# Patient Record
Sex: Male | Born: 1945
Health system: Southern US, Community
[De-identification: ages and names within clinical notes are randomized; demographics above are authoritative.]

## PROBLEM LIST (undated history)

## (undated) DIAGNOSIS — R748 Abnormal levels of other serum enzymes: Secondary | ICD-10-CM

## (undated) DIAGNOSIS — E119 Type 2 diabetes mellitus without complications: Secondary | ICD-10-CM

## (undated) DIAGNOSIS — R001 Bradycardia, unspecified: Secondary | ICD-10-CM

## (undated) DIAGNOSIS — M545 Low back pain, unspecified: Secondary | ICD-10-CM

## (undated) DIAGNOSIS — I10 Essential (primary) hypertension: Secondary | ICD-10-CM

## (undated) DIAGNOSIS — R011 Cardiac murmur, unspecified: Secondary | ICD-10-CM

## (undated) DIAGNOSIS — G8929 Other chronic pain: Secondary | ICD-10-CM

## (undated) DIAGNOSIS — I4891 Unspecified atrial fibrillation: Secondary | ICD-10-CM

## (undated) DIAGNOSIS — Z9289 Personal history of other medical treatment: Secondary | ICD-10-CM

## (undated) DIAGNOSIS — I219 Acute myocardial infarction, unspecified: Secondary | ICD-10-CM

## (undated) DIAGNOSIS — R9430 Abnormal result of cardiovascular function study, unspecified: Secondary | ICD-10-CM

## (undated) DIAGNOSIS — IMO0002 Reserved for concepts with insufficient information to code with codable children: Secondary | ICD-10-CM

## (undated) DIAGNOSIS — M1990 Unspecified osteoarthritis, unspecified site: Secondary | ICD-10-CM

## (undated) DIAGNOSIS — J3489 Other specified disorders of nose and nasal sinuses: Secondary | ICD-10-CM

## (undated) DIAGNOSIS — K589 Irritable bowel syndrome without diarrhea: Secondary | ICD-10-CM

## (undated) DIAGNOSIS — I2510 Atherosclerotic heart disease of native coronary artery without angina pectoris: Secondary | ICD-10-CM

## (undated) HISTORY — DX: Reserved for concepts with insufficient information to code with codable children: IMO0002

## (undated) HISTORY — DX: Essential (primary) hypertension: I10

## (undated) HISTORY — DX: Unspecified atrial fibrillation: I48.91

## (undated) HISTORY — PX: LAPAROSCOPIC CHOLECYSTECTOMY: SUR755

## (undated) HISTORY — PX: CATARACT EXTRACTION W/ INTRAOCULAR LENS IMPLANT: SHX1309

## (undated) HISTORY — DX: Abnormal result of cardiovascular function study, unspecified: R94.30

## (undated) HISTORY — DX: Bradycardia, unspecified: R00.1

## (undated) HISTORY — DX: Abnormal levels of other serum enzymes: R74.8

## (undated) HISTORY — DX: Unspecified osteoarthritis, unspecified site: M19.90

## (undated) HISTORY — DX: Personal history of other medical treatment: Z92.89

## (undated) HISTORY — PX: APPENDECTOMY: SHX54

## (undated) SURGERY — Surgical Case
Anesthesia: *Unknown

---

## 1998-01-10 ENCOUNTER — Inpatient Hospital Stay (HOSPITAL_COMMUNITY): Admission: EM | Admit: 1998-01-10 | Discharge: 1998-01-10 | Payer: Self-pay | Admitting: Emergency Medicine

## 1998-01-10 ENCOUNTER — Encounter: Payer: Self-pay | Admitting: Emergency Medicine

## 1998-01-29 ENCOUNTER — Encounter: Payer: Self-pay | Admitting: Cardiology

## 1998-05-14 ENCOUNTER — Emergency Department (HOSPITAL_COMMUNITY): Admission: EM | Admit: 1998-05-14 | Discharge: 1998-05-14 | Payer: Self-pay | Admitting: *Deleted

## 1998-11-28 ENCOUNTER — Emergency Department (HOSPITAL_COMMUNITY): Admission: EM | Admit: 1998-11-28 | Discharge: 1998-11-28 | Payer: Self-pay | Admitting: Emergency Medicine

## 2001-09-11 ENCOUNTER — Encounter (INDEPENDENT_AMBULATORY_CARE_PROVIDER_SITE_OTHER): Payer: Self-pay | Admitting: *Deleted

## 2001-09-11 ENCOUNTER — Observation Stay (HOSPITAL_COMMUNITY): Admission: EM | Admit: 2001-09-11 | Discharge: 2001-09-12 | Payer: Self-pay | Admitting: Emergency Medicine

## 2001-09-11 ENCOUNTER — Encounter: Payer: Self-pay | Admitting: Surgery

## 2003-04-23 ENCOUNTER — Emergency Department (HOSPITAL_COMMUNITY): Admission: EM | Admit: 2003-04-23 | Discharge: 2003-04-24 | Payer: Self-pay | Admitting: Emergency Medicine

## 2004-03-28 ENCOUNTER — Ambulatory Visit: Payer: Self-pay | Admitting: Cardiology

## 2004-04-26 ENCOUNTER — Ambulatory Visit: Payer: Self-pay | Admitting: Cardiology

## 2004-05-10 ENCOUNTER — Ambulatory Visit: Payer: Self-pay

## 2004-05-13 ENCOUNTER — Ambulatory Visit: Payer: Self-pay

## 2004-05-13 ENCOUNTER — Encounter: Payer: Self-pay | Admitting: Cardiology

## 2004-12-05 ENCOUNTER — Ambulatory Visit: Payer: Self-pay | Admitting: Cardiology

## 2005-06-12 ENCOUNTER — Ambulatory Visit: Payer: Self-pay | Admitting: Cardiology

## 2005-08-15 ENCOUNTER — Ambulatory Visit: Payer: Self-pay | Admitting: Cardiology

## 2005-11-17 ENCOUNTER — Ambulatory Visit: Payer: Self-pay

## 2005-11-28 ENCOUNTER — Ambulatory Visit: Payer: Self-pay | Admitting: Cardiology

## 2005-12-05 ENCOUNTER — Ambulatory Visit: Payer: Self-pay | Admitting: Cardiology

## 2006-02-08 ENCOUNTER — Ambulatory Visit: Payer: Self-pay | Admitting: Cardiology

## 2006-02-08 LAB — CONVERTED CEMR LAB
Calcium: 9.2 mg/dL (ref 8.4–10.5)
Chloride: 107 meq/L (ref 96–112)
Creatinine, Ser: 1 mg/dL (ref 0.4–1.5)
GFR calc non Af Amer: 81 mL/min

## 2006-08-02 ENCOUNTER — Ambulatory Visit: Payer: Self-pay | Admitting: Cardiology

## 2007-04-01 ENCOUNTER — Ambulatory Visit: Payer: Self-pay | Admitting: Cardiology

## 2007-05-13 ENCOUNTER — Ambulatory Visit: Payer: Self-pay | Admitting: Cardiology

## 2007-09-30 ENCOUNTER — Ambulatory Visit (HOSPITAL_COMMUNITY): Admission: RE | Admit: 2007-09-30 | Discharge: 2007-09-30 | Payer: Self-pay | Admitting: Surgery

## 2007-11-25 ENCOUNTER — Encounter (INDEPENDENT_AMBULATORY_CARE_PROVIDER_SITE_OTHER): Payer: Self-pay | Admitting: Surgery

## 2007-11-25 ENCOUNTER — Ambulatory Visit (HOSPITAL_COMMUNITY): Admission: RE | Admit: 2007-11-25 | Discharge: 2007-11-25 | Payer: Self-pay | Admitting: Surgery

## 2008-01-09 ENCOUNTER — Ambulatory Visit: Payer: Self-pay | Admitting: Cardiology

## 2008-04-28 LAB — HM COLONOSCOPY

## 2008-07-30 ENCOUNTER — Telehealth: Payer: Self-pay | Admitting: Cardiology

## 2008-11-18 ENCOUNTER — Encounter (INDEPENDENT_AMBULATORY_CARE_PROVIDER_SITE_OTHER): Payer: Self-pay | Admitting: *Deleted

## 2009-01-21 ENCOUNTER — Encounter: Payer: Self-pay | Admitting: Cardiology

## 2009-01-21 DIAGNOSIS — E119 Type 2 diabetes mellitus without complications: Secondary | ICD-10-CM

## 2009-01-21 DIAGNOSIS — I480 Paroxysmal atrial fibrillation: Secondary | ICD-10-CM | POA: Insufficient documentation

## 2009-01-21 DIAGNOSIS — I4891 Unspecified atrial fibrillation: Secondary | ICD-10-CM

## 2009-01-22 ENCOUNTER — Ambulatory Visit: Payer: Self-pay | Admitting: Cardiology

## 2009-01-28 ENCOUNTER — Ambulatory Visit (HOSPITAL_COMMUNITY): Admission: RE | Admit: 2009-01-28 | Discharge: 2009-01-28 | Payer: Self-pay | Admitting: Cardiology

## 2009-01-28 ENCOUNTER — Encounter: Payer: Self-pay | Admitting: Cardiology

## 2009-01-28 ENCOUNTER — Ambulatory Visit: Payer: Self-pay | Admitting: Cardiology

## 2009-01-28 ENCOUNTER — Ambulatory Visit: Payer: Self-pay

## 2009-08-08 ENCOUNTER — Encounter: Payer: Self-pay | Admitting: Cardiology

## 2009-08-12 ENCOUNTER — Encounter: Payer: Self-pay | Admitting: Cardiology

## 2009-09-22 ENCOUNTER — Encounter: Admission: RE | Admit: 2009-09-22 | Discharge: 2009-09-22 | Payer: Self-pay | Admitting: Internal Medicine

## 2010-02-01 NOTE — Assessment & Plan Note (Signed)
Summary: YEARLY/SL  Medications Added CRESTOR 10 MG TABS (ROSUVASTATIN CALCIUM) Take one tablet by mouth daily. METFORMIN HCL 500 MG TABS (METFORMIN HCL) three times a day GLIPIZIDE XL 5 MG XR24H-TAB (GLIPIZIDE) once daily ASPIRIN EC 325 MG TBEC (ASPIRIN) Take one tablet by mouth daily CELEBREX 200 MG CAPS (CELECOXIB) Take 1 tablet by mouth once a day      Allergies Added: NKDA  Visit Type:  Follow-up Primary Provider:  Ellamae Sia, MD  CC:  paroxysmal atrial fibrillation.  History of Present Illness: The patient is seen for followup of paroxysmal atrial fibrillation.  He is on flecainide.  I saw him last January, 2010.  He is doing very well.  His sister currently is hospitalized with an episode of sudden cardiac death with ventricular fibrillation converted by an AED in a shopping store.  She done extremely well.  Etiology of her event is not clear yet.  She does have coronary disease.  Patient has no significant problems.  He knows when he has his atrial fibrillation and he is felt no palpitations.  He does have problem with some pain in his shoulder.  Celebrex can be used in his case.  Current Medications (verified): 1)  Flecainide Acetate 150 Mg Tabs (Flecainide Acetate) .... One and One-Half Tablets Two Times A Day 2)  Metoprolol Succinate 50 Mg Xr24h-Tab (Metoprolol Succinate) .... Take 1 Tablet By Mouth Two Times A Day 3)  Ramipril 5 Mg Caps (Ramipril) .... Take One Capsule By Mouth Daily 4)  Crestor 10 Mg Tabs (Rosuvastatin Calcium) .... Take One Tablet By Mouth Daily. 5)  Metformin Hcl 500 Mg Tabs (Metformin Hcl) .... Three Times A Day 6)  Glipizide Xl 5 Mg Xr24h-Tab (Glipizide) .... Once Daily 7)  Aspirin 81 Mg Tbec (Aspirin) .... Take One Tablet By Mouth Daily  Allergies (verified): No Known Drug Allergies  Past History:  Past Medical History: Last updated: 01/21/2009 hypertension CPK  elevated  with normal MB and normal troponin in the past Nuclear stress  test...5/ 2006...no ischemia Systolic murmur PAF...-rare episodes-sinus rhythm on flecainide-patient prefers not to take Coumadin  PVCs....benign Diabetes cholecystectomy-2009 EF  65%...echo...2000  Review of Systems       Patient denies fever, chills, headache, sweats, rash, change in vision, change in hearing, chest pain, shortness of breath, cough, nausea or vomiting, urinary symptoms.  All other systems are reviewed and are negative.  Vital Signs:  Patient profile:   65 year old male Height:      68 inches Weight:      212 pounds BMI:     32.35 Pulse rate:   59 / minute BP sitting:   158 / 82  (left arm) Cuff size:   regular  Vitals Entered By: Hardin Negus, RMA (January 22, 2009 9:03 AM)  Physical Exam  General:  patient is quite stable and he is somewhat overweight. Head:  head is atraumatic. Eyes:  no xanthelasma. Neck:  no jugular venous distention. Chest Wall:  no chest wall tenderness. Lungs:  lungs are clear cardiovascular is not labored. Heart:  cardiac exam reveals S1-S2.  No clicks or significant murmurs. Abdomen:  abdomen is soft. Msk:  no musculoskeletal deformities. Extremities:  no peripheral edema. Skin:  no skin rashes. Psych:  patient is oriented to person time and place.  Affect is normal.   Impression & Recommendations:  Problem # 1:  * RIGHT SHOULDER ARTHRITIS I will give the patient one month of Celebrex.  I very carefully considered this.  Problem # 2:  ATRIAL FIBRILLATION, PAROXYSMAL (ICD-427.31)  His updated medication list for this problem includes:    Flecainide Acetate 150 Mg Tabs (Flecainide acetate) ..... One and one-half tablets two times a day    Metoprolol Succinate 50 Mg Xr24h-tab (Metoprolol succinate) .Marland Kitchen... Take 1 tablet by mouth two times a day    Aspirin Ec 325 Mg Tbec (Aspirin) .Marland Kitchen... Take one tablet by mouth daily  Orders: EKG w/ Interpretation (93000)  EKG is done today reviewed by me.  He has normal sinus rhythm.   The corrected QT interval is normal.  He has had no significant symptoms.  He does have a CHADS2 score of 2.  This would indicate that he should be on Coumadin.  He does physical work and he does not want to be on Coumadin.  Also at this point it appears that he is not having any paroxysms of atrial fib.  This does not change the Coumadin indication, but I feel more comfortable in his case allowing him not to be on it.  Also dabigatran is now available and I discussed this with him.  We will continue this discussion over time.  Problem # 3:  MURMUR (ICD-785.2)  His updated medication list for this problem includes:    Metoprolol Succinate 50 Mg Xr24h-tab (Metoprolol succinate) .Marland Kitchen... Take 1 tablet by mouth two times a day    Ramipril 5 Mg Caps (Ramipril) .Marland Kitchen... Take one capsule by mouth daily  There is no significant murmur today.  No further workup at this point.  Orders: Echocardiogram (Echo)  Problem # 4:  HYPERTENSION (ICD-401.9)  His updated medication list for this problem includes:    Metoprolol Succinate 50 Mg Xr24h-tab (Metoprolol succinate) .Marland Kitchen... Take 1 tablet by mouth two times a day    Ramipril 5 Mg Caps (Ramipril) .Marland Kitchen... Take one capsule by mouth daily    Aspirin Ec 325 Mg Tbec (Aspirin) .Marland Kitchen... Take one tablet by mouth daily  Systolic blood pressure slightly elevated today.  No change in therapy.  He needs blood pressure follow.  We'll see him back in one year for cardiology followup.  Flecainide level will be checked.  Problem # 5:  * SISTER WITH SUDDEN CARDIAC DEATH As outlined in the history of present illness the patient's sister recently had his episode of sudden cardiac death.  She had successful resuscitation.  She has coronary disease.  Etiology of the event is not clear.  Because the patient has this history in the family now, followup 2-D echo will be done to be sure that there has been no change in that there is no obvious sign of hypertrophic cardiomyopathy.  Patient  Instructions: 1)  Increase Aspirin to 325mg   2)  We have given you a prescription for Celebrex 200mg  daily, future refill will need to come from your primary care MD 3)  Your physician recommends that you return for lab work in: Flecanide level 427.31 4)  Your physician has requested that you have an echocardiogram.  Echocardiography is a painless test that uses sound waves to create images of your heart. It provides your doctor with information about the size and shape of your heart and how well your heart's chambers and valves are working.  This procedure takes approximately one hour. There are no restrictions for this procedure. 5)  Follow up in 1 year Prescriptions: CRESTOR 10 MG TABS (ROSUVASTATIN CALCIUM) Take one tablet by mouth daily.  #90 x 4   Entered by:   Sherri  Bascom Levels, RMA   Authorized by:   Talitha Givens, MD, [facility]   Signed by:   Hardin Negus, RMA on 01/22/2009   Method used:   Electronically to        [facility]* (mail-order)             ,          Ph: [phone]       Fax: [phone]   RxID:   [id number] RAMIPRIL 5 MG CAPS (RAMIPRIL) Take one capsule by mouth daily  #90 x 4   Entered by:   Hardin Negus, RMA   Authorized by:   Talitha Givens, MD, [facility]   Signed by:   Hardin Negus, RMA on 01/22/2009   Method used:   Electronically to        [facility]* (mail-order)             ,          Ph: [phone]       Fax: [phone]   RxID:   [id number] METOPROLOL SUCCINATE 50 MG XR24H-TAB (METOPROLOL SUCCINATE) Take 1 tablet by mouth two times a day  #180 x 4   Entered by:   Hardin Negus, RMA   Authorized by:   Talitha Givens, MD, [facility]   Signed by:   Hardin Negus, RMA on 01/22/2009   Method used:   Electronically to        [facility]* (mail-order)             ,          Ph: [phone]       Fax: [phone]   RxID:   [id number] FLECAINIDE ACETATE 150 MG TABS (FLECAINIDE ACETATE) one and one-half tablets two times a  day  #270 x 4   Entered by:   Hardin Negus, RMA   Authorized by:   Talitha Givens, MD, [facility]   Signed by:   Hardin Negus, RMA on 01/22/2009   Method used:   Electronically to        [facility]* (mail-order)             ,          Ph: [phone]       Fax: [phone]   RxID:   [id number] CELEBREX 200 MG CAPS (CELECOXIB) Take 1 tablet by mouth once a day  #30 x 0   Entered by:   Hardin Negus, RMA   Authorized by:   Talitha Givens, MD, [facility]   Signed by:   Hardin Negus, RMA on 01/22/2009   Method used:   Electronically to        [facility] (retail)       [address]       [place], [place]  [id number]       Ph: [phone]       Fax: [phone]   RxID:   [id number]

## 2010-02-01 NOTE — Miscellaneous (Signed)
  Clinical Lists Changes  Problems: Added new problem of HYPERTENSION (ICD-401.9) Added new problem of * CPK ELEVATION .Marland KitchenNORMAL TROPONIN Added new problem of MURMUR (ICD-785.2) Added new problem of ATRIAL FIBRILLATION, PAROXYSMAL (ICD-427.31) Added new problem of * PVCS Added new problem of DM (ICD-250.00) Observations: Added new observation of PAST MED HX: hypertension CPK  elevated  with normal MB and normal troponin in the past Nuclear stress test...5/ 2006...no ischemia Systolic murmur PAF...-rare episodes-sinus rhythm on flecainide-patient prefers not to take Coumadin  PVCs....benign Diabetes cholecystectomy-2009 EF  65%...echo...2000  (01/21/2009 16:14)       Past History:  Past Medical History: hypertension CPK  elevated  with normal MB and normal troponin in the past Nuclear stress test...5/ 2006...no ischemia Systolic murmur PAF...-rare episodes-sinus rhythm on flecainide-patient prefers not to take Coumadin  PVCs....benign Diabetes cholecystectomy-2009 EF  65%...echo...2000

## 2010-02-01 NOTE — Miscellaneous (Signed)
  Clinical Lists Changes  Observations: Added new observation of PAST MED HX: hypertension CPK  elevated  with normal MB and normal troponin in the past Nuclear stress test...5/ 2006...no ischemia Systolic murmur PAF...-rare episodes-sinus rhythm on flecainide-patient prefers not to take Coumadin  PVCs....benign Diabetes cholecystectomy-2009 EF  65%...echo...2000 LFTs  elevated.... August, 2011... evaluation by primary care.... Crestor held  (08/12/2009 9:06) Added new observation of PRIMARY MD: [name], MD (08/12/2009 9:06)       Past History:  Past Medical History: hypertension CPK  elevated  with normal MB and normal troponin in the past Nuclear stress test...5/ 2006...no ischemia Systolic murmur PAF...-rare episodes-sinus rhythm on flecainide-patient prefers not to take Coumadin  PVCs....benign Diabetes cholecystectomy-2009 EF  65%...echo...2000 LFTs  elevated.... August, 2011... evaluation by primary care.... Crestor held

## 2010-03-28 ENCOUNTER — Other Ambulatory Visit: Payer: Self-pay | Admitting: Cardiology

## 2010-03-31 NOTE — Telephone Encounter (Signed)
[address] °

## 2010-05-17 NOTE — Assessment & Plan Note (Signed)
[facility]                            CARDIOLOGY OFFICE NOTE   NAME:[name], [name] GREEK                       MRN:          [id number]  DATE:05/13/2007                            DOB:          05-17-1945    [name] is stable.  When I saw him last he was having some increased  episodes of atrial fib.  He is now doing much better.  I believe he does  much better when he is on Toprol as opposed to some of the other meds  that have been necessary during the shortage of longer acting beta  blockers.  He is fully active.  I am seeing him today specifically to be  sure that he is doing better, and to be sure that he is stable to make a  trip to [place] for his 40th wedding anniversary which is coming up soon.   PAST MEDICAL HISTORY:   ALLERGIES:  No known drug allergies.   MEDICATIONS:  1. Aspirin.  2. Omega-3.  3. Metformin.  4. Flecainide.  5. Ramipril.  6. Toprol XL.  7. Crestor.   OTHER MEDICAL PROBLEMS:  See the list below.   REVIEW OF SYSTEMS:  He is really doing well.  His review of systems is  negative.   PHYSICAL EXAMINATION:  Blood pressure is 145/84 with a pulse of 60.  The patient is oriented to person, time, and place.  Affect is normal.  HEENT:  Reveals no xanthelasma.  He has normal extraocular  motion.  There are no carotid bruits.  There is no jugular venous distension.  LUNGS:  Clear.  Respiratory effort is not labored.  CARDIAC EXAM:  Reveals an S1 with an S2.  There are no clicks or  significant murmurs.  The abdomen is soft.  He has no peripheral edema.   [name] is stable.  His paroxysmal atrial fibrillation is stable.  No  change in his meds at this time, and I will see him in 6 months.     [name] Bjornstad, MD, [facility]  Electronically Signed    JDK/MedQ  DD: 05/13/2007  DT: 05/13/2007  Job #: [id number]   cc:   Linton Ham. Laney Pastor, M.D.

## 2010-05-17 NOTE — Assessment & Plan Note (Signed)
[name] OFFICE NOTE   NAME:[name], [name] WAHLEN                       MRN:          [id number]  DATE:01/09/2008                            DOB:          [birth year]    [name] is doing well.  I follow him for atrial fib.  I saw him last  in May 2009.  He remains on flecainide.  We have checked his levels over  time and he is well within the therapeutic range.  There is no need to  repeat his study now.  He has not had any significant recurrent  symptoms.   He had his gallbladder removed with a laparoscopic cholecystectomy.  He  feels better in general, but still has some symptoms postsurgery related  to the surgical procedure.  Over time, this will improve.   PAST MEDICAL HISTORY:   ALLERGIES:  No known drug allergies.   MEDICATIONS:  See the flow sheet.   REVIEW OF SYSTEMS:  He is not having any GU symptoms.  He has no fevers,  chills, headaches.  His review of systems otherwise is negative.   PHYSICAL EXAMINATION:  VITAL SIGNS:  Blood pressure 140/86 with a pulse  of 53.  GENERAL:  The patient is oriented to person, time, and place.  Affect is  normal.  HEENT:  No xanthelasma.  He has normal extraocular motion.  NECK:  There are no carotid bruits.  There is no jugular venous  distention.  LUNGS:  Clear.  Respiratory effort is not labored.  CARDIAC:  S1 with an S2.  There are no clicks or significant murmurs.  ABDOMEN:  Soft and recovering from his cholecystectomy.  EXTREMITIES:  He has no peripheral edema.   EKG is done and reviewed by me.  He has sinus rhythm with sinus  bradycardia.   Problems are listed on the prior notes.  He has paroxysmal atrial  fibrillation and is stable on flecainide dosing.  No change in his meds  at this time.  I will see him back in 1 year.     [name], MD, [facility]  Electronically Signed    JDK/MedQ  DD: 01/09/2008  DT: 01/10/2008  Job #: [id number]   cc:    Linton Ham. Laney Pastor, M.D.

## 2010-05-17 NOTE — Op Note (Signed)
NAME:  [name], [name] NO.:  [id number]   MEDICAL RECORD NO.:  [id number]          PATIENT TYPE:  AMB   LOCATION:  DAY                          FACILITY:  [facility]   PHYSICIAN:  Adin Hector, MD     DATE OF BIRTH:  02/02/1945   DATE OF PROCEDURE:  DATE OF DISCHARGE:                               OPERATIVE REPORT   PRIMARY CARE PHYSICIAN:  [name] Fuss, PA-C at [facility].   SURGEON:  Michael Boston, M.D.   ASSISTANT:  Edsel Petrin. Dalbert Batman, M.D.   PREOPERATIVE DIAGNOSES:  1. Symptomatic cholecystolithiasis, possible chronic cholecystitis.  2. Left upper eyelid skin masses x3.  3. Fatty steatohepatosis.   POSTOPERATIVE DIAGNOSIS:  1. Symptomatic cholecystolithiasis, possible chronic cholecystitis.  2. Left upper eyelid skin masses x3.  3. Fatty steatohepatosis.   PROCEDURES PERFORMED:  1. Laparoscopic cholecystectomy.  2. Removal of upper eyelid skin masses x3.   ANESTHESIA:  1. General anesthesia.  2. Local anesthetic and field block around all port sites.   DRAINS:  None.   ESTIMATED BLOOD LOSS:  Less than 10 mL.   COMPLICATIONS:  None apparent.   SPECIMENS:  Normal gallbladder.   INDICATIONS:  [name] is a 65 year old with intermittent bouts of  abdominal pain.  It has been associated with eating.  His evaluation did  show some gallstones, but I was not able to review the film itself.  His  potential diagnosis is not initially suggestive of biliary colic, but  then it still seemed more consistent being postprandial.  His workup was  otherwise negative.  We did a HIDA scan which showed markedly decreased  gallbladder ejection fraction with reproduction of symptoms.  Based on  that, I thought he may benefit from cholecystectomy and offered this to  him.   Anatomy and physiology of hepatobiliary and pancreatic function was  discussed. Pathophysiology of symptomatic cholecystolithiasis with its  natural history risks were  discussed.  I have discussed recommendations  made for laparoscopic cholecystectomy, risks, benefits and alternates.  Questions were answered and he agreed to proceed.  The patient also  noted he had some skin tags on his left eyelid.  They have been getting  bigger in size and bothersome when he tries to open and close his eyes.  He requests excision and I thought this was reasonable.  Technique of  excision was discussed.  Risk, benefits and alternatives discussed and  he agreed to proceed.   OPERATIVE FINDINGS:  His gallbladder wall did not seem particularly  thickened.  He did have some dark flat stones, more consistent with  hemolytic type stones than the classic chalky cholesterol stones.  The  upper eyelid mass is seen most consistent with skin tags (acrochordons).   DESCRIPTION OF PROCEDURE:  Informed consent was confirmed.  The patient  had voided just prior to going to the operating room.  He had sequential  compression devices active during the entire case.  He was positioned  supine with both arms tucked and foot board in place.  He underwent  general anesthesia without any difficulty.  His abdomen was prepped and  draped in a sterile fashion.   A 5 mm port was placed in the right upper quadrant with the patient in  steep reverse Trendelenburg and right-side up.  Camera inspection  revealed no intra-abdominal injury.  Under direct visualization, final  ports were placed in the right flank and through the umbilicus.  An 10  mm port was tunneled through the falciform ligament in the subxiphoid  region.  Camera inspection revealed no intra-abdominal injury.  There  was no strong evidence of any periumbilical hernia as thought possible  on initial physical exam.   The gallbladder fundus was grasped and elevated cephalad.  The liver was  somewhat fatty, but I would not say that it had true cirrhosis.  His  omentum was fatty as well.  It limited our view somewhat.  The patient   was placed in heavier reversed Trendelenburg.  The peritoneum was scored  on the right side of the gallbladder.  This area with scored between the  liver and the peritoneal coverings between the right and left side of  the gallbladder.  During this, a breach was made in the wall gallbladder  medially and there was spillage of bile.  This was aspirated and  controlled.  Another tear was made more at the fundus trying to help  elevate the gallbladder and this was aspirated as well.   Circumferential dissection was done to free the proximal third of the  gallbladder off the liver bed.  I saw excellent candidate for the  anterior branch of the cystic artery.  I put one clip on the gallbladder  side and two clips on the proximal remaining after a good critical view  was noted and this was transected.  There was a small posterior branch  of no significance and that was easily controlled with cautery.  This  left one structure going from the gallbladder infundibulum to the  gallbladder down to the porta.  However, lifting and coming around the  infundibulum there was a tear in the infundibulum and it actually fell  off the cystic duct.  There was spillage of stones.  These were  aspirated with a stone grasper.  Copious irrigation was done with good  removal of bile.   Because he did not have any symptoms of jaundice his liver function  tests were not elevated, and it would be harder to do it through a free-  floating cystic duct stump at this time; Dr. Dalbert Batman & I agreed not to be  more aggressive with the cholangiogram. Therefore,  the cystic stump was  grasped and elevated.  It was ligated using a 0-PDS Endoloop and clips  times three.   The gallbladder was freed from its main attachments on liver bed and  removed inside an EndoCatch bag requiring minimal dilation.  The fascial  defect in the subxiphoid region was too small for my pinkie to pass so I  did not do any more aggressive  closure.   Copious irrigation was done with over 2 liters of saline with clear  return.  Hemostasis was excellent.  The clips were intact on the cystic  duct and arterial stumps.  The capnoperitoneum was evacuated and the  ports were removed.  Again, the fascial defect was small in the  subxiphoid region so I did not more aggressively close it.  The skin was  closed using 4-0 Monocryl stitch.  Sterile dressings were applied.   Attention was turned towards  the left eyelid.  The left eyelid was  prepped with alcohol swabs and draped in a sterile fashion.  The skin  tags were elevated and transected using a 15 blade scalpel as the masses  were very pedunculated.  The smallest  mass could be controlled with  just one touch cautery.  The two small lesions I closed using a 5-0  Prolene interrupted stitch for each site to good result.  Care was made  to justify the skin.  Pressure was held and hemostasis was excellent.  A  cold compress was placed.   The patient was extubated and sent to the recovery room in stable  condition.  I am about to discuss postoperative findings with the  patient's family.      Adin Hector, MD  Electronically Signed     SCG/MEDQ  D:  11/25/2007  T:  11/25/2007  Job:  [id number]   cc:   Tommas Olp  [facility]

## 2010-05-17 NOTE — Assessment & Plan Note (Signed)
[place] OFFICE NOTE   NAME:[name], [name] HANF                       MRN:          [id number]  DATE:08/02/2006                            DOB:          02-Dec-1945    [name] is here for followup.  He has had atrial fibrillation in the  past.  We have carefully made decisions about whether or not he can be  on Coumadin.  Because of the type of work he does, we have followed him  off of Coumadin.  He is on aspirin.  He feels quite well.  He has not  had any significant palpitations.  We had switched him off of Altace to  Lotensin, and he felt poorly with it and stopped it.  We will restart  his Altace for blood pressure control.  At night, his blood pressure is  controlled, but during a busy workday, it is probably elevated as it was  when he got here today.   ALLERGIES:  NO KNOWN DRUG ALLERGIES.   MEDICATIONS:  1. Toprol-XL 50 mg daily.  2. Aspirin 325 mg.  3. Omega-3 fish oil.  4. Vitamins.  5. Flecainide 225 mg p.o. b.i.d.  His Flecainide level has been good      in the past on this dose.   OTHER MEDICAL PROBLEMS:  See the list below.   REVIEW OF SYSTEMS:  Other than feeling poorly when he takes lisinopril,  he is doing very well.  Review of systems otherwise is negative.   PHYSICAL EXAMINATION:  VITAL SIGNS:  Blood pressure 158/86, pulse 64.  His weight is 214 pounds.  He rushed over here from work this afternoon.  HEENT:  There is no xanthelasma.  He has normal extraocular motion.  NECK:  There are no carotid bruits.  There is no jugular venous  distension.  LUNGS:  Clear.  Respiratory effort is not labored.  CARDIAC:  S1 with an S2.  There are no clicks or significant murmurs.  ABDOMEN:  Soft.  EXTREMITIES:  He has no peripheral edema.   LABORATORY DATA:  No labs are done today.   PROBLEMS:  1. Rare paroxysmal atrial fibrillation, stable.  2. Flecainide dosing, and this is stable.  3.  Scattered premature ventricular contractions historically, and this      is stable.  4. Nauseated feeling from Lotensin but able to take ramipril in the      past, and we will restart him on ramipril.  5. Elevated blood pressure.  Hopefully his blood pressure will come      under appropriate control      with the Altace.  We will obtain a fasting lipid profile copy from      his other physicians.  In the past he has had difficulties with      medications, but this needs to be reconsidered.     [name] Bjornstad, MD, [facility]  Electronically Signed    JDK/MedQ  DD: 08/02/2006  DT: 08/03/2006  Job #: [id number]   cc:   [facility]

## 2010-05-17 NOTE — Assessment & Plan Note (Signed)
[place] OFFICE NOTE   NAME:[name], [name]                       MRN:          [id number]  DATE:04/01/2007                            DOB:          1945/08/05    Mr. Wilczak is here for followup.  He does have paroxysmal atrial  fibrillation.  Recently his sugar has been elevated.  He is taking  Crestor.  This has helped his lipids, but it was noted that his  hemoglobin A1c was higher.  He is beginning to lose some weight.  He had  some mild increase in palpitations, but this is stabilizing.   PAST MEDICAL HISTORY:   ALLERGIES:  No known drug allergies.   MEDICATIONS:  Aspirin, Omega 3, vitamin B12, glyburide, metformin,  flecainide, ramipril, Toprol, and Crestor.   OTHER MEDICAL PROBLEMS:  See the list below.   REVIEW OF SYSTEMS:  He is feeling better and has no complaints today.   PHYSICAL EXAMINATION:  VITAL SIGNS:  Blood pressure is 140/85 with a  pulse of 63.  GENERAL:  The patient is oriented to person, time and place.  Affect is  normal.  HEENT:  Reveals no xanthelasma.  He has normal extraocular motion.  NECK:  There are no carotid bruits.  There is no jugular venous  distention.  LUNGS:  Clear.  Respiratory effort is not labored.  CARDIAC:  Exam reveals an S1 with an S2.  There are no clicks or  significant murmurs.  ABDOMEN:  Obese.  He definitely needs to lose weight.  EXTREMITIES:  There is no peripheral edema.   PROBLEMS:  1. Rare paroxysmal atrial fibrillation.  2. Flecainide dosing.  3. Scattered PVCs.  4. Hypertension, being treated.  5. Diabetes.   Over time, he has not been willing to take Coumadin.  I have always been  uncomfortable about this, but with the type of work he does and the  infrequent episodes of atrial fibrillation, I am  hopeful that he will be stable.  I will see him for followup before a  goes away on his trip to be sure that he is stable.     [name], MD, [facility]  Electronically Signed    JDK/MedQ  DD: 04/01/2007  DT: 04/01/2007  Job #: [id number]   cc:   Dr. Laney Pastor, [facility], [place]

## 2010-05-20 NOTE — Assessment & Plan Note (Signed)
[place] OFFICE NOTE   NAME:[name], [name]                       MRN:          [id number]  DATE:08/15/2005                            DOB:          12-31-1945    Mr. Rodelo is feeling very well.  We had increased his flecainide.  His level  had been at 0.12.  Increased him from 200 mg a day to 250, and the level  increased to 0.31, and we then increased him to 150 b.i.d.  I have not  repeated a level at 150 b.i.d.  He feels great and has only very rare  palpitations.  He is going about full activities.  He has no chest pain or  shortness of breath.   He asked me about SBE prophylaxis going forward, and the criteria have  changed, and he no longer needs antibiotics.   ALLERGIES:  No known drug allergies.   MEDICATIONS:  Toprol XL 50 b.i.d., Altace 2.5 b.i.d., aspirin 325, omega-3,  simvastatin and flecainide 150 b.i.d.   OTHER MEDICAL PROBLEMS:  See the complete list of my note of 06/12/2005.   REVIEW OF SYSTEMS:  He is really feeling well, and his review of systems is  negative.   PHYSICAL EXAMINATION:  VITAL SIGNS:  Blood pressure is 130/80.  Pulse is 55.  LUNGS:  Clear.  Respiratory effort is not labored.  HEENT:  No xanthelasmas.  He has normal extraocular motion.  There are no  carotid bruits.  There is no jugular venous distention.  CARDIAC:  S1 with an S2.  He does have a soft systolic murmur.  ABDOMEN:  Soft.  There are no masses or bruits.   Problems are listed in my note 06/12/2005.  1. Systolic murmur.  Over the next year or so we will repeat an echo.  He      had aortic sclerosis that was mild in 2000.  6/7.  Paroxysmal atrial fibrillation and flecainide use.  He is now at 150  b.i.d.  After several months I will see him back, and we will check another  level.  I will see him back for followup.  He is doing very well.                               [name], MD, [facility]    JDK/MedQ  DD:  08/15/2005  DT:  08/16/2005  Job #:  [id number]

## 2010-05-20 NOTE — Assessment & Plan Note (Signed)
[place] OFFICE NOTE   NAME:[name], [name]                       MRN:          [id number]  DATE:11/28/2005                            DOB:          [birth year]    [name] had had some increased palpitations.  We decided to recheck  flecainide level and it was 0.55 and on that basis I had increased his  dose.  He is now up to an equivalent of 450 mg a day.  I reviewed this  carefully with several of our team members.  As long as his level stays  in the therapeutic range this is safe.   On the higher dose of flecainide, he feels the best he has felt in  years.  He has no palpitations.  Some of his recent symptoms were around  the time that he had a very painful flu shot.  It is possible that we  may over time back the dose down.   ALLERGIES:  No known drug allergies.   MEDICATIONS:  1. Toprol XL 50 b.i.d.  2. Altace (to be changed to Lotensin) 20 mg daily.  3. Aspirin 325 mg.  4. Omega 3.  5. Glyburide.  6. Metformin.  7. Flecainide 225 mg b.i.d.   PAST MEDICAL HISTORY:  See the list below.   REVIEW OF SYSTEMS:  The patient has no complaints today and he is doing  well.  His review of systems is negative.   PHYSICAL EXAMINATION:  Weight is up to 216 pounds.  We will talk to him  about this.  Blood pressure is 140/75 with a pulse of  53.  The patient  is oriented to person, time and place and his affect was normal.  HEENT  reveals no xanthelasma.  He has normal extraocular motion.  He has no  jugular venous distention.  There are no carotid bruits.  Lungs are  clear.  Respiratory effort is not labored.  Cardiac exam reveals an S1-  S2.  There are no clicks or significant murmurs.  His abdomen is soft.  He has no masses or bruits.  He has no significant peripheral edema.  He  has good distal pulses.   PROBLEMS:  1. History of mild hypertension:  His blood pressure is top normal and      we will  continue to follow this.  2. History of elevated CPK with a normal MB and normal troponin in the      past.  3. Soft systolic murmur:  This is not heard well today.  4. Questionable history of rheumatic fever in the past.  5. Aortic valve sclerosis.  6. Paroxysm atrial fibrillation.  7. Flecainide dosing:  After very careful consideration and much      review, his ongoing dose will be 1-1/2 tablets 150 mg (225 mg)      b.i.d.  We will obtain a trough level and then we will be in touch      with him.  8. History of surgery for gangrenous appendix in October of 2003.  9. Lipid  abnormalities:  He did not feel well on Simvastatin.  He is      going to have follow-up labs.  10.Diabetes with good treatment.     [name], MD, [facility]  Electronically Signed    JDK/MedQ  DD: 11/28/2005  DT: 11/28/2005  Job #: [id number]   cc:   Laney Pastor, M.D.

## 2010-05-20 NOTE — Op Note (Signed)
TNAMEJAVI, BOLLMAN                         ACCOUNT NO.:  [id number]   MEDICAL RECORD NO.:  [id number]                   PATIENT TYPE:  OBV   LOCATION:  0101                                 FACILITY:  [facility]   PHYSICIAN:  Haywood Lasso, M.D.           DATE OF BIRTH:  14-Oct-1945   DATE OF PROCEDURE:  09/11/2001  DATE OF DISCHARGE:                                 OPERATIVE REPORT   PREOPERATIVE DIAGNOSES:  Acute appendicitis.   POSTOPERATIVE DIAGNOSES:  Acute appendicitis.   OPERATION:  Laparoscopic appendectomy.   SURGEON:  Haywood Lasso, M.D.   ANESTHESIA:  General endotracheal.   CLINICAL HISTORY:  This patient is a 65 year old with a fairly typical  history for acute appendicitis who had a CT scan confirming the diagnosis.   DESCRIPTION OF PROCEDURE:  The patient was seen in the holding area and had  no further questions. He was taken to the operating room and after  satisfactory general endotracheal anesthesia had been obtained, a Foley  catheter was placed. The abdomen was shaved, prepped and draped. I used  0.25% Marcaine for each incision. An umbilical incision was made, the fascia  opened and the peritoneal cavity entered under direct vision. A pursestring  was placed and the Hasson introduced. The abdomen was insufflated to15. The  camera was placed and a 5 mm trocar was placed under direct vision in the  right upper quadrant and a 10/11 in the left lower quadrant. The appendix  was visualized, it was stuck distally somewhat laterally and I could not  really mobilize this up very well but the base of the appendix was readily  visualized. I was able to grasp the appendix just distal to the base and  using the wide scalpel make a small window in the mesentery and then get a  window through the mesentery. At this point, I put the EndoGIA in and  divided the appendix right at its base.   Then using the cutting the appendix as a handle, I was then able to  divide  the mesoappendix with the harmonic scalpel and this went very nicely. We got  the appendix completely freed up and there was on bleeding. The appendix was  placed in the EndoCatch bag and brought out the umbilical port.   The abdomen was resinsufflated, we did a final irrigation, checked for  hemostasis and a final check of the staple line and everything looked okay.  A 5 mm trocar was removed and that site was dry. The 10/11 in the left lower  quadrant  was removed and that site was dried. The umbilical port was removed and the  abdomen deflated. The pursestring was tied down. The skin was closed with 4-  0 Monocryl subcuticular plus Steri-Strips. The patient tolerated the  procedure well. There were no operative complications and all counts were  correct.  Haywood Lasso, M.D.    CJS/MEDQ  D:  09/11/2001  T:  09/12/2001  Job:  [id number]   cc:   [facility], M.D. [facility]

## 2010-05-20 NOTE — Assessment & Plan Note (Signed)
[place] OFFICE NOTE   NAME:[name], [name]                       MRN:          [id number]  DATE:02/08/2006                            DOB:          11/05/1945    [name] has had some palpitations and he called earlier this week.  [name] has had some paroxysmal atrial fibrillation in the past and he  is on Flecainide.  We had increased his dose over time up to 225 mg  b.i.d.  A level had been checked at 0.55 and a level was checked after  the dose increase and it was 0.41.  He has had some mild palpitations,  but it does not sound like bursts of atrial fibrillation.  He has not  had any syncope or presyncope.  There has been no chest pain and no  shortness of breath and he remains quite active.  We decided to bring  him into the office today for further evaluation.  He had a Flecainide  level drawn today to be sure.  It is still pending.   The symptoms that he has had recently occurred mostly at rest and he  feels a brief irregularity which sounds like sensing of his PVCs and  post PVC beats.   ALLERGIES:  No known drug allergies.   MEDICATIONS:  Metoprolol extended release 50 b.i.d., aspirin 325 mg,  Omega-3, Glyburide/Metformin, Flecainide 225 b.i.d., Lisinopril 20.   OTHER MEDICAL PROBLEMS:  See the list on my note of November 28, 2005.   REVIEW OF SYSTEMS:  Other than some mild palpitations, he is feeling  well and he feels better now.   PHYSICAL EXAMINATION:  VITAL SIGNS:  Weight 212 pounds, blood pressure  150/83 with a pulse of 62.  GENERAL:  The patient is oriented to person, time, and place, and affect  is normal.  HEENT:  There is no xanthelasma.  There is normal extraocular motion.  NECK:  There is no jugular venous distention.  He has no carotid bruits.  LUNGS:  Clear.  Respiratory effort is not labored.  CARDIAC:  Exam reveals S1 and S2.  There are no clicks or significant  murmurs.  ABDOMEN:  Soft.  There are no masses or bruits.  EXTREMITIES:  He has no peripheral edema.   EKG done on February 05, 2006, showed normal QT interval.  There are  scattered PVCs.  There is sinus rhythm.   The problems are listed on my prior note of November 28, 2005.  1. Paroxysmal atrial fibrillation, this is stable.  2. Flecainide dosing, this is stable and a level is checked today.  3. Scattered PVCs.  This is kept in mind.  The patient is reassured.      I explained to him how he      could feel post PVC beats.  He feels fine at this time and he will      go back to full activity and I will see him back for follow up.     [name], MD, [facility]  Electronically Signed  JDK/MedQ  DD: 02/08/2006  DT: 02/08/2006  Job #: [id number]   cc:   [facility]

## 2010-06-03 ENCOUNTER — Other Ambulatory Visit: Payer: Self-pay | Admitting: Cardiology

## 2010-10-04 LAB — GLUCOSE, CAPILLARY
Glucose-Capillary: 205 — ABNORMAL HIGH
Glucose-Capillary: 218 — ABNORMAL HIGH

## 2010-10-04 LAB — BASIC METABOLIC PANEL
CO2: 27
Chloride: 106
GFR calc Af Amer: >60
Potassium: 4.8
Sodium: 141

## 2010-10-04 LAB — HEMOGLOBIN AND HEMATOCRIT, BLOOD
HCT: 44.6
Hemoglobin: 15.2

## 2010-10-10 ENCOUNTER — Other Ambulatory Visit: Payer: Self-pay | Admitting: *Deleted

## 2010-10-10 MED ORDER — RAMIPRIL 5 MG PO CAPS: 5.0000 mg | ORAL_CAPSULE | Freq: Every day | ORAL | Status: DC

## 2010-10-10 MED ORDER — METOPROLOL SUCCINATE ER 50 MG PO TB24: 50.0000 mg | ORAL_TABLET | Freq: Two times a day (BID) | ORAL | Status: DC

## 2011-01-03 ENCOUNTER — Other Ambulatory Visit: Payer: Self-pay | Admitting: Cardiology

## 2011-01-13 DIAGNOSIS — R197 Diarrhea, unspecified: Secondary | ICD-10-CM | POA: Diagnosis not present

## 2011-01-16 ENCOUNTER — Ambulatory Visit (INDEPENDENT_AMBULATORY_CARE_PROVIDER_SITE_OTHER): Payer: Medicare Other

## 2011-01-16 DIAGNOSIS — Z23 Encounter for immunization: Secondary | ICD-10-CM | POA: Diagnosis not present

## 2011-01-16 DIAGNOSIS — Z0289 Encounter for other administrative examinations: Secondary | ICD-10-CM | POA: Diagnosis not present

## 2011-01-16 DIAGNOSIS — I10 Essential (primary) hypertension: Secondary | ICD-10-CM

## 2011-01-16 DIAGNOSIS — IMO0001 Reserved for inherently not codable concepts without codable children: Secondary | ICD-10-CM | POA: Diagnosis not present

## 2011-03-04 ENCOUNTER — Telehealth: Payer: Self-pay

## 2011-03-04 NOTE — Telephone Encounter (Signed)
Pt would like to know if we can call him in an anti-inflamitory for his tendonitis.

## 2011-03-05 NOTE — Telephone Encounter (Signed)
PT STATES HE HAS BEEN SEEN FOR THIS ISSUE AND HE WOULD LIKE SOMETHING LIKE CELEBREX. CHART AT [place] [id number]

## 2011-03-05 NOTE — Telephone Encounter (Signed)
LMOM TO CB. NEED TO ASK PT HAVE WE SEEN HIM FOR THIS PROBLEM?

## 2011-03-06 ENCOUNTER — Other Ambulatory Visit: Payer: Self-pay | Admitting: Internal Medicine

## 2011-03-06 MED ORDER — CELECOXIB 200 MG PO CAPS: 200.0000 mg | ORAL_CAPSULE | Freq: Every day | ORAL | Status: AC

## 2011-03-06 NOTE — Telephone Encounter (Signed)
Chart to Dr. Laney Pastor with note for disposition.

## 2011-03-07 NOTE — Telephone Encounter (Signed)
Dr Laney Pastor, it looks like Judson Roch meant for this phone message to be routed to you

## 2011-03-07 NOTE — Telephone Encounter (Signed)
Dr Laney Pastor had taken care of sending in the Rx and asked for pt to be called to notify. LMOM on cell that Rx was sent in.

## 2011-03-10 ENCOUNTER — Telehealth: Payer: Self-pay

## 2011-03-10 DIAGNOSIS — M7070 Other bursitis of hip, unspecified hip: Secondary | ICD-10-CM

## 2011-03-10 DIAGNOSIS — N400 Enlarged prostate without lower urinary tract symptoms: Secondary | ICD-10-CM

## 2011-03-10 DIAGNOSIS — K589 Irritable bowel syndrome without diarrhea: Secondary | ICD-10-CM

## 2011-03-10 DIAGNOSIS — E663 Overweight: Secondary | ICD-10-CM

## 2011-03-10 NOTE — Telephone Encounter (Signed)
Pt would like Dr Laney Pastor to know the rx diphenoxylateatropine has 'given him his life back' and it is a 'miracle drug'!! Pt is having regular bm's during the day (once a.m. And once p.m.) and is not having to stop multiple times during the work day to find a restroom. He is so happy about this outcome!!   Pt would like Dr Laney Pastor to know that he has changed the rx dosage from the recommended 3xdaily to only 1x daily. He says the 3x daily was too much and he finds that just once a day regulates him and has improved his quality of life tremendously. He would like to continue on this rx at a dosage of once daily.  He is so thankful for what this has done for him.  pts best: [phone] or [phone] Pharmacy: [facility] [address] [facility]

## 2011-03-11 DIAGNOSIS — E663 Overweight: Secondary | ICD-10-CM | POA: Insufficient documentation

## 2011-03-11 DIAGNOSIS — M7070 Other bursitis of hip, unspecified hip: Secondary | ICD-10-CM | POA: Insufficient documentation

## 2011-03-11 DIAGNOSIS — K589 Irritable bowel syndrome without diarrhea: Secondary | ICD-10-CM | POA: Insufficient documentation

## 2011-03-11 DIAGNOSIS — N400 Enlarged prostate without lower urinary tract symptoms: Secondary | ICD-10-CM | POA: Insufficient documentation

## 2011-03-11 MED ORDER — DIPHENOXYLATE-ATROPINE 2.5-0.025 MG PO TABS: 1.0000 | ORAL_TABLET | Freq: Every day | ORAL | Status: AC

## 2011-03-11 NOTE — Telephone Encounter (Signed)
Called in Lomotil to [facility] [address], pt notified

## 2011-03-11 NOTE — Telephone Encounter (Signed)
Ordered lomotil since controls IBS

## 2011-03-11 NOTE — Telephone Encounter (Signed)
Spoke with [name] and she verified name and DOB. Chart in your box

## 2011-03-11 NOTE — Telephone Encounter (Signed)
Looks like the wrong bob Lablanc to me

## 2011-03-13 DIAGNOSIS — J019 Acute sinusitis, unspecified: Secondary | ICD-10-CM | POA: Diagnosis not present

## 2011-03-21 DIAGNOSIS — J019 Acute sinusitis, unspecified: Secondary | ICD-10-CM | POA: Diagnosis not present

## 2011-04-05 ENCOUNTER — Encounter: Payer: Self-pay | Admitting: Cardiology

## 2011-04-05 DIAGNOSIS — I10 Essential (primary) hypertension: Secondary | ICD-10-CM | POA: Insufficient documentation

## 2011-04-05 DIAGNOSIS — R9430 Abnormal result of cardiovascular function study, unspecified: Secondary | ICD-10-CM | POA: Insufficient documentation

## 2011-04-05 DIAGNOSIS — R748 Abnormal levels of other serum enzymes: Secondary | ICD-10-CM | POA: Insufficient documentation

## 2011-04-06 ENCOUNTER — Ambulatory Visit (INDEPENDENT_AMBULATORY_CARE_PROVIDER_SITE_OTHER): Payer: Medicare Other | Admitting: Cardiology

## 2011-04-06 ENCOUNTER — Encounter: Payer: Self-pay | Admitting: Cardiology

## 2011-04-06 VITALS — BP 146/70 | HR 49 | Resp 18 | Ht 68.0 in | Wt 205.1 lb

## 2011-04-06 DIAGNOSIS — I10 Essential (primary) hypertension: Secondary | ICD-10-CM

## 2011-04-06 DIAGNOSIS — I498 Other specified cardiac arrhythmias: Secondary | ICD-10-CM

## 2011-04-06 DIAGNOSIS — I4891 Unspecified atrial fibrillation: Secondary | ICD-10-CM | POA: Diagnosis not present

## 2011-04-06 DIAGNOSIS — Z5189 Encounter for other specified aftercare: Secondary | ICD-10-CM | POA: Diagnosis not present

## 2011-04-06 DIAGNOSIS — Z79899 Other long term (current) drug therapy: Secondary | ICD-10-CM

## 2011-04-06 DIAGNOSIS — R079 Chest pain, unspecified: Secondary | ICD-10-CM

## 2011-04-06 DIAGNOSIS — R001 Bradycardia, unspecified: Secondary | ICD-10-CM

## 2011-04-06 MED ORDER — METOPROLOL SUCCINATE ER 50 MG PO TB24: 50.0000 mg | ORAL_TABLET | Freq: Every day | ORAL | Status: DC

## 2011-04-06 NOTE — Progress Notes (Signed)
   HPI Patient is seen in followup atrial fibrillation. He is not having any significant symptoms. He has noted that his resting heart rate is on the slow side. He has not had syncope or presyncope. He takes flecainide for his atrial fib. Over time we have chosen not to use Coumadin because of his work and because he does not have a particularly high risk score. We will consider this over time.  When I saw him last year there was question of a soft murmur. Decision was made to proceed with 2-D echo. The study showed good left jugular function. There were no significant valvular abnormalities.  No Known Allergies  Current Outpatient Prescriptions  Medication Sig Dispense Refill  . flecainide (TAMBOCOR) 150 MG tablet take 1 and 1/2 tablets by mouth twice a day  90 tablet  6  . metoprolol (TOPROL-XL) 50 MG 24 hr tablet Take 1 tablet (50 mg total) by mouth 2 (two) times daily.  60 tablet  6  . ramipril (ALTACE) 5 MG capsule Take 1 capsule (5 mg total) by mouth daily.  30 capsule  5  . celecoxib (CELEBREX) 200 MG capsule Take 1 capsule (200 mg total) by mouth daily.  30 capsule  0    History   Social History  . Marital Status: Married    Spouse Name: N/A    Number of Children: N/A  . Years of Education: N/A   Occupational History  . Not on file.   Social History Main Topics  . Smoking status: Never Smoker   . Smokeless tobacco: Not on file  . Alcohol Use: Not on file  . Drug Use: Not on file  . Sexually Active: Not on file   Other Topics Concern  . Not on file   Social History Narrative  . No narrative on file    No family history on file.  Past Medical History  Diagnosis Date  . Hypertension   . Elevated CPK     CPK elevated with normal MB and normal troponin the past  . Chest pain     Nuclear, 2006, no ischemia  . Atrial fibrillation     Paroxysmal, rare episodes, sinus rhythm on flecainide, patient prefers not to take Coumadin  . Arthritis     Right shoulder  .  Ejection fraction     EF 55-60%,  echo, January, 2011    No past surgical history on file.  ROS Patient denies fever, chills, headache, sweats, rash,Change in vision, change in hearing, chest pain, cough, nausea vomiting, urinary symptoms. He has some sinus symptoms and is following with his primary M.D.  PHYSICAL EXAM Patient is oriented to person time and place. Affect is normal. Head is atraumatic. There is no jugulovenous distention. There are no carotid bruits. Lungs are clear. Respiratory effort is nonlabored. Cardiac exam reveals S1 and S2. There no clicks or significant murmurs. The abdomen is soft. There is no peripheral edema.There no musculoskeletal deformities. There are no skin rashes.  Filed Vitals:   04/06/11 1640  BP: 146/70  Pulse: 49  Resp: 18  Height: 5' 8"  (1.727 m)  Weight: 205 lb 1.9 oz (93.042 kg)   EKG is done today and reviewed by me. There is sinus bradycardia.  ASSESSMENT & PLAN

## 2011-04-06 NOTE — Assessment & Plan Note (Signed)
Patient has not had any recurrent chest pain. No change in therapy.

## 2011-04-06 NOTE — Patient Instructions (Signed)
Your physician wants you to follow-up in: 1 year.   You will receive a reminder letter in the mail two months in advance. If you don't receive a letter, please call our office to schedule the follow-up appointment.  Your physician has recommended you make the following change in your medication: Decrease your Metoprolol to once daily

## 2011-04-06 NOTE — Assessment & Plan Note (Signed)
The patient has resting sinus bradycardia. He is on a beta blocker. We will decrease the beta blocker dose. I will see him back in one year.

## 2011-04-06 NOTE — Assessment & Plan Note (Addendum)
The patient has essentially no significant symptoms. He tolerates Flecainide very well. We will continue To not use Coumadin at this time. A flecainide level will be checked.The patient does have a  CHADS score of 2. He prefers not to be treated with an anticoagulant because of his work. I have agreed to this only because clinically he appears to have very infrequent brief episodes of atrial fibrillation that he can feel.

## 2011-04-06 NOTE — Assessment & Plan Note (Signed)
Blood pressure is controlled. No change in therapy. 

## 2011-04-07 DIAGNOSIS — M25519 Pain in unspecified shoulder: Secondary | ICD-10-CM | POA: Diagnosis not present

## 2011-04-07 DIAGNOSIS — J018 Other acute sinusitis: Secondary | ICD-10-CM | POA: Diagnosis not present

## 2011-04-13 NOTE — Progress Notes (Signed)
Addended by: Janne Napoleon on: 04/13/2011 10:25 AM   Modules accepted: Orders

## 2011-05-27 ENCOUNTER — Other Ambulatory Visit: Payer: Self-pay | Admitting: Cardiology

## 2011-07-28 ENCOUNTER — Other Ambulatory Visit: Payer: Self-pay | Admitting: Cardiology

## 2011-07-28 ENCOUNTER — Ambulatory Visit (INDEPENDENT_AMBULATORY_CARE_PROVIDER_SITE_OTHER): Payer: Medicare Other | Admitting: Internal Medicine

## 2011-07-28 VITALS — BP 160/84 | HR 56 | Temp 98.1°F | Resp 14 | Ht 68.5 in | Wt 199.8 lb

## 2011-07-28 DIAGNOSIS — K589 Irritable bowel syndrome without diarrhea: Secondary | ICD-10-CM

## 2011-07-28 DIAGNOSIS — E663 Overweight: Secondary | ICD-10-CM

## 2011-07-28 DIAGNOSIS — E1365 Other specified diabetes mellitus with hyperglycemia: Secondary | ICD-10-CM

## 2011-07-28 DIAGNOSIS — M25519 Pain in unspecified shoulder: Secondary | ICD-10-CM

## 2011-07-28 DIAGNOSIS — I10 Essential (primary) hypertension: Secondary | ICD-10-CM

## 2011-07-28 DIAGNOSIS — M7580 Other shoulder lesions, unspecified shoulder: Secondary | ICD-10-CM

## 2011-07-28 DIAGNOSIS — E119 Type 2 diabetes mellitus without complications: Secondary | ICD-10-CM

## 2011-07-28 LAB — POCT CBC
Granulocyte percent: 68.4 %G (ref 37–80)
MCH, POC: 30.1 pg (ref 27–31.2)
MID (cbc): 0.4 (ref 0–0.9)
MPV: 8.8 fL (ref 0–99.8)
POC Granulocyte: 4.5 (ref 2–6.9)
POC MID %: 6.8 %M (ref 0–12)
Platelet Count, POC: 258 10*3/uL (ref 142–424)
RBC: 5.31 M/uL (ref 4.69–6.13)
WBC: 6.6 10*3/uL (ref 4.6–10.2)

## 2011-07-28 LAB — POCT GLYCOSYLATED HEMOGLOBIN (HGB A1C): Hemoglobin A1C: 9.5

## 2011-07-28 MED ORDER — DIPHENOXYLATE-ATROPINE 2.5-0.025 MG PO TABS: 1.0000 | ORAL_TABLET | Freq: Four times a day (QID) | ORAL | Status: DC | PRN

## 2011-07-28 MED ORDER — GLYBURIDE-METFORMIN 5-500 MG PO TABS: 1.0000 | ORAL_TABLET | Freq: Two times a day (BID) | ORAL | Status: DC

## 2011-07-28 NOTE — Progress Notes (Signed)
Subjective:    Patient ID: [name], male    DOB: 10-28-1945, 66 y.o.   MRN: [id number]  HPIIBS-Symptoms gradually improved and in control with Lomotil/Danny's refill/he was supposed that this might be secondary to Glucophage over these many years but switching to Januvia made no difference He has been able to reduce his Lomotil to one tablet a day or even every other day at times but now needs refills/colonoscopy up-to-date and normal  AODM-Admits that he has discontinued his medicines over the last several weeks/3 started leftover medicines one week ago  Shoulder pain-In the right has recurred over the last 3 weeks and now wakes him at night He has a past history of recurrent rotator cuff tendinitis which often responds easily to injection/his last injection was one year ago He would like to repeat an injection so he can play an upcoming golf to.   pmh- Patient Active Problem List  Diagnosis  . DM  . ATRIAL FIBRILLATION, PAROXYSMAL  . BPH (benign prostatic hyperplasia)  . IBS (irritable bowel syndrome)  . Overweight  . Bursitis of hip  . Elevated CPK  . Chest pain  . Hypertension  . Ejection fraction  . Bradycardia    Review of SystemsNo fever chills Unfortunately no weight loss No chest pain or palpitations/continues on flecainide to control  arrhythmia     Objective:   Physical Exam Filed Vitals:   07/28/11 1530  BP: 160/84  Pulse: 56  Temp: 98.1 F (36.7 C)  Resp: 14   Continues overweight HEENT clear Heart regular without murmur/no peripheral edema Right shoulder is tender over the upper deltoid with marked pain on abduction past 45. He also has Pain with resisted abduction/adduction is full There no peripheral neurologic symptoms in his lower extremities   Procedure= after informed consent his right shoulder was prepped with Betadine and injected with 3 cc of lidocaine +1 cc of the depo Medrol 80 mg  This was accomplished without difficulty       Results for orders placed in visit on 07/28/11  POCT CBC      Component Value Range   WBC 6.6  4.6 - 10.2 K/uL   Lymph, poc 1.6  0.6 - 3.4   POC LYMPH PERCENT 24.8  10 - 50 %L   MID (cbc) 0.4  0 - 0.9   POC MID % 6.8  0 - 12 %M   POC Granulocyte 4.5  2 - 6.9   Granulocyte percent 68.4  37 - 80 %G   RBC 5.31  4.69 - 6.13 M/uL   Hemoglobin 16.0  14.1 - 18.1 g/dL   HCT, POC 51.1  43.5 - 53.7 %   MCV 96.3  80 - 97 fL   MCH, POC 30.1  27 - 31.2 pg   MCHC 31.3 (*) 31.8 - 35.4 g/dL   RDW, POC 13.2     Platelet Count, POC 258  142 - 424 K/uL   MPV 8.8  0 - 99.8 fL  POCT GLYCOSYLATED HEMOGLOBIN (HGB A1C)      Component Value Range   Hemoglobin A1C 9.5      Assessment & Plan:  Problem #1 uncontrolled diabetes Problem #2 obesity Problem #3 uncontrolled hypertension Problem #4 recurrent rotator cuff tendinitis on the right Problem #5 irritable bowel syndrome-barium boarded him to control his symptoms 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  He understands his problems with noncompliance/promises to be better/      . diphenoxylate-atropine (LOMOTIL) 2.5-0.025 MG per tablet    Sig: Take 1 tablet by mouth 4 (four) times daily as needed.    Dispense:  60 tablet    Refill:  3  . glyBURIDE-metformin (GLUCOVANCE) 5-500 MG per tablet    Sig: Take 1 tablet by mouth 2 (two) times daily with a meal.    Dispense:  60 tablet    Refill:  4    -  Continue to monitor home blood pressures and current medication doses  Followup in 4 months planned  Post injection he will ice the shoulder, use Tylenol for any discomfort, begin gentle range of motion 7/28/ progress to full exercises for the shoulder

## 2011-07-29 LAB — COMPREHENSIVE METABOLIC PANEL
ALT: 35 U/L (ref 0–53)
AST: 19 U/L (ref 0–37)
Albumin: 4.6 g/dL (ref 3.5–5.2)
Alkaline Phosphatase: 50 U/L (ref 39–117)
BUN: 15 mg/dL (ref 6–23)
Creat: 0.89 mg/dL (ref 0.50–1.35)
Potassium: 4.1 mEq/L (ref 3.5–5.3)

## 2011-07-31 ENCOUNTER — Encounter: Payer: Self-pay | Admitting: Internal Medicine

## 2011-08-02 ENCOUNTER — Other Ambulatory Visit: Payer: Self-pay

## 2011-09-06 ENCOUNTER — Telehealth: Payer: Self-pay

## 2011-09-06 DIAGNOSIS — K7581 Nonalcoholic steatohepatitis (NASH): Secondary | ICD-10-CM | POA: Insufficient documentation

## 2011-09-06 NOTE — Telephone Encounter (Signed)
Can we RX without office visit?

## 2011-09-06 NOTE — Telephone Encounter (Signed)
Review of his chart back to 2006 did not reveal evaluation for a rash on his penis, nor a cream Rx'd.  He'll need to be seen.

## 2011-09-06 NOTE — Telephone Encounter (Signed)
PT STATES HE USUALLY GETS THIS RASH ON THE TIP OF HIS PENIS AND IT JUST [place]. DOESN'T HURT TO GO TO THE BATHROOM AND WE HAVE GIVEN HIM THE MEDICINE BEFORE FOR IT PLEASE CALL [phone]   [facility] ON [address]

## 2011-09-07 DIAGNOSIS — I10 Essential (primary) hypertension: Secondary | ICD-10-CM | POA: Diagnosis not present

## 2011-09-07 DIAGNOSIS — N476 Balanoposthitis: Secondary | ICD-10-CM | POA: Diagnosis not present

## 2011-09-07 NOTE — Telephone Encounter (Signed)
Patient advised x3 he states he gets this cream, he states he is not coming in for this and he is very upset. I have told him we can not prescribe without seeing the area, do not want to prescribe wrong medication. He is angry.

## 2011-09-18 DIAGNOSIS — H11159 Pinguecula, unspecified eye: Secondary | ICD-10-CM | POA: Diagnosis not present

## 2011-09-30 ENCOUNTER — Other Ambulatory Visit: Payer: Self-pay | Admitting: *Deleted

## 2011-09-30 MED ORDER — TAMSULOSIN HCL 0.4 MG PO CAPS: 0.4000 mg | ORAL_CAPSULE | Freq: Every day | ORAL | Status: DC

## 2011-10-05 DIAGNOSIS — J209 Acute bronchitis, unspecified: Secondary | ICD-10-CM | POA: Diagnosis not present

## 2011-10-09 DIAGNOSIS — J209 Acute bronchitis, unspecified: Secondary | ICD-10-CM | POA: Diagnosis not present

## 2011-11-02 DIAGNOSIS — Z760 Encounter for issue of repeat prescription: Secondary | ICD-10-CM | POA: Diagnosis not present

## 2011-11-06 DIAGNOSIS — H10819 Pingueculitis, unspecified eye: Secondary | ICD-10-CM | POA: Diagnosis not present

## 2011-12-11 ENCOUNTER — Other Ambulatory Visit: Payer: Self-pay

## 2011-12-11 MED ORDER — RAMIPRIL 5 MG PO CAPS: 5.0000 mg | ORAL_CAPSULE | Freq: Every day | ORAL | Status: DC

## 2011-12-13 ENCOUNTER — Other Ambulatory Visit: Payer: Self-pay

## 2011-12-13 MED ORDER — RAMIPRIL 5 MG PO CAPS: 5.0000 mg | ORAL_CAPSULE | Freq: Every day | ORAL | Status: DC

## 2012-01-26 ENCOUNTER — Ambulatory Visit (INDEPENDENT_AMBULATORY_CARE_PROVIDER_SITE_OTHER): Payer: Medicare Other | Admitting: Internal Medicine

## 2012-01-26 VITALS — BP 128/88 | HR 86 | Temp 98.1°F | Resp 16 | Ht 68.0 in | Wt 201.0 lb

## 2012-01-26 DIAGNOSIS — N400 Enlarged prostate without lower urinary tract symptoms: Secondary | ICD-10-CM

## 2012-01-26 DIAGNOSIS — Z125 Encounter for screening for malignant neoplasm of prostate: Secondary | ICD-10-CM

## 2012-01-26 DIAGNOSIS — K589 Irritable bowel syndrome without diarrhea: Secondary | ICD-10-CM | POA: Diagnosis not present

## 2012-01-26 DIAGNOSIS — E663 Overweight: Secondary | ICD-10-CM

## 2012-01-26 DIAGNOSIS — E1365 Other specified diabetes mellitus with hyperglycemia: Secondary | ICD-10-CM

## 2012-01-26 DIAGNOSIS — I10 Essential (primary) hypertension: Secondary | ICD-10-CM

## 2012-01-26 DIAGNOSIS — K7689 Other specified diseases of liver: Secondary | ICD-10-CM

## 2012-01-26 DIAGNOSIS — I4891 Unspecified atrial fibrillation: Secondary | ICD-10-CM

## 2012-01-26 DIAGNOSIS — E119 Type 2 diabetes mellitus without complications: Secondary | ICD-10-CM

## 2012-01-26 DIAGNOSIS — K7581 Nonalcoholic steatohepatitis (NASH): Secondary | ICD-10-CM

## 2012-01-26 DIAGNOSIS — Z79899 Other long term (current) drug therapy: Secondary | ICD-10-CM

## 2012-01-26 LAB — COMPREHENSIVE METABOLIC PANEL
ALT: 157 U/L — ABNORMAL HIGH (ref 0–53)
Albumin: 4.8 g/dL (ref 3.5–5.2)
CO2: 29 mEq/L (ref 19–32)
Calcium: 9.7 mg/dL (ref 8.4–10.5)
Chloride: 103 mEq/L (ref 96–112)
Sodium: 141 mEq/L (ref 135–145)
Total Protein: 7.2 g/dL (ref 6.0–8.3)

## 2012-01-26 LAB — CBC WITH DIFFERENTIAL/PLATELET
Eosinophils Relative: 2 % (ref 0–5)
HCT: 46.4 % (ref 39.0–52.0)
Lymphocytes Relative: 20 % (ref 12–46)
Lymphs Abs: 1 10*3/uL (ref 0.7–4.0)
MCV: 88.9 fL (ref 78.0–100.0)
Monocytes Absolute: 0.4 10*3/uL (ref 0.1–1.0)
RBC: 5.22 MIL/uL (ref 4.22–5.81)
WBC: 5 10*3/uL (ref 4.0–10.5)

## 2012-01-26 LAB — LIPID PANEL: Cholesterol: 190 mg/dL (ref 0–200)

## 2012-01-26 MED ORDER — METOPROLOL SUCCINATE ER 50 MG PO TB24: 50.0000 mg | ORAL_TABLET | Freq: Every day | ORAL | Status: DC

## 2012-01-26 MED ORDER — DIPHENOXYLATE-ATROPINE 2.5-0.025 MG PO TABS: 1.0000 | ORAL_TABLET | Freq: Four times a day (QID) | ORAL | Status: DC | PRN

## 2012-01-26 MED ORDER — GLYBURIDE-METFORMIN 5-500 MG PO TABS: 1.0000 | ORAL_TABLET | Freq: Two times a day (BID) | ORAL | Status: DC

## 2012-01-26 MED ORDER — TAMSULOSIN HCL 0.4 MG PO CAPS: 0.4000 mg | ORAL_CAPSULE | Freq: Every day | ORAL | Status: DC

## 2012-01-26 NOTE — Progress Notes (Signed)
Subjective:    Patient ID: [name], male    DOB: 03-06-1945, 67 y.o.   MRN: [id number]  HPI Patient Active Problem List  Diagnosis  . DM-stopped meds for variety of reasons but mainly feels bad  When he takes medication, especially with GI symptoms   . ATRIAL FIBRILLATION, PAROXYSMAL-stable Dr Myrtis Ser  . BPH (benign prostatic hyperplasia)-evaluated by urology /continues to use Flomax with success although out for the last 2 months   . IBS (irritable bowel syndrome)-lomotil has given him control at work//Metf makes diarr worse as does glucotrol/colonoscopy up-to-date   . Overweight--lost to 184 then regained during stress  . Bursitis of hip        . Hypertension-no further chest pain or palpitations /his exercise tolerance has been good until his recent problems with family and friends when he stopped doing most activities         . Rotator cuff tendinitis  . NASH (nonalcoholic steatohepatitis)-no upper gi sxtoms     Flu shot 11/13 Recent stress had to do with losses; 10-12 people died recently incl brother Review of Systems HEENT clear No chest pain or palpitations/no edema/no dyspnea on exertion GI as noted GU symptoms consistent with BPH Joint symptoms as noted above/stable at this point No new neurological complaints No psychological complaints     Objective:   Physical Exam BP 128/88  Pulse 86  Temp 98.1 F (36.7 C) (Oral)  Resp 16  Ht 5\' 8"  (1.727 m)  Wt 201 lb (91.173 kg)  BMI 30.56 kg/m2  SpO2 98% Pupils equal round reactive to light and accommodation/EOMs conjugate ENT clear No thyromegaly Heart regular without murmur/no carotid bruits Lungs clear Abdomen benign extremities without edema/good peripheral pulses neurological intact      Results for orders placed in visit on 01/26/12  POCT GLYCOSYLATED HEMOGLOBIN (HGB A1C)      Component Value Range   Hemoglobin A1C 9.6      Assessment & Plan:   1. ATRIAL FIBRILLATION, PAROXYSMAL    2. BPH  (benign prostatic hyperplasia)  Tamsulosin HCl (FLOMAX) 0.4 MG CAPS  3. DM  POCT glycosylated hemoglobin (Hb A1C), Lipid panel  4. Hypertension  CBC with Differential, Comprehensive metabolic panel, metoprolol succinate (TOPROL-XL) 50 MG 24 hr tablet  5. IBS (irritable bowel syndrome)  diphenoxylate-atropine (LOMOTIL) 2.5-0.025 MG per tablet  6. NASH (nonalcoholic steatohepatitis)    7. Overweight    8. Screening for prostate cancer  PSA, Medicare  9. DM (diabetes mellitus), secondary uncontrolled  glyBURIDE-metformin (GLUCOVANCE) 5-500 MG per tablet  10. Medication management  metoprolol succinate (TOPROL-XL) 50 MG 24 hr tablet  11.NASH   restarting diabetes medications and trying to lose weight are the 2 main objectives Recheck labs in 3 months  Results for orders placed in visit on 01/26/12  POCT GLYCOSYLATED HEMOGLOBIN (HGB A1C)      Component Value Range   Hemoglobin A1C 9.6    CBC WITH DIFFERENTIAL      Component Value Range   WBC 5.0  4.0 - 10.5 K/uL   RBC 5.22  4.22 - 5.81 MIL/uL   Hemoglobin 15.9  13.0 - 17.0 g/dL   HCT 95.2  84.1 - 32.4 %   MCV 88.9  78.0 - 100.0 fL   MCH 30.5  26.0 - 34.0 pg   MCHC 34.3  30.0 - 36.0 g/dL   RDW 40.1  02.7 - 25.3 %   Platelets 212  150 - 400 K/uL   Neutrophils Relative 70  43 - 77 %   Neutro Abs 3.5  1.7 - 7.7 K/uL   Lymphocytes Relative 20  12 - 46 %   Lymphs Abs 1.0  0.7 - 4.0 K/uL   Monocytes Relative 7  3 - 12 %   Monocytes Absolute 0.4  0.1 - 1.0 K/uL   Eosinophils Relative 2  0 - 5 %   Eosinophils Absolute 0.1  0.0 - 0.7 K/uL   Basophils Relative 1  0 - 1 %   Basophils Absolute 0.0  0.0 - 0.1 K/uL   Smear Review Criteria for review not met    COMPREHENSIVE METABOLIC PANEL      Component Value Range   Sodium 141  135 - 145 mEq/L   Potassium 4.2  3.5 - 5.3 mEq/L   Chloride 103  96 - 112 mEq/L   CO2 29  19 - 32 mEq/L   Glucose, Bld 151 (*) 70 - 99 mg/dL   BUN 12  6 - 23 mg/dL   Creat 2.13  0.86 - 5.78 mg/dL   Total  Bilirubin 0.5  0.3 - 1.2 mg/dL   Alkaline Phosphatase 62  39 - 117 U/L   AST 43 (*) 0 - 37 U/L   ALT 157 (*) 0 - 53 U/L   Total Protein 7.2  6.0 - 8.3 g/dL   Albumin 4.8  3.5 - 5.2 g/dL   Calcium 9.7  8.4 - 46.9 mg/dL  LIPID PANEL      Component Value Range   Cholesterol 190  0 - 200 mg/dL   Triglycerides 629  <528 mg/dL   HDL 40  >41 mg/dL   Total CHOL/HDL Ratio 4.8     VLDL 28  0 - 40 mg/dL   LDL Cholesterol 324 (*) 0 - 99 mg/dL  PSA, MEDICARE      Component Value Range   PSA 1.05  <=4.00 ng/mL    40 min OV

## 2012-01-28 ENCOUNTER — Encounter: Payer: Self-pay | Admitting: Internal Medicine

## 2012-01-30 NOTE — Progress Notes (Signed)
Received approval of prior auth for pt's Glyburide-metformin through 01/01/13. Faxed approval to pharmacy.

## 2012-02-23 ENCOUNTER — Telehealth: Payer: Self-pay

## 2012-02-23 MED ORDER — LOPERAMIDE HCL 2 MG PO TABS: 2.0000 mg | ORAL_TABLET | Freq: Four times a day (QID) | ORAL | Status: DC | PRN

## 2012-02-23 NOTE — Telephone Encounter (Signed)
Yes and yes I'll sign

## 2012-02-23 NOTE — Telephone Encounter (Signed)
Thank you printed called him to advise it is ready.

## 2012-02-23 NOTE — Telephone Encounter (Signed)
Please advise on Jury duty note, I can write this for you to sign if you approve. Pended Rx for Loperamide

## 2012-02-23 NOTE — Telephone Encounter (Signed)
Pt is leaving his jury summons with [place] (i put it in [place] box) and asks for dr to write excuse for him to miss jury duty due to his ibs. States he cant sit for that long due to multiple trips to the bathroom.   Also, pt is requesting the medication Loperamide for his IBS. Asks if it could possibly thicken/harden his stools so that he doesn't have the diarrhea he is experiencing.   Best: [phone]  bf

## 2012-02-23 NOTE — Telephone Encounter (Signed)
Pt called and stated that he asked his pharmacist about the Rx Dr Merla Riches had sent in for his IBS/diarrhea, and that he was told that the medication is Immodium. Pt stated that he already takes Immodium BID (OTC) and that he really needs something that will harden his stool up, not just slow down his bowels. Requests [place] to ask Dr Merla Riches if there is another medication that might work better, or if he should increase the dose of his Immodium to QID? Please call pt back w/ Dr Doolittle's advice at [phone].

## 2012-02-24 NOTE — Telephone Encounter (Signed)
Frequency is more important than consistency/he should be using Imodium to keep his stools to less than 3 a day not to make a more firm/that can only be done by diet

## 2012-02-26 NOTE — Telephone Encounter (Signed)
Patient advised. He states now he is improving. He will continue as he has been.

## 2012-02-26 NOTE — Telephone Encounter (Signed)
Left message for him to call me back.  

## 2012-03-13 ENCOUNTER — Other Ambulatory Visit: Payer: Self-pay

## 2012-03-13 MED ORDER — FLECAINIDE ACETATE 150 MG PO TABS: ORAL_TABLET | ORAL | Status: DC

## 2012-04-10 ENCOUNTER — Ambulatory Visit: Payer: Medicare Other | Admitting: Cardiology

## 2012-04-18 ENCOUNTER — Encounter: Payer: Self-pay | Admitting: Cardiology

## 2012-04-18 ENCOUNTER — Ambulatory Visit (INDEPENDENT_AMBULATORY_CARE_PROVIDER_SITE_OTHER): Payer: Medicare Other | Admitting: Cardiology

## 2012-04-18 VITALS — BP 130/80 | HR 63 | Ht 68.0 in | Wt 197.0 lb

## 2012-04-18 DIAGNOSIS — I4891 Unspecified atrial fibrillation: Secondary | ICD-10-CM | POA: Diagnosis not present

## 2012-04-18 DIAGNOSIS — R079 Chest pain, unspecified: Secondary | ICD-10-CM | POA: Diagnosis not present

## 2012-04-18 DIAGNOSIS — Z5181 Encounter for therapeutic drug level monitoring: Secondary | ICD-10-CM | POA: Diagnosis not present

## 2012-04-18 DIAGNOSIS — R748 Abnormal levels of other serum enzymes: Secondary | ICD-10-CM

## 2012-04-18 DIAGNOSIS — Z79899 Other long term (current) drug therapy: Secondary | ICD-10-CM

## 2012-04-18 NOTE — Assessment & Plan Note (Signed)
It is documented that the patient had elevated CPK in the past with normal MB and normal troponin. I cannot find any explanation for this documentation. He was on a statin. He tells me it was stopped because his LDL was not elevated. At this time I have chosen to check another CPK.

## 2012-04-18 NOTE — Assessment & Plan Note (Addendum)
Patient continues to do extremely well on flecainide. It is high dose. A level be checked again. I will also be talking with electrophysiology about this dosing. The patient has a chads score that would suggest that we should use anticoagulation. However he has very infrequent episodes and he does not want to be anticoagulated with the type of work that he does. I will continue to rereview this issue carefully each year.

## 2012-04-18 NOTE — Progress Notes (Signed)
Patient ID: [name], male   DOB: 11-14-1945, 67 y.o.   MRN: [id number]   HPI  Patient is seen for followup of atrial fibrillation. He has done extremely well on flecainide for a prolonged period of time. I have carefully followed his levels. In April, 2013 the flecainide level was 0.55. The patient is on a high dose. He takes 225 mg twice daily. We have carefully titrated him up to that over the years. His QT interval has always been normal.He has rare palpitations. Over the years we have also talked at great length about whether anticoagulation should be used. We have chosen not to treat because of the type of work he does.  Allergies  Allergen Reactions  . Lipitor (Atorvastatin)     Myopathy Spring 2006    Current Outpatient Prescriptions  Medication Sig Dispense Refill  . diphenoxylate-atropine (LOMOTIL) 2.5-0.025 MG per tablet Take 1 tablet by mouth 4 (four) times daily as needed.  60 tablet  3  . flecainide (TAMBOCOR) 150 MG tablet TAKE ONE (1) AND 1/2 TABLETS TWICE DAILY  90 tablet  1  . glyBURIDE-metformin (GLUCOVANCE) 5-500 MG per tablet Take 1 tablet by mouth 2 (two) times daily with a meal.  60 tablet  4  . loperamide (IMODIUM A-D) 2 MG tablet Take 1 tablet (2 mg total) by mouth 4 (four) times daily as needed for diarrhea or loose stools.  40 tablet  2  . metoprolol succinate (TOPROL-XL) 50 MG 24 hr tablet Take 1 tablet (50 mg total) by mouth daily.  30 tablet  11  . ramipril (ALTACE) 5 MG capsule Take 1 capsule (5 mg total) by mouth daily.  30 capsule  5  . Tamsulosin HCl (FLOMAX) 0.4 MG CAPS Take 1 capsule (0.4 mg total) by mouth daily.  30 capsule  0   No current facility-administered medications for this visit.    History   Social History  . Marital Status: Married    Spouse Name: N/A    Number of Children: N/A  . Years of Education: N/A   Occupational History  . Not on file.   Social History Main Topics  . Smoking status: Never Smoker   . Smokeless tobacco:  Not on file  . Alcohol Use: Not on file  . Drug Use: Not on file  . Sexually Active: Not on file   Other Topics Concern  . Not on file   Social History Narrative  . No narrative on file    No family history on file.  Past Medical History  Diagnosis Date  . Hypertension   . Elevated CPK     CPK elevated with normal MB and normal troponin the past  . Chest pain     Nuclear, 2006, no ischemia  . Atrial fibrillation     Paroxysmal, rare episodes, sinus rhythm on flecainide, patient prefers not to take Coumadin  . Arthritis     Right shoulder  . Ejection fraction     EF 55-60%,  echo, January, 2011  . Bradycardia     April, 2013    No past surgical history on file.  Patient Active Problem List  Diagnosis  . DM  . ATRIAL FIBRILLATION, PAROXYSMAL  . BPH (benign prostatic hyperplasia)  . IBS (irritable bowel syndrome)  . Overweight  . Bursitis of hip  . Elevated CPK  . Chest pain  . Hypertension  . Ejection fraction  . Bradycardia  . Rotator cuff tendinitis  . NASH (nonalcoholic  steatohepatitis)    ROS   Patient denies fever, chills, headache, sweats, rash, change in vision, change in hearing, chest pain, cough, nausea vomiting, urinary symptoms. All other systems are reviewed and are negative.  PHYSICAL EXAM Patient is oriented to person time and place. Affect is normal. Lungs are clear. Respiratory effort is nonlabored. Cardiac exam rales S1 and S2. There no clicks or significant murmurs. The abdomen is soft. There is no peripheral edema.  Filed Vitals:   04/18/12 1553  BP: 130/80  Pulse: 63  Height: 5\' 8"  (1.727 m)  Weight: 197 lb (89.359 kg)   EKG is done today and reviewed by me. There is normal sinus rhythm. QT interval is normal. No significant change.  ASSESSMENT & PLAN

## 2012-04-18 NOTE — Patient Instructions (Addendum)
Your physician recommends that you continue on your current medications as directed. Please refer to the Current Medication list given to you today.  Your physician recommends that you return for lab work in: April 22 (do not take morning dose of Flecainide)  Your physician wants you to follow-up in: 1 year. You will receive a reminder letter in the mail two months in advance. If you don't receive a letter, please call our office to schedule the follow-up appointment.

## 2012-04-23 ENCOUNTER — Other Ambulatory Visit: Payer: Medicare Other

## 2012-05-22 DIAGNOSIS — Z760 Encounter for issue of repeat prescription: Secondary | ICD-10-CM | POA: Diagnosis not present

## 2012-05-22 DIAGNOSIS — K589 Irritable bowel syndrome without diarrhea: Secondary | ICD-10-CM | POA: Diagnosis not present

## 2012-05-22 DIAGNOSIS — I10 Essential (primary) hypertension: Secondary | ICD-10-CM | POA: Diagnosis not present

## 2012-05-31 ENCOUNTER — Other Ambulatory Visit: Payer: Self-pay

## 2012-05-31 MED ORDER — FLECAINIDE ACETATE 150 MG PO TABS: ORAL_TABLET | ORAL | Status: DC

## 2012-05-31 NOTE — Telephone Encounter (Signed)
flecainide (TAMBOCOR) 150 MG tablet  TAKE ONE (1) AND 1/2 TABLETS TWICE DAILY   90 tablet   ATRIAL FIBRILLATION, PAROXYSMAL - [name] Abed, MD at 04/18/2012  5:38 PM  Status: Linus Orn Related Problem: ATRIAL FIBRILLATION, PAROXYSMAL  Patient continues to do extremely well on flecainide. It is high dose. A level be checked again. I will also be talking with electrophysiology about this dosing. The patient has a chads score that would suggest that we should use anticoagulation. However he has very infrequent episodes and he does not want to be anticoagulated with the type of work that he does. I will continue to rereview this issue carefully each year.

## 2012-07-04 ENCOUNTER — Other Ambulatory Visit: Payer: Self-pay | Admitting: Cardiology

## 2012-07-04 NOTE — Telephone Encounter (Signed)
ramipril (ALTACE) 5 MG capsule  Take 1 capsule (5 mg total) by mouth daily.   30 capsule   5

## 2012-07-31 ENCOUNTER — Other Ambulatory Visit: Payer: Self-pay

## 2012-07-31 MED ORDER — FLECAINIDE ACETATE 150 MG PO TABS: ORAL_TABLET | ORAL | Status: DC

## 2012-07-31 NOTE — Telephone Encounter (Signed)
flecainide (TAMBOCOR) 150 MG tablet  TAKE ONE (1) AND 1/2 TABLETS TWICE DAILY  90 tablet  1

## 2012-08-06 ENCOUNTER — Other Ambulatory Visit: Payer: Self-pay | Admitting: *Deleted

## 2012-08-06 MED ORDER — FLECAINIDE ACETATE 150 MG PO TABS: ORAL_TABLET | ORAL | Status: DC

## 2012-08-13 ENCOUNTER — Other Ambulatory Visit: Payer: Self-pay

## 2012-08-13 ENCOUNTER — Telehealth: Payer: Self-pay

## 2012-08-13 DIAGNOSIS — R748 Abnormal levels of other serum enzymes: Secondary | ICD-10-CM

## 2012-08-13 DIAGNOSIS — Z79899 Other long term (current) drug therapy: Secondary | ICD-10-CM

## 2012-08-13 NOTE — Telephone Encounter (Signed)
Pt states that he forgot to have labs drawn in March so he rescheduled for this Thursday 8/14, he is aware to hold Flecainide dose that morning. Also, pt states that he gets his Flecanide from [facility], not [facility] b/c he believes the Flecainide that he was receiving from them was making him sick due to the coating on tablet. Labs have been ordered and appt scheduled for Thursday.

## 2012-08-13 NOTE — Telephone Encounter (Signed)
**Note De-Identified [name] Obfuscation** We received an RX clarification request for pts Flecainide from [facility]. Pt is taking Flacainide 150 mg (take 1 and 1/2 tablet BID= 225 mg BID). Pt was to have Flecainide level and CK drawn in March but did not so Ive left message for him to call office to reschedule labs. Pt will need to be reminded not to take Flecanide dose the morning labs are to be drawn.

## 2012-08-15 ENCOUNTER — Other Ambulatory Visit (INDEPENDENT_AMBULATORY_CARE_PROVIDER_SITE_OTHER): Payer: Medicare Other

## 2012-08-15 DIAGNOSIS — Z79899 Other long term (current) drug therapy: Secondary | ICD-10-CM | POA: Diagnosis not present

## 2012-08-15 DIAGNOSIS — R748 Abnormal levels of other serum enzymes: Secondary | ICD-10-CM | POA: Diagnosis not present

## 2012-08-15 LAB — CK: Total CK: 118 U/L (ref 7–232)

## 2012-08-17 ENCOUNTER — Ambulatory Visit (INDEPENDENT_AMBULATORY_CARE_PROVIDER_SITE_OTHER): Payer: Medicare Other | Admitting: Internal Medicine

## 2012-08-17 ENCOUNTER — Telehealth: Payer: Self-pay

## 2012-08-17 VITALS — BP 138/64 | HR 57 | Temp 97.9°F | Resp 18 | Ht 67.0 in | Wt 194.0 lb

## 2012-08-17 DIAGNOSIS — E119 Type 2 diabetes mellitus without complications: Secondary | ICD-10-CM | POA: Diagnosis not present

## 2012-08-17 DIAGNOSIS — J329 Chronic sinusitis, unspecified: Secondary | ICD-10-CM

## 2012-08-17 DIAGNOSIS — I10 Essential (primary) hypertension: Secondary | ICD-10-CM

## 2012-08-17 LAB — LIPID PANEL
Cholesterol: 188 mg/dL (ref 0–200)
HDL: 45 mg/dL (ref >39)
Triglycerides: 117 mg/dL (ref <150)

## 2012-08-17 LAB — POCT GLYCOSYLATED HEMOGLOBIN (HGB A1C): Hemoglobin A1C: 9.2

## 2012-08-17 MED ORDER — GLYBURIDE 2.5 MG PO TABS: 2.5000 mg | ORAL_TABLET | Freq: Two times a day (BID) | ORAL | Status: DC

## 2012-08-17 MED ORDER — METFORMIN HCL 500 MG PO TABS: 500.0000 mg | ORAL_TABLET | Freq: Two times a day (BID) | ORAL | Status: DC

## 2012-08-17 MED ORDER — LEVOCETIRIZINE DIHYDROCHLORIDE 5 MG PO TABS: 5.0000 mg | ORAL_TABLET | Freq: Every evening | ORAL | Status: DC

## 2012-08-17 NOTE — Telephone Encounter (Signed)
Patient's medication is not at his pharmacy, [facility] on [address]. Patient cannot remember the names of the meds but states that they were the ones discussed during today's visit.  [phone]

## 2012-08-17 NOTE — Progress Notes (Signed)
  Subjective:    Patient ID: [name], male    DOB: 20-Jul-1945, 67 y.o.   MRN: [id number]  HPIf/u Patient Active Problem List   Diagnosis Date Noted  . DM 01/21/2009    Priority: High  . ATRIAL FIBRILLATION, PAROXYSMAL 01/21/2009    Priority: High  . Hypertension     Priority: Medium  . IBS (irritable bowel syndrome) 03/11/2011    Priority: Medium  . Overweight 03/11/2011    Priority: Medium  . NASH (nonalcoholic steatohepatitis) 09/06/2011  . Rotator cuff tendinitis 07/28/2011  . Bradycardia   . Elevated CPK   . Chest pain   . Ejection fraction   . BPH (benign prostatic hyperplasia) 03/11/2011  . Bursitis of hip 03/11/2011   Missed f/u at 3 mos after meds restarted At 5/500 bid had diarrhea daily---has been taking only 1/2 5/500 once a day  Card f/u stable this spring  Review of Systems  Constitutional: Negative for activity change, appetite change, fatigue and unexpected weight change.  Eyes: Negative for visual disturbance.  Respiratory: Negative for shortness of breath.   Cardiovascular: Negative for chest pain, palpitations and leg swelling.  Gastrointestinal: Negative for abdominal pain.  Neurological: Negative for headaches.  Psychiatric/Behavioral: Negative for behavioral problems.       Objective:   Physical Exam BP 138/64  Pulse 57  Temp(Src) 97.9 F (36.6 C) (Oral)  Resp 18  Ht 5\' 7"  (1.702 m)  Wt 194 lb (87.998 kg)  BMI 30.38 kg/m2  SpO2 100% No acute distress Pupils equal round reactive to light and accommodation/EOMs conjugate TMs clear  Nares boggy purulent discharge Tender maxillary areas Throat clear No nodes or thyromegaly Chest clear Heart regular No distal sensory losses       Results for orders placed in visit on 08/17/12  POCT GLYCOSYLATED HEMOGLOBIN (HGB A1C)      Result Value Range   Hemoglobin A1C 9.2      Assessment & Plan:  Type 2 diabetes remains uncontrolled secondary to medication intolerance  Will try  separating the doses/discussed insulin option  Hypertension--no change Overweight--- exercise Sinusitis--- amoxicillin plus loratadine

## 2012-08-18 ENCOUNTER — Telehealth: Payer: Self-pay

## 2012-08-18 MED ORDER — GLYBURIDE 2.5 MG PO TABS: 2.5000 mg | ORAL_TABLET | Freq: Two times a day (BID) | ORAL | Status: DC

## 2012-08-18 MED ORDER — METFORMIN HCL 500 MG PO TABS: 500.0000 mg | ORAL_TABLET | Freq: Two times a day (BID) | ORAL | Status: DC

## 2012-08-18 MED ORDER — LEVOCETIRIZINE DIHYDROCHLORIDE 5 MG PO TABS: 5.0000 mg | ORAL_TABLET | Freq: Every evening | ORAL | Status: DC

## 2012-08-18 NOTE — Telephone Encounter (Signed)
Dr Merla Riches pt called and wanted his RX's sent to [facility] on [address] because we sent them to [facility]. He is asking for his Amoxicillin Rx but I didn't see this written in the order list. Please advise and let me know if I need to send in the ABX. Thanks

## 2012-08-18 NOTE — Telephone Encounter (Signed)
Dr Merla Riches these medications were discontinued, did you want to send any medications for this pt?

## 2012-08-18 NOTE — Telephone Encounter (Signed)
Patient was seen yesterday by Dr. Laney Pastor, meds were sent to mail order, patient requested they be sent locally  needs his medication amoxicillian, daibeta, xyzel, metofrmin.    [facility] into [facility] on [address].

## 2012-08-19 ENCOUNTER — Encounter: Payer: Self-pay | Admitting: Internal Medicine

## 2012-08-19 MED ORDER — GLYBURIDE 2.5 MG PO TABS: 2.5000 mg | ORAL_TABLET | Freq: Two times a day (BID) | ORAL | Status: DC

## 2012-08-19 MED ORDER — AMOXICILLIN 875 MG PO TABS: 875.0000 mg | ORAL_TABLET | Freq: Two times a day (BID) | ORAL | Status: DC

## 2012-08-19 MED ORDER — METFORMIN HCL 500 MG PO TABS: 500.0000 mg | ORAL_TABLET | Freq: Two times a day (BID) | ORAL | Status: DC

## 2012-08-19 MED ORDER — LEVOCETIRIZINE DIHYDROCHLORIDE 5 MG PO TABS: 5.0000 mg | ORAL_TABLET | Freq: Every evening | ORAL | Status: DC

## 2012-08-19 NOTE — Telephone Encounter (Signed)
Changed to [facility] ordered this encounter  Medications  . DISCONTD: glyBURIDE (DIABETA) 2.5 MG tablet    Sig: Take 1 tablet (2.5 mg total) by mouth 2 (two) times daily with a meal.    Dispense:  60 tablet    Refill:  5  . DISCONTD: levocetirizine (XYZAL) 5 MG tablet    Sig: Take 1 tablet (5 mg total) by mouth every evening. For allergies    Dispense:  30 tablet    Refill:  0  . DISCONTD: metFORMIN (GLUCOPHAGE) 500 MG tablet    Sig: Take 1 tablet (500 mg total) by mouth 2 (two) times daily with a meal.    Dispense:  60 tablet    Refill:  5  . metFORMIN (GLUCOPHAGE) 500 MG tablet    Sig: Take 1 tablet (500 mg total) by mouth 2 (two) times daily with a meal.    Dispense:  60 tablet    Refill:  5  . levocetirizine (XYZAL) 5 MG tablet    Sig: Take 1 tablet (5 mg total) by mouth every evening. For allergies    Dispense:  30 tablet    Refill:  0  . glyBURIDE (DIABETA) 2.5 MG tablet    Sig: Take 1 tablet (2.5 mg total) by mouth 2 (two) times daily with a meal.    Dispense:  60 tablet    Refill:  5  . amoxicillin (AMOXIL) 875 MG tablet    Sig: Take 1 tablet (875 mg total) by mouth 2 (two) times daily.    Dispense:  20 tablet    Refill:  0

## 2012-08-20 NOTE — Telephone Encounter (Signed)
Spoke with pt and advised Rx sent in.

## 2012-08-22 ENCOUNTER — Encounter: Payer: Self-pay | Admitting: Cardiology

## 2012-08-22 NOTE — Progress Notes (Signed)
   At last visit I arranged CPK (118) and Flecainide level (0.53).  He takes high dose flecainide that works for him. Level still safe.  Continue.

## 2012-08-27 ENCOUNTER — Other Ambulatory Visit: Payer: Self-pay | Admitting: Internal Medicine

## 2012-09-07 NOTE — Progress Notes (Signed)
PA for was faxed to [facility] on 08/22/12. I had not received decision and called pharmacy to check status. Pharmacist stated Rx had been filled and not PA still showing so it looks as if it was approved.

## 2012-10-03 ENCOUNTER — Other Ambulatory Visit: Payer: Self-pay | Admitting: Internal Medicine

## 2012-10-03 ENCOUNTER — Other Ambulatory Visit: Payer: Self-pay

## 2012-10-03 MED ORDER — FLECAINIDE ACETATE 150 MG PO TABS: ORAL_TABLET | ORAL | Status: DC

## 2012-10-03 MED ORDER — RAMIPRIL 5 MG PO CAPS: ORAL_CAPSULE | ORAL | Status: DC

## 2012-10-07 ENCOUNTER — Other Ambulatory Visit: Payer: Self-pay | Admitting: Internal Medicine

## 2012-12-12 DIAGNOSIS — E119 Type 2 diabetes mellitus without complications: Secondary | ICD-10-CM | POA: Diagnosis not present

## 2012-12-13 ENCOUNTER — Other Ambulatory Visit: Payer: Self-pay | Admitting: Internal Medicine

## 2013-01-28 DIAGNOSIS — J00 Acute nasopharyngitis [common cold]: Secondary | ICD-10-CM | POA: Diagnosis not present

## 2013-02-01 ENCOUNTER — Other Ambulatory Visit: Payer: Self-pay | Admitting: Internal Medicine

## 2013-02-10 ENCOUNTER — Other Ambulatory Visit: Payer: Self-pay

## 2013-02-10 MED ORDER — FLECAINIDE ACETATE 150 MG PO TABS: ORAL_TABLET | ORAL | Status: DC

## 2013-02-22 ENCOUNTER — Ambulatory Visit (INDEPENDENT_AMBULATORY_CARE_PROVIDER_SITE_OTHER): Payer: Medicare Other | Admitting: Internal Medicine

## 2013-02-22 VITALS — BP 150/70 | HR 60 | Temp 98.4°F | Resp 16 | Ht 67.5 in | Wt 192.0 lb

## 2013-02-22 DIAGNOSIS — J019 Acute sinusitis, unspecified: Secondary | ICD-10-CM | POA: Diagnosis not present

## 2013-02-22 DIAGNOSIS — E1165 Type 2 diabetes mellitus with hyperglycemia: Principal | ICD-10-CM

## 2013-02-22 DIAGNOSIS — IMO0001 Reserved for inherently not codable concepts without codable children: Secondary | ICD-10-CM | POA: Diagnosis not present

## 2013-02-22 LAB — POCT CBC
Granulocyte percent: 71.4 %G (ref 37–80)
HCT, POC: 46.8 % (ref 43.5–53.7)
Hemoglobin: 14.4 g/dL (ref 14.1–18.1)
LYMPH, POC: 1.3 (ref 0.6–3.4)
MCH, POC: 29.8 pg (ref 27–31.2)
MCHC: 30.8 g/dL — AB (ref 31.8–35.4)
MCV: 96.9 fL (ref 80–97)
MID (CBC): 0.3 (ref 0–0.9)
MPV: 9.2 fL (ref 0–99.8)
POC GRANULOCYTE: 3.9 (ref 2–6.9)
POC LYMPH PERCENT: 23 %L (ref 10–50)
POC MID %: 5.6 % (ref 0–12)
Platelet Count, POC: 186 10*3/uL (ref 142–424)
RBC: 4.83 M/uL (ref 4.69–6.13)
RDW, POC: 13.9 %
WBC: 5.5 10*3/uL (ref 4.6–10.2)

## 2013-02-22 LAB — COMPREHENSIVE METABOLIC PANEL
ALK PHOS: 47 U/L (ref 39–117)
ALT: 41 U/L (ref 0–53)
AST: 20 U/L (ref 0–37)
Albumin: 4.8 g/dL (ref 3.5–5.2)
BUN: 13 mg/dL (ref 6–23)
CALCIUM: 10 mg/dL (ref 8.4–10.5)
CHLORIDE: 102 meq/L (ref 96–112)
CO2: 27 mEq/L (ref 19–32)
CREATININE: 0.91 mg/dL (ref 0.50–1.35)
Glucose, Bld: 162 mg/dL — ABNORMAL HIGH (ref 70–99)
Potassium: 4.7 mEq/L (ref 3.5–5.3)
Sodium: 138 mEq/L (ref 135–145)
Total Bilirubin: 0.6 mg/dL (ref 0.2–1.2)
Total Protein: 7.3 g/dL (ref 6.0–8.3)

## 2013-02-22 LAB — LIPID PANEL
Cholesterol: 182 mg/dL (ref 0–200)
HDL: 46 mg/dL (ref >39)
LDL Cholesterol: 111 mg/dL — ABNORMAL HIGH (ref 0–99)
Total CHOL/HDL Ratio: 4 Ratio
Triglycerides: 124 mg/dL (ref <150)
VLDL: 25 mg/dL (ref 0–40)

## 2013-02-22 LAB — POCT GLYCOSYLATED HEMOGLOBIN (HGB A1C): HEMOGLOBIN A1C: 9.4

## 2013-02-22 MED ORDER — AMOXICILLIN 875 MG PO TABS: 875.0000 mg | ORAL_TABLET | Freq: Two times a day (BID) | ORAL | Status: DC

## 2013-02-22 MED ORDER — CILIDINIUM-CHLORDIAZEPOXIDE 2.5-5 MG PO CAPS: 1.0000 | ORAL_CAPSULE | Freq: Three times a day (TID) | ORAL | Status: DC

## 2013-02-22 MED ORDER — GLIPIZIDE ER 10 MG PO TB24: 10.0000 mg | ORAL_TABLET | Freq: Every day | ORAL | Status: DC

## 2013-02-22 NOTE — Patient Instructions (Signed)
Recheck 3 months.

## 2013-02-22 NOTE — Progress Notes (Addendum)
Subjective:    Patient ID: [name], male    DOB: [birth year], 68 y.o.   MRN: [id number]  HPI This chart was scribed for [name] Lin, MD by Thea Alken, ED Scribe. This patient was seen in room 9 and the patient's care was started at 11:25 AM.  HPI Comments: [name] is a 68 y.o. male who presents to the [facility] for DM check. Pt reports that he has given up on his DM medication. He reports that the glyburide medication makes him nausea. He states that the glyburide 2.74m does not make him as nausea as the glyburide 5 mg. He states that other than glyburide making him nauseated, the medication has no effects on his blood sugar. He states that he has been lifting weights and has lost weight.  He states that he is still taking Ramipril.   Pt states that he has sinus pressure with associated drainage, congestion and cough. Pt denies numbness or swelling of the legs and feet. Pt states that he has received the shingle, flu and pneumonia vaccine.    Patient Active Problem List   Diagnosis Date Noted   DM 01/21/2009    Priority: High   ATRIAL FIBRILLATION, PAROXYSMAL 01/21/2009    Priority: High   Hypertension     Priority: Medium   IBS (irritable bowel syndrome)---some relief from Imm  03/11/2011    Priority: Medium   Overweight 03/11/2011    Priority: Medium   NASH (nonalcoholic steatohepatitis) 09/06/2011   Rotator cuff tendinitis 07/28/2011   Bradycardia    Elevated CPK    Chest pain    Ejection fraction    BPH (benign prostatic hyperplasia) 03/11/2011   Bursitis of hip 03/11/2011    Past Medical History  Diagnosis Date   Hypertension    Elevated CPK     CPK elevated with normal MB and normal troponin the past   Chest pain     Nuclear, 2006, no ischemia   Atrial fibrillation     Paroxysmal, rare episodes, sinus rhythm on flecainide, patient prefers not to take Coumadin   Arthritis     Right shoulder   Ejection  fraction     EF 55-60%,  echo, January, 2011   Bradycardia     April, 2013   Allergies  Allergen Reactions   Lipitor [Atorvastatin]     Myopathy Spring 2006   Prior to Admission medications   Medication Sig Start Date End Date Taking? Authorizing Provider  amoxicillin (AMOXIL) 875 MG tablet Take 1 tablet (875 mg total) by mouth 2 (two) times daily. 08/19/12  Yes RLeandrew Koyanagi MD  diphenoxylate-atropine (LOMOTIL) 2.5-0.025 MG per tablet take 1 tablet by mouth four times a day if needed 08/27/12  Yes RLeandrew Koyanagi MD  flecainide (TAMBOCOR) 150 MG tablet TAKE ONE (1) AND 1/2 TABLETS TWICE DAILY 02/10/13  Yes JCarlena Bjornstad MD  glyBURIDE (DIABETA) 2.5 MG tablet Take 1 tablet (2.5 mg total) by mouth 2 (two) times daily with a meal. 08/19/12  Yes RLeandrew Koyanagi MD  glyBURIDE-metformin (GLUCOVANCE) 5-500 MG per tablet take 1 tablet by mouth twice a day 12/13/12  Yes Ryan M Dunn, PA-C  levocetirizine (XYZAL) 5 MG tablet Take 1 tablet (5 mg total) by mouth every evening. For allergies 08/19/12  Yes RLeandrew Koyanagi MD  loperamide (IMODIUM A-D) 2 MG tablet Take 1 tablet (2 mg total) by mouth 4 (four) times daily as needed for diarrhea or  loose stools. 02/23/12  Yes Leandrew Koyanagi, MD  metFORMIN (GLUCOPHAGE) 500 MG tablet Take 1 tablet (500 mg total) by mouth 2 (two) times daily with a meal. 08/19/12  Yes Leandrew Koyanagi, MD  metoprolol succinate (TOPROL-XL) 50 MG 24 hr tablet Take 1 tablet (50 mg total) by mouth daily. PATIENT NEEDS OFFICE VISIT FOR ADDITIONAL REFILLS 02/01/13  Yes Leandrew Koyanagi, MD  ramipril (ALTACE) 5 MG capsule TAKE 1 CAPSULE DAILY. 10/03/12  Yes [name], MD  Tamsulosin HCl (FLOMAX) 0.4 MG CAPS Take 1 capsule (0.4 mg total) by mouth daily. 01/26/12  Yes Leandrew Koyanagi, MD   Review of Systems  Constitutional: Negative for fever and chills.  HENT: Positive for congestion and sinus pressure.   Respiratory: Positive for cough. Negative for shortness  of breath.   Cardiovascular: Negative for chest pain, palpitations and leg swelling.  Gastrointestinal: Negative for abdominal pain.  Genitourinary: Negative for difficulty urinating.  Neurological: Negative for headaches.  Psychiatric/Behavioral: Negative for sleep disturbance.      Objective:   Physical Exam  Nursing note and vitals reviewed. Constitutional: He is oriented to person, place, and time. He appears well-developed and well-nourished. No distress.  HENT:  Head: Normocephalic and atraumatic.  Right Ear: External ear normal.  Left Ear: External ear normal.  Mouth/Throat: Oropharynx is clear and moist.  Purulent nasal discharge  Eyes: EOM are normal.  Neck: Neck supple. No thyromegaly present.  Cardiovascular: Normal rate, regular rhythm, normal heart sounds and intact distal pulses.   No murmur heard. Pulmonary/Chest: Effort normal and breath sounds normal. No respiratory distress. He has no wheezes.  Musculoskeletal: Normal range of motion. He exhibits no edema.  Lymphadenopathy:    He has no cervical adenopathy.  Neurological: He is alert and oriented to person, place, and time. No cranial nerve deficit.  Skin: Skin is warm and dry.  Psychiatric: He has a normal mood and affect. His behavior is normal.   Results for orders placed in visit on 02/22/13  POCT CBC      Result Value Ref Range   WBC 5.5  4.6 - 10.2 K/uL   Lymph, poc 1.3  0.6 - 3.4   POC LYMPH PERCENT 23.0  10 - 50 %L   MID (cbc) 0.3  0 - 0.9   POC MID % 5.6  0 - 12 %M   POC Granulocyte 3.9  2 - 6.9   Granulocyte percent 71.4  37 - 80 %G   RBC 4.83  4.69 - 6.13 M/uL   Hemoglobin 14.4  14.1 - 18.1 g/dL   HCT, POC 46.8  43.5 - 53.7 %   MCV 96.9  80 - 97 fL   MCH, POC 29.8  27 - 31.2 pg   MCHC 30.8 (*) 31.8 - 35.4 g/dL   RDW, POC 13.9     Platelet Count, POC 186  142 - 424 K/uL   MPV 9.2  0 - 99.8 fL  POCT GLYCOSYLATED HEMOGLOBIN (HGB A1C)      Result Value Ref Range   Hemoglobin A1C 9.4          Assessment & Plan:  I have completed the patient encounter in its entirety as documented by the scribe, with editing by me where necessary. Glennis P. Laney Pastor, M.D.  Type II or unspecified type diabetes mellitus without mention of complication, uncontrolled - Plan:   Comprehensive metabolic panel, Lipid panel  Discontinue metformin due to GI intolerance  Trial of Glucotrol  XL 10-discussed hypoglycemia  Continue home monitoring of blood sugar in the morning  Repeat hemoglobin A1c in 3 months  Continue efforts at weight loss  Acute sinusitis, unspecified   Amoxicillin IBS  Trial of Librax Meds ordered this encounter  Medications   clidinium-chlordiazePOXIDE (LIBRAX) 5-2.5 MG per capsule    Sig: Take 1 capsule by mouth 3 (three) times daily before meals.    Dispense:  90 capsule    Refill:  3   glipiZIDE (GLUCOTROL XL) 10 MG 24 hr tablet    Sig: Take 1 tablet (10 mg total) by mouth daily with breakfast.    Dispense:  30 tablet    Refill:  3   amoxicillin (AMOXIL) 875 MG tablet    Sig: Take 1 tablet (875 mg total) by mouth 2 (two) times daily.    Dispense:  20 tablet    Refill:  0

## 2013-02-25 ENCOUNTER — Encounter: Payer: Self-pay | Admitting: Internal Medicine

## 2013-03-27 ENCOUNTER — Other Ambulatory Visit: Payer: Self-pay | Admitting: Internal Medicine

## 2013-03-28 NOTE — Telephone Encounter (Signed)
Faxed

## 2013-04-09 ENCOUNTER — Other Ambulatory Visit: Payer: Self-pay | Admitting: Internal Medicine

## 2013-04-11 ENCOUNTER — Other Ambulatory Visit: Payer: Self-pay | Admitting: Internal Medicine

## 2013-04-15 ENCOUNTER — Other Ambulatory Visit: Payer: Self-pay | Admitting: *Deleted

## 2013-04-15 MED ORDER — RAMIPRIL 5 MG PO CAPS: ORAL_CAPSULE | ORAL | Status: DC

## 2013-04-18 ENCOUNTER — Other Ambulatory Visit: Payer: Self-pay | Admitting: Physician Assistant

## 2013-04-21 ENCOUNTER — Encounter: Payer: Self-pay | Admitting: Cardiology

## 2013-04-21 ENCOUNTER — Ambulatory Visit (INDEPENDENT_AMBULATORY_CARE_PROVIDER_SITE_OTHER): Payer: Medicare Other | Admitting: Cardiology

## 2013-04-21 ENCOUNTER — Telehealth: Payer: Self-pay

## 2013-04-21 VITALS — BP 141/77 | HR 51 | Ht 68.0 in | Wt 200.8 lb

## 2013-04-21 DIAGNOSIS — I4891 Unspecified atrial fibrillation: Secondary | ICD-10-CM | POA: Diagnosis not present

## 2013-04-21 DIAGNOSIS — E785 Hyperlipidemia, unspecified: Secondary | ICD-10-CM

## 2013-04-21 DIAGNOSIS — I10 Essential (primary) hypertension: Secondary | ICD-10-CM | POA: Diagnosis not present

## 2013-04-21 DIAGNOSIS — R748 Abnormal levels of other serum enzymes: Secondary | ICD-10-CM | POA: Diagnosis not present

## 2013-04-21 DIAGNOSIS — R001 Bradycardia, unspecified: Secondary | ICD-10-CM

## 2013-04-21 DIAGNOSIS — I498 Other specified cardiac arrhythmias: Secondary | ICD-10-CM | POA: Diagnosis not present

## 2013-04-21 MED ORDER — PRAVASTATIN SODIUM 20 MG PO TABS: 20.0000 mg | ORAL_TABLET | Freq: Every evening | ORAL | Status: DC

## 2013-04-21 MED ORDER — ROSUVASTATIN CALCIUM 5 MG PO TABS: 5.0000 mg | ORAL_TABLET | Freq: Every day | ORAL | Status: DC

## 2013-04-21 MED ORDER — FLUTICASONE PROPIONATE 50 MCG/ACT NA SUSP: 1.0000 | NASAL | Status: DC | PRN

## 2013-04-21 NOTE — Assessment & Plan Note (Signed)
Blood pressure is controlled. No change in therapy. 

## 2013-04-21 NOTE — Telephone Encounter (Addendum)
Pt had OV today with Dr Ron Parker and was advised to start taking Pravastatin 20 mg QHS. The pt states that he took Crestor in the past and would like to take that instead of Pravastatin. Per Dr Ron Parker this is ok. Both Pravastatin and Crestor was faxed to the pts pharmacy per the pts request to see which will be less expensive and the pt states that he will call the office back to let us know which statin he is taking so it can be updated in his pts chart.

## 2013-04-21 NOTE — Assessment & Plan Note (Signed)
The patient is on high-dose flecainide. We have carefully documented his levels over time. This dose will be continued. He's having no significant symptoms. He has had definite symptoms in the past with his arrhythmia. I talked with him again about anticoagulation. He does have an increased risk score. I've recommended anticoagulation to him. He does not want to use this during the time when he is working with the type of job that he has. I told him that we would continue to discuss this issue at each visit.

## 2013-04-21 NOTE — Assessment & Plan Note (Signed)
He has mild asymptomatic bradycardia. No change in therapy.

## 2013-04-21 NOTE — Assessment & Plan Note (Signed)
In the past the patient had significant symptoms from Lipitor. I calculated his 10 year risk score. It is quite high greater than 20%. I feel strongly that he should be on a statin if possible. I had along discussion with him about this. We've decided to try using a small dose of pravastatin. He will stop it if he feels poorly.  As part of this evaluation today I spent greater than 25 minutes with is total care. Most of the 25 minutes has been spent with a long discussion about anticoagulation and statin therapy.

## 2013-04-21 NOTE — Progress Notes (Signed)
Patient ID: [name], male   DOB: 09-07-1945, 68 y.o.   MRN: [id number]    HPI  Patient is seen today to followup history of paroxysmal atrial fibrillation and dyslipidemia. I followed the patient for many many years. He has a history of atrial fibrillation in the past. He has been on flecainide and he has absolutely no symptoms. We do have him on a high dose. We have documented that his level is well within the accepted range on his current dose. Over the years he has always decided he did not want anticoagulation during the years when he is doing the type of work that he does. He's not having any chest pain or shortness of breath.  Allergies  Allergen Reactions  . Lipitor [Atorvastatin]     Myopathy Spring 2006    Current Outpatient Prescriptions  Medication Sig Dispense Refill  . amoxicillin (AMOXIL) 875 MG tablet Take 1 tablet (875 mg total) by mouth 2 (two) times daily.  20 tablet  0  . aspirin 81 MG tablet Take 81 mg by mouth daily.      . clidinium-chlordiazePOXIDE (LIBRAX) 5-2.5 MG per capsule Take 1 capsule by mouth 3 (three) times daily before meals.  90 capsule  3  . diphenoxylate-atropine (LOMOTIL) 2.5-0.025 MG per tablet take 1 tablet by mouth four times a day if needed  60 tablet  3  . flecainide (TAMBOCOR) 150 MG tablet TAKE ONE (1) AND 1/2 TABLETS TWICE DAILY  90 tablet  1  . fluticasone (FLONASE) 50 MCG/ACT nasal spray 1 spray as needed.      Marland Kitchen glipiZIDE (GLUCOTROL XL) 10 MG 24 hr tablet Take 1 tablet (10 mg total) by mouth daily with breakfast.  30 tablet  3  . glyBURIDE-metformin (GLUCOVANCE) 5-500 MG per tablet Take 1 tablet by mouth 3 (three) times daily.      . metoprolol succinate (TOPROL-XL) 50 MG 24 hr tablet Take 50 mg by mouth daily.      . ramipril (ALTACE) 5 MG capsule TAKE 1 CAPSULE DAILY.  90 capsule  0  . Tamsulosin HCl (FLOMAX) 0.4 MG CAPS Take 1 capsule (0.4 mg total) by mouth daily.  30 capsule  0   No current facility-administered medications for this  visit.    History   Social History  . Marital Status: Married    Spouse Name: N/A    Number of Children: N/A  . Years of Education: N/A   Occupational History  . Not on file.   Social History Main Topics  . Smoking status: Never Smoker   . Smokeless tobacco: Not on file  . Alcohol Use: Not on file  . Drug Use: Not on file  . Sexual Activity: Not on file   Other Topics Concern  . Not on file   Social History Narrative  . No narrative on file    History reviewed. No pertinent family history.  Past Medical History  Diagnosis Date  . Hypertension   . Elevated CPK     CPK elevated with normal MB and normal troponin the past  . Chest pain     Nuclear, 2006, no ischemia  . Atrial fibrillation     Paroxysmal, rare episodes, sinus rhythm on flecainide, patient prefers not to take Coumadin  . Arthritis     Right shoulder  . Ejection fraction     EF 55-60%,  echo, January, 2011  . Bradycardia     April, 2013    History reviewed.  No pertinent past surgical history.  Patient Active Problem List   Diagnosis Date Noted  . NASH (nonalcoholic steatohepatitis) 09/06/2011  . Rotator cuff tendinitis 07/28/2011  . Bradycardia   . Elevated CPK   . Chest pain   . Hypertension   . Ejection fraction   . BPH (benign prostatic hyperplasia) 03/11/2011  . IBS (irritable bowel syndrome) 03/11/2011  . Overweight 03/11/2011  . Bursitis of hip 03/11/2011  . DM 01/21/2009  . ATRIAL FIBRILLATION, PAROXYSMAL 01/21/2009    ROS   Patient denies fever, chills, headache, sweats, rash, change in vision, change in hearing, chest pain, cough, nausea vomiting, urinary symptoms. All other systems are reviewed and are negative.  PHYSICAL EXAM  Patient is oriented to person time and place. Affect is normal. He is mildly overweight. There is no jugulovenous distention. Lungs are clear. Respiratory effort is nonlabored. Cardiac exam reveals S1 and S2. There no clicks or significant murmurs. The  abdomen is soft. There is no peripheral edema. There no musculoskeletal deformities. There are no skin rashes.  Filed Vitals:   04/21/13 1347  BP: 141/77  Pulse: 51  Height: 5\' 8"  (1.727 m)  Weight: 200 lb 12.8 oz (91.082 kg)   EKG is done today and reviewed by me. There is sinus bradycardia. The QRS is normal. The QT interval is normal.  ASSESSMENT & PLAN

## 2013-04-21 NOTE — Patient Instructions (Addendum)
**Note De-Identified Brettany Sydney Obfuscation** Your physician has recommended you make the following change in your medication: start taking Pravastatin 20 mg at bedtime.  Your physician recommends that you return for lab work in: June 09, 2013. Please do not eat or drink after midnight the night before labs are drawn.  Your physician wants you to follow-up in: 1 year. You will receive a reminder letter in the mail two months in advance. If you don't receive a letter, please call our office to schedule the follow-up appointment.

## 2013-04-21 NOTE — Assessment & Plan Note (Signed)
In the past the patient has had an elevated CPK with a normal MB and troponin. His last CPK when checked was normal.

## 2013-04-25 ENCOUNTER — Telehealth: Payer: Self-pay | Admitting: Cardiology

## 2013-04-25 NOTE — Telephone Encounter (Signed)
New message     Pt choose pravastatin---crestor is too expensive. He was supposed to call back with the medication he want to replace crestor.

## 2013-04-25 NOTE — Telephone Encounter (Signed)
**Note De-Identified Pheng Prokop Obfuscation** Crestor removed from the pts med list.

## 2013-05-29 ENCOUNTER — Other Ambulatory Visit: Payer: Self-pay | Admitting: *Deleted

## 2013-05-29 MED ORDER — FLECAINIDE ACETATE 150 MG PO TABS: ORAL_TABLET | ORAL | Status: DC

## 2013-06-09 ENCOUNTER — Other Ambulatory Visit (INDEPENDENT_AMBULATORY_CARE_PROVIDER_SITE_OTHER): Payer: Medicare Other

## 2013-06-09 ENCOUNTER — Telehealth: Payer: Self-pay | Admitting: Cardiology

## 2013-06-09 DIAGNOSIS — R748 Abnormal levels of other serum enzymes: Secondary | ICD-10-CM | POA: Diagnosis not present

## 2013-06-09 DIAGNOSIS — E785 Hyperlipidemia, unspecified: Secondary | ICD-10-CM

## 2013-06-09 LAB — LIPID PANEL
CHOL/HDL RATIO: 4
Cholesterol: 132 mg/dL (ref 0–200)
HDL: 37.4 mg/dL — AB (ref >39.00)
LDL Cholesterol: 80 mg/dL (ref 0–99)
NONHDL: 94.6
TRIGLYCERIDES: 72 mg/dL (ref 0.0–149.0)
VLDL: 14.4 mg/dL (ref 0.0–40.0)

## 2013-06-09 LAB — CK: Total CK: 211 U/L (ref 7–232)

## 2013-06-09 NOTE — Telephone Encounter (Signed)
**Note De-Identified [name] Obfuscation** The pt states that he came in for lab work this am and sat in the waiting room for almost a half an hour before he got up and went up to the desk to ask what was taking so long. According to his chart he arrived at 7:51 and was checked in at 8:13.  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 He states that in the past he would just let the front staff know that he was here for blood work and was sent back without a wait.  He states that he feels he was over looked and wants [name] and the office manager to know about this.  Will forward note to [name], office manager as [name].

## 2013-06-09 NOTE — Telephone Encounter (Signed)
New message      Talk to a nurse about having to wait so long for lab work this am.

## 2013-06-17 ENCOUNTER — Other Ambulatory Visit: Payer: Self-pay | Admitting: Internal Medicine

## 2013-07-28 ENCOUNTER — Other Ambulatory Visit: Payer: Self-pay | Admitting: Internal Medicine

## 2013-07-29 ENCOUNTER — Other Ambulatory Visit: Payer: Self-pay | Admitting: *Deleted

## 2013-07-29 MED ORDER — METOPROLOL SUCCINATE ER 50 MG PO TB24: 50.0000 mg | ORAL_TABLET | Freq: Every day | ORAL | Status: DC

## 2013-07-31 ENCOUNTER — Other Ambulatory Visit: Payer: Self-pay

## 2013-07-31 MED ORDER — RAMIPRIL 5 MG PO CAPS: ORAL_CAPSULE | ORAL | Status: DC

## 2013-08-22 ENCOUNTER — Other Ambulatory Visit: Payer: Self-pay | Admitting: Internal Medicine

## 2013-08-29 ENCOUNTER — Other Ambulatory Visit: Payer: Self-pay | Admitting: Internal Medicine

## 2013-08-29 ENCOUNTER — Ambulatory Visit (INDEPENDENT_AMBULATORY_CARE_PROVIDER_SITE_OTHER): Payer: Medicare Other | Admitting: Internal Medicine

## 2013-08-29 VITALS — BP 134/86 | HR 60 | Temp 98.4°F | Resp 16 | Ht 67.0 in | Wt 191.4 lb

## 2013-08-29 DIAGNOSIS — Z79899 Other long term (current) drug therapy: Secondary | ICD-10-CM

## 2013-08-29 DIAGNOSIS — IMO0001 Reserved for inherently not codable concepts without codable children: Secondary | ICD-10-CM | POA: Diagnosis not present

## 2013-08-29 DIAGNOSIS — E119 Type 2 diabetes mellitus without complications: Secondary | ICD-10-CM

## 2013-08-29 DIAGNOSIS — I10 Essential (primary) hypertension: Secondary | ICD-10-CM | POA: Diagnosis not present

## 2013-08-29 DIAGNOSIS — K589 Irritable bowel syndrome without diarrhea: Secondary | ICD-10-CM

## 2013-08-29 DIAGNOSIS — E1165 Type 2 diabetes mellitus with hyperglycemia: Secondary | ICD-10-CM

## 2013-08-29 DIAGNOSIS — J019 Acute sinusitis, unspecified: Secondary | ICD-10-CM

## 2013-08-29 LAB — POCT GLYCOSYLATED HEMOGLOBIN (HGB A1C): HEMOGLOBIN A1C: 9.1

## 2013-08-29 MED ORDER — GLIPIZIDE-METFORMIN HCL 2.5-250 MG PO TABS: 1.0000 | ORAL_TABLET | Freq: Two times a day (BID) | ORAL | Status: DC

## 2013-08-29 MED ORDER — AMOXICILLIN 875 MG PO TABS: 875.0000 mg | ORAL_TABLET | Freq: Two times a day (BID) | ORAL | Status: DC

## 2013-08-29 MED ORDER — CILIDINIUM-CHLORDIAZEPOXIDE 2.5-5 MG PO CAPS: 1.0000 | ORAL_CAPSULE | Freq: Three times a day (TID) | ORAL | Status: DC

## 2013-08-29 MED ORDER — DIPHENOXYLATE-ATROPINE 2.5-0.025 MG PO TABS: ORAL_TABLET | ORAL | Status: DC

## 2013-08-29 NOTE — Progress Notes (Signed)
Subjective:  This chart was scribed for [name], MD, by Neta Ehlers, ED Scribe. This patient's care was started at 3:58 PM.   Patient ID: [name], male    DOB: 06-Oct-1945, 68 y.o.   MRN: [id number]  HPI  Chief Complaint  Patient presents with  . Sinusitis    Sinus press;ear press;HA; nasal/cough-creamy yellow x 1 1/2 wk  . Medication Refill    Librax 5/2.5 mg; Lomotil 2.5/0.025 mg Pt is fasting   [name] is a 68 y.o. male who presents to [facility] complaining of sinus pressure which has persisted for approximately a week and which has been associated with an "aching" right-sided otalgia.   He also reports GI difficulty after transitioning to Metformin, and he requests returning to the medication he was formerly prescribed. He also requests information about a semiweekly injection to help regulate his glucose levels, and he wonders if the medication is covered by Medicare. The pt  states he is not currently taking Metformin due to the GI symptoms. [name] asserts that he functions better when his glucose levels are between 150-180; he states he feels hypoglycemic when his levels are below 130.   Re his IBS, the pt reports the medication prescribed to him has improved his symptoms.   [name] reports he recently had his cholesterol measured. He states it is improved.   He states his BP is improved when he is at rest. In the office it is 134/86.   He also endorses walking for approximately an hour a day. He denies pain or swelling to his lower extremities.   He has an appointment next month for a colonoscopy.   [name] reports he recently returned from [place] which he visited with his wife and grandchildren.  T  Past Medical History  Diagnosis Date  . Hypertension   . Elevated CPK     CPK elevated with normal MB and normal troponin the past  . Chest pain     Nuclear, 2006, no ischemia  . Atrial fibrillation     Paroxysmal, rare episodes, sinus rhythm on  flecainide, patient prefers not to take Coumadin  . Arthritis     Right shoulder  . Ejection fraction     EF 55-60%,  echo, January, 2011  . Bradycardia     April, 2013   History reviewed. No pertinent past surgical history.  Current Outpatient Prescriptions on File Prior to Visit  Medication Sig Dispense Refill  . aspirin 81 MG tablet Take 81 mg by mouth daily.      . flecainide (TAMBOCOR) 150 MG tablet TAKE ONE (1) AND 1/2 TABLETS TWICE DAILY  90 tablet  5  . fluticasone (FLONASE) 50 MCG/ACT nasal spray Place 1 spray into both nostrils as needed.  16 g  0  . metoprolol succinate (TOPROL-XL) 50 MG 24 hr tablet Take 1 tablet (50 mg total) by mouth daily.  30 tablet  5  . pravastatin (PRAVACHOL) 20 MG tablet Take 1 tablet (20 mg total) by mouth every evening.  30 tablet  11  . ramipril (ALTACE) 5 MG capsule TAKE 1 CAPSULE DAILY.  90 capsule  3  . Tamsulosin HCl (FLOMAX) 0.4 MG CAPS Take 1 capsule (0.4 mg total) by mouth daily.  30 capsule  0   No current facility-administered medications on file prior to visit.   Review of Systems  Constitutional: Negative for fever and chills.  Otherwise, see HPI.      Objective:  Physical Exam BP 134/86  Pulse 60  Temp(Src) 98.4 F (36.9 C) (Oral)  Resp 16  Ht 5\' 7"  (1.702 m)  Wt 191 lb 6.4 oz (86.818 kg)  BMI 29.97 kg/m2  SpO2 98%   Physical Exam  Nursing note and vitals reviewed. Constitutional: He is oriented to person, place, and time. He appears well-developed and well-nourished. No distress.  HENT: Purulent discharge present in nares. Oropharynx clear.  Head: Normocephalic and atraumatic. Tenderness to frontal sinus to percussion.  Eyes: Conjunctivae and EOM are normal.  Neck: Neck supple. No tracheal deviation present.  Cardiovascular: Normal rate.  Normal rhythm. Heart sounds normal. No murmur.  Pulmonary/Chest: Effort normal. No respiratory distress. No rales, rhonchi, or wheezes.  Musculoskeletal: Normal range of motion.  Full peripheral pulses. No sensory loss to extremities. No edema present in extremities.  Neurological: He is alert and oriented to person, place, and time.  Skin: Skin is warm and dry.  Psychiatric: He has a normal mood and affect. His behavior is normal.   Vitals: BP 134/86  Pulse 60  Temp(Src) 98.4 F (36.9 C) (Oral)  Resp 16  Ht 5\' 7"  (1.702 m)  Wt 191 lb 6.4 oz (86.818 kg)  BMI 29.97 kg/m2  SpO2 98%     Results for orders placed in visit on 08/29/13  POCT GLYCOSYLATED HEMOGLOBIN (HGB A1C)      Result Value Ref Range   Hemoglobin A1C 9.1    has remained the same despite stopping his meds  Assessment & Plan:  I have completed the patient encounter in its entirety as documented by the scribe, with editing by me where necessary. Farhan P. Laney Pastor, M.D. DM - Plan: POCT glycosylated hemoglobin (Hb A1C)  Type II or unspecified type diabetes mellitus without mention of complication, uncontrolled  Essential hypertension  IBS (irritable bowel syndrome)  Medication management  Acute sinusitis, unspecified  Meds ordered this encounter  Medications  . clidinium-chlordiazePOXIDE (LIBRAX) 5-2.5 MG per capsule    Sig: Take 1 capsule by mouth 3 (three) times daily before meals.    Dispense:  90 capsule    Refill:  3  . diphenoxylate-atropine (LOMOTIL) 2.5-0.025 MG per tablet    Sig: take 1 tablet by mouth four times a day if needed    Dispense:  60 tablet    Refill:  3  . amoxicillin (AMOXIL) 875 MG tablet    Sig: Take 1 tablet (875 mg total) by mouth 2 (two) times daily.    Dispense:  20 tablet    Refill:  0  . glipizide-metformin (METAGLIP) 2.5-250 MG per tablet    Sig: Take 1 tablet by mouth 2 (two) times daily before a meal.    Dispense:  60 tablet    Refill:  5  will start low/increase slowly-focus on wt loss 51mos

## 2013-09-05 ENCOUNTER — Telehealth: Payer: Self-pay

## 2013-09-05 NOTE — Telephone Encounter (Signed)
BLOOD SUGAR MEDICATION THIS WEEK IS AVG. 125 AND MEDICATION IS WORKING. WANTED TO LET DR. Laney Pastor KNOW.

## 2013-09-20 ENCOUNTER — Telehealth: Payer: Self-pay

## 2013-09-20 ENCOUNTER — Other Ambulatory Visit: Payer: Self-pay | Admitting: Internal Medicine

## 2013-09-20 NOTE — Telephone Encounter (Signed)
Patient would like a refill on

## 2013-09-20 NOTE — Telephone Encounter (Signed)
Patient requesting a refill on BP medication. States it is working quite well. CB # [phone]

## 2013-09-21 NOTE — Telephone Encounter (Signed)
Dr. Laney Pastor, pt called requesting a RF of Bydureon. 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 Used his last one yesterday, but I don't see anywhere where we rx'ed it. Please advise. Thanks

## 2013-09-21 NOTE — Telephone Encounter (Signed)
Left message on machine to call back to see what he needs RF'ed. Looks like Dr. Ron Parker office sent in 6 mos of Metoprolol on 7/28 and 1 yr of Altace on 7/30.

## 2013-09-22 NOTE — Telephone Encounter (Signed)
This is glyburide plus metformin and he has 5 refills available

## 2013-09-22 NOTE — Telephone Encounter (Signed)
This was handwritten by Dr. Laney Pastor. There is a RF request in here from pharm. Dr. Laney Pastor notified and rx request sent to him.

## 2013-09-22 NOTE — Telephone Encounter (Signed)
Pt calling in regards to medication, says its working really well, would like to have refilled

## 2013-10-01 ENCOUNTER — Telehealth: Payer: Self-pay | Admitting: *Deleted

## 2013-10-01 NOTE — Telephone Encounter (Signed)
Spoke to patient about diabetes, per patient he saw Dr Laney Pastor 5 weeks ago. His medication for diabetes was changed and his glucose is better.

## 2013-10-15 ENCOUNTER — Other Ambulatory Visit: Payer: Self-pay | Admitting: Internal Medicine

## 2013-10-18 ENCOUNTER — Other Ambulatory Visit: Payer: Self-pay

## 2013-10-18 MED ORDER — FLUTICASONE PROPIONATE 50 MCG/ACT NA SUSP: 1.0000 | NASAL | Status: DC | PRN

## 2013-11-03 ENCOUNTER — Ambulatory Visit (INDEPENDENT_AMBULATORY_CARE_PROVIDER_SITE_OTHER): Payer: Medicare Other | Admitting: Internal Medicine

## 2013-11-03 VITALS — BP 132/72 | HR 84 | Temp 98.6°F | Resp 16 | Ht 67.0 in | Wt 197.0 lb

## 2013-11-03 DIAGNOSIS — N400 Enlarged prostate without lower urinary tract symptoms: Secondary | ICD-10-CM | POA: Diagnosis not present

## 2013-11-03 DIAGNOSIS — E1365 Other specified diabetes mellitus with hyperglycemia: Secondary | ICD-10-CM

## 2013-11-03 DIAGNOSIS — M25512 Pain in left shoulder: Secondary | ICD-10-CM

## 2013-11-03 DIAGNOSIS — IMO0002 Reserved for concepts with insufficient information to code with codable children: Secondary | ICD-10-CM

## 2013-11-03 MED ORDER — METHYLPREDNISOLONE ACETATE 80 MG/ML IJ SUSP
80.0000 mg | Freq: Once | INTRAMUSCULAR | Status: AC
Start: 2013-11-03 — End: 2013-11-03
  Administered 2013-11-03: 80 mg via INTRA_ARTICULAR

## 2013-11-03 MED ORDER — TAMSULOSIN HCL 0.4 MG PO CAPS: 0.4000 mg | ORAL_CAPSULE | Freq: Every day | ORAL | Status: DC

## 2013-11-03 MED ORDER — GLYBURIDE-METFORMIN 1.25-250 MG PO TABS: 1.0000 | ORAL_TABLET | Freq: Two times a day (BID) | ORAL | Status: DC

## 2013-11-03 NOTE — Progress Notes (Signed)
Subjective:  This chart was scribed for Leandrew Koyanagi, MD by Ladene Artist, ED Scribe. The patient was seen in room 9. Patient's care was started at 5:38 PM.   Patient ID: [name], male    DOB: 19-Aug-1945, 68 y.o.   MRN: [id number]  Chief Complaint  Patient presents with   Shoulder Pain    left, x 2 months getting worse   Diabetes    follow up   HPI  HPI Comments: [name] is a 68 y.o. male who presents to the [facility] for DM follow-up. Med change 2.5 mo ago with addition of bydureon. Pt reports that his blood sugar averages 114-135. He reports that blood sugar was in the 70s last week. He denies weakness, dizziness, any other side effects. Uses injec qoweek with met250bid and glybur 2.5 qd---hx glipiz not working and higher doses metf =gi intol.  Shoulder Pt also presents with constant, gradually worsening L shoulder pain onset 2 months ago. Pain is exacerbated with lifting his arm and playing golf. He reports similar pain in the R shoulder that has improved with injections. Pt is R hand dominant. Pain at night.  Past Medical History  Diagnosis Date   Hypertension    Elevated CPK     CPK elevated with normal MB and normal troponin the past   Chest pain     Nuclear, 2006, no ischemia   Atrial fibrillation     Paroxysmal, rare episodes, sinus rhythm on flecainide, patient prefers not to take Coumadin   Arthritis     Right shoulder   Ejection fraction     EF 55-60%,  echo, January, 2011   Bradycardia     April, 2013   Current Outpatient Prescriptions on File Prior to Visit  Medication Sig Dispense Refill   amoxicillin (AMOXIL) 875 MG tablet Take 1 tablet (875 mg total) by mouth 2 (two) times daily. 20 tablet 0   aspirin 81 MG tablet Take 81 mg by mouth daily.     BYDUREON 2 MG SUSR INJECT ONCE A WEEK 4 each 3   clidinium-chlordiazePOXIDE (LIBRAX) 5-2.5 MG per capsule Take 1 capsule by mouth 3 (three) times daily before  meals. 90 capsule 3   diphenoxylate-atropine (LOMOTIL) 2.5-0.025 MG per tablet take 1 tablet by mouth four times a day if needed 60 tablet 3   flecainide (TAMBOCOR) 150 MG tablet TAKE ONE (1) AND 1/2 TABLETS TWICE DAILY 90 tablet 5   fluticasone (FLONASE) 50 MCG/ACT nasal spray Place 1 spray into both nostrils as needed. 16 g 1   glipizide-metformin (METAGLIP) 2.5-250 MG per tablet Take 1 tablet by mouth 2 (two) times daily before a meal. 60 tablet 5   metoprolol succinate (TOPROL-XL) 50 MG 24 hr tablet Take 1 tablet (50 mg total) by mouth daily. 30 tablet 5   pravastatin (PRAVACHOL) 20 MG tablet Take 1 tablet (20 mg total) by mouth every evening. 30 tablet 11   ramipril (ALTACE) 5 MG capsule TAKE 1 CAPSULE DAILY. 90 capsule 3   Tamsulosin HCl (FLOMAX) 0.4 MG CAPS Take 1 capsule (0.4 mg total) by mouth daily. 30 capsule 0   No current facility-administered medications on file prior to visit.   Allergies  Allergen Reactions   Lipitor [Atorvastatin]     Myopathy Spring 2006   Review of Systems  Musculoskeletal: Positive for arthralgias.  Neurological: Negative for dizziness and weakness.  GU-has had recurr of nocturia x 2 w/decr stream and  hesitation and would like to resume tamsulosin as rx by urology    Objective:   Physical Exam  Constitutional: He is oriented to person, place, and time. He appears well-developed and well-nourished. No distress.  HENT:  Head: Normocephalic and atraumatic.  Eyes: Conjunctivae and EOM are normal.  Neck: Neck supple.  Pulmonary/Chest: Effort normal. No respiratory distress.  Musculoskeletal: Normal range of motion.  L shoulder pain on ABduction to 90 degrees and against resistant External rotation is full ADduction is full No crepitus with ROM Tender over posterior aspect of shoulder  Neurological: He is alert and oriented to person, place, and time.  Skin: Skin is warm and dry.  Psychiatric: He has a normal mood and affect. His behavior  is normal.  Nursing note and vitals reviewed.  Triage Vitals: BP 132/72 mmHg   Pulse 84   Temp(Src) 98.6 F (37 C)   Resp 16   Ht 5' 7"  (1.702 m)   Wt 197 lb (89.359 kg)   BMI 30.85 kg/m2   SpO2 98%  Procedure: sterile field/injected 3cclido plus 1 cc depomed80 post approach w/out compliactions Good rom 5 min after!!    Assessment & Plan:   I personally performed the services described in this documentation, which was scribed in my presence. The recorded information has been reviewed and is accurate. Left shoulder pain - Plan: methylPREDNISolone acetate (DEPO-MEDROL) injection 80 mg  BPH (benign prostatic hyperplasia) - Plan: tamsulosin (FLOMAX) 0.4 MG CAPS capsule  AODM--stable  A1C in 3 mos  Meds ordered this encounter  Medications   methylPREDNISolone acetate (DEPO-MEDROL) injection 80 mg    Sig:    tamsulosin (FLOMAX) 0.4 MG CAPS capsule    Sig: Take 1 capsule (0.4 mg total) by mouth daily.    Dispense:  30 capsule    Refill:  5   glyBURIDE-metformin (GLUCOVANCE) 1.25-250 MG per tablet    Sig: Take 1 tablet by mouth 2 (two) times daily with a meal.    Dispense:  180 tablet    Refill:  3

## 2013-11-06 ENCOUNTER — Telehealth: Payer: Self-pay

## 2013-11-06 NOTE — Telephone Encounter (Signed)
PA was approved, but only through 01/01/14. Notified pharmacy. For info purposes for next PA, glipizide was ineffective for pt, and higher doses of metformin cause intolerable GI problems.

## 2013-11-06 NOTE — Telephone Encounter (Signed)
PA needed for glyburide-metformin. Completed on covermymeds. Asked for expedited review, pending.

## 2013-11-20 ENCOUNTER — Telehealth: Payer: Self-pay

## 2013-11-20 NOTE — Telephone Encounter (Signed)
Called pharm and they stated pt has reached the gap in coverage for the year. Called pt and he stated that he needs the PA to be done for next year's coverage. [employer] ID # [id number]. Pt has tried many different things. Glipizide was ineffective. Glyburide caused too much GI SEs when increased to dose needed.

## 2013-11-20 NOTE — Telephone Encounter (Signed)
Patient is requesting that a clinical nurse or Dr. Laney Pastor call his insurance [employer] [phone]) to ask for formulary exception for his blood sugar medication BYDUREON 2 MG SUSR in order for insurance to cover it. He says is insurance will charge [phone] for it if we dont call and explain that the medication has helped patient for 2 years to get his blood sugar down to (87-110) now.   Please call patient when this is done and if you have any questions:   [phone]

## 2013-11-24 NOTE — Telephone Encounter (Signed)
[facility] and they are faxing correct form to complete.

## 2013-11-25 NOTE — Telephone Encounter (Signed)
Completed form and faxed back to [facility]. Pending.

## 2013-11-28 NOTE — Telephone Encounter (Signed)
PA was denied bc pt has not already tried/failed preferred formulary meds including Trulicity and Victoza. Dr Laney Pastor, do you want to try one of these?

## 2013-12-01 NOTE — Telephone Encounter (Signed)
Patient called to let Dr Laney Pastor know he has spoken with other people whom have taken "Trulicity" and 'Victoza". Per patient he has been told these medications have caused stomach upset. Patient already deals with his IBS and doesn't want to to upset anymore. Patients call back number is [phone]

## 2013-12-02 NOTE — Telephone Encounter (Signed)
The difference is these others are once a week dosing--the GI toxicity is usually worse with the daily dosing---he may have no choice tho if he reacts to other we will have case for going back to current

## 2013-12-04 NOTE — Telephone Encounter (Signed)
Dr Laney Pastor, I'm not sure if I understand what you are saying. Are you saying that Turlicity and victoza normally cause GI problems with QD dosing, but the pt will only be dosing once a week and should therefore have a decreased chance of GI upset? In which case if it is not tolerated well, we could re-do the PA w/this info? Did you want to Rx one of these meds now to put on hold for the beginning of the year, or wait?

## 2013-12-09 NOTE — Telephone Encounter (Signed)
The once a week doses are less likely to cause GI upset so he should be ok to try one of these whenever ready for next dosing

## 2013-12-10 NOTE — Telephone Encounter (Signed)
Advised pt of Dr Marlaine Hind' explanation concerning GI upset and pt stated he is willing to try which ever one Dr Laney Pastor thinks is best. He has enough Bydureon for this month, and will call us the week bf he runs out so that we can get the new one Rxd.

## 2013-12-15 ENCOUNTER — Telehealth: Payer: Self-pay

## 2013-12-15 NOTE — Telephone Encounter (Signed)
Left message for patient to get his flu shot.

## 2014-01-05 ENCOUNTER — Other Ambulatory Visit: Payer: Self-pay | Admitting: Internal Medicine

## 2014-01-08 NOTE — Telephone Encounter (Signed)
Dr Laney Pastor, pt called and LM stating he has used his last dose of Bydureon and he would like to get a Rx sent for either Trulicity or victoza sent to pharm for him to try.

## 2014-01-09 ENCOUNTER — Telehealth: Payer: Self-pay | Admitting: Internal Medicine

## 2014-01-09 MED ORDER — DULAGLUTIDE 0.75 MG/0.5ML ~~LOC~~ SOAJ: 0.7500 mg | SUBCUTANEOUS | Status: DC

## 2014-01-09 NOTE — Telephone Encounter (Signed)
Patient called asking to speak with the clinical nurse. States he has several questions about his medications.  [phone]

## 2014-01-09 NOTE — Addendum Note (Signed)
Addended by: Leandrew Koyanagi on: 01/09/2014 01:36 PM   Modules accepted: Orders, Medications

## 2014-01-09 NOTE — Telephone Encounter (Signed)
Insurance forcing change in diabetic therapy Meds ordered this encounter  Medications  . Dulaglutide (TRULICITY) 1.22 QM/2.5OI SOPN    Sig: Inject 0.75 mg into the skin once a week.    Dispense:  0.75 pen    Refill:  12

## 2014-01-09 NOTE — Telephone Encounter (Signed)
Notified pt of new Rx and he will call us if he has any problems with it.

## 2014-01-10 NOTE — Telephone Encounter (Signed)
Pt said he spoke with someone yesterday and his questions had already been answered.

## 2014-01-12 ENCOUNTER — Telehealth: Payer: Self-pay

## 2014-01-12 NOTE — Telephone Encounter (Signed)
I had called pt about another med, and he asked that I send a message to Dr Laney Pastor to ask him whether he thinks Xifaxan that he heard about on TV would help the digestive cramping he gets and then urgency to have BM/diarrhea immediately afterward? He reports that the medication he has been using doesn't really control it. Please advise.

## 2014-01-12 NOTE — Telephone Encounter (Signed)
PA also needed for Trulicity. Completed on covermymeds. Pending. Notified pt of status.

## 2014-01-14 ENCOUNTER — Encounter: Payer: Self-pay | Admitting: Internal Medicine

## 2014-01-14 NOTE — Telephone Encounter (Signed)
At current time this is used by GI specialists after they have done an eval for why he needs something this strong

## 2014-01-14 NOTE — Telephone Encounter (Signed)
Pt advised that he p/up trulicity yesterday, but that he had called [employer] when he read that one of SEs can be diarrhea and he already has IBS. [employer] is doing another PA on his request and will let him know whether they will agree to cover Bydureon afterall.

## 2014-01-14 NOTE — Telephone Encounter (Signed)
Ins faxed notice that no PA is required for Trulicity. Notified pharm and pt

## 2014-01-15 NOTE — Telephone Encounter (Signed)
Called pt and asked if he would like referral to GI specialist (per staff message from Dr Laney Pastor). He stated that he will just keep taking the two meds he is using now that are helping a lot because he thinks this new med is very expensive. If he has increasing problem while still using his current meds, he will let us know and we can refer then. Dr Laney Pastor, Juluis Rainier

## 2014-01-20 ENCOUNTER — Telehealth: Payer: Self-pay

## 2014-01-20 ENCOUNTER — Other Ambulatory Visit: Payer: Self-pay

## 2014-01-20 MED ORDER — FLECAINIDE ACETATE 150 MG PO TABS: ORAL_TABLET | ORAL | Status: DC

## 2014-01-20 NOTE — Telephone Encounter (Signed)
Patient says the appeal was approved for bydurean. He said do not refill until the end of the month

## 2014-02-18 ENCOUNTER — Ambulatory Visit (INDEPENDENT_AMBULATORY_CARE_PROVIDER_SITE_OTHER): Payer: Medicare Other | Admitting: Internal Medicine

## 2014-02-18 ENCOUNTER — Encounter: Payer: Self-pay | Admitting: Internal Medicine

## 2014-02-18 VITALS — BP 148/74 | HR 62 | Temp 98.2°F | Resp 16 | Ht 67.5 in | Wt 201.0 lb

## 2014-02-18 DIAGNOSIS — J309 Allergic rhinitis, unspecified: Secondary | ICD-10-CM | POA: Diagnosis not present

## 2014-02-18 DIAGNOSIS — E119 Type 2 diabetes mellitus without complications: Secondary | ICD-10-CM

## 2014-02-18 DIAGNOSIS — I10 Essential (primary) hypertension: Secondary | ICD-10-CM | POA: Diagnosis not present

## 2014-02-18 DIAGNOSIS — M25512 Pain in left shoulder: Secondary | ICD-10-CM | POA: Diagnosis not present

## 2014-02-18 DIAGNOSIS — N400 Enlarged prostate without lower urinary tract symptoms: Secondary | ICD-10-CM | POA: Diagnosis not present

## 2014-02-18 DIAGNOSIS — E785 Hyperlipidemia, unspecified: Secondary | ICD-10-CM

## 2014-02-18 DIAGNOSIS — K589 Irritable bowel syndrome without diarrhea: Secondary | ICD-10-CM | POA: Diagnosis not present

## 2014-02-18 DIAGNOSIS — E663 Overweight: Secondary | ICD-10-CM

## 2014-02-18 LAB — CBC WITH DIFFERENTIAL/PLATELET
BASOS ABS: 0.1 10*3/uL (ref 0.0–0.1)
BASOS PCT: 1 % (ref 0–1)
Eosinophils Absolute: 0.1 10*3/uL (ref 0.0–0.7)
Eosinophils Relative: 2 % (ref 0–5)
HCT: 43.7 % (ref 39.0–52.0)
Hemoglobin: 15.1 g/dL (ref 13.0–17.0)
Lymphocytes Relative: 24 % (ref 12–46)
Lymphs Abs: 1.4 10*3/uL (ref 0.7–4.0)
MCH: 31.1 pg (ref 26.0–34.0)
MCHC: 34.6 g/dL (ref 30.0–36.0)
MCV: 90.1 fL (ref 78.0–100.0)
MONO ABS: 0.5 10*3/uL (ref 0.1–1.0)
MPV: 9.8 fL (ref 8.6–12.4)
Monocytes Relative: 9 % (ref 3–12)
Neutro Abs: 3.8 10*3/uL (ref 1.7–7.7)
Neutrophils Relative %: 64 % (ref 43–77)
Platelets: 225 10*3/uL (ref 150–400)
RBC: 4.85 MIL/uL (ref 4.22–5.81)
RDW: 13.7 % (ref 11.5–15.5)
WBC: 5.9 10*3/uL (ref 4.0–10.5)

## 2014-02-18 LAB — POCT GLYCOSYLATED HEMOGLOBIN (HGB A1C): Hemoglobin A1C: 8.2

## 2014-02-18 MED ORDER — GLYBURIDE-METFORMIN 5-500 MG PO TABS: 1.0000 | ORAL_TABLET | Freq: Every day | ORAL | Status: DC

## 2014-02-18 MED ORDER — PRAVASTATIN SODIUM 20 MG PO TABS: 20.0000 mg | ORAL_TABLET | Freq: Every evening | ORAL | Status: DC

## 2014-02-18 MED ORDER — TAMSULOSIN HCL 0.4 MG PO CAPS: 0.4000 mg | ORAL_CAPSULE | Freq: Every day | ORAL | Status: DC

## 2014-02-18 MED ORDER — CILIDINIUM-CHLORDIAZEPOXIDE 2.5-5 MG PO CAPS: 1.0000 | ORAL_CAPSULE | Freq: Three times a day (TID) | ORAL | Status: DC

## 2014-02-18 MED ORDER — FLUTICASONE PROPIONATE 50 MCG/ACT NA SUSP: 1.0000 | NASAL | Status: DC | PRN

## 2014-02-18 MED ORDER — DIPHENOXYLATE-ATROPINE 2.5-0.025 MG PO TABS: ORAL_TABLET | ORAL | Status: DC

## 2014-02-18 NOTE — Patient Instructions (Signed)
Website- [email or web address] Also, ask your pharmacist if your medication is less expensive not run through your [facility] card.

## 2014-02-18 NOTE — Progress Notes (Addendum)
Subjective:    Patient ID: [name], male    DOB: 01-26-1945, 69 y.o.   MRN: [id number]  HPIf/u Patient Active Problem List   Diagnosis Date Noted  . DM type 2 (diabetes mellitus, type 2) 01/21/2009    Priority: High  . ATRIAL FIBRILLATION, PAROXYSMAL 01/21/2009    Priority: High  . Hypertension     Priority: Medium  . IBS (irritable bowel syndrome) 03/11/2011    Priority: Medium  . Overweight 03/11/2011    Priority: Medium  . Dyslipidemia 04/21/2013  . NASH (nonalcoholic steatohepatitis) 09/06/2011  . Rotator cuff tendinitis 07/28/2011  . BPH (benign prostatic hyperplasia) 03/11/2011  . Bursitis of hip 03/11/2011    Patient presents today for follow up of DM. He has been using Bydureon with good results keeping his blood sugars in the low 100s. His insurance then refused to cover this and He was switched to trilicity causing increased appetite, weight gain, upset stomach. His blood sugars have been running 210-215.  He is interested in going back on oral agents because he doesn't want to try victosa which is the only other entrance alternative   Has had chronic sinus drainage. Had a recent amoxil prescription. Uses flonase every night and occasional neti pot. Feels congested at night and runs clear drainage during the day.   Playing golf regularly. Has tendonitis symptoms in left shoulder. Had injection in posterior aspect 11/15 with good relief, but continues to have pain anteriorly. Especially with overhead movements  Plays golf whenever the weather cooperates and continues to work part time.   Review of Systems No chest pain, no SOB, + sinus drainage, no cough, no edema, no palpitations.   no vision changes No headaches No change in activity level or endurance His irritable bowel symptoms seem to be stable on his current medicines No extremity weakness or numbness  Objective:   Physical Exam  Constitutional: He appears well-developed and well-nourished.  HENT:    Head: Normocephalic.  Right Ear: Tympanic membrane, external ear and ear canal normal.  Left Ear: Tympanic membrane, external ear and ear canal normal.  Nose: Mucosal edema present. Right sinus exhibits no maxillary sinus tenderness and no frontal sinus tenderness. Left sinus exhibits no maxillary sinus tenderness and no frontal sinus tenderness.  Thick white post nasal drainage.   Vitals reviewed. BP 160/81 mmHg  Pulse 62  Temp(Src) 98.2 F (36.8 C)  Resp 16  Ht 5' 7.5" (1.715 m)  Wt 201 lb (91.173 kg)  BMI 31.00 kg/m2  SpO2 98%  His heart is regular Lungs are clear No peripheral edema No numbness of the feet to filament testing His left shoulder has pain with abduction and anterior elevation above 75 He is tender over the anterior aspect of the shoulder including the biceps tendon There is no swelling or erythema No loss of strength in the left arm  Results for orders placed or performed in visit on 02/18/14  POCT glycosylated hemoglobin (Hb A1C)  Result Value Ref Range   Hemoglobin A1C 8.2  he was 9.1 in August       Procedure-with informed consent and a sterile field he was injected with 3 mL of Xylocaine 2% +80 mg of Depo-Medrol. The injection produced good results with restored pain-free range of motion within 5 minutes.  Assessment & Plan:   Problem #1 type 2 diabetes uncontrolled  We will resume Glucovance at a higher dose and recheck his A1c in 3 months  He is asked to  achieve better weight control  Problem #2 hyperlipidemia  Continue Pravachol and recheck current lipid profile  Problem #3 irritable bowel syndrome  Successful control with Lomotil and Librax  Problem #4 rotator cuff tendinitis-chronic recurrent  If he fails to have long-lasting effects with this injection and his continued exercises we will refer him for ultrasound and  potential nitroglycerin patches  Problem #5 allergic rhinitis  Flonase plus OTC antihistamines if tolerated  Problem #5  BPH  Continue Flomax  Meds ordered this encounter  Medications  . BYDUREON 2 MG SUSR----he is no longer on this as his insurance refused to cover it     Sig:     Refill:  0  . tamsulosin (FLOMAX) 0.4 MG CAPS capsule    Sig: Take 1 capsule (0.4 mg total) by mouth daily.    Dispense:  90 capsule    Refill:  0    Order Specific Question:  Supervising Provider    Answer:  Wardell Honour [[id number]]  . pravastatin (PRAVACHOL) 20 MG tablet    Sig: Take 1 tablet (20 mg total) by mouth every evening.    Dispense:  90 tablet    Refill:  0    Order Specific Question:  Supervising Provider    Answer:  Wardell Honour [[id number]]  . fluticasone (FLONASE) 50 MCG/ACT nasal spray    Sig: Place 1 spray into both nostrils as needed.    Dispense:  16 g    Refill:  1    Order Specific Question:  Supervising Provider    Answer:  Wardell Honour [[id number]]  . diphenoxylate-atropine (LOMOTIL) 2.5-0.025 MG per tablet    Sig: take 1 tablet by mouth four times a day if needed    Dispense:  180 tablet    Refill:  0    Order Specific Question:  Supervising Provider    Answer:  Wardell Honour [[id number]]  . clidinium-chlordiazePOXIDE (LIBRAX) 5-2.5 MG per capsule    Sig: Take 1 capsule by mouth 3 (three) times daily before meals.    Dispense:  270 capsule    Refill:  0    Order Specific Question:  Supervising Provider    Answer:  Wardell Honour [[id number]]  . glyBURIDE-metformin (GLUCOVANCE) 5-500 MG per tablet    Sig: Take 1 tablet by mouth daily with breakfast.    Dispense:  180 tablet    Refill:  0    Order Specific Question:  Supervising Provider    Answer:  Wardell Honour [[id number]]    Addendum-labs Results for orders placed or performed in visit on 02/18/14  CBC with Differential/Platelet  Result Value Ref Range   WBC 5.9 4.0 - 10.5 K/uL   RBC 4.85 4.22 - 5.81 MIL/uL   Hemoglobin 15.1 13.0 - 17.0 g/dL   HCT 43.7 39.0 - 52.0 %   MCV 90.1 78.0 - 100.0 fL   MCH 31.1 26.0 - 34.0 pg   MCHC 34.6 30.0 - 36.0 g/dL    RDW 13.7 11.5 - 15.5 %   Platelets 225 150 - 400 K/uL   MPV 9.8 8.6 - 12.4 fL   Neutrophils Relative % 64 43 - 77 %   Neutro Abs 3.8 1.7 - 7.7 K/uL   Lymphocytes Relative 24 12 - 46 %   Lymphs Abs 1.4 0.7 - 4.0 K/uL   Monocytes Relative 9 3 - 12 %   Monocytes Absolute 0.5 0.1 - 1.0 K/uL   Eosinophils Relative 2 0 - 5 %  Eosinophils Absolute 0.1 0.0 - 0.7 K/uL   Basophils Relative 1 0 - 1 %   Basophils Absolute 0.1 0.0 - 0.1 K/uL   Smear Review Criteria for review not met   Comprehensive metabolic panel  Result Value Ref Range   Sodium 136 135 - 145 mEq/L   Potassium 4.4 3.5 - 5.3 mEq/L   Chloride 99 96 - 112 mEq/L   CO2 24 19 - 32 mEq/L   Glucose, Bld 115 (H) 70 - 99 mg/dL   BUN 13 6 - 23 mg/dL   Creat 0.91 0.50 - 1.35 mg/dL   Total Bilirubin 0.6 0.2 - 1.2 mg/dL   Alkaline Phosphatase 54 39 - 117 U/L   AST 32 0 - 37 U/L   ALT 61 (H) 0 - 53 U/L   Total Protein 7.1 6.0 - 8.3 g/dL   Albumin 4.5 3.5 - 5.2 g/dL   Calcium 9.4 8.4 - 10.5 mg/dL  Lipid panel  Result Value Ref Range   Cholesterol 168 0 - 200 mg/dL   Triglycerides 187 (H) <150 mg/dL   HDL 38 (L) >39 mg/dL   Total CHOL/HDL Ratio 4.4 Ratio   VLDL 37 0 - 40 mg/dL   LDL Cholesterol 93 0 - 99 mg/dL  PSA  Result Value Ref Range   PSA 1.11 <=4.00 ng/mL  POCT glycosylated hemoglobin (Hb A1C)  Result Value Ref Range   Hemoglobin A1C 8.2    FNP Dreon Pineda did the Hx and HEENT exam  I did the rest,  and supervised her refilling of prescriptions

## 2014-02-19 LAB — LIPID PANEL
CHOL/HDL RATIO: 4.4 ratio
CHOLESTEROL: 168 mg/dL (ref 0–200)
HDL: 38 mg/dL — AB (ref >39)
LDL Cholesterol: 93 mg/dL (ref 0–99)
Triglycerides: 187 mg/dL — ABNORMAL HIGH (ref <150)
VLDL: 37 mg/dL (ref 0–40)

## 2014-02-19 LAB — COMPREHENSIVE METABOLIC PANEL
ALBUMIN: 4.5 g/dL (ref 3.5–5.2)
ALT: 61 U/L — AB (ref 0–53)
AST: 32 U/L (ref 0–37)
Alkaline Phosphatase: 54 U/L (ref 39–117)
BUN: 13 mg/dL (ref 6–23)
CHLORIDE: 99 meq/L (ref 96–112)
CO2: 24 meq/L (ref 19–32)
Calcium: 9.4 mg/dL (ref 8.4–10.5)
Creat: 0.91 mg/dL (ref 0.50–1.35)
Glucose, Bld: 115 mg/dL — ABNORMAL HIGH (ref 70–99)
POTASSIUM: 4.4 meq/L (ref 3.5–5.3)
Sodium: 136 mEq/L (ref 135–145)
TOTAL PROTEIN: 7.1 g/dL (ref 6.0–8.3)
Total Bilirubin: 0.6 mg/dL (ref 0.2–1.2)

## 2014-02-19 LAB — PSA: PSA: 1.11 ng/mL (ref <=4.00)

## 2014-02-22 ENCOUNTER — Encounter: Payer: Self-pay | Admitting: Internal Medicine

## 2014-03-02 ENCOUNTER — Telehealth: Payer: Self-pay

## 2014-03-02 NOTE — Telephone Encounter (Signed)
Pt states we usually mail out his labs and he hasn't received anything. Really need [place] to mail them out for him Also have questions regarding his METFORMIN medication. Please call [phone]

## 2014-03-02 NOTE — Telephone Encounter (Signed)
Pt notified that letter was mailed 2/22. Was concerned that his ins would not pay for his glyburide-metformin. It is much cheaper for him to pay out of pocket with no insurance.

## 2014-03-05 ENCOUNTER — Telehealth: Payer: Self-pay

## 2014-03-05 NOTE — Telephone Encounter (Signed)
Patient had been waiting for lab results in the mail for two weeks and still hasn't received anything although it was confirmed that it was mailed. Patient would like for someone to call him. [phone]

## 2014-03-06 NOTE — Telephone Encounter (Signed)
Left message on machine to call back  

## 2014-03-06 NOTE — Telephone Encounter (Signed)
Re-mailed letter for pt

## 2014-03-17 NOTE — Telephone Encounter (Signed)
Pt called back and stated that [facility] requires a PA for glyburide-metformin 5-500 that was Rxd. His ins # is [id number]. I copied the following hx from last PA I did: For info purposes for next PA, glipizide was ineffective for pt, and higher doses of metformin cause intolerable GI problems. I completed PA on covermymeds. Pending.

## 2014-03-18 NOTE — Telephone Encounter (Signed)
Received form for add'l info on PA. Completed and faxed back. Pending.

## 2014-03-19 NOTE — Telephone Encounter (Signed)
PA approved through 01/02/15. Notified pt.

## 2014-03-30 DIAGNOSIS — H2513 Age-related nuclear cataract, bilateral: Secondary | ICD-10-CM | POA: Diagnosis not present

## 2014-03-30 DIAGNOSIS — E119 Type 2 diabetes mellitus without complications: Secondary | ICD-10-CM | POA: Diagnosis not present

## 2014-03-30 DIAGNOSIS — H524 Presbyopia: Secondary | ICD-10-CM | POA: Diagnosis not present

## 2014-03-30 DIAGNOSIS — H5203 Hypermetropia, bilateral: Secondary | ICD-10-CM | POA: Diagnosis not present

## 2014-03-30 DIAGNOSIS — H52223 Regular astigmatism, bilateral: Secondary | ICD-10-CM | POA: Diagnosis not present

## 2014-03-30 LAB — HM DIABETES EYE EXAM

## 2014-04-01 ENCOUNTER — Other Ambulatory Visit: Payer: Self-pay | Admitting: Internal Medicine

## 2014-04-22 ENCOUNTER — Ambulatory Visit (INDEPENDENT_AMBULATORY_CARE_PROVIDER_SITE_OTHER): Payer: Medicare Other | Admitting: Cardiology

## 2014-04-22 ENCOUNTER — Encounter: Payer: Self-pay | Admitting: Cardiology

## 2014-04-22 VITALS — BP 160/80 | HR 49 | Ht 68.0 in | Wt 200.0 lb

## 2014-04-22 DIAGNOSIS — Z79899 Other long term (current) drug therapy: Secondary | ICD-10-CM | POA: Diagnosis not present

## 2014-04-22 DIAGNOSIS — R079 Chest pain, unspecified: Secondary | ICD-10-CM

## 2014-04-22 DIAGNOSIS — I10 Essential (primary) hypertension: Secondary | ICD-10-CM

## 2014-04-22 DIAGNOSIS — I480 Paroxysmal atrial fibrillation: Secondary | ICD-10-CM

## 2014-04-22 DIAGNOSIS — E785 Hyperlipidemia, unspecified: Secondary | ICD-10-CM

## 2014-04-22 NOTE — Assessment & Plan Note (Signed)
Blood pressure in the office today is mildly elevated. He insists that his pressure at home was in the range of 120/75 on a regular basis. No change in therapy.

## 2014-04-22 NOTE — Assessment & Plan Note (Signed)
He has had some right anterior chest discomfort. I'm not convinced that it represents angina. However we need to rule this out. We also need more information about his LV function. We will proceed with a stress echo.

## 2014-04-22 NOTE — Assessment & Plan Note (Signed)
In the past he has had significant symptoms from Lipitor. I've been able to convince him to take a small dose of Pravachol.

## 2014-04-22 NOTE — Assessment & Plan Note (Addendum)
He has not had a documented episode of atrial fib for many many years. He is on high-dose flecainide. Level will be checked. At this point he is not on an anticoagulant. I have documented repeatedly over the years that he and I have discussed this issue. He does have risk factors that warrant anticoagulation. 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 Now he tells me that he is concerned about anticoagulants in general. He has held sinus rhythm for many years. This does not change his risk. I told him that the issue of anticoagulation will be reviewed again by the new physician that he sees on our team after I retire.

## 2014-04-22 NOTE — Progress Notes (Signed)
Cardiology Office Note   Date:  04/22/2014   ID:  [name], DOB 10/07/1945, MRN [id number]  PCP:  Leandrew Koyanagi, MD  Cardiologist:  Dola Argyle, MD   Chief Complaint  Patient presents with  . Appointment    Follow-up paroxysmal atrial fibrillation      History of Present Illness: [name] is a 69 y.o. male who presents to follow-up a history of atrial fibrillation in the past. He has had no palpitations. He does mention some vague right chest pain. He did have it after working in his yard for a long period of time. It is resolved. He is not having any shortness of breath.  There are several aspects of his care that I document at the time of each visit. He does have atrial fib risk factors. However he has been adamant over time that he does not want to use an anticoagulant unless he has to. Clinically he has had no return of his atrial fibrillation for several years. Of course this does not rule out the possibility that he is having atrial fib. In addition his flecainide dose is high. I have repeatedly documented that his level is in a good range.    Past Medical History  Diagnosis Date  . Hypertension   . Elevated CPK     CPK elevated with normal MB and normal troponin the past  . Chest pain     Nuclear, 2006, no ischemia  . Atrial fibrillation     Paroxysmal, rare episodes, sinus rhythm on flecainide, patient prefers not to take Coumadin  . Arthritis     Right shoulder  . Ejection fraction     EF 55-60%,  echo, January, 2011  . Bradycardia     April, 2013    History reviewed. No pertinent past surgical history.  Patient Active Problem List   Diagnosis Date Noted  . Dyslipidemia 04/21/2013  . NASH (nonalcoholic steatohepatitis) 09/06/2011  . Rotator cuff tendinitis 07/28/2011  . Bradycardia   . Elevated CPK   . Chest pain   . Hypertension   . Ejection fraction   . BPH (benign prostatic hyperplasia) 03/11/2011  . IBS (irritable bowel syndrome)  03/11/2011  . Overweight 03/11/2011  . Bursitis of hip 03/11/2011  . DM type 2 (diabetes mellitus, type 2) 01/21/2009  . ATRIAL FIBRILLATION, PAROXYSMAL 01/21/2009      Current Outpatient Prescriptions  Medication Sig Dispense Refill  . aspirin 81 MG tablet Take 81 mg by mouth daily.    . clidinium-chlordiazePOXIDE (LIBRAX) 5-2.5 MG per capsule Take 1 capsule by mouth 3 (three) times daily before meals. 270 capsule 0  . diphenoxylate-atropine (LOMOTIL) 2.5-0.025 MG per tablet take 1 tablet by mouth four times a day if needed 180 tablet 0  . flecainide (TAMBOCOR) 150 MG tablet TAKE ONE (1) AND 1/2 TABLETS TWICE DAILY 90 tablet 5  . fluticasone (FLONASE) 50 MCG/ACT nasal spray Place 1 spray into both nostrils as needed. 16 g 1  . glyBURIDE-metformin (GLUCOVANCE) 5-500 MG per tablet Take 1 tablet by mouth daily with breakfast. (Patient taking differently: Take 1 tablet by mouth 2 (two) times daily with a meal. Take 1/2 tablet six times daily) 180 tablet 0  . metoprolol succinate (TOPROL-XL) 50 MG 24 hr tablet Take 1 tablet (50 mg total) by mouth daily. 30 tablet 5  . pravastatin (PRAVACHOL) 20 MG tablet Take 1 tablet (20 mg total) by mouth every evening. 90 tablet 0  . ramipril (ALTACE)  5 MG capsule TAKE 1 CAPSULE DAILY. 90 capsule 3  . tamsulosin (FLOMAX) 0.4 MG CAPS capsule Take 1 capsule (0.4 mg total) by mouth daily. 90 capsule 0   No current facility-administered medications for this visit.    Allergies:   Lipitor    Social History:  The patient  reports that he has never smoked. He does not have any smokeless tobacco history on file.   Family History:  The patient's family history includes Alzheimer's disease in his mother; Cancer in his brother; Emphysema in his father.    ROS:  Please see the history of present illness.     Patient denies fever, chills, headache, sweats, rash, change in vision, change in hearing, cough, nausea or vomiting, urinary symptoms. All other systems are  reviewed and are negative.   PHYSICAL EXAM: VS:  BP 160/80 mmHg  Pulse 49  Ht 5\' 8"  (1.727 m)  Wt 200 lb (90.719 kg)  BMI 30.42 kg/m2 , Patient is oriented to person time and place. Affect is normal. He reports that his blood pressure at home and at work are always in the range of 408 systolic. He notes that his primary physician also feels that he has white coat syndrome. He is oriented to person time and place. Affect is normal. Head is atraumatic. Sclera and conjunctiva are normal. There is no jugular venous distention. Lungs are clear. Respiratory effort is nonlabored. Cardiac exam reveals S1 and S2. Abdomen is soft. There is no peripheral edema. There are no musculoskeletal deformities. There are no skin rashes. Neurologic is grossly intact.  EKG:   EKG reveals sinus bradycardia. There are nonspecific ST-T wave changes. There is no change from the past.   Recent Labs: 02/18/2014: ALT 61*; BUN 13; Creatinine 0.91; Hemoglobin 15.1; Platelets 225; Potassium 4.4; Sodium 136    Lipid Panel    Component Value Date/Time   CHOL 168 02/18/2014 1519   TRIG 187* 02/18/2014 1519   HDL 38* 02/18/2014 1519   CHOLHDL 4.4 02/18/2014 1519   VLDL 37 02/18/2014 1519   LDLCALC 93 02/18/2014 1519      Wt Readings from Last 3 Encounters:  04/22/14 200 lb (90.719 kg)  02/18/14 201 lb (91.173 kg)  11/03/13 197 lb (89.359 kg)      Current medicines are reviewed  The patient understands his medications.     ASSESSMENT AND PLAN:

## 2014-04-22 NOTE — Patient Instructions (Signed)
**Note De-Identified Verdell Dykman Obfuscation** Medication Instructions:  None  Labwork: Tomorrow between 7:30 and 5 (Please do not take your Flecainide dose in the morning until your lab work has been done)  Testing/Procedures: Your physician has requested that you have a stress echocardiogram. For further information please visit [email or web address]. Please follow instruction sheet as given.   Follow-Up: Your physician wants you to follow-up in: 1 year. You will receive a reminder letter in the mail two months in advance. If you don't receive a letter, please call our office to schedule the follow-up appointment.

## 2014-04-23 ENCOUNTER — Encounter: Payer: Self-pay | Admitting: Internal Medicine

## 2014-04-23 ENCOUNTER — Other Ambulatory Visit: Payer: Self-pay | Admitting: Cardiology

## 2014-04-23 ENCOUNTER — Other Ambulatory Visit (INDEPENDENT_AMBULATORY_CARE_PROVIDER_SITE_OTHER): Payer: Medicare Other | Admitting: *Deleted

## 2014-04-23 DIAGNOSIS — Z79899 Other long term (current) drug therapy: Secondary | ICD-10-CM | POA: Diagnosis not present

## 2014-04-28 LAB — FLECAINIDE LEVEL: Flecainide: 0.51 ug/mL (ref 0.20–1.00)

## 2014-04-30 ENCOUNTER — Telehealth: Payer: Self-pay | Admitting: Cardiology

## 2014-04-30 NOTE — Telephone Encounter (Signed)
Spoke with pt and reviewed flecainide level results with him

## 2014-04-30 NOTE — Telephone Encounter (Signed)
New message ° ° ° ° °Returning a nurses call to get lab results °

## 2014-04-30 NOTE — Telephone Encounter (Signed)
No answer and no voicemail on home phone or cell phone.  Will continue to try to reach pt

## 2014-04-30 NOTE — Telephone Encounter (Signed)
Follow Up    Pt returning call from earlier. Please call.

## 2014-05-04 ENCOUNTER — Encounter: Payer: Self-pay | Admitting: Internal Medicine

## 2014-05-04 ENCOUNTER — Encounter: Payer: Self-pay | Admitting: Cardiology

## 2014-05-05 ENCOUNTER — Telehealth (HOSPITAL_COMMUNITY): Payer: Self-pay | Admitting: *Deleted

## 2014-05-05 NOTE — Telephone Encounter (Signed)
Patient given detailed instructions per Myocardial Perfusion Study Information Sheet for test on 05/06/14 at 0730. Patient verbalized understanding. [name], Ranae Palms

## 2014-05-06 ENCOUNTER — Ambulatory Visit (HOSPITAL_BASED_OUTPATIENT_CLINIC_OR_DEPARTMENT_OTHER): Payer: Medicare Other

## 2014-05-06 ENCOUNTER — Telehealth: Payer: Self-pay | Admitting: Cardiology

## 2014-05-06 ENCOUNTER — Other Ambulatory Visit: Payer: Self-pay | Admitting: Cardiology

## 2014-05-06 ENCOUNTER — Ambulatory Visit (HOSPITAL_COMMUNITY): Payer: Medicare Other | Attending: Cardiovascular Disease

## 2014-05-06 DIAGNOSIS — R079 Chest pain, unspecified: Secondary | ICD-10-CM

## 2014-05-06 HISTORY — PX: CARDIOVASCULAR STRESS TEST: SHX262

## 2014-05-06 NOTE — Telephone Encounter (Addendum)
**Note De-Identified [name] Obfuscation** The pt states that he arrived on time for his stress echo this am at 7:30. Once he was taken to the Echo area he was told he would have to wait because "someone" made a mistake in scheduling his stress echo. He states that they took several pts ahead of him due to the scheduling error which caused his test to be 45 mins late. The pt states that he is upset that he had to wait and is asking to speak to Dr Ron Parker. The pt has been assured that Dr Ron Parker will be made aware and that I will also talk with the check out team and their manager, Cathie Beams, to find out what the issue was.  Check out, Glenna and Dr Ron Parker have all been made aware of this situation.  Will forward note to DJ, Agricultural engineer, as Juluis Rainier.

## 2014-05-06 NOTE — Telephone Encounter (Signed)
Walk in pt form-Pt would like a call regarding today's visit-gave to [facility]

## 2014-05-06 NOTE — Telephone Encounter (Signed)
Follow Up ° °Pt returned call//  °

## 2014-05-08 ENCOUNTER — Telehealth: Payer: Self-pay | Admitting: Cardiology

## 2014-05-08 ENCOUNTER — Encounter: Payer: Self-pay | Admitting: Cardiology

## 2014-05-08 LAB — ECHOCARDIOGRAM STRESS TEST
CHL CUP STRESS STAGE 1 GRADE: 0 %
CHL CUP STRESS STAGE 1 HR: 56 {beats}/min
CHL CUP STRESS STAGE 1 SBP: 157 mmHg
CHL CUP STRESS STAGE 2 DBP: 91 mmHg
CHL CUP STRESS STAGE 2 SPEED: 0 mph
CHL CUP STRESS STAGE 3 GRADE: 0 %
CHL CUP STRESS STAGE 5 GRADE: 12 %
CHL CUP STRESS STAGE 5 HR: 125 {beats}/min
CHL CUP STRESS STAGE 6 GRADE: 14 %
CHL CUP STRESS STAGE 6 HR: 134 {beats}/min
CHL CUP STRESS STAGE 6 SPEED: 3.4 mph
CHL CUP STRESS STAGE 7 SBP: 162 mmHg
CHL CUP STRESS STAGE 8 GRADE: 0 %
CSEPPMHR: 88 %
Estimated workload: 8.7 METS
Peak HR: 134 {beats}/min
Stage 1 DBP: 95 mmHg
Stage 1 Speed: 0 mph
Stage 2 Grade: 0 %
Stage 2 HR: 68 {beats}/min
Stage 2 SBP: 157 mmHg
Stage 3 HR: 68 {beats}/min
Stage 3 Speed: 0 mph
Stage 4 DBP: 75 mmHg
Stage 4 Grade: 10 %
Stage 4 HR: 101 {beats}/min
Stage 4 SBP: 132 mmHg
Stage 4 Speed: 1.7 mph
Stage 5 DBP: 75 mmHg
Stage 5 SBP: 167 mmHg
Stage 5 Speed: 2.5 mph
Stage 7 DBP: 54 mmHg
Stage 7 Grade: 0 %
Stage 7 HR: 114 {beats}/min
Stage 7 Speed: 0 mph
Stage 8 DBP: 61 mmHg
Stage 8 HR: 85 {beats}/min
Stage 8 SBP: 162 mmHg
Stage 8 Speed: 0 mph

## 2014-05-08 NOTE — Progress Notes (Signed)
After I saw the patient recently arranged for a stress echo. It has been formally read today. The study is abnormal. There are abnormal EKG changes and abnormal wall motion changes. I'm in the process of trying to reach him to finalize the possibility of proceeding with cardiac catheterization on Tuesday, May 12, 2014.  Daryel November, MD

## 2014-05-08 NOTE — Telephone Encounter (Signed)
New message      Returning [name]'s call

## 2014-05-08 NOTE — Telephone Encounter (Signed)
**Note De-Identified Vu Liebman Obfuscation** The pt is advised, per Dr Ron Parker, that we have a new system (CUPID) and that we do not have his stress echo results yet and that as soon as we receive results we will contact him. He verbalized understanding.

## 2014-05-11 ENCOUNTER — Other Ambulatory Visit (INDEPENDENT_AMBULATORY_CARE_PROVIDER_SITE_OTHER): Payer: Medicare Other | Admitting: *Deleted

## 2014-05-11 ENCOUNTER — Other Ambulatory Visit: Payer: Self-pay | Admitting: Cardiology

## 2014-05-11 ENCOUNTER — Other Ambulatory Visit: Payer: Self-pay

## 2014-05-11 DIAGNOSIS — Z7901 Long term (current) use of anticoagulants: Secondary | ICD-10-CM | POA: Diagnosis not present

## 2014-05-11 DIAGNOSIS — Z01812 Encounter for preprocedural laboratory examination: Secondary | ICD-10-CM

## 2014-05-11 DIAGNOSIS — R072 Precordial pain: Secondary | ICD-10-CM

## 2014-05-11 DIAGNOSIS — R0789 Other chest pain: Secondary | ICD-10-CM | POA: Diagnosis not present

## 2014-05-11 LAB — CBC WITH DIFFERENTIAL/PLATELET
Basophils Absolute: 0 10*3/uL (ref 0.0–0.1)
Basophils Relative: 0.4 % (ref 0.0–3.0)
Eosinophils Absolute: 0.1 10*3/uL (ref 0.0–0.7)
Eosinophils Relative: 0.9 % (ref 0.0–5.0)
HEMATOCRIT: 41.4 % (ref 39.0–52.0)
Hemoglobin: 14 g/dL (ref 13.0–17.0)
Lymphocytes Relative: 16.7 % (ref 12.0–46.0)
Lymphs Abs: 1.4 10*3/uL (ref 0.7–4.0)
MCHC: 33.8 g/dL (ref 30.0–36.0)
MCV: 89.6 fl (ref 78.0–100.0)
MONOS PCT: 7.7 % (ref 3.0–12.0)
Monocytes Absolute: 0.6 10*3/uL (ref 0.1–1.0)
NEUTROS ABS: 6.1 10*3/uL (ref 1.4–7.7)
Neutrophils Relative %: 74.3 % (ref 43.0–77.0)
Platelets: 199 10*3/uL (ref 150.0–400.0)
RBC: 4.62 Mil/uL (ref 4.22–5.81)
RDW: 13.9 % (ref 11.5–15.5)
WBC: 8.2 10*3/uL (ref 4.0–10.5)

## 2014-05-11 LAB — PROTIME-INR
INR: 1.1 ratio — AB (ref 0.8–1.0)
Prothrombin Time: 12.7 s (ref 9.6–13.1)

## 2014-05-11 LAB — BASIC METABOLIC PANEL
BUN: 14 mg/dL (ref 6–23)
CHLORIDE: 102 meq/L (ref 96–112)
CO2: 28 mEq/L (ref 19–32)
CREATININE: 1.01 mg/dL (ref 0.40–1.50)
Calcium: 9.6 mg/dL (ref 8.4–10.5)
GFR: 77.87 mL/min (ref >60.00)
Glucose, Bld: 181 mg/dL — ABNORMAL HIGH (ref 70–99)
Potassium: 4.3 mEq/L (ref 3.5–5.1)
Sodium: 135 mEq/L (ref 135–145)

## 2014-05-11 NOTE — H&P (Signed)
[name]  04/22/2014 1:45 PM  Office Visit  MRN:  [id number]   Description: Male DOB: June 24, 1945  Provider: [name], MD  Department: [facility] [address] [facility]       Vital Signs  Most recent update: 04/22/2014 1:42 PM by [name]    BP Pulse Ht Wt BMI    160/80 mmHg 49 5\' 8"  (1.727 m) 200 lb (90.719 kg) 30.42 kg/m2      Progress Notes      [name] Bjornstad, MD at 04/22/2014 2:20 PM     Status: Signed       Expand All Collapse All      Cardiology Office Note   Date: 04/22/2014   ID: [name], DOB 1945-07-22, MRN [id number]  PCP: [name] Koyanagi, MD Cardiologist: [name] Argyle, MD   Chief Complaint  Patient presents with  . Appointment    Follow-up paroxysmal atrial fibrillation     History of Present Illness: [name] is a 69 y.o. male who presents to follow-up a history of atrial fibrillation in the past. He has had no palpitations. He does mention some vague right chest pain. He did have it after working in his yard for a long period of time. It is resolved. He is not having any shortness of breath.  There are several aspects of his care that I document at the time of each visit. He does have atrial fib risk factors. However he has been adamant over time that he does not want to use an anticoagulant unless he has to. Clinically he has had no return of his atrial fibrillation for several years. Of course this does not rule out the possibility that he is having atrial fib. In addition his flecainide dose is high. I have repeatedly documented that his level is in a good range.    Past Medical History  Diagnosis Date  . Hypertension   . Elevated CPK     CPK elevated with normal MB and normal troponin the past  . Chest pain     Nuclear, 2006, no ischemia  . Atrial fibrillation     Paroxysmal, rare episodes, sinus rhythm on flecainide, patient prefers not to take Coumadin  . Arthritis      Right shoulder  . Ejection fraction     EF 55-60%, echo, January, 2011  . Bradycardia     April, 2013    History reviewed. No pertinent past surgical history.  Patient Active Problem List   Diagnosis Date Noted  . Dyslipidemia 04/21/2013  . NASH (nonalcoholic steatohepatitis) 09/06/2011  . Rotator cuff tendinitis 07/28/2011  . Bradycardia   . Elevated CPK   . Chest pain   . Hypertension   . Ejection fraction   . BPH (benign prostatic hyperplasia) 03/11/2011  . IBS (irritable bowel syndrome) 03/11/2011  . Overweight 03/11/2011  . Bursitis of hip 03/11/2011  . DM type 2 (diabetes mellitus, type 2) 01/21/2009  . ATRIAL FIBRILLATION, PAROXYSMAL 01/21/2009      Current Outpatient Prescriptions  Medication Sig Dispense Refill  . aspirin 81 MG tablet Take 81 mg by mouth daily.    . clidinium-chlordiazePOXIDE (LIBRAX) 5-2.5 MG per capsule Take 1 capsule by mouth 3 (three) times daily before meals. 270 capsule 0  . diphenoxylate-atropine (LOMOTIL) 2.5-0.025 MG per tablet take 1 tablet by mouth four times a day if needed 180 tablet 0  . flecainide (TAMBOCOR) 150 MG tablet TAKE ONE (1) AND 1/2 TABLETS TWICE DAILY 90 tablet  5  . fluticasone (FLONASE) 50 MCG/ACT nasal spray Place 1 spray into both nostrils as needed. 16 g 1  . glyBURIDE-metformin (GLUCOVANCE) 5-500 MG per tablet Take 1 tablet by mouth daily with breakfast. (Patient taking differently: Take 1 tablet by mouth 2 (two) times daily with a meal. Take 1/2 tablet six times daily) 180 tablet 0  . metoprolol succinate (TOPROL-XL) 50 MG 24 hr tablet Take 1 tablet (50 mg total) by mouth daily. 30 tablet 5  . pravastatin (PRAVACHOL) 20 MG tablet Take 1 tablet (20 mg total) by mouth every evening. 90 tablet 0  . ramipril (ALTACE) 5 MG capsule TAKE 1 CAPSULE DAILY. 90 capsule 3  . tamsulosin (FLOMAX) 0.4 MG CAPS  capsule Take 1 capsule (0.4 mg total) by mouth daily. 90 capsule 0   No current facility-administered medications for this visit.    Allergies: Lipitor    Social History: The patient  reports that he has never smoked. He does not have any smokeless tobacco history on file.   Family History: The patient's family history includes Alzheimer's disease in his mother; Cancer in his brother; Emphysema in his father.    ROS: Please see the history of present illness. Patient denies fever, chills, headache, sweats, rash, change in vision, change in hearing, cough, nausea or vomiting, urinary symptoms. All other systems are reviewed and are negative.   PHYSICAL EXAM: VS: BP 160/80 mmHg  Pulse 49  Ht 5\' 8"  (1.727 m)  Wt 200 lb (90.719 kg)  BMI 30.42 kg/m2 , Patient is oriented to person time and place. Affect is normal. He reports that his blood pressure at home and at work are always in the range of 510 systolic. He notes that his primary physician also feels that he has white coat syndrome. He is oriented to person time and place. Affect is normal. Head is atraumatic. Sclera and conjunctiva are normal. There is no jugular venous distention. Lungs are clear. Respiratory effort is nonlabored. Cardiac exam reveals S1 and S2. Abdomen is soft. There is no peripheral edema. There are no musculoskeletal deformities. There are no skin rashes. Neurologic is grossly intact.  EKG: EKG reveals sinus bradycardia. There are nonspecific ST-T wave changes. There is no change from the past.   Recent Labs: 02/18/2014: ALT 61*; BUN 13; Creatinine 0.91; Hemoglobin 15.1; Platelets 225; Potassium 4.4; Sodium 136    Lipid Panel  Labs (Brief)       Component Value Date/Time   CHOL 168 02/18/2014 1519   TRIG 187* 02/18/2014 1519   HDL 38* 02/18/2014 1519   CHOLHDL 4.4 02/18/2014 1519   VLDL 37 02/18/2014 1519   LDLCALC 93 02/18/2014 1519       Wt Readings  from Last 3 Encounters:  04/22/14 200 lb (90.719 kg)  02/18/14 201 lb (91.173 kg)  11/03/13 197 lb (89.359 kg)      Current medicines are reviewed The patient understands his medications.     ASSESSMENT AND PLAN:              ATRIAL FIBRILLATION, PAROXYSMAL - [name] Bjornstad, MD at 04/22/2014 5:47 PM     Status: Alison Stalling Related Problem: ATRIAL FIBRILLATION, PAROXYSMAL   Expand All Collapse All   He has not had a documented episode of atrial fib for many many years. He is on high-dose flecainide. Level will be checked. At this point he is not on an anticoagulant. I have documented repeatedly over the years that he and I have discussed this  issue. He does have risk factors that warrant anticoagulation. 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 Now he tells me that he is concerned about anticoagulants in general. He has held sinus rhythm for many years. This does not change his risk. I told him that the issue of anticoagulation will be reviewed again by the new physician that he sees on our team after I retire.         Revision History       Date/Time User Action    > 04/22/2014 5:52 PM [name] Bjornstad, MD Edit     04/22/2014 5:47 PM [name] Bjornstad, MD Create              Chest pain - [name] Bjornstad, MD at 04/22/2014 5:48 PM     Status: Written Related Problem: Chest pain   Expand All Collapse All   He has had some right anterior chest discomfort. I'm not convinced that it represents angina. However we need to rule this out. We also need more information about his LV function. We will proceed with a stress echo.            Hyperlipidemia - [name] Bjornstad, MD at 04/22/2014 5:50 PM     Status: Written Related Problem: Hyperlipidemia   Expand All Collapse All   In the past he has had significant symptoms from Lipitor. I've been able to convince him to take a small dose of Pravachol.            Hypertension -  [name] Bjornstad, MD at 04/22/2014 5:53 PM     Status: Written Related Problem: Hypertension   Expand All Collapse All   Blood pressure in the office today is mildly elevated. He insists that his pressure at home was in the range of 120/75 on a regular basis. No change in therapy.             Referring Provider     [name] Koyanagi, MD     Diagnoses     Paroxysmal atrial fibrillation - Primary    ICD-9-CM: 427.31 ICD-10-CM: I48.0    Long term use of drug     ICD-9-CM: V58.69 ICD-10-CM: Z79.899    Chest pain, unspecified chest pain type     ICD-9-CM: 786.50 ICD-10-CM: R07.9    Hyperlipidemia     ICD-9-CM: 272.4 ICD-10-CM: E78.5    Essential hypertension     ICD-9-CM: 401.9 ICD-10-CM: I10       Reason for Visit     Appointment    Follow-up paroxysmal atrial fibrillation    Reason for Visit History        Discontinued Medications       Reason for Discontinue    BYDUREON 2 MG SUSR Cost of medication    glyBURIDE-metformin (GLUCOVANCE) 1.25-250 MG per tablet Change in therapy      Level of Service     PR OFFICE OUTPATIENT VISIT 15 MINUTES [08657]      Follow-up and Disposition     Return in about 1 year (around 04/22/2015).       All Charges for This Encounter     Code Description Service Date Service Provider Modifiers Qty    [place], COMPLETE 04/22/2014 [name] Bjornstad, MD  [place] OFFICE OUTPATIENT VISIT 15 MINUTES 04/22/2014 [name] Bjornstad, MD  1    [phone] PR CURRENT TOBACCO NON-USER 04/22/2014 [name] Bjornstad, MD  1      AVS Reports     Date/Time Report Action User    04/22/2014 3:17 PM After Visit Summary Printed Deliah Boston Via, LPN    6/75/4492 0:10 PM After Visit Summary Printed Deliah Boston Via, LPN      Patient Instructions     Medication Instructions:  None  Labwork: Tomorrow between 7:30 and 5 (Please do not take your Flecainide dose in the  morning until your lab work has been done)  Testing/Procedures: Your physician has requested that you have a stress echocardiogram. For further information please visit [email or web address]. Please follow instruction sheet as given.   Follow-Up: Your physician wants you to follow-up in: 1 year. You will receive a reminder letter in the mail two months in advance. If you don't receive a letter, please call our office to schedule the follow-up appointment.            Routing History     There are no sent or routed communications associated with this encounter.     Previous Visit       Provider Department Encounter #    04/01/2014 5:46 AM [name] Argyle, MD [place] [id number]      The stress echo is abnormal.  We need to proceed with cath.  This will be done 05/12/2014 AM.  Daryel November, MD

## 2014-05-12 ENCOUNTER — Inpatient Hospital Stay (HOSPITAL_COMMUNITY)
Admission: RE | Admit: 2014-05-12 | Discharge: 2014-05-14 | DRG: 247 | Disposition: A | Discharge status: Home / Self Care | Payer: Medicare Other | Source: Ambulatory Visit | Attending: Cardiology | Admitting: Cardiology

## 2014-05-12 ENCOUNTER — Encounter (HOSPITAL_COMMUNITY)
Admission: RE | Disposition: A | Discharge status: Home / Self Care | Payer: No Typology Code available for payment source | Source: Ambulatory Visit | Attending: Cardiology

## 2014-05-12 DIAGNOSIS — K589 Irritable bowel syndrome without diarrhea: Secondary | ICD-10-CM | POA: Diagnosis present

## 2014-05-12 DIAGNOSIS — I480 Paroxysmal atrial fibrillation: Secondary | ICD-10-CM | POA: Diagnosis present

## 2014-05-12 DIAGNOSIS — I2510 Atherosclerotic heart disease of native coronary artery without angina pectoris: Secondary | ICD-10-CM

## 2014-05-12 DIAGNOSIS — I429 Cardiomyopathy, unspecified: Secondary | ICD-10-CM | POA: Diagnosis present

## 2014-05-12 DIAGNOSIS — E781 Pure hyperglyceridemia: Secondary | ICD-10-CM | POA: Diagnosis present

## 2014-05-12 DIAGNOSIS — E785 Hyperlipidemia, unspecified: Secondary | ICD-10-CM | POA: Diagnosis present

## 2014-05-12 DIAGNOSIS — R001 Bradycardia, unspecified: Secondary | ICD-10-CM | POA: Insufficient documentation

## 2014-05-12 DIAGNOSIS — R072 Precordial pain: Secondary | ICD-10-CM

## 2014-05-12 DIAGNOSIS — I4891 Unspecified atrial fibrillation: Secondary | ICD-10-CM | POA: Diagnosis present

## 2014-05-12 DIAGNOSIS — E119 Type 2 diabetes mellitus without complications: Secondary | ICD-10-CM

## 2014-05-12 DIAGNOSIS — I25110 Atherosclerotic heart disease of native coronary artery with unstable angina pectoris: Principal | ICD-10-CM | POA: Diagnosis present

## 2014-05-12 DIAGNOSIS — I25810 Atherosclerosis of coronary artery bypass graft(s) without angina pectoris: Secondary | ICD-10-CM | POA: Insufficient documentation

## 2014-05-12 DIAGNOSIS — Z79899 Other long term (current) drug therapy: Secondary | ICD-10-CM | POA: Diagnosis not present

## 2014-05-12 DIAGNOSIS — Z955 Presence of coronary angioplasty implant and graft: Secondary | ICD-10-CM

## 2014-05-12 DIAGNOSIS — I10 Essential (primary) hypertension: Secondary | ICD-10-CM | POA: Diagnosis present

## 2014-05-12 DIAGNOSIS — I25118 Atherosclerotic heart disease of native coronary artery with other forms of angina pectoris: Secondary | ICD-10-CM | POA: Diagnosis present

## 2014-05-12 DIAGNOSIS — Z7982 Long term (current) use of aspirin: Secondary | ICD-10-CM | POA: Diagnosis not present

## 2014-05-12 DIAGNOSIS — R9439 Abnormal result of other cardiovascular function study: Secondary | ICD-10-CM | POA: Diagnosis not present

## 2014-05-12 HISTORY — PX: CARDIAC CATHETERIZATION: SHX172

## 2014-05-12 HISTORY — DX: Other specified disorders of nose and nasal sinuses: J34.89

## 2014-05-12 HISTORY — DX: Atherosclerotic heart disease of native coronary artery without angina pectoris: I25.10

## 2014-05-12 HISTORY — DX: Irritable bowel syndrome, unspecified: K58.9

## 2014-05-12 LAB — GLUCOSE, CAPILLARY
GLUCOSE-CAPILLARY: 194 mg/dL — AB (ref 70–99)
Glucose-Capillary: 239 mg/dL — ABNORMAL HIGH (ref 70–99)
Glucose-Capillary: 138 mg/dL — ABNORMAL HIGH (ref 70–99)

## 2014-05-12 SURGERY — LEFT HEART CATH AND CORONARY ANGIOGRAPHY
Anesthesia: LOCAL

## 2014-05-12 MED ORDER — SODIUM CHLORIDE 0.9 % WEIGHT BASED INFUSION: 1.0000 mL/kg/h | INTRAVENOUS | Status: DC

## 2014-05-12 MED ORDER — RAMIPRIL 5 MG PO CAPS
5.0000 mg | ORAL_CAPSULE | Freq: Every day | ORAL | Status: DC
  Administered 2014-05-12 – 2014-05-13 (×2): 5 mg via ORAL
  Filled 2014-05-12 (×2): qty 1

## 2014-05-12 MED ORDER — FENTANYL CITRATE (PF) 100 MCG/2ML IJ SOLN
INTRAMUSCULAR | Status: DC | PRN
  Administered 2014-05-12: 25 ug via INTRAVENOUS
  Administered 2014-05-12: 50 ug via INTRAVENOUS

## 2014-05-12 MED ORDER — LOTEPREDNOL ETABONATE 0.5 % OP SUSP
1.0000 [drp] | Freq: Every day | OPHTHALMIC | Status: DC | PRN
  Filled 2014-05-12: qty 5

## 2014-05-12 MED ORDER — ONDANSETRON HCL 4 MG/2ML IJ SOLN
4.0000 mg | Freq: Four times a day (QID) | INTRAMUSCULAR | Status: DC | PRN
Start: 2014-05-12 — End: 2014-05-13

## 2014-05-12 MED ORDER — HEPARIN (PORCINE) IN NACL 100-0.45 UNIT/ML-% IJ SOLN
1200.0000 [IU]/h | INTRAMUSCULAR | Status: DC
  Administered 2014-05-13: 02:00:00 1200 [IU]/h via INTRAVENOUS
  Filled 2014-05-12 (×2): qty 250

## 2014-05-12 MED ORDER — LIDOCAINE HCL (PF) 1 % IJ SOLN
INTRAMUSCULAR | Status: AC
  Filled 2014-05-12: qty 30

## 2014-05-12 MED ORDER — PRAVASTATIN SODIUM 40 MG PO TABS
40.0000 mg | ORAL_TABLET | Freq: Every day | ORAL | Status: DC
  Administered 2014-05-12 – 2014-05-13 (×2): 40 mg via ORAL
  Filled 2014-05-12 (×3): qty 1

## 2014-05-12 MED ORDER — SODIUM CHLORIDE 0.9 % IJ SOLN: 3.0000 mL | INTRAMUSCULAR | Status: DC | PRN

## 2014-05-12 MED ORDER — CILIDINIUM-CHLORDIAZEPOXIDE 2.5-5 MG PO CAPS
1.0000 | ORAL_CAPSULE | Freq: Three times a day (TID) | ORAL | Status: DC
  Administered 2014-05-12 – 2014-05-14 (×6): 1 via ORAL
  Filled 2014-05-12 (×8): qty 1

## 2014-05-12 MED ORDER — MIDAZOLAM HCL 2 MG/2ML IJ SOLN
INTRAMUSCULAR | Status: AC
  Filled 2014-05-12: qty 2

## 2014-05-12 MED ORDER — SODIUM CHLORIDE 0.9 % IV SOLN: 250.0000 mL | INTRAVENOUS | Status: DC | PRN

## 2014-05-12 MED ORDER — NITROGLYCERIN 1 MG/10 ML FOR IR/CATH LAB
INTRA_ARTERIAL | Status: AC
  Filled 2014-05-12: qty 10

## 2014-05-12 MED ORDER — HEPARIN SODIUM (PORCINE) 1000 UNIT/ML IJ SOLN
INTRAMUSCULAR | Status: AC
Start: 2014-05-12 — End: 2014-05-12
  Filled 2014-05-12: qty 1

## 2014-05-12 MED ORDER — ASPIRIN 81 MG PO CHEW: 81.0000 mg | CHEWABLE_TABLET | ORAL | Status: DC

## 2014-05-12 MED ORDER — MIDAZOLAM HCL 2 MG/2ML IJ SOLN
INTRAMUSCULAR | Status: DC | PRN
  Administered 2014-05-12: 1 mg via INTRAVENOUS
  Administered 2014-05-12: 2 mg via INTRAVENOUS

## 2014-05-12 MED ORDER — ASPIRIN 81 MG PO CHEW
CHEWABLE_TABLET | ORAL | Status: AC
  Filled 2014-05-12: qty 1

## 2014-05-12 MED ORDER — ASPIRIN EC 81 MG PO TBEC: 81.0000 mg | DELAYED_RELEASE_TABLET | Freq: Every day | ORAL | Status: DC

## 2014-05-12 MED ORDER — ASPIRIN 81 MG PO TABS: 81.0000 mg | ORAL_TABLET | Freq: Every day | ORAL | Status: DC

## 2014-05-12 MED ORDER — FLECAINIDE ACETATE 50 MG PO TABS
225.0000 mg | ORAL_TABLET | Freq: Two times a day (BID) | ORAL | Status: DC
  Administered 2014-05-12: 23:00:00 225 mg via ORAL
  Filled 2014-05-12 (×3): qty 1

## 2014-05-12 MED ORDER — SODIUM CHLORIDE 0.9 % IV SOLN
INTRAVENOUS | Status: AC
  Administered 2014-05-12: 16:00:00 via INTRAVENOUS

## 2014-05-12 MED ORDER — FENTANYL CITRATE (PF) 100 MCG/2ML IJ SOLN
INTRAMUSCULAR | Status: AC
  Filled 2014-05-12: qty 2

## 2014-05-12 MED ORDER — TAMSULOSIN HCL 0.4 MG PO CAPS
0.4000 mg | ORAL_CAPSULE | Freq: Every day | ORAL | Status: DC
  Administered 2014-05-12 – 2014-05-14 (×3): 0.4 mg via ORAL
  Filled 2014-05-12 (×3): qty 1

## 2014-05-12 MED ORDER — VERAPAMIL HCL 2.5 MG/ML IV SOLN
INTRAVENOUS | Status: AC
  Filled 2014-05-12: qty 2

## 2014-05-12 MED ORDER — HEPARIN SODIUM (PORCINE) 1000 UNIT/ML IJ SOLN
INTRAMUSCULAR | Status: DC | PRN
  Administered 2014-05-12: 4500 [IU] via INTRAVENOUS

## 2014-05-12 MED ORDER — ATORVASTATIN CALCIUM 80 MG PO TABS: 80.0000 mg | ORAL_TABLET | Freq: Every day | ORAL | Status: DC

## 2014-05-12 MED ORDER — FLUTICASONE PROPIONATE 50 MCG/ACT NA SUSP
1.0000 | NASAL | Status: DC | PRN
  Filled 2014-05-12: qty 16

## 2014-05-12 MED ORDER — HEPARIN (PORCINE) IN NACL 2-0.9 UNIT/ML-% IJ SOLN
INTRAMUSCULAR | Status: AC
  Filled 2014-05-12: qty 1000

## 2014-05-12 MED ORDER — SODIUM CHLORIDE 0.9 % WEIGHT BASED INFUSION
3.0000 mL/kg/h | INTRAVENOUS | Status: DC
  Administered 2014-05-12: 3 mL/kg/h via INTRAVENOUS

## 2014-05-12 MED ORDER — METOPROLOL SUCCINATE ER 50 MG PO TB24
50.0000 mg | ORAL_TABLET | Freq: Every day | ORAL | Status: DC
  Administered 2014-05-12 – 2014-05-14 (×3): 50 mg via ORAL
  Filled 2014-05-12 (×3): qty 1

## 2014-05-12 MED ORDER — IOHEXOL 350 MG/ML SOLN
INTRAVENOUS | Status: DC | PRN
  Administered 2014-05-12: 90 mL via INTRAVENOUS

## 2014-05-12 MED ORDER — VERAPAMIL HCL 2.5 MG/ML IV SOLN
INTRAVENOUS | Status: DC | PRN
  Administered 2014-05-12: 15:00:00 via INTRA_ARTERIAL

## 2014-05-12 MED ORDER — ACETAMINOPHEN 325 MG PO TABS: 650.0000 mg | ORAL_TABLET | ORAL | Status: DC | PRN

## 2014-05-12 MED ORDER — SODIUM CHLORIDE 0.9 % IJ SOLN: 3.0000 mL | Freq: Two times a day (BID) | INTRAMUSCULAR | Status: DC

## 2014-05-12 SURGICAL SUPPLY — 10 items
CATH INFINITI 5FR ANG PIGTAIL (CATHETERS) ×3 IMPLANT
CATH OPTITORQUE TIG 4.0 5F (CATHETERS) ×3 IMPLANT
DEVICE RAD COMP TR BAND LRG (VASCULAR PRODUCTS) ×3 IMPLANT
GLIDESHEATH SLEND A-KIT 6F 22G (SHEATH) ×3 IMPLANT
KIT HEART LEFT (KITS) ×3 IMPLANT
PACK CARDIAC CATHETERIZATION (CUSTOM PROCEDURE TRAY) ×3 IMPLANT
SYR MEDRAD MARK V 150ML (SYRINGE) ×3 IMPLANT
TRANSDUCER W/STOPCOCK (MISCELLANEOUS) ×3 IMPLANT
TUBING CIL FLEX 10 FLL-RA (TUBING) ×3 IMPLANT
WIRE SAFE-T 1.5MM-J .035X260CM (WIRE) ×3 IMPLANT

## 2014-05-12 NOTE — Progress Notes (Signed)
ANTICOAGULATION CONSULT NOTE - Initial Consult  Pharmacy Consult for Heparin Indication: CAD  Allergies  Allergen Reactions  . Lipitor [Atorvastatin]     Myopathy Spring 2006    Patient Measurements: Height: 5\' 8"  (172.7 cm) Weight: 198 lb (89.812 kg) IBW/kg (Calculated) : 68.4 Heparin Dosing Weight: 86 kg  Vital Signs: Temp: 98.2 F (36.8 C) (05/10 1559) Temp Source: Oral (05/10 1559) BP: 155/81 mmHg (05/10 1559) Pulse Rate: 55 (05/10 1559)  Labs:  Recent Labs  05/11/14 1057  HGB 14.0  HCT 41.4  PLT 199.0  LABPROT 12.7  INR 1.1*  CREATININE 1.01    Estimated Creatinine Clearance: 76.2 mL/min (by C-G formula based on Cr of 1.01).   Medical History: Past Medical History  Diagnosis Date  . Hypertension   . Elevated CPK     CPK elevated with normal MB and normal troponin the past  . Chest pain     Nuclear, 2006, no ischemia  . Atrial fibrillation     Paroxysmal, rare episodes, sinus rhythm on flecainide, patient prefers not to take Coumadin  . Arthritis     Right shoulder  . Ejection fraction     EF 55-60%,  echo, January, 2011  . Bradycardia     April, 2013    Medications:  Scheduled:  . aspirin EC  81 mg Oral Daily  . clidinium-chlordiazePOXIDE  1 capsule Oral TID AC  . flecainide  225 mg Oral Q12H  . metoprolol succinate  50 mg Oral Daily  . pravastatin  40 mg Oral q1800  . ramipril  5 mg Oral Daily  . tamsulosin  0.4 mg Oral Daily    Assessment: 69 yo M who had an abnormal outpt stress echo and was referred for cardiac cath.  Pt is now s/p radial cardiac cath 5/10 demonstrating RCA stenosis.  To begin heparin overnight with plans for rotational arthrectomy in AM.  Per RN, sheath out at 1515, anticipate radial band off ~ 1730.    Of note, pt received 4500 units of heparin during the procedure.  PMH: Afib (not on AC), HLD, HTN, DM  Goal of Therapy:  Heparin level 0.3-0.7 units/ml Monitor platelets by anticoagulation protocol: Yes   Plan:   Heparin infusion at 1200 units/hr - to start at 0130 5/11. Heparin level scheduled for 8 hours after gtt started (0930 5/11) Heparin level and CBC daily while on heparin.  [employer], Pharm.D., BCPS Clinical Pharmacist Pager [phone] 05/12/2014 4:45 PM

## 2014-05-12 NOTE — H&P (View-Only) (Signed)
[name]  04/22/2014 1:45 PM  Office Visit  MRN:  [id number]   Description: Male DOB: 02/21/1945  Provider: [name], MD  Department: [facility] [address] [facility]       Vital Signs  Most recent update: 04/22/2014 1:42 PM by Clearence Ped Pfohl    BP Pulse Ht Wt BMI    160/80 mmHg 49 5\' 8"  (1.727 m) 200 lb (90.719 kg) 30.42 kg/m2      Progress Notes      [name], MD at 04/22/2014 2:20 PM     Status: Signed       Expand All Collapse All      Cardiology Office Note   Date: 04/22/2014   ID: [name], DOB 1945/08/10, MRN [id number]  PCP: Leandrew Koyanagi, MD Cardiologist: Dola Argyle, MD   Chief Complaint  Patient presents with  . Appointment    Follow-up paroxysmal atrial fibrillation     History of Present Illness: [name] is a 69 y.o. male who presents to follow-up a history of atrial fibrillation in the past. He has had no palpitations. He does mention some vague right chest pain. He did have it after working in his yard for a long period of time. It is resolved. He is not having any shortness of breath.  There are several aspects of his care that I document at the time of each visit. He does have atrial fib risk factors. However he has been adamant over time that he does not want to use an anticoagulant unless he has to. Clinically he has had no return of his atrial fibrillation for several years. Of course this does not rule out the possibility that he is having atrial fib. In addition his flecainide dose is high. I have repeatedly documented that his level is in a good range.    Past Medical History  Diagnosis Date  . Hypertension   . Elevated CPK     CPK elevated with normal MB and normal troponin the past  . Chest pain     Nuclear, 2006, no ischemia  . Atrial fibrillation     Paroxysmal, rare episodes, sinus rhythm on flecainide, patient prefers not to take Coumadin  . Arthritis      Right shoulder  . Ejection fraction     EF 55-60%, echo, January, 2011  . Bradycardia     April, 2013    History reviewed. No pertinent past surgical history.  Patient Active Problem List   Diagnosis Date Noted  . Dyslipidemia 04/21/2013  . NASH (nonalcoholic steatohepatitis) 09/06/2011  . Rotator cuff tendinitis 07/28/2011  . Bradycardia   . Elevated CPK   . Chest pain   . Hypertension   . Ejection fraction   . BPH (benign prostatic hyperplasia) 03/11/2011  . IBS (irritable bowel syndrome) 03/11/2011  . Overweight 03/11/2011  . Bursitis of hip 03/11/2011  . DM type 2 (diabetes mellitus, type 2) 01/21/2009  . ATRIAL FIBRILLATION, PAROXYSMAL 01/21/2009      Current Outpatient Prescriptions  Medication Sig Dispense Refill  . aspirin 81 MG tablet Take 81 mg by mouth daily.    . clidinium-chlordiazePOXIDE (LIBRAX) 5-2.5 MG per capsule Take 1 capsule by mouth 3 (three) times daily before meals. 270 capsule 0  . diphenoxylate-atropine (LOMOTIL) 2.5-0.025 MG per tablet take 1 tablet by mouth four times a day if needed 180 tablet 0  . flecainide (TAMBOCOR) 150 MG tablet TAKE ONE (1) AND 1/2 TABLETS TWICE DAILY 90 tablet  5  . fluticasone (FLONASE) 50 MCG/ACT nasal spray Place 1 spray into both nostrils as needed. 16 g 1  . glyBURIDE-metformin (GLUCOVANCE) 5-500 MG per tablet Take 1 tablet by mouth daily with breakfast. (Patient taking differently: Take 1 tablet by mouth 2 (two) times daily with a meal. Take 1/2 tablet six times daily) 180 tablet 0  . metoprolol succinate (TOPROL-XL) 50 MG 24 hr tablet Take 1 tablet (50 mg total) by mouth daily. 30 tablet 5  . pravastatin (PRAVACHOL) 20 MG tablet Take 1 tablet (20 mg total) by mouth every evening. 90 tablet 0  . ramipril (ALTACE) 5 MG capsule TAKE 1 CAPSULE DAILY. 90 capsule 3  . tamsulosin (FLOMAX) 0.4 MG CAPS  capsule Take 1 capsule (0.4 mg total) by mouth daily. 90 capsule 0   No current facility-administered medications for this visit.    Allergies: Lipitor    Social History: The patient  reports that he has never smoked. He does not have any smokeless tobacco history on file.   Family History: The patient's family history includes Alzheimer's disease in his mother; Cancer in his brother; Emphysema in his father.    ROS: Please see the history of present illness. Patient denies fever, chills, headache, sweats, rash, change in vision, change in hearing, cough, nausea or vomiting, urinary symptoms. All other systems are reviewed and are negative.   PHYSICAL EXAM: VS: BP 160/80 mmHg  Pulse 49  Ht 5\' 8"  (1.727 m)  Wt 200 lb (90.719 kg)  BMI 30.42 kg/m2 , Patient is oriented to person time and place. Affect is normal. He reports that his blood pressure at home and at work are always in the range of 712 systolic. He notes that his primary physician also feels that he has white coat syndrome. He is oriented to person time and place. Affect is normal. Head is atraumatic. Sclera and conjunctiva are normal. There is no jugular venous distention. Lungs are clear. Respiratory effort is nonlabored. Cardiac exam reveals S1 and S2. Abdomen is soft. There is no peripheral edema. There are no musculoskeletal deformities. There are no skin rashes. Neurologic is grossly intact.  EKG: EKG reveals sinus bradycardia. There are nonspecific ST-T wave changes. There is no change from the past.   Recent Labs: 02/18/2014: ALT 61*; BUN 13; Creatinine 0.91; Hemoglobin 15.1; Platelets 225; Potassium 4.4; Sodium 136    Lipid Panel  Labs (Brief)       Component Value Date/Time   CHOL 168 02/18/2014 1519   TRIG 187* 02/18/2014 1519   HDL 38* 02/18/2014 1519   CHOLHDL 4.4 02/18/2014 1519   VLDL 37 02/18/2014 1519   LDLCALC 93 02/18/2014 1519       Wt Readings  from Last 3 Encounters:  04/22/14 200 lb (90.719 kg)  02/18/14 201 lb (91.173 kg)  11/03/13 197 lb (89.359 kg)      Current medicines are reviewed The patient understands his medications.     ASSESSMENT AND PLAN:              ATRIAL FIBRILLATION, PAROXYSMAL - [name], MD at 04/22/2014 5:47 PM     Status: Alison Stalling Related Problem: ATRIAL FIBRILLATION, PAROXYSMAL   Expand All Collapse All   He has not had a documented episode of atrial fib for many many years. He is on high-dose flecainide. Level will be checked. At this point he is not on an anticoagulant. I have documented repeatedly over the years that he and I have discussed this  issue. He does have risk factors that warrant anticoagulation. 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 Now he tells me that he is concerned about anticoagulants in general. He has held sinus rhythm for many years. This does not change his risk. I told him that the issue of anticoagulation will be reviewed again by the new physician that he sees on our team after I retire.         Revision History       Date/Time User Action    > 04/22/2014 5:52 PM [name], MD Edit     04/22/2014 5:47 PM [name], MD Create              Chest pain - [name], MD at 04/22/2014 5:48 PM     Status: Written Related Problem: Chest pain   Expand All Collapse All   He has had some right anterior chest discomfort. I'm not convinced that it represents angina. However we need to rule this out. We also need more information about his LV function. We will proceed with a stress echo.            Hyperlipidemia - [name], MD at 04/22/2014 5:50 PM     Status: Written Related Problem: Hyperlipidemia   Expand All Collapse All   In the past he has had significant symptoms from Lipitor. I've been able to convince him to take a small dose of Pravachol.            Hypertension -  [name], MD at 04/22/2014 5:53 PM     Status: Written Related Problem: Hypertension   Expand All Collapse All   Blood pressure in the office today is mildly elevated. He insists that his pressure at home was in the range of 120/75 on a regular basis. No change in therapy.             Referring Provider     Leandrew Koyanagi, MD     Diagnoses     Paroxysmal atrial fibrillation - Primary    ICD-9-CM: 427.31 ICD-10-CM: I48.0    Long term use of drug     ICD-9-CM: V58.69 ICD-10-CM: Z79.899    Chest pain, unspecified chest pain type     ICD-9-CM: 786.50 ICD-10-CM: R07.9    Hyperlipidemia     ICD-9-CM: 272.4 ICD-10-CM: E78.5    Essential hypertension     ICD-9-CM: 401.9 ICD-10-CM: I10       Reason for Visit     Appointment    Follow-up paroxysmal atrial fibrillation    Reason for Visit History        Discontinued Medications       Reason for Discontinue    BYDUREON 2 MG SUSR Cost of medication    glyBURIDE-metformin (GLUCOVANCE) 1.25-250 MG per tablet Change in therapy      Level of Service     PR OFFICE OUTPATIENT VISIT 15 MINUTES [[id number]]      Follow-up and Disposition     Return in about 1 year (around 04/22/2015).       All Charges for This Encounter     Code Description Service Date Service Provider Modifiers Qty    [place], COMPLETE 04/22/2014 [name], MD  [place] OFFICE OUTPATIENT VISIT 15 MINUTES 04/22/2014 [name], MD  1    [phone] PR CURRENT TOBACCO NON-USER 04/22/2014 [name], MD  1      AVS Reports     Date/Time Report Action User    04/22/2014 3:17 PM After Visit Summary Printed Deliah Boston Via, LPN    6/44/0347 4:25 PM After Visit Summary Printed Deliah Boston Via, LPN      Patient Instructions     Medication Instructions:  None  Labwork: Tomorrow between 7:30 and 5 (Please do not take your Flecainide dose in the  morning until your lab work has been done)  Testing/Procedures: Your physician has requested that you have a stress echocardiogram. For further information please visit [email or web address]. Please follow instruction sheet as given.   Follow-Up: Your physician wants you to follow-up in: 1 year. You will receive a reminder letter in the mail two months in advance. If you don't receive a letter, please call our office to schedule the follow-up appointment.            Routing History     There are no sent or routed communications associated with this encounter.     Previous Visit       Provider Department Encounter #    04/01/2014 5:46 AM Dola Argyle, MD [place] [id number]      The stress echo is abnormal.  We need to proceed with cath.  This will be done 05/12/2014 AM.  Daryel November, MD

## 2014-05-12 NOTE — Interval H&P Note (Signed)
History and Physical Interval Note:  05/12/2014 Cath Lab Visit (complete for each Cath Lab visit)  Clinical Evaluation Leading to the Procedure:   ACS: No.  Non-ACS:    Anginal Classification: CCS III  Anti-ischemic medical therapy: Minimal Therapy (1 class of medications)  Non-Invasive Test Results: Intermediate-risk stress test findings: cardiac mortality 1-3%/year  Prior CABG: No previous CABG       2:21 PM  [name]  has presented today for surgery, with the diagnosis of abnormal stress echo  The various methods of treatment have been discussed with the patient and family. After consideration of risks, benefits and other options for treatment, the patient has consented to  Procedure(s): Left Heart Cath and Coronary Angiography (N/A) as a surgical intervention .  The patient's history has been reviewed, patient examined, no change in status, stable for surgery.  I have reviewed the patient's chart and labs.  Questions were answered to the patient's satisfaction.     KELLY,THOMAS A

## 2014-05-12 NOTE — Progress Notes (Signed)
TR BAND REMOVAL  LOCATION:  right radial  DEFLATED PER PROTOCOL:  Yes.    TIME BAND OFF / DRESSING APPLIED:   1800   SITE UPON ARRIVAL:   Level 0  SITE AFTER BAND REMOVAL:  Level 0  REVERSE ALLEN'S TEST:    positive  CIRCULATION SENSATION AND MOVEMENT:  Within Normal Limits  Yes.    COMMENTS:

## 2014-05-13 ENCOUNTER — Encounter (HOSPITAL_COMMUNITY): Payer: Self-pay | Admitting: Cardiovascular Disease

## 2014-05-13 ENCOUNTER — Encounter (HOSPITAL_COMMUNITY)
Admission: RE | Disposition: A | Discharge status: Home / Self Care | Payer: No Typology Code available for payment source | Source: Ambulatory Visit | Attending: Cardiology

## 2014-05-13 DIAGNOSIS — I25810 Atherosclerosis of coronary artery bypass graft(s) without angina pectoris: Secondary | ICD-10-CM | POA: Insufficient documentation

## 2014-05-13 DIAGNOSIS — R001 Bradycardia, unspecified: Secondary | ICD-10-CM | POA: Insufficient documentation

## 2014-05-13 HISTORY — PX: CARDIAC CATHETERIZATION: SHX172

## 2014-05-13 LAB — GLUCOSE, CAPILLARY
Glucose-Capillary: 214 mg/dL — ABNORMAL HIGH (ref 70–99)
Glucose-Capillary: 170 mg/dL — ABNORMAL HIGH (ref 70–99)

## 2014-05-13 LAB — BASIC METABOLIC PANEL
Anion gap: 10 (ref 5–15)
BUN: 9 mg/dL (ref 6–20)
CHLORIDE: 103 mmol/L (ref 101–111)
CO2: 25 mmol/L (ref 22–32)
CREATININE: 1.04 mg/dL (ref 0.61–1.24)
Calcium: 9 mg/dL (ref 8.9–10.3)
GFR calc Af Amer: >60 mL/min (ref >60)
GFR calc non Af Amer: >60 mL/min (ref >60)
Glucose, Bld: 236 mg/dL — ABNORMAL HIGH (ref 70–99)
Potassium: 4.4 mmol/L (ref 3.5–5.1)
Sodium: 138 mmol/L (ref 135–145)

## 2014-05-13 LAB — MRSA PCR SCREENING: MRSA by PCR: NEGATIVE

## 2014-05-13 LAB — CBC
HCT: 41.3 % (ref 39.0–52.0)
HEMOGLOBIN: 13.5 g/dL (ref 13.0–17.0)
MCH: 29.5 pg (ref 26.0–34.0)
MCHC: 32.7 g/dL (ref 30.0–36.0)
MCV: 90.4 fL (ref 78.0–100.0)
PLATELETS: 172 10*3/uL (ref 150–400)
RBC: 4.57 MIL/uL (ref 4.22–5.81)
RDW: 13.2 % (ref 11.5–15.5)
WBC: 5.6 10*3/uL (ref 4.0–10.5)

## 2014-05-13 LAB — HEPARIN LEVEL (UNFRACTIONATED): HEPARIN UNFRACTIONATED: 0.12 [IU]/mL — AB (ref 0.30–0.70)

## 2014-05-13 LAB — POCT ACTIVATED CLOTTING TIME: Activated Clotting Time: 515 seconds

## 2014-05-13 SURGERY — CORONARY ATHERECTOMY
Laterality: Right

## 2014-05-13 MED ORDER — BIVALIRUDIN BOLUS VIA INFUSION - CUPID
INTRAVENOUS | Status: DC | PRN
  Administered 2014-05-13: 66 mg via INTRAVENOUS

## 2014-05-13 MED ORDER — ASPIRIN 81 MG PO CHEW
81.0000 mg | CHEWABLE_TABLET | ORAL | Status: DC
  Filled 2014-05-13: qty 1

## 2014-05-13 MED ORDER — MIDAZOLAM HCL 2 MG/2ML IJ SOLN
INTRAMUSCULAR | Status: AC
  Filled 2014-05-13: qty 2

## 2014-05-13 MED ORDER — SODIUM CHLORIDE 0.9 % IJ SOLN: 3.0000 mL | Freq: Two times a day (BID) | INTRAMUSCULAR | Status: DC

## 2014-05-13 MED ORDER — SODIUM CHLORIDE 0.9 % WEIGHT BASED INFUSION
1.0000 mL/kg/h | INTRAVENOUS | Status: DC
  Administered 2014-05-13: 1 mL/kg/h via INTRAVENOUS

## 2014-05-13 MED ORDER — GLYBURIDE 5 MG PO TABS
5.0000 mg | ORAL_TABLET | Freq: Every day | ORAL | Status: DC
  Administered 2014-05-14: 5 mg via ORAL
  Filled 2014-05-13 (×2): qty 1

## 2014-05-13 MED ORDER — TICAGRELOR 90 MG PO TABS
90.0000 mg | ORAL_TABLET | Freq: Two times a day (BID) | ORAL | Status: DC
  Administered 2014-05-13 – 2014-05-14 (×2): 90 mg via ORAL
  Filled 2014-05-13 (×3): qty 1

## 2014-05-13 MED ORDER — ATROPINE SULFATE 0.1 MG/ML IJ SOLN
INTRAMUSCULAR | Status: AC
  Filled 2014-05-13: qty 10

## 2014-05-13 MED ORDER — NITROGLYCERIN 1 MG/10 ML FOR IR/CATH LAB
INTRA_ARTERIAL | Status: AC
Start: 2014-05-13 — End: 2014-05-13
  Filled 2014-05-13: qty 10

## 2014-05-13 MED ORDER — HEPARIN (PORCINE) IN NACL 2-0.9 UNIT/ML-% IJ SOLN
INTRAMUSCULAR | Status: AC
  Filled 2014-05-13: qty 1500

## 2014-05-13 MED ORDER — SODIUM CHLORIDE 0.9 % IJ SOLN: 3.0000 mL | INTRAMUSCULAR | Status: DC | PRN

## 2014-05-13 MED ORDER — HEPARIN (PORCINE) IN NACL 100-0.45 UNIT/ML-% IJ SOLN
1500.0000 [IU]/h | INTRAMUSCULAR | Status: DC
  Administered 2014-05-13: 10:00:00 1500 [IU]/h via INTRAVENOUS
  Filled 2014-05-13 (×2): qty 250

## 2014-05-13 MED ORDER — VERAPAMIL HCL 2.5 MG/ML IV SOLN
INTRAVENOUS | Status: AC
  Filled 2014-05-13: qty 4

## 2014-05-13 MED ORDER — ASPIRIN 81 MG PO CHEW
81.0000 mg | CHEWABLE_TABLET | ORAL | Status: AC
  Administered 2014-05-13: 11:00:00 81 mg via ORAL

## 2014-05-13 MED ORDER — SODIUM CHLORIDE 0.9 % IJ SOLN
3.0000 mL | Freq: Two times a day (BID) | INTRAMUSCULAR | Status: DC
  Administered 2014-05-13 – 2014-05-14 (×3): 3 mL via INTRAVENOUS

## 2014-05-13 MED ORDER — BIVALIRUDIN 250 MG IV SOLR
INTRAVENOUS | Status: AC
  Filled 2014-05-13: qty 250

## 2014-05-13 MED ORDER — MIDAZOLAM HCL 2 MG/2ML IJ SOLN
INTRAMUSCULAR | Status: DC | PRN
Start: 2014-05-13 — End: 2014-05-13
  Administered 2014-05-13: 1 mg via INTRAVENOUS
  Administered 2014-05-13: 2 mg via INTRAVENOUS

## 2014-05-13 MED ORDER — LIDOCAINE HCL (PF) 1 % IJ SOLN
INTRAMUSCULAR | Status: AC
  Filled 2014-05-13: qty 30

## 2014-05-13 MED ORDER — TICAGRELOR 90 MG PO TABS
180.0000 mg | ORAL_TABLET | Freq: Once | ORAL | Status: AC
  Administered 2014-05-13: 09:00:00 180 mg via ORAL
  Filled 2014-05-13: qty 2

## 2014-05-13 MED ORDER — SODIUM CHLORIDE 0.9 % IV SOLN
250.0000 mg | INTRAVENOUS | Status: DC | PRN
  Administered 2014-05-13: 1.75 mg/kg/h via INTRAVENOUS

## 2014-05-13 MED ORDER — INSULIN ASPART 100 UNIT/ML ~~LOC~~ SOLN
0.0000 [IU] | Freq: Three times a day (TID) | SUBCUTANEOUS | Status: DC
  Administered 2014-05-13: 2 [IU] via SUBCUTANEOUS
  Administered 2014-05-14: 3 [IU] via SUBCUTANEOUS
  Administered 2014-05-14: 2 [IU] via SUBCUTANEOUS

## 2014-05-13 MED ORDER — ASPIRIN EC 81 MG PO TBEC
81.0000 mg | DELAYED_RELEASE_TABLET | Freq: Every day | ORAL | Status: DC
  Administered 2014-05-14: 81 mg via ORAL
  Filled 2014-05-13: qty 1

## 2014-05-13 MED ORDER — NITROGLYCERIN 1 MG/10 ML FOR IR/CATH LAB
INTRA_ARTERIAL | Status: DC | PRN
  Administered 2014-05-13: 200 ug via INTRA_ARTERIAL
  Administered 2014-05-13: 100 ug via INTRA_ARTERIAL
  Administered 2014-05-13 (×2): 200 ug via INTRA_ARTERIAL

## 2014-05-13 MED ORDER — IOHEXOL 350 MG/ML SOLN
INTRAVENOUS | Status: DC | PRN
  Administered 2014-05-13: 210 mL via INTRAVENOUS

## 2014-05-13 MED ORDER — SODIUM CHLORIDE 0.9 % IV SOLN: 250.0000 mL | INTRAVENOUS | Status: DC | PRN

## 2014-05-13 MED ORDER — SODIUM CHLORIDE 0.9 % IV SOLN
INTRAVENOUS | Status: DC
  Administered 2014-05-13 – 2014-05-14 (×2): via INTRAVENOUS

## 2014-05-13 MED ORDER — FENTANYL CITRATE (PF) 100 MCG/2ML IJ SOLN
INTRAMUSCULAR | Status: AC
  Filled 2014-05-13: qty 2

## 2014-05-13 MED ORDER — SODIUM CHLORIDE 0.9 % IV SOLN
250.0000 mL | INTRAVENOUS | Status: DC | PRN
  Administered 2014-05-13: 10 mL/h via INTRAVENOUS
  Administered 2014-05-13: 250 mL via INTRAVENOUS

## 2014-05-13 MED ORDER — LIVING BETTER WITH HEART FAILURE BOOK
Freq: Once | Status: AC
  Administered 2014-05-13: 10:00:00

## 2014-05-13 MED ORDER — FENTANYL CITRATE (PF) 100 MCG/2ML IJ SOLN
INTRAMUSCULAR | Status: DC | PRN
  Administered 2014-05-13: 50 ug via INTRAVENOUS
  Administered 2014-05-13: 25 ug via INTRAVENOUS

## 2014-05-13 MED ORDER — SODIUM CHLORIDE 0.9 % WEIGHT BASED INFUSION
3.0000 mL/kg/h | INTRAVENOUS | Status: AC
  Administered 2014-05-13: 09:00:00 3 mL/kg/h via INTRAVENOUS

## 2014-05-13 MED ORDER — ACETAMINOPHEN 325 MG PO TABS: 650.0000 mg | ORAL_TABLET | ORAL | Status: DC | PRN

## 2014-05-13 MED ORDER — ATROPINE SULFATE 0.1 MG/ML IJ SOLN
INTRAMUSCULAR | Status: AC
Start: 2014-05-13 — End: 2014-05-14
  Filled 2014-05-13: qty 10

## 2014-05-13 MED ORDER — ONDANSETRON HCL 4 MG/2ML IJ SOLN
4.0000 mg | Freq: Four times a day (QID) | INTRAMUSCULAR | Status: DC | PRN
  Administered 2014-05-13: 4 mg via INTRAVENOUS
  Filled 2014-05-13: qty 2

## 2014-05-13 MED ORDER — HEPARIN BOLUS VIA INFUSION
2500.0000 [IU] | Freq: Once | INTRAVENOUS | Status: AC
  Administered 2014-05-13: 10:00:00 2500 [IU] via INTRAVENOUS
  Filled 2014-05-13: qty 2500

## 2014-05-13 MED ORDER — HEPARIN SODIUM (PORCINE) 1000 UNIT/ML IJ SOLN
INTRAMUSCULAR | Status: AC
  Filled 2014-05-13: qty 1

## 2014-05-13 MED FILL — Heparin Sodium (Porcine) 2 Unit/ML in Sodium Chloride 0.9%: INTRAMUSCULAR | Qty: 1000 | Status: AC

## 2014-05-13 MED FILL — Lidocaine HCl Local Preservative Free (PF) Inj 1%: INTRAMUSCULAR | Qty: 30 | Status: AC

## 2014-05-13 SURGICAL SUPPLY — 26 items
BALLN MINITREK OTW 1.20X6 (BALLOONS) ×4
BALLN MINITREK OTW 2.0X20 (BALLOONS) ×4
BALLN ~~LOC~~ EUPHORA RX 2.5X15 (BALLOONS) ×4
BALLN ~~LOC~~ EUPHORA RX 2.75X27 (BALLOONS) ×4
BALLOON MINITREK OTW 1.20X6 (BALLOONS) ×2 IMPLANT
BALLOON MINITREK OTW 2.0X20 (BALLOONS) ×2 IMPLANT
BALLOON ~~LOC~~ EUPHORA RX 2.5X15 (BALLOONS) ×2 IMPLANT
BALLOON ~~LOC~~ EUPHORA RX 2.75X27 (BALLOONS) ×2 IMPLANT
BURR ROTALINK 1.50MM (BURR) ×4 IMPLANT
CATH ROTALINK PLUS 1.25MM (BURR) ×4 IMPLANT
CATH S G BIP PACING (SET/KITS/TRAYS/PACK) ×4 IMPLANT
GUIDE CATH MACH 1 7F FR4 SH (CATHETERS) ×4 IMPLANT
KIT ENCORE 26 ADVANTAGE (KITS) ×4 IMPLANT
KIT HEART LEFT (KITS) ×4 IMPLANT
LUBRICANT ROTAGLIDE 20CC VIAL (MISCELLANEOUS) ×4 IMPLANT
PACK CARDIAC CATHETERIZATION (CUSTOM PROCEDURE TRAY) ×4 IMPLANT
SHEATH PINNACLE 6F 10CM (SHEATH) ×4 IMPLANT
SHEATH PINNACLE 7F 10CM (SHEATH) ×4 IMPLANT
SLEEVE REPOSITIONING LENGTH 30 (MISCELLANEOUS) ×4 IMPLANT
STENT XIENCE ALPINE RX 2.25X28 (Permanent Stent) ×4 IMPLANT
STENT XIENCE ALPINE RX 2.5X33 (Permanent Stent) ×4 IMPLANT
TRANSDUCER W/STOPCOCK (MISCELLANEOUS) ×4 IMPLANT
TUBING CIL FLEX 10 FLL-RA (TUBING) ×4 IMPLANT
WIRE ASAHI PROWATER 300CM (WIRE) ×4 IMPLANT
WIRE EMERALD 3MM-J .035X150CM (WIRE) ×4 IMPLANT
WIRE EXTRA SUPPORT .009X325CM (WIRE) ×4 IMPLANT

## 2014-05-13 NOTE — Progress Notes (Signed)
Patient: [name] / Admit Date: 05/12/2014 / Date of Encounter: 05/13/2014, 7:21 AM   Subjective: No complaints. No CP or SOB.    Objective: Telemetry: NSR/SB (HR mostly 50s) Physical Exam: Blood pressure 120/51, pulse 51, temperature 98.1 F (36.7 C), temperature source Oral, resp. rate 20, height 5' 8"  (1.727 m), weight 194 lb 0.1 oz (88 kg), SpO2 97 %. General: Well developed, well nourished WM, in no acute distress. Head: Normocephalic, atraumatic, sclera non-icteric, no xanthomas, nares are without discharge. Neck: Negative for carotid bruits. JVP not elevated. Lungs: Clear bilaterally to auscultation without wheezes, rales, or rhonchi. Breathing is unlabored. Heart: RRR S1 S2 without murmurs, rubs, or gallops.  Abdomen: Soft, non-tender, non-distended with normoactive bowel sounds. No rebound/guarding. Extremities: No clubbing or cyanosis. No edema. Distal pedal pulses are 2+ and equal bilaterally. Right radial site without ecchymosis, hematoma; good pulse. Neuro: Alert and oriented X 3. Moves all extremities spontaneously. Psych:  Responds to questions appropriately with a normal affect.   Intake/Output Summary (Last 24 hours) at 05/13/14 0721 Last data filed at 05/13/14 0000  Gross per 24 hour  Intake 1391.25 ml  Output    850 ml  Net 541.25 ml    Inpatient Medications:  . aspirin EC  81 mg Oral Daily  . clidinium-chlordiazePOXIDE  1 capsule Oral TID AC  . insulin aspart  0-9 Units Subcutaneous TID WC  . metoprolol succinate  50 mg Oral Daily  . pravastatin  40 mg Oral q1800  . ramipril  5 mg Oral Daily  . tamsulosin  0.4 mg Oral Daily   Infusions:  . heparin 1,200 Units/hr (05/13/14 0136)    Labs:  Recent Labs  05/11/14 1057  NA 135  K 4.3  CL 102  CO2 28  GLUCOSE 181*  BUN 14  CREATININE 1.01  CALCIUM 9.6    Recent Labs  05/11/14 1057 05/13/14 0314  WBC 8.2 5.6  NEUTROABS 6.1  --   HGB 14.0 13.5  HCT 41.4 41.3  MCV 89.6 90.4  PLT  199.0 172   Radiology/Studies:  No results found.   Assessment and Plan  1. Unstable angina/CAD - recent progression of CP with abnormal stress echo. Cardiac cath 05/12/14 with large dominant RCA with 90% prox and 95% mid stenosis, otherwise mild-moderate disease (70% smooth ostial LAD, 50% mLAD, 70% mD1, 25% Cx, 40% distal RCA). He is for high-speed rotational atherectomy today with plan for temporary pacemaker backup during the procedure. Continue ASA, BB, statin. On heparin per pharmacy. Choice of additional antiplatelet agent per Dr. Claiborne Billings. 2. Cardiomyopathy, possibly ischemic - EF 40% by cath - appears euvolemic. Daily weights, salt restriction, monitoring for CHF sx discussed. Will order Living Better with HF book. 3. History of paroxysmal atrial fibrillation - prior to admission, the patient was on high dose flecainide. I have discontinued this because it is contraindicated given his CAD and cardiomyopathy. Traditionally the patient has declined anticoagulation - he continues to uphold this decision. He has not had documented AF for several years ("16" per patient). CHADSVASC = 5 given LV dysfunction, hypertension, age, diabetes, and now CAD. May be reasonable to observe for recurrent AF at which time we will have to revisit the discussion of anticoagulation. For now he declines but understands he will require a different type of blood thinner for #1. 4. Hypertension - labile BPs but normotensive this AM. Consider titration of ACEI post-cath. 5. Hyperlipidemia - LDL 93 on panel 02/2014. Maintained on pravastatin (intolerant of  Lipitor). Check lipids/LFTs in AM. He has been on Crestor before and did OK. 6. Diabetes mellitus - home glyburide/metformin on hold. Resume glyburide tomorrow AM once no longer NPO, but continue to hold metformin peri-cath. Add SSI.  Signed, Melina Copa PA-C Pager: [phone]   Agree with above.  Seen by Dr. Claiborne Billings in cath lab.   Candee Furbish, MD

## 2014-05-13 NOTE — H&P (View-Only) (Signed)
ANTICOAGULATION CONSULT NOTE - Follow Up Consult  Pharmacy Consult for Heparin Indication: RCA stenosis  Allergies  Allergen Reactions  . Lipitor [Atorvastatin]     Myopathy Spring 2006    Patient Measurements: Height: 5\' 8"  (172.7 cm) Weight: 194 lb 0.1 oz (88 kg) IBW/kg (Calculated) : 68.4 Heparin Dosing Weight: 86kg  Vital Signs: Temp: 98 F (36.7 C) (05/11 0755) Temp Source: Oral (05/11 0755) BP: 150/73 mmHg (05/11 0755) Pulse Rate: 57 (05/11 0755)  Labs:  Recent Labs  05/11/14 1057 05/13/14 0314 05/13/14 0600 05/13/14 0915  HGB 14.0 13.5  --   --   HCT 41.4 41.3  --   --   PLT 199.0 172  --   --   LABPROT 12.7  --   --   --   INR 1.1*  --   --   --   HEPARINUNFRC  --   --   --  0.12*  CREATININE 1.01  --  1.04  --     Estimated Creatinine Clearance: 73.3 mL/min (by C-G formula based on Cr of 1.04).   Medications:  Heparin @ 1200 units/hr  Assessment: 68yom s/p cath 5/10 found to have RCA stenosis. He was started on heparin with plans for rotational arthrectomy today. Initial heparin level is below goal. No issues with infusion. CBC stable. No bleeding reported.  Goal of Therapy:  Heparin level 0.3-0.7 units/ml Monitor platelets by anticoagulation protocol: Yes   Plan:  1) Re-bolus heparin 2500 units x 1 2) Increase heparin drip to 1500 units/hr 3) Follow up after procedure  Deboraha Sprang 05/13/2014,10:04 AM

## 2014-05-13 NOTE — Interval H&P Note (Signed)
History and Physical Interval Note:  05/13/2014 4:51 PM  [name]  has presented today for surgery, with the diagnosis of CAD  The various methods of treatment have been discussed with the patient and family. After consideration of risks, benefits and other options for treatment, the patient has consented to  Procedure(s): Coronary/Graft Atherectomy (N/A) as a surgical intervention .  The patient's history has been reviewed, patient examined, no change in status, stable for surgery.  I have reviewed the patient's chart and labs.  Questions were answered to the patient's satisfaction.     KELLY,THOMAS A

## 2014-05-13 NOTE — Progress Notes (Signed)
ANTICOAGULATION CONSULT NOTE - Follow Up Consult  Pharmacy Consult for Heparin Indication: RCA stenosis  Allergies  Allergen Reactions  . Lipitor [Atorvastatin]     Myopathy Spring 2006    Patient Measurements: Height: 5\' 8"  (172.7 cm) Weight: 194 lb 0.1 oz (88 kg) IBW/kg (Calculated) : 68.4 Heparin Dosing Weight: 86kg  Vital Signs: Temp: 98 F (36.7 C) (05/11 0755) Temp Source: Oral (05/11 0755) BP: 150/73 mmHg (05/11 0755) Pulse Rate: 57 (05/11 0755)  Labs:  Recent Labs  05/11/14 1057 05/13/14 0314 05/13/14 0600 05/13/14 0915  HGB 14.0 13.5  --   --   HCT 41.4 41.3  --   --   PLT 199.0 172  --   --   LABPROT 12.7  --   --   --   INR 1.1*  --   --   --   HEPARINUNFRC  --   --   --  0.12*  CREATININE 1.01  --  1.04  --     Estimated Creatinine Clearance: 73.3 mL/min (by C-G formula based on Cr of 1.04).   Medications:  Heparin @ 1200 units/hr  Assessment: 68yom s/p cath 5/10 found to have RCA stenosis. He was started on heparin with plans for rotational arthrectomy today. Initial heparin level is below goal. No issues with infusion. CBC stable. No bleeding reported.  Goal of Therapy:  Heparin level 0.3-0.7 units/ml Monitor platelets by anticoagulation protocol: Yes   Plan:  1) Re-bolus heparin 2500 units x 1 2) Increase heparin drip to 1500 units/hr 3) Follow up after procedure  Deboraha Sprang 05/13/2014,10:04 AM

## 2014-05-14 ENCOUNTER — Encounter (HOSPITAL_COMMUNITY): Payer: Self-pay | Admitting: Cardiovascular Disease

## 2014-05-14 DIAGNOSIS — R9439 Abnormal result of other cardiovascular function study: Secondary | ICD-10-CM

## 2014-05-14 DIAGNOSIS — I10 Essential (primary) hypertension: Secondary | ICD-10-CM

## 2014-05-14 DIAGNOSIS — I2510 Atherosclerotic heart disease of native coronary artery without angina pectoris: Secondary | ICD-10-CM

## 2014-05-14 DIAGNOSIS — I480 Paroxysmal atrial fibrillation: Secondary | ICD-10-CM

## 2014-05-14 LAB — COMPREHENSIVE METABOLIC PANEL
ALK PHOS: 46 U/L (ref 38–126)
ALT: 32 U/L (ref 17–63)
AST: 18 U/L (ref 15–41)
Albumin: 3 g/dL — ABNORMAL LOW (ref 3.5–5.0)
Anion gap: 7 (ref 5–15)
BILIRUBIN TOTAL: 1 mg/dL (ref 0.3–1.2)
BUN: 9 mg/dL (ref 6–20)
CO2: 24 mmol/L (ref 22–32)
Calcium: 8.5 mg/dL — ABNORMAL LOW (ref 8.9–10.3)
Chloride: 107 mmol/L (ref 101–111)
Creatinine, Ser: 1.04 mg/dL (ref 0.61–1.24)
GLUCOSE: 213 mg/dL — AB (ref 65–99)
Potassium: 4.3 mmol/L (ref 3.5–5.1)
Sodium: 138 mmol/L (ref 135–145)
Total Protein: 5.9 g/dL — ABNORMAL LOW (ref 6.5–8.1)

## 2014-05-14 LAB — CBC
HCT: 39.3 % (ref 39.0–52.0)
Hemoglobin: 13 g/dL (ref 13.0–17.0)
MCH: 30 pg (ref 26.0–34.0)
MCHC: 33.1 g/dL (ref 30.0–36.0)
MCV: 90.6 fL (ref 78.0–100.0)
Platelets: 166 10*3/uL (ref 150–400)
RBC: 4.34 MIL/uL (ref 4.22–5.81)
RDW: 13.3 % (ref 11.5–15.5)
WBC: 5.6 10*3/uL (ref 4.0–10.5)

## 2014-05-14 LAB — GLUCOSE, CAPILLARY
GLUCOSE-CAPILLARY: 218 mg/dL — AB (ref 65–99)
Glucose-Capillary: 151 mg/dL — ABNORMAL HIGH (ref 65–99)
Glucose-Capillary: 184 mg/dL — ABNORMAL HIGH (ref 65–99)

## 2014-05-14 LAB — LIPID PANEL
Cholesterol: 134 mg/dL (ref 0–200)
HDL: 29 mg/dL — ABNORMAL LOW (ref >40)
LDL Cholesterol: 84 mg/dL (ref 0–99)
TRIGLYCERIDES: 103 mg/dL (ref <150)
Total CHOL/HDL Ratio: 4.6 RATIO
VLDL: 21 mg/dL (ref 0–40)

## 2014-05-14 MED ORDER — PRAVASTATIN SODIUM 40 MG PO TABS: 40.0000 mg | ORAL_TABLET | Freq: Every day | ORAL | Status: DC

## 2014-05-14 MED ORDER — TICAGRELOR 90 MG PO TABS: 90.0000 mg | ORAL_TABLET | Freq: Two times a day (BID) | ORAL | Status: DC

## 2014-05-14 MED ORDER — NITROGLYCERIN 0.4 MG SL SUBL: 0.4000 mg | SUBLINGUAL_TABLET | SUBLINGUAL | Status: DC | PRN

## 2014-05-14 MED FILL — Lidocaine HCl Local Preservative Free (PF) Inj 1%: INTRAMUSCULAR | Qty: 30 | Status: AC

## 2014-05-14 MED FILL — Heparin Sodium (Porcine) 2 Unit/ML in Sodium Chloride 0.9%: INTRAMUSCULAR | Qty: 1500 | Status: AC

## 2014-05-14 NOTE — Discharge Instructions (Signed)
No driving for 24 hours. No lifting over 5 lbs for 1 week. No sexual activity for 1 week. Keep procedure site clean & dry. If you notice increased pain, swelling, bleeding or pus, call/return!  You may shower, but no soaking baths/hot tubs/pools for 1 week.   Please restart Altace (Ramipril) tomorrow on 05/15/2014

## 2014-05-14 NOTE — Progress Notes (Signed)
Right femoral arterial sheath removed without any complication. Patient educated about the risk of bleeding and need to call RN. Will continue to monitor

## 2014-05-14 NOTE — Progress Notes (Signed)
Subjective: No complaints today. No bleeding from femoral site. Feels fine, wants to go home today.  Objective: Vital signs in last 24 hours: Filed Vitals:   05/14/14 0700 05/14/14 0758 05/14/14 0800 05/14/14 0900  BP: 145/77  157/68 133/63  Pulse: 60 56 55 77  Temp:  97.9 F (36.6 C)    TempSrc:  Oral    Resp: 21 21 16 15   Height:      Weight:      SpO2: 98% 100% 98% 99%   Weight change: -2 lb 0.2 oz (-0.912 kg)  Intake/Output Summary (Last 24 hours) at 05/14/14 0940 Last data filed at 05/14/14 0900  Gross per 24 hour  Intake 1749.16 ml  Output   2160 ml  Net -410.84 ml   General appearance: alert, cooperative and no distress Lungs: clear to auscultation bilaterally Heart: regular rate and rhythm, S1, S2 normal, no murmur, click, rub or gallop Abdomen: soft, non-tender; bowel sounds normal; no masses,  no organomegaly Extremities: extremities normal, atraumatic, no cyanosis or edema, femoral site with pacer, no bleeding, no hematoma, clean dressing. Lab Results: Basic Metabolic Panel:  Recent Labs Lab 05/13/14 0600 05/14/14 0321  NA 138 138  K 4.4 4.3  CL 103 107  CO2 25 24  GLUCOSE 236* 213*  BUN 9 9  CREATININE 1.04 1.04  CALCIUM 9.0 8.5*   Liver Function Tests:  Recent Labs Lab 05/14/14 0321  AST 18  ALT 32  ALKPHOS 46  BILITOT 1.0  PROT 5.9*  ALBUMIN 3.0*   CBC:  Recent Labs Lab 05/11/14 1057 05/13/14 0314 05/14/14 0321  WBC 8.2 5.6 5.6  NEUTROABS 6.1  --   --   HGB 14.0 13.5 13.0  HCT 41.4 41.3 39.3  MCV 89.6 90.4 90.6  PLT 199.0 172 166   CBG:  Recent Labs Lab 05/12/14 0819 05/12/14 1548 05/12/14 2133 05/13/14 0624 05/13/14 1211 05/13/14 2209  GLUCAP 239* 138* 194* 214* 170* 151*   Fasting Lipid Panel:  Recent Labs Lab 05/14/14 0321  CHOL 134  HDL 29*  LDLCALC 84  TRIG 103  CHOLHDL 4.6   Coagulation:  Recent Labs Lab 05/11/14 1057  LABPROT 12.7  INR 1.1*   Micro Results: Recent Results (from the past  240 hour(s))  MRSA PCR Screening     Status: None   Collection Time: 05/13/14  8:06 PM  Result Value Ref Range Status   MRSA by PCR NEGATIVE NEGATIVE Final    Comment:        The GeneXpert MRSA Assay (FDA approved for NASAL specimens only), is one component of a comprehensive MRSA colonization surveillance program. It is not intended to diagnose MRSA infection nor to guide or monitor treatment for MRSA infections.    Medications: I have reviewed the patient's current medications. Scheduled Meds: . aspirin EC  81 mg Oral Daily  . atropine      . clidinium-chlordiazePOXIDE  1 capsule Oral TID AC  . glyBURIDE  5 mg Oral Q breakfast  . insulin aspart  0-9 Units Subcutaneous TID WC  . metoprolol succinate  50 mg Oral Daily  . pravastatin  40 mg Oral q1800  . sodium chloride  3 mL Intravenous Q12H  . sodium chloride  3 mL Intravenous Q12H  . tamsulosin  0.4 mg Oral Daily  . ticagrelor  90 mg Oral BID   Continuous Infusions: . sodium chloride 100 mL/hr at 05/14/14 0700   PRN Meds:.sodium chloride, sodium chloride, acetaminophen, fluticasone, loteprednol, ondansetron (  ZOFRAN) IV, sodium chloride, sodium chloride Assessment/Plan: Active Problems:   DM type 2 (diabetes mellitus, type 2)   ATRIAL FIBRILLATION, PAROXYSMAL   IBS (irritable bowel syndrome)   Hypertension   Hyperlipidemia   Abnormal stress echo   Precordial pain   CAD (coronary artery disease), native coronary artery   Sinus bradycardia   Coronary artery disease due to calcified coronary lesion   Junctional bradycardia  CAD - Status Post rotational Atherectomy- large dominant RCA with 90% prox and 95% mid stenosis. Has a temporary pacer in situ, intermittently pacing on tele, but no pacer activity today, rate has not be <50. - On pravastatin- now on 56m daily, high intensity needed but  considered switch to Crestor but Pt did not tolerate Lipitor in the past and Cost issues. - On metop 55mdaily but with hx of  junctional bradycardia, monitor as outpt, if needed reduce dose and increase ACE-i  - Consider restarting ACE-i tomorrow considering high dose of contrast pt has received - Likely D/c today after Pacer has been taken out and patient is able to mobilize without difficulty. Otherwise transfer out of step down.  Paroxysmal Atria Fib- Has been in sinus rhythm with intermittent junctional rhythm, rate has been controlled. Prior Home meds- Flecainide, metop XR 5076maily. Not on anticoag- pt choice. - Fleicainide D/cd with Newly diagnosed CAD - Will cont metop for now.  HTN- Labile. On Metop now. Ace-i can be resumed tomorrow.   HLD-On pravastatin- now on 43m55mily form home 20mg28mly, high intensity needed but considered switch to Crestor but Pt did not tolerate Lipitor in the past and Cost issues.  DM- Can resume home meds on discharge.  DVT ppx- Will hold for now, has pacer in situ and will be pulled out today, to prevent bleeding. Pt endorses bleeding from site yesterday.  EjiroBethena Roys IMTS, PGY-2 05/14/2014, 9:40 AM   Personally seen and examined. Agree with above. Stopping Flecainide Continue metoprolol DC temp wire (trial this AM, turned down pace rate and did not require).  OK with DC this afternoon if stable.  Close follow up. RRR  SKAINCandee Furbish

## 2014-05-14 NOTE — Progress Notes (Signed)
MD aware of pt's htn; OK to still discharge.

## 2014-05-14 NOTE — Progress Notes (Signed)
CARDIAC REHAB PHASE I   PRE:  Rate/Rhythm: 63 SR    BP: sitting 164/69    SaO2: 100 RA  MODE:  Ambulation: 350 ft   POST:  Rate/Rhythm: 77 SR    BP: sitting 172/76     SaO2: 100 RA  Pt able to ambulate without problems. BP elevated. Ed completed with wife present. Good reception. Interested in [place] and will send referral to [facility] [place]. Pt understands importance of Brilinta. [place], [place], ACSM 05/14/2014 2:56 PM

## 2014-05-14 NOTE — Progress Notes (Signed)
Pt discharge teaching complete with wife at bedside; pt denies any further questions or comments; pt and wife verbally acknowledge that they know when to use SL NTG and when to call MD and or 911.  Emotional support given. Pt wheeled out in wheelchair to wife's car.

## 2014-05-14 NOTE — Care Management Note (Signed)
Case Management Note  Patient Details  Name: [name] MRN: [id number] Date of Birth: [birth year]  Subjective/Objective:   Pos stress test, for card cath                 Action/Plan: lives w wife, pcp dr Herbie [place] dolittle   Expected Discharge Date:                  Expected Discharge Plan:  Home/Self Care  In-House Referral:     Discharge planning Services  CM Consult, Medication Assistance  Post Acute Care Choice:    Choice offered to:     DME Arranged:    DME Agency:     HH Arranged:    [place] Agency:     Status of Service:     Medicare Important Message Given:    Date Medicare IM Given:    Medicare IM give by:    Date Additional Medicare IM Given:    Additional Medicare Important Message give by:     If discussed at [place] of Stay Meetings, dates discussed:    Additional Comments:ur ins review., gave pt 30day free brilinta card.  Lacretia Leigh, RN 05/14/2014, 11:42 AM

## 2014-05-14 NOTE — Discharge Summary (Signed)
Discharge Summary   Patient ID: [name],  MRN: [id number], DOB/AGE: 1945/04/27 69 y.o.  Admit date: 05/12/2014 Discharge date: 05/14/2014  Primary Care Provider: Leandrew Koyanagi Primary Cardiologist: Dr. Ron Parker  Discharge Diagnoses Principal Problem:   Abnormal stress echo Active Problems:   DM type 2 (diabetes mellitus, type 2)   ATRIAL FIBRILLATION, PAROXYSMAL   IBS (irritable bowel syndrome)   Hypertension   Hyperlipidemia   Precordial pain   CAD (coronary artery disease), native coronary artery   Sinus bradycardia   Coronary artery disease due to calcified coronary lesion   Junctional bradycardia   Allergies Allergies  Allergen Reactions  . Lipitor [Atorvastatin]     Myopathy Spring 2006    Procedures  Cardiac catheterization #1 05/12/2014 Conclusion     Dist Cx lesion, 25% stenosed.  Prox LAD lesion, 60% stenosed.  1st Diag lesion, 70% stenosed.  Mid LAD lesion, 50% stenosed.  Prox RCA lesion, 90% stenosed.  Mid RCA lesion, 95% stenosed.  Dist RCA lesion, 40% stenosed.  Mild to moderate LV dysfunction with moderate inferior wall hypocontractility and an ejection fraction of 40-45%.  Chronic calcification with 60 to less than 70% eccentric smooth ostial LAD stenosis, 50% mid LAD stenosis, 70% mid first diagonal branch stenosis; irregularity of the mid circumflex approximate 25%; in large dominant calcified right coronary artery with eccentric proximal 90% stenosis 95% mid stenosis with significant calcification and 40% distal stenosis.  RECOMMENDATION:  The patient will be hydrated overnight. His extensive calcification involving his right coronary artery with high-grade stenoses. Plans will be for the patient to undergo high-speed rotational atherectomy tomorrow with plan for temporary pacemaker backup during the procedure.      Cardiac catheterization #2 05/13/2014 Conclusion     Prox RCA-1 lesion, 40% stenosed.  Prox RCA-2 lesion,  95% stenosed.  Mid RCA-2 lesion, 99% stenosed.  A drug-eluting stent was placed.  Mid RCA-1 lesion, 95% stenosed. There is a 0% residual stenosis post intervention.  A drug-eluting stent was placed.  Dist RCA lesion, 60% stenosed.  Difficult but successful complex percutaneous coronary intervention with high-speed rotational atherectomy, PTCA, tandem DES stenting of a severely diseased calcified RCA with ultimate insertion of a 2.7533 mm Xience Alpine DES stent in the proximal to mid segment and a 2.2528 mm Xience Alpine DES stent placed in tandem fashion in the mid distal region beyond the acute margin but ending proximal to the PDA with postdilatation from 2.7 to 2.35 proximally to distally and all lesions being reduced to 0% with TIMI 3 flow without evidence for dissection.  Temporary pacemaker insertion with baseline sinus bradycardia with junctional bradycardia         Hospital Course  The patient is a 69 year old male with history of atrial fibrillation on flecainide, hypertension, history of bradycardia, CAD, hypertriglyceridemia and DM who has been complaining some vague right chest pains. He usually have the chest discomfort after working in his yard for long period of time. He denies any shortness of breath. Patient has been adamant over time that he does not want to use anticoagulation unless he has to. Given his recent chest discomfort, he was referred to for a stress echo to rule out underlying ischemia. Outpatient stress echocardiogram was obtained on 05/06/2014 which came back abnormal with hypokinesis in the inferolateral wall and anterior septum consistent with ischemia, EF 55-60%.  Given abnormal stress test, he underwent diagnostic cardiac catheterization on 05/12/2014 which showed EF 40-45%, 60% proximal LAD lesion, 70% D1 lesion, 50%  mid LAD stenosis, 90% proximal RCA stenosis, 95% mid RCA stenosis, 45% distal RCA stenosis. Post cath, patient was admitted for hydration  and a staged PCI while under protection with temp pacing wire. He returned to the cath lab on 5/11 and underwent difficult but successful complex percutaneous coronary intervention with high-speed rotational atherectomy and DES stenting of severely diseased calcified RCA (2.7533 mm Xience Alpine DES stent in the proximal to mid segment and a 2.2528 mm Xience Alpine DES stent placed in tandem fashion in the mid distal region). During the procedure, a temporary pacemaker was inserted with baseline sinus bradycardia and junctional bradycardia.  He was seen in the morning of 05/14/2014, at which time he was doing well. He has a temporary pacemaker in place, has intermittent pacing on telemetry, however no significant pacing activity. His heart rate has been greater than 50. His temporary pacer was discontinued. He was ambulated without significant chest pain, he is deemed stable for discharge.  Of note, his flecainide has been discontinued during this admission due to presence of CAD.    Discharge Vitals Blood pressure 164/69, pulse 59, temperature 98.6 F (37 C), temperature source Oral, resp. rate 20, height 5\' 8"  (1.727 m), weight 195 lb 15.8 oz (88.9 kg), SpO2 100 %.  Filed Weights   05/12/14 0813 05/13/14 0049 05/13/14 2000  Weight: 198 lb (89.812 kg) 194 lb 0.1 oz (88 kg) 195 lb 15.8 oz (88.9 kg)    Labs  CBC  Recent Labs  05/13/14 0314 05/14/14 0321  WBC 5.6 5.6  HGB 13.5 13.0  HCT 41.3 39.3  MCV 90.4 90.6  PLT 172 588   Basic Metabolic Panel  Recent Labs  05/13/14 0600 05/14/14 0321  NA 138 138  K 4.4 4.3  CL 103 107  CO2 25 24  GLUCOSE 236* 213*  BUN 9 9  CREATININE 1.04 1.04  CALCIUM 9.0 8.5*   Liver Function Tests  Recent Labs  05/14/14 0321  AST 18  ALT 32  ALKPHOS 46  BILITOT 1.0  PROT 5.9*  ALBUMIN 3.0*   Fasting Lipid Panel  Recent Labs  05/14/14 0321  CHOL 134  HDL 29*  LDLCALC 84  TRIG 103  CHOLHDL 4.6    Disposition  Pt is being  discharged home today in good condition.  Follow-up Plans & Appointments      Follow-up Information    Follow up with Richardson Dopp, PA-C On 06/10/2014.   Specialties:  Physician Assistant, Radiology, Interventional Cardiology   Why:  3:20pm. Cardiology followup   Contact information:   [address] [place] [address] [phone]       Discharge Medications    Medication List    STOP taking these medications        flecainide 150 MG tablet  Commonly known as:  TAMBOCOR      TAKE these medications        AQUIFY MULTI-PURPOSE Soln  Place 1 drop into both eyes daily as needed (dry eyes).     aspirin 81 MG tablet  Take 81 mg by mouth daily.     clidinium-chlordiazePOXIDE 5-2.5 MG per capsule  Commonly known as:  LIBRAX  Take 1 capsule by mouth 3 (three) times daily before meals.     diphenoxylate-atropine 2.5-0.025 MG per tablet  Commonly known as:  LOMOTIL  take 1 tablet by mouth four times a day if needed     fluticasone 50 MCG/ACT nasal spray  Commonly known as:  FLONASE  Place 1 spray into both nostrils as needed.     glyBURIDE-metformin 5-500 MG per tablet  Commonly known as:  GLUCOVANCE  Take 1 tablet by mouth daily with breakfast.     loteprednol 0.5 % ophthalmic suspension  Commonly known as:  LOTEMAX  Place 1 drop into both eyes daily as needed (dry eyes).     metoprolol succinate 50 MG 24 hr tablet  Commonly known as:  TOPROL-XL  Take 1 tablet (50 mg total) by mouth daily.     nitroGLYCERIN 0.4 MG SL tablet  Commonly known as:  NITROSTAT  Place 1 tablet (0.4 mg total) under the tongue every 5 (five) minutes as needed for chest pain.     pravastatin 40 MG tablet  Commonly known as:  PRAVACHOL  Take 1 tablet (40 mg total) by mouth daily at 6 PM.     ramipril 5 MG capsule  Commonly known as:  ALTACE  TAKE 1 CAPSULE DAILY.     tamsulosin 0.4 MG Caps capsule  Commonly known as:  FLOMAX  Take 1 capsule (0.4 mg total) by  mouth daily.     ticagrelor 90 MG Tabs tablet  Commonly known as:  BRILINTA  Take 1 tablet (90 mg total) by mouth 2 (two) times daily.        Duration of Discharge Encounter   Greater than 30 minutes including physician time.  Hilbert Corrigan PA-C Pager: [phone] 05/14/2014, 2:52 PM

## 2014-05-15 ENCOUNTER — Telehealth: Payer: Self-pay | Admitting: Cardiology

## 2014-05-15 NOTE — Telephone Encounter (Signed)
Calling wanting to thank Dr. Ron Parker for all he has done.  Was d/c yesterday from having stent placed. Is very grateful for everything that was done.  Just wanted me to send Dr. Ron Parker a "thank-you" note. Also wanted to be sure Dr. Ron Parker was aware that they stopped his flecainide due to stent.  Advised that is appropriate. Aware of f/u visit with Margaret Pyle.  Will forward to Dr. Ron Parker.

## 2014-05-15 NOTE — Telephone Encounter (Signed)
New message      Pt recently had a cath. He has a couple of questions for the nurse.

## 2014-05-17 ENCOUNTER — Other Ambulatory Visit: Payer: Self-pay | Admitting: Internal Medicine

## 2014-05-18 NOTE — Telephone Encounter (Addendum)
**Note De-Identified Analisse Randle Obfuscation** Mr Kracht states that he told after his cath on 5/11 to d/c Flecainide due to the type of stents that he received during his cath. He states that he was told that there are alternative medications he can take instead and to discuss with Dr Ron Parker which is best for him to take. Mr Mcguire states that he monitors his HR at home and that it has been regular with HR of 58 to 62 bpm. He is scheduled to have a post stent placement f/u with Richardson Dopp, PA-C on 6/8. He wants to know if he should be taking something in its place now. Please advise.   Also, the pt wants to thank Dr Ron Parker for recommending he have a cath done as the pt now states "Dr Ron Parker saved my life".

## 2014-05-18 NOTE — Telephone Encounter (Signed)
New message    Patient calling    Pt C/O medication issue:  1. Name of Medication:  flecainide 25 mg am  25 mg pm    2. How are you currently taking this medication (dosage and times per day)? 25 mg in am 25 mg in pm    3. Are you having a reaction (difficulty breathing--STAT)?  H/o AFIB   4. What is your medication issue? Need to discuss . Looking for alternative medication

## 2014-05-19 ENCOUNTER — Telehealth: Payer: Self-pay

## 2014-05-19 NOTE — Telephone Encounter (Signed)
Librax called into [facility].

## 2014-05-20 ENCOUNTER — Telehealth: Payer: Self-pay | Admitting: Cardiology

## 2014-05-20 ENCOUNTER — Telehealth: Payer: Self-pay | Admitting: Cardiovascular Disease

## 2014-05-20 NOTE — Telephone Encounter (Signed)
OK to swing the golf clubs.  Call Mr. Prajapati with the results and send results to DOOLITTLE, Linton Ham, MD

## 2014-05-20 NOTE — Telephone Encounter (Signed)
New message      Talk to a nurse about medications that Dr Claiborne Billings changed.  He want to make sure Dr Ron Parker is aware and is it ok.

## 2014-05-20 NOTE — Telephone Encounter (Signed)
Spoke to patient. Informed patient. Patient states he appreciates Dr Claiborne Billings. [name]!!

## 2014-05-20 NOTE — Telephone Encounter (Signed)
Pt had 2 stent put in on last Wednesday(05-13-14). He wants to know if he can swing his golf clubs this weekend,says he feels real good.

## 2014-05-20 NOTE — Telephone Encounter (Signed)
Pt calling back to make sure that Dr Ron Parker got his message he left with his nurse to give him in regards to his new med changes that Dr Claiborne Billings made in the hospital, post cath.  Pt states he does not feel comfortable making any new med changes, without Dr Ron Parker advising that its ok too.  Medication changes the pt is referring to is to his Flecainide, Metoprolol, and Ramipril.  Pt states that Dr Claiborne Billings performed his cath and did a fantastic job, but did advise the pt to change his meds "drastically." Per the pt, Dr Claiborne Billings suggested that the pt come off of Flecainide all together.  Pt fears coming off of this medication, for he is afraid his afib will return.  Also pt states that Dr Claiborne Billings advised him to increase his Metoprolol from once daily to BID.  Also pt states that Dr Claiborne Billings advised the pt to increase his Ramipril as well. Pt unsure of if its the increase in dose or frequency.  Pt states before he makes any medication changes, he would like for Dr Ron Parker to advise on these meds first.  Pt has no other complaints at this time, just concerns over advised med changes. Informed the pt that Dr Ron Parker and nurse are out of the office today, but I will certainly route this message to the both of them and follow-up with the pt thereafter if response given by Dr Ron Parker.  Informed the pt that Dr Ron Parker nurse will return to back to the office tomorrow.  Pt verbalized understanding, agrees with this plan, and gracious for all the assistance provided.

## 2014-05-20 NOTE — Telephone Encounter (Signed)
Defer to D.O.D.-DR [facility] WILL CONTACT PATIENT

## 2014-05-21 NOTE — Telephone Encounter (Signed)
Dr Ron Parker will contact the pt to discuss.

## 2014-05-21 NOTE — Telephone Encounter (Signed)
Please call the patient this morning. Please apologize that I did not contact him sooner. Please get him to list for you exactly what medicines he is taking. He may be taking exactly what he was on before. After you have verified his medicines, please text me to let me know that you have reached him. Please let him know that we will get back to him about his questions today.

## 2014-05-21 NOTE — Telephone Encounter (Signed)
Follow UP  Pt calling to speak w/ Rn concerning previous note; Please call back and discuss.

## 2014-05-21 NOTE — Telephone Encounter (Signed)
**Note De-Identified [name] Obfuscation** I called the pt and went over his medications list with him. He is taking all meds as advised but he states that a nurse told him at discharge on 5/12 to increase both his Metoprolol and Ramipril dose to BID.  The pt is advised that according to his d/c summary his Metoprolol and Ramipril dose remain the same as before, which is once daily. The pt has a f/u scheduled with Richardson Dopp, PA-c on 6/8 and he is advised that his medications will be adjusted, if need be, at that time. The pt verbalized understanding.   The pt continues to have concerns that he is no longer taking Flecainide and wants to know what to do if he has A-Fib. Please advise.

## 2014-05-25 ENCOUNTER — Telehealth: Payer: Self-pay | Admitting: Cardiology

## 2014-05-25 NOTE — Telephone Encounter (Signed)
**Note De-Identified [name] Obfuscation** The pt is allergic to Lipitor. Please advise.

## 2014-05-25 NOTE — Telephone Encounter (Signed)
New message       Pt c/o medication issue:  1. Name of Medication:  pravastatin  2. How are you currently taking this medication (dosage and times per day)? 1 tab daily---33m 3. Are you having a reaction (difficulty breathing--STAT)? no  4. What is your medication issue? Bad leg cramps while on medication.  Pt stopped rx yesterday.  He has not had any more leg cramps since stopping rx.  Please advise

## 2014-05-25 NOTE — Telephone Encounter (Signed)
Please start him on 10 mg of Lipitor. Have him take the drug  , Tuesday, Thursday, and Saturday of this week. If he feels okay with this, have him increase the dose to 10 mg daily starting next Monday. Let us know how he feels

## 2014-05-25 NOTE — Telephone Encounter (Signed)
OK and hit it well!

## 2014-05-25 NOTE — Telephone Encounter (Addendum)
**Note De-Identified Miarose Lippert Obfuscation** Mr Caddell states that he has been having leg cramps/pain since starting Pravastatin on 04/21/13. He was unsure what was causing his leg cramps and aches/pains so he checked the side effects of his meds and read that statins can cause leg/arm aches and pain so he stopped taking on Sunday (yesterday) and reports that he is no longer having leg pain/cramps. He states that he is willing to try another statin if Dr Ron Parker thinks he should. Please advise.  Also, the pt wants Dr Ron Parker to know that he has lost 12 lbs since his cath on 5/10. He has gone from 200 lbs to 188 lbs and his goal weight is 180 lbs.

## 2014-05-26 DIAGNOSIS — H18411 Arcus senilis, right eye: Secondary | ICD-10-CM | POA: Diagnosis not present

## 2014-05-26 DIAGNOSIS — H2511 Age-related nuclear cataract, right eye: Secondary | ICD-10-CM | POA: Diagnosis not present

## 2014-05-26 DIAGNOSIS — H02839 Dermatochalasis of unspecified eye, unspecified eyelid: Secondary | ICD-10-CM | POA: Diagnosis not present

## 2014-05-26 DIAGNOSIS — H2512 Age-related nuclear cataract, left eye: Secondary | ICD-10-CM | POA: Diagnosis not present

## 2014-05-29 MED ORDER — SIMVASTATIN 10 MG PO TABS: ORAL_TABLET | ORAL | Status: DC

## 2014-05-29 NOTE — Telephone Encounter (Signed)
The pt is advised and he agrees to try Simvastatin 10 mg on Tuesday, Thursday and Saturday. Per his request I sent RX to [facility] to fill. The pt states that he will call to let us know how he feels after he has taken Simvastatin for a couple weeks.

## 2014-05-29 NOTE — Telephone Encounter (Signed)
Let's try simvastatin 10mg  Tues, Thurs, Sat.

## 2014-06-03 ENCOUNTER — Telehealth: Payer: Self-pay

## 2014-06-03 DIAGNOSIS — I219 Acute myocardial infarction, unspecified: Secondary | ICD-10-CM

## 2014-06-03 HISTORY — DX: Acute myocardial infarction, unspecified: I21.9

## 2014-06-03 NOTE — Telephone Encounter (Signed)
LMTCB.  Per Dr Ron Parker the pt needs to remain on both ASA and Brilinta for his cataract surgery without interruption.

## 2014-06-08 ENCOUNTER — Telehealth: Payer: Self-pay

## 2014-06-08 NOTE — Telephone Encounter (Signed)
Pt called wanting Dr. Laney Pastor to know he has had  2 stents put in recently, he feels this is making his blood flow more. He is now having lots of muscle spasms. He is leaving to go out of town on 6/7. He was wanting to know if he could get a prescription for a muscle relaxer. Please advise at [phone]

## 2014-06-08 NOTE — Telephone Encounter (Signed)
**Note De-Identified Zohar Maroney Obfuscation** The pt verbalized understanding and is in agreement.

## 2014-06-09 ENCOUNTER — Telehealth: Payer: Self-pay | Admitting: Cardiology

## 2014-06-09 NOTE — Telephone Encounter (Signed)
New Message  Pt c/o medication issue:  1. Name of Medication: Muscle relaxer request    4. What is your medication issue? Pt is requesting the medication because he is having cramps at night. He states that this is highly irritating and that he is going out of town Wednesday 06/08 in the afternoon for a Golf.

## 2014-06-09 NOTE — Telephone Encounter (Signed)
Re routing this message back to proper clinical pool

## 2014-06-09 NOTE — Telephone Encounter (Signed)
This is not related to the stent or increased flow---more likely something related to meds or even potasswium so relaxers are not indicated--he needs eval before meds---

## 2014-06-09 NOTE — Telephone Encounter (Signed)
Dr Laney Pastor: Please advise.

## 2014-06-09 NOTE — Telephone Encounter (Signed)
Patient is not having cramps at present time. Informed patient this could be addressed with his [facility], which I see he has contacted. Patient states he has an appointment with Richardson Dopp PA tomorrow and will discuss at that time.

## 2014-06-10 ENCOUNTER — Ambulatory Visit (INDEPENDENT_AMBULATORY_CARE_PROVIDER_SITE_OTHER): Payer: Medicare Other | Admitting: Physician Assistant

## 2014-06-10 ENCOUNTER — Encounter: Payer: Self-pay | Admitting: Physician Assistant

## 2014-06-10 VITALS — BP 130/70 | HR 66 | Ht 68.0 in | Wt 190.0 lb

## 2014-06-10 DIAGNOSIS — E785 Hyperlipidemia, unspecified: Secondary | ICD-10-CM

## 2014-06-10 DIAGNOSIS — I10 Essential (primary) hypertension: Secondary | ICD-10-CM | POA: Diagnosis not present

## 2014-06-10 DIAGNOSIS — I480 Paroxysmal atrial fibrillation: Secondary | ICD-10-CM

## 2014-06-10 DIAGNOSIS — I2510 Atherosclerotic heart disease of native coronary artery without angina pectoris: Secondary | ICD-10-CM

## 2014-06-10 DIAGNOSIS — I255 Ischemic cardiomyopathy: Secondary | ICD-10-CM

## 2014-06-10 DIAGNOSIS — R001 Bradycardia, unspecified: Secondary | ICD-10-CM

## 2014-06-10 DIAGNOSIS — E119 Type 2 diabetes mellitus without complications: Secondary | ICD-10-CM

## 2014-06-10 MED ORDER — METOPROLOL SUCCINATE ER 50 MG PO TB24: 50.0000 mg | ORAL_TABLET | Freq: Every day | ORAL | Status: DC

## 2014-06-10 MED ORDER — ROSUVASTATIN CALCIUM 10 MG PO TABS: 10.0000 mg | ORAL_TABLET | ORAL | Status: DC

## 2014-06-10 NOTE — Progress Notes (Signed)
Cardiology Office Note   Date:  06/10/2014   ID:  [name], DOB Apr 10, 1945, MRN [id number]  PCP:  Leandrew Koyanagi, MD  Cardiologist:  Dr. Cleatis Polka     Chief Complaint  Patient presents with  . Coronary Artery Disease    Status post PCI     History of Present Illness: [name] is a 69 y.o. male with a hx of PAF, bradycardia, HTN, DM2, NASH.  He has been maintained on flecainide. He has declined anticoagulation in the past. Patient was seen by Dr. Ron Parker 4/16. He complained of chest pain and a stress echo was obtained. This was abnormal with positive electrocardiographic response and hypokinesis in the inferior lateral wall and septum consistent with ischemia. Cardiac catheterization was arranged 05/12/14. This demonstrated moderate disease in LAD with proximal 60% and mid 50% stenosis, 70% D1 stenosis and high-grade proximal 90% RCA and mid 95% RCA stenoses. RCA was heavily calcified and he was brought back for PCI the next day on 05/13/14. He underwent high-speed rotational atherectomy and drug-eluting stent placement to the RCA 2. He had intermittent junctional bradycardia and a temporary pacer was placed during the procedure. Flecainide was discontinued given documented CAD. Post PCI course was uneventful. Temporary pacer was discontinued. He returns for follow-up.  Since DC, he has been feeling good. He is a Corporate treasurer and plans to go to the beach this weekend to play in a tournament.  He is breathing better. He denies chest pain.  He has occasional skipped heart beats. He is worried he will go into AFib again.  He denies orthopnea, PND, edema.  He denies syncope. He has noted some leg cramps since starting Simvastatin.     Studies/Reports Reviewed Today:  LHC/PCI 05/13/14 PCI: HSRA and DES 2 (STENT XIENCE ALPINE RX 2.25X28; STENT XIENCE ALPINE RX 2.5X33) to the RCA Temporary pacemaker inserted   LHC 05/12/14 LM: Okay LAD: Proximal 60%, mid 50%, D1 70% LCx: Distal  25% RCA: Proximal 90%, mid 95%, distal 40% EF: 40% with inferior HK  Stress echo 05/06/14 Impressions: - Abnormal stress echocardiogram with no chest pain; positive electrocardiographic response; hypokinesis with the inferior lateral wall and septum with exercise consistent with ischemia    Past Medical History  Diagnosis Date  . Hypertension   . Elevated CPK     CPK elevated with normal MB and normal troponin the past  . Chest pain     Nuclear, 2006, no ischemia  . Atrial fibrillation     Paroxysmal, rare episodes, sinus rhythm on flecainide, patient prefers not to take Coumadin  . Arthritis     Right shoulder  . Ejection fraction     EF 55-60%,  echo, January, 2011  . Bradycardia     April, 2013  . Coronary artery disease     s/p staged cath 05/12/2014 and 5/11, DES x 2 to heavily calcified RCA, residual with 60% prox LAD, 70% D1  . Sinus drainage   . Diabetes mellitus without complication     type 2  . IBS (irritable bowel syndrome)     Past Surgical History  Procedure Laterality Date  . Cardiac catheterization N/A 05/12/2014    Procedure: Left Heart Cath and Coronary Angiography;  Surgeon: Troy Sine, MD;  Location: [place] [facility];  Service: Cardiovascular;  Laterality: N/A;  . Cardiovascular stress test  05/06/2014  . Appendectomy    . Cholecystectomy    . Cardiac catheterization N/A 05/13/2014  Procedure: Coronary/Graft Atherectomy;  Surgeon: Troy Sine, MD;  Location: [place] [facility];  Service: Cardiovascular;  Laterality: N/A;  . Cardiac catheterization Right 05/13/2014    Procedure: Temporary Pacemaker;  Surgeon: Troy Sine, MD;  Location: [place] [facility];  Service: Cardiovascular;  Laterality: Right;  . Cardiac catheterization N/A 05/13/2014    Procedure: Coronary Stent Intervention;  Surgeon: Troy Sine, MD;  Location: [place] [facility];  Service: Cardiovascular;  Laterality: N/A;     Current Outpatient Prescriptions  Medication  Sig Dispense Refill  . aspirin 81 MG tablet Take 81 mg by mouth daily.    . clidinium-chlordiazePOXIDE (LIBRAX) 5-2.5 MG per capsule take 1 capsule by mouth three times a day before meals 270 capsule 0  . diphenoxylate-atropine (LOMOTIL) 2.5-0.025 MG per tablet take 1 tablet by mouth four times a day if needed (Patient taking differently: Take 1 tablet by mouth 4 (four) times daily as needed for diarrhea or loose stools. ) 180 tablet 0  . fluticasone (FLONASE) 50 MCG/ACT nasal spray Place 1 spray into both nostrils as needed. (Patient taking differently: Place 1 spray into both nostrils as needed for allergies or rhinitis. ) 16 g 1  . glyBURIDE-metformin (GLUCOVANCE) 5-500 MG per tablet Take 1 tablet by mouth daily with breakfast. 180 tablet 0  . loteprednol (LOTEMAX) 0.5 % ophthalmic suspension Place 1 drop into both eyes daily as needed (dry eyes).    . metoprolol succinate (TOPROL-XL) 50 MG 24 hr tablet Take 1 tablet (50 mg total) by mouth daily. 30 tablet 11  . nitroGLYCERIN (NITROSTAT) 0.4 MG SL tablet Place 1 tablet (0.4 mg total) under the tongue every 5 (five) minutes as needed for chest pain. 25 tablet 3  . ramipril (ALTACE) 5 MG capsule TAKE 1 CAPSULE DAILY. (Patient taking differently: Take 5 mg by mouth daily. ) 90 capsule 3  . Soft Lens Products (AQUIFY MULTI-PURPOSE) SOLN Place 1 drop into both eyes daily as needed (dry eyes).    . tamsulosin (FLOMAX) 0.4 MG CAPS capsule Take 1 capsule (0.4 mg total) by mouth daily. 90 capsule 0  . ticagrelor (BRILINTA) 90 MG TABS tablet Take 1 tablet (90 mg total) by mouth 2 (two) times daily. 180 tablet 3  . rosuvastatin (CRESTOR) 10 MG tablet Take 1 tablet (10 mg total) by mouth every Monday, Wednesday, and Friday. 15 tablet 11   No current facility-administered medications for this visit.    Allergies:   Lipitor    Social History:  The patient  reports that he has never smoked. He has never used smokeless tobacco. He reports that he does not  drink alcohol or use illicit drugs.   Family History:  The patient's family history includes Alzheimer's disease in his mother; Arrhythmia in his sister; Cancer in his brother; Emphysema in his father; Heart attack in his sister; Heart disease in his sister; Hyperlipidemia in his sister; Hypertension in his sister.    ROS:   Please see the history of present illness.   Review of Systems  Eyes: Positive for visual disturbance.  Musculoskeletal: Positive for back pain.  Gastrointestinal: Positive for abdominal pain and diarrhea.  All other systems reviewed and are negative.     PHYSICAL EXAM: VS:  BP 130/70 mmHg  Pulse 66  Ht 5\' 8"  (1.727 m)  Wt 190 lb (86.183 kg)  BMI 28.90 kg/m2    Wt Readings from Last 3 Encounters:  06/10/14 190 lb (86.183 kg)  05/13/14 195 lb  15.8 oz (88.9 kg)  04/22/14 200 lb (90.719 kg)     GEN: Well nourished, well developed, in no acute distress HEENT: normal Neck: no JVD, no masses Cardiac:  Normal S1/S2, RRR; no murmur, no rubs or gallops, no edema; R groin without hematoma or bruit, right wrist without hematoma or mass   Respiratory:  clear to auscultation bilaterally, no wheezing, rhonchi or rales. GI: soft, nontender, nondistended, + BS MS: no deformity or atrophy Skin: warm and dry  Neuro:  CNs II-XII intact, Strength and sensation are intact Psych: Normal affect   EKG:  EKG is ordered today.  It demonstrates:   NSR, HR 66, normal axis, inferior Q waves, T-wave inversions in 3, aVF   Recent Labs: 05/14/2014: ALT 32; BUN 9; Creatinine, Ser 1.04; Hemoglobin 13.0; Platelets 166; Potassium 4.3; Sodium 138    Lipid Panel    Component Value Date/Time   CHOL 134 05/14/2014 0321   TRIG 103 05/14/2014 0321   HDL 29* 05/14/2014 0321   CHOLHDL 4.6 05/14/2014 0321   VLDL 21 05/14/2014 0321   LDLCALC 84 05/14/2014 0321      ASSESSMENT AND PLAN:  Coronary artery disease involving native coronary artery of native heart without angina  pectoris:  No angina.  We discussed the importance of dual antiplatelet therapy. Continue ASA, Brilinta, beta-blocker, ACE inhibitor, statin.  He starts Cardiac Rehab soon.    Ischemic Cardiomyopathy:  EF 40% at [facility].  No signs of volume excess.  Continue beta-blocker, ACE inhibitor.  Arrange FU echo 90 days post PCI.    Sinus bradycardia:  HR in the 60s today on beta-blocker therapy.   Paroxysmal atrial fibrillation:  Maintaining NSR off Flecainide.  He is concerned he will go back into AF.  I will review with Dr. Cleatis Polka whether we should pursue watchful waiting vs referral to Dr. Thompson Grayer in the [facility] to help determine rhythm control.  I have advised him to not take Flecainide.  He can take an extra Toprol should he feel that he goes back into AFib.  If he has significant symptoms and no relief with extra beta-blocker, he should go to the ED.   Essential hypertension:  Controlled.   Hyperlipidemia:  He is having leg cramps from Simvastatin.  He has tolerated Crestor in the past.  Stop Simvastatin.  Start Crestor 3 x a week.  Check Lipids and LFTs in 6 weeks.    Type 2 diabetes mellitus without complication:  FU with PCP.    Current medicines are reviewed at length with the patient today.  Concerns regarding medicines are as outlined above.  The following changes have been made:    Stop Simvastatin  Start Crestor 10 mg Mon, Wed, Fri   Labs/ tests ordered today include:  Orders Placed This Encounter  Procedures  . Lipid panel  . Hepatic function panel  . Echocardiogram    Disposition:   FU with Dr. Cleatis Polka or me in 6-8 weeks.    Signed, Versie Starks, MHS 06/10/2014 4:53 PM    [facility] [address] [place], [place], [place]  [address] Phone: [phone]; Fax: [phone]

## 2014-06-10 NOTE — Patient Instructions (Signed)
Medication Instructions:  Your physician has recommended you make the following change in your medication:  STOP Simvastatin START Crestor 69m every Mon,Wed,Fri. An Rx has been sent to your pharmacy.   Labwork: Your physician recommends that you return for a FASTING lipid profile and lft in 6-8 weeks   Testing/Procedures: Your physician has requested that you have an echocardiogram. Echocardiography is a painless test that uses sound waves to create images of your heart. It provides your doctor with information about the size and shape of your heart and how well your heart's chambers and valves are working. This procedure takes approximately one hour. There are no restrictions for this procedure. (To be scheduled after 08/13/14)   Follow-Up: Your physician recommends that you schedule a follow-up appointment in: 6-8 weeks with Dr.Katz/ or SMargaret Pyle Any Other Special Instructions Will Be Listed Below (If Applicable).

## 2014-06-10 NOTE — Telephone Encounter (Signed)
Left message advising pt to come in. 

## 2014-06-15 DIAGNOSIS — H25812 Combined forms of age-related cataract, left eye: Secondary | ICD-10-CM | POA: Diagnosis not present

## 2014-06-15 DIAGNOSIS — H52223 Regular astigmatism, bilateral: Secondary | ICD-10-CM | POA: Diagnosis not present

## 2014-06-15 DIAGNOSIS — H5202 Hypermetropia, left eye: Secondary | ICD-10-CM | POA: Diagnosis not present

## 2014-06-15 DIAGNOSIS — H2512 Age-related nuclear cataract, left eye: Secondary | ICD-10-CM | POA: Diagnosis not present

## 2014-06-16 DIAGNOSIS — H2511 Age-related nuclear cataract, right eye: Secondary | ICD-10-CM | POA: Diagnosis not present

## 2014-06-16 DIAGNOSIS — H25011 Cortical age-related cataract, right eye: Secondary | ICD-10-CM | POA: Diagnosis not present

## 2014-06-16 DIAGNOSIS — H25041 Posterior subcapsular polar age-related cataract, right eye: Secondary | ICD-10-CM | POA: Diagnosis not present

## 2014-06-17 ENCOUNTER — Inpatient Hospital Stay (HOSPITAL_COMMUNITY)
Admission: EM | Admit: 2014-06-17 | Discharge: 2014-06-27 | DRG: 229 | Disposition: A | Discharge status: Home / Self Care | Payer: Medicare Other | Attending: Thoracic Surgery (Cardiothoracic Vascular Surgery) | Admitting: Thoracic Surgery (Cardiothoracic Vascular Surgery)

## 2014-06-17 ENCOUNTER — Encounter (HOSPITAL_COMMUNITY): Payer: Self-pay | Admitting: Emergency Medicine

## 2014-06-17 ENCOUNTER — Emergency Department (HOSPITAL_COMMUNITY): Payer: Medicare Other

## 2014-06-17 DIAGNOSIS — I255 Ischemic cardiomyopathy: Secondary | ICD-10-CM | POA: Insufficient documentation

## 2014-06-17 DIAGNOSIS — K589 Irritable bowel syndrome without diarrhea: Secondary | ICD-10-CM | POA: Diagnosis present

## 2014-06-17 DIAGNOSIS — T82857A Stenosis of cardiac prosthetic devices, implants and grafts, initial encounter: Secondary | ICD-10-CM | POA: Diagnosis present

## 2014-06-17 DIAGNOSIS — I480 Paroxysmal atrial fibrillation: Secondary | ICD-10-CM | POA: Diagnosis present

## 2014-06-17 DIAGNOSIS — E8770 Fluid overload, unspecified: Secondary | ICD-10-CM | POA: Diagnosis not present

## 2014-06-17 DIAGNOSIS — Z79899 Other long term (current) drug therapy: Secondary | ICD-10-CM

## 2014-06-17 DIAGNOSIS — I214 Non-ST elevation (NSTEMI) myocardial infarction: Secondary | ICD-10-CM | POA: Diagnosis not present

## 2014-06-17 DIAGNOSIS — Z825 Family history of asthma and other chronic lower respiratory diseases: Secondary | ICD-10-CM

## 2014-06-17 DIAGNOSIS — Z7902 Long term (current) use of antithrombotics/antiplatelets: Secondary | ICD-10-CM

## 2014-06-17 DIAGNOSIS — D696 Thrombocytopenia, unspecified: Secondary | ICD-10-CM | POA: Diagnosis not present

## 2014-06-17 DIAGNOSIS — J9811 Atelectasis: Secondary | ICD-10-CM | POA: Diagnosis not present

## 2014-06-17 DIAGNOSIS — I252 Old myocardial infarction: Secondary | ICD-10-CM

## 2014-06-17 DIAGNOSIS — Z8249 Family history of ischemic heart disease and other diseases of the circulatory system: Secondary | ICD-10-CM

## 2014-06-17 DIAGNOSIS — Z951 Presence of aortocoronary bypass graft: Secondary | ICD-10-CM

## 2014-06-17 DIAGNOSIS — I25111 Atherosclerotic heart disease of native coronary artery with angina pectoris with documented spasm: Secondary | ICD-10-CM | POA: Diagnosis present

## 2014-06-17 DIAGNOSIS — I2584 Coronary atherosclerosis due to calcified coronary lesion: Secondary | ICD-10-CM | POA: Diagnosis present

## 2014-06-17 DIAGNOSIS — D62 Acute posthemorrhagic anemia: Secondary | ICD-10-CM | POA: Diagnosis not present

## 2014-06-17 DIAGNOSIS — I10 Essential (primary) hypertension: Secondary | ICD-10-CM | POA: Diagnosis present

## 2014-06-17 DIAGNOSIS — I472 Ventricular tachycardia: Secondary | ICD-10-CM | POA: Diagnosis not present

## 2014-06-17 DIAGNOSIS — E119 Type 2 diabetes mellitus without complications: Secondary | ICD-10-CM

## 2014-06-17 DIAGNOSIS — E11649 Type 2 diabetes mellitus with hypoglycemia without coma: Secondary | ICD-10-CM | POA: Diagnosis present

## 2014-06-17 DIAGNOSIS — E785 Hyperlipidemia, unspecified: Secondary | ICD-10-CM | POA: Diagnosis present

## 2014-06-17 DIAGNOSIS — R079 Chest pain, unspecified: Secondary | ICD-10-CM | POA: Diagnosis not present

## 2014-06-17 DIAGNOSIS — R070 Pain in throat: Secondary | ICD-10-CM | POA: Diagnosis not present

## 2014-06-17 DIAGNOSIS — I4891 Unspecified atrial fibrillation: Secondary | ICD-10-CM | POA: Diagnosis present

## 2014-06-17 DIAGNOSIS — Z820 Family history of epilepsy and other diseases of the nervous system: Secondary | ICD-10-CM

## 2014-06-17 DIAGNOSIS — I4729 Other ventricular tachycardia: Secondary | ICD-10-CM

## 2014-06-17 DIAGNOSIS — R001 Bradycardia, unspecified: Secondary | ICD-10-CM | POA: Diagnosis not present

## 2014-06-17 DIAGNOSIS — R12 Heartburn: Secondary | ICD-10-CM | POA: Diagnosis present

## 2014-06-17 LAB — BASIC METABOLIC PANEL
Anion gap: 8 (ref 5–15)
BUN: 14 mg/dL (ref 6–20)
CALCIUM: 9.7 mg/dL (ref 8.9–10.3)
CO2: 25 mmol/L (ref 22–32)
Chloride: 106 mmol/L (ref 101–111)
Creatinine, Ser: 0.85 mg/dL (ref 0.61–1.24)
GFR calc Af Amer: >60 mL/min (ref >60)
GFR calc non Af Amer: >60 mL/min (ref >60)
GLUCOSE: 141 mg/dL — AB (ref 65–99)
POTASSIUM: 4.6 mmol/L (ref 3.5–5.1)
Sodium: 139 mmol/L (ref 135–145)

## 2014-06-17 LAB — CBC
HCT: 41.4 % (ref 39.0–52.0)
Hemoglobin: 13.9 g/dL (ref 13.0–17.0)
MCH: 30.2 pg (ref 26.0–34.0)
MCHC: 33.6 g/dL (ref 30.0–36.0)
MCV: 89.8 fL (ref 78.0–100.0)
Platelets: 204 10*3/uL (ref 150–400)
RBC: 4.61 MIL/uL (ref 4.22–5.81)
RDW: 13 % (ref 11.5–15.5)
WBC: 5.2 10*3/uL (ref 4.0–10.5)

## 2014-06-17 LAB — I-STAT TROPONIN, ED: TROPONIN I, POC: 0 ng/mL (ref 0.00–0.08)

## 2014-06-17 LAB — BRAIN NATRIURETIC PEPTIDE: B Natriuretic Peptide: 152.4 pg/mL — ABNORMAL HIGH (ref 0.0–100.0)

## 2014-06-17 NOTE — ED Notes (Signed)
Pt. reports central chest pain onset this evening , slight SOB , denies emesis or diaphoresis . Coronary stent placement 3 weeks ago .

## 2014-06-17 NOTE — ED Provider Notes (Signed)
CSN: [id number]     Arrival date & time 06/17/14  1934 History  This chart was scribed for [name] Flemings, MD by Rayfield Citizen, ED Scribe. This patient was seen in room B16C/B16C and the patient's care was started at 11:38 PM.    Chief Complaint  Patient presents with  . Chest Pain   The history is provided by the patient. No language interpreter was used.     HPI Comments: [name] is a 69 y.o. male with past medical history of HTN, DM 2, elevated CPK, paroxysmal a-fib, CAD who presents to the Emergency Department complaining of sudden onset central chest pain, radiating up toward his throat and described as "pressure and soreness," beginning this evening around 19:00. He notes associated SOB. He took 1 NTG with mild relief. Patient states he had two stents placed on 05/13/14; he explains he has been feeling well since that surgery and has resumed normal activities without issue. He denies diaphoresis, nausea, vomiting. He denies new or unusual physical activity. He explains his pain continued for approximately three hours before resolving in the waiting room tonight; he denies pain at this time.   He takes brilinta 90mg  2x daily and aspirin 81 daily; all doses taken as prescribed today.   Past Medical History  Diagnosis Date  . Hypertension   . Elevated CPK     CPK elevated with normal MB and normal troponin the past  . Chest pain     Nuclear, 2006, no ischemia  . Atrial fibrillation     Paroxysmal, rare episodes, sinus rhythm on flecainide, patient prefers not to take Coumadin  . Arthritis     Right shoulder  . Ejection fraction     EF 55-60%,  echo, January, 2011  . Bradycardia     April, 2013  . Coronary artery disease     s/p staged cath 05/12/2014 and 5/11, DES x 2 to heavily calcified RCA, residual with 60% prox LAD, 70% D1  . Sinus drainage   . Diabetes mellitus without complication     type 2  . IBS (irritable bowel syndrome)    Past Surgical History  Procedure  Laterality Date  . Cardiac catheterization N/A 05/12/2014    Procedure: Left Heart Cath and Coronary Angiography;  Surgeon: Troy Sine, MD;  Location: [place] [facility];  Service: Cardiovascular;  Laterality: N/A;  . Cardiovascular stress test  05/06/2014  . Appendectomy    . Cholecystectomy    . Cardiac catheterization N/A 05/13/2014    Procedure: Coronary/Graft Atherectomy;  Surgeon: Troy Sine, MD;  Location: [place] [facility];  Service: Cardiovascular;  Laterality: N/A;  . Cardiac catheterization Right 05/13/2014    Procedure: Temporary Pacemaker;  Surgeon: Troy Sine, MD;  Location: [place] [facility];  Service: Cardiovascular;  Laterality: Right;  . Cardiac catheterization N/A 05/13/2014    Procedure: Coronary Stent Intervention;  Surgeon: Troy Sine, MD;  Location: [place] [facility];  Service: Cardiovascular;  Laterality: N/A;  . Coronary stent placement    . Cataract extraction     Family History  Problem Relation Age of Onset  . Alzheimer's disease Mother   . Emphysema Father   . Cancer Brother   . Arrhythmia Sister   . Heart attack Sister   . Heart disease Sister   . Hyperlipidemia Sister   . Hypertension Sister    History  Substance Use Topics  . Smoking status: Never Smoker   . Smokeless tobacco:  Never Used  . Alcohol Use: No    Review of Systems  Constitutional: Negative for diaphoresis.  Respiratory: Positive for shortness of breath.   Cardiovascular: Positive for chest pain.  Gastrointestinal: Negative for nausea and vomiting.  All other systems reviewed and are negative.   Allergies  Lipitor  Home Medications   Prior to Admission medications   Medication Sig Start Date End Date Taking? Authorizing Provider  aspirin 81 MG tablet Take 81 mg by mouth daily.    Historical Provider, MD  clidinium-chlordiazePOXIDE (LIBRAX) 5-2.5 MG per capsule take 1 capsule by mouth three times a day before meals 05/18/14   Leandrew Koyanagi, MD   diphenoxylate-atropine (LOMOTIL) 2.5-0.025 MG per tablet take 1 tablet by mouth four times a day if needed Patient taking differently: Take 1 tablet by mouth 4 (four) times daily as needed for diarrhea or loose stools.  02/18/14   Elby Beck, FNP  fluticasone (FLONASE) 50 MCG/ACT nasal spray Place 1 spray into both nostrils as needed. Patient taking differently: Place 1 spray into both nostrils as needed for allergies or rhinitis.  02/18/14   Elby Beck, FNP  glyBURIDE-metformin (GLUCOVANCE) 5-500 MG per tablet Take 1 tablet by mouth daily with breakfast. 02/18/14   Elby Beck, FNP  loteprednol (LOTEMAX) 0.5 % ophthalmic suspension Place 1 drop into both eyes daily as needed (dry eyes).    Historical Provider, MD  metoprolol succinate (TOPROL-XL) 50 MG 24 hr tablet Take 1 tablet (50 mg total) by mouth daily. 06/10/14   Liliane Shi, PA-C  nitroGLYCERIN (NITROSTAT) 0.4 MG SL tablet Place 1 tablet (0.4 mg total) under the tongue every 5 (five) minutes as needed for chest pain. 05/14/14   Almyra Deforest, PA  ramipril (ALTACE) 5 MG capsule TAKE 1 CAPSULE DAILY. Patient taking differently: Take 5 mg by mouth daily.  07/31/13   [name], MD  rosuvastatin (CRESTOR) 10 MG tablet Take 1 tablet (10 mg total) by mouth every Monday, Wednesday, and Friday. 06/10/14   Liliane Shi, PA-C  Soft Lens Products (AQUIFY MULTI-PURPOSE) SOLN Place 1 drop into both eyes daily as needed (dry eyes).    Historical Provider, MD  tamsulosin (FLOMAX) 0.4 MG CAPS capsule Take 1 capsule (0.4 mg total) by mouth daily. 02/18/14   Elby Beck, FNP  ticagrelor (BRILINTA) 90 MG TABS tablet Take 1 tablet (90 mg total) by mouth 2 (two) times daily. 05/14/14   Almyra Deforest, PA   BP 153/66 mmHg  Pulse 59  Temp(Src) 97.9 F (36.6 C) (Oral)  Resp 16  Ht 5\' 8"  (1.727 m)  Wt 190 lb (86.183 kg)  BMI 28.90 kg/m2  SpO2 100% Physical Exam  Constitutional: He is oriented to person, place, and time. He appears  well-developed and well-nourished. No distress.  HENT:  Head: Normocephalic and atraumatic.  Eyes: EOM are normal. Pupils are equal, round, and reactive to light.  Neck: Normal range of motion. Neck supple. No JVD present.  Cardiovascular: Normal rate, regular rhythm and normal heart sounds.  Exam reveals no gallop and no friction rub.   No murmur heard. Pulmonary/Chest: Effort normal and breath sounds normal. No respiratory distress. He has no wheezes. He has no rales.  Abdominal: Soft. Bowel sounds are normal. He exhibits no mass. There is no tenderness. There is no rebound and no guarding.  Musculoskeletal: Normal range of motion. He exhibits no edema.  Moves all extremities normally.   Neurological: He is alert and oriented  to person, place, and time. He displays normal reflexes.  Skin: Skin is warm and dry. No rash noted.  Psychiatric: He has a normal mood and affect. His behavior is normal.  Nursing note and vitals reviewed.   ED Course  Procedures   DIAGNOSTIC STUDIES: Oxygen Saturation is 99% on RA, normal by my interpretation.    COORDINATION OF CARE: 11:45 PM Discussed treatment plan with pt at bedside and pt agreed to plan.  2:18 AM Discussed results with patient and new plan for admission. Patient verbalized understanding and agreed to plan. He states he feels well at this time; denies any pain.    Labs Review Labs Reviewed  BASIC METABOLIC PANEL - Abnormal; Notable for the following:    Glucose, Bld 141 (*)    All other components within normal limits  BRAIN NATRIURETIC PEPTIDE - Abnormal; Notable for the following:    B Natriuretic Peptide 152.4 (*)    All other components within normal limits  CBC  TROPONIN I  Randolm Idol, ED    Imaging Review Dg Chest 2 View  06/17/2014   CLINICAL DATA:  Acute onset of central chest pain. Initial encounter.  EXAM: CHEST  2 VIEW  COMPARISON:  Chest radiograph performed 11/22/2007  FINDINGS: The lungs are well-aerated  and clear. There is no evidence of focal opacification, pleural effusion or pneumothorax.  The heart is borderline normal in size. No acute osseous abnormalities are seen.  IMPRESSION: No acute cardiopulmonary process seen.   Electronically Signed   By: Garald Balding M.D.   On: 06/17/2014 20:26     EKG Interpretation   Date/Time:  Wednesday June 17 2014 19:39:05 EDT Ventricular Rate:  67 PR Interval:  168 QRS Duration: 104 QT Interval:  388 QTC Calculation: 409 R Axis:   94 Text Interpretation:  Sinus rhythm with Premature supraventricular  complexes Rightward axis Nonspecific ST abnormality Abnormal ECG Confirmed  by Deshonda Cryderman  MD, Andreia Gandolfi ([id number]) on 06/17/2014 11:08:04 PM      EKG Interpretation  Date/Time:  Wednesday June 17 2014 23:47:23 EDT Ventricular Rate:  58 PR Interval:  170 QRS Duration: 98 QT Interval:  446 QTC Calculation: 438 R Axis:   90 Text Interpretation:  Sinus rhythm Atrial premature complex Inferior infarct, age indeterminate lateral t wave inversions have resolved Confirmed by Lateia Fraser  MD, Isiac Breighner ([id number]) on 06/18/2014 12:24:06 AM      CRITICAL CARE Performed by: Kalman Drape Total critical care time: 30 min Critical care time was exclusive of separately billable procedures and treating other patients. Critical care was necessary to treat or prevent imminent or life-threatening deterioration. Critical care was time spent personally by me on the following activities: development of treatment plan with patient and/or surrogate as well as nursing, discussions with consultants, evaluation of patient's response to treatment, examination of patient, obtaining history from patient or surrogate, ordering and performing treatments and interventions, ordering and review of laboratory studies, ordering and review of radiographic studies, pulse oximetry and re-evaluation of patient's condition.   MDM   Final diagnoses:  NSTEMI (non-ST elevated myocardial infarction)    I  personally performed the services described in this documentation, which was scribed in my presence. The recorded information has been reviewed and is accurate.  69 year old male with history of heart catheterization May 11 with 2 stents in RCA.  Patient had 3 hours of chest soreness today.  No relief with nitroglycerin.  No pain at present.  Initial troponin and EKG unremarkable.  Plan for  delta troponin, repeat EKG.   2:22 AM Significantly elevated troponin noted.  Case discussed with Dr. Claiborne Billings, on-call cardiology fellow.  Will start patient on heparin.  Patient updated on findings and plan.  Plan for admission.  [name] Flemings, MD 06/18/14 [phone]

## 2014-06-17 NOTE — ED Notes (Signed)
Pt advised hasn't taken BP medication today.

## 2014-06-18 ENCOUNTER — Encounter (HOSPITAL_COMMUNITY)
Admission: EM | Disposition: A | Discharge status: Home / Self Care | Payer: Medicare Other | Source: Home / Self Care | Attending: Thoracic Surgery (Cardiothoracic Vascular Surgery)

## 2014-06-18 ENCOUNTER — Encounter (HOSPITAL_COMMUNITY): Payer: Self-pay | Admitting: Cardiology

## 2014-06-18 ENCOUNTER — Other Ambulatory Visit: Payer: Self-pay

## 2014-06-18 DIAGNOSIS — I10 Essential (primary) hypertension: Secondary | ICD-10-CM | POA: Diagnosis not present

## 2014-06-18 DIAGNOSIS — I25111 Atherosclerotic heart disease of native coronary artery with angina pectoris with documented spasm: Secondary | ICD-10-CM

## 2014-06-18 DIAGNOSIS — Z951 Presence of aortocoronary bypass graft: Secondary | ICD-10-CM | POA: Diagnosis not present

## 2014-06-18 DIAGNOSIS — Z825 Family history of asthma and other chronic lower respiratory diseases: Secondary | ICD-10-CM | POA: Diagnosis not present

## 2014-06-18 DIAGNOSIS — D62 Acute posthemorrhagic anemia: Secondary | ICD-10-CM | POA: Diagnosis not present

## 2014-06-18 DIAGNOSIS — Z79899 Other long term (current) drug therapy: Secondary | ICD-10-CM | POA: Diagnosis not present

## 2014-06-18 DIAGNOSIS — I252 Old myocardial infarction: Secondary | ICD-10-CM | POA: Diagnosis not present

## 2014-06-18 DIAGNOSIS — R12 Heartburn: Secondary | ICD-10-CM | POA: Diagnosis present

## 2014-06-18 DIAGNOSIS — E785 Hyperlipidemia, unspecified: Secondary | ICD-10-CM | POA: Diagnosis not present

## 2014-06-18 DIAGNOSIS — I25110 Atherosclerotic heart disease of native coronary artery with unstable angina pectoris: Secondary | ICD-10-CM | POA: Diagnosis not present

## 2014-06-18 DIAGNOSIS — Z7902 Long term (current) use of antithrombotics/antiplatelets: Secondary | ICD-10-CM | POA: Diagnosis not present

## 2014-06-18 DIAGNOSIS — K589 Irritable bowel syndrome without diarrhea: Secondary | ICD-10-CM | POA: Diagnosis present

## 2014-06-18 DIAGNOSIS — I214 Non-ST elevation (NSTEMI) myocardial infarction: Secondary | ICD-10-CM | POA: Diagnosis not present

## 2014-06-18 DIAGNOSIS — Z8249 Family history of ischemic heart disease and other diseases of the circulatory system: Secondary | ICD-10-CM | POA: Diagnosis not present

## 2014-06-18 DIAGNOSIS — Z48812 Encounter for surgical aftercare following surgery on the circulatory system: Secondary | ICD-10-CM | POA: Diagnosis not present

## 2014-06-18 DIAGNOSIS — I472 Ventricular tachycardia: Secondary | ICD-10-CM | POA: Diagnosis not present

## 2014-06-18 DIAGNOSIS — E8770 Fluid overload, unspecified: Secondary | ICD-10-CM | POA: Diagnosis not present

## 2014-06-18 DIAGNOSIS — J9811 Atelectasis: Secondary | ICD-10-CM | POA: Diagnosis not present

## 2014-06-18 DIAGNOSIS — I4891 Unspecified atrial fibrillation: Secondary | ICD-10-CM | POA: Diagnosis not present

## 2014-06-18 DIAGNOSIS — I2510 Atherosclerotic heart disease of native coronary artery without angina pectoris: Secondary | ICD-10-CM

## 2014-06-18 DIAGNOSIS — R05 Cough: Secondary | ICD-10-CM | POA: Diagnosis not present

## 2014-06-18 DIAGNOSIS — Z452 Encounter for adjustment and management of vascular access device: Secondary | ICD-10-CM | POA: Diagnosis not present

## 2014-06-18 DIAGNOSIS — M1990 Unspecified osteoarthritis, unspecified site: Secondary | ICD-10-CM | POA: Diagnosis not present

## 2014-06-18 DIAGNOSIS — E11649 Type 2 diabetes mellitus with hypoglycemia without coma: Secondary | ICD-10-CM | POA: Diagnosis present

## 2014-06-18 DIAGNOSIS — R070 Pain in throat: Secondary | ICD-10-CM | POA: Diagnosis not present

## 2014-06-18 DIAGNOSIS — R079 Chest pain, unspecified: Secondary | ICD-10-CM | POA: Diagnosis present

## 2014-06-18 DIAGNOSIS — T82857A Stenosis of cardiac prosthetic devices, implants and grafts, initial encounter: Secondary | ICD-10-CM | POA: Diagnosis not present

## 2014-06-18 DIAGNOSIS — I480 Paroxysmal atrial fibrillation: Secondary | ICD-10-CM | POA: Diagnosis not present

## 2014-06-18 DIAGNOSIS — R001 Bradycardia, unspecified: Secondary | ICD-10-CM | POA: Diagnosis not present

## 2014-06-18 DIAGNOSIS — Z820 Family history of epilepsy and other diseases of the nervous system: Secondary | ICD-10-CM | POA: Diagnosis not present

## 2014-06-18 DIAGNOSIS — I255 Ischemic cardiomyopathy: Secondary | ICD-10-CM | POA: Diagnosis not present

## 2014-06-18 DIAGNOSIS — D696 Thrombocytopenia, unspecified: Secondary | ICD-10-CM | POA: Diagnosis not present

## 2014-06-18 DIAGNOSIS — I2584 Coronary atherosclerosis due to calcified coronary lesion: Secondary | ICD-10-CM | POA: Diagnosis present

## 2014-06-18 HISTORY — PX: CARDIAC CATHETERIZATION: SHX172

## 2014-06-18 LAB — BASIC METABOLIC PANEL
Anion gap: 8 (ref 5–15)
BUN: 11 mg/dL (ref 6–20)
CHLORIDE: 109 mmol/L (ref 101–111)
CO2: 23 mmol/L (ref 22–32)
Calcium: 9.2 mg/dL (ref 8.9–10.3)
Creatinine, Ser: 0.77 mg/dL (ref 0.61–1.24)
GFR calc Af Amer: >60 mL/min (ref >60)
GFR calc non Af Amer: >60 mL/min (ref >60)
GLUCOSE: 152 mg/dL — AB (ref 65–99)
POTASSIUM: 3.7 mmol/L (ref 3.5–5.1)
SODIUM: 140 mmol/L (ref 135–145)

## 2014-06-18 LAB — PROTIME-INR
INR: 1.18 (ref 0.00–1.49)
PROTHROMBIN TIME: 15.1 s (ref 11.6–15.2)

## 2014-06-18 LAB — LIPID PANEL
Cholesterol: 160 mg/dL (ref 0–200)
HDL: 39 mg/dL — AB (ref >40)
LDL CALC: 97 mg/dL (ref 0–99)
Total CHOL/HDL Ratio: 4.1 RATIO
Triglycerides: 121 mg/dL (ref <150)
VLDL: 24 mg/dL (ref 0–40)

## 2014-06-18 LAB — CBC
HCT: 40.5 % (ref 39.0–52.0)
Hemoglobin: 13.9 g/dL (ref 13.0–17.0)
MCH: 30.3 pg (ref 26.0–34.0)
MCHC: 34.3 g/dL (ref 30.0–36.0)
MCV: 88.2 fL (ref 78.0–100.0)
PLATELETS: 201 10*3/uL (ref 150–400)
RBC: 4.59 MIL/uL (ref 4.22–5.81)
RDW: 13.1 % (ref 11.5–15.5)
WBC: 6.7 10*3/uL (ref 4.0–10.5)

## 2014-06-18 LAB — GLUCOSE, CAPILLARY
GLUCOSE-CAPILLARY: 174 mg/dL — AB (ref 65–99)
Glucose-Capillary: 171 mg/dL — ABNORMAL HIGH (ref 65–99)
Glucose-Capillary: 153 mg/dL — ABNORMAL HIGH (ref 65–99)
Glucose-Capillary: 249 mg/dL — ABNORMAL HIGH (ref 65–99)

## 2014-06-18 LAB — TROPONIN I
TROPONIN I: 11.28 ng/mL — AB (ref <0.031)
TROPONIN I: 5.16 ng/mL — AB (ref <0.031)
Troponin I: 10.17 ng/mL (ref <0.031)
Troponin I: 8.89 ng/mL (ref <0.031)

## 2014-06-18 LAB — TSH: TSH: 0.748 u[IU]/mL (ref 0.350–4.500)

## 2014-06-18 LAB — POCT ACTIVATED CLOTTING TIME: ACTIVATED CLOTTING TIME: 257 s

## 2014-06-18 LAB — HEPARIN LEVEL (UNFRACTIONATED): Heparin Unfractionated: 0.33 IU/mL (ref 0.30–0.70)

## 2014-06-18 LAB — MAGNESIUM: MAGNESIUM: 2 mg/dL (ref 1.7–2.4)

## 2014-06-18 LAB — BRAIN NATRIURETIC PEPTIDE: B Natriuretic Peptide: 246.3 pg/mL — ABNORMAL HIGH (ref 0.0–100.0)

## 2014-06-18 SURGERY — LEFT HEART CATH AND CORONARY ANGIOGRAPHY
Anesthesia: LOCAL

## 2014-06-18 MED ORDER — MIDAZOLAM HCL 2 MG/2ML IJ SOLN
INTRAMUSCULAR | Status: AC
  Filled 2014-06-18: qty 2

## 2014-06-18 MED ORDER — ASPIRIN 81 MG PO TABS: 81.0000 mg | ORAL_TABLET | Freq: Every day | ORAL | Status: DC

## 2014-06-18 MED ORDER — BESIFLOXACIN HCL 0.6 % OP SUSP
1.0000 [drp] | Freq: Three times a day (TID) | OPHTHALMIC | Status: DC
  Administered 2014-06-18 – 2014-06-27 (×19): 1 [drp] via OPHTHALMIC

## 2014-06-18 MED ORDER — IOHEXOL 350 MG/ML SOLN
INTRAVENOUS | Status: DC | PRN
  Administered 2014-06-18: 125 mL via INTRAVENOUS

## 2014-06-18 MED ORDER — HEPARIN SODIUM (PORCINE) 1000 UNIT/ML IJ SOLN
INTRAMUSCULAR | Status: AC
  Filled 2014-06-18: qty 1

## 2014-06-18 MED ORDER — SODIUM CHLORIDE 0.9 % IJ SOLN
3.0000 mL | Freq: Two times a day (BID) | INTRAMUSCULAR | Status: DC
  Administered 2014-06-19 – 2014-06-21 (×5): 3 mL via INTRAVENOUS

## 2014-06-18 MED ORDER — HEPARIN (PORCINE) IN NACL 100-0.45 UNIT/ML-% IJ SOLN
1500.0000 [IU]/h | INTRAMUSCULAR | Status: DC
  Administered 2014-06-18: 1400 [IU]/h via INTRAVENOUS
  Administered 2014-06-19: 1500 [IU]/h via INTRAVENOUS
  Filled 2014-06-18 (×2): qty 250

## 2014-06-18 MED ORDER — ADENOSINE (DIAGNOSTIC) 140MCG/KG/MIN
INTRAVENOUS | Status: DC | PRN
  Administered 2014-06-18: 140 ug/kg/min via INTRAVENOUS

## 2014-06-18 MED ORDER — SODIUM CHLORIDE 0.9 % IV SOLN: 250.0000 mL | INTRAVENOUS | Status: DC | PRN

## 2014-06-18 MED ORDER — SODIUM CHLORIDE 0.9 % IJ SOLN: 3.0000 mL | INTRAMUSCULAR | Status: DC | PRN

## 2014-06-18 MED ORDER — SODIUM CHLORIDE 0.9 % IJ SOLN
INTRAMUSCULAR | Status: DC | PRN
  Administered 2014-06-18: 11:00:00 via INTRA_ARTERIAL

## 2014-06-18 MED ORDER — NITROGLYCERIN 0.4 MG SL SUBL: 0.4000 mg | SUBLINGUAL_TABLET | SUBLINGUAL | Status: DC | PRN

## 2014-06-18 MED ORDER — RAMIPRIL 5 MG PO CAPS
5.0000 mg | ORAL_CAPSULE | Freq: Every day | ORAL | Status: DC
  Administered 2014-06-18 – 2014-06-21 (×4): 5 mg via ORAL
  Filled 2014-06-18 (×4): qty 1

## 2014-06-18 MED ORDER — ALPRAZOLAM 0.25 MG PO TABS: 0.2500 mg | ORAL_TABLET | ORAL | Status: DC | PRN

## 2014-06-18 MED ORDER — SODIUM CHLORIDE 0.9 % WEIGHT BASED INFUSION: 1.0000 mL/kg/h | INTRAVENOUS | Status: DC

## 2014-06-18 MED ORDER — ONDANSETRON HCL 4 MG/2ML IJ SOLN: 4.0000 mg | Freq: Four times a day (QID) | INTRAMUSCULAR | Status: DC | PRN

## 2014-06-18 MED ORDER — ASPIRIN EC 81 MG PO TBEC
81.0000 mg | DELAYED_RELEASE_TABLET | Freq: Every day | ORAL | Status: DC
  Administered 2014-06-19 – 2014-06-21 (×3): 81 mg via ORAL
  Filled 2014-06-18 (×3): qty 1

## 2014-06-18 MED ORDER — MIDAZOLAM HCL 2 MG/2ML IJ SOLN
INTRAMUSCULAR | Status: DC | PRN
  Administered 2014-06-18: 2 mg via INTRAVENOUS

## 2014-06-18 MED ORDER — NEPAFENAC 0.3 % OP SUSP
1.0000 [drp] | Freq: Every day | OPHTHALMIC | Status: DC
Start: 2014-06-18 — End: 2014-06-27
  Administered 2014-06-18 – 2014-06-26 (×6): 1 [drp] via OPHTHALMIC

## 2014-06-18 MED ORDER — DIFLUPREDNATE 0.05 % OP EMUL
1.0000 [drp] | Freq: Four times a day (QID) | OPHTHALMIC | Status: DC | PRN
  Administered 2014-06-18: 1 [drp] via OPHTHALMIC

## 2014-06-18 MED ORDER — CILIDINIUM-CHLORDIAZEPOXIDE 2.5-5 MG PO CAPS
1.0000 | ORAL_CAPSULE | Freq: Three times a day (TID) | ORAL | Status: DC
  Administered 2014-06-18 – 2014-06-21 (×9): 1 via ORAL
  Filled 2014-06-18 (×17): qty 1

## 2014-06-18 MED ORDER — HEPARIN BOLUS VIA INFUSION
4000.0000 [IU] | Freq: Once | INTRAVENOUS | Status: AC
  Administered 2014-06-18: 4000 [IU] via INTRAVENOUS
  Filled 2014-06-18: qty 4000

## 2014-06-18 MED ORDER — TICAGRELOR 90 MG PO TABS
ORAL_TABLET | ORAL | Status: DC | PRN
  Administered 2014-06-18: 90 mg via ORAL

## 2014-06-18 MED ORDER — GLYBURIDE 5 MG PO TABS
5.0000 mg | ORAL_TABLET | Freq: Two times a day (BID) | ORAL | Status: DC
  Administered 2014-06-19 – 2014-06-21 (×5): 5 mg via ORAL
  Filled 2014-06-18 (×9): qty 1

## 2014-06-18 MED ORDER — ROSUVASTATIN CALCIUM 10 MG PO TABS
10.0000 mg | ORAL_TABLET | ORAL | Status: DC
  Administered 2014-06-19 – 2014-06-24 (×2): 10 mg via ORAL
  Filled 2014-06-18 (×3): qty 1

## 2014-06-18 MED ORDER — ADENOSINE 12 MG/4ML IV SOLN
16.0000 mL | Freq: Once | INTRAVENOUS | Status: DC
  Filled 2014-06-18: qty 16

## 2014-06-18 MED ORDER — INSULIN ASPART 100 UNIT/ML ~~LOC~~ SOLN
0.0000 [IU] | Freq: Every day | SUBCUTANEOUS | Status: DC
  Administered 2014-06-19: 3 [IU] via SUBCUTANEOUS
  Administered 2014-06-20: 2 [IU] via SUBCUTANEOUS

## 2014-06-18 MED ORDER — VERAPAMIL HCL 2.5 MG/ML IV SOLN
INTRAVENOUS | Status: AC
  Filled 2014-06-18: qty 2

## 2014-06-18 MED ORDER — FENTANYL CITRATE (PF) 100 MCG/2ML IJ SOLN
INTRAMUSCULAR | Status: AC
  Filled 2014-06-18: qty 2

## 2014-06-18 MED ORDER — ASPIRIN 81 MG PO CHEW
CHEWABLE_TABLET | ORAL | Status: DC | PRN
  Administered 2014-06-18: 81 mg via ORAL

## 2014-06-18 MED ORDER — SODIUM CHLORIDE 0.9 % IJ SOLN: 3.0000 mL | Freq: Two times a day (BID) | INTRAMUSCULAR | Status: DC

## 2014-06-18 MED ORDER — ACETAMINOPHEN 325 MG PO TABS: 650.0000 mg | ORAL_TABLET | ORAL | Status: DC | PRN

## 2014-06-18 MED ORDER — METOPROLOL SUCCINATE ER 50 MG PO TB24
50.0000 mg | ORAL_TABLET | Freq: Every day | ORAL | Status: DC
  Administered 2014-06-18 – 2014-06-21 (×4): 50 mg via ORAL
  Filled 2014-06-18 (×4): qty 1

## 2014-06-18 MED ORDER — FENTANYL CITRATE (PF) 100 MCG/2ML IJ SOLN
INTRAMUSCULAR | Status: DC | PRN
  Administered 2014-06-18: 25 ug via INTRAVENOUS

## 2014-06-18 MED ORDER — SODIUM CHLORIDE 0.9 % WEIGHT BASED INFUSION
1.0000 mL/kg/h | INTRAVENOUS | Status: AC
  Administered 2014-06-18: 1 mL/kg/h via INTRAVENOUS

## 2014-06-18 MED ORDER — TICAGRELOR 90 MG PO TABS: 90.0000 mg | ORAL_TABLET | Freq: Two times a day (BID) | ORAL | Status: DC

## 2014-06-18 MED ORDER — FLUTICASONE PROPIONATE 50 MCG/ACT NA SUSP: 1.0000 | Freq: Every day | NASAL | Status: DC | PRN

## 2014-06-18 MED ORDER — TICAGRELOR 90 MG PO TABS
ORAL_TABLET | ORAL | Status: AC
  Filled 2014-06-18: qty 1

## 2014-06-18 MED ORDER — HEPARIN SODIUM (PORCINE) 1000 UNIT/ML IJ SOLN
INTRAMUSCULAR | Status: DC | PRN
  Administered 2014-06-18: 4500 [IU] via INTRAVENOUS
  Administered 2014-06-18: 3000 [IU] via INTRAVENOUS

## 2014-06-18 MED ORDER — INSULIN ASPART 100 UNIT/ML ~~LOC~~ SOLN
0.0000 [IU] | Freq: Three times a day (TID) | SUBCUTANEOUS | Status: DC
  Administered 2014-06-19: 5 [IU] via SUBCUTANEOUS
  Administered 2014-06-19: 3 [IU] via SUBCUTANEOUS
  Administered 2014-06-19 – 2014-06-20 (×2): 5 [IU] via SUBCUTANEOUS
  Administered 2014-06-20: 2 [IU] via SUBCUTANEOUS
  Administered 2014-06-20 – 2014-06-21 (×3): 3 [IU] via SUBCUTANEOUS
  Administered 2014-06-21: 2 [IU] via SUBCUTANEOUS

## 2014-06-18 MED ORDER — HEPARIN (PORCINE) IN NACL 2-0.9 UNIT/ML-% IJ SOLN
INTRAMUSCULAR | Status: AC
  Filled 2014-06-18: qty 1000

## 2014-06-18 MED ORDER — ASPIRIN 81 MG PO CHEW
CHEWABLE_TABLET | ORAL | Status: AC
  Filled 2014-06-18: qty 1

## 2014-06-18 MED ORDER — NITROGLYCERIN 1 MG/10 ML FOR IR/CATH LAB
INTRA_ARTERIAL | Status: AC
  Filled 2014-06-18: qty 10

## 2014-06-18 MED ORDER — HEPARIN (PORCINE) IN NACL 100-0.45 UNIT/ML-% IJ SOLN
1400.0000 [IU]/h | INTRAMUSCULAR | Status: DC
  Administered 2014-06-18: 1400 [IU]/h via INTRAVENOUS
  Filled 2014-06-18: qty 250

## 2014-06-18 MED ORDER — SODIUM CHLORIDE 0.9 % WEIGHT BASED INFUSION
3.0000 mL/kg/h | INTRAVENOUS | Status: DC
  Administered 2014-06-18: 3 mL/kg/h via INTRAVENOUS

## 2014-06-18 MED ORDER — TAMSULOSIN HCL 0.4 MG PO CAPS
0.4000 mg | ORAL_CAPSULE | Freq: Every day | ORAL | Status: DC | PRN
  Filled 2014-06-18: qty 1

## 2014-06-18 MED ORDER — ASPIRIN 81 MG PO CHEW: 81.0000 mg | CHEWABLE_TABLET | ORAL | Status: DC

## 2014-06-18 MED ORDER — LIDOCAINE HCL (PF) 1 % IJ SOLN
INTRAMUSCULAR | Status: AC
  Filled 2014-06-18: qty 30

## 2014-06-18 SURGICAL SUPPLY — 18 items
CATH INFINITI 5 FR JL3.5 (CATHETERS) ×2 IMPLANT
CATH INFINITI 5FR ANG PIGTAIL (CATHETERS) ×2 IMPLANT
CATH INFINITI 5FR MULTPACK ANG (CATHETERS) IMPLANT
CATH INFINITI JR4 5F (CATHETERS) ×2 IMPLANT
CATH MICROCATH NAVVUS (MICROCATHETER) ×1 IMPLANT
CATH VISTA GUIDE 6FR XBLAD3.5 (CATHETERS) ×2 IMPLANT
DEVICE RAD COMP TR BAND LRG (VASCULAR PRODUCTS) ×2 IMPLANT
GLIDESHEATH SLEND SS 6F .021 (SHEATH) ×2 IMPLANT
KIT HEART LEFT (KITS) ×2 IMPLANT
MICROCATHETER NAVVUS (MICROCATHETER) ×2
PACK CARDIAC CATHETERIZATION (CUSTOM PROCEDURE TRAY) ×2 IMPLANT
SHEATH PINNACLE 5F 10CM (SHEATH) IMPLANT
SYR MEDRAD MARK V 150ML (SYRINGE) ×2 IMPLANT
TRANSDUCER W/STOPCOCK (MISCELLANEOUS) ×2 IMPLANT
TUBING CIL FLEX 10 FLL-RA (TUBING) ×2 IMPLANT
WIRE ASAHI PROWATER 180CM (WIRE) ×2 IMPLANT
WIRE EMERALD 3MM-J .035X150CM (WIRE) IMPLANT
WIRE SAFE-T 1.5MM-J .035X260CM (WIRE) ×2 IMPLANT

## 2014-06-18 NOTE — ED Notes (Signed)
Troponin result reported to Dr Sharol Given

## 2014-06-18 NOTE — Interval H&P Note (Signed)
History and Physical Interval Note:  06/18/2014 10:46 AM  [name]  has presented today for surgery, with the diagnosis of cp  The various methods of treatment have been discussed with the patient and family. After consideration of risks, benefits and other options for treatment, the patient has consented to  Procedure(s): Left Heart Cath and Coronary Angiography (N/A) as a surgical intervention .  The patient's history has been reviewed, patient examined, no change in status, stable for surgery.  I have reviewed the patient's chart and labs.  Questions were answered to the patient's satisfaction.    Cath Lab Visit (complete for each Cath Lab visit)  Clinical Evaluation Leading to the Procedure:   ACS: Yes.    Non-ACS:    Anginal Classification: CCS III  Anti-ischemic medical therapy: Minimal Therapy (1 class of medications)  Non-Invasive Test Results: No non-invasive testing performed  Prior CABG: No previous CABG       [name] 06/18/2014 10:46 AM

## 2014-06-18 NOTE — H&P (View-Only) (Signed)
Subjective:  Currently not having CP. NSTEMI on IV hep  Objective:  Temp:  [97.6 F (36.4 C)-97.9 F (36.6 C)] 97.6 F (36.4 C) (06/16 0436) Pulse Rate:  [53-68] 63 (06/16 0436) Resp:  [11-24] 13 (06/16 0436) BP: (147-184)/(66-86) 147/74 mmHg (06/16 0436) SpO2:  [98 %-100 %] 99 % (06/16 0436) Weight:  [190 lb (86.183 kg)-192 lb (87.091 kg)] 192 lb (87.091 kg) (06/16 0420) Weight change:   Intake/Output from previous day:    Intake/Output from this shift: Total I/O In: 84.5 [I.V.:84.5] Out: -   Physical Exam: General appearance: alert and no distress Neck: no adenopathy, no carotid bruit, no JVD, supple, symmetrical, trachea midline and thyroid not enlarged, symmetric, no tenderness/mass/nodules Lungs: clear to auscultation bilaterally Heart: regular rate and rhythm, S1, S2 normal, no murmur, click, rub or gallop Extremities: extremities normal, atraumatic, no cyanosis or edema  Lab Results: Results for orders placed or performed during the hospital encounter of 06/17/14 (from the past 48 hour(s))  CBC     Status: None   Collection Time: 06/17/14  7:52 PM  Result Value Ref Range   WBC 5.2 4.0 - 10.5 K/uL   RBC 4.61 4.22 - 5.81 MIL/uL   Hemoglobin 13.9 13.0 - 17.0 g/dL   HCT 41.4 39.0 - 52.0 %   MCV 89.8 78.0 - 100.0 fL   MCH 30.2 26.0 - 34.0 pg   MCHC 33.6 30.0 - 36.0 g/dL   RDW 13.0 11.5 - 15.5 %   Platelets 204 150 - 400 K/uL  Basic metabolic panel     Status: Abnormal   Collection Time: 06/17/14  7:52 PM  Result Value Ref Range   Sodium 139 135 - 145 mmol/L   Potassium 4.6 3.5 - 5.1 mmol/L   Chloride 106 101 - 111 mmol/L   CO2 25 22 - 32 mmol/L   Glucose, Bld 141 (H) 65 - 99 mg/dL   BUN 14 6 - 20 mg/dL   Creatinine, Ser 0.85 0.61 - 1.24 mg/dL   Calcium 9.7 8.9 - 10.3 mg/dL   GFR calc non Af Amer >60 >60 mL/min   GFR calc Af Amer >60 >60 mL/min    Comment: (NOTE) The eGFR has been calculated using the CKD EPI equation. This calculation has not been  validated in all clinical situations. eGFR's persistently <60 mL/min signify possible Chronic Kidney Disease.    Anion gap 8 5 - 15  BNP (order ONLY if patient complains of dyspnea/SOB AND you have documented it for THIS visit)     Status: Abnormal   Collection Time: 06/17/14  7:52 PM  Result Value Ref Range   B Natriuretic Peptide 152.4 (H) 0.0 - 100.0 pg/mL  I-stat troponin, ED  (not at [facility], [facility])     Status: None   Collection Time: 06/17/14  8:03 PM  Result Value Ref Range   Troponin i, poc 0.00 0.00 - 0.08 ng/mL   Comment 3            Comment: Due to the release kinetics of cTnI, a negative result within the first hours of the onset of symptoms does not rule out myocardial infarction with certainty. If myocardial infarction is still suspected, repeat the test at appropriate intervals.   Troponin I     Status: Abnormal   Collection Time: 06/18/14 12:37 AM  Result Value Ref Range   Troponin I 5.16 (HH) <0.031 ng/mL    Comment:        POSSIBLE  MYOCARDIAL ISCHEMIA. SERIAL TESTING RECOMMENDED. CRITICAL RESULT CALLED TO, READ BACK BY AND VERIFIED WITH: HUGHES,C RN 06/18/2014 0154 JORDANS REPEATED TO VERIFY   TSH     Status: None   Collection Time: 06/18/14  4:57 AM  Result Value Ref Range   TSH 0.748 0.350 - 4.500 uIU/mL  Magnesium     Status: None   Collection Time: 06/18/14  4:57 AM  Result Value Ref Range   Magnesium 2.0 1.7 - 2.4 mg/dL  Troponin I     Status: Abnormal   Collection Time: 06/18/14  4:57 AM  Result Value Ref Range   Troponin I 10.17 (HH) <0.031 ng/mL    Comment:        POSSIBLE MYOCARDIAL ISCHEMIA. SERIAL TESTING RECOMMENDED. CRITICAL VALUE NOTED.  VALUE IS CONSISTENT WITH PREVIOUSLY REPORTED AND CALLED VALUE. REPEATED TO VERIFY   Brain natriuretic peptide     Status: Abnormal   Collection Time: 06/18/14  4:57 AM  Result Value Ref Range   B Natriuretic Peptide 246.3 (H) 0.0 - 100.0 pg/mL  Basic metabolic panel     Status: Abnormal   Collection  Time: 06/18/14  4:57 AM  Result Value Ref Range   Sodium 140 135 - 145 mmol/L   Potassium 3.7 3.5 - 5.1 mmol/L    Comment: DELTA CHECK NOTED   Chloride 109 101 - 111 mmol/L   CO2 23 22 - 32 mmol/L   Glucose, Bld 152 (H) 65 - 99 mg/dL   BUN 11 6 - 20 mg/dL   Creatinine, Ser 0.77 0.61 - 1.24 mg/dL   Calcium 9.2 8.9 - 10.3 mg/dL   GFR calc non Af Amer >60 >60 mL/min   GFR calc Af Amer >60 >60 mL/min    Comment: (NOTE) The eGFR has been calculated using the CKD EPI equation. This calculation has not been validated in all clinical situations. eGFR's persistently <60 mL/min signify possible Chronic Kidney Disease.    Anion gap 8 5 - 15  Lipid panel     Status: Abnormal   Collection Time: 06/18/14  4:57 AM  Result Value Ref Range   Cholesterol 160 0 - 200 mg/dL   Triglycerides 121 <150 mg/dL   HDL 39 (L) >40 mg/dL   Total CHOL/HDL Ratio 4.1 RATIO   VLDL 24 0 - 40 mg/dL   LDL Cholesterol 97 0 - 99 mg/dL    Comment:        Total Cholesterol/HDL:CHD Risk Coronary Heart Disease Risk Table                     Men   Women  1/2 Average Risk   3.4   3.3  Average Risk       5.0   4.4  2 X Average Risk   9.6   7.1  3 X Average Risk  23.4   11.0        Use the calculated Patient Ratio above and the CHD Risk Table to determine the patient's CHD Risk.        ATP III CLASSIFICATION (LDL):  <100     mg/dL   Optimal  100-129  mg/dL   Near or Above                    Optimal  130-159  mg/dL   Borderline  160-189  mg/dL   High  >190     mg/dL   Very High   CBC  Status: None   Collection Time: 06/18/14  4:57 AM  Result Value Ref Range   WBC 6.7 4.0 - 10.5 K/uL   RBC 4.59 4.22 - 5.81 MIL/uL   Hemoglobin 13.9 13.0 - 17.0 g/dL   HCT 40.5 39.0 - 52.0 %   MCV 88.2 78.0 - 100.0 fL   MCH 30.3 26.0 - 34.0 pg   MCHC 34.3 30.0 - 36.0 g/dL   RDW 13.1 11.5 - 15.5 %   Platelets 201 150 - 400 K/uL  Protime-INR     Status: None   Collection Time: 06/18/14  4:57 AM  Result Value Ref Range    Prothrombin Time 15.1 11.6 - 15.2 seconds   INR 1.18 0.00 - 1.49  Glucose, capillary     Status: Abnormal   Collection Time: 06/18/14  7:46 AM  Result Value Ref Range   Glucose-Capillary 171 (H) 65 - 99 mg/dL    Imaging: Imaging results have been reviewed  Assessment/Plan:   1. Principal Problem: 2.   NSTEMI (non-ST elevated myocardial infarction) 3. Active Problems: 4.   DM type 2 (diabetes mellitus, type 2) 5.   ATRIAL FIBRILLATION, PAROXYSMAL 6.   Hypertension 7.   Hyperlipidemia 8.   Time Spent Directly with Patient:  20 minutes  Length of Stay:  LOS: 0 days   S/P HSRA/ PCI-Stent RCA X2 one month ago on DAPT with Brilenta. Recurrent CP last PM, currently pain free on IV hep with rising Trop 5---->>10. Exam benign. Apparently had difficulty with Right radial access by Dr. Leda Gauze last admission and went right femoral. Will need cath this Am. Have notified Cath lab. Dr. Shirlee More to perform.   [name] 06/18/2014, 10:11 AM

## 2014-06-18 NOTE — ED Notes (Signed)
Dr. Claiborne Billings, cardiology, at the bedside.

## 2014-06-18 NOTE — Progress Notes (Signed)
Utilization review complete. Akili Cuda RN CCM Case Mgmt phone [phone] 

## 2014-06-18 NOTE — Progress Notes (Signed)
ANTICOAGULATION CONSULT NOTE - Initial Consult  Pharmacy Consult for Heparin Indication: chest pain/ACS  Allergies  Allergen Reactions  . Lipitor [Atorvastatin]     Myopathy Spring 2006    Patient Measurements: Height: 5\' 8"  (172.7 cm) Weight: 190 lb (86.183 kg) IBW/kg (Calculated) : 68.4 Heparin Dosing Weight: 86 kg  Vital Signs: Temp: 97.9 F (36.6 C) (06/15 1940) Temp Source: Oral (06/15 1940) BP: 161/73 mmHg (06/16 0130) Pulse Rate: 61 (06/16 0130)  Labs:  Recent Labs  06/17/14 1952 06/18/14 0037  HGB 13.9  --   HCT 41.4  --   PLT 204  --   CREATININE 0.85  --   TROPONINI  --  5.16*    Estimated Creatinine Clearance: 87.6 mL/min (by C-G formula based on Cr of 0.85).   Medical History: Past Medical History  Diagnosis Date  . Hypertension   . Elevated CPK     CPK elevated with normal MB and normal troponin the past  . Chest pain     Nuclear, 2006, no ischemia  . Atrial fibrillation     Paroxysmal, rare episodes, sinus rhythm on flecainide, patient prefers not to take Coumadin  . Arthritis     Right shoulder  . Ejection fraction     EF 55-60%,  echo, January, 2011  . Bradycardia     April, 2013  . Coronary artery disease     s/p staged cath 05/12/2014 and 5/11, DES x 2 to heavily calcified RCA, residual with 60% prox LAD, 70% D1  . Sinus drainage   . Diabetes mellitus without complication     type 2  . IBS (irritable bowel syndrome)     Medications:  See electronic med rec  Assessment: 69 y.o. male presents with CP - coronary stents placed in May. To begin heparin for NSTEMI. Trop 5.16. CBC stable at baseline.  During last admission: heparin level 0.12 on 1200 units/hr  Goal of Therapy:  Heparin level 0.3-0.7 units/ml Monitor platelets by anticoagulation protocol: Yes   Plan:  Heparin IV bolus 4000 units Heparin IV gtt at 1400 units/hr F/u 6 hr heparin level  Daily heparin level and CBC  Sherlon Handing, PharmD, BCPS Clinical pharmacist,  pager [phone] 06/18/2014,2:04 AM

## 2014-06-18 NOTE — Progress Notes (Signed)
ANTICOAGULATION CONSULT NOTE - FOLLOW UP Consult  Pharmacy Consult for Heparin Indication: AFib s/p LHC   Allergies  Allergen Reactions  . Lipitor [Atorvastatin] Other (See Comments)    Myopathy Spring 2006  . Pravastatin Other (See Comments)    LEG CRAMPS  . Simvastatin Other (See Comments)    LEG CRAMPS    Patient Measurements: Height: 5\' 8"  (172.7 cm) Weight: 192 lb (87.091 kg) IBW/kg (Calculated) : 68.4 Heparin Dosing Weight: 86 kg  Vital Signs: Temp: 97.6 F (36.4 C) (06/16 0436) Temp Source: Oral (06/16 0436) BP: 133/76 mmHg (06/16 1425) Pulse Rate: 63 (06/16 1425)  Labs:  Recent Labs  06/17/14 1952 06/18/14 0037 06/18/14 0457 06/18/14 0901 06/18/14 0935  HGB 13.9  --  13.9  --   --   HCT 41.4  --  40.5  --   --   PLT 204  --  201  --   --   LABPROT  --   --  15.1  --   --   INR  --   --  1.18  --   --   HEPARINUNFRC  --   --   --  0.33  --   CREATININE 0.85  --  0.77  --   --   TROPONINI  --  5.16* 10.17*  --  11.28*    Estimated Creatinine Clearance: 93.6 mL/min (by C-G formula based on Cr of 0.77).   Medical History: Past Medical History  Diagnosis Date  . Hypertension   . Elevated CPK     CPK elevated with normal MB and normal troponin the past  . Chest pain     Nuclear, 2006, no ischemia  . Atrial fibrillation     Paroxysmal, rare episodes, sinus rhythm on flecainide, patient prefers not to take Coumadin  . Arthritis     Right shoulder  . Ejection fraction     EF 55-60%,  echo, January, 2011  . Bradycardia     April, 2013  . Coronary artery disease     s/p staged cath 05/12/2014 and 5/11, DES x 2 to heavily calcified RCA, residual with 60% prox LAD, 70% D1  . Sinus drainage   . Diabetes mellitus without complication     type 2  . IBS (irritable bowel syndrome)     Medications:  See electronic med rec  Assessment: 69 y.o. male presented with CP - coronary stents placed in May. He was started on IV heparin at 1400 units/hr and then  underwent LHC. HL this AM was therapeutic at 0.33. H/H and Plt wnl. Now to resume Coumadin 8 hours post sheath removal.   Goal of Therapy:  Heparin level 0.3-0.7 units/ml Monitor platelets by anticoagulation protocol: Yes   Plan:  Resume heparin IV gtt at 1400 units/hr 8 hours post sheath removal. NO BOLUS  F/u 6 hr HL  Daily heparin level and CBC  Albertina Parr, PharmD., BCPS Clinical Pharmacist Pager [phone]

## 2014-06-18 NOTE — Consult Note (Signed)
Reason for Consult:3 vessel CAD s/p MI Referring Physician: Dr.Jordan  [name] is an 69 y.o. male.  HPI: 69 man presents with a cc/o chest pain.  [name] is a 69 yo man with a history of type II diabetes, hypertension, hyperlipidemia, atrial fibrillation and CAD. He had PCI with stenting of his RCA a month ago. He did well until last night when he developed severe dull right sided CP. He came to ED and was admitted. He ruled in for MI.  Today he had cardiac catheterization where he was found to have 3 vessel CAD with an EF of 35%.  He is currently pain free.  He was taking Brillinta prior to admission.  Past Medical History  Diagnosis Date  . Hypertension   . Elevated CPK     CPK elevated with normal MB and normal troponin the past  . Chest pain     Nuclear, 2006, no ischemia  . Atrial fibrillation     Paroxysmal, rare episodes, sinus rhythm on flecainide, patient prefers not to take Coumadin  . Arthritis     Right shoulder  . Ejection fraction     EF 55-60%,  echo, January, 2011  . Bradycardia     April, 2013  . Coronary artery disease     s/p staged cath 05/12/2014 and 5/11, DES x 2 to heavily calcified RCA, residual with 60% prox LAD, 70% D1  . Sinus drainage   . Diabetes mellitus without complication     type 2  . IBS (irritable bowel syndrome)     Past Surgical History  Procedure Laterality Date  . Cardiac catheterization N/A 05/12/2014    Procedure: Left Heart Cath and Coronary Angiography;  Surgeon: Thomas A Kelly, MD;  Location: [facility];  Service: Cardiovascular;  Laterality: N/A;  . Cardiovascular stress test  05/06/2014  . Appendectomy    . Cholecystectomy    . Cardiac catheterization N/A 05/13/2014    Procedure: Coronary/Graft Atherectomy;  Surgeon: Thomas A Kelly, MD;  Location: [facility];  Service: Cardiovascular;  Laterality: N/A;  . Cardiac catheterization Right 05/13/2014    Procedure: Temporary Pacemaker;  Surgeon: Thomas A  Kelly, MD;  Location: [facility];  Service: Cardiovascular;  Laterality: Right;  . Cardiac catheterization N/A 05/13/2014    Procedure: Coronary Stent Intervention;  Surgeon: Thomas A Kelly, MD;  Location: [facility];  Service: Cardiovascular;  Laterality: N/A;  . Coronary stent placement    . Cataract extraction    . Cardiac catheterization N/A 06/18/2014    Procedure: Left Heart Cath and Coronary Angiography;  Surgeon: Peter M Jordan, MD;  Location: [facility];  Service: Cardiovascular;  Laterality: N/A;    Family History  Problem Relation Age of Onset  . Alzheimer's disease Mother   . Emphysema Father   . Cancer Brother   . Arrhythmia Sister   . Heart attack Sister   . Heart disease Sister   . Hyperlipidemia Sister   . Hypertension Sister     Social History:  reports that he has never smoked. He has never used smokeless tobacco. He reports that he does not drink alcohol or use illicit drugs.  Allergies:  Allergies  Allergen Reactions  . Lipitor [Atorvastatin] Other (See Comments)    Myopathy Spring 2006  . Pravastatin Other (See Comments)    LEG CRAMPS  . Simvastatin Other (See Comments)    LEG CRAMPS    Medications:  Prior   to Admission:  Prescriptions prior to admission  Medication Sig Dispense Refill Last Dose  . aspirin 81 MG tablet Take 81 mg by mouth daily.   06/17/2014 at Unknown time  . Besifloxacin HCl (BESIVANCE OP) Place 1 drop into the left eye 3 (three) times daily. Post cataract surgery   06/17/2014 at Unknown time  . clidinium-chlordiazePOXIDE (LIBRAX) 5-2.5 MG per capsule take 1 capsule by mouth three times a day before meals 270 capsule 0 06/17/2014 at Unknown time  . Difluprednate (DUREZOL OP) Place 1 drop into the left eye 4 (four) times daily as needed (post cataract surgery).    06/17/2014 at Unknown time  . diphenoxylate-atropine (LOMOTIL) 2.5-0.025 MG per tablet take 1 tablet by mouth four times a day if needed (Patient taking  differently: Take 1 tablet by mouth 4 (four) times daily. To prevent diarrhea) 180 tablet 0 06/17/2014 at Unknown time  . fluticasone (FLONASE) 50 MCG/ACT nasal spray Place 1 spray into both nostrils as needed. (Patient taking differently: Place 1 spray into both nostrils as needed for allergies or rhinitis. ) 16 g 1 Past Month at Unknown time  . glyBURIDE-metformin (GLUCOVANCE) 5-500 MG per tablet Take 1 tablet by mouth daily with breakfast. (Patient taking differently: Take 1 tablet by mouth 3 (three) times daily. ) 180 tablet 0 06/17/2014 at Unknown time  . loteprednol (LOTEMAX) 0.5 % ophthalmic suspension Place 1 drop into both eyes daily as needed (dry eyes).   Past Month at Unknown time  . metoprolol succinate (TOPROL-XL) 50 MG 24 hr tablet Take 1 tablet (50 mg total) by mouth daily. 30 tablet 11 06/17/2014 at 0800  . Nepafenac (ILEVRO OP) Place 1 drop into the left eye daily. POST CATARACT LENSE SURGERY   06/17/2014 at Unknown time  . nitroGLYCERIN (NITROSTAT) 0.4 MG SL tablet Place 1 tablet (0.4 mg total) under the tongue every 5 (five) minutes as needed for chest pain. 25 tablet 3 06/17/2014 at Unknown time  . ramipril (ALTACE) 5 MG capsule TAKE 1 CAPSULE DAILY. (Patient taking differently: Take 5 mg by mouth daily. ) 90 capsule 3 06/17/2014 at Unknown time  . rosuvastatin (CRESTOR) 10 MG tablet Take 1 tablet (10 mg total) by mouth every Monday, Wednesday, and Friday. 15 tablet 11 06/17/2014 at Unknown time  . Soft Lens Products (AQUIFY MULTI-PURPOSE) SOLN Place 1 drop into both eyes daily as needed (dry eyes).   Past Month at Unknown time  . ticagrelor (BRILINTA) 90 MG TABS tablet Take 1 tablet (90 mg total) by mouth 2 (two) times daily. 180 tablet 3 06/17/2014 at Unknown time  . tamsulosin (FLOMAX) 0.4 MG CAPS capsule Take 1 capsule (0.4 mg total) by mouth daily. (Patient taking differently: Take 0.4 mg by mouth daily as needed (for problem urinations). ) 90 capsule 0 06/15/2014    Results for orders  placed or performed during the hospital encounter of 06/17/14 (from the past 48 hour(s))  CBC     Status: None   Collection Time: 06/17/14  7:52 PM  Result Value Ref Range   WBC 5.2 4.0 - 10.5 K/uL   RBC 4.61 4.22 - 5.81 MIL/uL   Hemoglobin 13.9 13.0 - 17.0 g/dL   HCT 41.4 39.0 - 52.0 %   MCV 89.8 78.0 - 100.0 fL   MCH 30.2 26.0 - 34.0 pg   MCHC 33.6 30.0 - 36.0 g/dL   RDW 13.0 11.5 - 15.5 %   Platelets 204 150 - 400 K/uL  Basic metabolic panel       Status: Abnormal   Collection Time: 06/17/14  7:52 PM  Result Value Ref Range   Sodium 139 135 - 145 mmol/L   Potassium 4.6 3.5 - 5.1 mmol/L   Chloride 106 101 - 111 mmol/L   CO2 25 22 - 32 mmol/L   Glucose, Bld 141 (H) 65 - 99 mg/dL   BUN 14 6 - 20 mg/dL   Creatinine, Ser 0.85 0.61 - 1.24 mg/dL   Calcium 9.7 8.9 - 10.3 mg/dL   GFR calc non Af Amer >60 >60 mL/min   GFR calc Af Amer >60 >60 mL/min    Comment: (NOTE) The eGFR has been calculated using the CKD EPI equation. This calculation has not been validated in all clinical situations. eGFR's persistently <60 mL/min signify possible Chronic Kidney Disease.    Anion gap 8 5 - 15  BNP (order ONLY if patient complains of dyspnea/SOB AND you have documented it for THIS visit)     Status: Abnormal   Collection Time: 06/17/14  7:52 PM  Result Value Ref Range   B Natriuretic Peptide 152.4 (H) 0.0 - 100.0 pg/mL  I-stat troponin, ED  (not at [facility], [facility])     Status: None   Collection Time: 06/17/14  8:03 PM  Result Value Ref Range   Troponin i, poc 0.00 0.00 - 0.08 ng/mL   Comment 3            Comment: Due to the release kinetics of cTnI, a negative result within the first hours of the onset of symptoms does not rule out myocardial infarction with certainty. If myocardial infarction is still suspected, repeat the test at appropriate intervals.   Troponin I     Status: Abnormal   Collection Time: 06/18/14 12:37 AM  Result Value Ref Range   Troponin I 5.16 (HH) <0.031 ng/mL     Comment:        POSSIBLE MYOCARDIAL ISCHEMIA. SERIAL TESTING RECOMMENDED. CRITICAL RESULT CALLED TO, READ BACK BY AND VERIFIED WITH: HUGHES,C RN 06/18/2014 0154 JORDANS REPEATED TO VERIFY   TSH     Status: None   Collection Time: 06/18/14  4:57 AM  Result Value Ref Range   TSH 0.748 0.350 - 4.500 uIU/mL  Magnesium     Status: None   Collection Time: 06/18/14  4:57 AM  Result Value Ref Range   Magnesium 2.0 1.7 - 2.4 mg/dL  Troponin I     Status: Abnormal   Collection Time: 06/18/14  4:57 AM  Result Value Ref Range   Troponin I 10.17 (HH) <0.031 ng/mL    Comment:        POSSIBLE MYOCARDIAL ISCHEMIA. SERIAL TESTING RECOMMENDED. CRITICAL VALUE NOTED.  VALUE IS CONSISTENT WITH PREVIOUSLY REPORTED AND CALLED VALUE. REPEATED TO VERIFY   Brain natriuretic peptide     Status: Abnormal   Collection Time: 06/18/14  4:57 AM  Result Value Ref Range   B Natriuretic Peptide 246.3 (H) 0.0 - 100.0 pg/mL  Basic metabolic panel     Status: Abnormal   Collection Time: 06/18/14  4:57 AM  Result Value Ref Range   Sodium 140 135 - 145 mmol/L   Potassium 3.7 3.5 - 5.1 mmol/L    Comment: DELTA CHECK NOTED   Chloride 109 101 - 111 mmol/L   CO2 23 22 - 32 mmol/L   Glucose, Bld 152 (H) 65 - 99 mg/dL   BUN 11 6 - 20 mg/dL   Creatinine, Ser 0.77 0.61 - 1.24 mg/dL   Calcium 9.2   8.9 - 10.3 mg/dL   GFR calc non Af Amer >60 >60 mL/min   GFR calc Af Amer >60 >60 mL/min    Comment: (NOTE) The eGFR has been calculated using the CKD EPI equation. This calculation has not been validated in all clinical situations. eGFR's persistently <60 mL/min signify possible Chronic Kidney Disease.    Anion gap 8 5 - 15  Lipid panel     Status: Abnormal   Collection Time: 06/18/14  4:57 AM  Result Value Ref Range   Cholesterol 160 0 - 200 mg/dL   Triglycerides 121 <150 mg/dL   HDL 39 (L) >40 mg/dL   Total CHOL/HDL Ratio 4.1 RATIO   VLDL 24 0 - 40 mg/dL   LDL Cholesterol 97 0 - 99 mg/dL    Comment:         Total Cholesterol/HDL:CHD Risk Coronary Heart Disease Risk Table                     Men   Women  1/2 Average Risk   3.4   3.3  Average Risk       5.0   4.4  2 X Average Risk   9.6   7.1  3 X Average Risk  23.4   11.0        Use the calculated Patient Ratio above and the CHD Risk Table to determine the patient's CHD Risk.        ATP III CLASSIFICATION (LDL):  <100     mg/dL   Optimal  100-129  mg/dL   Near or Above                    Optimal  130-159  mg/dL   Borderline  160-189  mg/dL   High  >190     mg/dL   Very High   CBC     Status: None   Collection Time: 06/18/14  4:57 AM  Result Value Ref Range   WBC 6.7 4.0 - 10.5 K/uL   RBC 4.59 4.22 - 5.81 MIL/uL   Hemoglobin 13.9 13.0 - 17.0 g/dL   HCT 40.5 39.0 - 52.0 %   MCV 88.2 78.0 - 100.0 fL   MCH 30.3 26.0 - 34.0 pg   MCHC 34.3 30.0 - 36.0 g/dL   RDW 13.1 11.5 - 15.5 %   Platelets 201 150 - 400 K/uL  Protime-INR     Status: None   Collection Time: 06/18/14  4:57 AM  Result Value Ref Range   Prothrombin Time 15.1 11.6 - 15.2 seconds   INR 1.18 0.00 - 1.49  Glucose, capillary     Status: Abnormal   Collection Time: 06/18/14  7:46 AM  Result Value Ref Range   Glucose-Capillary 171 (H) 65 - 99 mg/dL  Heparin level (unfractionated)     Status: None   Collection Time: 06/18/14  9:01 AM  Result Value Ref Range   Heparin Unfractionated 0.33 0.30 - 0.70 IU/mL    Comment:        IF HEPARIN RESULTS ARE BELOW EXPECTED VALUES, AND PATIENT DOSAGE HAS BEEN CONFIRMED, SUGGEST FOLLOW UP TESTING OF ANTITHROMBIN III LEVELS.   Troponin I     Status: Abnormal   Collection Time: 06/18/14  9:35 AM  Result Value Ref Range   Troponin I 11.28 (HH) <0.031 ng/mL    Comment:        POSSIBLE MYOCARDIAL ISCHEMIA. SERIAL TESTING RECOMMENDED. REPEATED TO VERIFY CRITICAL VALUE NOTED.    VALUE IS CONSISTENT WITH PREVIOUSLY REPORTED AND CALLED VALUE.   POCT Activated clotting time     Status: None   Collection Time: 06/18/14 11:24 AM   Result Value Ref Range   Activated Clotting Time 257 seconds  Glucose, capillary     Status: Abnormal   Collection Time: 06/18/14 12:32 PM  Result Value Ref Range   Glucose-Capillary 153 (H) 65 - 99 mg/dL  Troponin I     Status: Abnormal   Collection Time: 06/18/14  4:22 PM  Result Value Ref Range   Troponin I 8.89 (HH) <0.031 ng/mL    Comment:        POSSIBLE MYOCARDIAL ISCHEMIA. SERIAL TESTING RECOMMENDED. REPEATED TO VERIFY CRITICAL RESULT CALLED TO, READ BACK BY AND VERIFIED WITH: T.AYERS,RN 06/18/14 @1745 BY V.WILKINS   Glucose, capillary     Status: Abnormal   Collection Time: 06/18/14  4:22 PM  Result Value Ref Range   Glucose-Capillary 249 (H) 65 - 99 mg/dL    Dg Chest 2 View  06/17/2014   CLINICAL DATA:  Acute onset of central chest pain. Initial encounter.  EXAM: CHEST  2 VIEW  COMPARISON:  Chest radiograph performed 11/22/2007  FINDINGS: The lungs are well-aerated and clear. There is no evidence of focal opacification, pleural effusion or pneumothorax.  The heart is borderline normal in size. No acute osseous abnormalities are seen.  IMPRESSION: No acute cardiopulmonary process seen.   Electronically Signed   By: Jeffery  Chang M.D.   On: 06/17/2014 20:26    Review of Systems  Constitutional: Negative for malaise/fatigue.  HENT: Negative for nosebleeds.   Eyes:       S/p cataract extraction 06/14/14. L eye itches  Respiratory: Positive for shortness of breath. Negative for cough.   Cardiovascular: Positive for chest pain. Negative for orthopnea and claudication.  Gastrointestinal: Positive for heartburn. Negative for diarrhea, constipation and blood in stool.  Genitourinary: Negative for urgency and frequency.  Musculoskeletal: Positive for joint pain (right shoulder).  Neurological: Negative for dizziness, sensory change, speech change, focal weakness, loss of consciousness and headaches.  Endo/Heme/Allergies: Does not bruise/bleed easily.  Psychiatric/Behavioral:  Negative.   All other systems reviewed and are negative.  Blood pressure 129/72, pulse 67, temperature 97.6 F (36.4 C), temperature source Oral, resp. rate 18, height 5' 8" (1.727 m), weight 192 lb (87.091 kg), SpO2 98 %. Physical Exam  Vitals reviewed. Constitutional: He is oriented to person, place, and time. He appears well-developed and well-nourished. No distress.  HENT:  Head: Normocephalic and atraumatic.  Shield over left eye  Eyes: EOM are normal. No scleral icterus.  Neck: Neck supple. No thyromegaly present.  Cardiovascular: Normal rate, regular rhythm and intact distal pulses.  Exam reveals no gallop and no friction rub.   Murmur (faint systolic murmur) heard. Respiratory: Effort normal and breath sounds normal. He has no wheezes. He has no rales.  GI: Soft. Bowel sounds are normal. He exhibits no distension. There is no tenderness.  Musculoskeletal: He exhibits no edema.  Lymphadenopathy:    He has no cervical adenopathy.  Neurological: He is alert and oriented to person, place, and time. No cranial nerve deficit.  Skin: Skin is warm and dry.  Psychiatric: He has a normal mood and affect.    Assessment/Plan: 69 yo man with known CAD s/p recent PTCA/stent presents with unstable recurrent angina and ruled in for non STEMI. Cath revealed early restenosis of a stent in the RCA and significant disease in the LAD and circumflex.   He has severe LV dysfunction with an EF of 35%. CABG is indicated for survival benefit and relief of symptoms.  I have discussed the general nature of the procedure, the need for general anesthesia, the use of cardiopulmonary bypass, and the incisions to be used with Mr and Mrs Cuccaro. I discussed the expected hospital stay, overall recovery and short and long term outcomes. They understand the risks include, but are not limited to death, stroke, MI, DVT/PE, bleeding, possible need for transfusion, infections, cardiac arrhythmias, and other organ system  dysfunction including respiratory, renal, or GI complications.   He accepts the risks and agrees to proceed.  Plan CABG Monday 06/22/14 to allow for Brillinta wash out.  Melrose Nakayama 06/18/2014, 9:15 PM

## 2014-06-18 NOTE — H&P (Signed)
[name] is an 69 y.o. male.    Chief Complaint: chest pain Primary Cardiologist: Dr. Ron Parker HPI: [name] is a 69 yo man with PMH of hypertension, paroxysmal atrial fibrillation (deferred warfarin), coronary artery disease, T2DM who has been on flecainide (now discontinued for CAD) last seen in clinic 06/10/14 in (Dr. Ron Parker 4/16) who presents tonight with chest discomfort. He had a LHC 05/12/14 for positive stress echo with hypokinesis of inferior lateral wall that demonstrated moderate CAD in LAD with proximal 60% and mid 50% stenosis, 70% D1 stenosis and high-grade proximal 90% RCA and mid 95% RCA stenoses with RCA heavily calcified. He had PCI on 05/13/14 with high-speed rotational atherectomy and DES placement to the RCA 2. He is known to be intolerant to multiple statins. Tonight he developed CP characterized as pressure and soreness starting at 19:00 with SL NTG improving his symptoms (he took them after some period of time). He had three hours of pain that gradually resolved. HE has been compliant with all of his medications including aspirin/brilinta. He had cataract surgery on Monday    Past Medical History  Diagnosis Date  . Hypertension   . Elevated CPK     CPK elevated with normal MB and normal troponin the past  . Chest pain     Nuclear, 2006, no ischemia  . Atrial fibrillation     Paroxysmal, rare episodes, sinus rhythm on flecainide, patient prefers not to take Coumadin  . Arthritis     Right shoulder  . Ejection fraction     EF 55-60%,  echo, January, 2011  . Bradycardia     April, 2013  . Coronary artery disease     s/p staged cath 05/12/2014 and 5/11, DES x 2 to heavily calcified RCA, residual with 60% prox LAD, 70% D1  . Sinus drainage   . Diabetes mellitus without complication     type 2  . IBS (irritable bowel syndrome)     Past Surgical History  Procedure Laterality Date  . Cardiac catheterization N/A 05/12/2014    Procedure: Left Heart Cath and Coronary  Angiography;  Surgeon: Troy Sine, MD;  Location: [facility];  Service: Cardiovascular;  Laterality: N/A;  . Cardiovascular stress test  05/06/2014  . Appendectomy    . Cholecystectomy    . Cardiac catheterization N/A 05/13/2014    Procedure: Coronary/Graft Atherectomy;  Surgeon: Troy Sine, MD;  Location: [facility];  Service: Cardiovascular;  Laterality: N/A;  . Cardiac catheterization Right 05/13/2014    Procedure: Temporary Pacemaker;  Surgeon: Troy Sine, MD;  Location: [facility];  Service: Cardiovascular;  Laterality: Right;  . Cardiac catheterization N/A 05/13/2014    Procedure: Coronary Stent Intervention;  Surgeon: Troy Sine, MD;  Location: [place];  Service: Cardiovascular;  Laterality: N/A;  . Coronary stent placement    . Cataract extraction      Family History  Problem Relation Age of Onset  . Alzheimer's disease Mother   . Emphysema Father   . Cancer Brother   . Arrhythmia Sister   . Heart attack Sister   . Heart disease Sister   . Hyperlipidemia Sister   . Hypertension Sister    Social History:  reports that he has never smoked. He has never used smokeless tobacco. He reports that he does not drink alcohol or use illicit drugs.  Allergies:  Allergies  Allergen Reactions  . Lipitor [Atorvastatin] Other (See Comments)  Myopathy Spring 2006  . Pravastatin Other (See Comments)    LEG CRAMPS  . Simvastatin Other (See Comments)    LEG CRAMPS     (Not in a hospital admission)  Results for orders placed or performed during the hospital encounter of 06/17/14 (from the past 48 hour(s))  CBC     Status: None   Collection Time: 06/17/14  7:52 PM  Result Value Ref Range   WBC 5.2 4.0 - 10.5 K/uL   RBC 4.61 4.22 - 5.81 MIL/uL   Hemoglobin 13.9 13.0 - 17.0 g/dL   HCT 41.4 39.0 - 52.0 %   MCV 89.8 78.0 - 100.0 fL   MCH 30.2 26.0 - 34.0 pg   MCHC 33.6 30.0 - 36.0 g/dL   RDW 13.0 11.5 - 15.5 %   Platelets 204 150 -  400 K/uL  Basic metabolic panel     Status: Abnormal   Collection Time: 06/17/14  7:52 PM  Result Value Ref Range   Sodium 139 135 - 145 mmol/L   Potassium 4.6 3.5 - 5.1 mmol/L   Chloride 106 101 - 111 mmol/L   CO2 25 22 - 32 mmol/L   Glucose, Bld 141 (H) 65 - 99 mg/dL   BUN 14 6 - 20 mg/dL   Creatinine, Ser 0.85 0.61 - 1.24 mg/dL   Calcium 9.7 8.9 - 10.3 mg/dL   GFR calc non Af Amer >60 >60 mL/min   GFR calc Af Amer >60 >60 mL/min    Comment: (NOTE) The eGFR has been calculated using the CKD EPI equation. This calculation has not been validated in all clinical situations. eGFR's persistently <60 mL/min signify possible Chronic Kidney Disease.    Anion gap 8 5 - 15  BNP (order ONLY if patient complains of dyspnea/SOB AND you have documented it for THIS visit)     Status: Abnormal   Collection Time: 06/17/14  7:52 PM  Result Value Ref Range   B Natriuretic Peptide 152.4 (H) 0.0 - 100.0 pg/mL  I-stat troponin, ED  (not at [facility], [facility])     Status: None   Collection Time: 06/17/14  8:03 PM  Result Value Ref Range   Troponin i, poc 0.00 0.00 - 0.08 ng/mL   Comment 3            Comment: Due to the release kinetics of cTnI, a negative result within the first hours of the onset of symptoms does not rule out myocardial infarction with certainty. If myocardial infarction is still suspected, repeat the test at appropriate intervals.   Troponin I     Status: Abnormal   Collection Time: 06/18/14 12:37 AM  Result Value Ref Range   Troponin I 5.16 (HH) <0.031 ng/mL    Comment:        POSSIBLE MYOCARDIAL ISCHEMIA. SERIAL TESTING RECOMMENDED. CRITICAL RESULT CALLED TO, READ BACK BY AND VERIFIED WITH: HUGHES,C RN 06/18/2014 0154 JORDANS REPEATED TO VERIFY    Dg Chest 2 View  06/17/2014   CLINICAL DATA:  Acute onset of central chest pain. Initial encounter.  EXAM: CHEST  2 VIEW  COMPARISON:  Chest radiograph performed 11/22/2007  FINDINGS: The lungs are well-aerated and clear. There is  no evidence of focal opacification, pleural effusion or pneumothorax.  The heart is borderline normal in size. No acute osseous abnormalities are seen.  IMPRESSION: No acute cardiopulmonary process seen.   Electronically Signed   By: Garald Balding M.D.   On: 06/17/2014 20:26    Review of  Systems  Constitutional: Negative for fever, chills and weight loss.  HENT: Negative for hearing loss and tinnitus.   Eyes: Negative for blurred vision and double vision.  Respiratory: Negative for cough, hemoptysis and sputum production.   Cardiovascular: Positive for chest pain. Negative for orthopnea.  Gastrointestinal: Positive for heartburn. Negative for nausea, vomiting and abdominal pain.  Genitourinary: Negative for dysuria, urgency and hematuria.  Musculoskeletal: Negative for myalgias and neck pain.  Skin: Negative for rash.  Neurological: Negative for dizziness, tingling, tremors and headaches.  Endo/Heme/Allergies: Negative for polydipsia.  Psychiatric/Behavioral: Negative for depression, suicidal ideas and substance abuse.    Blood pressure 170/79, pulse 63, temperature 97.9 F (36.6 C), temperature source Oral, resp. rate 19, height '5\' 8"'  (1.727 m), weight 86.183 kg (190 lb), SpO2 100 %. Physical Exam  Nursing note and vitals reviewed. Constitutional: He is oriented to person, place, and time. He appears well-developed and well-nourished. No distress.  HENT:  Head: Normocephalic and atraumatic.  Nose: Nose normal.  Mouth/Throat: Oropharynx is clear and moist. No oropharyngeal exudate.  Eyes: Conjunctivae and EOM are normal. Pupils are equal, round, and reactive to light. No scleral icterus.  Neck: Normal range of motion. Neck supple. No JVD present. No tracheal deviation present.  Cardiovascular: Normal rate, regular rhythm, normal heart sounds and intact distal pulses.  Exam reveals no gallop.   No murmur heard. Respiratory: Breath sounds normal. No respiratory distress. He has no  wheezes. He has no rales.  GI: Soft. Bowel sounds are normal. He exhibits no distension. There is no tenderness.  Musculoskeletal: Normal range of motion. He exhibits no edema or tenderness.  Neurological: He is alert and oriented to person, place, and time. No cranial nerve deficit. Coordination normal.  Skin: Skin is warm and dry. No rash noted. He is not diaphoretic. No erythema.  Psychiatric: He has a normal mood and affect. His behavior is normal. Thought content normal.   labs reviewed; cr 0.85, K 4.6 Chest x-ray no acute process EKG: NSR, inferior infarct age indeterminate 1/11 Echo: EF 55-60%, grade I DD  Assessment/Plan  Mr. Bryley Kovacevic is a 69 yo man with PMH of hypertension, paroxysmal atrial fibrillation (deferred warfarin), coronary artery disease, T2DM who had recent PCI with atherectomy and DES x2 to RCA who presents with chest pain. Differential diagnosis is musculoskeletal pain, esophageal spasm, GERD, aortic dissection, pericarditis, ACS/NSTEMI among other etiologies. I favor a diagnosis of NSTEMI. Will start heparin, continue aspirin, admit to stepdown and watch closely for any changes in symptoms, low threshold to active cath lab.  Problem List Chest Pain/NSTEMI CAD Dyslipidemia Recent Cataract Surgery (Monday) Hypertension T2DM Plan 1. NPO after MN for likely LHC in AM. I discussed this plan with the patient.  2. trend cardiac markers, observation on telemetry, admit to telemetry 3. plan for LHC in AM; if symptoms change, low threshold to activate cath lab 4. asa 81 mg, heparin gtt, metoprolol 50 mg XL, ramipril 5 mg  5. No statin given previous intolerance - crestor 10 mg every other day  6. hba1c, tsh, lipid panel, BNP 7. Holding glyburide/metformin   Ariela Mochizuki 06/18/2014, 3:28 AM

## 2014-06-18 NOTE — Progress Notes (Signed)
Subjective:  Currently not having CP. NSTEMI on IV hep  Objective:  Temp:  [97.6 F (36.4 C)-97.9 F (36.6 C)] 97.6 F (36.4 C) (06/16 0436) Pulse Rate:  [53-68] 63 (06/16 0436) Resp:  [11-24] 13 (06/16 0436) BP: (147-184)/(66-86) 147/74 mmHg (06/16 0436) SpO2:  [98 %-100 %] 99 % (06/16 0436) Weight:  [190 lb (86.183 kg)-192 lb (87.091 kg)] 192 lb (87.091 kg) (06/16 0420) Weight change:   Intake/Output from previous day:    Intake/Output from this shift: Total I/O In: 84.5 [I.V.:84.5] Out: -   Physical Exam: General appearance: alert and no distress Neck: no adenopathy, no carotid bruit, no JVD, supple, symmetrical, trachea midline and thyroid not enlarged, symmetric, no tenderness/mass/nodules Lungs: clear to auscultation bilaterally Heart: regular rate and rhythm, S1, S2 normal, no murmur, click, rub or gallop Extremities: extremities normal, atraumatic, no cyanosis or edema  Lab Results: Results for orders placed or performed during the hospital encounter of 06/17/14 (from the past 48 hour(s))  CBC     Status: None   Collection Time: 06/17/14  7:52 PM  Result Value Ref Range   WBC 5.2 4.0 - 10.5 K/uL   RBC 4.61 4.22 - 5.81 MIL/uL   Hemoglobin 13.9 13.0 - 17.0 g/dL   HCT 41.4 39.0 - 52.0 %   MCV 89.8 78.0 - 100.0 fL   MCH 30.2 26.0 - 34.0 pg   MCHC 33.6 30.0 - 36.0 g/dL   RDW 13.0 11.5 - 15.5 %   Platelets 204 150 - 400 K/uL  Basic metabolic panel     Status: Abnormal   Collection Time: 06/17/14  7:52 PM  Result Value Ref Range   Sodium 139 135 - 145 mmol/L   Potassium 4.6 3.5 - 5.1 mmol/L   Chloride 106 101 - 111 mmol/L   CO2 25 22 - 32 mmol/L   Glucose, Bld 141 (H) 65 - 99 mg/dL   BUN 14 6 - 20 mg/dL   Creatinine, Ser 0.85 0.61 - 1.24 mg/dL   Calcium 9.7 8.9 - 10.3 mg/dL   GFR calc non Af Amer >60 >60 mL/min   GFR calc Af Amer >60 >60 mL/min    Comment: (NOTE) The eGFR has been calculated using the CKD EPI equation. This calculation has not been  validated in all clinical situations. eGFR's persistently <60 mL/min signify possible Chronic Kidney Disease.    Anion gap 8 5 - 15  BNP (order ONLY if patient complains of dyspnea/SOB AND you have documented it for THIS visit)     Status: Abnormal   Collection Time: 06/17/14  7:52 PM  Result Value Ref Range   B Natriuretic Peptide 152.4 (H) 0.0 - 100.0 pg/mL  I-stat troponin, ED  (not at [facility], [facility])     Status: None   Collection Time: 06/17/14  8:03 PM  Result Value Ref Range   Troponin i, poc 0.00 0.00 - 0.08 ng/mL   Comment 3            Comment: Due to the release kinetics of cTnI, a negative result within the first hours of the onset of symptoms does not rule out myocardial infarction with certainty. If myocardial infarction is still suspected, repeat the test at appropriate intervals.   Troponin I     Status: Abnormal   Collection Time: 06/18/14 12:37 AM  Result Value Ref Range   Troponin I 5.16 (HH) <0.031 ng/mL    Comment:        POSSIBLE  MYOCARDIAL ISCHEMIA. SERIAL TESTING RECOMMENDED. CRITICAL RESULT CALLED TO, READ BACK BY AND VERIFIED WITH: HUGHES,C RN 06/18/2014 0154 JORDANS REPEATED TO VERIFY   TSH     Status: None   Collection Time: 06/18/14  4:57 AM  Result Value Ref Range   TSH 0.748 0.350 - 4.500 uIU/mL  Magnesium     Status: None   Collection Time: 06/18/14  4:57 AM  Result Value Ref Range   Magnesium 2.0 1.7 - 2.4 mg/dL  Troponin I     Status: Abnormal   Collection Time: 06/18/14  4:57 AM  Result Value Ref Range   Troponin I 10.17 (HH) <0.031 ng/mL    Comment:        POSSIBLE MYOCARDIAL ISCHEMIA. SERIAL TESTING RECOMMENDED. CRITICAL VALUE NOTED.  VALUE IS CONSISTENT WITH PREVIOUSLY REPORTED AND CALLED VALUE. REPEATED TO VERIFY   Brain natriuretic peptide     Status: Abnormal   Collection Time: 06/18/14  4:57 AM  Result Value Ref Range   B Natriuretic Peptide 246.3 (H) 0.0 - 100.0 pg/mL  Basic metabolic panel     Status: Abnormal   Collection  Time: 06/18/14  4:57 AM  Result Value Ref Range   Sodium 140 135 - 145 mmol/L   Potassium 3.7 3.5 - 5.1 mmol/L    Comment: DELTA CHECK NOTED   Chloride 109 101 - 111 mmol/L   CO2 23 22 - 32 mmol/L   Glucose, Bld 152 (H) 65 - 99 mg/dL   BUN 11 6 - 20 mg/dL   Creatinine, Ser 0.77 0.61 - 1.24 mg/dL   Calcium 9.2 8.9 - 10.3 mg/dL   GFR calc non Af Amer >60 >60 mL/min   GFR calc Af Amer >60 >60 mL/min    Comment: (NOTE) The eGFR has been calculated using the CKD EPI equation. This calculation has not been validated in all clinical situations. eGFR's persistently <60 mL/min signify possible Chronic Kidney Disease.    Anion gap 8 5 - 15  Lipid panel     Status: Abnormal   Collection Time: 06/18/14  4:57 AM  Result Value Ref Range   Cholesterol 160 0 - 200 mg/dL   Triglycerides 121 <150 mg/dL   HDL 39 (L) >40 mg/dL   Total CHOL/HDL Ratio 4.1 RATIO   VLDL 24 0 - 40 mg/dL   LDL Cholesterol 97 0 - 99 mg/dL    Comment:        Total Cholesterol/HDL:CHD Risk Coronary Heart Disease Risk Table                     Men   Women  1/2 Average Risk   3.4   3.3  Average Risk       5.0   4.4  2 X Average Risk   9.6   7.1  3 X Average Risk  23.4   11.0        Use the calculated Patient Ratio above and the CHD Risk Table to determine the patient's CHD Risk.        ATP III CLASSIFICATION (LDL):  <100     mg/dL   Optimal  100-129  mg/dL   Near or Above                    Optimal  130-159  mg/dL   Borderline  160-189  mg/dL   High  >190     mg/dL   Very High   CBC  Status: None   Collection Time: 06/18/14  4:57 AM  Result Value Ref Range   WBC 6.7 4.0 - 10.5 K/uL   RBC 4.59 4.22 - 5.81 MIL/uL   Hemoglobin 13.9 13.0 - 17.0 g/dL   HCT 40.5 39.0 - 52.0 %   MCV 88.2 78.0 - 100.0 fL   MCH 30.3 26.0 - 34.0 pg   MCHC 34.3 30.0 - 36.0 g/dL   RDW 13.1 11.5 - 15.5 %   Platelets 201 150 - 400 K/uL  Protime-INR     Status: None   Collection Time: 06/18/14  4:57 AM  Result Value Ref Range    Prothrombin Time 15.1 11.6 - 15.2 seconds   INR 1.18 0.00 - 1.49  Glucose, capillary     Status: Abnormal   Collection Time: 06/18/14  7:46 AM  Result Value Ref Range   Glucose-Capillary 171 (H) 65 - 99 mg/dL    Imaging: Imaging results have been reviewed  Assessment/Plan:   1. Principal Problem: 2.   NSTEMI (non-ST elevated myocardial infarction) 3. Active Problems: 4.   DM type 2 (diabetes mellitus, type 2) 5.   ATRIAL FIBRILLATION, PAROXYSMAL 6.   Hypertension 7.   Hyperlipidemia 8.   Time Spent Directly with Patient:  20 minutes  Length of Stay:  LOS: 0 days   S/P HSRA/ PCI-Stent RCA X2 one month ago on DAPT with Brilenta. Recurrent CP last PM, currently pain free on IV hep with rising Trop 5---->>10. Exam benign. Apparently had difficulty with Right radial access by Dr. Leda Gauze last admission and went right femoral. Will need cath this Am. Have notified Cath lab. Dr. Shirlee More to perform.   Quay Burow 06/18/2014, 10:11 AM

## 2014-06-19 ENCOUNTER — Inpatient Hospital Stay (HOSPITAL_COMMUNITY): Payer: Medicare Other

## 2014-06-19 DIAGNOSIS — I255 Ischemic cardiomyopathy: Secondary | ICD-10-CM

## 2014-06-19 DIAGNOSIS — I25111 Atherosclerotic heart disease of native coronary artery with angina pectoris with documented spasm: Secondary | ICD-10-CM

## 2014-06-19 DIAGNOSIS — I10 Essential (primary) hypertension: Secondary | ICD-10-CM

## 2014-06-19 DIAGNOSIS — E785 Hyperlipidemia, unspecified: Secondary | ICD-10-CM

## 2014-06-19 DIAGNOSIS — I519 Heart disease, unspecified: Secondary | ICD-10-CM

## 2014-06-19 DIAGNOSIS — I480 Paroxysmal atrial fibrillation: Secondary | ICD-10-CM

## 2014-06-19 LAB — PULMONARY FUNCTION TEST
DL/VA % pred: 119 %
DL/VA: 5.17 ml/min/mmHg/L
DLCO UNC % PRED: 121 %
DLCO UNC: 32.72 ml/min/mmHg
DLCO cor % pred: 123 %
DLCO cor: 33.29 ml/min/mmHg
FEF 25-75 PRE: 1.59 L/s
FEF 25-75 Post: 2.67 L/sec
FEF2575-%CHANGE-POST: 67 %
FEF2575-%PRED-POST: 123 %
FEF2575-%Pred-Pre: 73 %
FEV1-%Change-Post: 17 %
FEV1-%PRED-PRE: 82 %
FEV1-%Pred-Post: 96 %
FEV1-PRE: 2.31 L
FEV1-Post: 2.71 L
FEV1FVC-%Change-Post: 6 %
FEV1FVC-%PRED-PRE: 96 %
FEV6-%CHANGE-POST: 12 %
FEV6-%PRED-POST: 99 %
FEV6-%Pred-Pre: 88 %
FEV6-Post: 3.55 L
FEV6-Pre: 3.16 L
FEV6FVC-%CHANGE-POST: 0 %
FEV6FVC-%Pred-Post: 106 %
FEV6FVC-%Pred-Pre: 105 %
FVC-%Change-Post: 10 %
FVC-%PRED-POST: 93 %
FVC-%Pred-Pre: 84 %
FVC-POST: 3.56 L
FVC-Pre: 3.23 L
POST FEV1/FVC RATIO: 76 %
Post FEV6/FVC ratio: 100 %
Pre FEV1/FVC ratio: 72 %
Pre FEV6/FVC Ratio: 99 %
RV % pred: 124 %
RV: 2.74 L
TLC % pred: 105 %
TLC: 6.59 L

## 2014-06-19 LAB — CBC
HCT: 42.2 % (ref 39.0–52.0)
Hemoglobin: 14 g/dL (ref 13.0–17.0)
MCH: 30 pg (ref 26.0–34.0)
MCHC: 33.2 g/dL (ref 30.0–36.0)
MCV: 90.4 fL (ref 78.0–100.0)
Platelets: 180 10*3/uL (ref 150–400)
RBC: 4.67 MIL/uL (ref 4.22–5.81)
RDW: 13.2 % (ref 11.5–15.5)
WBC: 7.4 10*3/uL (ref 4.0–10.5)

## 2014-06-19 LAB — URINALYSIS, ROUTINE W REFLEX MICROSCOPIC
Bilirubin Urine: NEGATIVE
Hgb urine dipstick: NEGATIVE
Ketones, ur: NEGATIVE mg/dL
Nitrite: NEGATIVE
PROTEIN: NEGATIVE mg/dL
SPECIFIC GRAVITY, URINE: 1.016 (ref 1.005–1.030)
Urobilinogen, UA: 0.2 mg/dL (ref 0.0–1.0)
pH: 5 (ref 5.0–8.0)

## 2014-06-19 LAB — BASIC METABOLIC PANEL
ANION GAP: 9 (ref 5–15)
BUN: 9 mg/dL (ref 6–20)
CO2: 23 mmol/L (ref 22–32)
Calcium: 8.9 mg/dL (ref 8.9–10.3)
Chloride: 105 mmol/L (ref 101–111)
Creatinine, Ser: 0.79 mg/dL (ref 0.61–1.24)
GFR calc Af Amer: >60 mL/min (ref >60)
GLUCOSE: 181 mg/dL — AB (ref 65–99)
Potassium: 3.8 mmol/L (ref 3.5–5.1)
SODIUM: 137 mmol/L (ref 135–145)

## 2014-06-19 LAB — GLUCOSE, CAPILLARY
GLUCOSE-CAPILLARY: 195 mg/dL — AB (ref 65–99)
Glucose-Capillary: 252 mg/dL — ABNORMAL HIGH (ref 65–99)
Glucose-Capillary: 216 mg/dL — ABNORMAL HIGH (ref 65–99)
Glucose-Capillary: 251 mg/dL — ABNORMAL HIGH (ref 65–99)

## 2014-06-19 LAB — HEPARIN LEVEL (UNFRACTIONATED)
HEPARIN UNFRACTIONATED: 0.33 [IU]/mL (ref 0.30–0.70)
Heparin Unfractionated: 0.12 IU/mL — ABNORMAL LOW (ref 0.30–0.70)
Heparin Unfractionated: 0.21 IU/mL — ABNORMAL LOW (ref 0.30–0.70)

## 2014-06-19 LAB — URINE MICROSCOPIC-ADD ON

## 2014-06-19 LAB — PROTIME-INR
INR: 1.13 (ref 0.00–1.49)
Prothrombin Time: 14.7 seconds (ref 11.6–15.2)

## 2014-06-19 LAB — TYPE AND SCREEN
ABO/RH(D): A POS
ANTIBODY SCREEN: NEGATIVE

## 2014-06-19 LAB — HEMOGLOBIN A1C
Hgb A1c MFr Bld: 9.1 % — ABNORMAL HIGH (ref 4.8–5.6)
MEAN PLASMA GLUCOSE: 214 mg/dL

## 2014-06-19 LAB — APTT: APTT: 48 s — AB (ref 24–37)

## 2014-06-19 LAB — ABO/RH: ABO/RH(D): A POS

## 2014-06-19 MED ORDER — HEPARIN (PORCINE) IN NACL 100-0.45 UNIT/ML-% IJ SOLN
1750.0000 [IU]/h | INTRAMUSCULAR | Status: DC
  Administered 2014-06-19 – 2014-06-20 (×3): 1650 [IU]/h via INTRAVENOUS
  Administered 2014-06-21 – 2014-06-22 (×2): 1750 [IU]/h via INTRAVENOUS
  Filled 2014-06-19 (×6): qty 250

## 2014-06-19 MED ORDER — ALBUTEROL SULFATE (2.5 MG/3ML) 0.083% IN NEBU
2.5000 mg | INHALATION_SOLUTION | Freq: Once | RESPIRATORY_TRACT | Status: AC
  Administered 2014-06-19: 2.5 mg via RESPIRATORY_TRACT

## 2014-06-19 MED FILL — Heparin Sodium (Porcine) 2 Unit/ML in Sodium Chloride 0.9%: INTRAMUSCULAR | Qty: 1000 | Status: AC

## 2014-06-19 MED FILL — Lidocaine HCl Local Preservative Free (PF) Inj 1%: INTRAMUSCULAR | Qty: 30 | Status: AC

## 2014-06-19 MED FILL — Nitroglycerin IV Soln 100 MCG/ML in D5W: INTRA_ARTERIAL | Qty: 10 | Status: AC

## 2014-06-19 NOTE — Progress Notes (Signed)
Pre-op Cardiac Surgery  Carotid Findings:  Bilateral:  1-39% ICA stenosis.  Vertebral artery flow is antegrade.      Upper Extremity Right Left  Brachial Pressures 123 127  Radial Waveforms Tri Tri  Ulnar Waveforms Tri Tri  Palmar Arch (Allen's Test) Obliterates with radial compression, normal with ulnar compression Decreases >50% with radial compression, normal with ulnar compression   Landry Mellow, RDMS, RVT

## 2014-06-19 NOTE — Progress Notes (Signed)
ANTICOAGULATION CONSULT NOTE - FOLLOW UP Consult  Pharmacy Consult for Heparin Indication: AFib/CAD  Allergies  Allergen Reactions  . Lipitor [Atorvastatin] Other (See Comments)    Myopathy Spring 2006  . Pravastatin Other (See Comments)    LEG CRAMPS  . Simvastatin Other (See Comments)    LEG CRAMPS    Patient Measurements: Height: 5\' 8"  (172.7 cm) Weight: 185 lb 11.2 oz (84.233 kg) IBW/kg (Calculated) : 68.4 Heparin Dosing Weight: 86 kg  Vital Signs: Temp: 98.3 F (36.8 C) (06/17 0500) Temp Source: Oral (06/17 0500) BP: 108/62 mmHg (06/17 0950) Pulse Rate: 72 (06/17 0950)  Labs:  Recent Labs  06/17/14 1952  06/18/14 0457 06/18/14 0901 06/18/14 0935 06/18/14 1622 06/18/14 2344 06/19/14 0253 06/19/14 1037  HGB 13.9  --  13.9  --   --   --   --  14.0  --   HCT 41.4  --  40.5  --   --   --   --  42.2  --   PLT 204  --  201  --   --   --   --  180  --   APTT  --   --   --   --   --   --  48*  --   --   LABPROT  --   --  15.1  --   --   --  14.7  --   --   INR  --   --  1.18  --   --   --  1.13  --   --   HEPARINUNFRC  --   --   --  0.33  --   --   --  0.12* 0.21*  CREATININE 0.85  --  0.77  --   --   --   --  0.79  --   TROPONINI  --   < > 10.17*  --  11.28* 8.89*  --   --   --   < > = values in this interval not displayed.  Estimated Creatinine Clearance: 92.1 mL/min (by C-G formula based on Cr of 0.79).   Medical History: Past Medical History  Diagnosis Date  . Hypertension   . Elevated CPK     CPK elevated with normal MB and normal troponin the past  . Chest pain     Nuclear, 2006, no ischemia  . Atrial fibrillation     Paroxysmal, rare episodes, sinus rhythm on flecainide, patient prefers not to take Coumadin  . Arthritis     Right shoulder  . Ejection fraction     EF 55-60%,  echo, January, 2011  . Bradycardia     April, 2013  . Coronary artery disease     s/p staged cath 05/12/2014 and 5/11, DES x 2 to heavily calcified RCA, residual with 60%  prox LAD, 70% D1  . Sinus drainage   . Diabetes mellitus without complication     type 2  . IBS (irritable bowel syndrome)     Medications:  See electronic med rec  Assessment: 69 y.o. male presented with CP - coronary stents placed in May. Has been on IV heparin since post cath. Plan is for CABG on Monday. Heparin is subtherapeutic right now. We'll adjust rate.  Goal of Therapy:  Heparin level 0.3-0.7 units/ml Monitor platelets by anticoagulation protocol: Yes   Plan:   Increase heparin to 1650 units/hr F/u with 6 hr level Daily level and CBC  Onnie Boer,  PharmD Pager: [phone] 06/19/2014 12:11 PM

## 2014-06-19 NOTE — Progress Notes (Signed)
1 Day Post-Op Procedure(s) (LRB): Left Heart Cath and Coronary Angiography (N/A) Subjective: No complaints  Objective: Vital signs in last 24 hours: Temp:  [98.3 F (36.8 C)-99.4 F (37.4 C)] 98.3 F (36.8 C) (06/17 0500) Pulse Rate:  [62-72] 72 (06/17 0950) Cardiac Rhythm:  [-] Normal sinus rhythm (06/17 0750) Resp:  [14-18] 14 (06/17 0500) BP: (104-123)/(62-72) 108/62 mmHg (06/17 0950) SpO2:  [97 %-100 %] 100 % (06/17 0500) Weight:  [185 lb 11.2 oz (84.233 kg)] 185 lb 11.2 oz (84.233 kg) (06/17 0500)  Hemodynamic parameters for last 24 hours:    Intake/Output from previous day: 06/16 0701 - 06/17 0700 In: 84.5 [I.V.:84.5] Out: 700 [Urine:700] Intake/Output this shift: Total I/O In: 585.9 [P.O.:360; I.V.:225.9] Out: -   General appearance: alert and no distress Neurologic: intact Heart: regular rate and rhythm Lungs: clear to auscultation bilaterally  Lab Results:  Recent Labs  06/18/14 0457 06/19/14 0253  WBC 6.7 7.4  HGB 13.9 14.0  HCT 40.5 42.2  PLT 201 180   BMET:  Recent Labs  06/18/14 0457 06/19/14 0253  NA 140 137  K 3.7 3.8  CL 109 105  CO2 23 23  GLUCOSE 152* 181*  BUN 11 9  CREATININE 0.77 0.79  CALCIUM 9.2 8.9    PT/INR:  Recent Labs  06/18/14 2344  LABPROT 14.7  INR 1.13   ABG No results found for: PHART, HCO3, TCO2, ACIDBASEDEF, O2SAT CBG (last 3)   Recent Labs  06/19/14 0721 06/19/14 1130 06/19/14 1651  GLUCAP 195* 252* 216*    Assessment/Plan: S/P Procedure(s) (LRB): Left Heart Cath and Coronary Angiography (N/A) -  For CABG Monday AM Carotids and PFTs OK All questions answered   LOS: 1 day    Melrose Nakayama 06/19/2014

## 2014-06-19 NOTE — Progress Notes (Signed)
Patient Name: [name] Date of Encounter: 06/19/2014  Principal Problem:   NSTEMI (non-ST elevated myocardial infarction) Active Problems:   DM type 2 (diabetes mellitus, type 2)   ATRIAL FIBRILLATION, PAROXYSMAL   Hypertension   Hyperlipidemia   Length of Stay: 1  SUBJECTIVE  Getting PFTs today. No angina or dyspnea at rest/light activity. NSR. EF 25-35% with multivessel CAD at cath, recent NSTEMI, early RCA restenosis after rotablator RCA in May. CABG planned Monday after antiplatelet therapy washout.   CURRENT MEDS . aspirin EC  81 mg Oral Daily  . Besifloxacin HCl  1 drop Left Eye TID  . clidinium-chlordiazePOXIDE  1 capsule Oral TID AC  . glyBURIDE  5 mg Oral BID WC  . insulin aspart  0-15 Units Subcutaneous TID WC  . insulin aspart  0-5 Units Subcutaneous QHS  . metoprolol succinate  50 mg Oral Daily  . nepafenac  1 drop Left Eye Daily  . ramipril  5 mg Oral Daily  . rosuvastatin  10 mg Oral Q M,W,F  . sodium chloride  3 mL Intravenous Q12H    OBJECTIVE   Intake/Output Summary (Last 24 hours) at 06/19/14 0916 Last data filed at 06/18/14 1639  Gross per 24 hour  Intake      0 ml  Output    700 ml  Net   -700 ml   Filed Weights   06/17/14 1940 06/18/14 0420 06/19/14 0500  Weight: 190 lb (86.183 kg) 192 lb (87.091 kg) 185 lb 11.2 oz (84.233 kg)    PHYSICAL EXAM Filed Vitals:   06/18/14 1656 06/18/14 1710 06/18/14 2145 06/19/14 0500  BP: 131/64 129/72 123/72 104/62  Pulse: 66 67 62   Temp:   99.4 F (37.4 C) 98.3 F (36.8 C)  TempSrc:   Oral Oral  Resp: 12 18 18 14   Height:      Weight:    185 lb 11.2 oz (84.233 kg)  SpO2: 99% 98% 97% 100%   General: Alert, oriented x3, no distress Head: no evidence of trauma, PERRL, EOMI, no exophtalmos or lid lag, no myxedema, no xanthelasma; normal ears, nose and oropharynx Neck: normal jugular venous pulsations and no hepatojugular reflux; brisk carotid pulses without delay and no carotid bruits Chest:  clear to auscultation, no signs of consolidation by percussion or palpation, normal fremitus, symmetrical and full respiratory excursions Cardiovascular: normal position and quality of the apical impulse, regular rhythm, normal first and second heart sounds, no rubs or gallops, no murmur Abdomen: no tenderness or distention, no masses by palpation, no abnormal pulsatility or arterial bruits, normal bowel sounds, no hepatosplenomegaly Extremities: no clubbing, cyanosis or edema; 2+ radial, ulnar and brachial pulses bilaterally; 2+ right femoral, posterior tibial and dorsalis pedis pulses; 2+ left femoral, posterior tibial and dorsalis pedis pulses; no subclavian or femoral bruits Neurological: grossly nonfocal  LABS  CBC  Recent Labs  06/18/14 0457 06/19/14 0253  WBC 6.7 7.4  HGB 13.9 14.0  HCT 40.5 42.2  MCV 88.2 90.4  PLT 201 294   Basic Metabolic Panel  Recent Labs  06/18/14 0457 06/19/14 0253  NA 140 137  K 3.7 3.8  CL 109 105  CO2 23 23  GLUCOSE 152* 181*  BUN 11 9  CREATININE 0.77 0.79  CALCIUM 9.2 8.9  MG 2.0  --    Liver Function Tests No results for input(s): AST, ALT, ALKPHOS, BILITOT, PROT, ALBUMIN in the last 72 hours. No results for input(s): LIPASE, AMYLASE in the last  72 hours. Cardiac Enzymes  Recent Labs  06/18/14 0457 06/18/14 0935 06/18/14 1622  TROPONINI 10.17* 11.28* 8.89*   BNP Invalid input(s): POCBNP D-Dimer No results for input(s): DDIMER in the last 72 hours. Hemoglobin A1C  Recent Labs  06/18/14 0457  HGBA1C 9.1*   Fasting Lipid Panel  Recent Labs  06/18/14 0457  CHOL 160  HDL 39*  LDLCALC 97  TRIG 121  CHOLHDL 4.1   Thyroid Function Tests  Recent Labs  06/18/14 0457  TSH 0.748    Radiology Studies Imaging results have been reviewed and Dg Chest 2 View  06/17/2014   CLINICAL DATA:  Acute onset of central chest pain. Initial encounter.  EXAM: CHEST  2 VIEW  COMPARISON:  Chest radiograph performed 11/22/2007   FINDINGS: The lungs are well-aerated and clear. There is no evidence of focal opacification, pleural effusion or pneumothorax.  The heart is borderline normal in size. No acute osseous abnormalities are seen.  IMPRESSION: No acute cardiopulmonary process seen.   Electronically Signed   By: Garald Balding M.D.   On: 06/17/2014 20:26    TELE NSR  ECG NSR, inferior Q waves  CATH  Prox LAD lesion, 70% stenosed.  1st Diag lesion, 70% stenosed.  Ramus lesion, 60% stenosed.  Prox Cx to Mid Cx lesion, 60% stenosed.  There is severe left ventricular systolic dysfunction.  Mid LAD lesion, 75% stenosed.  1. Severe 3 vessel obstructive CAD  - there is early restenosis of the RCA at site of prior stent.   - there is significant disease involving the proximal to mid LAD. FFR is abnormal at 0.74 indicating that this is hemodynamically significant. 2. Severe LV dysfunction  Recommendation: Given early restenosis in RCA stent and significant disease in a large LAD distribution with severe LV dysfunction I would recommend CABG for complete revascularization. Discussed with Dr. Burt Knack who reviewed films with me. Will stop Brilinta. Resume IV heparin. Given length of stent in RCA I would recommend starting IV tirofiban in am until the time of CABG.   ASSESSMENT AND PLAN  preop w/u this weekend, CABG Monday On heparin IV over the weekend Reassess LVEF by echo post op. May need to consider LifeVest if EF remains under 35%, with final decision re: ICD 90 days after CABG.    Sanda Klein, MD, [facility] [phone] office [phone] pager 06/19/2014 9:16 AM

## 2014-06-19 NOTE — Progress Notes (Signed)
ANTICOAGULATION CONSULT NOTE - FOLLOW UP Consult  Pharmacy Consult for Heparin Indication: AFib/CAD  Allergies  Allergen Reactions  . Lipitor [Atorvastatin] Other (See Comments)    Myopathy Spring 2006  . Pravastatin Other (See Comments)    LEG CRAMPS  . Simvastatin Other (See Comments)    LEG CRAMPS    Patient Measurements: Height: 5\' 8"  (172.7 cm) Weight: 185 lb 11.2 oz (84.233 kg) IBW/kg (Calculated) : 68.4 Heparin Dosing Weight: 86 kg  Vital Signs: BP: 108/62 mmHg (06/17 0950) Pulse Rate: 72 (06/17 0950)  Labs:  Recent Labs  06/17/14 1952  06/18/14 0457  06/18/14 0935 06/18/14 1622 06/18/14 2344 06/19/14 0253 06/19/14 1037 06/19/14 1923  HGB 13.9  --  13.9  --   --   --   --  14.0  --   --   HCT 41.4  --  40.5  --   --   --   --  42.2  --   --   PLT 204  --  201  --   --   --   --  180  --   --   APTT  --   --   --   --   --   --  48*  --   --   --   LABPROT  --   --  15.1  --   --   --  14.7  --   --   --   INR  --   --  1.18  --   --   --  1.13  --   --   --   HEPARINUNFRC  --   --   --   < >  --   --   --  0.12* 0.21* 0.33  CREATININE 0.85  --  0.77  --   --   --   --  0.79  --   --   TROPONINI  --   < > 10.17*  --  11.28* 8.89*  --   --   --   --   < > = values in this interval not displayed.  Estimated Creatinine Clearance: 92.1 mL/min (by C-G formula based on Cr of 0.79).   Medical History: Past Medical History  Diagnosis Date  . Hypertension   . Elevated CPK     CPK elevated with normal MB and normal troponin the past  . Chest pain     Nuclear, 2006, no ischemia  . Atrial fibrillation     Paroxysmal, rare episodes, sinus rhythm on flecainide, patient prefers not to take Coumadin  . Arthritis     Right shoulder  . Ejection fraction     EF 55-60%,  echo, January, 2011  . Bradycardia     April, 2013  . Coronary artery disease     s/p staged cath 05/12/2014 and 5/11, DES x 2 to heavily calcified RCA, residual with 60% prox LAD, 70% D1  .  Sinus drainage   . Diabetes mellitus without complication     type 2  . IBS (irritable bowel syndrome)     Medications:  See electronic med rec  Assessment: 69 y.o. male presented with CP - coronary stents placed in May. Has been on IV heparin since post cath. Plan is for CABG on Monday.  PM heparin level therapeutic  Goal of Therapy:  Heparin level 0.3-0.7 units/ml Monitor platelets by anticoagulation protocol: Yes   Plan:  Continue heparin at 1650 units/hr  Daily level and CBC  Thank you. Anette Guarneri, PharmD [phone]  06/19/2014 7:59 PM

## 2014-06-19 NOTE — Progress Notes (Signed)
ANTICOAGULATION CONSULT NOTE - Follow Up Consult  Pharmacy Consult for heparin Indication: CAD awaiting CABG   Labs:  Recent Labs  06/17/14 1952  06/18/14 0457 06/18/14 0901 06/18/14 0935 06/18/14 1622 06/18/14 2344 06/19/14 0253  HGB 13.9  --  13.9  --   --   --   --  14.0  HCT 41.4  --  40.5  --   --   --   --  42.2  PLT 204  --  201  --   --   --   --  180  APTT  --   --   --   --   --   --  48*  --   LABPROT  --   --  15.1  --   --   --  14.7  --   INR  --   --  1.18  --   --   --  1.13  --   HEPARINUNFRC  --   --   --  0.33  --   --   --  0.12*  CREATININE 0.85  --  0.77  --   --   --   --   --   TROPONINI  --   < > 10.17*  --  11.28* 8.89*  --   --   < > = values in this interval not displayed.    Assessment: 69yo male subtherapeutic on heparin after resuming post-cath; had previously been at low end of goal at current rate.  Goal of Therapy:  Heparin level 0.3-0.7 units/ml   Plan:  Will increase heparin gtt slightly to 1500 units/hr and check level in 6hr.  Wynona Neat, PharmD, BCPS  06/19/2014,4:07 AM

## 2014-06-19 NOTE — Progress Notes (Signed)
PT Cancellation Note  Patient Details Name: [name] MRN: [id number] DOB: June 11, 1945   Cancelled Treatment:    Reason Eval/Treat Not Completed: PT screened, no needs identified, will sign off. Per cardiac rehab note pt is independent with amb and they educated pt on sternal precautions and post op mobility. No PT needs at this time.   Mckenzy Salazar 06/19/2014, 2:14 PM  [facility] PT [phone]

## 2014-06-19 NOTE — Progress Notes (Signed)
CARDIAC REHAB PHASE I   PRE:  Rate/Rhythm: 23 SR    BP: sitting 106/57    SaO2:   MODE:  Ambulation: 400 ft   POST:  Rate/Rhythm: 72 SR    BP: sitting 137/69     SaO2:   Tolerated well, denied CP. Feels good. Discussed sternal precautions, post op mobility, IS (inspiring > 2500 ml) and d/c plan. Gave OHS book and guideline and instructions to watch preop video. Pt sts he and his family have plans for a beach trip 6/30 and is curious if he could go, to discuss with MD. Can walk independently or with family/staff. [phone]   Josephina Shih Hoyt CES, ACSM 06/19/2014 12:17 PM

## 2014-06-19 NOTE — Progress Notes (Signed)
Inpatient Diabetes Program Recommendations  AACE/ADA: New Consensus Statement on Inpatient Glycemic Control (2013)  Target Ranges:  Prepandial:   less than 140 mg/dL      Peak postprandial:   less than 180 mg/dL (1-2 hours)      Critically ill patients:  140 - 180 mg/dL   Results for [name], [name] (MRN [id number]) as of 06/19/2014 12:14  Ref. Range 06/18/2014 12:32 06/18/2014 16:22 06/18/2014 21:44 06/19/2014 07:21 06/19/2014 11:30  Glucose-Capillary Latest Ref Range: 65-99 mg/dL 153 (H) 249 (H) 174 (H) 195 (H) 252 (H)  Results for [name], [name] (MRN [id number]) as of 06/19/2014 12:14  Ref. Range 06/18/2014 04:57  Hemoglobin A1C Latest Ref Range: 4.8-5.6 % 9.1 (H)   Diabetes history: Type 2 diabetes Outpatient Diabetes medications: Glucovance 5-500 mg daily Current orders for Inpatient glycemic control:   Novolog moderate tid with meals and HS    Please consider adding Levemir 16 units daily.  Thanks, Adah Perl, RN, BC-ADM Inpatient Diabetes Coordinator Pager [phone] (8a-5p)

## 2014-06-20 LAB — GLUCOSE, CAPILLARY
GLUCOSE-CAPILLARY: 204 mg/dL — AB (ref 65–99)
Glucose-Capillary: 176 mg/dL — ABNORMAL HIGH (ref 65–99)
Glucose-Capillary: 236 mg/dL — ABNORMAL HIGH (ref 65–99)
Glucose-Capillary: 130 mg/dL — ABNORMAL HIGH (ref 65–99)

## 2014-06-20 LAB — HEPARIN LEVEL (UNFRACTIONATED): Heparin Unfractionated: 0.41 IU/mL (ref 0.30–0.70)

## 2014-06-20 LAB — CBC
HCT: 39.8 % (ref 39.0–52.0)
HEMOGLOBIN: 13.3 g/dL (ref 13.0–17.0)
MCH: 29.9 pg (ref 26.0–34.0)
MCHC: 33.4 g/dL (ref 30.0–36.0)
MCV: 89.4 fL (ref 78.0–100.0)
PLATELETS: 177 10*3/uL (ref 150–400)
RBC: 4.45 MIL/uL (ref 4.22–5.81)
RDW: 13.1 % (ref 11.5–15.5)
WBC: 6.6 10*3/uL (ref 4.0–10.5)

## 2014-06-20 MED ORDER — GUAIFENESIN 100 MG/5ML PO SOLN
200.0000 mg | ORAL | Status: DC | PRN
  Administered 2014-06-20 – 2014-06-22 (×2): 200 mg via ORAL
  Filled 2014-06-20 (×3): qty 10

## 2014-06-20 MED ORDER — MENTHOL 3 MG MT LOZG
1.0000 | LOZENGE | OROMUCOSAL | Status: DC | PRN
  Administered 2014-06-20 (×2): 3 mg via ORAL
  Filled 2014-06-20 (×2): qty 9

## 2014-06-20 NOTE — Progress Notes (Signed)
CARDIAC REHAB PHASE I   PRE:  Rate/Rhythm: 61 sinus  BP:  Supine:   Sitting: 124/64  Standing:    SaO2:   MODE:  Ambulation: 550 ft   POST:  Rate/Rhythem: 70  BP:  Supine:   Sitting: 124/60  Standing:    SaO2: 98% RA  Pt ambulated 550 ft with assist x1.  Pt has been walking independently as well.  Pt had no f/u question for surgery on Monday.  He plans to continue to walk independently and with staff.  We will f/u with pt after surgery. Alberteen Sam, MA, ACSM RCEP [phone]  Clotilde Dieter

## 2014-06-20 NOTE — Progress Notes (Signed)
Patient Name: [name]      SUBJECTIVE: Admitted with non-STEMI. Catheterization demonstrated multivessel disease and ejection fraction of 25-35% with early RCA restenosis following Rotablator 5/16. Bypass surgery anticipated Monday. He has a history of paroxysmal atrial fibrillation and diabetes    No chest pain.   Complaining of a sore throat  Past Medical History  Diagnosis Date  . Hypertension   . Elevated CPK     CPK elevated with normal MB and normal troponin the past  . Chest pain     Nuclear, 2006, no ischemia  . Atrial fibrillation     Paroxysmal, rare episodes, sinus rhythm on flecainide, patient prefers not to take Coumadin  . Arthritis     Right shoulder  . Ejection fraction     EF 55-60%,  echo, January, 2011  . Bradycardia     April, 2013  . Coronary artery disease     s/p staged cath 05/12/2014 and 5/11, DES x 2 to heavily calcified RCA, residual with 60% prox LAD, 70% D1  . Sinus drainage   . Diabetes mellitus without complication     type 2  . IBS (irritable bowel syndrome)     Scheduled Meds:  Scheduled Meds: . aspirin EC  81 mg Oral Daily  . Besifloxacin HCl  1 drop Left Eye TID  . clidinium-chlordiazePOXIDE  1 capsule Oral TID AC  . glyBURIDE  5 mg Oral BID WC  . insulin aspart  0-15 Units Subcutaneous TID WC  . insulin aspart  0-5 Units Subcutaneous QHS  . metoprolol succinate  50 mg Oral Daily  . nepafenac  1 drop Left Eye Daily  . ramipril  5 mg Oral Daily  . rosuvastatin  10 mg Oral Q M,W,F  . sodium chloride  3 mL Intravenous Q12H   Continuous Infusions: . heparin 1,650 Units/hr (06/20/14 0139)   sodium chloride, ALPRAZolam, Difluprednate, fluticasone, nitroGLYCERIN, nitroGLYCERIN, ondansetron (ZOFRAN) IV, sodium chloride, tamsulosin  BP 101/45 mmHg  Pulse 63  Temp(Src) 97.8 F (36.6 C) (Oral)  Resp 18  Ht 5' 8"  (1.727 m)  Wt 185 lb 3.2 oz (84.006 kg)  BMI 28.17 kg/m2  SpO2 100%   PHYSICAL EXAM Well developed  and nourished in no acute distress HENT normal Neck supple with JVP-flat Clear Regular rate and rhythm, no murmurs or gallops Abd-soft with active BS No Clubbing cyanosis edema Skin-warm and dry A & Oriented  Grossly normal sensory and motor function   TELEMETRY: Reviewed telemetry pt in nsr     Intake/Output Summary (Last 24 hours) at 06/20/14 0912 Last data filed at 06/20/14 0800  Gross per 24 hour  Intake 1287.57 ml  Output      0 ml  Net 1287.57 ml    LABS: Basic Metabolic Panel:  Recent Labs Lab 06/17/14 1952 06/18/14 0457 06/19/14 0253  NA 139 140 137  K 4.6 3.7 3.8  CL 106 109 105  CO2 25 23 23   GLUCOSE 141* 152* 181*  BUN 14 11 9   CREATININE 0.85 0.77 0.79  CALCIUM 9.7 9.2 8.9  MG  --  2.0  --    Cardiac Enzymes:  Recent Labs  06/18/14 0457 06/18/14 0935 06/18/14 1622  TROPONINI 10.17* 11.28* 8.89*   CBC:  Recent Labs Lab 06/17/14 1952 06/18/14 0457 06/19/14 0253 06/20/14 0323  WBC 5.2 6.7 7.4 6.6  HGB 13.9 13.9 14.0 13.3  HCT 41.4 40.5 42.2 39.8  MCV 89.8 88.2 90.4 89.4  PLT 204 201 180 177   PROTIME:  Recent Labs  06/18/14 0457 06/18/14 2344  LABPROT 15.1 14.7  INR 1.18 1.13   Liver Function Tests: No results for input(s): AST, ALT, ALKPHOS, BILITOT, PROT, ALBUMIN in the last 72 hours. No results for input(s): LIPASE, AMYLASE in the last 72 hours. BNP: BNP (last 3 results)  Recent Labs  06/17/14 1952 06/18/14 0457  BNP 152.4* 246.3*    ProBNP (last 3 results) No results for input(s): PROBNP in the last 8760 hours.  D-Dimer: No results for input(s): DDIMER in the last 72 hours. Hemoglobin A1C:  Recent Labs  06/18/14 0457  HGBA1C 9.1*   Fasting Lipid Panel:  Recent Labs  06/18/14 0457  CHOL 160  HDL 39*  LDLCALC 97  TRIG 121  CHOLHDL 4.1   Thyroid Function Tests:  Recent Labs  06/18/14 0457  TSH 0.748      ASSESSMENT AND PLAN:  Principal Problem:   NSTEMI (non-ST elevated myocardial  infarction) Active Problems:   DM type 2 (diabetes mellitus, type 2)   ATRIAL FIBRILLATION, PAROXYSMAL   Hypertension   Hyperlipidemia   Atherosclerosis of native coronary artery of native heart with angina pectoris with documented spasm   LV dysfunction   Cardiomyopathy, ischemic  Currently without chest pain. Is complaining of sore throat. Use lozenges. Dr. Mariana Kaufman by a couple of times. There questions have been answered.  Signed, Virl Axe MD  06/20/2014

## 2014-06-20 NOTE — Progress Notes (Signed)
ANTICOAGULATION CONSULT NOTE - FOLLOW UP Consult  Pharmacy Consult for Heparin Indication: AFib/CAD  Allergies  Allergen Reactions  . Lipitor [Atorvastatin] Other (See Comments)    Myopathy Spring 2006  . Pravastatin Other (See Comments)    LEG CRAMPS  . Simvastatin Other (See Comments)    LEG CRAMPS    Patient Measurements: Height: 5\' 8"  (172.7 cm) Weight: 185 lb 3.2 oz (84.006 kg) IBW/kg (Calculated) : 68.4 Heparin Dosing Weight: 86 kg  Vital Signs: Temp: 97.8 F (36.6 C) (06/18 0500) Temp Source: Oral (06/18 0500) BP: 101/45 mmHg (06/18 0500) Pulse Rate: 63 (06/18 0500)  Labs:  Recent Labs  06/17/14 1952  06/18/14 0457  06/18/14 0935 06/18/14 1622 06/18/14 2344 06/19/14 0253 06/19/14 1037 06/19/14 1923 06/20/14 0323  HGB 13.9  --  13.9  --   --   --   --  14.0  --   --  13.3  HCT 41.4  --  40.5  --   --   --   --  42.2  --   --  39.8  PLT 204  --  201  --   --   --   --  180  --   --  177  APTT  --   --   --   --   --   --  48*  --   --   --   --   LABPROT  --   --  15.1  --   --   --  14.7  --   --   --   --   INR  --   --  1.18  --   --   --  1.13  --   --   --   --   HEPARINUNFRC  --   --   --   < >  --   --   --  0.12* 0.21* 0.33 0.41  CREATININE 0.85  --  0.77  --   --   --   --  0.79  --   --   --   TROPONINI  --   < > 10.17*  --  11.28* 8.89*  --   --   --   --   --   < > = values in this interval not displayed.  Estimated Creatinine Clearance: 92 mL/min (by C-G formula based on Cr of 0.79).   Medical History: Past Medical History  Diagnosis Date  . Hypertension   . Elevated CPK     CPK elevated with normal MB and normal troponin the past  . Chest pain     Nuclear, 2006, no ischemia  . Atrial fibrillation     Paroxysmal, rare episodes, sinus rhythm on flecainide, patient prefers not to take Coumadin  . Arthritis     Right shoulder  . Ejection fraction     EF 55-60%,  echo, January, 2011  . Bradycardia     April, 2013  . Coronary artery  disease     s/p staged cath 05/12/2014 and 5/11, DES x 2 to heavily calcified RCA, residual with 60% prox LAD, 70% D1  . Sinus drainage   . Diabetes mellitus without complication     type 2  . IBS (irritable bowel syndrome)     Medications:  See electronic med rec  Assessment: 69 y.o. male presented with CP - coronary stents placed in May. Has been on IV heparin since post cath. Plan is  for CABG on Monday.  AM heparin level therapeutic on heparin infusion at 1650 units/hr. H/H and Plt wnl. Patient reports no s/s of bleeding.   Goal of Therapy:  Heparin level 0.3-0.7 units/ml Monitor platelets by anticoagulation protocol: Yes   Plan:  Continue heparin at 1650 units/hr Daily level and CBC  Albertina Parr, PharmD., BCPS Clinical Pharmacist Pager [phone]

## 2014-06-21 LAB — GLUCOSE, CAPILLARY
GLUCOSE-CAPILLARY: 179 mg/dL — AB (ref 65–99)
Glucose-Capillary: 181 mg/dL — ABNORMAL HIGH (ref 65–99)
Glucose-Capillary: 167 mg/dL — ABNORMAL HIGH (ref 65–99)
Glucose-Capillary: 143 mg/dL — ABNORMAL HIGH (ref 65–99)

## 2014-06-21 LAB — TYPE AND SCREEN
ABO/RH(D): A POS
ANTIBODY SCREEN: NEGATIVE

## 2014-06-21 LAB — BLOOD GAS, ARTERIAL
ACID-BASE DEFICIT: 1.7 mmol/L (ref 0.0–2.0)
Bicarbonate: 22.4 mEq/L (ref 20.0–24.0)
Drawn by: 257701
FIO2: 0.21 %
O2 Saturation: 96.9 %
PATIENT TEMPERATURE: 98.6
PH ART: 7.402 (ref 7.350–7.450)
TCO2: 23.5 mmol/L (ref 0–100)
pCO2 arterial: 36.7 mmHg (ref 35.0–45.0)
pO2, Arterial: 83.1 mmHg (ref 80.0–100.0)

## 2014-06-21 LAB — COMPREHENSIVE METABOLIC PANEL
ALK PHOS: 52 U/L (ref 38–126)
ALT: 41 U/L (ref 17–63)
ANION GAP: 7 (ref 5–15)
AST: 27 U/L (ref 15–41)
Albumin: 3.2 g/dL — ABNORMAL LOW (ref 3.5–5.0)
BILIRUBIN TOTAL: 0.6 mg/dL (ref 0.3–1.2)
BUN: 12 mg/dL (ref 6–20)
CO2: 25 mmol/L (ref 22–32)
CREATININE: 0.86 mg/dL (ref 0.61–1.24)
Calcium: 8.9 mg/dL (ref 8.9–10.3)
Chloride: 107 mmol/L (ref 101–111)
GFR calc Af Amer: >60 mL/min (ref >60)
GFR calc non Af Amer: >60 mL/min (ref >60)
GLUCOSE: 191 mg/dL — AB (ref 65–99)
POTASSIUM: 3.7 mmol/L (ref 3.5–5.1)
SODIUM: 139 mmol/L (ref 135–145)
TOTAL PROTEIN: 6.2 g/dL — AB (ref 6.5–8.1)

## 2014-06-21 LAB — CBC
HEMATOCRIT: 39.7 % (ref 39.0–52.0)
Hemoglobin: 13.3 g/dL (ref 13.0–17.0)
MCH: 29.9 pg (ref 26.0–34.0)
MCHC: 33.5 g/dL (ref 30.0–36.0)
MCV: 89.2 fL (ref 78.0–100.0)
PLATELETS: 188 10*3/uL (ref 150–400)
RBC: 4.45 MIL/uL (ref 4.22–5.81)
RDW: 13.1 % (ref 11.5–15.5)
WBC: 6.6 10*3/uL (ref 4.0–10.5)

## 2014-06-21 LAB — HEPARIN LEVEL (UNFRACTIONATED)
Heparin Unfractionated: 0.28 IU/mL — ABNORMAL LOW (ref 0.30–0.70)
Heparin Unfractionated: 0.37 IU/mL (ref 0.30–0.70)
Heparin Unfractionated: 0.34 IU/mL (ref 0.30–0.70)

## 2014-06-21 LAB — SURGICAL PCR SCREEN
MRSA, PCR: NEGATIVE
Staphylococcus aureus: NEGATIVE

## 2014-06-21 MED ORDER — NITROGLYCERIN IN D5W 200-5 MCG/ML-% IV SOLN
2.0000 ug/min | INTRAVENOUS | Status: AC
  Administered 2014-06-22: 5 ug/min via INTRAVENOUS
  Filled 2014-06-21: qty 250

## 2014-06-21 MED ORDER — DEXTROSE 5 % IV SOLN
1.5000 g | INTRAVENOUS | Status: AC
  Administered 2014-06-22: .75 g via INTRAVENOUS
  Administered 2014-06-22: 1.5 g via INTRAVENOUS
  Filled 2014-06-21: qty 1.5

## 2014-06-21 MED ORDER — CHLORHEXIDINE GLUCONATE 4 % EX LIQD
60.0000 mL | Freq: Once | CUTANEOUS | Status: AC
  Administered 2014-06-22: 4 via TOPICAL
  Filled 2014-06-21: qty 60

## 2014-06-21 MED ORDER — VANCOMYCIN HCL 10 G IV SOLR
1500.0000 mg | INTRAVENOUS | Status: AC
  Administered 2014-06-22: 1500 mg via INTRAVENOUS
  Filled 2014-06-21: qty 1500

## 2014-06-21 MED ORDER — DIAZEPAM 5 MG PO TABS
5.0000 mg | ORAL_TABLET | Freq: Once | ORAL | Status: AC
  Administered 2014-06-22: 5 mg via ORAL
  Filled 2014-06-21: qty 1

## 2014-06-21 MED ORDER — POTASSIUM CHLORIDE 2 MEQ/ML IV SOLN
80.0000 meq | INTRAVENOUS | Status: DC
  Filled 2014-06-21: qty 40

## 2014-06-21 MED ORDER — SODIUM CHLORIDE 0.9 % IV SOLN
INTRAVENOUS | Status: DC
  Filled 2014-06-21: qty 30

## 2014-06-21 MED ORDER — DEXMEDETOMIDINE HCL IN NACL 400 MCG/100ML IV SOLN
0.1000 ug/kg/h | INTRAVENOUS | Status: AC
  Administered 2014-06-22: .2 ug/kg/h via INTRAVENOUS
  Filled 2014-06-21: qty 100

## 2014-06-21 MED ORDER — DEXTROSE 5 % IV SOLN
30.0000 ug/min | INTRAVENOUS | Status: DC
  Filled 2014-06-21: qty 2

## 2014-06-21 MED ORDER — SODIUM CHLORIDE 0.9 % IV SOLN
INTRAVENOUS | Status: AC
  Administered 2014-06-22: 2.7 [IU]/h via INTRAVENOUS
  Filled 2014-06-21: qty 2.5

## 2014-06-21 MED ORDER — PLASMA-LYTE 148 IV SOLN
INTRAVENOUS | Status: AC
  Administered 2014-06-22: 500 mL
  Filled 2014-06-21: qty 2.5

## 2014-06-21 MED ORDER — METOPROLOL TARTRATE 12.5 MG HALF TABLET
12.5000 mg | ORAL_TABLET | Freq: Once | ORAL | Status: AC
  Administered 2014-06-22: 12.5 mg via ORAL
  Filled 2014-06-21 (×2): qty 1

## 2014-06-21 MED ORDER — SODIUM CHLORIDE 0.9 % IV SOLN
INTRAVENOUS | Status: AC
  Administered 2014-06-22: 10:00:00 via INTRAVENOUS
  Administered 2014-06-22: 69.8 mL/h via INTRAVENOUS
  Filled 2014-06-21: qty 40

## 2014-06-21 MED ORDER — DOPAMINE-DEXTROSE 3.2-5 MG/ML-% IV SOLN
0.0000 ug/kg/min | INTRAVENOUS | Status: AC
  Administered 2014-06-22: 3 ug/kg/min via INTRAVENOUS
  Filled 2014-06-21: qty 250

## 2014-06-21 MED ORDER — CHLORHEXIDINE GLUCONATE 4 % EX LIQD
60.0000 mL | Freq: Once | CUTANEOUS | Status: AC
  Administered 2014-06-21: 4 via TOPICAL
  Filled 2014-06-21: qty 60

## 2014-06-21 MED ORDER — EPINEPHRINE HCL 1 MG/ML IJ SOLN
0.0000 ug/min | INTRAVENOUS | Status: DC
  Filled 2014-06-21: qty 4

## 2014-06-21 MED ORDER — BISACODYL 5 MG PO TBEC
5.0000 mg | DELAYED_RELEASE_TABLET | Freq: Once | ORAL | Status: AC
  Administered 2014-06-21: 5 mg via ORAL
  Filled 2014-06-21: qty 1

## 2014-06-21 MED ORDER — MAGNESIUM SULFATE 50 % IJ SOLN
40.0000 meq | INTRAMUSCULAR | Status: DC
  Filled 2014-06-21: qty 10

## 2014-06-21 MED ORDER — TEMAZEPAM 15 MG PO CAPS: 15.0000 mg | ORAL_CAPSULE | Freq: Once | ORAL | Status: AC | PRN

## 2014-06-21 MED ORDER — DEXTROSE 5 % IV SOLN
750.0000 mg | INTRAVENOUS | Status: DC
  Filled 2014-06-21: qty 750

## 2014-06-21 NOTE — Progress Notes (Signed)
Patient Name: [name] Date of Encounter: 06/21/2014  Primary Cardiologist: Dr. Ron Parker   Principal Problem:   NSTEMI (non-ST elevated myocardial infarction) Active Problems:   DM type 2 (diabetes mellitus, type 2)   ATRIAL FIBRILLATION, PAROXYSMAL   Hypertension   Hyperlipidemia   Atherosclerosis of native coronary artery of native heart with angina pectoris with documented spasm   LV dysfunction   Cardiomyopathy, ischemic    SUBJECTIVE  Sore throat better, denies any CP since initial admission. No SOB.  CURRENT MEDS . aspirin EC  81 mg Oral Daily  . Besifloxacin HCl  1 drop Left Eye TID  . chlorhexidine  60 mL Topical Once   And  . [START ON 06/22/2014] chlorhexidine  60 mL Topical Once  . clidinium-chlordiazePOXIDE  1 capsule Oral TID AC  . [START ON 06/22/2014] diazepam  5 mg Oral Once  . glyBURIDE  5 mg Oral BID WC  . insulin aspart  0-15 Units Subcutaneous TID WC  . insulin aspart  0-5 Units Subcutaneous QHS  . metoprolol succinate  50 mg Oral Daily  . [START ON 06/22/2014] metoprolol tartrate  12.5 mg Oral Once  . nepafenac  1 drop Left Eye Daily  . ramipril  5 mg Oral Daily  . rosuvastatin  10 mg Oral Q M,W,F  . sodium chloride  3 mL Intravenous Q12H    OBJECTIVE  Filed Vitals:   06/20/14 0500 06/20/14 1400 06/20/14 2030 06/21/14 0500  BP: 101/45 116/68 120/69 98/48  Pulse: 63 53 57 52  Temp: 97.8 F (36.6 C) 98.2 F (36.8 C) 97.6 F (36.4 C) 98.7 F (37.1 C)  TempSrc: Oral Oral    Resp: 18 16 16 12   Height:      Weight: 185 lb 3.2 oz (84.006 kg)   186 lb 3.2 oz (84.46 kg)  SpO2: 100% 100% 98% 99%   No intake or output data in the 24 hours ending 06/21/14 0826 Filed Weights   06/19/14 0500 06/20/14 0500 06/21/14 0500  Weight: 185 lb 11.2 oz (84.233 kg) 185 lb 3.2 oz (84.006 kg) 186 lb 3.2 oz (84.46 kg)    PHYSICAL EXAM  General: Pleasant, NAD. Neuro: Alert and oriented X 3. Moves all extremities spontaneously. Psych: Normal affect. HEENT:   Normal  Neck: Supple without bruits or JVD. Lungs:  Resp regular and unlabored, CTA. Heart: RRR no s3, s4, or murmurs. Abdomen: Soft, non-tender, non-distended, BS + x 4.  Extremities: No clubbing, cyanosis or edema. DP/PT/Radials 2+ and equal bilaterally.  Accessory Clinical Findings  CBC  Recent Labs  06/20/14 0323 06/21/14 0516  WBC 6.6 6.6  HGB 13.3 13.3  HCT 39.8 39.7  MCV 89.4 89.2  PLT 177 321   Basic Metabolic Panel  Recent Labs  06/19/14 0253 06/21/14 0516  NA 137 139  K 3.8 3.7  CL 105 107  CO2 23 25  GLUCOSE 181* 191*  BUN 9 12  CREATININE 0.79 0.86  CALCIUM 8.9 8.9   Liver Function Tests  Recent Labs  06/21/14 0516  AST 27  ALT 41  ALKPHOS 52  BILITOT 0.6  PROT 6.2*  ALBUMIN 3.2*   Cardiac Enzymes  Recent Labs  06/18/14 0935 06/18/14 1622  TROPONINI 11.28* 8.89*    TELE NSR with HR 60s, occasional HR 40s    ECG  No new EKG  Echocardiogram 2011  - Left ventricle: The cavity size was normal. Wall thickness was  normal. Systolic function was normal. The estimated ejection  fraction was in the range of 55% to 60%. Wall motion was normal;  there were no regional wall motion abnormalities. Doppler  parameters are consistent with abnormal left ventricular  relaxation (grade 1 diastolic dysfunction). - Mitral valve: Mildly calcified annulus. - Left atrium: The atrium was mildly dilated. - Pulmonary arteries: Systolic pressure was mildly increased.    Radiology/Studies  Dg Chest 2 View  06/17/2014   CLINICAL DATA:  Acute onset of central chest pain. Initial encounter.  EXAM: CHEST  2 VIEW  COMPARISON:  Chest radiograph performed 11/22/2007  FINDINGS: The lungs are well-aerated and clear. There is no evidence of focal opacification, pleural effusion or pneumothorax.  The heart is borderline normal in size. No acute osseous abnormalities are seen.  IMPRESSION: No acute cardiopulmonary process seen.   Electronically  Signed   By: Garald Balding M.D.   On: 06/17/2014 20:26    ASSESSMENT AND PLAN  1. NSTEMI  - cath showed EF 25-35%, multivessel CAD, early RCA restenosis after rotablator RCA in May 2016, pending CABG 06/22/2014 after Brilinta washout  2. CAD  - LHC 05/12/14 for positive stress echo with hypokinesis of inferior lateral wall that demonstrated moderate CAD in LAD with proximal 60% and mid 50% stenosis, 70% D1 stenosis and high-grade proximal 90% RCA and mid 95% RCA stenoses with RCA heavily calcified. EF 40%  - PCI on 05/13/14 with high-speed rotational atherectomy and DES placement to the RCA 2  3. ICM with EF 25-35% (previously 40% on cath May 2016) 4. PAF(deferred warfarin) 5. HTN 6. HLD 7. DM   Signed, Almyra Deforest PA-C Pager: [phone]

## 2014-06-21 NOTE — Progress Notes (Signed)
ANTICOAGULATION CONSULT NOTE - FOLLOW UP Consult  Pharmacy Consult for Heparin Indication: AFib/CAD; for CABG on Monday   Allergies  Allergen Reactions  . Lipitor [Atorvastatin] Other (See Comments)    Myopathy Spring 2006  . Pravastatin Other (See Comments)    LEG CRAMPS  . Simvastatin Other (See Comments)    LEG CRAMPS    Patient Measurements: Height: 5\' 8"  (172.7 cm) Weight: 186 lb 3.2 oz (84.46 kg) IBW/kg (Calculated) : 68.4 Heparin Dosing Weight: 86 kg  Vital Signs:    Labs:  Recent Labs  06/18/14 2344  06/19/14 0253  06/20/14 0323 06/21/14 0516 06/21/14 1236 06/21/14 1849  HGB  --   < > 14.0  --  13.3 13.3  --   --   HCT  --   --  42.2  --  39.8 39.7  --   --   PLT  --   --  180  --  177 188  --   --   APTT 48*  --   --   --   --   --   --   --   LABPROT 14.7  --   --   --   --   --   --   --   INR 1.13  --   --   --   --   --   --   --   HEPARINUNFRC  --   --  0.12*  < > 0.41 0.28* 0.37 0.34  CREATININE  --   --  0.79  --   --  0.86  --   --   < > = values in this interval not displayed.  Estimated Creatinine Clearance: 85.8 mL/min (by C-G formula based on Cr of 0.86).   Medical History: Past Medical History  Diagnosis Date  . Hypertension   . Elevated CPK     CPK elevated with normal MB and normal troponin the past  . Chest pain     Nuclear, 2006, no ischemia  . Atrial fibrillation     Paroxysmal, rare episodes, sinus rhythm on flecainide, patient prefers not to take Coumadin  . Arthritis     Right shoulder  . Ejection fraction     EF 55-60%,  echo, January, 2011  . Bradycardia     April, 2013  . Coronary artery disease     s/p staged cath 05/12/2014 and 5/11, DES x 2 to heavily calcified RCA, residual with 60% prox LAD, 70% D1  . Sinus drainage   . Diabetes mellitus without complication     type 2  . IBS (irritable bowel syndrome)     Medications:  See electronic med rec  Assessment: 69 y.o. male presented with CP - coronary stents  placed in May. Has been on IV heparin since post cath. Plan is for CABG on Monday.  Heparin level therapeutic on heparin infusion at 1750 units/hr. H/H and Plt wnl. Patient reports no s/s of bleeding.   Confirmatory HL remains therapeutic at 0.34 on heparin 1750 units/hr. No issues with infusion or bleeding are noted.  Goal of Therapy:  Heparin level 0.3-0.7 units/ml Monitor platelets by anticoagulation protocol: Yes   Plan:  Continue heparin at 1750 units/hr Daily level and CBC  Andrey Cota. Diona Foley, PharmD Clinical Pharmacist Pager [phone]

## 2014-06-21 NOTE — Progress Notes (Signed)
ewrroer

## 2014-06-21 NOTE — Progress Notes (Signed)
ANTICOAGULATION CONSULT NOTE - FOLLOW UP Consult  Pharmacy Consult for Heparin Indication: AFib/CAD; for CABG on Monday   Allergies  Allergen Reactions  . Lipitor [Atorvastatin] Other (See Comments)    Myopathy Spring 2006  . Pravastatin Other (See Comments)    LEG CRAMPS  . Simvastatin Other (See Comments)    LEG CRAMPS    Patient Measurements: Height: 5\' 8"  (172.7 cm) Weight: 186 lb 3.2 oz (84.46 kg) IBW/kg (Calculated) : 68.4 Heparin Dosing Weight: 86 kg  Vital Signs: Temp: 98.7 F (37.1 C) (06/19 0500) BP: 98/48 mmHg (06/19 0500) Pulse Rate: 52 (06/19 0500)  Labs:  Recent Labs  06/18/14 1622 06/18/14 2344  06/19/14 0253  06/20/14 0323 06/21/14 0516 06/21/14 1236  HGB  --   --   < > 14.0  --  13.3 13.3  --   HCT  --   --   --  42.2  --  39.8 39.7  --   PLT  --   --   --  180  --  177 188  --   APTT  --  48*  --   --   --   --   --   --   LABPROT  --  14.7  --   --   --   --   --   --   INR  --  1.13  --   --   --   --   --   --   HEPARINUNFRC  --   --   --  0.12*  < > 0.41 0.28* 0.37  CREATININE  --   --   --  0.79  --   --  0.86  --   TROPONINI 8.89*  --   --   --   --   --   --   --   < > = values in this interval not displayed.  Estimated Creatinine Clearance: 85.8 mL/min (by C-G formula based on Cr of 0.86).   Medical History: Past Medical History  Diagnosis Date  . Hypertension   . Elevated CPK     CPK elevated with normal MB and normal troponin the past  . Chest pain     Nuclear, 2006, no ischemia  . Atrial fibrillation     Paroxysmal, rare episodes, sinus rhythm on flecainide, patient prefers not to take Coumadin  . Arthritis     Right shoulder  . Ejection fraction     EF 55-60%,  echo, January, 2011  . Bradycardia     April, 2013  . Coronary artery disease     s/p staged cath 05/12/2014 and 5/11, DES x 2 to heavily calcified RCA, residual with 60% prox LAD, 70% D1  . Sinus drainage   . Diabetes mellitus without complication     type 2   . IBS (irritable bowel syndrome)     Medications:  See electronic med rec  Assessment: 69 y.o. male presented with CP - coronary stents placed in May. Has been on IV heparin since post cath. Plan is for CABG on Monday.  Heparin level therapeutic on heparin infusion at 1750 units/hr. H/H and Plt wnl. Patient reports no s/s of bleeding.   Goal of Therapy:  Heparin level 0.3-0.7 units/ml Monitor platelets by anticoagulation protocol: Yes   Plan:  Continue heparin at 1750 units/hr. F/u 6 hour confirmatory HL  Daily level and CBC  Albertina Parr, PharmD., BCPS Clinical Pharmacist Pager [phone]

## 2014-06-21 NOTE — Progress Notes (Signed)
ANTICOAGULATION CONSULT NOTE - Follow Up Consult  Pharmacy Consult for heparin Indication: Afib and CAD awaiting CABG   Labs:  Recent Labs  06/18/14 0935 06/18/14 1622 06/18/14 2344  06/19/14 0253  06/19/14 1923 06/20/14 0323 06/21/14 0516  HGB  --   --   --   < > 14.0  --   --  13.3 13.3  HCT  --   --   --   --  42.2  --   --  39.8 39.7  PLT  --   --   --   --  180  --   --  177 188  APTT  --   --  48*  --   --   --   --   --   --   LABPROT  --   --  14.7  --   --   --   --   --   --   INR  --   --  1.13  --   --   --   --   --   --   HEPARINUNFRC  --   --   --   --  0.12*  < > 0.33 0.41 0.28*  CREATININE  --   --   --   --  0.79  --   --   --   --   TROPONINI 11.28* 8.89*  --   --   --   --   --   --   --   < > = values in this interval not displayed.   Assessment: 69yo male now slightly subtherapeutic on heparin after two levels at goal, no gtt issues per RN.  Goal of Therapy:  Heparin level 0.3-0.7 units/ml   Plan:  Will increase heparin gtt slightly to 1750 units/hr and check level in [place], PharmD, BCPS  06/21/2014,6:18 AM

## 2014-06-22 ENCOUNTER — Inpatient Hospital Stay (HOSPITAL_COMMUNITY): Payer: Medicare Other | Admitting: Anesthesiology

## 2014-06-22 ENCOUNTER — Encounter (HOSPITAL_COMMUNITY)
Admission: EM | Disposition: A | Discharge status: Home / Self Care | Payer: Medicare Other | Source: Home / Self Care | Attending: Thoracic Surgery (Cardiothoracic Vascular Surgery)

## 2014-06-22 ENCOUNTER — Inpatient Hospital Stay (HOSPITAL_COMMUNITY): Payer: Medicare Other

## 2014-06-22 DIAGNOSIS — I25110 Atherosclerotic heart disease of native coronary artery with unstable angina pectoris: Secondary | ICD-10-CM

## 2014-06-22 DIAGNOSIS — I480 Paroxysmal atrial fibrillation: Secondary | ICD-10-CM

## 2014-06-22 HISTORY — PX: TEE WITHOUT CARDIOVERSION: SHX5443

## 2014-06-22 HISTORY — PX: CORONARY ARTERY BYPASS GRAFT: SHX141

## 2014-06-22 HISTORY — PX: MAZE: SHX5063

## 2014-06-22 LAB — POCT I-STAT, CHEM 8
BUN: 16 mg/dL (ref 6–20)
BUN: 14 mg/dL (ref 6–20)
BUN: 14 mg/dL (ref 6–20)
BUN: 14 mg/dL (ref 6–20)
BUN: 16 mg/dL (ref 6–20)
BUN: 15 mg/dL (ref 6–20)
BUN: 12 mg/dL (ref 6–20)
CALCIUM ION: 1.26 mmol/L (ref 1.13–1.30)
CALCIUM ION: 1.13 mmol/L (ref 1.13–1.30)
CALCIUM ION: 1.15 mmol/L (ref 1.13–1.30)
CREATININE: 0.6 mg/dL — AB (ref 0.61–1.24)
CREATININE: 0.6 mg/dL — AB (ref 0.61–1.24)
CREATININE: 0.7 mg/dL (ref 0.61–1.24)
CREATININE: 0.8 mg/dL (ref 0.61–1.24)
Calcium, Ion: 1.22 mmol/L (ref 1.13–1.30)
Calcium, Ion: 1.12 mmol/L — ABNORMAL LOW (ref 1.13–1.30)
Calcium, Ion: 1.15 mmol/L (ref 1.13–1.30)
Calcium, Ion: 1.21 mmol/L (ref 1.13–1.30)
Chloride: 106 mmol/L (ref 101–111)
Chloride: 107 mmol/L (ref 101–111)
Chloride: 104 mmol/L (ref 101–111)
Chloride: 104 mmol/L (ref 101–111)
Chloride: 105 mmol/L (ref 101–111)
Chloride: 106 mmol/L (ref 101–111)
Chloride: 107 mmol/L (ref 101–111)
Creatinine, Ser: 0.6 mg/dL — ABNORMAL LOW (ref 0.61–1.24)
Creatinine, Ser: 0.7 mg/dL (ref 0.61–1.24)
Creatinine, Ser: 0.7 mg/dL (ref 0.61–1.24)
GLUCOSE: 195 mg/dL — AB (ref 65–99)
GLUCOSE: 168 mg/dL — AB (ref 65–99)
GLUCOSE: 182 mg/dL — AB (ref 65–99)
Glucose, Bld: 188 mg/dL — ABNORMAL HIGH (ref 65–99)
Glucose, Bld: 173 mg/dL — ABNORMAL HIGH (ref 65–99)
Glucose, Bld: 227 mg/dL — ABNORMAL HIGH (ref 65–99)
Glucose, Bld: 118 mg/dL — ABNORMAL HIGH (ref 65–99)
HCT: 38 % — ABNORMAL LOW (ref 39.0–52.0)
HCT: 34 % — ABNORMAL LOW (ref 39.0–52.0)
HCT: 29 % — ABNORMAL LOW (ref 39.0–52.0)
HCT: 29 % — ABNORMAL LOW (ref 39.0–52.0)
HCT: 30 % — ABNORMAL LOW (ref 39.0–52.0)
HEMATOCRIT: 31 % — AB (ref 39.0–52.0)
HEMATOCRIT: 36 % — AB (ref 39.0–52.0)
HEMOGLOBIN: 10.5 g/dL — AB (ref 13.0–17.0)
HEMOGLOBIN: 12.2 g/dL — AB (ref 13.0–17.0)
Hemoglobin: 12.9 g/dL — ABNORMAL LOW (ref 13.0–17.0)
Hemoglobin: 11.6 g/dL — ABNORMAL LOW (ref 13.0–17.0)
Hemoglobin: 9.9 g/dL — ABNORMAL LOW (ref 13.0–17.0)
Hemoglobin: 9.9 g/dL — ABNORMAL LOW (ref 13.0–17.0)
Hemoglobin: 10.2 g/dL — ABNORMAL LOW (ref 13.0–17.0)
POTASSIUM: 4.1 mmol/L (ref 3.5–5.1)
POTASSIUM: 3.6 mmol/L (ref 3.5–5.1)
POTASSIUM: 4.2 mmol/L (ref 3.5–5.1)
Potassium: 4 mmol/L (ref 3.5–5.1)
Potassium: 4.3 mmol/L (ref 3.5–5.1)
Potassium: 5.3 mmol/L — ABNORMAL HIGH (ref 3.5–5.1)
Potassium: 4.1 mmol/L (ref 3.5–5.1)
SODIUM: 139 mmol/L (ref 135–145)
SODIUM: 139 mmol/L (ref 135–145)
SODIUM: 138 mmol/L (ref 135–145)
Sodium: 140 mmol/L (ref 135–145)
Sodium: 137 mmol/L (ref 135–145)
Sodium: 137 mmol/L (ref 135–145)
Sodium: 142 mmol/L (ref 135–145)
TCO2: 24 mmol/L (ref 0–100)
TCO2: 25 mmol/L (ref 0–100)
TCO2: 25 mmol/L (ref 0–100)
TCO2: 25 mmol/L (ref 0–100)
TCO2: 25 mmol/L (ref 0–100)
TCO2: 26 mmol/L (ref 0–100)
TCO2: 21 mmol/L (ref 0–100)

## 2014-06-22 LAB — POCT I-STAT 3, ART BLOOD GAS (G3+)
ACID-BASE DEFICIT: 3 mmol/L — AB (ref 0.0–2.0)
Acid-Base Excess: 1 mmol/L (ref 0.0–2.0)
Acid-base deficit: 5 mmol/L — ABNORMAL HIGH (ref 0.0–2.0)
Acid-base deficit: 3 mmol/L — ABNORMAL HIGH (ref 0.0–2.0)
BICARBONATE: 21.4 meq/L (ref 20.0–24.0)
Bicarbonate: 26 mEq/L — ABNORMAL HIGH (ref 20.0–24.0)
Bicarbonate: 24.9 mEq/L — ABNORMAL HIGH (ref 20.0–24.0)
Bicarbonate: 22.8 mEq/L (ref 20.0–24.0)
Bicarbonate: 22.1 mEq/L (ref 20.0–24.0)
O2 SAT: 100 %
O2 SAT: 99 %
O2 SAT: 97 %
O2 Saturation: 100 %
O2 Saturation: 99 %
PCO2 ART: 40.5 mmHg (ref 35.0–45.0)
PCO2 ART: 45.9 mmHg — AB (ref 35.0–45.0)
PCO2 ART: 40.8 mmHg (ref 35.0–45.0)
PH ART: 7.345 — AB (ref 7.350–7.450)
PO2 ART: 442 mmHg — AB (ref 80.0–100.0)
PO2 ART: 155 mmHg — AB (ref 80.0–100.0)
PO2 ART: 97 mmHg (ref 80.0–100.0)
Patient temperature: 37.1
Patient temperature: 37.7
TCO2: 27 mmol/L (ref 0–100)
TCO2: 26 mmol/L (ref 0–100)
TCO2: 24 mmol/L (ref 0–100)
TCO2: 23 mmol/L (ref 0–100)
TCO2: 23 mmol/L (ref 0–100)
pCO2 arterial: 38.4 mmHg (ref 35.0–45.0)
pCO2 arterial: 41.7 mmHg (ref 35.0–45.0)
pH, Arterial: 7.416 (ref 7.350–7.450)
pH, Arterial: 7.42 (ref 7.350–7.450)
pH, Arterial: 7.346 — ABNORMAL LOW (ref 7.350–7.450)
pH, Arterial: 7.282 — ABNORMAL LOW (ref 7.350–7.450)
pO2, Arterial: 356 mmHg — ABNORMAL HIGH (ref 80.0–100.0)
pO2, Arterial: 156 mmHg — ABNORMAL HIGH (ref 80.0–100.0)

## 2014-06-22 LAB — POCT I-STAT 4, (NA,K, GLUC, HGB,HCT)
Glucose, Bld: 171 mg/dL — ABNORMAL HIGH (ref 65–99)
HCT: 36 % — ABNORMAL LOW (ref 39.0–52.0)
Hemoglobin: 12.2 g/dL — ABNORMAL LOW (ref 13.0–17.0)
POTASSIUM: 3.8 mmol/L (ref 3.5–5.1)
Sodium: 140 mmol/L (ref 135–145)

## 2014-06-22 LAB — GLUCOSE, CAPILLARY
GLUCOSE-CAPILLARY: 88 mg/dL (ref 65–99)
Glucose-Capillary: 100 mg/dL — ABNORMAL HIGH (ref 65–99)
Glucose-Capillary: 105 mg/dL — ABNORMAL HIGH (ref 65–99)
Glucose-Capillary: 105 mg/dL — ABNORMAL HIGH (ref 65–99)
Glucose-Capillary: 118 mg/dL — ABNORMAL HIGH (ref 65–99)

## 2014-06-22 LAB — HEMOGLOBIN AND HEMATOCRIT, BLOOD
HEMATOCRIT: 28.9 % — AB (ref 39.0–52.0)
HEMOGLOBIN: 9.8 g/dL — AB (ref 13.0–17.0)

## 2014-06-22 LAB — CBC
HCT: 35.9 % — ABNORMAL LOW (ref 39.0–52.0)
HEMATOCRIT: 40.7 % (ref 39.0–52.0)
HEMATOCRIT: 34.8 % — AB (ref 39.0–52.0)
Hemoglobin: 13.5 g/dL (ref 13.0–17.0)
Hemoglobin: 12.2 g/dL — ABNORMAL LOW (ref 13.0–17.0)
Hemoglobin: 11.7 g/dL — ABNORMAL LOW (ref 13.0–17.0)
MCH: 30.2 pg (ref 26.0–34.0)
MCH: 30.3 pg (ref 26.0–34.0)
MCH: 30.1 pg (ref 26.0–34.0)
MCHC: 33.2 g/dL (ref 30.0–36.0)
MCHC: 34 g/dL (ref 30.0–36.0)
MCHC: 33.6 g/dL (ref 30.0–36.0)
MCV: 91.1 fL (ref 78.0–100.0)
MCV: 89.3 fL (ref 78.0–100.0)
MCV: 89.5 fL (ref 78.0–100.0)
PLATELETS: 199 10*3/uL (ref 150–400)
PLATELETS: 120 10*3/uL — AB (ref 150–400)
Platelets: 127 10*3/uL — ABNORMAL LOW (ref 150–400)
RBC: 4.47 MIL/uL (ref 4.22–5.81)
RBC: 4.02 MIL/uL — AB (ref 4.22–5.81)
RBC: 3.89 MIL/uL — AB (ref 4.22–5.81)
RDW: 13.2 % (ref 11.5–15.5)
RDW: 13.2 % (ref 11.5–15.5)
RDW: 13.3 % (ref 11.5–15.5)
WBC: 7.3 10*3/uL (ref 4.0–10.5)
WBC: 9.1 10*3/uL (ref 4.0–10.5)
WBC: 9.7 10*3/uL (ref 4.0–10.5)

## 2014-06-22 LAB — CREATININE, SERUM
Creatinine, Ser: 0.8 mg/dL (ref 0.61–1.24)
GFR calc Af Amer: >60 mL/min (ref >60)
GFR calc non Af Amer: >60 mL/min (ref >60)

## 2014-06-22 LAB — PLATELET COUNT: Platelets: 149 10*3/uL — ABNORMAL LOW (ref 150–400)

## 2014-06-22 LAB — APTT: aPTT: 30 seconds (ref 24–37)

## 2014-06-22 LAB — PROTIME-INR
INR: 1.35 (ref 0.00–1.49)
Prothrombin Time: 16.8 seconds — ABNORMAL HIGH (ref 11.6–15.2)

## 2014-06-22 LAB — HEPARIN LEVEL (UNFRACTIONATED): HEPARIN UNFRACTIONATED: 0.48 [IU]/mL (ref 0.30–0.70)

## 2014-06-22 LAB — MAGNESIUM: Magnesium: 2.9 mg/dL — ABNORMAL HIGH (ref 1.7–2.4)

## 2014-06-22 SURGERY — CORONARY ARTERY BYPASS GRAFTING (CABG)
Anesthesia: General | Site: Chest

## 2014-06-22 MED ORDER — POTASSIUM CHLORIDE 10 MEQ/50ML IV SOLN
10.0000 meq | INTRAVENOUS | Status: AC
  Administered 2014-06-22 (×3): 10 meq via INTRAVENOUS

## 2014-06-22 MED ORDER — HEPARIN SODIUM (PORCINE) 1000 UNIT/ML IJ SOLN
INTRAMUSCULAR | Status: DC | PRN
  Administered 2014-06-22: 2000 [IU] via INTRAVENOUS
  Administered 2014-06-22: 28000 [IU] via INTRAVENOUS

## 2014-06-22 MED ORDER — NITROGLYCERIN IN D5W 200-5 MCG/ML-% IV SOLN: 0.0000 ug/min | INTRAVENOUS | Status: DC

## 2014-06-22 MED ORDER — SODIUM CHLORIDE 0.9 % IJ SOLN
OROMUCOSAL | Status: DC | PRN
  Administered 2014-06-22 (×3): 4 mL via TOPICAL

## 2014-06-22 MED ORDER — DEXTROSE 5 % IV SOLN
1.5000 g | Freq: Two times a day (BID) | INTRAVENOUS | Status: AC
  Administered 2014-06-22 – 2014-06-24 (×4): 1.5 g via INTRAVENOUS
  Filled 2014-06-22 (×4): qty 1.5

## 2014-06-22 MED ORDER — INSULIN REGULAR HUMAN 100 UNIT/ML IJ SOLN
INTRAMUSCULAR | Status: DC
  Administered 2014-06-22: 2.2 [IU]/h via INTRAVENOUS
  Filled 2014-06-22 (×2): qty 2.5

## 2014-06-22 MED ORDER — SODIUM CHLORIDE 0.9 % IV SOLN
INTRAVENOUS | Status: DC | PRN
  Administered 2014-06-22: 13:00:00 via INTRAVENOUS

## 2014-06-22 MED ORDER — ROCURONIUM BROMIDE 50 MG/5ML IV SOLN
INTRAVENOUS | Status: AC
  Filled 2014-06-22: qty 2

## 2014-06-22 MED ORDER — SODIUM CHLORIDE 0.9 % IJ SOLN
3.0000 mL | Freq: Two times a day (BID) | INTRAMUSCULAR | Status: DC
  Administered 2014-06-24: 3 mL via INTRAVENOUS

## 2014-06-22 MED ORDER — BISACODYL 5 MG PO TBEC
10.0000 mg | DELAYED_RELEASE_TABLET | Freq: Every day | ORAL | Status: DC
  Administered 2014-06-23 – 2014-06-26 (×4): 10 mg via ORAL
  Filled 2014-06-22 (×4): qty 2

## 2014-06-22 MED ORDER — HEPARIN SODIUM (PORCINE) 1000 UNIT/ML IJ SOLN
INTRAMUSCULAR | Status: AC
  Filled 2014-06-22: qty 1

## 2014-06-22 MED ORDER — SUCCINYLCHOLINE CHLORIDE 20 MG/ML IJ SOLN
INTRAMUSCULAR | Status: AC
  Filled 2014-06-22: qty 1

## 2014-06-22 MED ORDER — CALCIUM CHLORIDE 10 % IV SOLN
INTRAVENOUS | Status: DC | PRN
  Administered 2014-06-22: 400 mg via INTRAVENOUS

## 2014-06-22 MED ORDER — TRAMADOL HCL 50 MG PO TABS: 50.0000 mg | ORAL_TABLET | ORAL | Status: DC | PRN

## 2014-06-22 MED ORDER — STERILE WATER FOR INJECTION IJ SOLN
INTRAMUSCULAR | Status: AC
  Filled 2014-06-22: qty 10

## 2014-06-22 MED ORDER — ACETAMINOPHEN 500 MG PO TABS
1000.0000 mg | ORAL_TABLET | Freq: Four times a day (QID) | ORAL | Status: DC
  Administered 2014-06-23 – 2014-06-27 (×16): 1000 mg via ORAL
  Filled 2014-06-22 (×23): qty 2

## 2014-06-22 MED ORDER — ASPIRIN 81 MG PO CHEW
324.0000 mg | CHEWABLE_TABLET | Freq: Every day | ORAL | Status: DC
  Filled 2014-06-22: qty 4

## 2014-06-22 MED ORDER — MAGNESIUM SULFATE 4 GM/100ML IV SOLN
4.0000 g | Freq: Once | INTRAVENOUS | Status: AC
  Administered 2014-06-22: 4 g via INTRAVENOUS
  Filled 2014-06-22: qty 100

## 2014-06-22 MED ORDER — ALBUMIN HUMAN 5 % IV SOLN
250.0000 mL | INTRAVENOUS | Status: AC | PRN
  Administered 2014-06-22 (×2): 250 mL via INTRAVENOUS
  Filled 2014-06-22 (×2): qty 250

## 2014-06-22 MED ORDER — METOPROLOL TARTRATE 1 MG/ML IV SOLN: 2.5000 mg | INTRAVENOUS | Status: DC | PRN

## 2014-06-22 MED ORDER — FENTANYL CITRATE (PF) 250 MCG/5ML IJ SOLN
INTRAMUSCULAR | Status: AC
  Filled 2014-06-22: qty 5

## 2014-06-22 MED ORDER — MORPHINE SULFATE 2 MG/ML IJ SOLN
1.0000 mg | INTRAMUSCULAR | Status: AC | PRN
  Administered 2014-06-22 (×2): 1 mg via INTRAVENOUS
  Filled 2014-06-22: qty 1

## 2014-06-22 MED ORDER — SODIUM CHLORIDE 0.45 % IV SOLN: INTRAVENOUS | Status: DC | PRN

## 2014-06-22 MED ORDER — FAMOTIDINE IN NACL 20-0.9 MG/50ML-% IV SOLN
20.0000 mg | Freq: Two times a day (BID) | INTRAVENOUS | Status: AC
  Administered 2014-06-22: 20 mg via INTRAVENOUS

## 2014-06-22 MED ORDER — FENTANYL CITRATE (PF) 100 MCG/2ML IJ SOLN
INTRAMUSCULAR | Status: DC | PRN
  Administered 2014-06-22: 100 ug via INTRAVENOUS
  Administered 2014-06-22 (×2): 150 ug via INTRAVENOUS
  Administered 2014-06-22 (×2): 100 ug via INTRAVENOUS
  Administered 2014-06-22: 50 ug via INTRAVENOUS
  Administered 2014-06-22: 150 ug via INTRAVENOUS
  Administered 2014-06-22: 100 ug via INTRAVENOUS
  Administered 2014-06-22: 250 ug via INTRAVENOUS
  Administered 2014-06-22: 150 ug via INTRAVENOUS
  Administered 2014-06-22: 100 ug via INTRAVENOUS
  Administered 2014-06-22: 500 ug via INTRAVENOUS
  Administered 2014-06-22: 100 ug via INTRAVENOUS

## 2014-06-22 MED ORDER — LACTATED RINGERS IV SOLN
INTRAVENOUS | Status: DC | PRN
  Administered 2014-06-22 (×6): via INTRAVENOUS

## 2014-06-22 MED ORDER — ACETAMINOPHEN 160 MG/5ML PO SOLN: 650.0000 mg | Freq: Once | ORAL | Status: AC

## 2014-06-22 MED ORDER — METOPROLOL TARTRATE 12.5 MG HALF TABLET
12.5000 mg | ORAL_TABLET | Freq: Two times a day (BID) | ORAL | Status: DC
  Filled 2014-06-22 (×5): qty 1

## 2014-06-22 MED ORDER — VANCOMYCIN HCL IN DEXTROSE 1-5 GM/200ML-% IV SOLN
1000.0000 mg | Freq: Once | INTRAVENOUS | Status: AC
  Administered 2014-06-22: 1000 mg via INTRAVENOUS
  Filled 2014-06-22: qty 200

## 2014-06-22 MED ORDER — ACETAMINOPHEN 650 MG RE SUPP
650.0000 mg | Freq: Once | RECTAL | Status: AC
  Administered 2014-06-22: 650 mg via RECTAL

## 2014-06-22 MED ORDER — MIDAZOLAM HCL 2 MG/2ML IJ SOLN
INTRAMUSCULAR | Status: AC
  Filled 2014-06-22: qty 2

## 2014-06-22 MED ORDER — PHENYLEPHRINE HCL 10 MG/ML IJ SOLN
0.0000 ug/min | INTRAMUSCULAR | Status: DC
  Filled 2014-06-22: qty 2

## 2014-06-22 MED ORDER — PROTAMINE SULFATE 10 MG/ML IV SOLN
INTRAVENOUS | Status: AC
  Filled 2014-06-22: qty 25

## 2014-06-22 MED ORDER — PROTAMINE SULFATE 10 MG/ML IV SOLN
INTRAVENOUS | Status: AC
  Filled 2014-06-22: qty 5

## 2014-06-22 MED ORDER — 0.9 % SODIUM CHLORIDE (POUR BTL) OPTIME
TOPICAL | Status: DC | PRN
  Administered 2014-06-22: 6000 mL

## 2014-06-22 MED ORDER — PANTOPRAZOLE SODIUM 40 MG PO TBEC
40.0000 mg | DELAYED_RELEASE_TABLET | Freq: Every day | ORAL | Status: DC
  Administered 2014-06-24 – 2014-06-27 (×4): 40 mg via ORAL
  Filled 2014-06-22 (×4): qty 1

## 2014-06-22 MED ORDER — ASPIRIN EC 325 MG PO TBEC
325.0000 mg | DELAYED_RELEASE_TABLET | Freq: Every day | ORAL | Status: DC
  Administered 2014-06-23 – 2014-06-26 (×4): 325 mg via ORAL
  Filled 2014-06-22 (×4): qty 1

## 2014-06-22 MED ORDER — DEXMEDETOMIDINE HCL IN NACL 200 MCG/50ML IV SOLN
0.0000 ug/kg/h | INTRAVENOUS | Status: DC
  Administered 2014-06-22: 0.3 ug/kg/h via INTRAVENOUS
  Filled 2014-06-22: qty 50

## 2014-06-22 MED ORDER — ACETAMINOPHEN 160 MG/5ML PO SOLN
1000.0000 mg | Freq: Four times a day (QID) | ORAL | Status: DC
  Filled 2014-06-22: qty 40

## 2014-06-22 MED ORDER — SODIUM CHLORIDE 0.9 % IJ SOLN
3.0000 mL | INTRAMUSCULAR | Status: DC | PRN
Start: 2014-06-23 — End: 2014-06-24

## 2014-06-22 MED ORDER — MIDAZOLAM HCL 5 MG/5ML IJ SOLN
INTRAMUSCULAR | Status: DC | PRN
  Administered 2014-06-22: 3 mg via INTRAVENOUS
  Administered 2014-06-22: 2 mg via INTRAVENOUS
  Administered 2014-06-22: 3 mg via INTRAVENOUS
  Administered 2014-06-22 (×2): 2 mg via INTRAVENOUS

## 2014-06-22 MED ORDER — LACTATED RINGERS IV SOLN: INTRAVENOUS | Status: DC

## 2014-06-22 MED ORDER — MIDAZOLAM HCL 2 MG/2ML IJ SOLN: 2.0000 mg | INTRAMUSCULAR | Status: DC | PRN

## 2014-06-22 MED ORDER — DOPAMINE-DEXTROSE 3.2-5 MG/ML-% IV SOLN: 0.0000 ug/kg/min | INTRAVENOUS | Status: DC

## 2014-06-22 MED ORDER — BISACODYL 10 MG RE SUPP
10.0000 mg | Freq: Every day | RECTAL | Status: DC
  Filled 2014-06-22: qty 1

## 2014-06-22 MED ORDER — ONDANSETRON HCL 4 MG/2ML IJ SOLN
4.0000 mg | Freq: Four times a day (QID) | INTRAMUSCULAR | Status: DC | PRN
  Administered 2014-06-22 – 2014-06-24 (×3): 4 mg via INTRAVENOUS
  Filled 2014-06-22 (×3): qty 2

## 2014-06-22 MED ORDER — LACTATED RINGERS IV SOLN: 500.0000 mL | Freq: Once | INTRAVENOUS | Status: AC | PRN

## 2014-06-22 MED ORDER — PROPOFOL 10 MG/ML IV BOLUS
INTRAVENOUS | Status: AC
  Filled 2014-06-22: qty 20

## 2014-06-22 MED ORDER — SODIUM BICARBONATE 8.4 % IV SOLN
50.0000 meq | Freq: Once | INTRAVENOUS | Status: AC
  Administered 2014-06-22: 50 meq via INTRAVENOUS

## 2014-06-22 MED ORDER — GLYCOPYRROLATE 0.2 MG/ML IJ SOLN
INTRAMUSCULAR | Status: DC | PRN
  Administered 2014-06-22 (×2): 0.1 mg via INTRAVENOUS

## 2014-06-22 MED ORDER — OXYCODONE HCL 5 MG PO TABS
5.0000 mg | ORAL_TABLET | ORAL | Status: DC | PRN
  Administered 2014-06-23: 10 mg via ORAL
  Administered 2014-06-23: 5 mg via ORAL
  Administered 2014-06-24 – 2014-06-26 (×9): 10 mg via ORAL
  Administered 2014-06-27: 5 mg via ORAL
  Filled 2014-06-22 (×2): qty 2
  Filled 2014-06-22: qty 1
  Filled 2014-06-22 (×2): qty 2
  Filled 2014-06-22 (×2): qty 1
  Filled 2014-06-22: qty 2
  Filled 2014-06-22: qty 1
  Filled 2014-06-22: qty 2
  Filled 2014-06-22 (×2): qty 1
  Filled 2014-06-22 (×2): qty 2

## 2014-06-22 MED ORDER — EPHEDRINE SULFATE 50 MG/ML IJ SOLN
INTRAMUSCULAR | Status: AC
  Filled 2014-06-22: qty 1

## 2014-06-22 MED ORDER — METOPROLOL TARTRATE 25 MG/10 ML ORAL SUSPENSION
12.5000 mg | Freq: Two times a day (BID) | ORAL | Status: DC
  Filled 2014-06-22 (×5): qty 5

## 2014-06-22 MED ORDER — MORPHINE SULFATE 2 MG/ML IJ SOLN
2.0000 mg | INTRAMUSCULAR | Status: DC | PRN
  Administered 2014-06-22 – 2014-06-23 (×6): 2 mg via INTRAVENOUS
  Filled 2014-06-22 (×6): qty 1

## 2014-06-22 MED ORDER — SODIUM CHLORIDE 0.9 % IV SOLN: 250.0000 mL | INTRAVENOUS | Status: DC

## 2014-06-22 MED ORDER — PROPOFOL 10 MG/ML IV BOLUS
INTRAVENOUS | Status: DC | PRN
  Administered 2014-06-22: 50 mg via INTRAVENOUS

## 2014-06-22 MED ORDER — PROTAMINE SULFATE 10 MG/ML IV SOLN
INTRAVENOUS | Status: DC | PRN
  Administered 2014-06-22: 30 mg via INTRAVENOUS
  Administered 2014-06-22 (×2): 50 mg via INTRAVENOUS
  Administered 2014-06-22: 20 mg via INTRAVENOUS
  Administered 2014-06-22 (×2): 50 mg via INTRAVENOUS

## 2014-06-22 MED ORDER — HEMOSTATIC AGENTS (NO CHARGE) OPTIME
TOPICAL | Status: DC | PRN
  Administered 2014-06-22: 1 via TOPICAL

## 2014-06-22 MED ORDER — ROCURONIUM BROMIDE 100 MG/10ML IV SOLN
INTRAVENOUS | Status: DC | PRN
  Administered 2014-06-22: 100 mg via INTRAVENOUS
  Administered 2014-06-22 (×3): 50 mg via INTRAVENOUS

## 2014-06-22 MED ORDER — LIDOCAINE HCL (CARDIAC) 20 MG/ML IV SOLN
INTRAVENOUS | Status: AC
  Filled 2014-06-22: qty 5

## 2014-06-22 MED ORDER — SODIUM CHLORIDE 0.9 % IV SOLN
5.0000 g/h | INTRAVENOUS | Status: DC
  Filled 2014-06-22: qty 20

## 2014-06-22 MED ORDER — ALBUMIN HUMAN 5 % IV SOLN
INTRAVENOUS | Status: DC | PRN
  Administered 2014-06-22: 13:00:00 via INTRAVENOUS

## 2014-06-22 MED ORDER — MIDAZOLAM HCL 10 MG/2ML IJ SOLN
INTRAMUSCULAR | Status: AC
  Filled 2014-06-22: qty 2

## 2014-06-22 MED ORDER — INSULIN REGULAR BOLUS VIA INFUSION
0.0000 [IU] | Freq: Three times a day (TID) | INTRAVENOUS | Status: DC
Start: 2014-06-22 — End: 2014-06-24
  Filled 2014-06-22: qty 10

## 2014-06-22 MED ORDER — SODIUM CHLORIDE 0.9 % IV SOLN
INTRAVENOUS | Status: DC
  Administered 2014-06-22: 14:00:00 via INTRAVENOUS

## 2014-06-22 MED ORDER — DOCUSATE SODIUM 100 MG PO CAPS
200.0000 mg | ORAL_CAPSULE | Freq: Every day | ORAL | Status: DC
  Administered 2014-06-23 – 2014-06-27 (×5): 200 mg via ORAL
  Filled 2014-06-22 (×8): qty 2

## 2014-06-22 MED FILL — Sodium Bicarbonate IV Soln 8.4%: INTRAVENOUS | Qty: 50 | Status: AC

## 2014-06-22 MED FILL — Heparin Sodium (Porcine) Inj 1000 Unit/ML: INTRAMUSCULAR | Qty: 30 | Status: AC

## 2014-06-22 MED FILL — Mannitol IV Soln 20%: INTRAVENOUS | Qty: 500 | Status: AC

## 2014-06-22 MED FILL — Lidocaine HCl IV Inj 20 MG/ML: INTRAVENOUS | Qty: 5 | Status: AC

## 2014-06-22 MED FILL — Sodium Chloride IV Soln 0.9%: INTRAVENOUS | Qty: 3000 | Status: AC

## 2014-06-22 MED FILL — Electrolyte-R (PH 7.4) Solution: INTRAVENOUS | Qty: 3000 | Status: AC

## 2014-06-22 SURGICAL SUPPLY — 106 items
ADAPTER CARDIO PERF ANTE/RETRO (ADAPTER) ×4 IMPLANT
BAG DECANTER FOR FLEXI CONT (MISCELLANEOUS) ×4 IMPLANT
BANDAGE ELASTIC 4 VELCRO ST LF (GAUZE/BANDAGES/DRESSINGS) ×4 IMPLANT
BANDAGE ELASTIC 6 VELCRO ST LF (GAUZE/BANDAGES/DRESSINGS) ×4 IMPLANT
BASKET HEART  (ORDER IN 25'S) (MISCELLANEOUS) ×1
BASKET HEART (ORDER IN 25'S) (MISCELLANEOUS) ×1
BASKET HEART (ORDER IN 25S) (MISCELLANEOUS) ×2 IMPLANT
BLADE STERNUM SYSTEM 6 (BLADE) ×4 IMPLANT
BNDG GAUZE ELAST 4 BULKY (GAUZE/BANDAGES/DRESSINGS) ×4 IMPLANT
CANISTER SUCTION 2500CC (MISCELLANEOUS) ×4 IMPLANT
CANNULA EZ GLIDE AORTIC 21FR (CANNULA) ×4 IMPLANT
CANNULA GUNDRY RCSP 15FR (MISCELLANEOUS) ×4 IMPLANT
CANNULA RT ANGLE VENOUS 32FR (CANNULA) ×2 IMPLANT
CANNULA SUMP PERICARDIAL (CANNULA) ×4 IMPLANT
CANNULA VRC MALB SNGL STG 36FR (MISCELLANEOUS) ×2 IMPLANT
CANNULAE RT ANGLE VENOUS 32FR (CANNULA) ×4
CARDIOBLATE CARDIAC ABLATION (MISCELLANEOUS) ×4
CATH CPB KIT HENDRICKSON (MISCELLANEOUS) ×4 IMPLANT
CATH HEART VENT LEFT (CATHETERS) ×2 IMPLANT
CATH ROBINSON RED A/P 18FR (CATHETERS) ×12 IMPLANT
CATH THORACIC 36FR (CATHETERS) ×4 IMPLANT
CATH THORACIC 36FR RT ANG (CATHETERS) ×4 IMPLANT
CLIP FOGARTY SPRING 6M (CLIP) ×4 IMPLANT
CLIP TI MEDIUM 24 (CLIP) IMPLANT
CLIP TI WIDE RED SMALL 24 (CLIP) ×8 IMPLANT
CONN 1/2X1/2X1/2  BEN (MISCELLANEOUS) ×2
CONN 1/2X1/2X1/2 BEN (MISCELLANEOUS) ×2 IMPLANT
CONN 3/8X1/2 ST GISH (MISCELLANEOUS) ×8 IMPLANT
CONN ST 1/2X1/2  BEN (MISCELLANEOUS) ×2
CONN ST 1/2X1/2 BEN (MISCELLANEOUS) ×2 IMPLANT
CRADLE DONUT ADULT HEAD (MISCELLANEOUS) ×4 IMPLANT
DEVICE CARDIOBLATE CARDIAC ABL (MISCELLANEOUS) ×2 IMPLANT
DRAPE CARDIOVASCULAR INCISE (DRAPES) ×2
DRAPE SLUSH/WARMER DISC (DRAPES) ×4 IMPLANT
DRAPE SRG 135X102X78XABS (DRAPES) ×2 IMPLANT
DRSG COVADERM 4X14 (GAUZE/BANDAGES/DRESSINGS) ×4 IMPLANT
ELECT REM PT RETURN 9FT ADLT (ELECTROSURGICAL) ×8
ELECTRODE REM PT RTRN 9FT ADLT (ELECTROSURGICAL) ×4 IMPLANT
GAUZE SPONGE 4X4 12PLY STRL (GAUZE/BANDAGES/DRESSINGS) ×8 IMPLANT
GLOVE BIO SURGEON STRL SZ7 (GLOVE) ×12 IMPLANT
GLOVE BIO SURGEON STRL SZ7.5 (GLOVE) ×8 IMPLANT
GLOVE BIOGEL PI IND STRL 6 (GLOVE) ×6 IMPLANT
GLOVE BIOGEL PI IND STRL 7.0 (GLOVE) ×6 IMPLANT
GLOVE BIOGEL PI INDICATOR 6 (GLOVE) ×6
GLOVE BIOGEL PI INDICATOR 7.0 (GLOVE) ×6
GLOVE SURG SIGNA 7.5 PF LTX (GLOVE) ×20 IMPLANT
GOWN STRL REUS W/ TWL LRG LVL3 (GOWN DISPOSABLE) ×8 IMPLANT
GOWN STRL REUS W/ TWL XL LVL3 (GOWN DISPOSABLE) ×4 IMPLANT
GOWN STRL REUS W/TWL LRG LVL3 (GOWN DISPOSABLE) ×8
GOWN STRL REUS W/TWL XL LVL3 (GOWN DISPOSABLE) ×4
HEMOSTAT POWDER SURGIFOAM 1G (HEMOSTASIS) ×12 IMPLANT
HEMOSTAT SURGICEL 2X14 (HEMOSTASIS) ×4 IMPLANT
INSERT FOGARTY XLG (MISCELLANEOUS) IMPLANT
KIT BASIN OR (CUSTOM PROCEDURE TRAY) ×4 IMPLANT
KIT ROOM TURNOVER OR (KITS) ×4 IMPLANT
KIT SUCTION CATH 14FR (SUCTIONS) ×8 IMPLANT
KIT VASOVIEW W/TROCAR VH 2000 (KITS) ×4 IMPLANT
LOOP VESSEL SUPERMAXI WHITE (MISCELLANEOUS) ×4 IMPLANT
MARKER GRAFT CORONARY BYPASS (MISCELLANEOUS) ×12 IMPLANT
NS IRRIG 1000ML POUR BTL (IV SOLUTION) ×20 IMPLANT
PACK OPEN HEART (CUSTOM PROCEDURE TRAY) ×4 IMPLANT
PAD ARMBOARD 7.5X6 YLW CONV (MISCELLANEOUS) ×8 IMPLANT
PAD ELECT DEFIB RADIOL ZOLL (MISCELLANEOUS) ×4 IMPLANT
PENCIL BUTTON HOLSTER BLD 10FT (ELECTRODE) ×4 IMPLANT
PUNCH AORTIC ROT 4.0MM RCL 40 (MISCELLANEOUS) ×4 IMPLANT
PUNCH AORTIC ROTATE 4.0MM (MISCELLANEOUS) IMPLANT
PUNCH AORTIC ROTATE 4.5MM 8IN (MISCELLANEOUS) IMPLANT
PUNCH AORTIC ROTATE 5MM 8IN (MISCELLANEOUS) IMPLANT
SPONGE GAUZE 4X4 12PLY STER LF (GAUZE/BANDAGES/DRESSINGS) ×8 IMPLANT
SUT BONE WAX W31G (SUTURE) ×4 IMPLANT
SUT ETHIBOND 2 0 SH (SUTURE) ×6
SUT ETHIBOND 2 0 SH 36X2 (SUTURE) ×6 IMPLANT
SUT MNCRL AB 4-0 PS2 18 (SUTURE) IMPLANT
SUT PROLENE 3 0 SH DA (SUTURE) ×4 IMPLANT
SUT PROLENE 4 0 RB 1 (SUTURE) ×8
SUT PROLENE 4 0 SH DA (SUTURE) ×16 IMPLANT
SUT PROLENE 4-0 RB1 .5 CRCL 36 (SUTURE) ×8 IMPLANT
SUT PROLENE 6 0 C 1 30 (SUTURE) ×8 IMPLANT
SUT PROLENE 7 0 BV1 MDA (SUTURE) ×12 IMPLANT
SUT PROLENE 8 0 BV175 6 (SUTURE) IMPLANT
SUT STEEL 6MS V (SUTURE) ×4 IMPLANT
SUT STEEL STERNAL CCS#1 18IN (SUTURE) IMPLANT
SUT STEEL SZ 6 DBL 3X14 BALL (SUTURE) ×4 IMPLANT
SUT VIC AB 1 CTX 36 (SUTURE) ×4
SUT VIC AB 1 CTX36XBRD ANBCTR (SUTURE) ×4 IMPLANT
SUT VIC AB 2-0 CT1 27 (SUTURE) ×2
SUT VIC AB 2-0 CT1 TAPERPNT 27 (SUTURE) ×2 IMPLANT
SUT VIC AB 2-0 CTX 27 (SUTURE) IMPLANT
SUT VIC AB 3-0 SH 27 (SUTURE)
SUT VIC AB 3-0 SH 27X BRD (SUTURE) IMPLANT
SUT VIC AB 3-0 X1 27 (SUTURE) ×4 IMPLANT
SUT VICRYL 4-0 PS2 18IN ABS (SUTURE) IMPLANT
SUTURE E-PAK OPEN HEART (SUTURE) ×4 IMPLANT
SYS ARTICLIP LAA EXCLUSION 140 (Clip) ×4 IMPLANT
SYSTEM SAHARA CHEST DRAIN ATS (WOUND CARE) ×4 IMPLANT
TAPE CLOTH SURG 4X10 WHT LF (GAUZE/BANDAGES/DRESSINGS) ×4 IMPLANT
TAPE PAPER 2X10 WHT MICROPORE (GAUZE/BANDAGES/DRESSINGS) ×4 IMPLANT
TOWEL OR 17X24 6PK STRL BLUE (TOWEL DISPOSABLE) ×8 IMPLANT
TOWEL OR 17X26 10 PK STRL BLUE (TOWEL DISPOSABLE) ×8 IMPLANT
TRAY FOLEY IC TEMP SENS 16FR (CATHETERS) ×4 IMPLANT
TUBE FEEDING 8FR 16IN STR KANG (MISCELLANEOUS) ×4 IMPLANT
TUBING INSUFFLATION (TUBING) ×4 IMPLANT
UNDERPAD 30X30 INCONTINENT (UNDERPADS AND DIAPERS) ×4 IMPLANT
VENT LEFT HEART 12002 (CATHETERS) ×4
VRC MALLEABLE SINGLE STG 36FR (MISCELLANEOUS) ×4
WATER STERILE IRR 1000ML POUR (IV SOLUTION) ×8 IMPLANT

## 2014-06-22 NOTE — Anesthesia Postprocedure Evaluation (Signed)
  Anesthesia Post-op Note  Patient: [name]  Procedure(s) Performed: Procedure(s): CORONARY ARTERY BYPASS GRAFTING (CABG) x five, using left internal mammary artery, and right leg greater saphenous vein harvested endoscopically (N/A) TRANSESOPHAGEAL ECHOCARDIOGRAM (TEE) (N/A) MAZE (N/A)  Patient Location: ICU  Anesthesia Type:General  Level of Consciousness: sedated and unresponsive  Airway and Oxygen Therapy: Patient remains intubated and on ventilator  Post-op Pain: none  Post-op Assessment: Post-op Vital signs reviewed, Patient's Cardiovascular Status Stable and Respiratory Function Stable  Post-op Vital Signs: Reviewed  Filed Vitals:   06/22/14 1345  BP: 108/56  Pulse: 91  Temp:   Resp: 12    Complications: No apparent anesthesia complications

## 2014-06-22 NOTE — Interval H&P Note (Signed)
History and Physical Interval Note: [name] wishes to have Maze procedure ("the operation for a fib"). I informed him of the additional risks associated with the maze. He accepts those and agrees to proceed. Will plan left sided Maze in addition to CABG. 06/22/2014 7:20 AM  [name]  has presented today for surgery, with the diagnosis of CAD  The various methods of treatment have been discussed with the patient and family. After consideration of risks, benefits and other options for treatment, the patient has consented to  Procedure(s): CORONARY ARTERY BYPASS GRAFTING (CABG) (N/A) TRANSESOPHAGEAL ECHOCARDIOGRAM (TEE) (N/A) as a surgical intervention .  The patient's history has been reviewed, patient examined, no change in status, stable for surgery.  I have reviewed the patient's chart and labs.  Questions were answered to the patient's satisfaction.     Melrose Nakayama

## 2014-06-22 NOTE — H&P (View-Only) (Signed)
Reason for Consult:3 vessel CAD s/p MI Referring Physician: Dr.Jordan  [name] is an 69 y.o. male.  HPI: 61 man presents with a cc/o chest pain.  [name] is a 69 yo man with a history of type II diabetes, hypertension, hyperlipidemia, atrial fibrillation and CAD. He had PCI with stenting of his RCA a month ago. He did well until last night when he developed severe dull right sided CP. He came to ED and was admitted. He ruled in for MI.  Today he had cardiac catheterization where he was found to have 3 vessel CAD with an EF of 35%.  He is currently pain free.  He was taking Brillinta prior to admission.  Past Medical History  Diagnosis Date  . Hypertension   . Elevated CPK     CPK elevated with normal MB and normal troponin the past  . Chest pain     Nuclear, 2006, no ischemia  . Atrial fibrillation     Paroxysmal, rare episodes, sinus rhythm on flecainide, patient prefers not to take Coumadin  . Arthritis     Right shoulder  . Ejection fraction     EF 55-60%,  echo, January, 2011  . Bradycardia     April, 2013  . Coronary artery disease     s/p staged cath 05/12/2014 and 5/11, DES x 2 to heavily calcified RCA, residual with 60% prox LAD, 70% D1  . Sinus drainage   . Diabetes mellitus without complication     type 2  . IBS (irritable bowel syndrome)     Past Surgical History  Procedure Laterality Date  . Cardiac catheterization N/A 05/12/2014    Procedure: Left Heart Cath and Coronary Angiography;  Surgeon: Troy Sine, MD;  Location: [place] [facility];  Service: Cardiovascular;  Laterality: N/A;  . Cardiovascular stress test  05/06/2014  . Appendectomy    . Cholecystectomy    . Cardiac catheterization N/A 05/13/2014    Procedure: Coronary/Graft Atherectomy;  Surgeon: Troy Sine, MD;  Location: [place] [facility];  Service: Cardiovascular;  Laterality: N/A;  . Cardiac catheterization Right 05/13/2014    Procedure: Temporary Pacemaker;  Surgeon: Troy Sine, MD;  Location: [place] [facility];  Service: Cardiovascular;  Laterality: Right;  . Cardiac catheterization N/A 05/13/2014    Procedure: Coronary Stent Intervention;  Surgeon: Troy Sine, MD;  Location: [place] [facility];  Service: Cardiovascular;  Laterality: N/A;  . Coronary stent placement    . Cataract extraction    . Cardiac catheterization N/A 06/18/2014    Procedure: Left Heart Cath and Coronary Angiography;  Surgeon: Peter M Martinique, MD;  Location: [place] [facility];  Service: Cardiovascular;  Laterality: N/A;    Family History  Problem Relation Age of Onset  . Alzheimer's disease Mother   . Emphysema Father   . Cancer Brother   . Arrhythmia Sister   . Heart attack Sister   . Heart disease Sister   . Hyperlipidemia Sister   . Hypertension Sister     Social History:  reports that he has never smoked. He has never used smokeless tobacco. He reports that he does not drink alcohol or use illicit drugs.  Allergies:  Allergies  Allergen Reactions  . Lipitor [Atorvastatin] Other (See Comments)    Myopathy Spring 2006  . Pravastatin Other (See Comments)    LEG CRAMPS  . Simvastatin Other (See Comments)    LEG CRAMPS    Medications:  Prior  to Admission:  Prescriptions prior to admission  Medication Sig Dispense Refill Last Dose  . aspirin 81 MG tablet Take 81 mg by mouth daily.   06/17/2014 at Unknown time  . Besifloxacin HCl (BESIVANCE OP) Place 1 drop into the left eye 3 (three) times daily. Post cataract surgery   06/17/2014 at Unknown time  . clidinium-chlordiazePOXIDE (LIBRAX) 5-2.5 MG per capsule take 1 capsule by mouth three times a day before meals 270 capsule 0 06/17/2014 at Unknown time  . Difluprednate (DUREZOL OP) Place 1 drop into the left eye 4 (four) times daily as needed (post cataract surgery).    06/17/2014 at Unknown time  . diphenoxylate-atropine (LOMOTIL) 2.5-0.025 MG per tablet take 1 tablet by mouth four times a day if needed (Patient taking  differently: Take 1 tablet by mouth 4 (four) times daily. To prevent diarrhea) 180 tablet 0 06/17/2014 at Unknown time  . fluticasone (FLONASE) 50 MCG/ACT nasal spray Place 1 spray into both nostrils as needed. (Patient taking differently: Place 1 spray into both nostrils as needed for allergies or rhinitis. ) 16 g 1 Past Month at Unknown time  . glyBURIDE-metformin (GLUCOVANCE) 5-500 MG per tablet Take 1 tablet by mouth daily with breakfast. (Patient taking differently: Take 1 tablet by mouth 3 (three) times daily. ) 180 tablet 0 06/17/2014 at Unknown time  . loteprednol (LOTEMAX) 0.5 % ophthalmic suspension Place 1 drop into both eyes daily as needed (dry eyes).   Past Month at Unknown time  . metoprolol succinate (TOPROL-XL) 50 MG 24 hr tablet Take 1 tablet (50 mg total) by mouth daily. 30 tablet 11 06/17/2014 at 0800  . Nepafenac (ILEVRO OP) Place 1 drop into the left eye daily. POST CATARACT LENSE SURGERY   06/17/2014 at Unknown time  . nitroGLYCERIN (NITROSTAT) 0.4 MG SL tablet Place 1 tablet (0.4 mg total) under the tongue every 5 (five) minutes as needed for chest pain. 25 tablet 3 06/17/2014 at Unknown time  . ramipril (ALTACE) 5 MG capsule TAKE 1 CAPSULE DAILY. (Patient taking differently: Take 5 mg by mouth daily. ) 90 capsule 3 06/17/2014 at Unknown time  . rosuvastatin (CRESTOR) 10 MG tablet Take 1 tablet (10 mg total) by mouth every Monday, Wednesday, and Friday. 15 tablet 11 06/17/2014 at Unknown time  . Soft Lens Products (AQUIFY MULTI-PURPOSE) SOLN Place 1 drop into both eyes daily as needed (dry eyes).   Past Month at Unknown time  . ticagrelor (BRILINTA) 90 MG TABS tablet Take 1 tablet (90 mg total) by mouth 2 (two) times daily. 180 tablet 3 06/17/2014 at Unknown time  . tamsulosin (FLOMAX) 0.4 MG CAPS capsule Take 1 capsule (0.4 mg total) by mouth daily. (Patient taking differently: Take 0.4 mg by mouth daily as needed (for problem urinations). ) 90 capsule 0 06/15/2014    Results for orders  placed or performed during the hospital encounter of 06/17/14 (from the past 48 hour(s))  CBC     Status: None   Collection Time: 06/17/14  7:52 PM  Result Value Ref Range   WBC 5.2 4.0 - 10.5 K/uL   RBC 4.61 4.22 - 5.81 MIL/uL   Hemoglobin 13.9 13.0 - 17.0 g/dL   HCT 41.4 39.0 - 52.0 %   MCV 89.8 78.0 - 100.0 fL   MCH 30.2 26.0 - 34.0 pg   MCHC 33.6 30.0 - 36.0 g/dL   RDW 13.0 11.5 - 15.5 %   Platelets 204 150 - 400 K/uL  Basic metabolic panel  Status: Abnormal   Collection Time: 06/17/14  7:52 PM  Result Value Ref Range   Sodium 139 135 - 145 mmol/L   Potassium 4.6 3.5 - 5.1 mmol/L   Chloride 106 101 - 111 mmol/L   CO2 25 22 - 32 mmol/L   Glucose, Bld 141 (H) 65 - 99 mg/dL   BUN 14 6 - 20 mg/dL   Creatinine, Ser 0.85 0.61 - 1.24 mg/dL   Calcium 9.7 8.9 - 10.3 mg/dL   GFR calc non Af Amer >60 >60 mL/min   GFR calc Af Amer >60 >60 mL/min    Comment: (NOTE) The eGFR has been calculated using the CKD EPI equation. This calculation has not been validated in all clinical situations. eGFR's persistently <60 mL/min signify possible Chronic Kidney Disease.    Anion gap 8 5 - 15  BNP (order ONLY if patient complains of dyspnea/SOB AND you have documented it for THIS visit)     Status: Abnormal   Collection Time: 06/17/14  7:52 PM  Result Value Ref Range   B Natriuretic Peptide 152.4 (H) 0.0 - 100.0 pg/mL  I-stat troponin, ED  (not at [facility], [facility])     Status: None   Collection Time: 06/17/14  8:03 PM  Result Value Ref Range   Troponin i, poc 0.00 0.00 - 0.08 ng/mL   Comment 3            Comment: Due to the release kinetics of cTnI, a negative result within the first hours of the onset of symptoms does not rule out myocardial infarction with certainty. If myocardial infarction is still suspected, repeat the test at appropriate intervals.   Troponin I     Status: Abnormal   Collection Time: 06/18/14 12:37 AM  Result Value Ref Range   Troponin I 5.16 (HH) <0.031 ng/mL     Comment:        POSSIBLE MYOCARDIAL ISCHEMIA. SERIAL TESTING RECOMMENDED. CRITICAL RESULT CALLED TO, READ BACK BY AND VERIFIED WITH: HUGHES,C RN 06/18/2014 0154 JORDANS REPEATED TO VERIFY   TSH     Status: None   Collection Time: 06/18/14  4:57 AM  Result Value Ref Range   TSH 0.748 0.350 - 4.500 uIU/mL  Magnesium     Status: None   Collection Time: 06/18/14  4:57 AM  Result Value Ref Range   Magnesium 2.0 1.7 - 2.4 mg/dL  Troponin I     Status: Abnormal   Collection Time: 06/18/14  4:57 AM  Result Value Ref Range   Troponin I 10.17 (HH) <0.031 ng/mL    Comment:        POSSIBLE MYOCARDIAL ISCHEMIA. SERIAL TESTING RECOMMENDED. CRITICAL VALUE NOTED.  VALUE IS CONSISTENT WITH PREVIOUSLY REPORTED AND CALLED VALUE. REPEATED TO VERIFY   Brain natriuretic peptide     Status: Abnormal   Collection Time: 06/18/14  4:57 AM  Result Value Ref Range   B Natriuretic Peptide 246.3 (H) 0.0 - 100.0 pg/mL  Basic metabolic panel     Status: Abnormal   Collection Time: 06/18/14  4:57 AM  Result Value Ref Range   Sodium 140 135 - 145 mmol/L   Potassium 3.7 3.5 - 5.1 mmol/L    Comment: DELTA CHECK NOTED   Chloride 109 101 - 111 mmol/L   CO2 23 22 - 32 mmol/L   Glucose, Bld 152 (H) 65 - 99 mg/dL   BUN 11 6 - 20 mg/dL   Creatinine, Ser 0.77 0.61 - 1.24 mg/dL   Calcium 9.2  8.9 - 10.3 mg/dL   GFR calc non Af Amer >60 >60 mL/min   GFR calc Af Amer >60 >60 mL/min    Comment: (NOTE) The eGFR has been calculated using the CKD EPI equation. This calculation has not been validated in all clinical situations. eGFR's persistently <60 mL/min signify possible Chronic Kidney Disease.    Anion gap 8 5 - 15  Lipid panel     Status: Abnormal   Collection Time: 06/18/14  4:57 AM  Result Value Ref Range   Cholesterol 160 0 - 200 mg/dL   Triglycerides 121 <150 mg/dL   HDL 39 (L) >40 mg/dL   Total CHOL/HDL Ratio 4.1 RATIO   VLDL 24 0 - 40 mg/dL   LDL Cholesterol 97 0 - 99 mg/dL    Comment:         Total Cholesterol/HDL:CHD Risk Coronary Heart Disease Risk Table                     Men   Women  1/2 Average Risk   3.4   3.3  Average Risk       5.0   4.4  2 X Average Risk   9.6   7.1  3 X Average Risk  23.4   11.0        Use the calculated Patient Ratio above and the CHD Risk Table to determine the patient's CHD Risk.        ATP III CLASSIFICATION (LDL):  <100     mg/dL   Optimal  100-129  mg/dL   Near or Above                    Optimal  130-159  mg/dL   Borderline  160-189  mg/dL   High  >190     mg/dL   Very High   CBC     Status: None   Collection Time: 06/18/14  4:57 AM  Result Value Ref Range   WBC 6.7 4.0 - 10.5 K/uL   RBC 4.59 4.22 - 5.81 MIL/uL   Hemoglobin 13.9 13.0 - 17.0 g/dL   HCT 40.5 39.0 - 52.0 %   MCV 88.2 78.0 - 100.0 fL   MCH 30.3 26.0 - 34.0 pg   MCHC 34.3 30.0 - 36.0 g/dL   RDW 13.1 11.5 - 15.5 %   Platelets 201 150 - 400 K/uL  Protime-INR     Status: None   Collection Time: 06/18/14  4:57 AM  Result Value Ref Range   Prothrombin Time 15.1 11.6 - 15.2 seconds   INR 1.18 0.00 - 1.49  Glucose, capillary     Status: Abnormal   Collection Time: 06/18/14  7:46 AM  Result Value Ref Range   Glucose-Capillary 171 (H) 65 - 99 mg/dL  Heparin level (unfractionated)     Status: None   Collection Time: 06/18/14  9:01 AM  Result Value Ref Range   Heparin Unfractionated 0.33 0.30 - 0.70 IU/mL    Comment:        IF HEPARIN RESULTS ARE BELOW EXPECTED VALUES, AND PATIENT DOSAGE HAS BEEN CONFIRMED, SUGGEST FOLLOW UP TESTING OF ANTITHROMBIN III LEVELS.   Troponin I     Status: Abnormal   Collection Time: 06/18/14  9:35 AM  Result Value Ref Range   Troponin I 11.28 (HH) <0.031 ng/mL    Comment:        POSSIBLE MYOCARDIAL ISCHEMIA. SERIAL TESTING RECOMMENDED. REPEATED TO VERIFY CRITICAL VALUE NOTED.  VALUE IS CONSISTENT WITH PREVIOUSLY REPORTED AND CALLED VALUE.   POCT Activated clotting time     Status: None   Collection Time: 06/18/14 11:24 AM   Result Value Ref Range   Activated Clotting Time 257 seconds  Glucose, capillary     Status: Abnormal   Collection Time: 06/18/14 12:32 PM  Result Value Ref Range   Glucose-Capillary 153 (H) 65 - 99 mg/dL  Troponin I     Status: Abnormal   Collection Time: 06/18/14  4:22 PM  Result Value Ref Range   Troponin I 8.89 (HH) <0.031 ng/mL    Comment:        POSSIBLE MYOCARDIAL ISCHEMIA. SERIAL TESTING RECOMMENDED. REPEATED TO VERIFY CRITICAL RESULT CALLED TO, READ BACK BY AND VERIFIED WITH: T.AYERS,RN 06/18/14 _0  BY V.WILKINS   Glucose, capillary     Status: Abnormal   Collection Time: 06/18/14  4:22 PM  Result Value Ref Range   Glucose-Capillary 249 (H) 65 - 99 mg/dL    Dg Chest 2 View  06/17/2014   CLINICAL DATA:  Acute onset of central chest pain. Initial encounter.  EXAM: CHEST  2 VIEW  COMPARISON:  Chest radiograph performed 11/22/2007  FINDINGS: The lungs are well-aerated and clear. There is no evidence of focal opacification, pleural effusion or pneumothorax.  The heart is borderline normal in size. No acute osseous abnormalities are seen.  IMPRESSION: No acute cardiopulmonary process seen.   Electronically Signed   By: Garald Balding M.D.   On: 06/17/2014 20:26    Review of Systems  Constitutional: Negative for malaise/fatigue.  HENT: Negative for nosebleeds.   Eyes:       S/p cataract extraction 06/14/14. L eye itches  Respiratory: Positive for shortness of breath. Negative for cough.   Cardiovascular: Positive for chest pain. Negative for orthopnea and claudication.  Gastrointestinal: Positive for heartburn. Negative for diarrhea, constipation and blood in stool.  Genitourinary: Negative for urgency and frequency.  Musculoskeletal: Positive for joint pain (right shoulder).  Neurological: Negative for dizziness, sensory change, speech change, focal weakness, loss of consciousness and headaches.  Endo/Heme/Allergies: Does not bruise/bleed easily.  Psychiatric/Behavioral:  Negative.   All other systems reviewed and are negative.  Blood pressure 129/72, pulse 67, temperature 97.6 F (36.4 C), temperature source Oral, resp. rate 18, height 5' 8" (1.727 m), weight 192 lb (87.091 kg), SpO2 98 %. Physical Exam  Vitals reviewed. Constitutional: He is oriented to person, place, and time. He appears well-developed and well-nourished. No distress.  HENT:  Head: Normocephalic and atraumatic.  Shield over left eye  Eyes: EOM are normal. No scleral icterus.  Neck: Neck supple. No thyromegaly present.  Cardiovascular: Normal rate, regular rhythm and intact distal pulses.  Exam reveals no gallop and no friction rub.   Murmur (faint systolic murmur) heard. Respiratory: Effort normal and breath sounds normal. He has no wheezes. He has no rales.  GI: Soft. Bowel sounds are normal. He exhibits no distension. There is no tenderness.  Musculoskeletal: He exhibits no edema.  Lymphadenopathy:    He has no cervical adenopathy.  Neurological: He is alert and oriented to person, place, and time. No cranial nerve deficit.  Skin: Skin is warm and dry.  Psychiatric: He has a normal mood and affect.    Assessment/Plan: 69 yo man with known CAD s/p recent PTCA/stent presents with unstable recurrent angina and ruled in for non STEMI. Cath revealed early restenosis of a stent in the RCA and significant disease in the LAD and circumflex.  He has severe LV dysfunction with an EF of 35%. CABG is indicated for survival benefit and relief of symptoms.  I have discussed the general nature of the procedure, the need for general anesthesia, the use of cardiopulmonary bypass, and the incisions to be used with Mr and Mrs Haffey. I discussed the expected hospital stay, overall recovery and short and long term outcomes. They understand the risks include, but are not limited to death, stroke, MI, DVT/PE, bleeding, possible need for transfusion, infections, cardiac arrhythmias, and other organ system  dysfunction including respiratory, renal, or GI complications.   He accepts the risks and agrees to proceed.  Plan CABG Monday 06/22/14 to allow for Brillinta wash out.  Melrose Nakayama 06/18/2014, 9:15 PM

## 2014-06-22 NOTE — Transfer of Care (Signed)
Immediate Anesthesia Transfer of Care Note  Patient: [name]  Procedure(s) Performed: Procedure(s): CORONARY ARTERY BYPASS GRAFTING (CABG) x five, using left internal mammary artery, and right leg greater saphenous vein harvested endoscopically (N/A) TRANSESOPHAGEAL ECHOCARDIOGRAM (TEE) (N/A) MAZE (N/A)  Patient Location: PACU  Anesthesia Type:General  Level of Consciousness: sedated  Airway & Oxygen Therapy: Patient remains intubated per anesthesia plan and Patient placed on Ventilator (see vital sign flow sheet for setting)  Post-op Assessment: Report given to RN and Post -op Vital signs reviewed and stable  Post vital signs: Reviewed and stable  Last Vitals:  Filed Vitals:   06/22/14 1345  BP: 108/56  Pulse: 91  Temp:   Resp: 12    Complications: No apparent anesthesia complications

## 2014-06-22 NOTE — Anesthesia Preprocedure Evaluation (Addendum)
Anesthesia Evaluation  Patient identified by MRN, date of birth, ID band Patient awake    Reviewed: Allergy & Precautions, H&P , NPO status , Patient's Chart, lab work & pertinent test results  Airway Mallampati: II  TM Distance: >3 FB Neck ROM: Full    Dental no notable dental hx. (+) Edentulous Upper, Dental Advisory Given   Pulmonary neg pulmonary ROS,  breath sounds clear to auscultation  Pulmonary exam normal       Cardiovascular hypertension, Pt. on medications and Pt. on home beta blockers + angina + CAD and + Cardiac Stents + dysrhythmias Atrial Fibrillation Rhythm:Regular Rate:Normal     Neuro/Psych negative neurological ROS  negative psych ROS   GI/Hepatic negative GI ROS,   Endo/Other  diabetes, Type 2, Oral Hypoglycemic Agents  Renal/GU negative Renal ROS  negative genitourinary   Musculoskeletal  (+) Arthritis -, Osteoarthritis,    Abdominal   Peds  Hematology negative hematology ROS (+)   Anesthesia Other Findings   Reproductive/Obstetrics negative OB ROS                           Anesthesia Physical Anesthesia Plan  ASA: IV  Anesthesia Plan: General   Post-op Pain Management:    Induction: Intravenous  Airway Management Planned: Oral ETT  Additional Equipment: Arterial line, CVP, PA Cath, TEE and Ultrasound Guidance Line Placement  Intra-op Plan:   Post-operative Plan: Post-operative intubation/ventilation  Informed Consent: I have reviewed the patients History and Physical, chart, labs and discussed the procedure including the risks, benefits and alternatives for the proposed anesthesia with the patient or authorized representative who has indicated his/her understanding and acceptance.   Dental advisory given  Plan Discussed with: CRNA  Anesthesia Plan Comments:         Anesthesia Quick Evaluation

## 2014-06-22 NOTE — Anesthesia Procedure Notes (Addendum)
Procedure Name: Intubation Date/Time: 06/22/2014 7:40 AM Performed by: [place] Pink Pre-anesthesia Checklist: Patient identified, Emergency Drugs available, Suction available, Timeout performed and Patient being monitored Patient Re-evaluated:Patient Re-evaluated prior to inductionOxygen Delivery Method: Circle system utilized Preoxygenation: Pre-oxygenation with 100% oxygen Intubation Type: IV induction Ventilation: Mask ventilation without difficulty and Oral airway inserted - appropriate to patient size Laryngoscope Size: Mac and 4 Grade View: Grade I Tube type: Oral Tube size: 8.0 mm Number of attempts: 1 Airway Equipment and Method: Stylet Placement Confirmation: ETT inserted through vocal cords under direct vision,  positive ETCO2 and breath sounds checked- equal and bilateral Secured at: 23 cm Tube secured with: Tape Dental Injury: Teeth and Oropharynx as per pre-operative assessment     Procedures: Right IJ Gordy Councilman Catheter Insertion: [id number]: The patient was identified and consent obtained.  TO was performed, and full barrier precautions were used.  The skin was anesthetized with lidocaine-4cc plain with 25g needle.  Once the vein was located with the 22 ga. needle using ultrasound guidance , the wire was inserted into the vein.  The wire location was confirmed with ultrasound.  The tissue was dilated and the 8.5 [place] cordis catheter was carefully inserted. Afterwards Gordy Councilman catheter was inserted. PA catheter at 48cm.  The patient tolerated the procedure well. CE

## 2014-06-22 NOTE — Procedures (Signed)
Extubation Procedure Note  Patient Details:   Name: [name] DOB: 05-13-1945 MRN: [id number]   Airway Documentation:     Evaluation  O2 sats: stable throughout Complications: No apparent complications Patient did tolerate procedure well. Bilateral Breath Sounds: Clear, Diminished Suctioning: Oral, Airway Yes   Pt extubated to 3L [place] per Heart Rapid Wean Protocol. Pt ABG 7.28, 45.9, 155, 21.4 on CPAP 10/5 40%. Per Dr Marvel Plan, RN gave pt HCO3 amp then ok to extubate. Pt able to speak name and has weak but acceptable cough. Pt doing well on IS. RT will continue to monitor.   Jesse Sans 06/22/2014, 6:29 PM

## 2014-06-22 NOTE — Care Management Note (Signed)
Case Management Note  Patient Details  Name: [name] MRN: [id number] Date of Birth: 1945-05-25  Subjective/Objective:    Pt admitted for cp. S/p cardiac catheterization where he was found to have 3 vessel CAD with an EF of 35%. S/p CABG 06-22-14.                Action/Plan: CM will continue to monitor for disposition needs.    Expected Discharge Date:                  Expected Discharge Plan:  [place]  In-House Referral:     Discharge planning Services  CM Consult  Post Acute Care Choice:    Choice offered to:     DME Arranged:    DME Agency:     HH Arranged:    [place] Agency:     Status of Service:  In process, will continue to follow  Medicare Important Message Given:    Date Medicare IM Given:    Medicare IM give by:    Date Additional Medicare IM Given:    Additional Medicare Important Message give by:     If discussed at [place] of Stay Meetings, dates discussed:  06-23-14  Additional Comments:  Bethena Roys, RN 06/22/2014, 3:10 PM

## 2014-06-22 NOTE — Progress Notes (Signed)
Initiated Rapid Heart Wean per protocol.

## 2014-06-22 NOTE — Progress Notes (Signed)
      [address]       [place] [address]             [phone]      Extubated  C/o incisional pain  BP 95/64 mmHg  Pulse 89  Temp(Src) 99.9 F (37.7 C) (Core (Comment))  Resp 19  Ht 5' 8"  (1.727 m)  Wt 184 lb (83.462 kg)  BMI 27.98 kg/m2  SpO2 99%  CI= 3.36   Intake/Output Summary (Last 24 hours) at 06/22/14 2003 Last data filed at 06/22/14 1900  Gross per 24 hour  Intake 6650.76 ml  Output   3825 ml  Net 2825.76 ml    Minimal CT output  Doing well early postop  Remo Lipps C. Roxan Hockey, MD [facility] [phone]

## 2014-06-22 NOTE — Addendum Note (Signed)
Addended by: Dennie Fetters on: 06/22/2014 05:36 PM   Modules accepted: Orders

## 2014-06-22 NOTE — Progress Notes (Signed)
Patient transferred to OR, report given. Partial Plates given to Wife in waiting room.

## 2014-06-22 NOTE — Brief Op Note (Addendum)
06/17/2014 - 06/22/2014  2:14 PM  PATIENT:  [name]  69 y.o. male  PRE-OPERATIVE DIAGNOSIS:  3 VESSEL CAD, s/p NSTEMI, PAROXYSMAL ATRIAL FIBRILLATION  POST-OPERATIVE DIAGNOSIS:  3 VESSEL CAD, s/p NSTEMI, PAROXYSMAL ATRIAL FIBRILLATION  PROCEDURE:   CABG X 5  LIMA TO LAD  SEQUENTIAL SVG TO DIAGONAL 1 AND RAMUS  SEQUENTIAL SVG TO PDA AND PLA EVH RIGHT LEG LEFT SIDED MAZE PROCEDURE LEFT ATRIAL APPENDAGE CLIP  SURGEON:  Surgeon(s) and Role:    * Melrose Nakayama, MD - Primary  PHYSICIAN ASSISTANT:   ASSISTANTS: WAYNE GOLD, PA-C   ANESTHESIA:   general  EBL:  Total I/O In: 9532 [I.V.:3750; Blood:861; IV Piggyback:250] Out: 2900 [Urine:1250; Blood:1650]  BLOOD ADMINISTERED:- CC CELLSAVER  DRAINS: 2 Chest Tube(s) in the LEFT PLEURA/ MEDIASTINUM   LOCAL MEDICATIONS USED:  NONE  SPECIMEN:  No Specimen  DISPOSITION OF SPECIMEN:  N/A  COUNTS:  YES  PLAN OF CARE: Admit to inpatient   PATIENT DISPOSITION:  ICU - intubated and hemodynamically stable.   Delay start of Pharmacological VTE agent (>24hrs) due to surgical blood loss or risk of bleeding: yes  GOOD TARGETS AND CONDUITS Maze Procedure  Radiofrequency:  Yes.  Bipolar: Yes.  Cut-and-sew:  No.  Cryo: No     Lesions (select all that apply):    2   Box Lesion

## 2014-06-22 NOTE — Interval H&P Note (Signed)
History and Physical Interval Note:  06/22/2014 7:08 AM  [name]  has presented today for surgery, with the diagnosis of CAD  The various methods of treatment have been discussed with the patient and family. After consideration of risks, benefits and other options for treatment, the patient has consented to  Procedure(s): CORONARY ARTERY BYPASS GRAFTING (CABG) (N/A) TRANSESOPHAGEAL ECHOCARDIOGRAM (TEE) (N/A) as a surgical intervention .  The patient's history has been reviewed, patient examined, no change in status, stable for surgery.  I have reviewed the patient's chart and labs.  Questions were answered to the patient's satisfaction.     Melrose Nakayama

## 2014-06-22 NOTE — Progress Notes (Signed)
Utilization review completed.  

## 2014-06-22 NOTE — OR Nursing (Signed)
1st call to SICU charge nurse 12:50 - , 2nd call to SICU nurse @ 13:15

## 2014-06-23 ENCOUNTER — Encounter (HOSPITAL_COMMUNITY): Payer: Self-pay | Admitting: Thoracic Surgery (Cardiothoracic Vascular Surgery)

## 2014-06-23 ENCOUNTER — Inpatient Hospital Stay (HOSPITAL_COMMUNITY): Payer: Medicare Other

## 2014-06-23 DIAGNOSIS — I472 Ventricular tachycardia: Secondary | ICD-10-CM

## 2014-06-23 DIAGNOSIS — I4729 Other ventricular tachycardia: Secondary | ICD-10-CM

## 2014-06-23 LAB — GLUCOSE, CAPILLARY
GLUCOSE-CAPILLARY: 109 mg/dL — AB (ref 65–99)
GLUCOSE-CAPILLARY: 111 mg/dL — AB (ref 65–99)
GLUCOSE-CAPILLARY: 116 mg/dL — AB (ref 65–99)
GLUCOSE-CAPILLARY: 121 mg/dL — AB (ref 65–99)
GLUCOSE-CAPILLARY: 126 mg/dL — AB (ref 65–99)
GLUCOSE-CAPILLARY: 131 mg/dL — AB (ref 65–99)
GLUCOSE-CAPILLARY: 139 mg/dL — AB (ref 65–99)
GLUCOSE-CAPILLARY: 139 mg/dL — AB (ref 65–99)
GLUCOSE-CAPILLARY: 154 mg/dL — AB (ref 65–99)
GLUCOSE-CAPILLARY: 162 mg/dL — AB (ref 65–99)
GLUCOSE-CAPILLARY: 95 mg/dL (ref 65–99)
Glucose-Capillary: 109 mg/dL — ABNORMAL HIGH (ref 65–99)
Glucose-Capillary: 110 mg/dL — ABNORMAL HIGH (ref 65–99)
Glucose-Capillary: 117 mg/dL — ABNORMAL HIGH (ref 65–99)
Glucose-Capillary: 121 mg/dL — ABNORMAL HIGH (ref 65–99)
Glucose-Capillary: 134 mg/dL — ABNORMAL HIGH (ref 65–99)
Glucose-Capillary: 135 mg/dL — ABNORMAL HIGH (ref 65–99)
Glucose-Capillary: 138 mg/dL — ABNORMAL HIGH (ref 65–99)
Glucose-Capillary: 139 mg/dL — ABNORMAL HIGH (ref 65–99)
Glucose-Capillary: 154 mg/dL — ABNORMAL HIGH (ref 65–99)

## 2014-06-23 LAB — MAGNESIUM
MAGNESIUM: 2.5 mg/dL — AB (ref 1.7–2.4)
MAGNESIUM: 2.3 mg/dL (ref 1.7–2.4)

## 2014-06-23 LAB — BASIC METABOLIC PANEL
Anion gap: 5 (ref 5–15)
BUN: 10 mg/dL (ref 6–20)
CHLORIDE: 111 mmol/L (ref 101–111)
CO2: 23 mmol/L (ref 22–32)
Calcium: 7.9 mg/dL — ABNORMAL LOW (ref 8.9–10.3)
Creatinine, Ser: 0.48 mg/dL — ABNORMAL LOW (ref 0.61–1.24)
GFR calc Af Amer: >60 mL/min (ref >60)
GFR calc non Af Amer: >60 mL/min (ref >60)
GLUCOSE: 128 mg/dL — AB (ref 65–99)
POTASSIUM: 3.9 mmol/L (ref 3.5–5.1)
Sodium: 139 mmol/L (ref 135–145)

## 2014-06-23 LAB — CBC
HCT: 33.6 % — ABNORMAL LOW (ref 39.0–52.0)
HEMATOCRIT: 33.1 % — AB (ref 39.0–52.0)
HEMOGLOBIN: 11.1 g/dL — AB (ref 13.0–17.0)
HEMOGLOBIN: 10.9 g/dL — AB (ref 13.0–17.0)
MCH: 30 pg (ref 26.0–34.0)
MCH: 30 pg (ref 26.0–34.0)
MCHC: 33 g/dL (ref 30.0–36.0)
MCHC: 32.9 g/dL (ref 30.0–36.0)
MCV: 90.8 fL (ref 78.0–100.0)
MCV: 91.2 fL (ref 78.0–100.0)
Platelets: 129 10*3/uL — ABNORMAL LOW (ref 150–400)
Platelets: 141 10*3/uL — ABNORMAL LOW (ref 150–400)
RBC: 3.7 MIL/uL — AB (ref 4.22–5.81)
RBC: 3.63 MIL/uL — ABNORMAL LOW (ref 4.22–5.81)
RDW: 13.6 % (ref 11.5–15.5)
RDW: 13.8 % (ref 11.5–15.5)
WBC: 11 10*3/uL — ABNORMAL HIGH (ref 4.0–10.5)
WBC: 14.2 10*3/uL — AB (ref 4.0–10.5)

## 2014-06-23 LAB — POCT I-STAT, CHEM 8
BUN: 15 mg/dL (ref 6–20)
CALCIUM ION: 1.13 mmol/L (ref 1.13–1.30)
CHLORIDE: 101 mmol/L (ref 101–111)
Creatinine, Ser: 1 mg/dL (ref 0.61–1.24)
GLUCOSE: 156 mg/dL — AB (ref 65–99)
HCT: 33 % — ABNORMAL LOW (ref 39.0–52.0)
Hemoglobin: 11.2 g/dL — ABNORMAL LOW (ref 13.0–17.0)
POTASSIUM: 4.5 mmol/L (ref 3.5–5.1)
Sodium: 135 mmol/L (ref 135–145)
TCO2: 22 mmol/L (ref 0–100)

## 2014-06-23 LAB — HEMOGLOBIN A1C
HEMOGLOBIN A1C: 8.9 % — AB (ref 4.8–5.6)
Mean Plasma Glucose: 209 mg/dL

## 2014-06-23 LAB — CREATININE, SERUM
Creatinine, Ser: 1.07 mg/dL (ref 0.61–1.24)
GFR calc Af Amer: >60 mL/min (ref >60)
GFR calc non Af Amer: >60 mL/min (ref >60)

## 2014-06-23 MED ORDER — DIFLUPREDNATE 0.05 % OP EMUL
1.0000 [drp] | Freq: Three times a day (TID) | OPHTHALMIC | Status: DC
  Administered 2014-06-23 – 2014-06-27 (×11): 1 [drp] via OPHTHALMIC

## 2014-06-23 MED ORDER — KETOROLAC TROMETHAMINE 30 MG/ML IJ SOLN
30.0000 mg | Freq: Four times a day (QID) | INTRAMUSCULAR | Status: AC
  Administered 2014-06-23 – 2014-06-24 (×4): 30 mg via INTRAVENOUS
  Filled 2014-06-23 (×4): qty 1

## 2014-06-23 MED ORDER — RAMIPRIL 5 MG PO CAPS
5.0000 mg | ORAL_CAPSULE | Freq: Every day | ORAL | Status: DC
  Administered 2014-06-23 – 2014-06-27 (×5): 5 mg via ORAL
  Filled 2014-06-23 (×5): qty 1

## 2014-06-23 MED ORDER — CETYLPYRIDINIUM CHLORIDE 0.05 % MT LIQD
7.0000 mL | Freq: Two times a day (BID) | OROMUCOSAL | Status: DC
  Administered 2014-06-23 – 2014-06-24 (×3): 7 mL via OROMUCOSAL

## 2014-06-23 MED ORDER — FUROSEMIDE 10 MG/ML IJ SOLN
40.0000 mg | Freq: Once | INTRAMUSCULAR | Status: AC
  Administered 2014-06-23: 40 mg via INTRAVENOUS
  Filled 2014-06-23: qty 4

## 2014-06-23 MED ORDER — INSULIN ASPART 100 UNIT/ML ~~LOC~~ SOLN
0.0000 [IU] | SUBCUTANEOUS | Status: DC
Start: 2014-06-23 — End: 2014-06-24
  Administered 2014-06-23: 2 [IU] via SUBCUTANEOUS
  Administered 2014-06-23: 4 [IU] via SUBCUTANEOUS
  Administered 2014-06-23 – 2014-06-24 (×3): 2 [IU] via SUBCUTANEOUS

## 2014-06-23 MED ORDER — INSULIN DETEMIR 100 UNIT/ML ~~LOC~~ SOLN
40.0000 [IU] | Freq: Every day | SUBCUTANEOUS | Status: DC
  Filled 2014-06-23: qty 0.4

## 2014-06-23 MED ORDER — ENOXAPARIN SODIUM 40 MG/0.4ML ~~LOC~~ SOLN
40.0000 mg | Freq: Every day | SUBCUTANEOUS | Status: AC
  Administered 2014-06-23 – 2014-06-26 (×4): 40 mg via SUBCUTANEOUS
  Filled 2014-06-23 (×4): qty 0.4

## 2014-06-23 MED ORDER — TAMSULOSIN HCL 0.4 MG PO CAPS
0.4000 mg | ORAL_CAPSULE | Freq: Every day | ORAL | Status: DC | PRN
  Filled 2014-06-23: qty 1

## 2014-06-23 MED ORDER — POTASSIUM CHLORIDE 10 MEQ/50ML IV SOLN
10.0000 meq | INTRAVENOUS | Status: AC
  Administered 2014-06-23 (×3): 10 meq via INTRAVENOUS
  Filled 2014-06-23: qty 50

## 2014-06-23 MED ORDER — INSULIN DETEMIR 100 UNIT/ML ~~LOC~~ SOLN
40.0000 [IU] | Freq: Every day | SUBCUTANEOUS | Status: DC
  Administered 2014-06-23: 40 [IU] via SUBCUTANEOUS
  Filled 2014-06-23 (×2): qty 0.4

## 2014-06-23 MED FILL — Potassium Chloride Inj 2 mEq/ML: INTRAVENOUS | Qty: 40 | Status: AC

## 2014-06-23 MED FILL — Magnesium Sulfate Inj 50%: INTRAMUSCULAR | Qty: 10 | Status: AC

## 2014-06-23 MED FILL — Heparin Sodium (Porcine) Inj 1000 Unit/ML: INTRAMUSCULAR | Qty: 30 | Status: AC

## 2014-06-23 NOTE — Op Note (Signed)
NAMECALLAN, NORDEN NO.:  [id number]  MEDICAL RECORD NO.:  [id number]  LOCATION:  2S12C                        FACILITY:  [facility]  PHYSICIAN:  Revonda Standard. Roxan Hockey, M.D.DATE OF BIRTH:  Dec 05, 1945  DATE OF PROCEDURE:  06/22/2014 DATE OF DISCHARGE:                              OPERATIVE REPORT   PREOPERATIVE DIAGNOSIS:  Severe three-vessel disease status post non ST elevation myocardial infarction, paroxysmal atrial fibrillation.  POSTOPERATIVE DIAGNOSIS:  Severe three-vessel disease status post non ST elevation myocardial infarction, paroxysmal atrial fibrillation.  PROCEDURE:  Median sternotomy, extracorporeal circulation Coronary artery bypass grafting x5   Left internal mammary artery to left anterior descending  Saphenous vein graft sequentially to diagonal 1 and ramus   Intermedius  Sequential saphenous vein graft to posterior descending and   Posterolateral Endoscopic vein harvest right leg Left-sided maze procedure Left atrial appendage clip.  SURGEON:  Revonda Standard. Roxan Hockey, M.D.  ASSISTANT:  John Giovanni, PA-C  ANESTHESIA:  General.  FINDINGS:  Transesophageal echocardiography revealed ejection fraction of 35% with no significant valvular pathology.  Good quality conduits and good quality targets.  Ramus intermedius intramyocardial.  CLINICAL NOTE:  Mr. Hoard is a 69 year old gentleman with a known history of paroxysmal atrial fibrillation and coronary artery disease. He had recently had a stent placed in his right coronary.  He presented with recurrent unstable coronary syndrome and ruled in for a non-ST- elevation MI.  On catheterization, he had three-vessel disease and an in- stent restenosis in the RCA, his ejection fraction was 35%.  He was advised to undergo coronary artery bypass grafting.  He also requested a maze procedure for his paroxysmal atrial fibrillation.  The indications, risks, benefits, and alternatives were discussed in  detail with the patient.  He understood and accepted the risks and agreed to proceed.  OPERATIVE NOTE:  Mr. Sweigert was brought to the preoperative holding area on June 22, 2014.  There Anesthesia placed a Swan-Ganz catheter and arterial blood pressure monitoring line.  He was taken to the operating room, anesthetized, and intubated.  Intravenous antibiotics were administered.  A Foley catheter was placed.  Transesophageal echocardiography was performed.  Findings as previously noted.  The chest, abdomen, and legs were prepped and draped in usual sterile fashion.  An incision was made in the medial aspect of the right leg at the level of the greater saphenous vein was identified.  It was harvested endoscopically from groin to upper calf.  It was a good quality vessel.  2000 units of heparin was administered during the vessel harvest.  Simultaneously, a median sternotomy was performed and the left internal mammary artery was harvested using standard technique. It was a good quality vessel with excellent flow when divided distally. After harvesting the conduits, the remainder of the full heparin dose was given.  The pericardium was opened.  Adequate anticoagulation was confirmed with ACT measurement.  The aorta was cannulated via concentric 2-0 Ethibond pledgeted pursestring sutures.  A 31-French malleable cannula was placed via pursestring suture in the right atrial appendage and directed into the superior vena cava.  Cardiopulmonary bypass was initiated.  Carbon dioxide was insufflated into the operative field. A 36- [place]  malleable cannula was placed via pursestring suture in the inferior aspect of the right atrium and directed into the inferior vena cava.  This was connected to the bypass circuit.  Flows were maintained per protocol.  The patient was cooled to 32 degrees Celsius.  The coronary arteries were inspected and anastomotic sites were chosen.  The conduits were inspected and  cut to length. The Medtronic bipolar radiofrequency ablation device was used to create ablation lines on the base of the left atrial appendage and around the right atrium near the confluence of the left-sided pulmonary veins.  With each ablation, parallel ablation lines were placed a couple of millimeters apart.  The ablation was continued to transmurality at each site. A foam pad was placed in the pericardium to insulate the heart and protect the left phrenic nerve. A temperature probe was placed in the myocardial septum and a cardioplegia cannula was placed in the ascending aorta.   The aorta was crossclamped.  The left ventricle was emptied via the aortic root vent.  Cardiac arrest then was achieved with combination of cold antegrade and retrograde blood cardioplegia and topical iced saline.  An initial 500 mL of cardioplegia was administered antegrade. There was a rapid diastolic arrest and good septal cooling.  An additional 250 mL of cardioplegia was administered retrograde, there was additional cooling to 10 degrees Celsius.  A reversed saphenous vein graft was placed sequentially to the posterior descending and posterolateral branch of the right coronary.  These were grafted separately as there was plaque at the origin of the posterior descending.  Both were 1.5-mm good quality targets and side-to-side anastomosis was performed to the posterior descending in an end-to-side to the posterolateral, both were done with running 7-0 Prolene sutures. All anastomoses were probed proximally and distally at their completion to ensure patency.  Cardioplegia was administered down the graft.  There was good flow and good hemostasis at both anastomoses.  A reversed saphenous vein graft was placed sequentially to the first diagonal and ramus intermedius.  The first diagonal was a 1.3-mm good quality target.  The ramus intermedius was a 1.5-mm good quality target.  It was intramyocardial just  before it bifurcated at which point it was grafted.  A side-to-side was done to the first diagonal and an end-to-side to the ramus, both were done with running 7-0 Prolene sutures.  Again, both anastomoses probed easily in both directions and there was good flow and good hemostasis with cardioplegia administration.  Additional cardioplegia was administered.  The left internal mammary artery was brought through a window in the pericardium.  The distal end was beveled.  It was anastomosed end-to-side to the distal LAD.  The mammary was a 2.5-mm good quality conduit.  The LAD was a 2-mm good quality target.  An end-to-side anastomosis was performed with a running 8-0 Prolene suture.  At the completion of the mammary to LAD anastomosis, the bulldog clamp was briefly removed to inspect for hemostasis.  Rapid septal rewarming was noted.  The bulldog clamp was replaced.  The mammary pedicle was tacked to the epicardial surface of the heart with 6-0 Prolene sutures.  The interatrial groove was dissected out and a left atriotomy was performed.  The isolation of the right pulmonary veins was completed with a bipolar device from superiorly and inferiorly, again with 2 parallel ablation lines.  Next, the monopolar device was used to create connecting lesions from the ablation line at the base of the appendage to the ablation line  at the confluence of the left-sided pulmonary veins.  The ablation lines between the right and left-sided pulmonary veins were connected with a bipolar device, and finally a connecting lesion was made to the 4 o'clock position on the mitral anulus.  Rewarming was begun.  The left atriotomy was closed in 2 layers with running 4-0 Prolene suture.  The first was a horizontal mattress suture. De-airing was performed and then the second suture line was a simple 4-0 Prolene suture.   The cardioplegia cannula was removed from the ascending aorta.  The proximal vein graft anastomoses  then were performed to 4.0 mm punch aortotomies with running 6-0 Prolene sutures.  An attempt was made to give retrograde cardioplegia, however, the catheter had become dislodged.  Lidocaine was administered.  The patient was placed in Trendelenburg position.  The bulldog clamp was removed from the left mammary artery.  Rapid septal rewarming was again noted.  The aortic root and left ventricle were de-aired and the aortic crossclamp was removed.  Total crossclamp time was 120 minutes.  The patient spontaneously resumed sinus bradycardia and did not require defibrillation.  While rewarming was completed, all proximal and distal anastomoses were inspected for hemostasis.  Epicardial pacing wires were placed on the right ventricle and right atrium and DDD pacing was initiated at 90 beats per minute.  Retrograde cardioplegia cannula was removed.  The superior vena cava cannula was redirected into the right atrium and the inferior vena cava cannula was removed.  There was good hemostasis at the site.  When the patient had rewarmed to a core temperature of 37 degrees Celsius, a dopamine infusion was initiated at 3 mcg/kg/minute. The patient then weaned from cardiopulmonary bypass on the first attempt without difficulty with a total bypass time of 174 minutes.  The initial cardiac index was greater than 2 liters/min/m2 and the patient remained hemodynamically stable throughout the postbypass. Postbypass transesophageal echocardiography was not significantly changed from the prebypass study.  A test dose of protamine was administered and was well tolerated.  The atrial and aortic cannulae were removed.  The remainder of the protamine was administered without incident.  The chest was irrigated with warm saline.  Hemostasis was achieved.  The pericardium was reapproximated with interrupted 3-0 silk sutures.  It came together easily without tension or kinking of the underlying grafts.  A left  pleural and mediastinal chest tubes were placed through separate subcostal incisions.  The sternum was closed with a combination of single and double heavy-gauge stainless steel wires.  The pectoralis fascia, subcutaneous tissue, and skin were closed in standard fashion.  All sponge, needle, and instrument counts were correct at the end of the procedure.  The patient was taken from the operating room to the surgical intensive care unit in good condition.     Revonda Standard Roxan Hockey, M.D.     SCH/MEDQ  D:  06/22/2014  T:  06/22/2014  Job:  [id number]

## 2014-06-23 NOTE — Progress Notes (Signed)
Patient ID: [name], male   DOB: [birth year], 69 y.o.   MRN: [id number] EVENING ROUNDS NOTE :     [address]       [place] [address]             [phone]                 1 Day Post-Op Procedure(s) (LRB): CORONARY ARTERY BYPASS GRAFTING (CABG) x five, using left internal mammary artery, and right leg greater saphenous vein harvested endoscopically (N/A) TRANSESOPHAGEAL ECHOCARDIOGRAM (TEE) (N/A) MAZE (N/A)  Total Length of Stay:  LOS: 5 days  BP 131/67 mmHg  Pulse 68  Temp(Src) 98.3 F (36.8 C) (Oral)  Resp 19  Ht 5\' 8"  (1.727 m)  Wt 190 lb 11.2 oz (86.5 kg)  BMI 29.00 kg/m2  SpO2 99%  .Intake/Output      06/21 0701 - 06/22 0700   P.O.    I.V. (mL/kg) 363.3 (4.2)   Blood    IV Piggyback 200   Total Intake(mL/kg) 563.3 (6.5)   Urine (mL/kg/hr) 1010 (0.9)   Blood    Chest Tube 50 (0)   Total Output 1060   Net -496.7         . sodium chloride    . sodium chloride    . sodium chloride Stopped (06/23/14 1005)  . dexmedetomidine Stopped (06/22/14 1803)  . DOPamine Stopped (06/23/14 0500)  . insulin (NOVOLIN-R) infusion Stopped (06/23/14 1400)  . lactated ringers 20 mL/hr at 06/22/14 1500  . lactated ringers 20 mL/hr at 06/22/14 1500  . nitroGLYCERIN Stopped (06/23/14 1716)  . phenylephrine (NEO-SYNEPHRINE) Adult infusion Stopped (06/22/14 1345)     Lab Results  Component Value Date   WBC 14.2* 06/23/2014   HGB 10.9* 06/23/2014   HCT 33.1* 06/23/2014   PLT 141* 06/23/2014   GLUCOSE 156* 06/23/2014   CHOL 160 06/18/2014   TRIG 121 06/18/2014   HDL 39* 06/18/2014   LDLCALC 97 06/18/2014   ALT 41 06/21/2014   AST 27 06/21/2014   NA 135 06/23/2014   K 4.5 06/23/2014   CL 101 06/23/2014   CREATININE 1.07 06/23/2014   BUN 15 06/23/2014   CO2 23 06/23/2014   TSH 0.748 06/18/2014   PSA 1.11 02/18/2014   INR 1.35 06/22/2014   HGBA1C 8.9* 06/22/2014   Stable day walked 300 feet  Grace Isaac MD  Beeper [phone] Office  [phone] 06/23/2014 7:38 PM

## 2014-06-23 NOTE — Progress Notes (Signed)
1 Day Post-Op Procedure(s) (LRB): CORONARY ARTERY BYPASS GRAFTING (CABG) x five, using left internal mammary artery, and right leg greater saphenous vein harvested endoscopically (N/A) TRANSESOPHAGEAL ECHOCARDIOGRAM (TEE) (N/A) MAZE (N/A) Subjective: C/o incisional pain, denies nausea  Objective: Vital signs in last 24 hours: Temp:  [98.6 F (37 C)-100.6 F (38.1 C)] 98.8 F (37.1 C) (06/21 0900) Pulse Rate:  [69-92] 69 (06/21 0900) Cardiac Rhythm:  [-] Atrial paced (06/21 0800) Resp:  [11-27] 16 (06/21 0900) BP: (83-126)/(55-71) 121/71 mmHg (06/21 0800) SpO2:  [95 %-100 %] 100 % (06/21 0900) Arterial Line BP: (85-184)/(47-76) 184/76 mmHg (06/21 0900) FiO2 (%):  [40 %-50 %] 40 % (06/20 1600) Weight:  [190 lb 11.2 oz (86.5 kg)] 190 lb 11.2 oz (86.5 kg) (06/21 0500)  Hemodynamic parameters for last 24 hours: PAP: (26-89)/(16-30) 37/29 mmHg CO:  [3.4 L/min-6.6 L/min] 4.7 L/min CI:  [1.7 L/min/m2-3.4 L/min/m2] 2.4 L/min/m2  Intake/Output from previous day: 06/20 0701 - 06/21 0700 In: 7470.9 [P.O.:200; I.V.:5059.9; Blood:861; IV Piggyback:1350] Out: 4920 [Urine:2930; Blood:1650; Chest Tube:340] Intake/Output this shift: Total I/O In: 126.4 [I.V.:126.4] Out: 100 [Urine:100]  General appearance: alert, cooperative and no distress Neurologic: intact Heart: regular rate and rhythm Lungs: diminished breath sounds bibasilar Abdomen: normal findings: soft, non-tender  Lab Results:  Recent Labs  06/22/14 2006 06/23/14 0400  WBC 9.7 11.0*  HGB 11.7* 11.1*  HCT 34.8* 33.6*  PLT 127* 129*   BMET:  Recent Labs  06/21/14 0516  06/22/14 1957 06/22/14 2006 06/23/14 0400  NA 139  < > 142  --  139  K 3.7  < > 4.2  --  3.9  CL 107  < > 107  --  111  CO2 25  --   --   --  23  GLUCOSE 191*  < > 118*  --  128*  BUN 12  < > 12  --  10  CREATININE 0.86  < > 0.80 0.80 0.48*  CALCIUM 8.9  --   --   --  7.9*  < > = values in this interval not displayed.  PT/INR:  Recent Labs  06/22/14 1350  LABPROT 16.8*  INR 1.35   ABG    Component Value Date/Time   PHART 7.345* 06/22/2014 1924   HCO3 22.1 06/22/2014 1924   TCO2 21 06/22/2014 1957   ACIDBASEDEF 3.0* 06/22/2014 1924   O2SAT 97.0 06/22/2014 1924   CBG (last 3)   Recent Labs  06/22/14 2132 06/22/14 2229 06/22/14 2334  GLUCAP 154* 131* 109*    Assessment/Plan: S/P Procedure(s) (LRB): CORONARY ARTERY BYPASS GRAFTING (CABG) x five, using left internal mammary artery, and right leg greater saphenous vein harvested endoscopically (N/A) TRANSESOPHAGEAL ECHOCARDIOGRAM (TEE) (N/A) MAZE (N/A) POD # 1 CABG, maze, LAA clip  CV- had a run of non sustained VT last PM, otherwise has been completely stable  A  Paced, will turn down pacer  Dc swan, A line  RESP_ IS  RENAL- creatinine OK, diurese  ENDO- Still on insulin drip- will transition to SSI  Anemia secondary to ABL_ mild, follow  SCD + enoxaparin for DVT prophylaxis  DC CT  OOB, ambulate   LOS: 5 days    [name] 06/23/2014

## 2014-06-23 NOTE — Progress Notes (Signed)
SUBJECTIVE:  No complaints  OBJECTIVE:   Vitals:   Filed Vitals:   06/23/14 0900 06/23/14 1000 06/23/14 1100 06/23/14 1200  BP: 127/95 138/76 109/60 94/53  Pulse: 69 62 73 50  Temp: 98.8 F (37.1 C) 98.8 F (37.1 C) 99 F (37.2 C) 99 F (37.2 C)  TempSrc:      Resp: 16 18 17 19   Height:      Weight:      SpO2: 100% 100% 96% 97%   I&O's:   Intake/Output Summary (Last 24 hours) at 06/23/14 1304 Last data filed at 06/23/14 1202  Gross per 24 hour  Intake 6597.34 ml  Output   4520 ml  Net 2077.34 ml   TELEMETRY: Reviewed telemetry pt in sinus bradycardia     PHYSICAL EXAM General: Well developed, well nourished, in no acute distress Head: Eyes PERRLA, No xanthomas.   Normal cephalic and atramatic  Lungs:   Clear bilaterally to auscultation and percussion. Heart:   HRRR S1 S2 Pulses are 2+ & equal. Abdomen: Bowel sounds are positive, abdomen soft and non-tender without masses Extremities:   No clubbing, cyanosis or edema.  DP +1 Neuro: Alert and oriented X 3. Psych:  Good affect, responds appropriately   LABS: Basic Metabolic Panel:  Recent Labs  06/21/14 0516  06/22/14 1957 06/22/14 2006 06/23/14 0400  NA 139  < > 142  --  139  K 3.7  < > 4.2  --  3.9  CL 107  < > 107  --  111  CO2 25  --   --   --  23  GLUCOSE 191*  < > 118*  --  128*  BUN 12  < > 12  --  10  CREATININE 0.86  < > 0.80 0.80 0.48*  CALCIUM 8.9  --   --   --  7.9*  MG  --   --   --  2.9* 2.5*  < > = values in this interval not displayed. Liver Function Tests:  Recent Labs  06/21/14 0516  AST 27  ALT 41  ALKPHOS 52  BILITOT 0.6  PROT 6.2*  ALBUMIN 3.2*   No results for input(s): LIPASE, AMYLASE in the last 72 hours. CBC:  Recent Labs  06/22/14 2006 06/23/14 0400  WBC 9.7 11.0*  HGB 11.7* 11.1*  HCT 34.8* 33.6*  MCV 89.5 90.8  PLT 127* 129*   Cardiac Enzymes: No results for input(s): CKTOTAL, CKMB, CKMBINDEX, TROPONINI in the last 72 hours. BNP: Invalid input(s):  POCBNP D-Dimer: No results for input(s): DDIMER in the last 72 hours. Hemoglobin A1C:  Recent Labs  06/22/14 0450  HGBA1C 8.9*   Fasting Lipid Panel: No results for input(s): CHOL, HDL, LDLCALC, TRIG, CHOLHDL, LDLDIRECT in the last 72 hours. Thyroid Function Tests: No results for input(s): TSH, T4TOTAL, T3FREE, THYROIDAB in the last 72 hours.  Invalid input(s): FREET3 Anemia Panel: No results for input(s): VITAMINB12, FOLATE, FERRITIN, TIBC, IRON, RETICCTPCT in the last 72 hours. Coag Panel:   Lab Results  Component Value Date   INR 1.35 06/22/2014   INR 1.13 06/18/2014   INR 1.18 06/18/2014    RADIOLOGY: Dg Chest 2 View  06/17/2014   CLINICAL DATA:  Acute onset of central chest pain. Initial encounter.  EXAM: CHEST  2 VIEW  COMPARISON:  Chest radiograph performed 11/22/2007  FINDINGS: The lungs are well-aerated and clear. There is no evidence of focal opacification, pleural effusion or pneumothorax.  The heart is borderline normal  in size. No acute osseous abnormalities are seen.  IMPRESSION: No acute cardiopulmonary process seen.   Electronically Signed   By: Garald Balding M.D.   On: 06/17/2014 20:26   Dg Chest Port 1 View  06/23/2014   CLINICAL DATA:  Coronary artery disease ; status post coronary artery bypass grafting  EXAM: PORTABLE CHEST - 1 VIEW  COMPARISON:  June 22, 2014  FINDINGS: Endotracheal tube and nasogastric tube have been removed. Mediastinal drain and left chest tube remain. Swan-Ganz catheter tip is in the main pulmonary outflow tract. No pneumothorax. There is patchy consolidation in the left lower lobe. The lungs are otherwise clear. The heart is borderline enlarged with pulmonary vascular within normal limits. No adenopathy.  IMPRESSION: Tube and catheter positions as described without pneumothorax. Patchy consolidation left base. Lungs otherwise clear. No change in cardiac silhouette.   Electronically Signed   By: Lowella Grip III M.D.   On: 06/23/2014  07:57   Dg Chest Port 1 View  06/22/2014   CLINICAL DATA:  Postop CABG  EXAM: PORTABLE CHEST - 1 VIEW  COMPARISON:  06/17/2014  FINDINGS: There is a new endotracheal tube with tip just below the clavicular heads. An orogastric tube enters the stomach.  Right IJ approach Swan-Ganz catheter with tip near the main pulmonary artery. Thoracic drains noted. There are new changes of CABG and left atrial exclusion.  Stable heart size and mediastinal contours for technique. Low lung volumes with mild retrocardiac atelectasis. There is mild pleural thickening at the left apex, less than the interspace, which could be from prominent extrapleural flat in the setting of low volumes, or postoperative hematoma.  IMPRESSION: 1. Unremarkable positioning of tubes and central line. 2. New mild extrapleural thickening at the left apex which could be from lower volumes or small hematoma. 3. Mild retrocardiac atelectasis.   Electronically Signed   By: Monte Fantasia M.D.   On: 06/22/2014 14:54   Assessment/Plan:   1.  NSTEMI (non-ST elevated myocardial infarction) 2.   ASCAD s/p HSRA/PCI-stent to RCA x 2 1 month ago with recurrent angina and NSTEMI now with cath showing early restenosis of RCA stent and significant LAD disease by FFR and severe LV dysfunction s/p CABG POD #1.  Continue ASA/statin/BB 3.  DM type 2 (diabetes mellitus, type 2)   ATRIAL FIBRILLATION, PAROXYSMAL- maintaining a sinus bradycardia.  Had been on Flecainide in the past but stopped with diagnosis of CAD.  This patients CHA2DS2-VASc Score and unadjusted Ischemic Stroke Rate (% per year) is equal to 4 % stroke rate/year from a score of 4. Above score calculated as 1 point each if present [CHF, HTN, DM, Vascular=MI/PAD/Aortic Plaque, Age if 65-74, or Male]. Above score calculated as 2 points each if present [Age > 75, or Stroke/TIA/TE].  Patient has refused anticoagulation in the past but now would recommend reconsideration. Would be a candidate for  a NOAC agent. 4.  Hypertension - controlled but soft 5.   Nonsustained ventricular tachycardia - short run last PM but none since.  Last EF 40-45%.   6.  Hyperlipidemia 7.   Ischemic DCM with EF 40-45% - continue BB and ACE I as BP tolerates    Sueanne Margarita, MD  06/23/2014  1:04 PM

## 2014-06-24 ENCOUNTER — Inpatient Hospital Stay (HOSPITAL_COMMUNITY): Payer: Medicare Other

## 2014-06-24 LAB — BASIC METABOLIC PANEL
Anion gap: 8 (ref 5–15)
BUN: 21 mg/dL — AB (ref 6–20)
CO2: 26 mmol/L (ref 22–32)
CREATININE: 1.07 mg/dL (ref 0.61–1.24)
Calcium: 7.7 mg/dL — ABNORMAL LOW (ref 8.9–10.3)
Chloride: 100 mmol/L — ABNORMAL LOW (ref 101–111)
Glucose, Bld: 163 mg/dL — ABNORMAL HIGH (ref 65–99)
Potassium: 4 mmol/L (ref 3.5–5.1)
Sodium: 134 mmol/L — ABNORMAL LOW (ref 135–145)

## 2014-06-24 LAB — CBC
HCT: 29.6 % — ABNORMAL LOW (ref 39.0–52.0)
Hemoglobin: 9.7 g/dL — ABNORMAL LOW (ref 13.0–17.0)
MCH: 30.1 pg (ref 26.0–34.0)
MCHC: 32.8 g/dL (ref 30.0–36.0)
MCV: 91.9 fL (ref 78.0–100.0)
PLATELETS: 125 10*3/uL — AB (ref 150–400)
RBC: 3.22 MIL/uL — ABNORMAL LOW (ref 4.22–5.81)
RDW: 13.9 % (ref 11.5–15.5)
WBC: 8.8 10*3/uL (ref 4.0–10.5)

## 2014-06-24 LAB — GLUCOSE, CAPILLARY
GLUCOSE-CAPILLARY: 134 mg/dL — AB (ref 65–99)
Glucose-Capillary: 149 mg/dL — ABNORMAL HIGH (ref 65–99)
Glucose-Capillary: 136 mg/dL — ABNORMAL HIGH (ref 65–99)
Glucose-Capillary: 145 mg/dL — ABNORMAL HIGH (ref 65–99)
Glucose-Capillary: 121 mg/dL — ABNORMAL HIGH (ref 65–99)

## 2014-06-24 MED ORDER — ALUM & MAG HYDROXIDE-SIMETH 200-200-20 MG/5ML PO SUSP: 15.0000 mL | ORAL | Status: DC | PRN

## 2014-06-24 MED ORDER — MOVING RIGHT ALONG BOOK
Freq: Once | Status: AC
  Administered 2014-06-24: 15:00:00
  Filled 2014-06-24: qty 1

## 2014-06-24 MED ORDER — SODIUM CHLORIDE 0.9 % IJ SOLN
3.0000 mL | Freq: Two times a day (BID) | INTRAMUSCULAR | Status: DC
  Administered 2014-06-24 – 2014-06-27 (×7): 3 mL via INTRAVENOUS

## 2014-06-24 MED ORDER — MAGNESIUM HYDROXIDE 400 MG/5ML PO SUSP
30.0000 mL | Freq: Every day | ORAL | Status: DC | PRN
Start: 2014-06-24 — End: 2014-06-27

## 2014-06-24 MED ORDER — INSULIN DETEMIR 100 UNIT/ML ~~LOC~~ SOLN
25.0000 [IU] | Freq: Every day | SUBCUTANEOUS | Status: DC
  Administered 2014-06-24: 25 [IU] via SUBCUTANEOUS
  Filled 2014-06-24 (×2): qty 0.25

## 2014-06-24 MED ORDER — INSULIN ASPART 100 UNIT/ML ~~LOC~~ SOLN
0.0000 [IU] | Freq: Three times a day (TID) | SUBCUTANEOUS | Status: DC
  Administered 2014-06-24 (×3): 2 [IU] via SUBCUTANEOUS
  Administered 2014-06-25: 5 [IU] via SUBCUTANEOUS
  Administered 2014-06-25: 3 [IU] via SUBCUTANEOUS
  Administered 2014-06-26: 2 [IU] via SUBCUTANEOUS
  Administered 2014-06-26 (×2): 3 [IU] via SUBCUTANEOUS

## 2014-06-24 MED ORDER — FUROSEMIDE 40 MG PO TABS
40.0000 mg | ORAL_TABLET | Freq: Every day | ORAL | Status: DC
  Administered 2014-06-24 – 2014-06-27 (×4): 40 mg via ORAL
  Filled 2014-06-24 (×4): qty 1

## 2014-06-24 MED ORDER — POTASSIUM CHLORIDE CRYS ER 20 MEQ PO TBCR
20.0000 meq | EXTENDED_RELEASE_TABLET | Freq: Two times a day (BID) | ORAL | Status: DC
  Administered 2014-06-24 – 2014-06-27 (×7): 20 meq via ORAL
  Filled 2014-06-24 (×11): qty 1

## 2014-06-24 MED ORDER — GUAIFENESIN-DM 100-10 MG/5ML PO SYRP: 15.0000 mL | ORAL_SOLUTION | ORAL | Status: DC | PRN

## 2014-06-24 MED ORDER — ZOLPIDEM TARTRATE 5 MG PO TABS: 5.0000 mg | ORAL_TABLET | Freq: Every evening | ORAL | Status: DC | PRN

## 2014-06-24 MED ORDER — METFORMIN HCL 500 MG PO TABS
500.0000 mg | ORAL_TABLET | Freq: Two times a day (BID) | ORAL | Status: DC
  Administered 2014-06-24 – 2014-06-26 (×5): 500 mg via ORAL
  Filled 2014-06-24 (×7): qty 1

## 2014-06-24 MED ORDER — SODIUM CHLORIDE 0.9 % IJ SOLN: 3.0000 mL | INTRAMUSCULAR | Status: DC | PRN

## 2014-06-24 MED ORDER — GLYBURIDE 5 MG PO TABS
5.0000 mg | ORAL_TABLET | Freq: Two times a day (BID) | ORAL | Status: DC
  Administered 2014-06-24 – 2014-06-26 (×5): 5 mg via ORAL
  Filled 2014-06-24 (×9): qty 1

## 2014-06-24 MED ORDER — INSULIN ASPART 100 UNIT/ML ~~LOC~~ SOLN: 0.0000 [IU] | Freq: Every day | SUBCUTANEOUS | Status: DC

## 2014-06-24 MED ORDER — SODIUM CHLORIDE 0.9 % IV SOLN: 250.0000 mL | INTRAVENOUS | Status: DC | PRN

## 2014-06-24 MED ORDER — CILIDINIUM-CHLORDIAZEPOXIDE 2.5-5 MG PO CAPS
1.0000 | ORAL_CAPSULE | Freq: Three times a day (TID) | ORAL | Status: DC
  Administered 2014-06-25 – 2014-06-27 (×8): 1 via ORAL
  Filled 2014-06-24 (×10): qty 1

## 2014-06-24 MED ORDER — GLYBURIDE-METFORMIN 5-500 MG PO TABS: 1.0000 | ORAL_TABLET | Freq: Two times a day (BID) | ORAL | Status: DC

## 2014-06-24 NOTE — Progress Notes (Signed)
Report called to Almedia Balls RN. Pt to transfer to 2W-11 via ambulation. Meds in chart, family and belongings at bedside. Vs stable at time of transfer. No questions or complaints at this time. Bedside handoff to [place]. Liliya Fullenwider L

## 2014-06-24 NOTE — Progress Notes (Signed)
1445 Pt trying to eat lunch but having a hard time eating the heavy foods. Got pt a fruit cup. Pt stated he walked over. Family member came in and both with tears in eyes so I encouraged pt to walk again later with staff. Will follow up tomorrow.Graylon Good RN BSN 06/24/2014 2:45 PM

## 2014-06-24 NOTE — Progress Notes (Signed)
2 Days Post-Op Procedure(s) (LRB): CORONARY ARTERY BYPASS GRAFTING (CABG) x five, using left internal mammary artery, and right leg greater saphenous vein harvested endoscopically (N/A) TRANSESOPHAGEAL ECHOCARDIOGRAM (TEE) (N/A) MAZE (N/A) Subjective: Some incisional pain, but better No nausea  Objective: Vital signs in last 24 hours: Temp:  [98.3 F (36.8 C)-99 F (37.2 C)] 98.8 F (37.1 C) (06/21 2345) Pulse Rate:  [50-73] 63 (06/22 0700) Cardiac Rhythm:  [-] Normal sinus rhythm (06/22 0700) Resp:  [16-26] 19 (06/22 0700) BP: (93-138)/(53-95) 114/59 mmHg (06/22 0700) SpO2:  [92 %-100 %] 99 % (06/22 0700) Arterial Line BP: (107-184)/(51-76) 144/63 mmHg (06/21 1400) Weight:  [197 lb (89.359 kg)] 197 lb (89.359 kg) (06/22 0635)  Hemodynamic parameters for last 24 hours: PAP: (29-38)/(21-30) 30/24 mmHg CO:  [4.7 L/min] 4.7 L/min CI:  [2.4 L/min/m2] 2.4 L/min/m2  Intake/Output from previous day: 06/21 0701 - 06/22 0700 In: 853.3 [I.V.:603.3; IV Piggyback:250] Out: 1410 [Urine:1360; Chest Tube:50] Intake/Output this shift:    General appearance: alert, cooperative and no distress Neurologic: intact Heart: regular rate and rhythm Lungs: diminished breath sounds bibasilar Abdomen: soft, non-tender; bowel sounds normal; no masses,  no organomegaly  Lab Results:  Recent Labs  06/23/14 1700 06/24/14 0512  WBC 14.2* 8.8  HGB 10.9* 9.7*  HCT 33.1* 29.6*  PLT 141* 125*   BMET:  Recent Labs  06/23/14 0400 06/23/14 1658 06/23/14 1700 06/24/14 0512  NA 139 135  --  134*  K 3.9 4.5  --  4.0  CL 111 101  --  100*  CO2 23  --   --  26  GLUCOSE 128* 156*  --  163*  BUN 10 15  --  21*  CREATININE 0.48* 1.00 1.07 1.07  CALCIUM 7.9*  --   --  7.7*    PT/INR:  Recent Labs  06/22/14 1350  LABPROT 16.8*  INR 1.35   ABG    Component Value Date/Time   PHART 7.345* 06/22/2014 1924   HCO3 22.1 06/22/2014 1924   TCO2 22 06/23/2014 1658   ACIDBASEDEF 3.0* 06/22/2014  1924   O2SAT 97.0 06/22/2014 1924   CBG (last 3)   Recent Labs  06/23/14 1916 06/23/14 2336 06/24/14 0334  GLUCAP 162* 154* 149*    Assessment/Plan: S/P Procedure(s) (LRB): CORONARY ARTERY BYPASS GRAFTING (CABG) x five, using left internal mammary artery, and right leg greater saphenous vein harvested endoscopically (N/A) TRANSESOPHAGEAL ECHOCARDIOGRAM (TEE) (N/A) MAZE (N/A) Plan for transfer to step-down: see transfer orders  CV- in SR around 60 with lopressor held- will dc lopressor due to bradycardia- restart if HR increases  On altace for BP  A fib- in SR post Maze. LAA clip in place. [place] for anticoagulation to decrease stroke risk if patient agreeable  RESP- bibasilar atelectasis improved  RENAL- creatinine and lytes OK- PO lasix  ENDO_ CBG in 150 range. Restart glucovance  Continue levemir but decrease dose  Anemia secondary to ABL- mild follow  SCD + enoxaparin for DVT prophylaxis     LOS: 6 days    Melrose Nakayama 06/24/2014

## 2014-06-25 ENCOUNTER — Inpatient Hospital Stay (HOSPITAL_COMMUNITY): Payer: Medicare Other

## 2014-06-25 LAB — CBC
HCT: 29.4 % — ABNORMAL LOW (ref 39.0–52.0)
Hemoglobin: 9.6 g/dL — ABNORMAL LOW (ref 13.0–17.0)
MCH: 29.7 pg (ref 26.0–34.0)
MCHC: 32.7 g/dL (ref 30.0–36.0)
MCV: 91 fL (ref 78.0–100.0)
Platelets: 143 10*3/uL — ABNORMAL LOW (ref 150–400)
RBC: 3.23 MIL/uL — AB (ref 4.22–5.81)
RDW: 13.7 % (ref 11.5–15.5)
WBC: 8.7 10*3/uL (ref 4.0–10.5)

## 2014-06-25 LAB — BASIC METABOLIC PANEL
ANION GAP: 9 (ref 5–15)
BUN: 22 mg/dL — ABNORMAL HIGH (ref 6–20)
CALCIUM: 8.1 mg/dL — AB (ref 8.9–10.3)
CO2: 27 mmol/L (ref 22–32)
Chloride: 101 mmol/L (ref 101–111)
Creatinine, Ser: 1.03 mg/dL (ref 0.61–1.24)
GFR calc Af Amer: >60 mL/min (ref >60)
GFR calc non Af Amer: >60 mL/min (ref >60)
Glucose, Bld: 61 mg/dL — ABNORMAL LOW (ref 65–99)
POTASSIUM: 3.7 mmol/L (ref 3.5–5.1)
Sodium: 137 mmol/L (ref 135–145)

## 2014-06-25 LAB — GLUCOSE, CAPILLARY
GLUCOSE-CAPILLARY: 109 mg/dL — AB (ref 65–99)
GLUCOSE-CAPILLARY: 230 mg/dL — AB (ref 65–99)
GLUCOSE-CAPILLARY: 188 mg/dL — AB (ref 65–99)
Glucose-Capillary: 53 mg/dL — ABNORMAL LOW (ref 65–99)
Glucose-Capillary: 65 mg/dL (ref 65–99)
Glucose-Capillary: 148 mg/dL — ABNORMAL HIGH (ref 65–99)

## 2014-06-25 MED ORDER — METOPROLOL TARTRATE 12.5 MG HALF TABLET
12.5000 mg | ORAL_TABLET | Freq: Two times a day (BID) | ORAL | Status: DC
  Administered 2014-06-25 (×2): 12.5 mg via ORAL
  Filled 2014-06-25 (×4): qty 1

## 2014-06-25 MED ORDER — GLUCOSE 40 % PO GEL
ORAL | Status: AC
  Administered 2014-06-25: 37.5 g
  Filled 2014-06-25: qty 1

## 2014-06-25 MED ORDER — ROSUVASTATIN CALCIUM 10 MG PO TABS
10.0000 mg | ORAL_TABLET | Freq: Every day | ORAL | Status: DC
  Administered 2014-06-25 – 2014-06-27 (×3): 10 mg via ORAL
  Filled 2014-06-25 (×3): qty 1

## 2014-06-25 NOTE — Progress Notes (Addendum)
[address]       [name] [address]             [phone]      3 Days Post-Op Procedure(s) (LRB): CORONARY ARTERY BYPASS GRAFTING (CABG) x five, using left internal mammary artery, and right leg greater saphenous vein harvested endoscopically (N/A) TRANSESOPHAGEAL ECHOCARDIOGRAM (TEE) (N/A) MAZE (N/A) Subjective: Feels pretty well overall  Objective: Vital signs in last 24 hours: Temp:  [97.3 F (36.3 C)-99.5 F (37.5 C)] 99.5 F (37.5 C) (06/23 0436) Pulse Rate:  [57-72] 67 (06/23 0436) Cardiac Rhythm:  [-] Normal sinus rhythm (06/22 2030) Resp:  [14-22] 18 (06/23 0436) BP: (98-118)/(49-76) 101/49 mmHg (06/23 0436) SpO2:  [96 %-100 %] 98 % (06/23 0436) Weight:  [196 lb 3.4 oz (89 kg)] 196 lb 3.4 oz (89 kg) (06/23 0436)  Hemodynamic parameters for last 24 hours:    Intake/Output from previous day: 06/22 0701 - 06/23 0700 In: 1040 [P.O.:960; I.V.:80] Out: 760 [Urine:760] Intake/Output this shift:    General appearance: alert, cooperative and no distress Heart: regular rate and rhythm Lungs: clear to auscultation bilaterally Abdomen: benign Extremities: no edema Wound: incis healing well  Lab Results:  Recent Labs  06/24/14 0512 06/25/14 0428  WBC 8.8 8.7  HGB 9.7* 9.6*  HCT 29.6* 29.4*  PLT 125* 143*   BMET:  Recent Labs  06/24/14 0512 06/25/14 0428  NA 134* 137  K 4.0 3.7  CL 100* 101  CO2 26 27  GLUCOSE 163* 61*  BUN 21* 22*  CREATININE 1.07 1.03  CALCIUM 7.7* 8.1*    PT/INR:  Recent Labs  06/22/14 1350  LABPROT 16.8*  INR 1.35   ABG    Component Value Date/Time   PHART 7.345* 06/22/2014 1924   HCO3 22.1 06/22/2014 1924   TCO2 22 06/23/2014 1658   ACIDBASEDEF 3.0* 06/22/2014 1924   O2SAT 97.0 06/22/2014 1924   CBG (last 3)   Recent Labs  06/25/14 0527 06/25/14 0551 06/25/14 0616  GLUCAP 53* 65 109*    Meds Scheduled Meds: . acetaminophen  1,000 mg Oral 4 times per day   Or  . acetaminophen  (TYLENOL) oral liquid 160 mg/5 mL  1,000 mg Per Tube 4 times per day  . aspirin EC  325 mg Oral Daily   Or  . aspirin  324 mg Per Tube Daily  . Besifloxacin HCl  1 drop Left Eye TID  . bisacodyl  10 mg Oral Daily   Or  . bisacodyl  10 mg Rectal Daily  . clidinium-chlordiazePOXIDE  1 capsule Oral TID AC  . Difluprednate  1 drop Left Eye TID  . docusate sodium  200 mg Oral Daily  . enoxaparin (LOVENOX) injection  40 mg Subcutaneous QHS  . furosemide  40 mg Oral Daily  . glyBURIDE  5 mg Oral BID WC   And  . metFORMIN  500 mg Oral BID WC  . insulin aspart  0-15 Units Subcutaneous TID WC  . insulin aspart  0-5 Units Subcutaneous QHS  . insulin detemir  25 Units Subcutaneous Daily  . nepafenac  1 drop Left Eye Daily  . pantoprazole  40 mg Oral Daily  . potassium chloride  20 mEq Oral BID  . ramipril  5 mg Oral Daily  . rosuvastatin  10 mg Oral Q M,W,F  . sodium chloride  3 mL Intravenous Q12H   Continuous Infusions:  PRN Meds:.sodium chloride, alum & mag hydroxide-simeth, guaiFENesin-dextromethorphan, magnesium  hydroxide, menthol-cetylpyridinium, ondansetron (ZOFRAN) IV, oxyCODONE, sodium chloride, tamsulosin, traMADol, zolpidem  Xrays Dg Chest Port 1 View  06/24/2014   CLINICAL DATA:  Subsequent encounter for coronary artery disease. Status post coronary artery bypass grafting.  EXAM: PORTABLE CHEST - 1 VIEW  COMPARISON:  06/23/2014.  FINDINGS: 0656 hrs. Pulmonary artery catheter is been removed in the interval with right IJ sheath still in place. Thoracic drain seen on the previous study been removed in the interval. The cardio pericardial silhouette is enlarged. No edema or focal airspace consolidation. No substantial pleural effusion. Telemetry leads overlie the chest.  IMPRESSION: Interval removal of pulmonary artery catheter and thoracic drains.   Electronically Signed   By: Misty Stanley M.D.   On: 06/24/2014 07:59    Assessment/Plan: S/P Procedure(s) (LRB): CORONARY ARTERY  BYPASS GRAFTING (CABG) x five, using left internal mammary artery, and right leg greater saphenous vein harvested endoscopically (N/A) TRANSESOPHAGEAL ECHOCARDIOGRAM (TEE) (N/A) MAZE (N/A)  1 doing well 2 some PAC's, add low dose betablocker if he will tolerate 3 d/cinsulin with low sugars 4 labs stable 5 cont pulm toilet/rehab 6 home 1-2 days    LOS: 7 days    GOLD,WAYNE E 06/25/2014  Patient seen and examined, agree with above Will dc HS insulin since he was severely hypoglycemic this AM  Remo Lipps C. Roxan Hockey, MD [facility] [phone]

## 2014-06-25 NOTE — Progress Notes (Signed)
Preceived t.o.r &A from Dr. Roxan Hockey to disconnect external pacer, as pt heart rate has been maintained for a lengthy period of time in the 70's. Pt's external pacer was removed and pt continues to have HR at 74 bpm.  Will continue to monitor.

## 2014-06-25 NOTE — Progress Notes (Addendum)
CARDIAC REHAB PHASE I   PRE:  Rate/Rhythm: 32 SR  BP:  Sitting: 127/64        SaO2: 96 RA  MODE:  Ambulation: 200 ft   POST:  Rate/Rhythm: 82 SR  BP:  Sitting: 139/66       SaO2: 100 RA  Pt up at sink brushing his teeth, states he is getting ready to walk with his wife.  Agreeable to walk with cardiac rehab. Pt states he had a bad night, states his chest was hurting and woke him up every 40 minutes.  Pt describes his chest pain as a 5/10 squeezing to the right of his incision, ongoing but worse with inspiration. Pt ambulated 200 ft on RA, pacer, rolling walker, standby assist, steady gait, tolerated well.  Pt c/o continued incisional pain, denies dizziness, DOE. Encouraged pt to ambulate two more times today, encouraged IS. Pt verbalized understanding. Pt to recliner after walk, feet elevated, SCD placed to L leg, wife at bedside, call bell within reach.  [phone]    Lenna Sciara, RN, BSN 06/25/2014 10:50 AM

## 2014-06-25 NOTE — Discharge Summary (Signed)
Physician Discharge Summary       [address]       [place] [address]             [phone]    Patient ID: VU LIEBMAN MRN: [id number] DOB/AGE: 02-16-1945 69 y.o.  Admit date: 06/17/2014 Discharge date: 06/27/2014  Admission Diagnoses: 1. NSTEMI 2. History of CAD (s/p PCI with stent to RCA 16') 3. History of hypertension 4. History of DM type 2 (diabetes mellitus, type 2) 5. History of hyperlipidemia 6. History of cardiomyopathy, ischemic 7. History of PAF 8. History of IBS  Discharge Diagnoses:  1. NSTEMI 2. History of CAD (s/p PCI with stent to RCA 16') 3. History of hypertension 4. History of DM type 2 (diabetes mellitus, type 2) 5. History of hyperlipidemia 6. History of cardiomyopathy, ischemic 7. History of PAF 8. History of IBS 9. ABL anemia 10. Mild thrombocytopenia  Procedure (s):  Cardiac catheterization done by Dr. Martinique on 06/18/2014:  Prox LAD lesion, 70% stenosed.  1st Diag lesion, 70% stenosed.  Ramus lesion, 60% stenosed.  Prox Cx to Mid Cx lesion, 60% stenosed.  There is severe left ventricular systolic dysfunction.  Mid LAD lesion, 75% stenosed.  1. Severe 3 vessel obstructive CAD  - there is early restenosis of the RCA at site of prior stent.   - there is significant disease involving the proximal to mid LAD. FFR is abnormal at 0.74 indicating that this is hemodynamically significant. 2. Severe LV dysfunction  Recommendation: Given early restenosis in RCA stent and significant disease in a large LAD distribution with severe LV dysfunction I would recommend CABG for complete revascularization. Discussed with Dr. Burt Knack who reviewed films with me. Will stop Brilinta. Resume IV heparin. Given length of stent in RCA I would recommend starting IV tirofiban in am until the time of CABG.   Median sternotomy, extracorporeal circulation, coronary artery bypass grafting x5 (left internal mammary artery to left anterior  descending, saphenous vein graft sequentially to diagonal 1 and ramus intermedius, sequential saphenous vein graft to posterior descending and posterolateral), endoscopic vein harvest right leg, left-sided maze procedure, left atrial appendage clip by Dr. Roxan Hockey on 06/22/2014.   History of Presenting Illness: This is a 69 yo male with a history of type II diabetes, hypertension, hyperlipidemia, atrial fibrillation, and CAD. He had PCI with stenting of his RCA a month ago. He did well until the evening of 06/17/2014 when he developed severe dull right sided CP. He came to [facility] ED and was admitted. He ruled in for NSTEMI.  He had cardiac catheterization on 06/18/2014 where he was found to have 3 vessel CAD with an EF of 35%.  He is currently pain free.  He was taking Brillinta prior to admission. A cardiothoracic consultation was obtained with Dr. Roxan Hockey for the consideration of coronary artery bypass grafting surgery. Dr. Roxan Hockey discussed the general nature of the procedure, the need for general anesthesia, the use of cardiopulmonary bypass, and the incisions to be used with Mr and Mrs Ricciuti. I discussed the expected hospital stay, overall recovery and short and long term outcomes. They understand the risks include, but are not limited to death, stroke, MI, DVT/PE, bleeding, possible need for transfusion, infections, cardiac arrhythmias, and other organ system dysfunction including respiratory, renal, or GI complications.   Patient accepts the risks and agrees to proceed. Pre operative carotid duplex showed no significant internal carotid artery stenosis bilaterally. CABG was planned for Monday 06/22/14 to allow  for Brillinta wash out.   Brief Hospital Course:  The patient was extubated the evening of surgery without difficulty. He was initially AAI paced. He had a run of NSVT the evening of surgery. He remained in sinus rhythm thereafter. He remained afebrile and hemodynamically  stable. Gordy Councilman, a line, chest tubes, and foley were removed early in the post operative course. Lopressor was started and titrated accordingly. He was volume over loaded and diuresed. He had ABL anemia. He did not require a post op transfusion. His last H and H was 9.6 and 29.4. He also had mild thrombocytopenia. His last platelets were 143,000. He was weaned off the insulin drip. The patient's glucose remained well controlled. The patient's HGA1C pre op was 8.9. Once he was tolerating a diet, Glucovance was restarted.  The patient was felt surgically stable for transfer from the ICU to PCTU for further convalescence on 06/24/2014. He continues to progress with cardiac rehab. He was ambulating on room air. He has been tolerating a diet and has had a bowel movement. Epicardial pacing wires and chest tube sutures will be removed prior to discharge. The patient is felt surgically stable for discharge today.   Latest Vital Signs: Blood pressure 117/67, pulse 62, temperature 97.7 F (36.5 C), temperature source Oral, resp. rate 18, height 5' 8"  (1.727 m), weight 198 lb 3.1 oz (89.9 kg), SpO2 97 %.  Physical Exam: General appearance: alert, cooperative and no distress Heart: regular rate and rhythm Lungs: clear to auscultation bilaterally Abdomen: benign Extremities: no edema Wound: incis healing well  Discharge Condition:Stable and discharged to home.  Recent laboratory studies:  Lab Results  Component Value Date   WBC 8.7 06/25/2014   HGB 9.6* 06/25/2014   HCT 29.4* 06/25/2014   MCV 91.0 06/25/2014   PLT 143* 06/25/2014   Lab Results  Component Value Date   NA 137 06/25/2014   K 3.7 06/25/2014   CL 101 06/25/2014   CO2 27 06/25/2014   CREATININE 1.03 06/25/2014   GLUCOSE 61* 06/25/2014    Diagnostic Studies: Dg Chest 2 View  06/25/2014   CLINICAL DATA:  Four days postop from CABG. Cough and shortness of breath.  EXAM: CHEST  2 VIEW  COMPARISON:  06/24/2014  FINDINGS: Persistent  small bilateral pleural effusions are seen as well as atelectasis in the left lower lobe. Mild cardiomegaly stable. No evidence of congestive heart failure. No pneumothorax visualized.  IMPRESSION: Small bilateral pleural effusions and left lower lobe atelectasis.   Electronically Signed   By: Earle Gell M.D.   On: 06/25/2014 08:16    Discharge Medications:    Medication List    STOP taking these medications        metoprolol succinate 50 MG 24 hr tablet  Commonly known as:  TOPROL-XL     nitroGLYCERIN 0.4 MG SL tablet  Commonly known as:  NITROSTAT     ticagrelor 90 MG Tabs tablet  Commonly known as:  BRILINTA      TAKE these medications        AQUIFY MULTI-PURPOSE Soln  Place 1 drop into both eyes daily as needed (dry eyes).     aspirin 81 MG tablet  Take 81 mg by mouth daily.     BESIVANCE OP  Place 1 drop into the left eye 3 (three) times daily. Post cataract surgery     cephALEXin 500 MG capsule  Commonly known as:  KEFLEX  Take 1 capsule (500 mg total) by mouth 4 (  four) times daily. 10 days     clidinium-chlordiazePOXIDE 5-2.5 MG per capsule  Commonly known as:  LIBRAX  take 1 capsule by mouth three times a day before meals     dabigatran 150 MG Caps capsule  Commonly known as:  PRADAXA  Take 1 capsule (150 mg total) by mouth every 12 (twelve) hours.     diphenoxylate-atropine 2.5-0.025 MG per tablet  Commonly known as:  LOMOTIL  take 1 tablet by mouth four times a day if needed     DUREZOL OP  Place 1 drop into the left eye 4 (four) times daily as needed (post cataract surgery).     fluticasone 50 MCG/ACT nasal spray  Commonly known as:  FLONASE  Place 1 spray into both nostrils as needed.     furosemide 40 MG tablet  Commonly known as:  LASIX  Take 1 tablet (40 mg total) by mouth daily. For 7 Days     glyBURIDE-metformin 5-500 MG per tablet  Commonly known as:  GLUCOVANCE  Take 1 tablet by mouth daily with breakfast.     ILEVRO OP  Place 1 drop  into the left eye daily. POST CATARACT LENSE SURGERY     loteprednol 0.5 % ophthalmic suspension  Commonly known as:  LOTEMAX  Place 1 drop into both eyes daily as needed (dry eyes).     metoprolol tartrate 25 MG tablet  Commonly known as:  LOPRESSOR  Take 1 tablet (25 mg total) by mouth 2 (two) times daily.     oxyCODONE 5 MG immediate release tablet  Commonly known as:  Oxy IR/ROXICODONE  Take 1-2 tablets (5-10 mg total) by mouth every 3 (three) hours as needed for severe pain.     potassium chloride SA 20 MEQ tablet  Commonly known as:  K-DUR,KLOR-CON  Take 1 tablet (20 mEq total) by mouth daily. For 7 Days     ramipril 5 MG capsule  Commonly known as:  ALTACE  TAKE 1 CAPSULE DAILY.     rosuvastatin 10 MG tablet  Commonly known as:  CRESTOR  Take 1 tablet (10 mg total) by mouth every Monday, Wednesday, and Friday.     tamsulosin 0.4 MG Caps capsule  Commonly known as:  FLOMAX  Take 1 capsule (0.4 mg total) by mouth daily.         The patient has been discharged on:   1.Beta Blocker:  Yes [ x  ]                              No   [   ]                              If No, reason:  2.Ace Inhibitor/ARB: Yes [ x  ]                                     No  [    ]                                     If No, reason:  3.Statin:   Yes [ x  ]  No  [   ]                  If No, reason:  4.Ecasa:  Yes  [  x ]                  No   [   ]                  If No, reason:  Follow Up Appointments: Follow-up Information    Follow up with Dola Argyle, MD On 07/21/2014.   Specialty:  Cardiology   Why:  Appointment time is at 8:15 am   Contact information:   [address] [address] [phone]       Follow up with Melrose Nakayama, MD On 07/28/2014.   Specialty:  Cardiothoracic Surgery   Why:  PA/LAT CXR to be taken (at [facility] which is in the same building as Dr. Leonarda Salon office) on 07/28/2014 at 12:45  pm;Appointment time is at 1:30 pm   Contact information:   [place] [address] [phone]       Follow up with DOOLITTLE, Linton Ham, MD.   Specialties:  Internal Medicine, Adolescent Medicine   Why:  Call for a follow up appointment for further surveillance of HGA1C 8.9 and diabetes management   Contact information:   [place] [address] [phone]       Signed: Cinda Quest 06/27/2014, 9:48 AM

## 2014-06-25 NOTE — Progress Notes (Signed)
SUBJECTIVE:  Denies chest pain or SOB  OBJECTIVE:   Vitals:   Filed Vitals:   06/24/14 1200 06/24/14 1247 06/24/14 2103 06/25/14 0436  BP: 99/71 118/53 98/57 101/49  Pulse: 64 62 72 67  Temp: 97.8 F (36.6 C) 98 F (36.7 C) 99.3 F (37.4 C) 99.5 F (37.5 C)  TempSrc: Oral Oral Oral Oral  Resp: 18  18 18   Height:      Weight:    196 lb 3.4 oz (89 kg)  SpO2: 98% 98% 100% 98%   I&O's:   Intake/Output Summary (Last 24 hours) at 06/25/14 0837 Last data filed at 06/25/14 0600  Gross per 24 hour  Intake   1020 ml  Output    710 ml  Net    310 ml   TELEMETRY: Reviewed telemetry pt in NSR:     PHYSICAL EXAM General: Well developed, well nourished, in no acute distress Head: Eyes PERRLA, No xanthomas.   Normal cephalic and atramatic  Lungs:   Clear bilaterally to auscultation and percussion. Heart:   HRRR S1 S2 Pulses are 2+ & equal. Abdomen: Bowel sounds are positive, abdomen soft and non-tender without masses  Extremities:   No clubbing, cyanosis or edema.  DP +1 Neuro: Alert and oriented X 3. Psych:  Good affect, responds appropriately   LABS: Basic Metabolic Panel:  Recent Labs  06/23/14 0400  06/23/14 1700 06/24/14 0512 06/25/14 0428  NA 139  < >  --  134* 137  K 3.9  < >  --  4.0 3.7  CL 111  < >  --  100* 101  CO2 23  --   --  26 27  GLUCOSE 128*  < >  --  163* 61*  BUN 10  < >  --  21* 22*  CREATININE 0.48*  < > 1.07 1.07 1.03  CALCIUM 7.9*  --   --  7.7* 8.1*  MG 2.5*  --  2.3  --   --   < > = values in this interval not displayed. Liver Function Tests: No results for input(s): AST, ALT, ALKPHOS, BILITOT, PROT, ALBUMIN in the last 72 hours. No results for input(s): LIPASE, AMYLASE in the last 72 hours. CBC:  Recent Labs  06/24/14 0512 06/25/14 0428  WBC 8.8 8.7  HGB 9.7* 9.6*  HCT 29.6* 29.4*  MCV 91.9 91.0  PLT 125* 143*   Cardiac Enzymes: No results for input(s): CKTOTAL, CKMB, CKMBINDEX, TROPONINI in the last 72 hours. BNP: Invalid  input(s): POCBNP D-Dimer: No results for input(s): DDIMER in the last 72 hours. Hemoglobin A1C: No results for input(s): HGBA1C in the last 72 hours. Fasting Lipid Panel: No results for input(s): CHOL, HDL, LDLCALC, TRIG, CHOLHDL, LDLDIRECT in the last 72 hours. Thyroid Function Tests: No results for input(s): TSH, T4TOTAL, T3FREE, THYROIDAB in the last 72 hours.  Invalid input(s): FREET3 Anemia Panel: No results for input(s): VITAMINB12, FOLATE, FERRITIN, TIBC, IRON, RETICCTPCT in the last 72 hours. Coag Panel:   Lab Results  Component Value Date   INR 1.35 06/22/2014   INR 1.13 06/18/2014   INR 1.18 06/18/2014    RADIOLOGY: Dg Chest 2 View  06/25/2014   CLINICAL DATA:  Four days postop from CABG. Cough and shortness of breath.  EXAM: CHEST  2 VIEW  COMPARISON:  06/24/2014  FINDINGS: Persistent small bilateral pleural effusions are seen as well as atelectasis in the left lower lobe. Mild cardiomegaly stable. No evidence of congestive heart failure. No  pneumothorax visualized.  IMPRESSION: Small bilateral pleural effusions and left lower lobe atelectasis.   Electronically Signed   By: Earle Gell M.D.   On: 06/25/2014 08:16   Dg Chest 2 View  06/17/2014   CLINICAL DATA:  Acute onset of central chest pain. Initial encounter.  EXAM: CHEST  2 VIEW  COMPARISON:  Chest radiograph performed 11/22/2007  FINDINGS: The lungs are well-aerated and clear. There is no evidence of focal opacification, pleural effusion or pneumothorax.  The heart is borderline normal in size. No acute osseous abnormalities are seen.  IMPRESSION: No acute cardiopulmonary process seen.   Electronically Signed   By: Garald Balding M.D.   On: 06/17/2014 20:26   Dg Chest Port 1 View  06/24/2014   CLINICAL DATA:  Subsequent encounter for coronary artery disease. Status post coronary artery bypass grafting.  EXAM: PORTABLE CHEST - 1 VIEW  COMPARISON:  06/23/2014.  FINDINGS: 0656 hrs. Pulmonary artery catheter is been removed  in the interval with right IJ sheath still in place. Thoracic drain seen on the previous study been removed in the interval. The cardio pericardial silhouette is enlarged. No edema or focal airspace consolidation. No substantial pleural effusion. Telemetry leads overlie the chest.  IMPRESSION: Interval removal of pulmonary artery catheter and thoracic drains.   Electronically Signed   By: Misty Stanley M.D.   On: 06/24/2014 07:59   Dg Chest Port 1 View  06/23/2014   CLINICAL DATA:  Coronary artery disease ; status post coronary artery bypass grafting  EXAM: PORTABLE CHEST - 1 VIEW  COMPARISON:  June 22, 2014  FINDINGS: Endotracheal tube and nasogastric tube have been removed. Mediastinal drain and left chest tube remain. Swan-Ganz catheter tip is in the main pulmonary outflow tract. No pneumothorax. There is patchy consolidation in the left lower lobe. The lungs are otherwise clear. The heart is borderline enlarged with pulmonary vascular within normal limits. No adenopathy.  IMPRESSION: Tube and catheter positions as described without pneumothorax. Patchy consolidation left base. Lungs otherwise clear. No change in cardiac silhouette.   Electronically Signed   By: Lowella Grip III M.D.   On: 06/23/2014 07:57   Dg Chest Port 1 View  06/22/2014   CLINICAL DATA:  Postop CABG  EXAM: PORTABLE CHEST - 1 VIEW  COMPARISON:  06/17/2014  FINDINGS: There is a new endotracheal tube with tip just below the clavicular heads. An orogastric tube enters the stomach.  Right IJ approach Swan-Ganz catheter with tip near the main pulmonary artery. Thoracic drains noted. There are new changes of CABG and left atrial exclusion.  Stable heart size and mediastinal contours for technique. Low lung volumes with mild retrocardiac atelectasis. There is mild pleural thickening at the left apex, less than the interspace, which could be from prominent extrapleural flat in the setting of low volumes, or postoperative hematoma.   IMPRESSION: 1. Unremarkable positioning of tubes and central line. 2. New mild extrapleural thickening at the left apex which could be from lower volumes or small hematoma. 3. Mild retrocardiac atelectasis.   Electronically Signed   By: Monte Fantasia M.D.   On: 06/22/2014 14:54   Assessment/Plan:   1. NSTEMI (non-ST elevated myocardial infarction) 2. ASCAD s/p HSRA/PCI-stent to RCA x 2 1 month ago with recurrent angina and NSTEMI now with cath showing early restenosis of RCA stent and significant LAD disease by FFR and severe LV dysfunction s/p CABG POD #3. Continue ASA/statin/BB 3. DM type 2 (diabetes mellitus, type 2)  4.  ATRIAL FIBRILLATION, PAROXYSMAL- maintaining a sinus bradycardia. Had been on Flecainide in the past but stopped with diagnosis of CAD. This patients CHA2DS2-VASc Score and unadjusted Ischemic Stroke Rate (% per year) is equal to 4 % stroke rate/year from a score of 4. Above score calculated as 1 point each if present [CHF, HTN, DM, Vascular=MI/PAD/Aortic Plaque, Age if 65-74, or Male]. Above score calculated as 2 points each if present [Age > 75, or Stroke/TIA/TE]. Patient has refused anticoagulation in the past but now would recommend reconsideration. Would be a candidate for a NOAC agent.  I have discussed this with him and he wants to wait and talk with Dr. Ron Parker at next [place]. 5.   Hypertension - controlled but soft 6.   Nonsustained ventricular tachycardia -  Last EF 40-45%. Continue low dose BB 7.   Hyperlipidemia - LDL still not at goal - increase crestor to daily and will need FLP and ALT in 6 weeks  8.   Ischemic DCM with EF 40-45% - continue BB and ACE I as BP tolerates    [name] Margarita, MD  06/25/2014  8:37 AM

## 2014-06-25 NOTE — Progress Notes (Signed)
Repeat accu ck 109.

## 2014-06-25 NOTE — Progress Notes (Signed)
Inpatient Diabetes Program Recommendations  AACE/ADA: New Consensus Statement on Inpatient Glycemic Control (2013)  Target Ranges:  Prepandial:   less than 140 mg/dL      Peak postprandial:   less than 180 mg/dL (1-2 hours)      Critically ill patients:  140 - 180 mg/dL   Results for [name], [name] (MRN [id number]) as of 06/25/2014 09:43  Ref. Range 06/24/2014 03:34 06/24/2014 08:04 06/24/2014 12:06 06/24/2014 17:46 06/24/2014 20:40 06/25/2014 05:27 06/25/2014 05:51 06/25/2014 06:16  Glucose-Capillary Latest Ref Range: 65-99 mg/dL 149 (H) 136 (H) 145 (H) 134 (H) 121 (H) 53 (L) 65 109 (H)   Current orders for Inpatient glycemic control: Novolog 0-15 units TID with meals, Novolog 0-5 units HS, Glyburide 5 mg BID, Metformin 500 mg BID  Inpatient Diabetes Program Recommendations Insulin - Basal: Noted glucose 53 mg/dl this morning at 5:27am and Levemir 25 units QHS was discontinued. Please consider reordering Levemir at a lower dose; recommend ordering Levemir 16 units QHS.  Thanks, Barnie Alderman, RN, MSN, CCRN, CDE Diabetes Coordinator Inpatient Diabetes Program [phone] (Team Pager from [place] to [place]) [phone] ([facility] office) [phone] [facility] [place] office) [phone] [facility] office)

## 2014-06-25 NOTE — Progress Notes (Signed)
Was checking lab. Results and notice blood glucose was 61 Patient alert and orin. skin cool and dry no symptoms. Did accu ck and got 65. R.N> aware  And gave patient glucose gel and O.J. And graham crackers . Will repeat accu shortly.

## 2014-06-25 NOTE — Progress Notes (Addendum)
Repeat accu ck 65 at 05:51

## 2014-06-25 NOTE — Progress Notes (Signed)
UR Completed. Nasri Boakye, RN, BSN.  [phone] 

## 2014-06-26 LAB — GLUCOSE, CAPILLARY
Glucose-Capillary: 180 mg/dL — ABNORMAL HIGH (ref 65–99)
Glucose-Capillary: 175 mg/dL — ABNORMAL HIGH (ref 65–99)
Glucose-Capillary: 135 mg/dL — ABNORMAL HIGH (ref 65–99)
Glucose-Capillary: 110 mg/dL — ABNORMAL HIGH (ref 65–99)

## 2014-06-26 MED ORDER — METOPROLOL TARTRATE 25 MG PO TABS
25.0000 mg | ORAL_TABLET | Freq: Two times a day (BID) | ORAL | Status: DC
  Administered 2014-06-26 – 2014-06-27 (×3): 25 mg via ORAL
  Filled 2014-06-26 (×4): qty 1

## 2014-06-26 MED ORDER — ASPIRIN 81 MG PO CHEW
81.0000 mg | CHEWABLE_TABLET | Freq: Every day | ORAL | Status: DC
  Administered 2014-06-27: 81 mg
  Filled 2014-06-26: qty 1

## 2014-06-26 MED ORDER — DABIGATRAN ETEXILATE MESYLATE 150 MG PO CAPS
150.0000 mg | ORAL_CAPSULE | Freq: Two times a day (BID) | ORAL | Status: DC
  Administered 2014-06-27: 150 mg via ORAL
  Filled 2014-06-26 (×2): qty 1

## 2014-06-26 MED ORDER — ASPIRIN EC 81 MG PO TBEC
81.0000 mg | DELAYED_RELEASE_TABLET | Freq: Every day | ORAL | Status: DC
  Filled 2014-06-26: qty 1

## 2014-06-26 MED ORDER — METFORMIN HCL 500 MG PO TABS
1000.0000 mg | ORAL_TABLET | Freq: Two times a day (BID) | ORAL | Status: DC
  Administered 2014-06-26: 1000 mg via ORAL
  Filled 2014-06-26 (×4): qty 2

## 2014-06-26 MED ORDER — GLYBURIDE 5 MG PO TABS
5.0000 mg | ORAL_TABLET | Freq: Two times a day (BID) | ORAL | Status: DC
  Administered 2014-06-26: 5 mg via ORAL
  Filled 2014-06-26 (×4): qty 1

## 2014-06-26 NOTE — Care Management Note (Signed)
Case Management Note  Patient Details  Name: [name] MRN: [id number] Date of Birth: [birth year]  Subjective/Objective:    Pt admitted with NSTEMI          Action/Plan:  Pt is from home independent with wife .  CM will continue to monitor for disposition needs.   Expected Discharge Date:                  Expected Discharge Plan:  [place]  In-House Referral:     Discharge planning Services  CM Consult  Post Acute Care Choice:    Choice offered to:     DME Arranged:    DME Agency:     HH Arranged:    [place] Agency:     Status of Service:  In process, will continue to follow  Medicare Important Message Given:  Yes Date Medicare IM Given:  06/25/14 Medicare IM give by:  Elenor Quinones Date Additional Medicare IM Given:  06/26/14 Additional Medicare Important Message give by:  Elenor Quinones  If discussed at Long Length of Stay Meetings, dates discussed:    Additional Comments:  Maryclare Labrador, RN 06/26/2014, 11:48 AM

## 2014-06-26 NOTE — Progress Notes (Addendum)
[address]       [place] [address]             [phone]      4 Days Post-Op Procedure(s) (LRB): CORONARY ARTERY BYPASS GRAFTING (CABG) x five, using left internal mammary artery, and right leg greater saphenous vein harvested endoscopically (N/A) TRANSESOPHAGEAL ECHOCARDIOGRAM (TEE) (N/A) MAZE (N/A) Subjective: Feels pretty good, some brief PAF, felt the fluttering  Objective: Vital signs in last 24 hours: Temp:  [97.6 F (36.4 C)-98.4 F (36.9 C)] 98.4 F (36.9 C) (06/24 0355) Pulse Rate:  [66-69] 69 (06/24 0355) Cardiac Rhythm:  [-] Normal sinus rhythm (06/23 2000) Resp:  [18] 18 (06/24 0355) BP: (103-114)/(60-61) 114/61 mmHg (06/24 0355) SpO2:  [95 %-99 %] 95 % (06/24 0355) Weight:  [198 lb 3.2 oz (89.903 kg)] 198 lb 3.2 oz (89.903 kg) (06/24 5993)  Hemodynamic parameters for last 24 hours:    Intake/Output from previous day: 06/23 0701 - 06/24 0700 In: 1080 [P.O.:1080] Out: 1750 [Urine:1750] Intake/Output this shift:    General appearance: alert, cooperative and no distress Heart: regular rate and rhythm Lungs: dim in bases Abdomen: benign Extremities: no sig edema Wound: incis healing well  Lab Results:  Recent Labs  06/24/14 0512 06/25/14 0428  WBC 8.8 8.7  HGB 9.7* 9.6*  HCT 29.6* 29.4*  PLT 125* 143*   BMET:  Recent Labs  06/24/14 0512 06/25/14 0428  NA 134* 137  K 4.0 3.7  CL 100* 101  CO2 26 27  GLUCOSE 163* 61*  BUN 21* 22*  CREATININE 1.07 1.03  CALCIUM 7.7* 8.1*    PT/INR: No results for input(s): LABPROT, INR in the last 72 hours. ABG    Component Value Date/Time   PHART 7.345* 06/22/2014 1924   HCO3 22.1 06/22/2014 1924   TCO2 22 06/23/2014 1658   ACIDBASEDEF 3.0* 06/22/2014 1924   O2SAT 97.0 06/22/2014 1924   CBG (last 3)   Recent Labs  06/25/14 1624 06/25/14 2047 06/26/14 0612  GLUCAP 188* 148* 180*    Meds Scheduled Meds: . acetaminophen  1,000 mg Oral 4 times per day   Or  .  acetaminophen (TYLENOL) oral liquid 160 mg/5 mL  1,000 mg Per Tube 4 times per day  . aspirin EC  325 mg Oral Daily   Or  . aspirin  324 mg Per Tube Daily  . Besifloxacin HCl  1 drop Left Eye TID  . bisacodyl  10 mg Oral Daily   Or  . bisacodyl  10 mg Rectal Daily  . clidinium-chlordiazePOXIDE  1 capsule Oral TID AC  . Difluprednate  1 drop Left Eye TID  . docusate sodium  200 mg Oral Daily  . enoxaparin (LOVENOX) injection  40 mg Subcutaneous QHS  . furosemide  40 mg Oral Daily  . glyBURIDE  5 mg Oral BID WC   And  . metFORMIN  500 mg Oral BID WC  . insulin aspart  0-15 Units Subcutaneous TID WC  . metoprolol tartrate  12.5 mg Oral BID  . nepafenac  1 drop Left Eye Daily  . pantoprazole  40 mg Oral Daily  . potassium chloride  20 mEq Oral BID  . ramipril  5 mg Oral Daily  . rosuvastatin  10 mg Oral Daily  . sodium chloride  3 mL Intravenous Q12H   Continuous Infusions:  PRN Meds:.sodium chloride, alum & mag hydroxide-simeth, guaiFENesin-dextromethorphan, magnesium hydroxide, menthol-cetylpyridinium, ondansetron (ZOFRAN) IV, oxyCODONE, sodium chloride, tamsulosin,  traMADol, zolpidem  Xrays Dg Chest 2 View  06/25/2014   CLINICAL DATA:  Four days postop from CABG. Cough and shortness of breath.  EXAM: CHEST  2 VIEW  COMPARISON:  06/24/2014  FINDINGS: Persistent small bilateral pleural effusions are seen as well as atelectasis in the left lower lobe. Mild cardiomegaly stable. No evidence of congestive heart failure. No pneumothorax visualized.  IMPRESSION: Small bilateral pleural effusions and left lower lobe atelectasis.   Electronically Signed   By: Earle Gell M.D.   On: 06/25/2014 08:16    Assessment/Plan: S/P Procedure(s) (LRB): CORONARY ARTERY BYPASS GRAFTING (CABG) x five, using left internal mammary artery, and right leg greater saphenous vein harvested endoscopically (N/A) TRANSESOPHAGEAL ECHOCARDIOGRAM (TEE) (N/A) MAZE (N/A)  1 some brief PAF, not sure there is much room  to increase betablocker as HR can be slow at times- will try 25 bid on the metoprolol. . Will have to discuss some type of AC rx/anti-dysrhythmic if conts. . Cardiology saw yesterday and are monitoring closely.  2 He would rather not use insulin at home if possible, will increase metformin but A1C is relatively poorly controlled. He says he does work closely with Dr Newman Pies (PMD) and at home sugars 130-180's . He says he gets symptomatic with sugars much lower than that.      LOS: 8 days    GOLD,WAYNE E 06/26/2014  Patient seen and examined, agree with above He is willing to take blood thinner Will start Pradaxa tomorrow after pacing wires dc'ed  Remo Lipps C. Roxan Hockey, MD [facility] [phone]

## 2014-06-26 NOTE — Progress Notes (Signed)
CARDIAC REHAB PHASE I   PRE:  Rate/Rhythm: 19 SR with PACs    BP: sitting 100/60    SaO2: 98 RA  MODE:  Ambulation: 490 ft   POST:  Rate/Rhythm: 76 SR with PACs    BP: sitting 114/61     SaO2: 97 RA (slow to register)  Pt c/o being diaphoretic since surgery. Sweating with walk. Tired and SOB. Occasional PACS, no runs. Rest x2. Pt sts he is more fatigued this pm than in am. To BR then bed. Discussed with pt and family ed. Already referred to [facility] in [place]. To watch video later. Voiced understanding. Will walk again later. Encouraged nap now. [phone]   [name] [place] CES, ACSM 06/26/2014 2:18 PM

## 2014-06-27 LAB — GLUCOSE, CAPILLARY
GLUCOSE-CAPILLARY: 42 mg/dL — AB (ref 65–99)
GLUCOSE-CAPILLARY: 82 mg/dL (ref 65–99)
Glucose-Capillary: 46 mg/dL — ABNORMAL LOW (ref 65–99)
Glucose-Capillary: 66 mg/dL (ref 65–99)
Glucose-Capillary: 194 mg/dL — ABNORMAL HIGH (ref 65–99)

## 2014-06-27 MED ORDER — OXYCODONE HCL 5 MG PO TABS: 5.0000 mg | ORAL_TABLET | ORAL | Status: DC | PRN

## 2014-06-27 MED ORDER — METFORMIN HCL 850 MG PO TABS
850.0000 mg | ORAL_TABLET | Freq: Two times a day (BID) | ORAL | Status: DC
  Filled 2014-06-27 (×2): qty 1

## 2014-06-27 MED ORDER — GLYBURIDE 5 MG PO TABS
5.0000 mg | ORAL_TABLET | Freq: Two times a day (BID) | ORAL | Status: DC
  Filled 2014-06-27 (×2): qty 1

## 2014-06-27 MED ORDER — METOPROLOL TARTRATE 25 MG PO TABS: 25.0000 mg | ORAL_TABLET | Freq: Two times a day (BID) | ORAL | Status: DC

## 2014-06-27 MED ORDER — CEPHALEXIN 500 MG PO CAPS: 500.0000 mg | ORAL_CAPSULE | Freq: Four times a day (QID) | ORAL | Status: DC

## 2014-06-27 MED ORDER — DABIGATRAN ETEXILATE MESYLATE 150 MG PO CAPS: 150.0000 mg | ORAL_CAPSULE | Freq: Two times a day (BID) | ORAL | Status: DC

## 2014-06-27 MED ORDER — POTASSIUM CHLORIDE CRYS ER 20 MEQ PO TBCR: 20.0000 meq | EXTENDED_RELEASE_TABLET | Freq: Every day | ORAL | Status: DC

## 2014-06-27 MED ORDER — DEXTROSE 50 % IV SOLN
INTRAVENOUS | Status: AC
  Administered 2014-06-27: 50 mL
  Filled 2014-06-27: qty 50

## 2014-06-27 MED ORDER — FUROSEMIDE 40 MG PO TABS: 40.0000 mg | ORAL_TABLET | Freq: Every day | ORAL | Status: DC

## 2014-06-27 NOTE — Progress Notes (Signed)
CT sutures removed per order. Tolerated well. Pt ready for discharge. Reviewed instructions re: wound care and activity, appointments, and medications with patient and wife. Medication handouts given and reviewed. All questions answered. IV d/c'd, tele off CCMD notified. Patient taken out via w/c with all belongings.

## 2014-06-27 NOTE — Progress Notes (Addendum)
      [address]       [place], [address]             [phone]      5 Days Post-Op Procedure(s) (LRB): CORONARY ARTERY BYPASS GRAFTING (CABG) x five, using left internal mammary artery, and right leg greater saphenous vein harvested endoscopically (N/A) TRANSESOPHAGEAL ECHOCARDIOGRAM (TEE) (N/A) MAZE (N/A)   Subjective:  [name] has no complaints this morning.  He is hoping to go home today. He did have some hypoglycemia overnight  Objective: Vital signs in last 24 hours: Temp:  [97.6 F (36.4 C)-98.3 F (36.8 C)] 97.7 F (36.5 C) (06/25 0444) Pulse Rate:  [60-66] 62 (06/25 0444) Cardiac Rhythm:  [-] Normal sinus rhythm (06/24 2030) Resp:  [18-20] 18 (06/25 0444) BP: (114-128)/(61-77) 117/67 mmHg (06/25 0444) SpO2:  [97 %-100 %] 97 % (06/25 0444) Weight:  [198 lb 3.1 oz (89.9 kg)] 198 lb 3.1 oz (89.9 kg) (06/25 0444)  Intake/Output from previous day: 06/24 0701 - 06/25 0700 In: 540 [P.O.:540] Out: 650 [Urine:650]  General appearance: alert, cooperative and no distress Heart: regular rate and rhythm Lungs: clear to auscultation bilaterally Abdomen: soft, non-tender; bowel sounds normal; no masses,  no organomegaly Extremities: edema trace Wound: sternotomy with blood tinged drainage, minimal erythema, EVH site clean and dry  Lab Results:  Recent Labs  06/25/14 0428  WBC 8.7  HGB 9.6*  HCT 29.4*  PLT 143*   BMET:  Recent Labs  06/25/14 0428  NA 137  K 3.7  CL 101  CO2 27  GLUCOSE 61*  BUN 22*  CREATININE 1.03  CALCIUM 8.1*    PT/INR: No results for input(s): LABPROT, INR in the last 72 hours. ABG    Component Value Date/Time   PHART 7.345* 06/22/2014 1924   HCO3 22.1 06/22/2014 1924   TCO2 22 06/23/2014 1658   ACIDBASEDEF 3.0* 06/22/2014 1924   O2SAT 97.0 06/22/2014 1924   CBG (last 3)   Recent Labs  06/27/14 0615 06/27/14 0636 06/27/14 0655  GLUCAP 42* 66 82    Assessment/Plan: S/P Procedure(s)  (LRB): CORONARY ARTERY BYPASS GRAFTING (CABG) x five, using left internal mammary artery, and right leg greater saphenous vein harvested endoscopically (N/A) TRANSESOPHAGEAL ECHOCARDIOGRAM (TEE) (N/A) MAZE (N/A)  1. CV- maintaining NSR, continue Lopressor, Altace- will add Pradaxa 2. Pulm- no acute issues, off oxygen, continue IS 3. Reanl- creatinine has been WNL, remains hypervolemic continue Lasix 4. DM- sugars have been low with increase in metformin, also patient does not want an increase in his Metformin because it makes his IBS flare with worsening diarrhea. 5. Dispo- patient stable, was told he could be discharged today, EPW removed by me this morning, will plan to d/c this  Afternoon if okay with Dr. Roxan Hockey   LOS: 9 days    [name] 06/27/2014  Patient seen and examined, agree with above He has minimal drainage form the inferior aspect of his sternal wound- c/w fat necrosis, not purulent Will start empiric Keflex until drainage stops Home later today on ASA + pradaxa (for atrial fib). No need to restart [name] C. Roxan Hockey, MD [facility] [phone]

## 2014-06-27 NOTE — Progress Notes (Signed)
CARDIAC REHAB PHASE I   PRE:  Rate/Rhythm: 75 sinus  BP:  Supine: 143/71  Sitting:   Standing:      MODE:  Ambulation: 550 ft   POST:  Rate/Rhythem: 87  BP:  Supine:   Sitting: 158/74 manual check  Standing:     Pt ambulated 550 ft with assist x1.  Pt needed reminding not to use arms when getting up and down.  Tolerated walk well, only complaint was chest soreness with coughing.  Encouraged pt to use inspirometer more frequently to keep things loose in lungs and to continue to use pillow for splinting while coughing.  Pt had no f/u questions from education yesterday and is looking forward to Phase II rehab prior to returning to work. Alberteen Sam, MA, ACSM RCEP [phone]  Clotilde Dieter

## 2014-06-27 NOTE — Discharge Instructions (Signed)
Activity: 1.May walk up steps °               2.No lifting more than ten pounds for four weeks.  °               3.No driving for four weeks. °               4.Stop any activity that causes chest pain, shortness of breath, dizziness, sweating or excessive weakness. °               5.Avoid straining. °               6.Continue with your breathing exercises daily. ° °Diet: Diabetic diet and Low fat, Low salt diet ° °Wound Care: May shower.  Clean wounds with mild soap and water daily. Contact the office at [phone] if any problems arise. ° °Coronary Artery Bypass Grafting, Care After °Refer to this sheet in the next few weeks. These instructions provide you with information on caring for yourself after your procedure. Your health care provider may also give you more specific instructions. Your treatment has been planned according to current medical practices, but problems sometimes occur. Call your health care provider if you have any problems or questions after your procedure. °WHAT TO EXPECT AFTER THE PROCEDURE °Recovery from surgery will be different for everyone. Some people feel well after 3 or 4 weeks, while for others it takes longer. After your procedure, it is typical to have the following: °· Nausea and a lack of appetite.   °· Constipation. °· Weakness and fatigue.   °· Depression or irritability.   °· Pain or discomfort at your incision site. °HOME CARE INSTRUCTIONS °· Take medicines only as directed by your health care provider. Do not stop taking medicines or start any new medicines without first checking with your health care provider. °· Take your pulse as directed by your health care provider. °· Perform deep breathing as directed by your health care provider. If you were given a device called an incentive spirometer, use it to practice deep breathing several times a day. Support your chest with a pillow or your arms when you take deep breaths or cough. °· Keep incision areas clean, dry, and  protected. Remove or change any bandages (dressings) only as directed by your health care provider. You may have skin adhesive strips over the incision areas. Do not take the strips off. They will fall off on their own. °· Check incision areas daily for any swelling, redness, or drainage. °· If incisions were made in your legs, do the following: °¨ Avoid crossing your legs.   °¨ Avoid sitting for long periods of time. Change positions every 30 minutes.   °¨ Elevate your legs when you are sitting. °· Wear compression stockings as directed by your health care provider. These stockings help keep blood clots from forming in your legs. °· Take showers once your health care provider approves. Until then, only take sponge baths. Pat incisions dry. Do not rub incisions with a washcloth or towel. Do not take baths, swim, or use a hot tub until your health care provider approves. °· Eat foods that are high in fiber, such as raw fruits and vegetables, whole grains, beans, and nuts. Meats should be lean cut. Avoid canned, processed, and fried foods. °· Drink enough fluid to keep your urine clear or pale yellow. °· Weigh yourself every day. This helps identify if you are retaining fluid that may make your heart and lungs   work harder.  Rest and limit activity as directed by your health care provider. You may be instructed to:  Stop any activity at once if you have chest pain, shortness of breath, irregular heartbeats, or dizziness. Get help right away if you have any of these symptoms.  Move around frequently for short periods or take short walks as directed by your health care provider. Increase your activities gradually. You may need physical therapy or cardiac rehabilitation to help strengthen your muscles and build your endurance.  Avoid lifting, pushing, or pulling anything heavier than 10 lb (4.5 kg) for at least 6 weeks after surgery.  Do not drive until your health care provider approves.  Ask your health  care provider when you may return to work.  Ask your health care provider when you may resume sexual activity.  Keep all follow-up visits as directed by your health care provider. This is important. SEEK MEDICAL CARE IF:  You have swelling, redness, increasing pain, or drainage at the site of an incision.  You have a fever.  You have swelling in your ankles or legs.  You have pain in your legs.   You gain 2 or more pounds (0.9 kg) a day.  You are nauseous or vomit.  You have diarrhea. SEEK IMMEDIATE MEDICAL CARE IF:  You have chest pain that goes to your jaw or arms.  You have shortness of breath.   You have a fast or irregular heartbeat.   You notice a "clicking" in your breastbone (sternum) when you move.   You have numbness or weakness in your arms or legs.  You feel dizzy or light-headed.  MAKE SURE YOU:  Understand these instructions.  Will watch your condition.  Will get help right away if you are not doing well or get worse. Document Released: 07/08/2004 Document Revised: 05/05/2013 Document Reviewed: 05/28/2012 [facility] Patient Information 2015 [place], [place]. This information is not intended to replace advice given to you by your health care provider. Make sure you discuss any questions you have with your health care provider.   Information on my medicine - Pradaxa (dabigatran)  This medication education was reviewed with me or my healthcare representative as part of my discharge preparation.    Why was Pradaxa prescribed for you? Pradaxa was prescribed for you to reduce the risk of forming blood clots that cause a stroke if you have a medical condition called atrial fibrillation (a type of irregular heartbeat).    What do you Need to know about PradAXa? Take your Pradaxa TWICE DAILY - one capsule in the morning and one tablet in the evening with or without food.  It would be best to take the doses about the same time each day.  The capsules  should not be broken, chewed or opened - they must be swallowed whole.  Do not store Pradaxa in other medication containers - once the bottle is opened the Pradaxa should be used within FOUR months; throw away any capsules that havent been by that time.  Take Pradaxa exactly as prescribed by your doctor.  DO NOT stop taking Pradaxa without talking to the doctor who prescribed the medication.  Stopping without other stroke prevention medication to take the place of Pradaxa may increase your risk of developing a clot that causes a stroke.  Refill your prescription before you run out.  After discharge, you should have regular check-up appointments with your healthcare provider that is prescribing your Pradaxa.  In the future your dose may  need to be changed if your kidney function or weight changes by a significant amount.  What do you do if you miss a dose? If you miss a dose, take it as soon as you remember on the same day.  If your next dose is less than 6 hours away, skip the missed dose.  Do not take two doses of PRADAXA at the same time.  Important Safety Information A possible side effect of Pradaxa is bleeding. You should call your healthcare provider right away if you experience any of the following: ? Bleeding from an injury or your nose that does not stop. ? Unusual colored urine (red or dark brown) or unusual colored stools (red or black). ? Unusual bruising for unknown reasons. ? A serious fall or if you hit your head (even if there is no bleeding).  Some medicines may interact with Pradaxa and might increase your risk of bleeding or clotting while on Pradaxa. To help avoid this, consult your healthcare provider or pharmacist prior to using any new prescription or non-prescription medications, including herbals, vitamins, non-steroidal anti-inflammatory drugs (NSAIDs) and supplements.  This website has more information on Pradaxa (dabigatran): https://www.pradaxa.com

## 2014-06-27 NOTE — Progress Notes (Signed)
Hypoglycemic Event  CBG: 46  Treatment: 15 GM carbohydrate snack and 50% Dextrose Injection  Symptoms: None  Follow-up CBG: Time:06:15 CBG Result:42 06:36 CBG result 66, 06:55 CBG result 82  Possible Reasons for Event: Unknown  Comments/MD notified: see note    [name]  Remember to initiate Hypoglycemia Order Set & complete

## 2014-07-15 ENCOUNTER — Telehealth: Payer: Self-pay

## 2014-07-15 NOTE — Telephone Encounter (Signed)
PA form completed and faxed to [facility] for Librax for IBS. Pt has tried Lomotil alone and was helpful, but is much more effective in combination with Librax. Immodium was not effective in the past. Pending.

## 2014-07-19 ENCOUNTER — Encounter: Payer: Self-pay | Admitting: Cardiology

## 2014-07-19 DIAGNOSIS — Z951 Presence of aortocoronary bypass graft: Secondary | ICD-10-CM | POA: Insufficient documentation

## 2014-07-20 ENCOUNTER — Other Ambulatory Visit: Payer: Self-pay | Admitting: *Deleted

## 2014-07-20 DIAGNOSIS — G8918 Other acute postprocedural pain: Secondary | ICD-10-CM

## 2014-07-20 MED ORDER — OXYCODONE HCL 5 MG PO TABS: 5.0000 mg | ORAL_TABLET | ORAL | Status: DC | PRN

## 2014-07-20 NOTE — Telephone Encounter (Signed)
[name] has called for a pain med refill for Oxycodone s/p CABG. I informed him that a new signed script would be available at the front desk today and he understood.

## 2014-07-21 ENCOUNTER — Other Ambulatory Visit (INDEPENDENT_AMBULATORY_CARE_PROVIDER_SITE_OTHER): Payer: Medicare Other

## 2014-07-21 ENCOUNTER — Ambulatory Visit (INDEPENDENT_AMBULATORY_CARE_PROVIDER_SITE_OTHER): Payer: Medicare Other | Admitting: Cardiology

## 2014-07-21 ENCOUNTER — Encounter: Payer: Self-pay | Admitting: Cardiology

## 2014-07-21 VITALS — BP 110/70 | HR 94 | Ht 68.0 in | Wt 180.8 lb

## 2014-07-21 DIAGNOSIS — Z9229 Personal history of other drug therapy: Secondary | ICD-10-CM | POA: Insufficient documentation

## 2014-07-21 DIAGNOSIS — I480 Paroxysmal atrial fibrillation: Secondary | ICD-10-CM

## 2014-07-21 DIAGNOSIS — G4700 Insomnia, unspecified: Secondary | ICD-10-CM

## 2014-07-21 DIAGNOSIS — E785 Hyperlipidemia, unspecified: Secondary | ICD-10-CM

## 2014-07-21 DIAGNOSIS — I25810 Atherosclerosis of coronary artery bypass graft(s) without angina pectoris: Secondary | ICD-10-CM

## 2014-07-21 DIAGNOSIS — Z7901 Long term (current) use of anticoagulants: Secondary | ICD-10-CM

## 2014-07-21 DIAGNOSIS — I255 Ischemic cardiomyopathy: Secondary | ICD-10-CM

## 2014-07-21 LAB — LIPID PANEL
Cholesterol: 103 mg/dL (ref 0–200)
HDL: 40.2 mg/dL (ref >39.00)
LDL CALC: 39 mg/dL (ref 0–99)
NonHDL: 62.8
Total CHOL/HDL Ratio: 3
Triglycerides: 120 mg/dL (ref 0.0–149.0)
VLDL: 24 mg/dL (ref 0.0–40.0)

## 2014-07-21 LAB — HEPATIC FUNCTION PANEL
ALBUMIN: 4.3 g/dL (ref 3.5–5.2)
ALT: 28 U/L (ref 0–53)
AST: 18 U/L (ref 0–37)
Alkaline Phosphatase: 79 U/L (ref 39–117)
Bilirubin, Direct: 0.1 mg/dL (ref 0.0–0.3)
Total Bilirubin: 0.5 mg/dL (ref 0.2–1.2)
Total Protein: 7.5 g/dL (ref 6.0–8.3)

## 2014-07-21 LAB — BASIC METABOLIC PANEL
BUN: 14 mg/dL (ref 6–23)
CO2: 26 meq/L (ref 19–32)
CREATININE: 0.89 mg/dL (ref 0.40–1.50)
Calcium: 10 mg/dL (ref 8.4–10.5)
Chloride: 104 mEq/L (ref 96–112)
GFR: 90.05 mL/min (ref >60.00)
Glucose, Bld: 145 mg/dL — ABNORMAL HIGH (ref 70–99)
POTASSIUM: 4.3 meq/L (ref 3.5–5.1)
Sodium: 140 mEq/L (ref 135–145)

## 2014-07-21 MED ORDER — APIXABAN 5 MG PO TABS: 5.0000 mg | ORAL_TABLET | Freq: Two times a day (BID) | ORAL | Status: DC

## 2014-07-21 NOTE — Patient Instructions (Signed)
**Note De-Identified [name] Obfuscation** Medication Instructions:  Stop taking Pradaxa and start taking Eliquis 5 mg twice daily. Also, you can try taking Benadryl 25 mg 1 to 2 tablets at bedtime to help you sleep.  Labwork: BMET added  Testing/Procedures: None  Follow-Up: Your physician recommends that you schedule a follow-up appointment in: late August or early September

## 2014-07-21 NOTE — Progress Notes (Signed)
Cardiology Office Note   Date:  07/21/2014   ID:  [name], DOB 04-23-1945, MRN [id number]  PCP:  Leandrew Koyanagi, MD  Cardiologist:  Dola Argyle, MD   Chief Complaint  Patient presents with  . Appointment    Follow-up post CABG      History of Present Illness: [name] is a 69 y.o. male who presents today to follow-up coronary disease post CABG. When I saw him last the stress echo was abnormal and he was referred for cardiac catheterization. This was done and he ultimately underwent PCI on May 13, 2014. However he returned with recurrent symptoms and was admitted with chest discomfort. Repeat catheterization was done and decision was made to proceed with bypass surgery. On June 20 he received bypass surgery. In addition a maze procedure was done and a left atrial appendage clip was placed. Anticoagulation was recommended. Historically, the patient has not wanted anticoagulation. Ejection fraction during his hospitalization was in the range of 40-45%. However catheterization data suggested an EF in the 30-35% range.  He's having some chest wall pain. His sternum is quite stable. He is exercising well.  He has been having difficulty sleeping.    Past Medical History  Diagnosis Date  . Hypertension   . Elevated CPK     CPK elevated with normal MB and normal troponin the past  . Chest pain     Nuclear, 2006, no ischemia  . Atrial fibrillation     Paroxysmal, rare episodes, sinus rhythm on flecainide, patient prefers not to take Coumadin  . Arthritis     Right shoulder  . Ejection fraction     EF 55-60%,  echo, January, 2011  . Bradycardia     April, 2013  . Coronary artery disease     s/p staged cath 05/12/2014 and 5/11, DES x 2 to heavily calcified RCA, residual with 60% prox LAD, 70% D1  . Sinus drainage   . Diabetes mellitus without complication     type 2  . IBS (irritable bowel syndrome)     Past Surgical History  Procedure Laterality Date  .  Cardiac catheterization N/A 05/12/2014    Procedure: Left Heart Cath and Coronary Angiography;  Surgeon: Troy Sine, MD;  Location: [facility];  Service: Cardiovascular;  Laterality: N/A;  . Cardiovascular stress test  05/06/2014  . Appendectomy    . Cholecystectomy    . Cardiac catheterization N/A 05/13/2014    Procedure: Coronary/Graft Atherectomy;  Surgeon: Troy Sine, MD;  Location: [facility];  Service: Cardiovascular;  Laterality: N/A;  . Cardiac catheterization Right 05/13/2014    Procedure: Temporary Pacemaker;  Surgeon: Troy Sine, MD;  Location: [facility];  Service: Cardiovascular;  Laterality: Right;  . Cardiac catheterization N/A 05/13/2014    Procedure: Coronary Stent Intervention;  Surgeon: Troy Sine, MD;  Location: [facility];  Service: Cardiovascular;  Laterality: N/A;  . Coronary stent placement    . Cataract extraction    . Cardiac catheterization N/A 06/18/2014    Procedure: Left Heart Cath and Coronary Angiography;  Surgeon: Peter M Martinique, MD;  Location: [facility];  Service: Cardiovascular;  Laterality: N/A;  . Coronary artery bypass graft N/A 06/22/2014    Procedure: CORONARY ARTERY BYPASS GRAFTING (CABG) x five, using left internal mammary artery, and right leg greater saphenous vein harvested endoscopically;  Surgeon: Melrose Nakayama, MD;  Location: [facility];  Service: Open  Heart Surgery;  Laterality: N/A;  . Tee without cardioversion N/A 06/22/2014    Procedure: TRANSESOPHAGEAL ECHOCARDIOGRAM (TEE);  Surgeon: Melrose Nakayama, MD;  Location: [facility];  Service: Open Heart Surgery;  Laterality: N/A;  . Maze N/A 06/22/2014    Procedure: MAZE;  Surgeon: Melrose Nakayama, MD;  Location: [facility];  Service: Open Heart Surgery;  Laterality: N/A;    Patient Active Problem List   Diagnosis Date Noted  . Hx of CABG 07/19/2014  . Nonsustained ventricular tachycardia 06/23/2014  . Cardiomyopathy, ischemic   . Sinus  bradycardia 05/13/2014  . CAD (coronary artery disease) of artery bypass graft   . Junctional bradycardia   . CAD (coronary artery disease), native coronary artery 05/12/2014  . Hyperlipidemia 04/21/2013  . NASH (nonalcoholic steatohepatitis) 09/06/2011  . Rotator cuff tendinitis 07/28/2011  . Elevated CPK   . Hypertension   . Ejection fraction   . BPH (benign prostatic hyperplasia) 03/11/2011  . IBS (irritable bowel syndrome) 03/11/2011  . Overweight 03/11/2011  . Bursitis of hip 03/11/2011  . DM type 2 (diabetes mellitus, type 2) 01/21/2009  . ATRIAL FIBRILLATION, PAROXYSMAL 01/21/2009      Current Outpatient Prescriptions  Medication Sig Dispense Refill  . aspirin 81 MG tablet Take 81 mg by mouth daily.    Marland Kitchen Besifloxacin HCl (BESIVANCE OP) Place 1 drop into the left eye 3 (three) times daily. Post cataract surgery    . cephALEXin (KEFLEX) 500 MG capsule Take 1 capsule (500 mg total) by mouth 4 (four) times daily. 10 days 40 capsule 0  . clidinium-chlordiazePOXIDE (LIBRAX) 5-2.5 MG per capsule take 1 capsule by mouth three times a day before meals 270 capsule 0  . dabigatran (PRADAXA) 150 MG CAPS capsule Take 1 capsule (150 mg total) by mouth every 12 (twelve) hours. 60 capsule 3  . Difluprednate (DUREZOL OP) Place 1 drop into the left eye 4 (four) times daily as needed (post cataract surgery).     . diphenoxylate-atropine (LOMOTIL) 2.5-0.025 MG per tablet take 1 tablet by mouth four times a day if needed (Patient taking differently: Take 1 tablet by mouth 4 (four) times daily. To prevent diarrhea) 180 tablet 0  . fluticasone (FLONASE) 50 MCG/ACT nasal spray Place 1 spray into both nostrils as needed. (Patient taking differently: Place 1 spray into both nostrils as needed for allergies or rhinitis. ) 16 g 1  . furosemide (LASIX) 40 MG tablet Take 1 tablet (40 mg total) by mouth daily. For 7 Days 7 tablet 0  . glyBURIDE-metformin (GLUCOVANCE) 5-500 MG per tablet Take 1 tablet by mouth  daily with breakfast. (Patient taking differently: Take 1 tablet by mouth 3 (three) times daily. ) 180 tablet 0  . loteprednol (LOTEMAX) 0.5 % ophthalmic suspension Place 1 drop into both eyes daily as needed (dry eyes).    . metoprolol tartrate (LOPRESSOR) 25 MG tablet Take 1 tablet (25 mg total) by mouth 2 (two) times daily. 60 tablet 3  . Nepafenac (ILEVRO OP) Place 1 drop into the left eye daily. POST CATARACT LENSE SURGERY    . oxyCODONE (OXY IR/ROXICODONE) 5 MG immediate release tablet Take 1-2 tablets (5-10 mg total) by mouth every 3 (three) hours as needed for severe pain. 40 tablet 0  . potassium chloride SA (K-DUR,KLOR-CON) 20 MEQ tablet Take 1 tablet (20 mEq total) by mouth daily. For 7 Days 7 tablet 0  . ramipril (ALTACE) 5 MG capsule TAKE 1 CAPSULE DAILY. (Patient taking differently: Take 5 mg by  mouth daily. ) 90 capsule 3  . rosuvastatin (CRESTOR) 10 MG tablet Take 1 tablet (10 mg total) by mouth every Monday, Wednesday, and Friday. 15 tablet 11  . Soft Lens Products (AQUIFY MULTI-PURPOSE) SOLN Place 1 drop into both eyes daily as needed (dry eyes).    . tamsulosin (FLOMAX) 0.4 MG CAPS capsule Take 1 capsule (0.4 mg total) by mouth daily. (Patient taking differently: Take 0.4 mg by mouth daily as needed (for problem urinations). ) 90 capsule 0  . NITROSTAT 0.4 MG SL tablet   0   No current facility-administered medications for this visit.    Allergies:   Lipitor; Pravastatin; and Simvastatin    Social History:  The patient  reports that he has never smoked. He has never used smokeless tobacco. He reports that he does not drink alcohol or use illicit drugs.   Family History:  The patient's family history includes Alzheimer's disease in his mother; Arrhythmia in his sister; Cancer in his brother; Emphysema in his father; Heart attack in his sister; Heart disease in his sister; Hyperlipidemia in his sister; Hypertension in his sister.    ROS:  Please see the history of present  illness.    Patient denies fever, chills, headache, sweats, rash, change in vision, change in hearing, chest pain, cough, nausea or vomiting, urinary symptoms. All other systems are reviewed and are negative.    PHYSICAL EXAM: VS:  Ht 5\' 8"  (1.727 m)  Wt 180 lb 12.8 oz (82.01 kg)  BMI 27.50 kg/m2 ,  Patient is oriented to person time and place. Affect is normal. He's here with his wife. Head is atraumatic. Sclera and conjunctiva are normal. There is no jugular venous distention. Lungs are clear. Respiratory effort is nonlabored. Cardiac exam reveals S1 and S2. His chest point is nicely healed. Abdomen is soft. There is no peripheral edema. His leg wound is nicely healed. There are no musculoskeletal deformities. Neurologic is grossly intact.   EKG:   EKG is done today reviewed by me. There is sinus rhythm.   Recent Labs: 06/18/2014: B Natriuretic Peptide 246.3*; TSH 0.748 06/21/2014: ALT 41 06/23/2014: Magnesium 2.3 06/25/2014: BUN 22*; Creatinine, Ser 1.03; Hemoglobin 9.6*; Platelets 143*; Potassium 3.7; Sodium 137    Lipid Panel    Component Value Date/Time   CHOL 160 06/18/2014 0457   TRIG 121 06/18/2014 0457   HDL 39* 06/18/2014 0457   CHOLHDL 4.1 06/18/2014 0457   VLDL 24 06/18/2014 0457   LDLCALC 97 06/18/2014 0457      Wt Readings from Last 3 Encounters:  07/21/14 180 lb 12.8 oz (82.01 kg)  06/27/14 198 lb 3.1 oz (89.9 kg)  06/10/14 190 lb (86.183 kg)      Current medicines are reviewed  He understands his medications.     ASSESSMENT AND PLAN:

## 2014-07-21 NOTE — Assessment & Plan Note (Signed)
With his recent admission he had some left jugular dysfunction. He has an echo scheduled for the middle of August, 2016. I will be seeing him back after that.

## 2014-07-21 NOTE — Assessment & Plan Note (Signed)
>>  ASSESSMENT AND PLAN FOR CAD (CORONARY ARTERY DISEASE) OF ARTERY BYPASS GRAFT WRITTEN ON 07/21/2014 12:56 PM BY MICKY, JEFFREY D, MD  Patient underwent CABG June 22, 2014. He is doing very well. No change in therapy.

## 2014-07-21 NOTE — Assessment & Plan Note (Signed)
Patient underwent CABG June 22, 2014. He is doing very well. No change in therapy.

## 2014-07-21 NOTE — Assessment & Plan Note (Signed)
Historically we had treated his atrial fib with flecainide. He has had Maze procedure and left atrial appendage clip. He still leads to be anticoagulated and this will be done. EKG is done today and he is holding sinus rhythm.

## 2014-07-21 NOTE — Assessment & Plan Note (Signed)
He is in agreement that he should receive anticoagulation. He was placed on Pradaxa in the hospital. This has been very expensive. We checked and he can receive Eliquis a much better price. He understands that this is twice a day. He will use this medication.

## 2014-07-21 NOTE — Assessment & Plan Note (Signed)
He's had some insomnia since the hospital. I recommended a small dose of Benadryl.

## 2014-07-22 ENCOUNTER — Other Ambulatory Visit: Payer: Self-pay | Admitting: Thoracic Surgery (Cardiothoracic Vascular Surgery)

## 2014-07-22 DIAGNOSIS — Z951 Presence of aortocoronary bypass graft: Secondary | ICD-10-CM

## 2014-07-24 NOTE — Telephone Encounter (Signed)
I called yesterday to check on this PA and was told that they don't have a record of the fax I sent in on the 13th. I advised them I would send back in expedited review. This morning I had a notice of fax failure and I tried to send back in on covermymeds since [employer]'s recording directs there as their preferred method of PA. covermymeds said that "no PA needed, med is covered without PA". Called [employer] to clarify what is needed and why Rx will not process if nothing is needed. I spoke to 3 people (PA dept, customer service, and then pharmacy review). I was on phone over an hour, was put on hold SEVERAL times while reps checked on this med, told that it was a plan exclusion and was not covered at all, that there are no alternatives listed. I asked if no alternatives is pt just supposed to suffer..., can we appeal... The last person I spoke to put me on hold for "2-3 mins" and after 10+ I was disconnected. I called pt and relayed all of this info to him and he stated he would call them himself and try to get some answers as to what is needed or what meds are covered.

## 2014-07-27 ENCOUNTER — Telehealth: Payer: Self-pay | Admitting: Cardiology

## 2014-07-27 NOTE — Telephone Encounter (Signed)
He could try a combination of Valium or Librium plus Bentyl

## 2014-07-27 NOTE — Telephone Encounter (Signed)
**Note De-Identified [name] Obfuscation** The pt is advised to continue to take his medications as directed. He verbalized understanding and thanked me for my assistance.

## 2014-07-27 NOTE — Telephone Encounter (Signed)
Received denial of PA d/t plan exclusion and no method of appeal was offered. Also no alternatives listed. Called pt to report. He is still waiting for ins to call him back with alternatives. Dr Laney Pastor, do you know of any alternatives that might be inexpensive enough to pay cash for (pt already takes Lomotil when he has diarrhea).

## 2014-07-27 NOTE — Telephone Encounter (Signed)
New message    Patient calling    Pt c/o medication issue:  1. Name of Medication: on 3 blood thinner medication  Ramipril 5 mg , metoprolol  25 mg, eliquis  5 mg   2. How are you currently taking this medication (dosage and times per day)? Twice a day   3. Are you having a reaction (difficulty breathing--STAT)? No   4. What is your medication issue? praxada is discontinue .  He does not think he need to take all this medication especially the ramipril.

## 2014-07-28 ENCOUNTER — Ambulatory Visit
Admission: RE | Admit: 2014-07-28 | Discharge: 2014-07-28 | Disposition: A | Discharge status: Home / Self Care | Payer: Medicare Other | Source: Ambulatory Visit | Attending: Thoracic Surgery (Cardiothoracic Vascular Surgery) | Admitting: Thoracic Surgery (Cardiothoracic Vascular Surgery)

## 2014-07-28 ENCOUNTER — Ambulatory Visit (INDEPENDENT_AMBULATORY_CARE_PROVIDER_SITE_OTHER): Payer: Self-pay | Admitting: Thoracic Surgery (Cardiothoracic Vascular Surgery)

## 2014-07-28 ENCOUNTER — Encounter: Payer: Self-pay | Admitting: Thoracic Surgery (Cardiothoracic Vascular Surgery)

## 2014-07-28 VITALS — BP 121/73 | HR 77 | Resp 16 | Ht 68.0 in | Wt 180.0 lb

## 2014-07-28 DIAGNOSIS — Z951 Presence of aortocoronary bypass graft: Secondary | ICD-10-CM

## 2014-07-28 DIAGNOSIS — I2510 Atherosclerotic heart disease of native coronary artery without angina pectoris: Secondary | ICD-10-CM

## 2014-07-28 DIAGNOSIS — Z8679 Personal history of other diseases of the circulatory system: Secondary | ICD-10-CM

## 2014-07-28 DIAGNOSIS — R0789 Other chest pain: Secondary | ICD-10-CM | POA: Diagnosis not present

## 2014-07-28 DIAGNOSIS — Z9889 Other specified postprocedural states: Secondary | ICD-10-CM

## 2014-07-28 DIAGNOSIS — I480 Paroxysmal atrial fibrillation: Secondary | ICD-10-CM

## 2014-07-28 DIAGNOSIS — K7689 Other specified diseases of liver: Secondary | ICD-10-CM | POA: Diagnosis not present

## 2014-07-28 DIAGNOSIS — G8918 Other acute postprocedural pain: Secondary | ICD-10-CM

## 2014-07-28 MED ORDER — OXYCODONE HCL 5 MG PO TABS: 5.0000 mg | ORAL_TABLET | Freq: Two times a day (BID) | ORAL | Status: DC | PRN

## 2014-07-28 NOTE — Progress Notes (Signed)
[address]Suite [address]       [place] [address]             [id number]       HPI:  History [name] returns today for scheduled postoperative follow-up visit.  He is a 69 year old man who had coronary bypass grafting 4, a left-sided Maze procedure, and left atrial clip placement on 06/22/2014. He presented with a non-ST elevation MI. His ejection fraction preoperatively was 35%.  His postoperative course was notable for some episodes of atrial fibrillation. He was started on Pradaxa prior to discharge. That subsequently was changed to Eliquis due to affordability. He is not aware of any atrial fibrillation since discharge. He also had some sternal drainage and was treated with Keflex. That resolved.  He is still having some incisional pain and is using oxycodone, 5 mg, in the morning and at night. His exercise tolerance is improving. He complained of pain on the soles of his feet. He says this started right after surgery. It has improved, but has not completely resolved. He also complains of his voice being weak. He is anxious to resume normal activities.  Past Medical History  Diagnosis Date  . Hypertension   . Elevated CPK     CPK elevated with normal MB and normal troponin the past  . Chest pain     Nuclear, 2006, no ischemia  . Atrial fibrillation     Paroxysmal, rare episodes, sinus rhythm on flecainide, patient prefers not to take Coumadin  . Arthritis     Right shoulder  . Ejection fraction     EF 55-60%,  echo, January, 2011  . Bradycardia     April, 2013  . Coronary artery disease     s/p staged cath 05/12/2014 and 5/11, DES x 2 to heavily calcified RCA, residual with 60% prox LAD, 70% D1  . Sinus drainage   . Diabetes mellitus without complication     type 2  . IBS (irritable bowel syndrome)    Past Surgical History  Procedure Laterality Date  . Cardiac catheterization N/A 05/12/2014    Procedure: Left Heart Cath and Coronary Angiography;  Surgeon:  Troy Sine, MD;  Location: [facility];  Service: Cardiovascular;  Laterality: N/A;  . Cardiovascular stress test  05/06/2014  . Appendectomy    . Cholecystectomy    . Cardiac catheterization N/A 05/13/2014    Procedure: Coronary/Graft Atherectomy;  Surgeon: Troy Sine, MD;  Location: [facility];  Service: Cardiovascular;  Laterality: N/A;  . Cardiac catheterization Right 05/13/2014    Procedure: Temporary Pacemaker;  Surgeon: Troy Sine, MD;  Location: [facility];  Service: Cardiovascular;  Laterality: Right;  . Cardiac catheterization N/A 05/13/2014    Procedure: Coronary Stent Intervention;  Surgeon: Troy Sine, MD;  Location: [facility];  Service: Cardiovascular;  Laterality: N/A;  . Coronary stent placement    . Cataract extraction    . Cardiac catheterization N/A 06/18/2014    Procedure: Left Heart Cath and Coronary Angiography;  Surgeon: Peter M Martinique, MD;  Location: [facility];  Service: Cardiovascular;  Laterality: N/A;  . Coronary artery bypass graft N/A 06/22/2014    Procedure: CORONARY ARTERY BYPASS GRAFTING (CABG) x five, using left internal mammary artery, and right leg greater saphenous vein harvested endoscopically;  Surgeon: Melrose Nakayama, MD;  Location: [facility];  Service: Open Heart Surgery;  Laterality: N/A;  . Tee without  cardioversion N/A 06/22/2014    Procedure: TRANSESOPHAGEAL ECHOCARDIOGRAM (TEE);  Surgeon: Melrose Nakayama, MD;  Location: [facility];  Service: Open Heart Surgery;  Laterality: N/A;  . Maze N/A 06/22/2014    Procedure: MAZE;  Surgeon: Melrose Nakayama, MD;  Location: [facility];  Service: Open Heart Surgery;  Laterality: N/A;     Current Outpatient Prescriptions  Medication Sig Dispense Refill  . apixaban (ELIQUIS) 5 MG TABS tablet Take 1 tablet (5 mg total) by mouth 2 (two) times daily. 60 tablet 3  . aspirin 81 MG tablet Take 81 mg by mouth daily.    Marland Kitchen Besifloxacin HCl (BESIVANCE OP) Place 1 drop  into the left eye 3 (three) times daily. Post cataract surgery    . Difluprednate (DUREZOL OP) Place 1 drop into the left eye 4 (four) times daily as needed (post cataract surgery).     . diphenoxylate-atropine (LOMOTIL) 2.5-0.025 MG per tablet take 1 tablet by mouth four times a day if needed (Patient taking differently: Take 1 tablet by mouth 4 (four) times daily. To prevent diarrhea) 180 tablet 0  . fluticasone (FLONASE) 50 MCG/ACT nasal spray Place 1 spray into both nostrils as needed. (Patient taking differently: Place 1 spray into both nostrils as needed for allergies or rhinitis. ) 16 g 1  . glyBURIDE-metformin (GLUCOVANCE) 5-500 MG per tablet Take 1 tablet by mouth daily with breakfast. (Patient taking differently: Take 1 tablet by mouth 3 (three) times daily. ) 180 tablet 0  . loteprednol (LOTEMAX) 0.5 % ophthalmic suspension Place 1 drop into both eyes daily as needed (dry eyes).    . metoprolol tartrate (LOPRESSOR) 25 MG tablet Take 1 tablet (25 mg total) by mouth 2 (two) times daily. 60 tablet 3  . Nepafenac (ILEVRO OP) Place 1 drop into the left eye daily. POST CATARACT LENSE SURGERY    . NITROSTAT 0.4 MG SL tablet   0  . oxyCODONE (OXY IR/ROXICODONE) 5 MG immediate release tablet Take 1-2 tablets (5-10 mg total) by mouth 2 (two) times daily as needed for severe pain. 40 tablet 0  . ramipril (ALTACE) 5 MG capsule TAKE 1 CAPSULE DAILY. (Patient taking differently: Take 5 mg by mouth daily. ) 90 capsule 3  . rosuvastatin (CRESTOR) 10 MG tablet Take 1 tablet (10 mg total) by mouth every Monday, Wednesday, and Friday. 15 tablet 11  . Soft Lens Products (AQUIFY MULTI-PURPOSE) SOLN Place 1 drop into both eyes daily as needed (dry eyes).    . tamsulosin (FLOMAX) 0.4 MG CAPS capsule Take 1 capsule (0.4 mg total) by mouth daily. (Patient taking differently: Take 0.4 mg by mouth daily as needed (for problem urinations). ) 90 capsule 0   No current facility-administered medications for this visit.      Physical Exam BP 121/73 mmHg  Pulse 77  Resp 16  Ht 5' 8"  (1.727 m)  Wt 180 lb (81.647 kg)  BMI 27.38 kg/m2  SpO30 35% 69 year old man in no acute distress Well-developed and well-nourished Alert and oriented 3 with no focal deficits Lungs clear with equal breath sounds bilaterally Cardiac regular rate and rhythm normal S1 and S2 Sternum stable, incision healing well Leg incision healing well, no peripheral edema  Diagnostic Tests: His chest x-ray was reviewed and shows postoperative changes no effusion or infiltrate.  Rhythm strip shows sinus rhythm at 75 bpm  Impression: 69 year old man who is now about a month out from coronary bypass grafting, left-sided maze, and left atrial appendage clipping. He is  doing very well at this point in time. He is currently in sinus rhythm.  He is still having some incisional pain and is still using small doses of oxycodone a couple times a day. I gave him a prescription for an additional 40 oxycodone tablets to use twice daily when necessary.  He also complains of pain in the soles of his feet. This seems neuropathic in nature. I am not sure of the exact etiology of that, but it is improving.  He may begin driving, appropriate precautions were discussed.  He is not to lift anything over 10 pounds for another 2 weeks. After that his activities are unrestricted, but he was cautioned to build into new activities gradually. We are planning for him to return to work at 3 months postop.  Plan: He has an appointment with Dr. Ron Parker in mid August.  I'll be happy to see him back again at any time in the future if I can be of any further assistance with his care  Melrose Nakayama, MD [facility] [phone]

## 2014-08-04 MED ORDER — CHLORDIAZEPOXIDE HCL 5 MG PO CAPS: 5.0000 mg | ORAL_CAPSULE | Freq: Three times a day (TID) | ORAL | Status: DC

## 2014-08-04 MED ORDER — DICYCLOMINE HCL 20 MG PO TABS: 20.0000 mg | ORAL_TABLET | Freq: Three times a day (TID) | ORAL | Status: DC

## 2014-08-04 NOTE — Telephone Encounter (Signed)
Meds ordered this encounter  Medications  . chlordiazePOXIDE (LIBRIUM) 5 MG capsule    Sig: Take 1 capsule (5 mg total) by mouth 4 (four) times daily -  before meals and at bedtime.    Dispense:  120 capsule    Refill:  2  . dicyclomine (BENTYL) 20 MG tablet    Sig: Take 1 tablet (20 mg total) by mouth 4 (four) times daily -  before meals and at bedtime.    Dispense:  120 tablet    Refill:  2

## 2014-08-04 NOTE — Telephone Encounter (Signed)
Spoke to pt who is willing to try a combination of whichever Dr Laney Pastor thinks would be best, and least expensive if not covered by ins. Dr Laney Pastor, please send in Rxs you would like for pt to try. Thank you!

## 2014-08-07 ENCOUNTER — Other Ambulatory Visit: Payer: Self-pay | Admitting: Internal Medicine

## 2014-08-07 NOTE — Telephone Encounter (Signed)
Called to make sure pt is aware that Dr Laney Pastor sent the new meds in for him to try. Pt thanked [place].

## 2014-08-07 NOTE — Telephone Encounter (Signed)
Pt called back and reported that the Rx for Librium that Dr Laney Pastor just Rxd for him did not get to the pharm (Bentyl Rx did). It is a class IV controlled so probably has to be called in. Done.

## 2014-08-10 ENCOUNTER — Encounter: Payer: Self-pay | Admitting: *Deleted

## 2014-08-17 ENCOUNTER — Telehealth: Payer: Self-pay | Admitting: Cardiology

## 2014-08-17 ENCOUNTER — Other Ambulatory Visit: Payer: Self-pay

## 2014-08-17 ENCOUNTER — Ambulatory Visit (HOSPITAL_COMMUNITY): Payer: Medicare Other | Attending: Cardiology

## 2014-08-17 ENCOUNTER — Telehealth: Payer: Self-pay

## 2014-08-17 DIAGNOSIS — I5189 Other ill-defined heart diseases: Secondary | ICD-10-CM | POA: Diagnosis not present

## 2014-08-17 DIAGNOSIS — I255 Ischemic cardiomyopathy: Secondary | ICD-10-CM | POA: Diagnosis not present

## 2014-08-17 DIAGNOSIS — I340 Nonrheumatic mitral (valve) insufficiency: Secondary | ICD-10-CM | POA: Insufficient documentation

## 2014-08-17 NOTE — Telephone Encounter (Signed)
The pt arrived in the office for his scheduled Echocardiogram and asked to speak to me. The pt is requesting that when Dr Ron Parker reviews his Echo results to please let him know if he is cleared to play golf. He states that he is doing very well at this time. He is advised that I will send this message to Dr Ron Parker.

## 2014-08-17 NOTE — Telephone Encounter (Signed)
The pt is requesting samples of Eliquis 5 mg. He is advised to ask for me when he arrives in the office as we do have samples. He verbalized understanding.

## 2014-08-17 NOTE — Telephone Encounter (Signed)
New message     Pt has question regarding eliquis Please call to discuss

## 2014-08-18 ENCOUNTER — Encounter: Payer: Self-pay | Admitting: Physician Assistant

## 2014-08-19 ENCOUNTER — Telehealth: Payer: Self-pay | Admitting: *Deleted

## 2014-08-19 NOTE — Telephone Encounter (Signed)
Pt notified of echo results by phone with verbal understanding. Pt said he is just waiting for Dr. Ron Parker to tell him it is ok to play golf. I satted I saw that message from 8/15 and that Dr. Ron Parker nurse Jeani Hawking will let him now asap when she d/w Dr. Ron Parker.

## 2014-08-19 NOTE — Telephone Encounter (Signed)
Please notify the patient that his echo looks good. Motion of his heart is good. He is asked if he could play golf. Please help arrange for him to call Dr. Leonarda Salon office about this. It is up to the surgical doctors to decide when someone can return to golf after bypass surgery. I suspect, that the surgeons will not want him to use a full swing at this time.

## 2014-08-19 NOTE — Telephone Encounter (Signed)
Follow up     Pt is returning Carol's call regarding test results

## 2014-08-19 NOTE — Telephone Encounter (Signed)
I had just s/w pt about his echo results when Dr. Ron Parker replied to message about pt playing golf. So I cb pt and advised per Dr. Ron Parker to d/w Dr. Roxan Hockey if ok to play golf. Pt states appt w/Dr. Ron Parker 8/26 and will then d/w Dr. Roxan Hockey about golf.

## 2014-08-28 ENCOUNTER — Ambulatory Visit (INDEPENDENT_AMBULATORY_CARE_PROVIDER_SITE_OTHER): Payer: Medicare Other | Admitting: Cardiology

## 2014-08-28 ENCOUNTER — Encounter: Payer: Self-pay | Admitting: Cardiology

## 2014-08-28 VITALS — BP 110/52 | HR 73 | Ht 68.0 in | Wt 188.8 lb

## 2014-08-28 DIAGNOSIS — E785 Hyperlipidemia, unspecified: Secondary | ICD-10-CM | POA: Diagnosis not present

## 2014-08-28 DIAGNOSIS — I255 Ischemic cardiomyopathy: Secondary | ICD-10-CM

## 2014-08-28 DIAGNOSIS — I480 Paroxysmal atrial fibrillation: Secondary | ICD-10-CM | POA: Diagnosis not present

## 2014-08-28 DIAGNOSIS — R0989 Other specified symptoms and signs involving the circulatory and respiratory systems: Secondary | ICD-10-CM

## 2014-08-28 DIAGNOSIS — I25810 Atherosclerosis of coronary artery bypass graft(s) without angina pectoris: Secondary | ICD-10-CM | POA: Diagnosis not present

## 2014-08-28 DIAGNOSIS — R9430 Abnormal result of cardiovascular function study, unspecified: Secondary | ICD-10-CM

## 2014-08-28 MED ORDER — ROSUVASTATIN CALCIUM 10 MG PO TABS: 10.0000 mg | ORAL_TABLET | ORAL | Status: DC

## 2014-08-28 NOTE — Assessment & Plan Note (Signed)
He is doing very well after his bypass surgery. He is returning to full activities.

## 2014-08-28 NOTE — Progress Notes (Signed)
Cardiology Office Note   Date:  08/28/2014   ID:  [name], DOB 03-02-1945, MRN [id number]  PCP:  Leandrew Koyanagi, MD  Cardiologist:  Dola Argyle, MD   Chief Complaint  Patient presents with  . Appointment    Follow-up coronary artery disease      History of Present Illness: [name] is a 69 y.o. male who presents today to follow-up coronary disease. He is post-CABG and he continues to do very well. His surgery was June 22, 2014. In addition he had a maze procedure and a left atrial appendage clip was placed. He remains on anticoagulation. During his initial hospitalization his EF was in the 40-45% range. At the time of his last visit I arranged for a follow-up 2-D echo. EF had normalized at 55%. He feels great.    Past Medical History  Diagnosis Date  . Hypertension   . Elevated CPK     CPK elevated with normal MB and normal troponin the past  . Chest pain     Nuclear, 2006, no ischemia  . Atrial fibrillation     Paroxysmal, rare episodes, sinus rhythm on flecainide, patient prefers not to take Coumadin  . Arthritis     Right shoulder  . Ejection fraction     EF 55-60%,  echo, January, 2011  . Bradycardia     April, 2013  . Coronary artery disease     s/p staged cath 05/12/2014 and 5/11, DES x 2 to heavily calcified RCA, residual with 60% prox LAD, 70% D1  . Sinus drainage   . Diabetes mellitus without complication     type 2  . IBS (irritable bowel syndrome)   . History of echocardiogram     Echo 8/16: EF 55%, normal wall motion, grade 2 diastolic dysfunction, trivial MR, normal RV function, PASP 27 mmHg    Past Surgical History  Procedure Laterality Date  . Cardiac catheterization N/A 05/12/2014    Procedure: Left Heart Cath and Coronary Angiography;  Surgeon: Troy Sine, MD;  Location: [facility];  Service: Cardiovascular;  Laterality: N/A;  . Cardiovascular stress test  05/06/2014  . Appendectomy    . Cholecystectomy    . Cardiac  catheterization N/A 05/13/2014    Procedure: Coronary/Graft Atherectomy;  Surgeon: Troy Sine, MD;  Location: [facility];  Service: Cardiovascular;  Laterality: N/A;  . Cardiac catheterization Right 05/13/2014    Procedure: Temporary Pacemaker;  Surgeon: Troy Sine, MD;  Location: [facility];  Service: Cardiovascular;  Laterality: Right;  . Cardiac catheterization N/A 05/13/2014    Procedure: Coronary Stent Intervention;  Surgeon: Troy Sine, MD;  Location: [facility];  Service: Cardiovascular;  Laterality: N/A;  . Coronary stent placement    . Cataract extraction    . Cardiac catheterization N/A 06/18/2014    Procedure: Left Heart Cath and Coronary Angiography;  Surgeon: Peter M Martinique, MD;  Location: [facility];  Service: Cardiovascular;  Laterality: N/A;  . Coronary artery bypass graft N/A 06/22/2014    Procedure: CORONARY ARTERY BYPASS GRAFTING (CABG) x five, using left internal mammary artery, and right leg greater saphenous vein harvested endoscopically;  Surgeon: Melrose Nakayama, MD;  Location: [facility];  Service: Open Heart Surgery;  Laterality: N/A;  . Tee without cardioversion N/A 06/22/2014    Procedure: TRANSESOPHAGEAL ECHOCARDIOGRAM (TEE);  Surgeon: Melrose Nakayama, MD;  Location: [facility];  Service: Open Heart Surgery;  Laterality: N/A;  . Maze N/A 06/22/2014    Procedure: MAZE;  Surgeon: Melrose Nakayama, MD;  Location: [facility];  Service: Open Heart Surgery;  Laterality: N/A;    Patient Active Problem List   Diagnosis Date Noted  . Insomnia 07/21/2014  . HX: anticoagulation 07/21/2014  . Hx of CABG 07/19/2014  . Nonsustained ventricular tachycardia 06/23/2014  . Cardiomyopathy, ischemic   . Sinus bradycardia 05/13/2014  . CAD (coronary artery disease) of artery bypass graft   . Junctional bradycardia   . CAD (coronary artery disease), native coronary artery 05/12/2014  . Hyperlipidemia 04/21/2013  . NASH (nonalcoholic  steatohepatitis) 09/06/2011  . Rotator cuff tendinitis 07/28/2011  . Elevated CPK   . Hypertension   . Ejection fraction   . BPH (benign prostatic hyperplasia) 03/11/2011  . IBS (irritable bowel syndrome) 03/11/2011  . Overweight 03/11/2011  . Bursitis of hip 03/11/2011  . DM type 2 (diabetes mellitus, type 2) 01/21/2009  . ATRIAL FIBRILLATION, PAROXYSMAL 01/21/2009      Current Outpatient Prescriptions  Medication Sig Dispense Refill  . apixaban (ELIQUIS) 5 MG TABS tablet Take 1 tablet (5 mg total) by mouth 2 (two) times daily. 60 tablet 3  . aspirin 81 MG tablet Take 81 mg by mouth daily.    Marland Kitchen Besifloxacin HCl (BESIVANCE OP) Place 1 drop into the left eye 3 (three) times daily. Post cataract surgery    . chlordiazePOXIDE (LIBRIUM) 5 MG capsule Take 1 capsule (5 mg total) by mouth 4 (four) times daily -  before meals and at bedtime. 120 capsule 2  . clidinium-chlordiazePOXIDE (LIBRAX) 5-2.5 MG per capsule Take 1 capsule by mouth 3 (three) times daily before meals.    . dicyclomine (BENTYL) 20 MG tablet Take 1 tablet (20 mg total) by mouth 4 (four) times daily -  before meals and at bedtime. 120 tablet 2  . Difluprednate (DUREZOL OP) Place 1 drop into the left eye 4 (four) times daily as needed (post cataract surgery).     . diphenoxylate-atropine (LOMOTIL) 2.5-0.025 MG per tablet Take 1 tablet by mouth 4 (four) times daily as needed for diarrhea or loose stools.    . fluticasone (FLONASE) 50 MCG/ACT nasal spray Place 1 spray into both nostrils as needed for allergies or rhinitis.    Marland Kitchen glyBURIDE-metformin (GLUCOVANCE) 5-500 MG per tablet Take 1 tablet by mouth 3 (three) times daily.    Marland Kitchen loteprednol (LOTEMAX) 0.5 % ophthalmic suspension Place 1 drop into both eyes 3 (three) times daily as needed. As needed for dry eyes    . metoprolol tartrate (LOPRESSOR) 25 MG tablet Take 1 tablet (25 mg total) by mouth 2 (two) times daily. 60 tablet 3  . Nepafenac (ILEVRO OP) Place 1 drop into the left  eye daily. POST CATARACT LENSE SURGERY    . nitroGLYCERIN (NITROSTAT) 0.4 MG SL tablet Place 0.4 mg under the tongue every 5 (five) minutes as needed for chest pain.    Marland Kitchen oxyCODONE (OXY IR/ROXICODONE) 5 MG immediate release tablet Take 1-2 tablets (5-10 mg total) by mouth 2 (two) times daily as needed for severe pain. 40 tablet 0  . ramipril (ALTACE) 5 MG capsule Take 5 mg by mouth daily.    . rosuvastatin (CRESTOR) 10 MG tablet Take 1 tablet (10 mg total) by mouth every Monday, Wednesday, Friday, and Saturday at 6 PM. 20 tablet 0  . Soft Lens Products (AQUIFY MULTI-PURPOSE) SOLN Place 1 drop into both eyes daily as needed (dry eyes).    Marland Kitchen  tamsulosin (FLOMAX) 0.4 MG CAPS capsule Take 0.4 mg by mouth as needed. For urination problems     No current facility-administered medications for this visit.    Allergies:   Lipitor; Pravastatin; and Simvastatin    Social History:  The patient  reports that he has never smoked. He has never used smokeless tobacco. He reports that he does not drink alcohol or use illicit drugs.   Family History:  The patient's family history includes Alzheimer's disease in his mother; Arrhythmia in his sister; Cancer in his brother; Emphysema in his father; Heart attack in his sister; Heart disease in his sister; Hyperlipidemia in his sister; Hypertension in his sister.    ROS:  Please see the history of present illness.      Patient denies fever, chills, headache, sweats, rash, change in vision, change in hearing, chest pain, cough, nausea or vomiting, urinary symptoms. He does have some slight discomfort to superficial palpation of his anterior chest as his chest is healing. This continues to improve. All other systems are reviewed and are negative.  PHYSICAL EXAM: VS:  BP 110/52 mmHg  Pulse 73  Ht 5\' 8"  (1.727 m)  Wt 188 lb 12.8 oz (85.639 kg)  BMI 28.71 kg/m2  SpO2 99% , Patient is oriented to person time and place. Affect is normal. He's here with a family  member. Head is atraumatic. Sclera and conjunctiva are normal. There is no jugular venous distention. Lungs are clear. Respiratory effort is not labored. Cardiac exam reveals an S1 and S2. The abdomen is soft. There is no peripheral edema. There are no musculoskeletal deformities. There are no skin rashes.  EKG:   EKG is not done today.   Recent Labs: 06/18/2014: B Natriuretic Peptide 246.3*; TSH 0.748 06/23/2014: Magnesium 2.3 06/25/2014: Hemoglobin 9.6*; Platelets 143* 07/21/2014: ALT 28; BUN 14; Creatinine, Ser 0.89; Potassium 4.3; Sodium 140    Lipid Panel    Component Value Date/Time   CHOL 103 07/21/2014 0841   TRIG 120.0 07/21/2014 0841   HDL 40.20 07/21/2014 0841   CHOLHDL 3 07/21/2014 0841   VLDL 24.0 07/21/2014 0841   LDLCALC 39 07/21/2014 0841      Wt Readings from Last 3 Encounters:  08/28/14 188 lb 12.8 oz (85.639 kg)  07/28/14 180 lb (81.647 kg)  07/21/14 180 lb 12.8 oz (82.01 kg)      Current medicines are reviewed  The patient understands his medications.     ASSESSMENT AND PLAN:

## 2014-08-28 NOTE — Assessment & Plan Note (Signed)
The patient's ejection fraction has returned to normal by recent echo.

## 2014-08-28 NOTE — Assessment & Plan Note (Signed)
In the past the patient had significant symptoms from Lipitor. He is now tolerating Crestor 10 mg 3 days per week. We will increase him to 4 days per week for a month. If he tolerates this he will increase to 5 days per week for a month. This process will be continued until he is taking it 7 days per week if he tolerates it.

## 2014-08-28 NOTE — Assessment & Plan Note (Signed)
The patient is in sinus rhythm. Before surgery he had been on flecainide. He had a Maze procedure. He is holding sinus rhythm. Anticoagulation is continued.

## 2014-08-28 NOTE — Patient Instructions (Signed)
**Note De-Identified Cheryal Salas Obfuscation** Medication Instructions:  Increase Crestor to taking 1 tablet 4 times a week. Please call Jeani Hawking 1 week before you finish bottle and she will send in a new prescription for increased dose of taking 1 tablet 5 days a week. We will do this until you are eventually up to taking 1 tablet 7 days a week.  Labwork: None  Testing/Procedures: None  Follow-Up: Your physician wants you to follow-up in: 6 months. You will receive a reminder letter in the mail two months in advance. If you don't receive a letter, please call our office to schedule the follow-up appointment.

## 2014-08-28 NOTE — Assessment & Plan Note (Addendum)
>>  ASSESSMENT AND PLAN FOR CAD (CORONARY ARTERY DISEASE) WRITTEN ON 08/28/2014  4:43 PM BY Renda Pohlman D, MD  The patient is doing extremely well after PCI in May, 2016 followed by bypass surgery in June, 2016. He is returning to full activities. His ejection fraction has returned to normal. No further workup.   >>ASSESSMENT AND PLAN FOR CAD (CORONARY ARTERY DISEASE) OF ARTERY BYPASS GRAFT WRITTEN ON 08/28/2014  4:49 PM BY Bubba Vanbenschoten D, MD  He is doing very well after his bypass surgery. He is returning to full activities.

## 2014-09-03 ENCOUNTER — Encounter (HOSPITAL_COMMUNITY)
Admission: RE | Admit: 2014-09-03 | Discharge: 2014-09-03 | Disposition: A | Discharge status: Home / Self Care | Payer: Medicare Other | Source: Ambulatory Visit | Attending: Cardiology | Admitting: Cardiology

## 2014-09-03 DIAGNOSIS — Z79899 Other long term (current) drug therapy: Secondary | ICD-10-CM | POA: Insufficient documentation

## 2014-09-03 DIAGNOSIS — Z48812 Encounter for surgical aftercare following surgery on the circulatory system: Secondary | ICD-10-CM | POA: Insufficient documentation

## 2014-09-03 DIAGNOSIS — Z955 Presence of coronary angioplasty implant and graft: Secondary | ICD-10-CM | POA: Insufficient documentation

## 2014-09-03 NOTE — Progress Notes (Signed)
Cardiac Rehab Medication Review by a Pharmacist  Does the patient  feel that his/her medications are working for him/her?  yes  Has the patient been experiencing any side effects to the medications prescribed?  no  Does the patient measure his/her own blood pressure or blood glucose at home?  yes   Does the patient have any problems obtaining medications due to transportation or finances?   no  Understanding of regimen: good Understanding of indications: good Potential of compliance: excellent    Pharmacist comments: 69 y/o M presents to cardiac rehab in good spirits and no distress. Endorses good understanding and compliance of medication regimen. Patient checks his blood pressure and blood glucose daily.   Angela Burke, PharmD Pharmacy Resident Pager: [phone] 09/03/2014 8:37 AM

## 2014-09-09 ENCOUNTER — Encounter (HOSPITAL_COMMUNITY)
Admission: RE | Admit: 2014-09-09 | Discharge: 2014-09-09 | Disposition: A | Discharge status: Home / Self Care | Payer: Medicare Other | Source: Ambulatory Visit | Attending: Cardiology | Admitting: Cardiology

## 2014-09-09 ENCOUNTER — Encounter (HOSPITAL_COMMUNITY): Payer: Self-pay

## 2014-09-09 DIAGNOSIS — Z48812 Encounter for surgical aftercare following surgery on the circulatory system: Secondary | ICD-10-CM | POA: Diagnosis not present

## 2014-09-09 DIAGNOSIS — Z955 Presence of coronary angioplasty implant and graft: Secondary | ICD-10-CM | POA: Diagnosis not present

## 2014-09-09 DIAGNOSIS — Z79899 Other long term (current) drug therapy: Secondary | ICD-10-CM | POA: Diagnosis not present

## 2014-09-09 LAB — GLUCOSE, CAPILLARY
Glucose-Capillary: 251 mg/dL — ABNORMAL HIGH (ref 65–99)
Glucose-Capillary: 232 mg/dL — ABNORMAL HIGH (ref 65–99)

## 2014-09-09 NOTE — Progress Notes (Signed)
Pt started cardiac rehab today.  Pt tolerated light exercise without difficulty. VSS, telemetry-sinus rhythm, asymptomatic.  Medication list reconciled.  Pt verbalized compliance with medications and denies barriers to compliance. PSYCHOSOCIAL ASSESSMENT:  PH-0. Pt exhibits positive coping skills, hopeful outlook with supportive family. No psychosocial needs identified at this time, no psychosocial interventions necessary.    Pt enjoys playing golf.   Pt cardiac rehab  goal is  to increase strength and stamina.  Pt encouraged to participate in cardiac rehab activities and home exercises  to increase ability to achieve these goals.   Pt long term cardiac rehab goal is lose weight.  Pt encouraged to participate in nutrition education and home exercise to increase ability to achieve this goal. Pt oriented to exercise equipment and routine.  Understanding verbalized.

## 2014-09-11 ENCOUNTER — Encounter (HOSPITAL_COMMUNITY)
Admission: RE | Admit: 2014-09-11 | Discharge: 2014-09-11 | Disposition: A | Discharge status: Home / Self Care | Payer: Medicare Other | Source: Ambulatory Visit | Attending: Cardiology | Admitting: Cardiology

## 2014-09-11 ENCOUNTER — Telehealth: Payer: Self-pay | Admitting: Cardiology

## 2014-09-11 DIAGNOSIS — Z955 Presence of coronary angioplasty implant and graft: Secondary | ICD-10-CM | POA: Diagnosis not present

## 2014-09-11 DIAGNOSIS — Z79899 Other long term (current) drug therapy: Secondary | ICD-10-CM | POA: Diagnosis not present

## 2014-09-11 DIAGNOSIS — Z48812 Encounter for surgical aftercare following surgery on the circulatory system: Secondary | ICD-10-CM | POA: Diagnosis not present

## 2014-09-11 LAB — GLUCOSE, CAPILLARY: GLUCOSE-CAPILLARY: 217 mg/dL — AB (ref 65–99)

## 2014-09-11 NOTE — Telephone Encounter (Signed)
Received call from [facility] from Cardiac rehab who states since she called she has realized that the patient's leads were not in proper position and now monitor shows NSR.  She states they are uncertain as to whether patient was actually in a fib earlier or whether the apparent arrhythmia on monitor was due improper lead placement.  I advised her to allow patient to exercise and if he has atrial fib to sent monitor strips to our office for Dr. Ron Parker to review or to call back with concerns.  She verbalized understanding and agreement with plan.

## 2014-09-11 NOTE — Telephone Encounter (Signed)
New message      Pt is at cardiac rehab now and he is in AFIB.  [name] request to talk to a triage nurse

## 2014-09-14 ENCOUNTER — Encounter (HOSPITAL_COMMUNITY)
Admission: RE | Admit: 2014-09-14 | Discharge: 2014-09-14 | Disposition: A | Discharge status: Home / Self Care | Payer: Medicare Other | Source: Ambulatory Visit | Attending: Cardiology | Admitting: Cardiology

## 2014-09-14 DIAGNOSIS — Z79899 Other long term (current) drug therapy: Secondary | ICD-10-CM | POA: Diagnosis not present

## 2014-09-14 DIAGNOSIS — Z48812 Encounter for surgical aftercare following surgery on the circulatory system: Secondary | ICD-10-CM | POA: Diagnosis not present

## 2014-09-14 DIAGNOSIS — Z955 Presence of coronary angioplasty implant and graft: Secondary | ICD-10-CM | POA: Diagnosis not present

## 2014-09-14 LAB — GLUCOSE, CAPILLARY
GLUCOSE-CAPILLARY: 250 mg/dL — AB (ref 65–99)
GLUCOSE-CAPILLARY: 309 mg/dL — AB (ref 65–99)

## 2014-09-16 ENCOUNTER — Encounter (HOSPITAL_COMMUNITY)
Admission: RE | Admit: 2014-09-16 | Discharge: 2014-09-16 | Disposition: A | Discharge status: Home / Self Care | Payer: Medicare Other | Source: Ambulatory Visit | Attending: Cardiology | Admitting: Cardiology

## 2014-09-16 DIAGNOSIS — Z79899 Other long term (current) drug therapy: Secondary | ICD-10-CM | POA: Diagnosis not present

## 2014-09-16 DIAGNOSIS — Z48812 Encounter for surgical aftercare following surgery on the circulatory system: Secondary | ICD-10-CM | POA: Diagnosis not present

## 2014-09-16 DIAGNOSIS — Z955 Presence of coronary angioplasty implant and graft: Secondary | ICD-10-CM | POA: Diagnosis not present

## 2014-09-16 LAB — GLUCOSE, CAPILLARY: Glucose-Capillary: 245 mg/dL — ABNORMAL HIGH (ref 65–99)

## 2014-09-16 NOTE — Progress Notes (Signed)
QUALITY OF LIFE SCORE REVIEW: Patient quality of life slightly altered by physical constraints which limits ability to perform as prior to recent cardiac illness. Unfortunately, pt has had multiple cardiac hospitalizations this year.  Pt verbalizes he is encouraged now that his health is now stabilizing.  Pt quality of life scores are very good.   Offered emotional support and reassurance.  Will continue to monitor and intervene as necessary.

## 2014-09-18 ENCOUNTER — Encounter (HOSPITAL_COMMUNITY)
Admission: RE | Admit: 2014-09-18 | Discharge: 2014-09-18 | Disposition: A | Discharge status: Home / Self Care | Payer: Medicare Other | Source: Ambulatory Visit | Attending: Cardiology | Admitting: Cardiology

## 2014-09-18 DIAGNOSIS — Z48812 Encounter for surgical aftercare following surgery on the circulatory system: Secondary | ICD-10-CM | POA: Diagnosis not present

## 2014-09-18 DIAGNOSIS — Z955 Presence of coronary angioplasty implant and graft: Secondary | ICD-10-CM | POA: Diagnosis not present

## 2014-09-18 DIAGNOSIS — Z79899 Other long term (current) drug therapy: Secondary | ICD-10-CM | POA: Diagnosis not present

## 2014-09-18 LAB — GLUCOSE, CAPILLARY: Glucose-Capillary: 189 mg/dL — ABNORMAL HIGH (ref 65–99)

## 2014-09-21 ENCOUNTER — Encounter (HOSPITAL_COMMUNITY)
Admission: RE | Admit: 2014-09-21 | Discharge: 2014-09-21 | Disposition: A | Discharge status: Home / Self Care | Payer: Medicare Other | Source: Ambulatory Visit | Attending: Cardiology | Admitting: Cardiology

## 2014-09-21 DIAGNOSIS — Z48812 Encounter for surgical aftercare following surgery on the circulatory system: Secondary | ICD-10-CM | POA: Diagnosis not present

## 2014-09-21 DIAGNOSIS — Z79899 Other long term (current) drug therapy: Secondary | ICD-10-CM | POA: Diagnosis not present

## 2014-09-21 DIAGNOSIS — Z955 Presence of coronary angioplasty implant and graft: Secondary | ICD-10-CM | POA: Diagnosis not present

## 2014-09-21 LAB — GLUCOSE, CAPILLARY: Glucose-Capillary: 209 mg/dL — ABNORMAL HIGH (ref 65–99)

## 2014-09-23 ENCOUNTER — Encounter (HOSPITAL_COMMUNITY)
Admission: RE | Admit: 2014-09-23 | Discharge: 2014-09-23 | Disposition: A | Discharge status: Home / Self Care | Payer: Medicare Other | Source: Ambulatory Visit | Attending: Cardiology | Admitting: Cardiology

## 2014-09-23 DIAGNOSIS — Z48812 Encounter for surgical aftercare following surgery on the circulatory system: Secondary | ICD-10-CM | POA: Diagnosis not present

## 2014-09-23 DIAGNOSIS — Z955 Presence of coronary angioplasty implant and graft: Secondary | ICD-10-CM | POA: Diagnosis not present

## 2014-09-23 DIAGNOSIS — Z79899 Other long term (current) drug therapy: Secondary | ICD-10-CM | POA: Diagnosis not present

## 2014-09-25 ENCOUNTER — Encounter (HOSPITAL_COMMUNITY)
Admission: RE | Admit: 2014-09-25 | Discharge: 2014-09-25 | Disposition: A | Discharge status: Home / Self Care | Payer: Medicare Other | Source: Ambulatory Visit | Attending: Cardiology | Admitting: Cardiology

## 2014-09-25 DIAGNOSIS — Z48812 Encounter for surgical aftercare following surgery on the circulatory system: Secondary | ICD-10-CM | POA: Diagnosis not present

## 2014-09-25 DIAGNOSIS — Z79899 Other long term (current) drug therapy: Secondary | ICD-10-CM | POA: Diagnosis not present

## 2014-09-25 DIAGNOSIS — Z955 Presence of coronary angioplasty implant and graft: Secondary | ICD-10-CM | POA: Diagnosis not present

## 2014-09-28 ENCOUNTER — Encounter (HOSPITAL_COMMUNITY): Payer: Medicare Other

## 2014-09-30 ENCOUNTER — Encounter (HOSPITAL_COMMUNITY)
Admission: RE | Admit: 2014-09-30 | Discharge: 2014-09-30 | Disposition: A | Discharge status: Home / Self Care | Payer: Medicare Other | Source: Ambulatory Visit | Attending: Cardiology | Admitting: Cardiology

## 2014-09-30 DIAGNOSIS — Z955 Presence of coronary angioplasty implant and graft: Secondary | ICD-10-CM | POA: Diagnosis not present

## 2014-09-30 DIAGNOSIS — Z79899 Other long term (current) drug therapy: Secondary | ICD-10-CM | POA: Diagnosis not present

## 2014-09-30 DIAGNOSIS — Z48812 Encounter for surgical aftercare following surgery on the circulatory system: Secondary | ICD-10-CM | POA: Diagnosis not present

## 2014-10-02 ENCOUNTER — Encounter (HOSPITAL_COMMUNITY)
Admission: RE | Admit: 2014-10-02 | Discharge: 2014-10-02 | Disposition: A | Discharge status: Home / Self Care | Payer: Medicare Other | Source: Ambulatory Visit | Attending: Cardiology | Admitting: Cardiology

## 2014-10-02 DIAGNOSIS — Z79899 Other long term (current) drug therapy: Secondary | ICD-10-CM | POA: Diagnosis not present

## 2014-10-02 DIAGNOSIS — Z48812 Encounter for surgical aftercare following surgery on the circulatory system: Secondary | ICD-10-CM | POA: Diagnosis not present

## 2014-10-02 DIAGNOSIS — Z955 Presence of coronary angioplasty implant and graft: Secondary | ICD-10-CM | POA: Diagnosis not present

## 2014-10-05 ENCOUNTER — Encounter (HOSPITAL_COMMUNITY)
Admission: RE | Admit: 2014-10-05 | Discharge: 2014-10-05 | Disposition: A | Discharge status: Home / Self Care | Payer: Medicare Other | Source: Ambulatory Visit | Attending: Cardiology | Admitting: Cardiology

## 2014-10-05 DIAGNOSIS — Z955 Presence of coronary angioplasty implant and graft: Secondary | ICD-10-CM | POA: Diagnosis not present

## 2014-10-05 DIAGNOSIS — Z79899 Other long term (current) drug therapy: Secondary | ICD-10-CM | POA: Diagnosis not present

## 2014-10-05 DIAGNOSIS — Z48812 Encounter for surgical aftercare following surgery on the circulatory system: Secondary | ICD-10-CM | POA: Insufficient documentation

## 2014-10-05 NOTE — Progress Notes (Signed)
[name] 69 y.o. male Nutrition Note Spoke with pt. Nutrition Plan and Nutrition Survey goals reviewed with pt. Pt is following Step 1 of the Therapeutic Lifestyle Changes diet. Pt wants to lose wt. Wt loss tips briefly reviewed. Pt is diabetic. Last A1c indicates blood glucose poorly controlled. Pt checks his CBG's BID. Post-prandial CBG this morning was "185 mg/dL." This writer went over Diabetes Education test results. Per discussion, pt tries to "use low-sodium canned/convenience foods." The food label definition of low sodium discussed.  Pt states he adds No salt to food. Pt discouraged from using salt substitute and explanation provided. Pt expressed fair understanding of the information reviewed. Pt aware of nutrition education classes offered. Lab Results  Component Value Date   HGBA1C 8.9* 06/22/2014   Nutrition Diagnosis ? Food-and nutrition-related knowledge deficit related to lack of exposure to information as related to diagnosis of: ? CVD ? DM ? Overweight related to excessive energy intake as evidenced by a BMI of 28.7 ? Limited adherence to nutrition-related recommendations related to poor understanding or disinterest as evidenced by food history.  Nutrition RX/ Estimated Daily Nutrition Needs for: wt loss 1400-1900 Kcal, 35-50 gm fat, 9-13 gm sat fat, 1.4-1.9 gm trans-fat, <1500 mg sodium, 175-250 gm CHO   Nutrition Intervention ? Pt's individual nutrition plan reviewed with pt. ? Benefits of adopting Therapeutic Lifestyle Changes discussed when Medficts reviewed. ? Pt to attend the Portion Distortion class ? Pt to attend the Diabetes Q & A class ? Pt to attend the   ? Nutrition I class                  ? Nutrition II class     ? Diabetes Blitz class    ? Pt given handouts for: ? Nutrition I class ? Nutrition II class ? Diabetes Blitz Class ? Continue client-centered nutrition education by RD, as part of interdisciplinary care. Goal(s) ? Pt to identify and limit  food sources of saturated fat, trans fat, and cholesterol ? Pt to identify food quantities necessary to achieve: ? wt loss to a goal wt of 179-183 lb (81.6-83.4 kg) at graduation from cardiac rehab.  ? Pt to describe the benefit of including fruits, vegetables, whole grains, and low-fat dairy products in a heart healthy meal plan. ? CBG concentrations in the normal range or as close to normal as is safely possible. Monitor and Evaluate progress toward nutrition goal with team. Nutrition Risk: Change to Moderate Derek Mound, M.Ed, RD, LDN, CDE 10/05/2014 9:28 AM

## 2014-10-07 ENCOUNTER — Encounter (HOSPITAL_COMMUNITY)
Admission: RE | Admit: 2014-10-07 | Discharge: 2014-10-07 | Disposition: A | Discharge status: Home / Self Care | Payer: Medicare Other | Source: Ambulatory Visit | Attending: Cardiology | Admitting: Cardiology

## 2014-10-07 DIAGNOSIS — Z48812 Encounter for surgical aftercare following surgery on the circulatory system: Secondary | ICD-10-CM | POA: Diagnosis not present

## 2014-10-07 DIAGNOSIS — Z79899 Other long term (current) drug therapy: Secondary | ICD-10-CM | POA: Diagnosis not present

## 2014-10-07 DIAGNOSIS — Z955 Presence of coronary angioplasty implant and graft: Secondary | ICD-10-CM | POA: Diagnosis not present

## 2014-10-07 NOTE — Progress Notes (Signed)
[place] REVIEW No psychosocial needs identified, no intervention necessary. Pt is exercising on his own at home at [employer].  Pt feels he is making progress towards his cardiac rehab goals.

## 2014-10-09 ENCOUNTER — Encounter (HOSPITAL_COMMUNITY)
Admission: RE | Admit: 2014-10-09 | Discharge: 2014-10-09 | Disposition: A | Discharge status: Home / Self Care | Payer: Medicare Other | Source: Ambulatory Visit | Attending: Cardiology | Admitting: Cardiology

## 2014-10-09 DIAGNOSIS — Z955 Presence of coronary angioplasty implant and graft: Secondary | ICD-10-CM | POA: Diagnosis not present

## 2014-10-09 DIAGNOSIS — Z48812 Encounter for surgical aftercare following surgery on the circulatory system: Secondary | ICD-10-CM | POA: Diagnosis not present

## 2014-10-09 DIAGNOSIS — Z79899 Other long term (current) drug therapy: Secondary | ICD-10-CM | POA: Diagnosis not present

## 2014-10-12 ENCOUNTER — Encounter (HOSPITAL_COMMUNITY)
Admission: RE | Admit: 2014-10-12 | Discharge: 2014-10-12 | Disposition: A | Discharge status: Home / Self Care | Payer: Medicare Other | Source: Ambulatory Visit | Attending: Cardiology | Admitting: Cardiology

## 2014-10-12 DIAGNOSIS — Z48812 Encounter for surgical aftercare following surgery on the circulatory system: Secondary | ICD-10-CM | POA: Diagnosis not present

## 2014-10-12 DIAGNOSIS — Z79899 Other long term (current) drug therapy: Secondary | ICD-10-CM | POA: Diagnosis not present

## 2014-10-12 DIAGNOSIS — Z955 Presence of coronary angioplasty implant and graft: Secondary | ICD-10-CM | POA: Diagnosis not present

## 2014-10-14 ENCOUNTER — Encounter (HOSPITAL_COMMUNITY)
Admission: RE | Admit: 2014-10-14 | Discharge: 2014-10-14 | Disposition: A | Discharge status: Home / Self Care | Payer: Medicare Other | Source: Ambulatory Visit | Attending: Cardiology | Admitting: Cardiology

## 2014-10-14 DIAGNOSIS — Z48812 Encounter for surgical aftercare following surgery on the circulatory system: Secondary | ICD-10-CM | POA: Diagnosis not present

## 2014-10-14 DIAGNOSIS — Z955 Presence of coronary angioplasty implant and graft: Secondary | ICD-10-CM | POA: Diagnosis not present

## 2014-10-14 DIAGNOSIS — Z79899 Other long term (current) drug therapy: Secondary | ICD-10-CM | POA: Diagnosis not present

## 2014-10-16 ENCOUNTER — Encounter (HOSPITAL_COMMUNITY)
Admission: RE | Admit: 2014-10-16 | Discharge: 2014-10-16 | Disposition: A | Discharge status: Home / Self Care | Payer: Medicare Other | Source: Ambulatory Visit | Attending: Cardiology | Admitting: Cardiology

## 2014-10-16 DIAGNOSIS — Z955 Presence of coronary angioplasty implant and graft: Secondary | ICD-10-CM | POA: Diagnosis not present

## 2014-10-16 DIAGNOSIS — Z79899 Other long term (current) drug therapy: Secondary | ICD-10-CM | POA: Diagnosis not present

## 2014-10-16 DIAGNOSIS — Z48812 Encounter for surgical aftercare following surgery on the circulatory system: Secondary | ICD-10-CM | POA: Diagnosis not present

## 2014-10-19 ENCOUNTER — Encounter (HOSPITAL_COMMUNITY)
Admission: RE | Admit: 2014-10-19 | Discharge: 2014-10-19 | Disposition: A | Discharge status: Home / Self Care | Payer: Medicare Other | Source: Ambulatory Visit | Attending: Cardiology | Admitting: Cardiology

## 2014-10-19 DIAGNOSIS — Z955 Presence of coronary angioplasty implant and graft: Secondary | ICD-10-CM | POA: Diagnosis not present

## 2014-10-19 DIAGNOSIS — Z79899 Other long term (current) drug therapy: Secondary | ICD-10-CM | POA: Diagnosis not present

## 2014-10-19 DIAGNOSIS — Z48812 Encounter for surgical aftercare following surgery on the circulatory system: Secondary | ICD-10-CM | POA: Diagnosis not present

## 2014-10-21 ENCOUNTER — Encounter (HOSPITAL_COMMUNITY)
Admission: RE | Admit: 2014-10-21 | Discharge: 2014-10-21 | Disposition: A | Discharge status: Home / Self Care | Payer: Medicare Other | Source: Ambulatory Visit | Attending: Cardiology | Admitting: Cardiology

## 2014-10-21 DIAGNOSIS — Z79899 Other long term (current) drug therapy: Secondary | ICD-10-CM | POA: Diagnosis not present

## 2014-10-21 DIAGNOSIS — Z48812 Encounter for surgical aftercare following surgery on the circulatory system: Secondary | ICD-10-CM | POA: Diagnosis not present

## 2014-10-21 DIAGNOSIS — Z955 Presence of coronary angioplasty implant and graft: Secondary | ICD-10-CM | POA: Diagnosis not present

## 2014-10-23 ENCOUNTER — Encounter (HOSPITAL_COMMUNITY)
Admission: RE | Admit: 2014-10-23 | Discharge: 2014-10-23 | Disposition: A | Discharge status: Home / Self Care | Payer: Medicare Other | Source: Ambulatory Visit | Attending: Cardiology | Admitting: Cardiology

## 2014-10-23 DIAGNOSIS — Z48812 Encounter for surgical aftercare following surgery on the circulatory system: Secondary | ICD-10-CM | POA: Diagnosis not present

## 2014-10-23 DIAGNOSIS — Z955 Presence of coronary angioplasty implant and graft: Secondary | ICD-10-CM | POA: Diagnosis not present

## 2014-10-23 DIAGNOSIS — Z79899 Other long term (current) drug therapy: Secondary | ICD-10-CM | POA: Diagnosis not present

## 2014-10-26 ENCOUNTER — Encounter: Payer: Self-pay | Admitting: Family Medicine

## 2014-10-26 ENCOUNTER — Encounter (HOSPITAL_COMMUNITY)
Admission: RE | Admit: 2014-10-26 | Discharge: 2014-10-26 | Disposition: A | Discharge status: Home / Self Care | Payer: Medicare Other | Source: Ambulatory Visit | Attending: Cardiology | Admitting: Cardiology

## 2014-10-26 ENCOUNTER — Telehealth: Payer: Self-pay | Admitting: Family Medicine

## 2014-10-26 DIAGNOSIS — Z48812 Encounter for surgical aftercare following surgery on the circulatory system: Secondary | ICD-10-CM | POA: Diagnosis not present

## 2014-10-26 DIAGNOSIS — Z955 Presence of coronary angioplasty implant and graft: Secondary | ICD-10-CM | POA: Diagnosis not present

## 2014-10-26 DIAGNOSIS — Z79899 Other long term (current) drug therapy: Secondary | ICD-10-CM | POA: Diagnosis not present

## 2014-10-26 NOTE — Telephone Encounter (Signed)
SPOKE WITH PATIENT AND HE IS COMING IN ON 11/13/14 TO SEE DR. DOOLITTLE AT 1PM

## 2014-10-28 ENCOUNTER — Encounter (HOSPITAL_COMMUNITY)
Admission: RE | Admit: 2014-10-28 | Discharge: 2014-10-28 | Disposition: A | Discharge status: Home / Self Care | Payer: Medicare Other | Source: Ambulatory Visit | Attending: Cardiology | Admitting: Cardiology

## 2014-10-28 DIAGNOSIS — Z955 Presence of coronary angioplasty implant and graft: Secondary | ICD-10-CM | POA: Diagnosis not present

## 2014-10-28 DIAGNOSIS — Z79899 Other long term (current) drug therapy: Secondary | ICD-10-CM | POA: Diagnosis not present

## 2014-10-28 DIAGNOSIS — Z48812 Encounter for surgical aftercare following surgery on the circulatory system: Secondary | ICD-10-CM | POA: Diagnosis not present

## 2014-10-30 ENCOUNTER — Encounter (HOSPITAL_COMMUNITY): Payer: Medicare Other

## 2014-11-02 ENCOUNTER — Encounter (HOSPITAL_COMMUNITY)
Admission: RE | Admit: 2014-11-02 | Discharge: 2014-11-02 | Disposition: A | Discharge status: Home / Self Care | Payer: Medicare Other | Source: Ambulatory Visit | Attending: Cardiology | Admitting: Cardiology

## 2014-11-02 DIAGNOSIS — Z48812 Encounter for surgical aftercare following surgery on the circulatory system: Secondary | ICD-10-CM | POA: Diagnosis not present

## 2014-11-02 DIAGNOSIS — Z955 Presence of coronary angioplasty implant and graft: Secondary | ICD-10-CM | POA: Diagnosis not present

## 2014-11-02 DIAGNOSIS — Z79899 Other long term (current) drug therapy: Secondary | ICD-10-CM | POA: Diagnosis not present

## 2014-11-03 ENCOUNTER — Encounter (HOSPITAL_COMMUNITY): Payer: Self-pay

## 2014-11-03 NOTE — Progress Notes (Signed)
Pt graduated from cardiac rehab program today with completion of 22 exercise sessions in Phase II. Pt exited program early to return to work.  Pt maintained good attendance and progressed nicely during his participation in rehab as evidenced by increased MET level.   Medication list reconciled. Repeat  PHQ score- 0 .  Pt has made significant lifestyle changes and should be commended for his success. Pt feels he has achieved his goals during cardiac rehab.   Pt plans to continue exercising on his own at this [facility].

## 2014-11-04 ENCOUNTER — Encounter (HOSPITAL_COMMUNITY): Payer: Medicare Other

## 2014-11-06 ENCOUNTER — Encounter (HOSPITAL_COMMUNITY): Payer: Medicare Other

## 2014-11-09 ENCOUNTER — Encounter (HOSPITAL_COMMUNITY): Payer: Medicare Other

## 2014-11-11 ENCOUNTER — Encounter (HOSPITAL_COMMUNITY): Payer: Medicare Other

## 2014-11-13 ENCOUNTER — Ambulatory Visit: Payer: Self-pay | Admitting: Internal Medicine

## 2014-11-13 ENCOUNTER — Encounter (HOSPITAL_COMMUNITY): Payer: Medicare Other

## 2014-11-16 ENCOUNTER — Encounter (HOSPITAL_COMMUNITY): Payer: Medicare Other

## 2014-11-18 ENCOUNTER — Ambulatory Visit: Payer: Medicare Other | Admitting: Internal Medicine

## 2014-11-18 ENCOUNTER — Encounter (HOSPITAL_COMMUNITY): Payer: Medicare Other

## 2014-11-20 ENCOUNTER — Encounter (HOSPITAL_COMMUNITY): Payer: Medicare Other

## 2014-11-20 ENCOUNTER — Ambulatory Visit: Payer: Medicare Other | Admitting: Internal Medicine

## 2014-11-23 ENCOUNTER — Telehealth: Payer: Self-pay | Admitting: Interventional Cardiology

## 2014-11-23 ENCOUNTER — Encounter (HOSPITAL_COMMUNITY): Payer: Medicare Other

## 2014-11-23 NOTE — Telephone Encounter (Signed)
New Message   Pt calling to speak to RN   i could not help him other than telling the lynn to call him about his Eliquis   i asked about needing an appt and to give me more information   for the message pt just simply wants a call back from rn

## 2014-11-23 NOTE — Telephone Encounter (Signed)
The pt called requesting samples of Eliqius 5 mg as he states that he cannot afford his refill at this time. He is advised to call us back tomorrow to see if we have received any. He verbalized understanding.

## 2014-11-24 ENCOUNTER — Telehealth: Payer: Self-pay | Admitting: Interventional Cardiology

## 2014-11-24 NOTE — Telephone Encounter (Signed)
**Note De-Identified [name] Obfuscation** The pt is advised that I have left samples of Eliquis at our front desk and that he may pick up. He states that he will come by the office tomorrow to pick them up.

## 2014-11-24 NOTE — Telephone Encounter (Signed)
New Message  Pt requested to only speak w/ RN. Please call back and discuss

## 2014-11-25 ENCOUNTER — Encounter (HOSPITAL_COMMUNITY): Payer: Medicare Other

## 2014-11-30 ENCOUNTER — Encounter (HOSPITAL_COMMUNITY): Payer: Medicare Other

## 2014-11-30 ENCOUNTER — Encounter: Payer: Self-pay | Admitting: Internal Medicine

## 2014-12-01 ENCOUNTER — Encounter: Payer: Self-pay | Admitting: Interventional Cardiology

## 2014-12-02 ENCOUNTER — Encounter (HOSPITAL_COMMUNITY): Payer: Medicare Other

## 2014-12-04 ENCOUNTER — Encounter (HOSPITAL_COMMUNITY): Payer: Medicare Other

## 2014-12-07 ENCOUNTER — Encounter (HOSPITAL_COMMUNITY): Payer: Medicare Other

## 2014-12-09 ENCOUNTER — Encounter (HOSPITAL_COMMUNITY): Payer: Medicare Other

## 2014-12-11 ENCOUNTER — Ambulatory Visit (INDEPENDENT_AMBULATORY_CARE_PROVIDER_SITE_OTHER): Payer: Medicare Other | Admitting: Internal Medicine

## 2014-12-11 ENCOUNTER — Encounter (HOSPITAL_COMMUNITY): Payer: Medicare Other

## 2014-12-11 VITALS — BP 128/80 | HR 80 | Temp 98.0°F | Resp 16 | Ht 68.0 in | Wt 191.0 lb

## 2014-12-11 DIAGNOSIS — E119 Type 2 diabetes mellitus without complications: Secondary | ICD-10-CM | POA: Diagnosis not present

## 2014-12-11 DIAGNOSIS — I255 Ischemic cardiomyopathy: Secondary | ICD-10-CM

## 2014-12-11 DIAGNOSIS — Z23 Encounter for immunization: Secondary | ICD-10-CM | POA: Diagnosis not present

## 2014-12-11 LAB — POCT GLYCOSYLATED HEMOGLOBIN (HGB A1C): Hemoglobin A1C: 8.3

## 2014-12-11 MED ORDER — GLYBURIDE-METFORMIN 2.5-500 MG PO TABS: 1.0000 | ORAL_TABLET | Freq: Two times a day (BID) | ORAL | Status: DC

## 2014-12-11 NOTE — Progress Notes (Signed)
Subjective:    Patient ID: [name], male    DOB: July 23, 1945, 69 y.o.   MRN: [id number]  HPI  1) 2 stents may 2016, then sudden pain leading to 5 vessel bypass June '16 From Dr Ron Parker July f/u: "[name] is a 69 y.o. male who presents today to follow-up coronary disease post CABG. When I saw him last the stress echo was abnormal and he was referred for cardiac catheterization. This was done and he ultimately underwent PCI on May 13, 2014. However he returned with recurrent symptoms and was admitted with chest discomfort. Repeat catheterization was done and decision was made to proceed with bypass surgery. On June 20 he received bypass surgery. In addition a maze procedure was done and a left atrial appendage clip was placed. Anticoagulation was recommended. Historically, the patient has not wanted anticoagulation. Ejection fraction during his hospitalization was in the range of 40-45%. However catheterization data suggested an EF in the 30-35% range" Feels good now No further atr fib(maze) See echo august  2) L shoulder pain now like R 1 yr ag which resp to injec steroids  3) Head cold 10d now resolving-no cough  4) here to reesatb contr of DM--meds juggled in hosback on consis meds 4 weeks  Golf restarted OK parttime work restarted ok   Current outpatient prescriptions:  .  apixaban (ELIQUIS) 5 MG TABS tablet, Take 1 tablet (5 mg total) by mouth 2 (two) times daily., Disp: 60 tablet, Rfl: 3 .  aspirin 81 MG tablet, Take 81 mg by mouth daily., Disp: , Rfl:  .  chlordiazePOXIDE (LIBRIUM) 5 MG capsule, Take 1 capsule (5 mg total) by mouth 4 (four) times daily -  before meals and at bedtime., Disp: 120 capsule, Rfl: 2 .  clidinium-chlordiazePOXIDE (LIBRAX) 5-2.5 MG per capsule, Take 1 capsule by mouth 3 (three) times daily before meals., Disp: , Rfl:  .  dicyclomine (BENTYL) 20 MG tablet, Take 1 tablet (20 mg total) by mouth 4 (four) times daily -  before meals and at bedtime.,  Disp: 120 tablet, Rfl: 2 .  Difluprednate (DUREZOL OP), Place 1 drop into the left eye 4 (four) times daily as needed (post cataract surgery). , Disp: , Rfl:  .  fluticasone (FLONASE) 50 MCG/ACT nasal spray, Place 1 spray into both nostrils as needed for allergies or rhinitis., Disp: , Rfl:  .  ibuprofen (ADVIL,MOTRIN) 200 MG tablet, Take 400 mg by mouth every morning., Disp: , Rfl:  .  metoprolol tartrate (LOPRESSOR) 25 MG tablet, Take 1 tablet (25 mg total) by mouth 2 (two) times daily., Disp: 60 tablet, Rfl: 3 .  nitroGLYCERIN (NITROSTAT) 0.4 MG SL tablet, Place 0.4 mg under the tongue every 5 (five) minutes as needed for chest pain., Disp: , Rfl:  .  oxyCODONE (OXY IR/ROXICODONE) 5 MG immediate release tablet, Take 1-2 tablets (5-10 mg total) by mouth 2 (two) times daily as needed for severe pain., Disp: 40 tablet, Rfl: 0 .  ramipril (ALTACE) 5 MG capsule, Take 5 mg by mouth daily., Disp: , Rfl:  .  rosuvastatin (CRESTOR) 10 MG tablet, Take 1 tablet (10 mg total) by mouth every Monday, Wednesday, Friday, and Saturday at 6 PM., Disp: 20 tablet, Rfl: 0 .  tamsulosin (FLOMAX) 0.4 MG CAPS capsule, Take 0.4 mg by mouth as needed. For urination problems, Disp: , Rfl:  .  glyBURIDE-metformin (GLUCOVANCE) 2.5-500 MG tablet, Take 1 tablet by mouth 2 (two) times daily with a meal., Disp:  180 tablet, Rfl: 3 .  TRULICITY 1.61 WR/6.0AV SOPN, , Disp: , Rfl: 1---he stopped this   [place]-5y ago Dr Bing Neighbors been recalled-waiting  Review of Systems  Constitutional: Negative for activity change, appetite change, fatigue and unexpected weight change.  HENT: Negative for trouble swallowing.   Respiratory: Negative for chest tightness and shortness of breath.   Cardiovascular: Negative for chest pain, palpitations and leg swelling.  Gastrointestinal: Negative for abdominal pain.  Genitourinary: Negative for dysuria and difficulty urinating.  Neurological: Negative for headaches.  Psychiatric/Behavioral:  Negative for sleep disturbance.       Objective:   Physical Exam  Constitutional: He is oriented to person, place, and time. He appears well-developed and well-nourished. No distress.  HENT:  Head: Normocephalic and atraumatic.  Eyes: Pupils are equal, round, and reactive to light.  Neck: Normal range of motion.  Cardiovascular: Normal rate and regular rhythm.   Pulmonary/Chest: Effort normal. No respiratory distress.  Musculoskeletal: Normal range of motion.  L should tend to palp ant with decr abduc due to pain  Neurological: He is alert and oriented to person, place, and time.  No sens loss feet  Skin: Skin is warm and dry.  Psychiatric: He has a normal mood and affect. His behavior is normal.  Nursing note and vitals reviewed.  BP 128/80 mmHg  Pulse 80  Temp(Src) 98 F (36.7 C)  Resp 16  Ht 5' 8"  (1.727 m)  Wt 191 lb (86.637 kg)  BMI 29.05 kg/m2 Wt Readings from Last 3 Encounters:  12/11/14 191 lb (86.637 kg)  09/03/14 189 lb 13.1 oz (86.1 kg)  08/28/14 188 lb 12.8 oz (85.639 kg)  was down to 174 after surgery  Results for orders placed or performed in visit on 12/11/14  Microalbumin, urine  Result Value Ref Range   Microalb, Ur 0.2 Not estab mg/dL  POCT glycosylated hemoglobin (Hb A1C)  Result Value Ref Range   Hemoglobin A1C 8.3          Assessment & Plan:  Type 2 diabetes mellitus without complication, without long-term current use of insulin (HCC) - Plan: POCT glycosylated hemoglobin (Hb A1C), Microalbumin, urine, CANCELED: Microalbumin, urine  RC Cuff tend L shoulder Meds ordered this encounter  Medications  . DISCONTD: glyBURIDE-metformin (GLUCOVANCE) 1.25-250 MG tablet    Sig: he was taking tid    Refill:  0  . glyBURIDE-metformin (GLUCOVANCE) 2.5-500 MG tablet    Sig: Take 1 tablet by mouth 2 (two) times daily with a meal.    Dispense:  180 tablet    Refill:  3   Reck 69mo Reck shoulder--exercises given

## 2014-12-12 LAB — MICROALBUMIN, URINE: Microalb, Ur: 0.2 mg/dL

## 2014-12-14 ENCOUNTER — Encounter (HOSPITAL_COMMUNITY): Payer: Medicare Other

## 2014-12-15 ENCOUNTER — Encounter: Payer: Self-pay | Admitting: Cardiology

## 2014-12-17 ENCOUNTER — Telehealth: Payer: Self-pay | Admitting: Family Medicine

## 2014-12-17 ENCOUNTER — Encounter: Payer: Self-pay | Admitting: Family Medicine

## 2014-12-17 NOTE — Telephone Encounter (Signed)
SPOKE WITH DR HUNGS OFFICE AND THEY ARE FAXING OVER A COPY OF THE COLONOSCOPY FROM 2010.

## 2015-01-03 ENCOUNTER — Other Ambulatory Visit: Payer: Self-pay | Admitting: Internal Medicine

## 2015-02-24 NOTE — Progress Notes (Signed)
Patient ID: [name], male   DOB: February 28, 1945, 70 y.o.   MRN: [id number]     Cardiology Office Note   Date:  02/25/2015   ID:  [name], DOB 03-26-1945, MRN [id number]  PCP:  Leandrew Koyanagi, MD    No chief complaint on file.  Follow-up coronary artery disease  Wt Readings from Last 3 Encounters:  02/25/15 191 lb (86.637 kg)  12/11/14 191 lb (86.637 kg)  09/03/14 189 lb 13.1 oz (86.1 kg)       History of Present Illness: [name] is a 70 y.o. male  who presents today to follow-up coronary disease.  He had stents in 5/16 with rotational atherectomy.  He then had early restenosis and progression of left sided disease. He had surgery with Dr. Roxan Hockey.  He is post-CABG; after having surgery in June 2016.  He has some toe numbness intermittently. His surgery was June 22, 2014. In addition he had a maze procedure and a left atrial appendage clip was placed. He remains on anticoagulation. During his initial hospitalization his EF was in the 40-45% range. At the time of his last visit I arranged for a follow-up 2-D echo. EF had normalized at 55%. He feels great.  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.  He has occasional leg cramps.  No anginal symptoms. No palpitations like his prior atrial fibrillation. He did have left atrial appendage clipping during his open heart surgery.     Past Medical History  Diagnosis Date  . Hypertension   . Elevated CPK     CPK elevated with normal MB and normal troponin the past  . Chest pain     Nuclear, 2006, no ischemia  . Atrial fibrillation (HCC)     Paroxysmal, rare episodes, sinus rhythm on flecainide, patient prefers not to take Coumadin  . Arthritis     Right shoulder  . Ejection fraction     EF 55-60%,  echo, January, 2011  . Bradycardia     April, 2013  . Coronary artery disease     s/p staged cath 05/12/2014 and 5/11, DES x 2 to heavily calcified RCA, residual with 60% prox LAD, 70% D1  . Sinus  drainage   . Diabetes mellitus without complication ([place])     type 2  . IBS (irritable bowel syndrome)   . History of echocardiogram     Echo 8/16: EF 55%, normal wall motion, grade 2 diastolic dysfunction, trivial MR, normal RV function, PASP 27 mmHg    Past Surgical History  Procedure Laterality Date  . Cardiac catheterization N/A 05/12/2014    Procedure: Left Heart Cath and Coronary Angiography;  Surgeon: Troy Sine, MD;  Location: [facility];  Service: Cardiovascular;  Laterality: N/A;  . Cardiovascular stress test  05/06/2014  . Appendectomy    . Cholecystectomy    . Cardiac catheterization N/A 05/13/2014    Procedure: Coronary/Graft Atherectomy;  Surgeon: Troy Sine, MD;  Location: [facility];  Service: Cardiovascular;  Laterality: N/A;  . Cardiac catheterization Right 05/13/2014    Procedure: Temporary Pacemaker;  Surgeon: Troy Sine, MD;  Location: [facility];  Service: Cardiovascular;  Laterality: Right;  . Cardiac catheterization N/A 05/13/2014    Procedure: Coronary Stent Intervention;  Surgeon: Troy Sine, MD;  Location: [facility];  Service: Cardiovascular;  Laterality: N/A;  . Coronary stent placement    . Cataract extraction    .  Cardiac catheterization N/A 06/18/2014    Procedure: Left Heart Cath and Coronary Angiography;  Surgeon: Peter M Martinique, MD;  Location: [facility];  Service: Cardiovascular;  Laterality: N/A;  . Coronary artery bypass graft N/A 06/22/2014    Procedure: CORONARY ARTERY BYPASS GRAFTING (CABG) x five, using left internal mammary artery, and right leg greater saphenous vein harvested endoscopically;  Surgeon: Melrose Nakayama, MD;  Location: [facility];  Service: Open Heart Surgery;  Laterality: N/A;  . Tee without cardioversion N/A 06/22/2014    Procedure: TRANSESOPHAGEAL ECHOCARDIOGRAM (TEE);  Surgeon: Melrose Nakayama, MD;  Location: [facility];  Service: Open Heart Surgery;  Laterality: N/A;  . Maze  N/A 06/22/2014    Procedure: MAZE;  Surgeon: Melrose Nakayama, MD;  Location: [facility];  Service: Open Heart Surgery;  Laterality: N/A;     Current Outpatient Prescriptions  Medication Sig Dispense Refill  . apixaban (ELIQUIS) 5 MG TABS tablet Take 1 tablet (5 mg total) by mouth 2 (two) times daily. 60 tablet 3  . aspirin 81 MG tablet Take 81 mg by mouth daily.    Marland Kitchen dicyclomine (BENTYL) 20 MG tablet Take 1 tablet (20 mg total) by mouth 4 (four) times daily -  before meals and at bedtime. 120 tablet 2  . fluticasone (FLONASE) 50 MCG/ACT nasal spray Place 1 spray into both nostrils as needed for allergies or rhinitis.    Marland Kitchen glyBURIDE-metformin (GLUCOVANCE) 2.5-500 MG tablet Take 1 tablet by mouth 2 (two) times daily with a meal. 180 tablet 3  . ibuprofen (ADVIL,MOTRIN) 200 MG tablet Take 400 mg by mouth every morning.    . metoprolol tartrate (LOPRESSOR) 25 MG tablet Take 1 tablet (25 mg total) by mouth 2 (two) times daily. (Patient taking differently: Take 10 mg by mouth 2 (two) times daily. TAKE 1 TAB ON Monday, Wednesday, Friday TWICE DAILY) 60 tablet 3  . nitroGLYCERIN (NITROSTAT) 0.4 MG SL tablet Place 0.4 mg under the tongue every 5 (five) minutes as needed for chest pain.    Marland Kitchen oxyCODONE (OXY IR/ROXICODONE) 5 MG immediate release tablet Take 1-2 tablets (5-10 mg total) by mouth 2 (two) times daily as needed for severe pain. 40 tablet 0  . ramipril (ALTACE) 5 MG capsule Take 5 mg by mouth daily.    . rosuvastatin (CRESTOR) 10 MG tablet Take 1 tablet (10 mg total) by mouth every Monday, Wednesday, Friday, and Saturday at 6 PM. 20 tablet 0  . tamsulosin (FLOMAX) 0.4 MG CAPS capsule Take 0.4 mg by mouth as needed. For urination problems    . TRULICITY 6.57 QI/6.9GE SOPN Inject 0.5 mLs as directed once a week.   1   No current facility-administered medications for this visit.    Allergies:   Lipitor; Pravastatin; and Simvastatin    Social History:  The patient  reports that he has never  smoked. He has never used smokeless tobacco. He reports that he does not drink alcohol or use illicit drugs.   Family History:  The patient's family history includes Alzheimer's disease in his mother; Arrhythmia in his sister; Cancer in his brother; Emphysema in his father; Heart attack in his sister; Heart disease in his sister; Hyperlipidemia in his sister; Hypertension in his sister.    ROS:  Please see the history of present illness.   Otherwise, review of systems are positive for foot pain as noted above.   All other systems are reviewed and negative.    PHYSICAL EXAM: VS:  BP  122/80 mmHg  Pulse 80  Ht 5' 8"  (1.727 m)  Wt 191 lb (86.637 kg)  BMI 29.05 kg/m2 , BMI Body mass index is 29.05 kg/(m^2). GEN: Well nourished, well developed, in no acute distress HEENT: normal Neck: no JVD, carotid bruits, or masses Cardiac: RRR; no murmurs, rubs, or gallops,no edema  Respiratory:  clear to auscultation bilaterally, normal work of breathing GI: soft, nontender, nondistended, + BS MS: no deformity or atrophy Skin: warm and dry, no rash Neuro:  Strength and sensation are intact Psych: euthymic mood, full affect    Recent Labs: 06/18/2014: B Natriuretic Peptide 246.3*; TSH 0.748 06/23/2014: Magnesium 2.3 06/25/2014: Hemoglobin 9.6*; Platelets 143* 07/21/2014: ALT 28; BUN 14; Creatinine, Ser 0.89; Potassium 4.3; Sodium 140   Lipid Panel    Component Value Date/Time   CHOL 103 07/21/2014 0841   TRIG 120.0 07/21/2014 0841   HDL 40.20 07/21/2014 0841   CHOLHDL 3 07/21/2014 0841   VLDL 24.0 07/21/2014 0841   LDLCALC 39 07/21/2014 0841     Other studies Reviewed: Additional studies/ records that were reviewed today with results demonstrating: Calf reports reviewed from May and June 2016.   ASSESSMENT AND PLAN:  1. AFib: Currently on Eliquis. I wonder if he can come off of the Eliquis since he has had a Maze procedure and left atrial appendage clipping. He would like to come off of  the Eliquis. Will check with Dr. Roxan Hockey. Could consider a 30 day monitor to verify that he is not having any occult atrial fibrillation.  2. CAD: No angina.  COntinue aggressive secondary prevention.  Status post bypass surgery. 3. Hyperlipidemia: Lipids well controlled. Continue current lipid-lowering therapy. 4. Toe numbness:  May be related to diabetic neuropathy. 5. Diabetes: I stressed the importance of aggressive diabetes control to help prevent complications such as neuropathy.   Current medicines are reviewed at length with the patient today.  The patient concerns regarding his medicines were addressed.  The following changes have been made:  No change  Labs/ tests ordered today include:  No orders of the defined types were placed in this encounter.    Recommend 150 minutes/week of aerobic exercise Low fat, low carb, high fiber diet recommended  Disposition:   FU in 6 months   Teresita Madura., MD  02/25/2015 12:18 PM    [facility] [address], [place], [place]  [address] Phone: [phone]; Fax: [phone]

## 2015-02-25 ENCOUNTER — Ambulatory Visit (INDEPENDENT_AMBULATORY_CARE_PROVIDER_SITE_OTHER): Payer: Medicare Other | Admitting: Interventional Cardiology

## 2015-02-25 ENCOUNTER — Encounter: Payer: Self-pay | Admitting: Interventional Cardiology

## 2015-02-25 VITALS — BP 122/80 | HR 80 | Ht 68.0 in | Wt 191.0 lb

## 2015-02-25 DIAGNOSIS — Z951 Presence of aortocoronary bypass graft: Secondary | ICD-10-CM

## 2015-02-25 DIAGNOSIS — I2510 Atherosclerotic heart disease of native coronary artery without angina pectoris: Secondary | ICD-10-CM

## 2015-02-25 DIAGNOSIS — I480 Paroxysmal atrial fibrillation: Secondary | ICD-10-CM | POA: Diagnosis not present

## 2015-02-25 DIAGNOSIS — M79676 Pain in unspecified toe(s): Secondary | ICD-10-CM

## 2015-02-25 DIAGNOSIS — E1159 Type 2 diabetes mellitus with other circulatory complications: Secondary | ICD-10-CM | POA: Diagnosis not present

## 2015-02-25 DIAGNOSIS — E785 Hyperlipidemia, unspecified: Secondary | ICD-10-CM

## 2015-02-25 MED ORDER — METOPROLOL TARTRATE 25 MG PO TABS: 25.0000 mg | ORAL_TABLET | Freq: Two times a day (BID) | ORAL | Status: DC

## 2015-02-25 MED ORDER — NITROGLYCERIN 0.4 MG SL SUBL: 0.4000 mg | SUBLINGUAL_TABLET | SUBLINGUAL | Status: DC | PRN

## 2015-02-25 NOTE — Patient Instructions (Signed)
Medication Instructions:  Same-no changes  Labwork: None  Testing/Procedures: None  Follow-Up: Your physician wants you to follow-up in: 6 months. You will receive a reminder letter in the mail two months in advance. If you don't receive a letter, please call our office to schedule the follow-up appointment.      If you need a refill on your cardiac medications before your next appointment, please call your pharmacy.   

## 2015-02-26 NOTE — Addendum Note (Signed)
Addended by: Freada Bergeron on: 02/26/2015 10:07 AM   Modules accepted: Orders

## 2015-03-02 NOTE — Progress Notes (Signed)
Thanks Richardson Landry, What does " IMO" stand for.  I will order a monitor.

## 2015-03-10 ENCOUNTER — Ambulatory Visit (INDEPENDENT_AMBULATORY_CARE_PROVIDER_SITE_OTHER): Payer: Medicare Other | Admitting: Internal Medicine

## 2015-03-10 ENCOUNTER — Encounter: Payer: Self-pay | Admitting: Internal Medicine

## 2015-03-10 VITALS — BP 136/80 | HR 77 | Temp 98.1°F | Resp 16 | Ht 68.0 in | Wt 190.0 lb

## 2015-03-10 DIAGNOSIS — R351 Nocturia: Secondary | ICD-10-CM | POA: Diagnosis not present

## 2015-03-10 DIAGNOSIS — E785 Hyperlipidemia, unspecified: Secondary | ICD-10-CM

## 2015-03-10 DIAGNOSIS — M7582 Other shoulder lesions, left shoulder: Secondary | ICD-10-CM

## 2015-03-10 DIAGNOSIS — Z125 Encounter for screening for malignant neoplasm of prostate: Secondary | ICD-10-CM | POA: Diagnosis not present

## 2015-03-10 DIAGNOSIS — N401 Enlarged prostate with lower urinary tract symptoms: Secondary | ICD-10-CM

## 2015-03-10 DIAGNOSIS — I480 Paroxysmal atrial fibrillation: Secondary | ICD-10-CM | POA: Diagnosis not present

## 2015-03-10 DIAGNOSIS — I2510 Atherosclerotic heart disease of native coronary artery without angina pectoris: Secondary | ICD-10-CM | POA: Diagnosis not present

## 2015-03-10 DIAGNOSIS — Z1159 Encounter for screening for other viral diseases: Secondary | ICD-10-CM | POA: Diagnosis not present

## 2015-03-10 DIAGNOSIS — K7581 Nonalcoholic steatohepatitis (NASH): Secondary | ICD-10-CM

## 2015-03-10 DIAGNOSIS — E1159 Type 2 diabetes mellitus with other circulatory complications: Secondary | ICD-10-CM | POA: Diagnosis not present

## 2015-03-10 LAB — CBC WITH DIFFERENTIAL/PLATELET
BASOS PCT: 1 % (ref 0–1)
Basophils Absolute: 0.1 10*3/uL (ref 0.0–0.1)
EOS ABS: 0.1 10*3/uL (ref 0.0–0.7)
Eosinophils Relative: 2 % (ref 0–5)
HCT: 43.1 % (ref 39.0–52.0)
HEMOGLOBIN: 14.2 g/dL (ref 13.0–17.0)
Lymphocytes Relative: 24 % (ref 12–46)
Lymphs Abs: 1.4 10*3/uL (ref 0.7–4.0)
MCH: 29 pg (ref 26.0–34.0)
MCHC: 32.9 g/dL (ref 30.0–36.0)
MCV: 88 fL (ref 78.0–100.0)
MONO ABS: 0.5 10*3/uL (ref 0.1–1.0)
MPV: 10.2 fL (ref 8.6–12.4)
Monocytes Relative: 8 % (ref 3–12)
NEUTROS ABS: 3.8 10*3/uL (ref 1.7–7.7)
Neutrophils Relative %: 65 % (ref 43–77)
PLATELETS: 241 10*3/uL (ref 150–400)
RBC: 4.9 MIL/uL (ref 4.22–5.81)
RDW: 14.4 % (ref 11.5–15.5)
WBC: 5.9 10*3/uL (ref 4.0–10.5)

## 2015-03-10 LAB — POCT GLYCOSYLATED HEMOGLOBIN (HGB A1C): Hemoglobin A1C: 8.8

## 2015-03-10 MED ORDER — SILDENAFIL CITRATE 100 MG PO TABS: 50.0000 mg | ORAL_TABLET | Freq: Every day | ORAL | Status: DC | PRN

## 2015-03-10 MED ORDER — FLUTICASONE PROPIONATE 50 MCG/ACT NA SUSP: 1.0000 | NASAL | Status: DC | PRN

## 2015-03-10 NOTE — Progress Notes (Signed)
Subjective:    Patient ID: [name], male    DOB: July 28, 1945, 70 y.o.   MRN: [id number]  HPIf/u Patient Active Problem List   Diagnosis Date Noted  . DM type 2 (diabetes mellitus, type 2) ([facility]) 01/21/2009    Priority: High  . ATRIAL FIBRILLATION, PAROXYSMAL 01/21/2009    Priority: High  . Hypertension     Priority: Medium  . IBS (irritable bowel syndrome) 03/11/2011    Priority: Medium  . Overweight 03/11/2011    Priority: Medium  . Toe pain 02/25/2015  . Insomnia 07/21/2014  . HX: anticoagulation 07/21/2014  . Hx of CABG 07/19/2014  . Nonsustained ventricular tachycardia ([place]) 06/23/2014  . Cardiomyopathy, ischemic   . Sinus bradycardia 05/13/2014  . CAD (coronary artery disease) of artery bypass graft   . Junctional bradycardia   . CAD (coronary artery disease), native coronary artery 05/12/2014  . Hyperlipidemia 04/21/2013  . NASH (nonalcoholic steatohepatitis) 09/06/2011  . Rotator cuff tendinitis 07/28/2011  . Elevated CPK   . Ejection fraction   . BPH (benign prostatic hyperplasia) 03/11/2011  . Bursitis of hip 03/11/2011   Heart stable--no fib  Shoulder L "frozen"--not better with PT--wants injection like last yr on R  Out of trulicity 2 months Has been taking a month then off a month and was ok til out Too expensive to use as prescribed Review of Systems  Constitutional: Negative for activity change, appetite change, fatigue and unexpected weight change.  HENT: Negative for trouble swallowing.   Eyes: Negative for visual disturbance.  Respiratory: Negative for cough and shortness of breath.   Cardiovascular: Negative for chest pain, palpitations and leg swelling.  Gastrointestinal: Negative for abdominal pain.  Genitourinary: Negative for difficulty urinating.  Neurological: Negative for headaches.   Wt Readings from Last 3 Encounters:  03/10/15 190 lb (86.183 kg)  02/25/15 191 lb (86.637 kg)  12/11/14 191 lb (86.637 kg)       Objective:   Physical Exam  Constitutional: He is oriented to person, place, and time. He appears well-developed and well-nourished. No distress.  HENT:  Head: Normocephalic and atraumatic.  Eyes: Pupils are equal, round, and reactive to light.  Neck: Normal range of motion.  Cardiovascular: Normal rate and regular rhythm.   Pulmonary/Chest: Effort normal. No respiratory distress.  Musculoskeletal: Normal range of motion.  L shoulder with painful arc 75-120 Tender post shoulder Can't abd ag resist  Neurological: He is alert and oriented to person, place, and time.  Skin: Skin is warm and dry.  Psychiatric: He has a normal mood and affect. His behavior is normal.  Nursing note and vitals reviewed.  BP 136/80 mmHg  Pulse 77  Temp(Src) 98.1 F (36.7 C)  Resp 16  Ht 5' 8" (1.727 m)  Wt 190 lb (86.183 kg)  BMI 28.90 kg/m2   Procedure--under sterile procedure he wa sinjected with 3cc xylo 2% and 1cc Kenalog 40 massged after and should with impr ROM immediately     Assessment & Plan:  Type 2 diabetes mellitus with other circulatory complication, without long-term current use of insulin (HCC) - Plan: CBC with Differential/Platelet, POCT glycosylated hemoglobin (Hb A1C)  Paroxysmal atrial fibrillation (HCC)  Hyperlipidemia - Plan: Lipid panel  NASH (nonalcoholic steatohepatitis) - Plan: Comprehensive metabolic panel  Rotator cuff tendinitis, left---adv rom steadily--no lift >10 2 w  Need for hepatitis C screening test - Plan: Hepatitis C antibody  Screening for prostate cancer  BPH associated with nocturia - Plan: PSA  Meds ordered  this encounter  Medications  . fluticasone (FLONASE) 50 MCG/ACT nasal spray    Sig: Place 1 spray into both nostrils as needed for allergies or rhinitis.    Dispense:  16 g    Refill:  11  . sildenafil (VIAGRA) 100 MG tablet    Sig: Take 0.5-1 tablets (50-100 mg total) by mouth daily as needed for erectile dysfunction.    Dispense:  5 tablet    Refill:   11   Addend labs 3/10 Results for orders placed or performed in visit on 03/10/15  CBC with Differential/Platelet  Result Value Ref Range   WBC 5.9 4.0 - 10.5 K/uL   RBC 4.90 4.22 - 5.81 MIL/uL   Hemoglobin 14.2 13.0 - 17.0 g/dL   HCT 43.1 39.0 - 52.0 %   MCV 88.0 78.0 - 100.0 fL   MCH 29.0 26.0 - 34.0 pg   MCHC 32.9 30.0 - 36.0 g/dL   RDW 14.4 11.5 - 15.5 %   Platelets 241 150 - 400 K/uL   MPV 10.2 8.6 - 12.4 fL   Neutrophils Relative % 65 43 - 77 %   Neutro Abs 3.8 1.7 - 7.7 K/uL   Lymphocytes Relative 24 12 - 46 %   Lymphs Abs 1.4 0.7 - 4.0 K/uL   Monocytes Relative 8 3 - 12 %   Monocytes Absolute 0.5 0.1 - 1.0 K/uL   Eosinophils Relative 2 0 - 5 %   Eosinophils Absolute 0.1 0.0 - 0.7 K/uL   Basophils Relative 1 0 - 1 %   Basophils Absolute 0.1 0.0 - 0.1 K/uL   Smear Review Criteria for review not met   Comprehensive metabolic panel  Result Value Ref Range   Sodium 138 135 - 146 mmol/L   Potassium 4.5 3.5 - 5.3 mmol/L   Chloride 103 98 - 110 mmol/L   CO2 24 20 - 31 mmol/L   Glucose, Bld 145 (H) 65 - 99 mg/dL   BUN 11 7 - 25 mg/dL   Creat 0.83 0.70 - 1.25 mg/dL   Total Bilirubin 0.6 0.2 - 1.2 mg/dL   Alkaline Phosphatase 49 40 - 115 U/L   AST 23 10 - 35 U/L   ALT 36 9 - 46 U/L   Total Protein 7.3 6.1 - 8.1 g/dL   Albumin 4.4 3.6 - 5.1 g/dL   Calcium 9.8 8.6 - 10.3 mg/dL  Lipid panel  Result Value Ref Range   Cholesterol 143 125 - 200 mg/dL   Triglycerides 152 (H) <150 mg/dL   HDL 36 (L) >=40 mg/dL   Total CHOL/HDL Ratio 4.0 <=5.0 Ratio   VLDL 30 <30 mg/dL   LDL Cholesterol 77 <130 mg/dL  PSA  Result Value Ref Range   PSA 1.28 <=4.00 ng/mL  Hepatitis C antibody  Result Value Ref Range   HCV Ab NEGATIVE NEGATIVE  POCT glycosylated hemoglobin (Hb A1C)  Result Value Ref Range   Hemoglobin A1C 8.8   disc compliance DM meds or change due to expense Consider endocrine  

## 2015-03-11 LAB — COMPREHENSIVE METABOLIC PANEL
ALBUMIN: 4.4 g/dL (ref 3.6–5.1)
ALT: 36 U/L (ref 9–46)
AST: 23 U/L (ref 10–35)
Alkaline Phosphatase: 49 U/L (ref 40–115)
BILIRUBIN TOTAL: 0.6 mg/dL (ref 0.2–1.2)
BUN: 11 mg/dL (ref 7–25)
CHLORIDE: 103 mmol/L (ref 98–110)
CO2: 24 mmol/L (ref 20–31)
CREATININE: 0.83 mg/dL (ref 0.70–1.25)
Calcium: 9.8 mg/dL (ref 8.6–10.3)
GLUCOSE: 145 mg/dL — AB (ref 65–99)
Potassium: 4.5 mmol/L (ref 3.5–5.3)
SODIUM: 138 mmol/L (ref 135–146)
Total Protein: 7.3 g/dL (ref 6.1–8.1)

## 2015-03-11 LAB — PSA: PSA: 1.28 ng/mL (ref <=4.00)

## 2015-03-11 LAB — LIPID PANEL
CHOL/HDL RATIO: 4 ratio (ref <=5.0)
Cholesterol: 143 mg/dL (ref 125–200)
HDL: 36 mg/dL — ABNORMAL LOW (ref >=40)
LDL CALC: 77 mg/dL (ref <130)
Triglycerides: 152 mg/dL — ABNORMAL HIGH (ref <150)
VLDL: 30 mg/dL (ref <30)

## 2015-03-11 LAB — HEPATITIS C ANTIBODY: HCV AB: NEGATIVE

## 2015-03-12 ENCOUNTER — Encounter: Payer: Self-pay | Admitting: Internal Medicine

## 2015-03-24 ENCOUNTER — Ambulatory Visit (INDEPENDENT_AMBULATORY_CARE_PROVIDER_SITE_OTHER): Payer: Medicare Other | Admitting: Internal Medicine

## 2015-03-24 ENCOUNTER — Encounter: Payer: Self-pay | Admitting: Internal Medicine

## 2015-03-24 VITALS — BP 136/74 | HR 76 | Ht 68.0 in | Wt 192.2 lb

## 2015-03-24 DIAGNOSIS — I480 Paroxysmal atrial fibrillation: Secondary | ICD-10-CM | POA: Diagnosis not present

## 2015-03-24 DIAGNOSIS — I10 Essential (primary) hypertension: Secondary | ICD-10-CM

## 2015-03-24 DIAGNOSIS — I25810 Atherosclerosis of coronary artery bypass graft(s) without angina pectoris: Secondary | ICD-10-CM

## 2015-03-24 NOTE — Patient Instructions (Signed)
Medication Instructions:  Your physician recommends that you continue on your current medications as directed. Please refer to the Current Medication list given to you today.  Labwork: None ordered  Testing/Procedures: None ordered  Follow-Up: No follow up is needed at this time with Dr. Rayann Heman.  He will see you on an as needed basis.  If you need a refill on your cardiac medications before your next appointment, please call your pharmacy.  Thank you for choosing [facility]!!

## 2015-03-24 NOTE — Progress Notes (Signed)
Electrophysiology Office Note   Date:  03/24/2015   ID:  [name], DOB [birth year], MRN [id number]  PCP:  Leandrew Koyanagi, MD  Cardiologist:  Dr Beau Fanny Primary Electrophysiologist: Thompson Grayer, MD    Chief Complaint  Patient presents with  . Follow-up     History of Present Illness: [name] is a 70 y.o. male who presents today for electrophysiology evaluation.  He presents today for further consideration of stroke risks and consideration of stopping anticoagulation.  The patent has a h/o atrial fibrillation.  He underwent CABG with MAZE and LA appendage Clip by Dr Roxan Hockey 06/14/2014.  He has done well since without any symptoms of atrial fibrillation.  His chads2vasc score is at least 4.  He has been on eliquis and has tolerated this without difficulty.  He has had recent conversations with Dr Irish Lack about possibly stopping anticoagulation.  He is referred for further EP discussions related this this issue.   Today, he denies symptoms of palpitations, chest pain, shortness of breath, orthopnea, PND, lower extremity edema, claudication, dizziness, presyncope, syncope, bleeding, or neurologic sequela. The patient is tolerating medications without difficulties and is otherwise without complaint today.    Past Medical History  Diagnosis Date  . Hypertension   . Elevated CPK     CPK elevated with normal MB and normal troponin the past  . Chest pain     Nuclear, 2006, no ischemia  . Atrial fibrillation (HCC)     Paroxysmal, rare episodes, sinus rhythm on flecainide, patient prefers not to take Coumadin  . Arthritis     Right shoulder  . Ejection fraction     EF 55-60%,  echo, January, 2011  . Bradycardia     April, 2013  . Coronary artery disease     s/p staged cath 05/12/2014 and 5/11, DES x 2 to heavily calcified RCA, residual with 60% prox LAD, 70% D1  . Sinus drainage   . Diabetes mellitus without complication ([place])     type 2  . IBS (irritable bowel  syndrome)   . History of echocardiogram     Echo 8/16: EF 55%, normal wall motion, grade 2 diastolic dysfunction, trivial MR, normal RV function, PASP 27 mmHg   Past Surgical History  Procedure Laterality Date  . Cardiac catheterization N/A 05/12/2014    Procedure: Left Heart Cath and Coronary Angiography;  Surgeon: Troy Sine, MD;  Location: [facility];  Service: Cardiovascular;  Laterality: N/A;  . Cardiovascular stress test  05/06/2014  . Appendectomy    . Cholecystectomy    . Cardiac catheterization N/A 05/13/2014    Procedure: Coronary/Graft Atherectomy;  Surgeon: Troy Sine, MD;  Location: [facility];  Service: Cardiovascular;  Laterality: N/A;  . Cardiac catheterization Right 05/13/2014    Procedure: Temporary Pacemaker;  Surgeon: Troy Sine, MD;  Location: [facility];  Service: Cardiovascular;  Laterality: Right;  . Cardiac catheterization N/A 05/13/2014    Procedure: Coronary Stent Intervention;  Surgeon: Troy Sine, MD;  Location: [facility];  Service: Cardiovascular;  Laterality: N/A;  . Coronary stent placement    . Cataract extraction    . Cardiac catheterization N/A 06/18/2014    Procedure: Left Heart Cath and Coronary Angiography;  Surgeon: Peter M Martinique, MD;  Location: [facility];  Service: Cardiovascular;  Laterality: N/A;  . Coronary artery bypass graft N/A 06/22/2014    Procedure: CORONARY ARTERY BYPASS GRAFTING (CABG) x  five, using left internal mammary artery, and right leg greater saphenous vein harvested endoscopically;  Surgeon: Melrose Nakayama, MD;  Location: [facility];  Service: Open Heart Surgery;  Laterality: N/A;  . Tee without cardioversion N/A 06/22/2014    Procedure: TRANSESOPHAGEAL ECHOCARDIOGRAM (TEE);  Surgeon: Melrose Nakayama, MD;  Location: [facility];  Service: Open Heart Surgery;  Laterality: N/A;  . Maze N/A 06/22/2014    Procedure: MAZE;  Surgeon: Melrose Nakayama, MD;  Location: [facility];  Service:  Open Heart Surgery;  Laterality: N/A;     Current Outpatient Prescriptions  Medication Sig Dispense Refill  . apixaban (ELIQUIS) 5 MG TABS tablet Take 1 tablet (5 mg total) by mouth 2 (two) times daily. 60 tablet 3  . aspirin 81 MG tablet Take 81 mg by mouth daily.    Marland Kitchen dicyclomine (BENTYL) 20 MG tablet Take 1 tablet (20 mg total) by mouth 4 (four) times daily -  before meals and at bedtime. 120 tablet 2  . fluticasone (FLONASE) 50 MCG/ACT nasal spray Place 1 spray into both nostrils as needed for allergies or rhinitis. 16 g 11  . glyBURIDE-metformin (GLUCOVANCE) 2.5-500 MG tablet Take 1 tablet by mouth 2 (two) times daily with a meal. 180 tablet 3  . ibuprofen (ADVIL,MOTRIN) 200 MG tablet Take 400 mg by mouth every morning.    . metoprolol tartrate (LOPRESSOR) 25 MG tablet Take 1 tablet (25 mg total) by mouth 2 (two) times daily. 60 tablet 6  . nitroGLYCERIN (NITROSTAT) 0.4 MG SL tablet Place 1 tablet (0.4 mg total) under the tongue every 5 (five) minutes as needed for chest pain. 25 tablet 3  . oxyCODONE (OXY IR/ROXICODONE) 5 MG immediate release tablet Take 1-2 tablets (5-10 mg total) by mouth 2 (two) times daily as needed for severe pain. 40 tablet 0  . ramipril (ALTACE) 5 MG capsule Take 5 mg by mouth daily.    . rosuvastatin (CRESTOR) 10 MG tablet Take 1 tablet (10 mg total) by mouth every Monday, Wednesday, Friday, and Saturday at 6 PM. 20 tablet 0  . tamsulosin (FLOMAX) 0.4 MG CAPS capsule Take 0.4 mg by mouth as needed. For urination problems    . TRULICITY 3.22 GU/5.4YH SOPN Inject 0.5 mLs as directed once a week.   1   No current facility-administered medications for this visit.    Allergies:   Lipitor; Pravastatin; and Simvastatin   Social History:  The patient  reports that he has never smoked. He has never used smokeless tobacco. He reports that he does not drink alcohol or use illicit drugs.   Family History:  The patient's  family history includes Alzheimer's disease in his  mother; Arrhythmia in his sister; Cancer in his brother; Emphysema in his father; Heart attack in his sister; Heart disease in his sister; Hyperlipidemia in his sister; Hypertension in his sister.    ROS:  Please see the history of present illness.   All other systems are reviewed and negative.    PHYSICAL EXAM: VS:  BP 136/74 mmHg  Pulse 76  Ht 5' 8"  (1.727 m)  Wt 192 lb 3.2 oz (87.181 kg)  BMI 29.23 kg/m2 , BMI Body mass index is 29.23 kg/(m^2). GEN: Well nourished, well developed, in no acute distress HEENT: normal Neck: no JVD, carotid bruits, or masses Cardiac: RRR; no murmurs, rubs, or gallops,no edema  Respiratory:  clear to auscultation bilaterally, normal work of breathing GI: soft, nontender, nondistended, + BS MS: no deformity or atrophy Skin:  warm and dry  Neuro:  Strength and sensation are intact Psych: euthymic mood, full affect  Recent ecgs are reviewed and reveal sinus rhythm  Recent Labs: 06/18/2014: B Natriuretic Peptide 246.3*; TSH 0.748 06/23/2014: Magnesium 2.3 03/10/2015: ALT 36; BUN 11; Creat 0.83; Hemoglobin 14.2; Platelets 241; Potassium 4.5; Sodium 138    Lipid Panel     Component Value Date/Time   CHOL 143 03/10/2015 1656   TRIG 152* 03/10/2015 1656   HDL 36* 03/10/2015 1656   CHOLHDL 4.0 03/10/2015 1656   VLDL 30 03/10/2015 1656   LDLCALC 77 03/10/2015 1656     Wt Readings from Last 3 Encounters:  03/24/15 192 lb 3.2 oz (87.181 kg)  03/10/15 190 lb (86.183 kg)  02/25/15 191 lb (86.637 kg)      Other studies Reviewed: Dr Hassell Done notes, Dr Hendricksons operative note Review of the above records today demonstrates: though Dr Roxan Hockey lists left atrial appendage clip in operative note procedures, I do not see this later in the procedure description.  I do see a AtriClip on however on CXR.   ASSESSMENT AND PLAN:  1.  Paroxysmal atrial fibrillation Doing very well s/p MAZE without any symptomatic episodes of atrial fibrillation.   chads2vasc score is at least 4.  He therefore warrants long term anticoagulation. He is s/p AtriClip.  In the [employer] trial EXCLUDE, 98% of patients with AtriClip placement had complete closure by CT/ TEE at 3 months post implant.  This study however did not look at long term outcomes and stroke was not an endpoint of the trial. Though theoretically AtriClip placement should substitute for anticoagulation, there is no clinical trial outcomes data showing that this this device reduces risk of ischemic stroke in patients with atrial fibrillation.  I had a long discussion with the patient today.  I discussed options of 1. Discontinuing eliquis, 2. Continuing eliquis, 3. Obtaining TEE to ensure adequate closure of LAA prior to stopping eliquis, or 4. Implantable loop recorder to ensure that he has no recurrence of afib prior to stopping eliquis.  At this time, he states that he would like to continue eliquis long term.  If he has issues with bleeding then he would stop anticoagulation.  He is also considering implantable loop recorder placement for AF monitoring and subsequently stopping anticoagulation.  IF he decides to pursue this option then he will contact my office.  Otherwise, he will continue to follow with Dr Irish Lack.    2. CAD I think that we should stop ASA if we continue eliquis long term.  I will defer this decision ultimately to Dr Irish Lack.  3. HTN Stable No change required today  Follow-up with Dr Irish Lack as scheduled. I am happy to see when needed going forward.  Army Fossa, MD  03/24/2015 9:31 PM     [address] [place] [address] [phone] (office) [phone] (fax)

## 2015-06-01 ENCOUNTER — Telehealth: Payer: Self-pay

## 2015-06-01 NOTE — Telephone Encounter (Signed)
Patient would like a note excusing him from jury duty on July 18th. Patient states he had a heart attact and currently has IBS. Please advise! [phone]

## 2015-06-01 NOTE — Telephone Encounter (Signed)
Can we write letter?

## 2015-06-03 NOTE — Telephone Encounter (Signed)
Can give him letter that says he has IBS with frequent diarrhea in public places and must be able to leave any situation immediately. This might interfere with sitting thru a court proceeding

## 2015-06-04 DIAGNOSIS — H16142 Punctate keratitis, left eye: Secondary | ICD-10-CM | POA: Diagnosis not present

## 2015-06-07 NOTE — Telephone Encounter (Signed)
Letter available, please sign.

## 2015-06-10 NOTE — Telephone Encounter (Signed)
Pt advised.

## 2015-06-16 ENCOUNTER — Other Ambulatory Visit: Payer: Self-pay | Admitting: Interventional Cardiology

## 2015-06-16 MED ORDER — APIXABAN 5 MG PO TABS: 5.0000 mg | ORAL_TABLET | Freq: Two times a day (BID) | ORAL | Status: DC

## 2015-06-29 IMAGING — CR DG CHEST 1V PORT
1 series · 1 of 1 positions shown · non-contrast
Comparison: 06/17/2014

CLINICAL DATA: Postop CABG

EXAM:
PORTABLE CHEST - 1 VIEW

[ap]
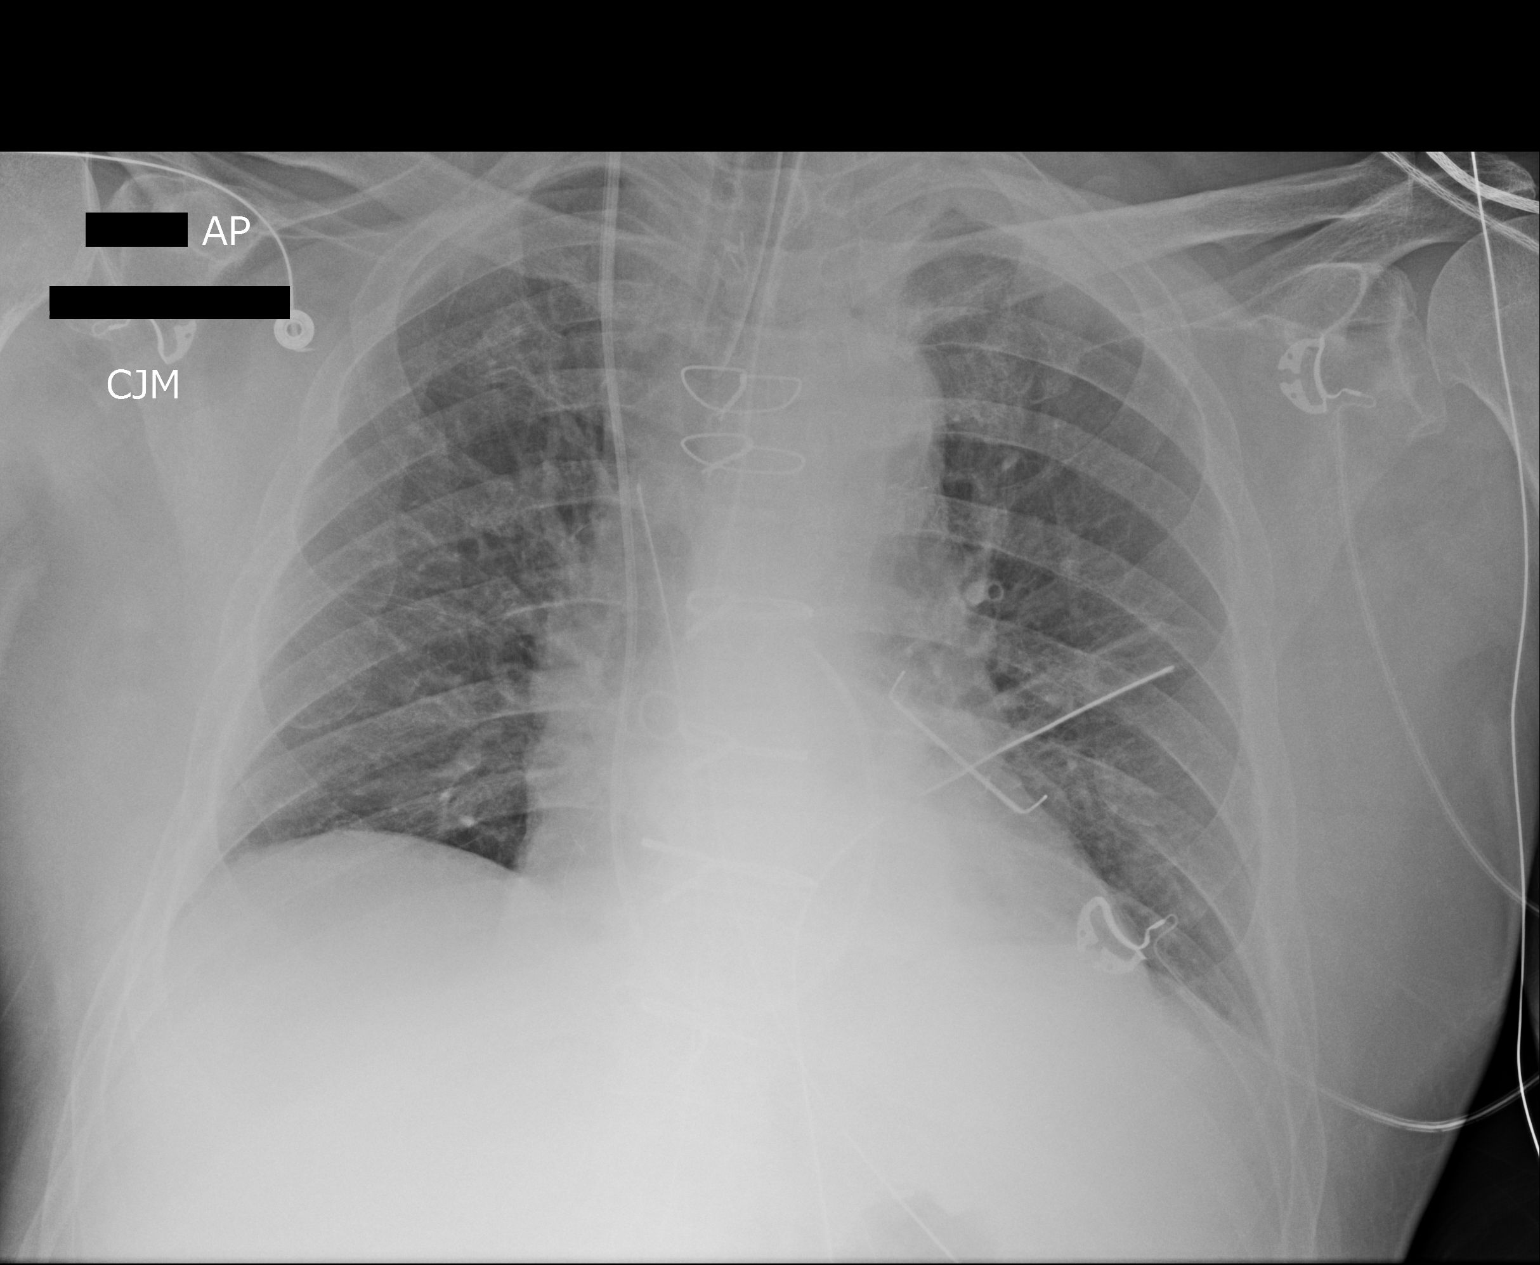

[1 of 1 positions shown; findings below may reference images not displayed]

FINDINGS: There is a new endotracheal tube with tip just below the clavicular
heads. An orogastric tube enters the stomach.

Right IJ approach Swan-Ganz catheter with tip near the main
pulmonary artery. Thoracic drains noted. There are new changes of
CABG and left atrial exclusion.

Stable heart size and mediastinal contours for technique. Low lung
volumes with mild retrocardiac atelectasis. There is mild pleural
thickening at the left apex, less than the interspace, which could
be from prominent extrapleural flat in the setting of low volumes,
or postoperative hematoma.
IMPRESSION: 1. Unremarkable positioning of tubes and central line.
2. New mild extrapleural thickening at the left apex which could be
from lower volumes or small hematoma.
3. Mild retrocardiac atelectasis.

## 2015-06-30 IMAGING — CR DG CHEST 1V PORT
1 series · 1 of 1 positions shown · non-contrast
Comparison: June 22, 2014

CLINICAL DATA: Coronary artery disease ; status post coronary
artery bypass grafting

EXAM:
PORTABLE CHEST - 1 VIEW

[AP]
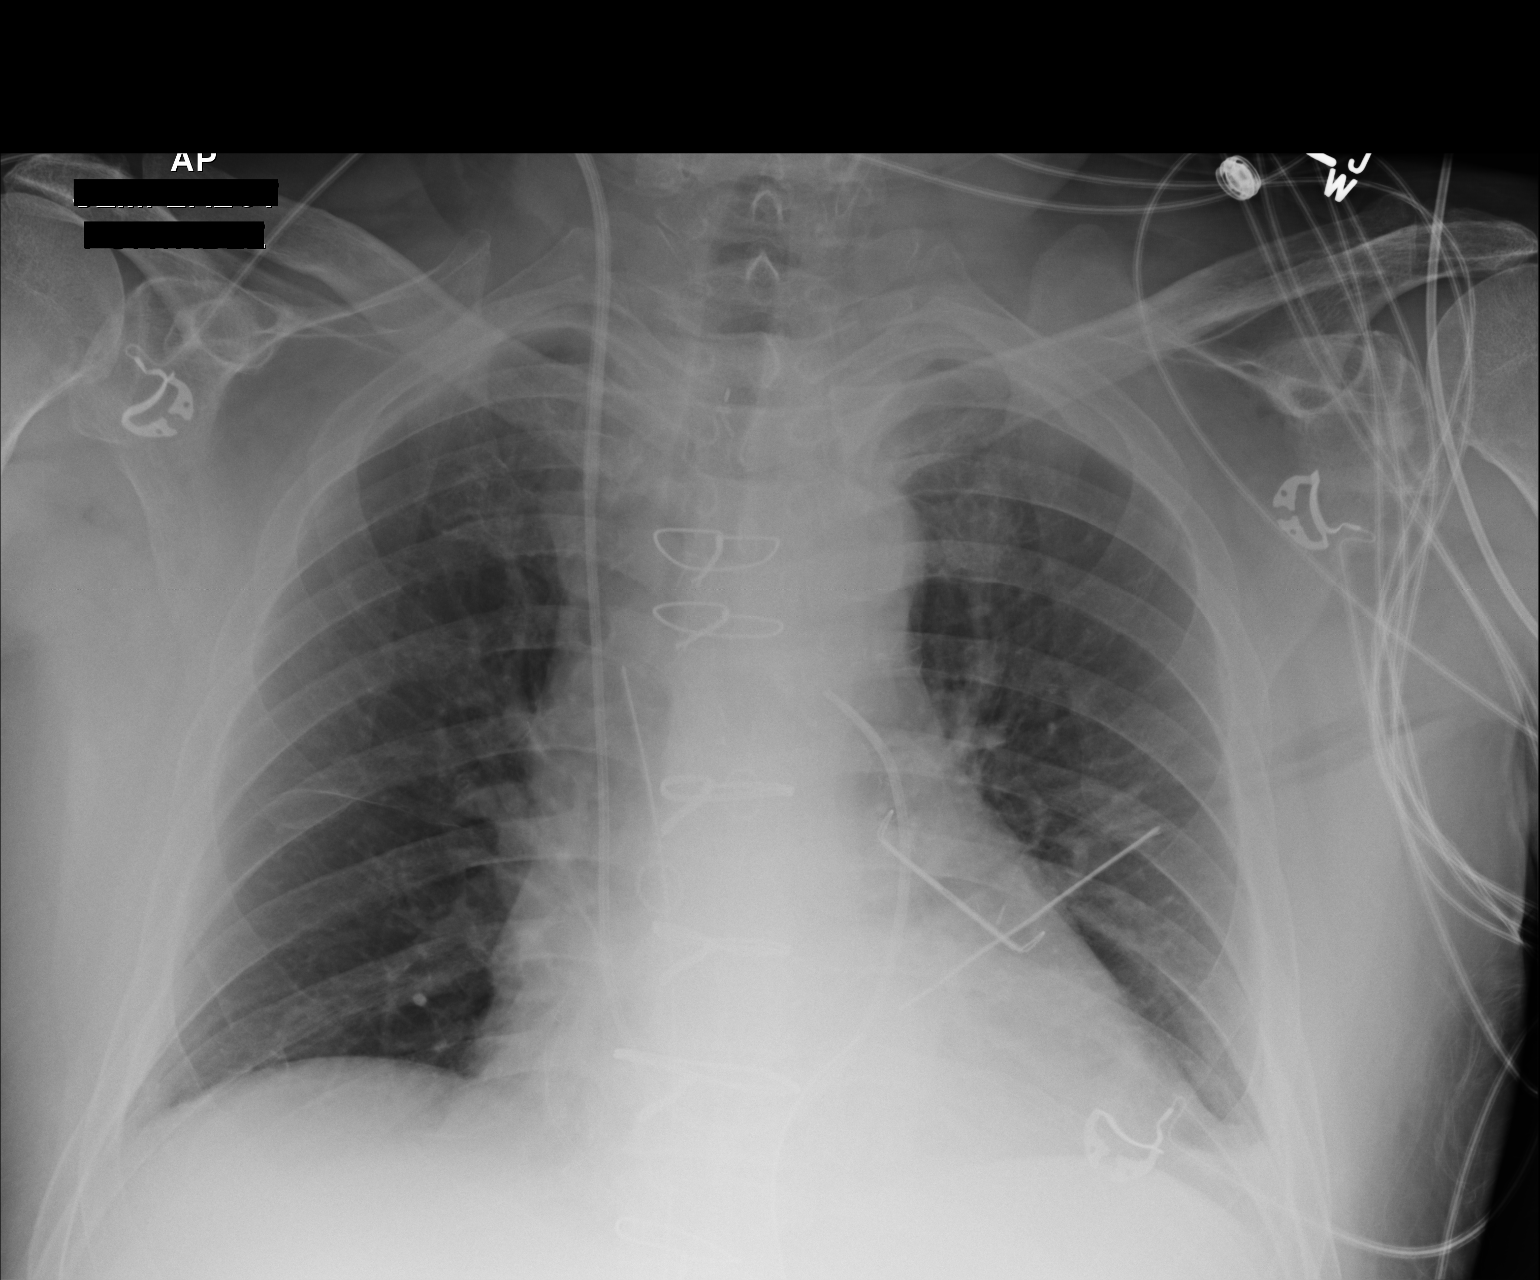

[1 of 1 positions shown; findings below may reference images not displayed]

FINDINGS: Endotracheal tube and nasogastric tube have been removed.
Mediastinal drain and left chest tube remain. Swan-Ganz catheter tip
is in the main pulmonary outflow tract. No pneumothorax. There is
patchy consolidation in the left lower lobe. The lungs are otherwise
clear. The heart is borderline enlarged with pulmonary vascular
within normal limits. No adenopathy.
IMPRESSION: Tube and catheter positions as described without pneumothorax.
Patchy consolidation left base. Lungs otherwise clear. No change in
cardiac silhouette.

## 2015-07-02 IMAGING — CR DG CHEST 2V
2 series · 2 of 2 positions shown · non-contrast
Comparison: 06/24/2014

CLINICAL DATA: Four days postop from CABG. Cough and shortness of
breath.

EXAM:
CHEST  2 VIEW

[w chest pa]
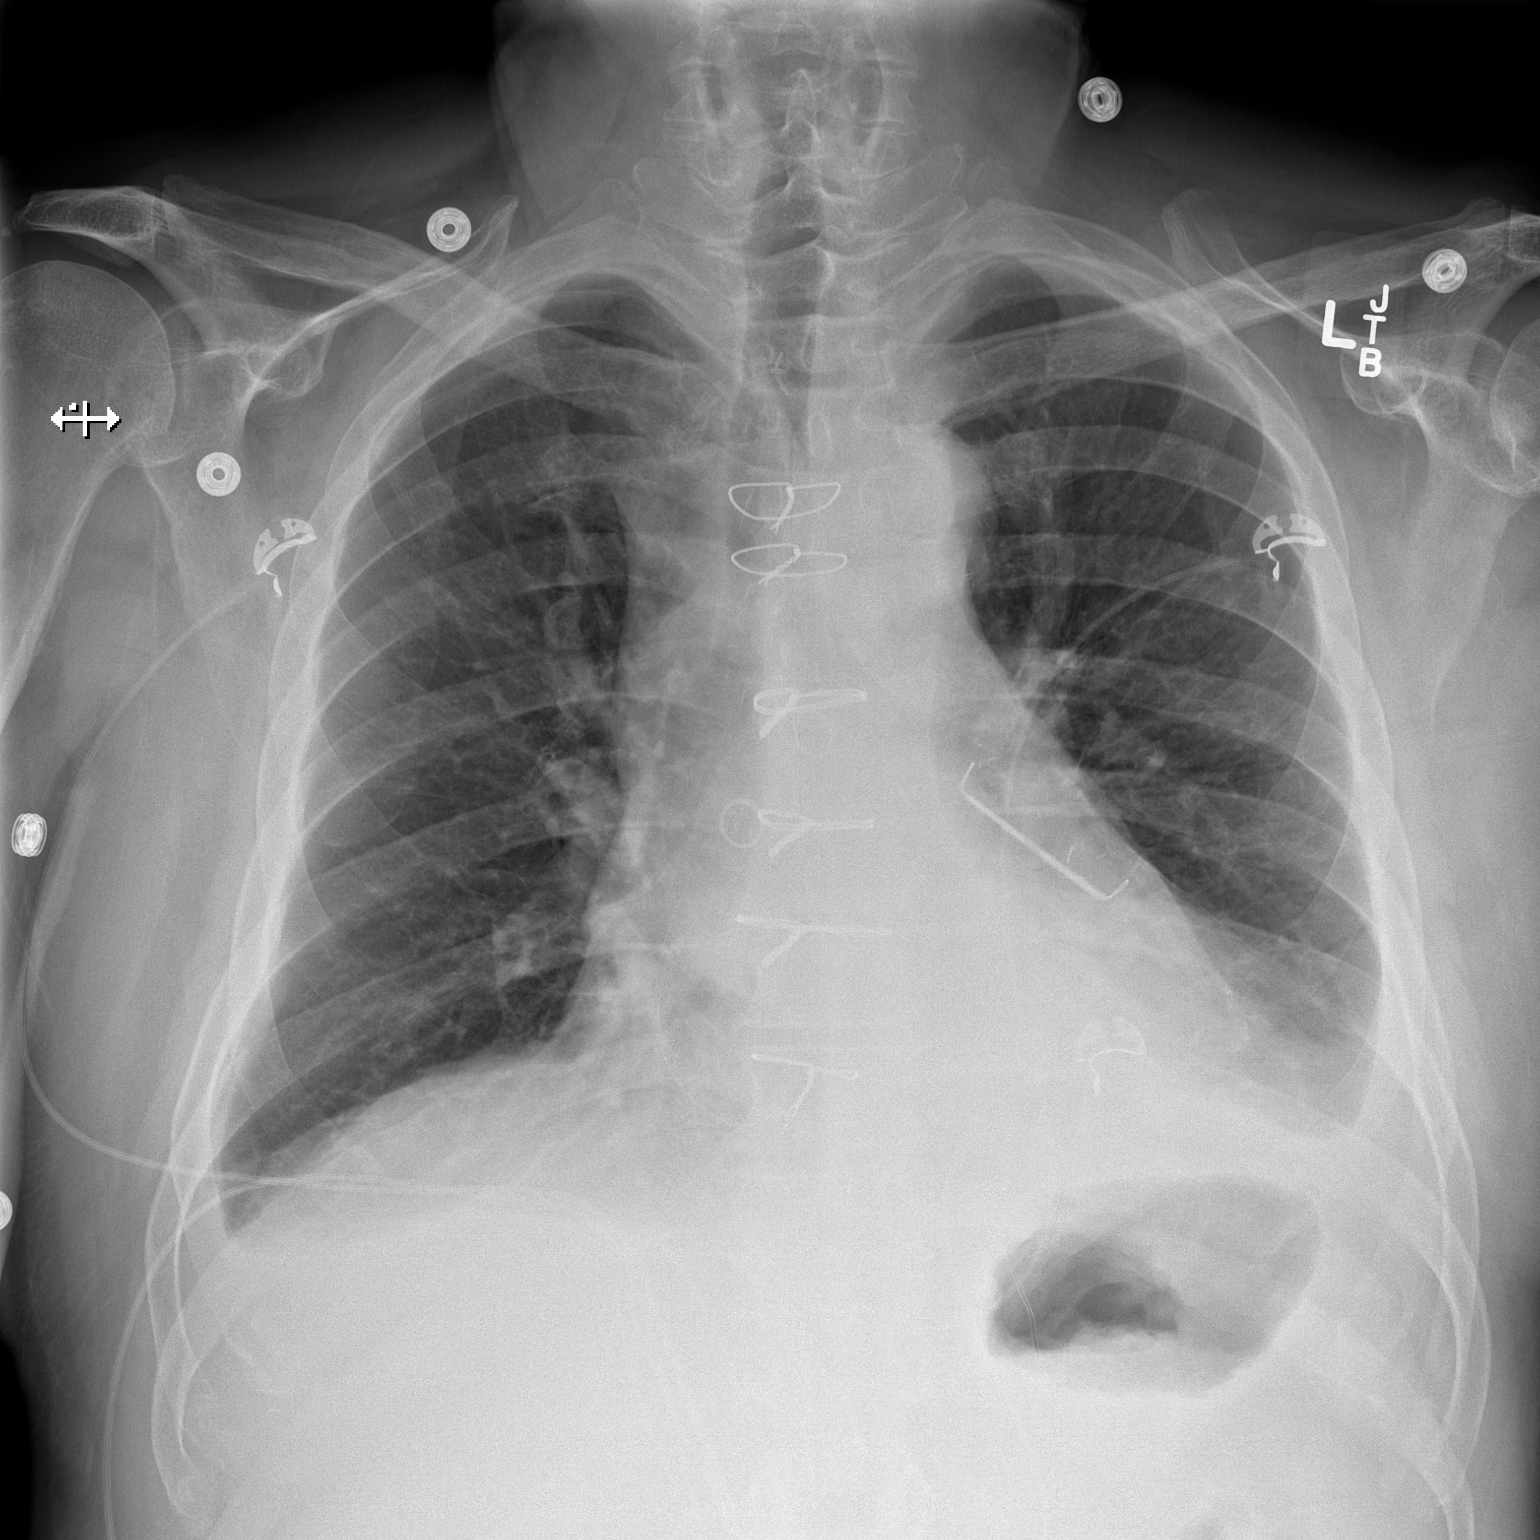

[w chest lat]
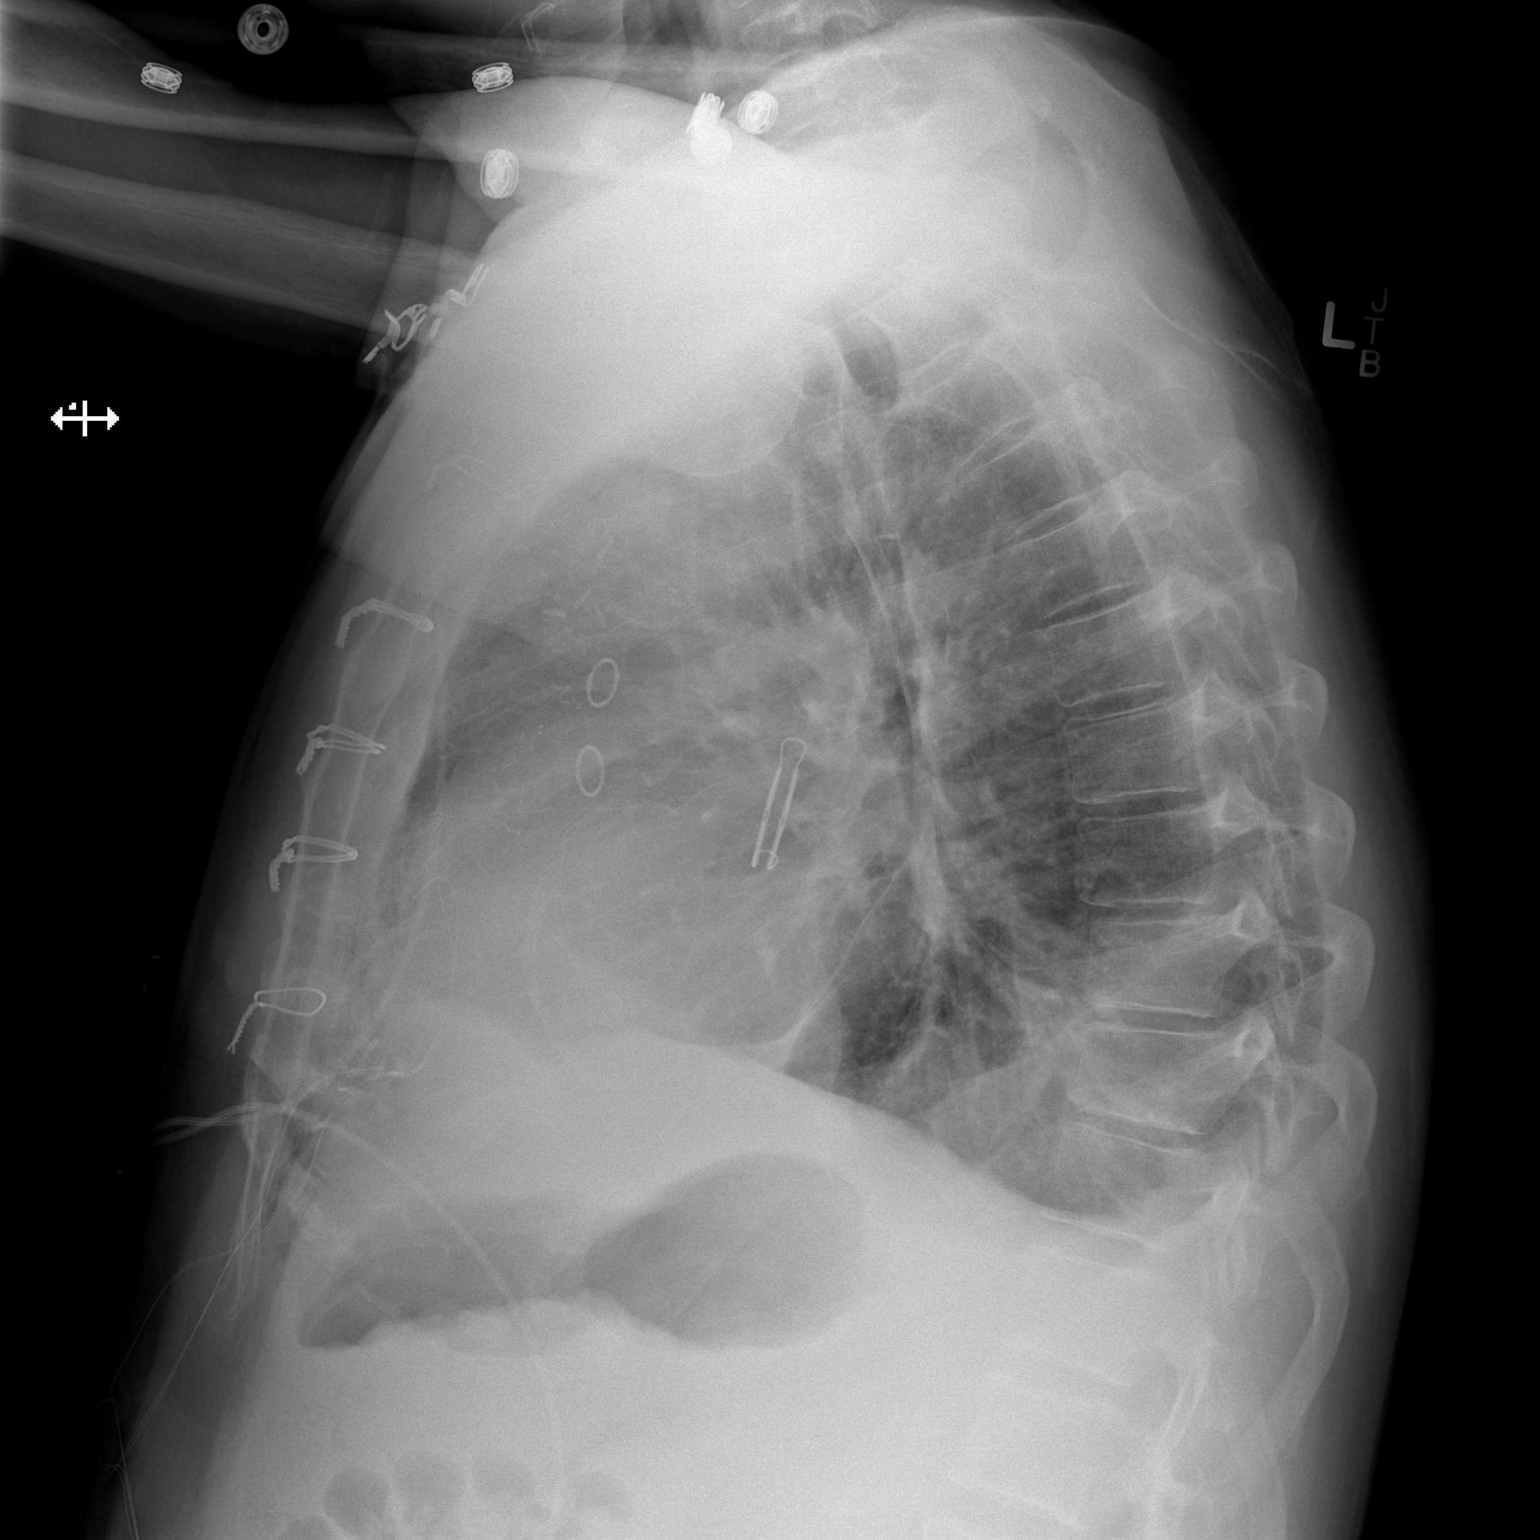

[2 of 2 positions shown; findings below may reference images not displayed]

FINDINGS: Persistent small bilateral pleural effusions are seen as well as
atelectasis in the left lower lobe. Mild cardiomegaly stable. No
evidence of congestive heart failure. No pneumothorax visualized.
IMPRESSION: Small bilateral pleural effusions and left lower lobe atelectasis.

## 2015-08-02 ENCOUNTER — Telehealth: Payer: Self-pay | Admitting: Interventional Cardiology

## 2015-08-02 ENCOUNTER — Observation Stay (HOSPITAL_COMMUNITY)
Admission: EM | Admit: 2015-08-02 | Discharge: 2015-08-04 | Disposition: A | Discharge status: Home / Self Care | Payer: Medicare Other | Attending: Interventional Cardiology | Admitting: Interventional Cardiology

## 2015-08-02 ENCOUNTER — Emergency Department (HOSPITAL_COMMUNITY): Payer: Medicare Other

## 2015-08-02 ENCOUNTER — Encounter (HOSPITAL_COMMUNITY): Payer: Self-pay | Admitting: Nurse Practitioner

## 2015-08-02 DIAGNOSIS — Z7902 Long term (current) use of antithrombotics/antiplatelets: Secondary | ICD-10-CM | POA: Diagnosis not present

## 2015-08-02 DIAGNOSIS — I200 Unstable angina: Secondary | ICD-10-CM | POA: Diagnosis not present

## 2015-08-02 DIAGNOSIS — R079 Chest pain, unspecified: Secondary | ICD-10-CM | POA: Diagnosis not present

## 2015-08-02 DIAGNOSIS — I25110 Atherosclerotic heart disease of native coronary artery with unstable angina pectoris: Secondary | ICD-10-CM | POA: Insufficient documentation

## 2015-08-02 DIAGNOSIS — Z7901 Long term (current) use of anticoagulants: Secondary | ICD-10-CM | POA: Insufficient documentation

## 2015-08-02 DIAGNOSIS — I252 Old myocardial infarction: Secondary | ICD-10-CM | POA: Diagnosis not present

## 2015-08-02 DIAGNOSIS — I10 Essential (primary) hypertension: Secondary | ICD-10-CM | POA: Diagnosis present

## 2015-08-02 DIAGNOSIS — I25118 Atherosclerotic heart disease of native coronary artery with other forms of angina pectoris: Secondary | ICD-10-CM | POA: Diagnosis present

## 2015-08-02 DIAGNOSIS — Z7982 Long term (current) use of aspirin: Secondary | ICD-10-CM | POA: Diagnosis not present

## 2015-08-02 DIAGNOSIS — E119 Type 2 diabetes mellitus without complications: Secondary | ICD-10-CM | POA: Diagnosis not present

## 2015-08-02 DIAGNOSIS — Z79899 Other long term (current) drug therapy: Secondary | ICD-10-CM | POA: Diagnosis not present

## 2015-08-02 DIAGNOSIS — I214 Non-ST elevation (NSTEMI) myocardial infarction: Secondary | ICD-10-CM

## 2015-08-02 DIAGNOSIS — N179 Acute kidney failure, unspecified: Secondary | ICD-10-CM | POA: Diagnosis not present

## 2015-08-02 DIAGNOSIS — I255 Ischemic cardiomyopathy: Secondary | ICD-10-CM | POA: Diagnosis not present

## 2015-08-02 DIAGNOSIS — I480 Paroxysmal atrial fibrillation: Secondary | ICD-10-CM | POA: Diagnosis not present

## 2015-08-02 DIAGNOSIS — E785 Hyperlipidemia, unspecified: Secondary | ICD-10-CM | POA: Diagnosis present

## 2015-08-02 DIAGNOSIS — I2510 Atherosclerotic heart disease of native coronary artery without angina pectoris: Secondary | ICD-10-CM | POA: Diagnosis present

## 2015-08-02 DIAGNOSIS — I4891 Unspecified atrial fibrillation: Secondary | ICD-10-CM | POA: Diagnosis present

## 2015-08-02 DIAGNOSIS — N289 Disorder of kidney and ureter, unspecified: Secondary | ICD-10-CM

## 2015-08-02 DIAGNOSIS — Z955 Presence of coronary angioplasty implant and graft: Secondary | ICD-10-CM | POA: Insufficient documentation

## 2015-08-02 HISTORY — DX: Acute myocardial infarction, unspecified: I21.9

## 2015-08-02 HISTORY — DX: Other chronic pain: G89.29

## 2015-08-02 HISTORY — DX: Cardiac murmur, unspecified: R01.1

## 2015-08-02 HISTORY — DX: Low back pain: M54.5

## 2015-08-02 HISTORY — DX: Low back pain, unspecified: M54.50

## 2015-08-02 HISTORY — DX: Non-ST elevation (NSTEMI) myocardial infarction: I21.4

## 2015-08-02 HISTORY — DX: Type 2 diabetes mellitus without complications: E11.9

## 2015-08-02 LAB — CBC
HCT: 42.3 % (ref 39.0–52.0)
Hemoglobin: 13.9 g/dL (ref 13.0–17.0)
MCH: 28.9 pg (ref 26.0–34.0)
MCHC: 32.9 g/dL (ref 30.0–36.0)
MCV: 87.9 fL (ref 78.0–100.0)
PLATELETS: 195 10*3/uL (ref 150–400)
RBC: 4.81 MIL/uL (ref 4.22–5.81)
RDW: 13.9 % (ref 11.5–15.5)
WBC: 6.5 10*3/uL (ref 4.0–10.5)

## 2015-08-02 LAB — BASIC METABOLIC PANEL
Anion gap: 11 (ref 5–15)
BUN: 16 mg/dL (ref 6–20)
CALCIUM: 9.8 mg/dL (ref 8.9–10.3)
CO2: 19 mmol/L — AB (ref 22–32)
CREATININE: 1.3 mg/dL — AB (ref 0.61–1.24)
Chloride: 103 mmol/L (ref 101–111)
GFR calc non Af Amer: 54 mL/min — ABNORMAL LOW (ref >60)
Glucose, Bld: 361 mg/dL — ABNORMAL HIGH (ref 65–99)
Potassium: 4.3 mmol/L (ref 3.5–5.1)
SODIUM: 133 mmol/L — AB (ref 135–145)

## 2015-08-02 LAB — I-STAT TROPONIN, ED: TROPONIN I, POC: 0.22 ng/mL — AB (ref 0.00–0.08)

## 2015-08-02 LAB — HEPARIN LEVEL (UNFRACTIONATED): Heparin Unfractionated: 0.27 [IU]/mL — ABNORMAL LOW (ref 0.30–0.70)

## 2015-08-02 LAB — APTT: APTT: 30 s (ref 24–36)

## 2015-08-02 LAB — TROPONIN I: Troponin I: 0.99 ng/mL (ref <0.03)

## 2015-08-02 MED ORDER — NITROGLYCERIN 0.4 MG SL SUBL: 0.4000 mg | SUBLINGUAL_TABLET | SUBLINGUAL | Status: DC | PRN

## 2015-08-02 MED ORDER — SODIUM CHLORIDE 0.9 % WEIGHT BASED INFUSION
1.0000 mL/kg/h | INTRAVENOUS | Status: DC
  Administered 2015-08-03: 1 mL/kg/h via INTRAVENOUS

## 2015-08-02 MED ORDER — NITROGLYCERIN 2 % TD OINT
1.0000 [in_us] | TOPICAL_OINTMENT | Freq: Once | TRANSDERMAL | Status: AC
  Administered 2015-08-02: 1 [in_us] via TOPICAL
  Filled 2015-08-02: qty 1

## 2015-08-02 MED ORDER — FLUTICASONE PROPIONATE 50 MCG/ACT NA SUSP
1.0000 | NASAL | Status: DC | PRN
  Filled 2015-08-02: qty 16

## 2015-08-02 MED ORDER — ROSUVASTATIN CALCIUM 10 MG PO TABS: 10.0000 mg | ORAL_TABLET | ORAL | Status: DC

## 2015-08-02 MED ORDER — DICYCLOMINE HCL 20 MG PO TABS
20.0000 mg | ORAL_TABLET | Freq: Three times a day (TID) | ORAL | Status: DC
  Administered 2015-08-02 – 2015-08-04 (×7): 20 mg via ORAL
  Filled 2015-08-02 (×9): qty 1

## 2015-08-02 MED ORDER — METOPROLOL TARTRATE 25 MG PO TABS
25.0000 mg | ORAL_TABLET | Freq: Two times a day (BID) | ORAL | Status: DC
  Administered 2015-08-02 – 2015-08-03 (×3): 25 mg via ORAL
  Filled 2015-08-02 (×4): qty 1

## 2015-08-02 MED ORDER — ACETAMINOPHEN 325 MG PO TABS: 650.0000 mg | ORAL_TABLET | ORAL | Status: DC | PRN

## 2015-08-02 MED ORDER — SODIUM CHLORIDE 0.9% FLUSH
3.0000 mL | Freq: Two times a day (BID) | INTRAVENOUS | Status: DC
Start: 2015-08-02 — End: 2015-08-03
  Administered 2015-08-03: 3 mL via INTRAVENOUS

## 2015-08-02 MED ORDER — SODIUM CHLORIDE 0.9 % WEIGHT BASED INFUSION
3.0000 mL/kg/h | INTRAVENOUS | Status: DC
  Administered 2015-08-03: 3 mL/kg/h via INTRAVENOUS

## 2015-08-02 MED ORDER — SODIUM CHLORIDE 0.9 % IV SOLN: 250.0000 mL | INTRAVENOUS | Status: DC | PRN

## 2015-08-02 MED ORDER — FLUOROMETHOLONE 0.1 % OP SUSP
1.0000 [drp] | Freq: Every day | OPHTHALMIC | Status: DC | PRN
Start: 2015-08-03 — End: 2015-08-04
  Filled 2015-08-02: qty 5

## 2015-08-02 MED ORDER — HEPARIN (PORCINE) IN NACL 100-0.45 UNIT/ML-% IJ SOLN
1600.0000 [IU]/h | INTRAMUSCULAR | Status: DC
  Administered 2015-08-02: 1300 [IU]/h via INTRAVENOUS
  Administered 2015-08-03: 1600 [IU]/h via INTRAVENOUS
  Filled 2015-08-02 (×2): qty 250

## 2015-08-02 MED ORDER — ASPIRIN 81 MG PO CHEW
324.0000 mg | CHEWABLE_TABLET | Freq: Once | ORAL | Status: AC
  Administered 2015-08-02: 324 mg via ORAL
  Filled 2015-08-02: qty 4

## 2015-08-02 MED ORDER — ASPIRIN 81 MG PO CHEW
81.0000 mg | CHEWABLE_TABLET | ORAL | Status: AC
  Administered 2015-08-03: 81 mg via ORAL
  Filled 2015-08-02: qty 1

## 2015-08-02 MED ORDER — ONDANSETRON HCL 4 MG/2ML IJ SOLN: 4.0000 mg | Freq: Four times a day (QID) | INTRAMUSCULAR | Status: DC | PRN

## 2015-08-02 MED ORDER — ASPIRIN EC 81 MG PO TBEC
81.0000 mg | DELAYED_RELEASE_TABLET | Freq: Every day | ORAL | Status: DC
  Administered 2015-08-02 – 2015-08-04 (×2): 81 mg via ORAL
  Filled 2015-08-02 (×2): qty 1

## 2015-08-02 MED ORDER — SODIUM CHLORIDE 0.9% FLUSH: 3.0000 mL | INTRAVENOUS | Status: DC | PRN

## 2015-08-02 MED ORDER — TAMSULOSIN HCL 0.4 MG PO CAPS: 0.4000 mg | ORAL_CAPSULE | ORAL | Status: DC | PRN

## 2015-08-02 NOTE — ED Notes (Signed)
Attempted to call report at this time. Told nurse will call back for report.

## 2015-08-02 NOTE — ED Notes (Signed)
Called report to receiving RN at this time

## 2015-08-02 NOTE — Telephone Encounter (Signed)
Spoke with pt. He reports he has been having shortness of breath and chest pain for the last week with walking about 70 feet. He is currently having chest soreness which is the same as the pain he has been having with walking. He thinks he might need to go to hospital for possible catheterization.  I told pt since he is currently experiencing chest soreness he should call 911 to be transported to hospital. Pt agreeable with this plan.

## 2015-08-02 NOTE — Progress Notes (Signed)
ANTICOAGULATION CONSULT NOTE - Initial Consult  Pharmacy Consult for Heparin Indication: atrial fibrillation  Allergies  Allergen Reactions  . Lipitor [Atorvastatin] Other (See Comments)    Myopathy Spring 2006  . Pravastatin Other (See Comments)    LEG CRAMPS  . Simvastatin Other (See Comments)    LEG CRAMPS    Patient Measurements:   Heparin Dosing Weight: 85 kg  Vital Signs: Temp: 98.2 F (36.8 C) (07/31 1432) Temp Source: Oral (07/31 1432) BP: 148/89 (07/31 1545) Pulse Rate: 72 (07/31 1545)  Labs:  Recent Labs  08/02/15 1420  HGB 13.9  HCT 42.3  PLT 195  CREATININE 1.30*    CrCl cannot be calculated (Unknown ideal weight.).   Medical History: Past Medical History:  Diagnosis Date  . Arthritis    Right shoulder  . Atrial fibrillation (HCC)    Paroxysmal, rare episodes, sinus rhythm on flecainide, patient prefers not to take Coumadin  . Bradycardia    April, 2013  . Chest pain    Nuclear, 2006, no ischemia  . Coronary artery disease    s/p staged cath 05/12/2014 and 5/11, DES x 2 to heavily calcified RCA, residual with 60% prox LAD, 70% D1  . Diabetes mellitus without complication ([place])    type 2  . Ejection fraction    EF 55-60%,  echo, January, 2011  . Elevated CPK    CPK elevated with normal MB and normal troponin the past  . History of echocardiogram    Echo 8/16: EF 55%, normal wall motion, grade 2 diastolic dysfunction, trivial MR, normal RV function, PASP 27 mmHg  . Hypertension   . IBS (irritable bowel syndrome)   . Sinus drainage     Medications:   (Not in a hospital admission) Scheduled:  . nitroGLYCERIN  1 inch Topical Once   Infusions:    Assessment: 70yo male with history of Afib on apixaban PTA presents with SOB and CP. Pharmacy is consulted to dose heparin for atrial fibrillation.   Last apixaban dose was taken 7/31 at 0630. Will start heparin 12h after.  Goal of Therapy:  Heparin level 0.3-0.7 units/ml aPTT 66-102  seconds Monitor platelets by anticoagulation protocol: Yes   Plan:  Start heparin infusion at 1300 units/hr Check anti-Xa level in 8 hours and daily while on heparin Continue to monitor H&H and platelets  Andrey Cota. Diona Foley, PharmD, [place] [facility] Clinical Pharmacist Pager [phone] 08/02/2015,4:06 PM

## 2015-08-02 NOTE — ED Triage Notes (Signed)
Pt c/o approximately 1 week history of chest tightness, sob, diaphoresis with exertion. Symptoms are relieved by rest. He is alert with no symptoms now

## 2015-08-02 NOTE — Progress Notes (Signed)
Critical lab--Troponin 0.99 received from Shawn 22:06.  Call back from [place] at 22:27 acknowledging the page.

## 2015-08-02 NOTE — Telephone Encounter (Signed)
Pt c/o Shortness Of Breath: STAT if SOB developed within the last 24 hours or pt is noticeably SOB on the phone  1. Are you currently SOB (can you hear that pt is SOB on the phone)? No, just when walking 2. How long have you been experiencing SOB? About a week ago 3. Are you SOB when sitting or when up moving around? Moving around only-mostly on incline 4.  Are you currently experiencing any other symptoms? Fatigue-Sweating-lightheaded and dizzy   pls advise [phone]

## 2015-08-02 NOTE — H&P (Signed)
Patient ID: [name] MRN: [id number], DOB/AGE: Feb 03, 1945   Admit date: 08/02/2015   Primary Physician: Leandrew Koyanagi, MD Primary Cardiologist: Dr. Irish Lack Electrophysiologist: Dr. Rayann Heman   Pt. Profile:  [name] is a 70 yo male with PMH of HTN, HLD, DM II, PAF on eliquis s/p MAZE and LA clip, and CAD s/p CABG x 5 LIMA to LAD, SVG to diag/ramus, SVG to PDA/PLA in June 2016 presented with 4 days onset of progressive exertional angina  Problem List  Past Medical History:  Diagnosis Date  . Arthritis    Right shoulder  . Atrial fibrillation (HCC)    Paroxysmal, rare episodes, sinus rhythm on flecainide, patient prefers not to take Coumadin  . Bradycardia    April, 2013  . Chest pain    Nuclear, 2006, no ischemia  . Coronary artery disease    s/p staged cath 05/12/2014 and 5/11, DES x 2 to heavily calcified RCA, residual with 60% prox LAD, 70% D1  . Diabetes mellitus without complication ([place])    type 2  . Ejection fraction    EF 55-60%,  echo, January, 2011  . Elevated CPK    CPK elevated with normal MB and normal troponin the past  . History of echocardiogram    Echo 8/16: EF 55%, normal wall motion, grade 2 diastolic dysfunction, trivial MR, normal RV function, PASP 27 mmHg  . Hypertension   . IBS (irritable bowel syndrome)   . Sinus drainage     Past Surgical History:  Procedure Laterality Date  . APPENDECTOMY    . CARDIAC CATHETERIZATION N/A 05/12/2014   Procedure: Left Heart Cath and Coronary Angiography;  Surgeon: Troy Sine, MD;  Location: [facility];  Service: Cardiovascular;  Laterality: N/A;  . CARDIAC CATHETERIZATION N/A 05/13/2014   Procedure: Coronary/Graft Atherectomy;  Surgeon: Troy Sine, MD;  Location: [facility];  Service: Cardiovascular;  Laterality: N/A;  . CARDIAC CATHETERIZATION Right 05/13/2014   Procedure: Temporary Pacemaker;  Surgeon: Troy Sine, MD;  Location: [place];  Service: Cardiovascular;   Laterality: Right;  . CARDIAC CATHETERIZATION N/A 05/13/2014   Procedure: Coronary Stent Intervention;  Surgeon: Troy Sine, MD;  Location: [facility];  Service: Cardiovascular;  Laterality: N/A;  . CARDIAC CATHETERIZATION N/A 06/18/2014   Procedure: Left Heart Cath and Coronary Angiography;  Surgeon: Peter M Martinique, MD;  Location: [place] [facility];  Service: Cardiovascular;  Laterality: N/A;  . CARDIOVASCULAR STRESS TEST  05/06/2014  . CATARACT EXTRACTION    . CHOLECYSTECTOMY    . CORONARY ARTERY BYPASS GRAFT N/A 06/22/2014   Procedure: CORONARY ARTERY BYPASS GRAFTING (CABG) x five, using left internal mammary artery, and right leg greater saphenous vein harvested endoscopically;  Surgeon: Melrose Nakayama, MD;  Location: [facility];  Service: Open Heart Surgery;  Laterality: N/A;  . CORONARY STENT PLACEMENT    . MAZE N/A 06/22/2014   Procedure: MAZE;  Surgeon: Melrose Nakayama, MD;  Location: [facility];  Service: Open Heart Surgery;  Laterality: N/A;  . TEE WITHOUT CARDIOVERSION N/A 06/22/2014   Procedure: TRANSESOPHAGEAL ECHOCARDIOGRAM (TEE);  Surgeon: Melrose Nakayama, MD;  Location: [facility];  Service: Open Heart Surgery;  Laterality: N/A;     Allergies  Allergies  Allergen Reactions  . Lipitor [Atorvastatin] Other (See Comments)    Myopathy Spring 2006  . Pravastatin Other (See Comments)    LEG CRAMPS  . Simvastatin Other (See Comments)  LEG CRAMPS    HPI  [name] is a 70 yo male with PMH of HTN, HLD, DM II, PAF on eliquis s/p MAZE and LAA clip, and CAD s/p CABG x 5 LIMA to LAD, SVG to diag/ramus, SVG to PDA/PLA. Patient underwent 5 vessel CABG in June 2016. Since then, he has been doing relatively well. He does complain of bilateral lower extremity numbness which he says has been persistent since the bypass surgery on both side of the leg. He only had vein harvesting from the right lower extremity. His last office visit with Dr. Irish Lack was in February 2017, at  which time he asked Dr. Irish Lack regarding stop the eliquis. Of note, patient did have LAA clip and MAZE during CABG. Dr. Irish Lack referred the patient to Dr. Rayann Heman who saw the patient on 03/24/2015, various option has been discussed with patient including: 1. resuming eliquis, 2. stopping eliquis, 3. test A. fib burden with loop recorder before stopping eliquis, and 4. potentially obtain TEE to ensure adequate closure of left atrial appendage clipping before stopping eliquis. Patient opted to continue eliquis since then.  He has been compliant with his medication. He was doing well until last Thursday. He was playing golf last Thursday when he started having substernal chest soreness while walking on the field. He also had diaphoresis and shortness of breath with exertion. The symptom resolved after he stopped walking. On Friday while walking on the beach with his wife, he has similar symptom. This morning he went back to play golf again, however had to stop after he had 3 episode of chest pain each occurred while walking up an incline. Since he had the chest pain on the golf course, he continued to have chest soreness since then. Due to the frequency of exertional symptom, he sought medical attention at [facility] ED. Interestingly, he said prior to the previous bypass surgery his anginal symptom was sharp stabbing pain.  Initial laboratory finding was significant for sodium 133, creatinine 1.3. Troponin 0.22. Chest x-ray was negative for acute process. EKG showed Nonspecific ST-T wave changes. Cardiology has been consulted for exertional chest pain.      Home Medications  Prior to Admission medications   Medication Sig Start Date End Date Taking? Authorizing Provider  apixaban (ELIQUIS) 5 MG TABS tablet Take 1 tablet (5 mg total) by mouth 2 (two) times daily. 06/16/15  Yes Jettie Booze, MD  aspirin 81 MG tablet Take 81 mg by mouth daily.   Yes Historical Provider, MD  dicyclomine (BENTYL) 20  MG tablet Take 1 tablet (20 mg total) by mouth 4 (four) times daily -  before meals and at bedtime. Patient taking differently: Take 20 mg by mouth 2 (two) times daily.  08/04/14  Yes Leandrew Koyanagi, MD  fluorometholone (FML) 0.1 % ophthalmic suspension Place 1 drop into the right eye daily as needed (for eye redness and itching).  06/04/15  Yes Historical Provider, MD  fluticasone (FLONASE) 50 MCG/ACT nasal spray Place 1 spray into both nostrils as needed for allergies or rhinitis. 03/10/15  Yes Leandrew Koyanagi, MD  glyBURIDE-metformin (GLUCOVANCE) 2.5-500 MG tablet Take 1 tablet by mouth 2 (two) times daily with a meal. 12/11/14  Yes Leandrew Koyanagi, MD  ibuprofen (ADVIL,MOTRIN) 200 MG tablet Take 400 mg by mouth every morning.   Yes Historical Provider, MD  MAGNESIUM PO Take 1 tablet by mouth daily as needed (for leg cramping).    Yes Historical Provider, MD  metoprolol  tartrate (LOPRESSOR) 25 MG tablet Take 1 tablet (25 mg total) by mouth 2 (two) times daily. Patient taking differently: Take 25 mg by mouth every Monday, Wednesday, and Friday.  02/25/15  Yes Jettie Booze, MD  nitroGLYCERIN (NITROSTAT) 0.4 MG SL tablet Place 1 tablet (0.4 mg total) under the tongue every 5 (five) minutes as needed for chest pain. 02/25/15  Yes Jettie Booze, MD  tamsulosin (FLOMAX) 0.4 MG CAPS capsule Take 0.4 mg by mouth as needed. For urination problems   Yes Historical Provider, MD  TRULICITY [id number] SOPN Inject 0.75 mg as directed every Wednesday.  10/29/14  Yes Historical Provider, MD  rosuvastatin (CRESTOR) 10 MG tablet Take 1 tablet (10 mg total) by mouth every Monday, Wednesday, Friday, and Saturday at 6 PM. Patient not taking: Reported on 08/02/2015 08/28/14   [name], MD    Family History  Family History  Problem Relation Age of Onset  . Alzheimer's disease Mother   . Emphysema Father   . Cancer Brother   . Arrhythmia Sister   . Heart attack Sister   . Heart disease  Sister   . Hyperlipidemia Sister   . Hypertension Sister     Social History  Social History   Social History  . Marital status: Married    Spouse name: N/A  . Number of children: N/A  . Years of education: N/A   Occupational History  . Not on file.   Social History Main Topics  . Smoking status: Never Smoker  . Smokeless tobacco: Never Used  . Alcohol use No  . Drug use: No  . Sexual activity: Not on file   Other Topics Concern  . Not on file   Social History Narrative  . No narrative on file     Review of Systems General:  No chills, fever, night sweats or weight changes.  Cardiovascular:  No dyspnea on exertion, edema, orthopnea, palpitations, paroxysmal nocturnal dyspnea. +chest pain, diaphoresis, SOB and dizziness Dermatological: No rash, lesions/masses Respiratory: No cough  +dyspnea Urologic: No hematuria, dysuria Abdominal:   No nausea, vomiting, diarrhea, bright red blood per rectum, melena, or hematemesis Neurologic:  No visual changes, wkns, changes in mental status. All other systems reviewed and are otherwise negative except as noted above.  Physical Exam  Blood pressure 148/96, pulse 69, temperature 98.2 F (36.8 C), temperature source Oral, resp. rate 22, SpO2 100 %.  General: Pleasant, NAD Psych: Normal affect. Neuro: Alert and oriented X 3. Moves all extremities spontaneously. HEENT: Normal  Neck: Supple without bruits or JVD. Lungs:  Resp regular and unlabored, CTA. Heart: RRR no s3, s4, or murmurs. Abdomen: Soft, non-tender, non-distended, BS + x 4.  Extremities: No clubbing, cyanosis or edema. DP/PT/Radials 2+ and equal bilaterally.  Labs  Troponin [facility] of [facility] Test)  Recent Labs  08/02/15 1508  TROPIPOC 0.22*   No results for input(s): CKTOTAL, CKMB, TROPONINI in the last 72 hours. Lab Results  Component Value Date   WBC 6.5 08/02/2015   HGB 13.9 08/02/2015   HCT 42.3 08/02/2015   MCV 87.9 08/02/2015   PLT 195 08/02/2015     Recent Labs Lab 08/02/15 1420  NA 133*  K 4.3  CL 103  CO2 19*  BUN 16  CREATININE 1.30*  CALCIUM 9.8  GLUCOSE 361*   Lab Results  Component Value Date   CHOL 143 03/10/2015   HDL 36 (L) 03/10/2015   LDLCALC 77 03/10/2015   TRIG 152 (H) 03/10/2015  No results found for: DDIMER   Radiology/Studies  Dg Chest 2 View  Result Date: 08/02/2015 CLINICAL DATA:  Chest pain EXAM: CHEST  2 VIEW COMPARISON:  July 28, 2014 FINDINGS: The patient is status post CABG/sternotomy. The heart, hila, mediastinum, lungs, and pleura are unchanged with no acute abnormalities. IMPRESSION: No active cardiopulmonary disease. Electronically Signed   By: Dorise Bullion III M.D   On: 08/02/2015 15:42    ECG  NSR without specific ST-T wave changes  Echocardiogram 08/17/2014  LV EF: 55%  ------------------------------------------------------------------- Indications:      Cardiomyopathy - ischemic 414.8.  ------------------------------------------------------------------- History:   PMH:   Atrial fibrillation.  Coronary artery disease.   ------------------------------------------------------------------- Study Conclusions  - Left ventricle: The cavity size was normal. Wall thickness was   normal. The estimated ejection fraction was 55%. Wall motion was   normal; there were no regional wall motion abnormalities.   Features are consistent with a pseudonormal left ventricular   filling pattern, with concomitant abnormal relaxation and   increased filling pressure (grade 2 diastolic dysfunction). - Aortic valve: There was no stenosis. - Mitral valve: There was trivial regurgitation. - Right ventricle: The cavity size was normal. Systolic function   was normal. - Tricuspid valve: Peak RV-RA gradient (S): 24 mm Hg. - Pulmonary arteries: PA peak pressure: 27 mm Hg (S). - Inferior vena cava: The vessel was normal in size. The   respirophasic diameter changes were in the normal range (=  50%),   consistent with normal central venous pressure.  Impressions:  - Normal LV size with EF 55%. Moderate diastolic dysfunction.   Normal RV size and systolic function. No significant valvular   abnormalities.     ASSESSMENT AND PLAN  1. NSTEMI with progressive chest pain concerning for class III angina: Despite his chest pain slightly different from his previous angina, the exertional component is very concerning for unstable angina. First troponin is 0.22. Creatinine is 1.3 whereas his normal creatinine is 0.9. He did take his last dose of eliquis this morning. We'll hold his eliquis, started on IV heparin, will discuss with M.D., potentially proceed with cardiac catheterization tomorrow after holding 2 doses of eliquis from the left radial approach. Trend troponin overnight.  - Risk and benefit of procedure explained to the patient who display clear understanding and agree to proceed. Discussed with patient possible procedural risk include bleeding, vascular injury, renal injury, arrythmia, MI, stroke and loss of limb or life.  2. AKI: Cr 1.3 on admission, 0.8 four month ago, will hydrate overnight  3. CAD s/p CABG x 5 LIMA to LAD, SVG to diag/ramus, SVG to PDA/PLA  4.  PAF on eliquis s/p MAZE and LAA clip  - CHA2DS2-Vasc score 4 (HTN, DM, age, CAD)  - if need DAPT, will need reassess whether he require eliquis on term esp after LAA clip and MAZE.   5. HTN: continue BB, potentially add ACEI 6. HLD: continue crestor 7. DM II: hold oral antihyperglycemic  Signed, Almyra Deforest, PA-C 08/02/2015, 5:05 PM

## 2015-08-02 NOTE — ED Provider Notes (Signed)
[facility] DEPT Provider Note   CSN: [id number] Arrival date & time: 08/02/15  1414  First Provider Contact:  First MD Initiated Contact with Patient 08/02/15 1553        History   Chief Complaint Chief Complaint  Patient presents with  . Shortness of Breath  . Chest Pain    HPI LARENZO CAPLES is a 70 y.o. male.  The history is provided by the patient. No language interpreter was used.  Shortness of Breath  Associated symptoms include chest pain.  Chest Pain   Associated symptoms include shortness of breath.   DANNON NGUYENTHI is a 70 y.o. male who presents to the Emergency Department complaining of chest pain, SOB.  He has a history of 5 vessel CABG 1 year ago. For the last 3 days he has experienced exertional dyspnea and exertional chest pain. When ambulating 70-100 feet he develops extreme fatigue, chest soreness, severe diaphoresis and feeling as if he might pass out. After rest his symptoms improved. Today he was coughing and had 3 episodes. He called his cardiologist who recommended going to the emergency department for further evaluation.  Past Medical History:  Diagnosis Date  . Arthritis    Right shoulder  . Atrial fibrillation (HCC)    Paroxysmal, rare episodes, sinus rhythm on flecainide, patient prefers not to take Coumadin  . Bradycardia    April, 2013  . Chest pain    Nuclear, 2006, no ischemia  . Coronary artery disease    s/p staged cath 05/12/2014 and 5/11, DES x 2 to heavily calcified RCA, residual with 60% prox LAD, 70% D1  . Diabetes mellitus without complication ([place])    type 2  . Ejection fraction    EF 55-60%,  echo, January, 2011  . Elevated CPK    CPK elevated with normal MB and normal troponin the past  . History of echocardiogram    Echo 8/16: EF 55%, normal wall motion, grade 2 diastolic dysfunction, trivial MR, normal RV function, PASP 27 mmHg  . Hypertension   . IBS (irritable bowel syndrome)   . Sinus drainage     Patient Active  Problem List   Diagnosis Date Noted  . Toe pain 02/25/2015  . Insomnia 07/21/2014  . HX: anticoagulation 07/21/2014  . Hx of CABG 07/19/2014  . Nonsustained ventricular tachycardia ([place]) 06/23/2014  . Cardiomyopathy, ischemic   . Sinus bradycardia 05/13/2014  . CAD (coronary artery disease) of artery bypass graft   . Junctional bradycardia   . CAD (coronary artery disease), native coronary artery 05/12/2014  . Hyperlipidemia 04/21/2013  . NASH (nonalcoholic steatohepatitis) 09/06/2011  . Rotator cuff tendinitis 07/28/2011  . Elevated CPK   . Hypertension   . Ejection fraction   . BPH (benign prostatic hyperplasia) 03/11/2011  . IBS (irritable bowel syndrome) 03/11/2011  . Overweight 03/11/2011  . Bursitis of hip 03/11/2011  . DM type 2 (diabetes mellitus, type 2) ([place]) 01/21/2009  . ATRIAL FIBRILLATION, PAROXYSMAL 01/21/2009    Past Surgical History:  Procedure Laterality Date  . APPENDECTOMY    . CARDIAC CATHETERIZATION N/A 05/12/2014   Procedure: Left Heart Cath and Coronary Angiography;  Surgeon: Troy Sine, MD;  Location: [facility];  Service: Cardiovascular;  Laterality: N/A;  . CARDIAC CATHETERIZATION N/A 05/13/2014   Procedure: Coronary/Graft Atherectomy;  Surgeon: Troy Sine, MD;  Location: [place];  Service: Cardiovascular;  Laterality: N/A;  . CARDIAC CATHETERIZATION Right 05/13/2014   Procedure: Temporary Pacemaker;  Surgeon:  Troy Sine, MD;  Location: [place] [facility];  Service: Cardiovascular;  Laterality: Right;  . CARDIAC CATHETERIZATION N/A 05/13/2014   Procedure: Coronary Stent Intervention;  Surgeon: Troy Sine, MD;  Location: [facility];  Service: Cardiovascular;  Laterality: N/A;  . CARDIAC CATHETERIZATION N/A 06/18/2014   Procedure: Left Heart Cath and Coronary Angiography;  Surgeon: Peter M Martinique, MD;  Location: [facility];  Service: Cardiovascular;  Laterality: N/A;  . CARDIOVASCULAR STRESS TEST  05/06/2014    . CATARACT EXTRACTION    . CHOLECYSTECTOMY    . CORONARY ARTERY BYPASS GRAFT N/A 06/22/2014   Procedure: CORONARY ARTERY BYPASS GRAFTING (CABG) x five, using left internal mammary artery, and right leg greater saphenous vein harvested endoscopically;  Surgeon: Melrose Nakayama, MD;  Location: [place];  Service: Open Heart Surgery;  Laterality: N/A;  . CORONARY STENT PLACEMENT    . MAZE N/A 06/22/2014   Procedure: MAZE;  Surgeon: Melrose Nakayama, MD;  Location: [place];  Service: Open Heart Surgery;  Laterality: N/A;  . TEE WITHOUT CARDIOVERSION N/A 06/22/2014   Procedure: TRANSESOPHAGEAL ECHOCARDIOGRAM (TEE);  Surgeon: Melrose Nakayama, MD;  Location: [place];  Service: Open Heart Surgery;  Laterality: N/A;       Home Medications    Prior to Admission medications   Medication Sig Start Date End Date Taking? Authorizing Provider  apixaban (ELIQUIS) 5 MG TABS tablet Take 1 tablet (5 mg total) by mouth 2 (two) times daily. 06/16/15  Yes Jettie Booze, MD  aspirin 81 MG tablet Take 81 mg by mouth daily.   Yes Historical Provider, MD  dicyclomine (BENTYL) 20 MG tablet Take 1 tablet (20 mg total) by mouth 4 (four) times daily -  before meals and at bedtime. Patient taking differently: Take 20 mg by mouth 2 (two) times daily.  08/04/14  Yes Leandrew Koyanagi, MD  fluorometholone (FML) 0.1 % ophthalmic suspension Place 1 drop into the right eye daily as needed (for eye redness and itching).  06/04/15  Yes Historical Provider, MD  fluticasone (FLONASE) 50 MCG/ACT nasal spray Place 1 spray into both nostrils as needed for allergies or rhinitis. 03/10/15  Yes Leandrew Koyanagi, MD  glyBURIDE-metformin (GLUCOVANCE) 2.5-500 MG tablet Take 1 tablet by mouth 2 (two) times daily with a meal. 12/11/14  Yes Leandrew Koyanagi, MD  ibuprofen (ADVIL,MOTRIN) 200 MG tablet Take 400 mg by mouth every morning.   Yes Historical Provider, MD  MAGNESIUM PO Take 1 tablet by mouth daily as needed (for leg  cramping).    Yes Historical Provider, MD  metoprolol tartrate (LOPRESSOR) 25 MG tablet Take 1 tablet (25 mg total) by mouth 2 (two) times daily. 02/25/15  Yes Jettie Booze, MD  nitroGLYCERIN (NITROSTAT) 0.4 MG SL tablet Place 1 tablet (0.4 mg total) under the tongue every 5 (five) minutes as needed for chest pain. 02/25/15  Yes Jettie Booze, MD  rosuvastatin (CRESTOR) 10 MG tablet Take 1 tablet (10 mg total) by mouth every Monday, Wednesday, Friday, and Saturday at 6 PM. 08/28/14  Yes [name], MD  tamsulosin (FLOMAX) 0.4 MG CAPS capsule Take 0.4 mg by mouth as needed. For urination problems   Yes Historical Provider, MD  TRULICITY 2.37 SE/8.3TD SOPN Inject 0.75 mg as directed every Wednesday.  10/29/14  Yes Historical Provider, MD    Family History Family History  Problem Relation Age of Onset  . Alzheimer's disease Mother   . Emphysema  Father   . Cancer Brother   . Arrhythmia Sister   . Heart attack Sister   . Heart disease Sister   . Hyperlipidemia Sister   . Hypertension Sister     Social History Social History  Substance Use Topics  . Smoking status: Never Smoker  . Smokeless tobacco: Never Used  . Alcohol use No     Allergies   Lipitor [atorvastatin]; Pravastatin; and Simvastatin   Review of Systems Review of Systems  Respiratory: Positive for shortness of breath.   Cardiovascular: Positive for chest pain.  All other systems reviewed and are negative.    Physical Exam Updated Vital Signs BP 148/89   Pulse 72   Temp 98.2 F (36.8 C) (Oral)   Resp 21   SpO2 99%   Physical Exam  Constitutional: He is oriented to person, place, and time. He appears well-developed and well-nourished.  HENT:  Head: Normocephalic and atraumatic.  Cardiovascular: Normal rate and regular rhythm.   No murmur heard. Pulmonary/Chest: Effort normal and breath sounds normal. No respiratory distress.  Abdominal: Soft. There is no tenderness. There is no rebound and  no guarding.  Musculoskeletal: He exhibits no edema or tenderness.  Neurological: He is alert and oriented to person, place, and time.  Skin: Skin is warm and dry.  Psychiatric: He has a normal mood and affect. His behavior is normal.  Nursing note and vitals reviewed.    ED Treatments / Results  Labs (all labs ordered are listed, but only abnormal results are displayed) Labs Reviewed  BASIC METABOLIC PANEL - Abnormal; Notable for the following:       Result Value   Sodium 133 (*)    CO2 19 (*)    Glucose, Bld 361 (*)    Creatinine, Ser 1.30 (*)    GFR calc non Af Amer 54 (*)    All other components within normal limits  I-STAT TROPOININ, ED - Abnormal; Notable for the following:    Troponin i, poc 0.22 (*)    All other components within normal limits  CBC    EKG  EKG Interpretation  Date/Time:  Monday August 02 2015 14:22:57 EDT Ventricular Rate:  66 PR Interval:  168 QRS Duration: 90 QT Interval:  442 QTC Calculation: 463 R Axis:   106 Text Interpretation:  Sinus rhythm with Premature atrial complexes Rightward axis Nonspecific ST and T wave abnormality Abnormal ECG Confirmed by Hazle Coca [phone]) on 08/02/2015 3:52:39 PM       Radiology Dg Chest 2 View  Result Date: 08/02/2015 CLINICAL DATA:  Chest pain EXAM: CHEST  2 VIEW COMPARISON:  July 28, 2014 FINDINGS: The patient is status post CABG/sternotomy. The heart, hila, mediastinum, lungs, and pleura are unchanged with no acute abnormalities. IMPRESSION: No active cardiopulmonary disease. Electronically Signed   By: Dorise Bullion III M.D   On: 08/02/2015 15:42    Procedures Procedures (including critical care time)  Medications Ordered in ED Medications  aspirin chewable tablet 324 mg (324 mg Oral Given 08/02/15 1545)     Initial Impression / Assessment and Plan / ED Course  I have reviewed the triage vital signs and the nursing notes.  Pertinent labs & imaging results that were available during my care of  the patient were reviewed by me and considered in my medical decision making (see chart for details).  Clinical Course    Patient with history of five-vessel CABG here for exertional dyspnea and chest pain. He is pain-free in the  emergency department. Plan is elevated. Given his history concerning for unstable angina. He was given aspirin in the department and started on a heparin drip. Cardiology consulted for further treatment.  Final Clinical Impressions(s) / ED Diagnoses   Final diagnoses:  Unstable angina [facility])    New Prescriptions New Prescriptions   No medications on file     Quintella Reichert, MD 08/03/15 0025

## 2015-08-02 NOTE — ED Notes (Signed)
Received heparin drip from Pharmacy, advised to not began heparin drip until 1830 per pharmacy and ED MD.

## 2015-08-03 ENCOUNTER — Encounter (HOSPITAL_COMMUNITY)
Admission: EM | Disposition: A | Discharge status: Home / Self Care | Payer: Self-pay | Source: Home / Self Care | Attending: Emergency Medicine

## 2015-08-03 ENCOUNTER — Encounter (HOSPITAL_COMMUNITY): Payer: Self-pay | Admitting: General Practice

## 2015-08-03 DIAGNOSIS — I480 Paroxysmal atrial fibrillation: Secondary | ICD-10-CM | POA: Diagnosis not present

## 2015-08-03 DIAGNOSIS — E785 Hyperlipidemia, unspecified: Secondary | ICD-10-CM | POA: Diagnosis not present

## 2015-08-03 DIAGNOSIS — I2510 Atherosclerotic heart disease of native coronary artery without angina pectoris: Secondary | ICD-10-CM | POA: Diagnosis not present

## 2015-08-03 DIAGNOSIS — I214 Non-ST elevation (NSTEMI) myocardial infarction: Secondary | ICD-10-CM | POA: Diagnosis not present

## 2015-08-03 DIAGNOSIS — I10 Essential (primary) hypertension: Secondary | ICD-10-CM | POA: Diagnosis not present

## 2015-08-03 HISTORY — PX: CARDIAC CATHETERIZATION: SHX172

## 2015-08-03 LAB — APTT
APTT: 46 s — AB (ref 24–36)
APTT: 51 s — AB (ref 24–36)

## 2015-08-03 LAB — BASIC METABOLIC PANEL
Anion gap: 9 (ref 5–15)
BUN: 12 mg/dL (ref 6–20)
CHLORIDE: 103 mmol/L (ref 101–111)
CO2: 24 mmol/L (ref 22–32)
Calcium: 9 mg/dL (ref 8.9–10.3)
Creatinine, Ser: 0.9 mg/dL (ref 0.61–1.24)
GFR calc non Af Amer: >60 mL/min (ref >60)
Glucose, Bld: 235 mg/dL — ABNORMAL HIGH (ref 65–99)
POTASSIUM: 4.1 mmol/L (ref 3.5–5.1)
SODIUM: 136 mmol/L (ref 135–145)

## 2015-08-03 LAB — LIPID PANEL
CHOLESTEROL: 167 mg/dL (ref 0–200)
HDL: 39 mg/dL — AB (ref >40)
LDL Cholesterol: 96 mg/dL (ref 0–99)
TRIGLYCERIDES: 160 mg/dL — AB (ref <150)
Total CHOL/HDL Ratio: 4.3 RATIO
VLDL: 32 mg/dL (ref 0–40)

## 2015-08-03 LAB — HEPARIN LEVEL (UNFRACTIONATED): HEPARIN UNFRACTIONATED: 0.29 [IU]/mL — AB (ref 0.30–0.70)

## 2015-08-03 LAB — GLUCOSE, CAPILLARY
GLUCOSE-CAPILLARY: 241 mg/dL — AB (ref 65–99)
Glucose-Capillary: 216 mg/dL — ABNORMAL HIGH (ref 65–99)
Glucose-Capillary: 146 mg/dL — ABNORMAL HIGH (ref 65–99)
Glucose-Capillary: 282 mg/dL — ABNORMAL HIGH (ref 65–99)

## 2015-08-03 LAB — TROPONIN I
Troponin I: 0.87 ng/mL (ref <0.03)
Troponin I: 0.57 ng/mL (ref <0.03)

## 2015-08-03 SURGERY — LEFT HEART CATH AND CORS/GRAFTS ANGIOGRAPHY

## 2015-08-03 MED ORDER — IOPAMIDOL (ISOVUE-370) INJECTION 76%
INTRAVENOUS | Status: DC | PRN
  Administered 2015-08-03: 100 mL via INTRA_ARTERIAL

## 2015-08-03 MED ORDER — HEPARIN SODIUM (PORCINE) 1000 UNIT/ML IJ SOLN
INTRAMUSCULAR | Status: AC
  Filled 2015-08-03: qty 1

## 2015-08-03 MED ORDER — IOPAMIDOL (ISOVUE-370) INJECTION 76%
INTRAVENOUS | Status: AC
  Filled 2015-08-03: qty 125

## 2015-08-03 MED ORDER — SODIUM CHLORIDE 0.9 % IV SOLN: 250.0000 mL | INTRAVENOUS | Status: DC | PRN

## 2015-08-03 MED ORDER — SODIUM CHLORIDE 0.9% FLUSH: 3.0000 mL | INTRAVENOUS | Status: DC | PRN

## 2015-08-03 MED ORDER — SODIUM CHLORIDE 0.9 % IV SOLN
INTRAVENOUS | Status: AC
  Administered 2015-08-03: 18:00:00 via INTRAVENOUS

## 2015-08-03 MED ORDER — MIDAZOLAM HCL 2 MG/2ML IJ SOLN
INTRAMUSCULAR | Status: AC
  Filled 2015-08-03: qty 2

## 2015-08-03 MED ORDER — INSULIN ASPART 100 UNIT/ML ~~LOC~~ SOLN
0.0000 [IU] | Freq: Three times a day (TID) | SUBCUTANEOUS | Status: DC
  Administered 2015-08-03: 5 [IU] via SUBCUTANEOUS
  Administered 2015-08-04: 15 [IU] via SUBCUTANEOUS
  Administered 2015-08-04: 5 [IU] via SUBCUTANEOUS
  Administered 2015-08-04: 8 [IU] via SUBCUTANEOUS

## 2015-08-03 MED ORDER — FUROSEMIDE 10 MG/ML IJ SOLN
40.0000 mg | Freq: Once | INTRAMUSCULAR | Status: AC
  Administered 2015-08-03: 40 mg via INTRAVENOUS
  Filled 2015-08-03: qty 4

## 2015-08-03 MED ORDER — CLOPIDOGREL BISULFATE 75 MG PO TABS
300.0000 mg | ORAL_TABLET | Freq: Once | ORAL | Status: AC
Start: 2015-08-03 — End: 2015-08-03
  Administered 2015-08-03: 300 mg via ORAL
  Filled 2015-08-03: qty 4

## 2015-08-03 MED ORDER — LIDOCAINE HCL (PF) 1 % IJ SOLN
INTRAMUSCULAR | Status: AC
  Filled 2015-08-03: qty 30

## 2015-08-03 MED ORDER — FENTANYL CITRATE (PF) 100 MCG/2ML IJ SOLN
INTRAMUSCULAR | Status: AC
  Filled 2015-08-03: qty 2

## 2015-08-03 MED ORDER — HEPARIN (PORCINE) IN NACL 2-0.9 UNIT/ML-% IJ SOLN
INTRAMUSCULAR | Status: AC
  Filled 2015-08-03: qty 1000

## 2015-08-03 MED ORDER — HEPARIN (PORCINE) IN NACL 2-0.9 UNIT/ML-% IJ SOLN
INTRAMUSCULAR | Status: DC | PRN
  Administered 2015-08-03: 1000 mL

## 2015-08-03 MED ORDER — HEPARIN SODIUM (PORCINE) 1000 UNIT/ML IJ SOLN
INTRAMUSCULAR | Status: DC | PRN
  Administered 2015-08-03: 4500 [IU] via INTRAVENOUS

## 2015-08-03 MED ORDER — FENTANYL CITRATE (PF) 100 MCG/2ML IJ SOLN
INTRAMUSCULAR | Status: DC | PRN
  Administered 2015-08-03: 25 ug via INTRAVENOUS

## 2015-08-03 MED ORDER — VERAPAMIL HCL 2.5 MG/ML IV SOLN
INTRAVENOUS | Status: AC
  Filled 2015-08-03: qty 2

## 2015-08-03 MED ORDER — MIDAZOLAM HCL 2 MG/2ML IJ SOLN
INTRAMUSCULAR | Status: DC | PRN
  Administered 2015-08-03: 1 mg via INTRAVENOUS

## 2015-08-03 MED ORDER — SODIUM CHLORIDE 0.9% FLUSH
3.0000 mL | Freq: Two times a day (BID) | INTRAVENOUS | Status: DC
  Administered 2015-08-03 – 2015-08-04 (×2): 3 mL via INTRAVENOUS

## 2015-08-03 MED ORDER — VERAPAMIL HCL 2.5 MG/ML IV SOLN
INTRAVENOUS | Status: DC | PRN
  Administered 2015-08-03: 10 mL via INTRA_ARTERIAL

## 2015-08-03 MED ORDER — APIXABAN 5 MG PO TABS
5.0000 mg | ORAL_TABLET | Freq: Two times a day (BID) | ORAL | Status: DC
  Administered 2015-08-04: 5 mg via ORAL
  Filled 2015-08-03: qty 1

## 2015-08-03 MED ORDER — LIDOCAINE HCL (PF) 1 % IJ SOLN
INTRAMUSCULAR | Status: DC | PRN
  Administered 2015-08-03: 3 mL

## 2015-08-03 MED ORDER — CLOPIDOGREL BISULFATE 75 MG PO TABS
75.0000 mg | ORAL_TABLET | Freq: Every day | ORAL | Status: DC
Start: 2015-08-04 — End: 2015-08-04
  Filled 2015-08-03: qty 1

## 2015-08-03 SURGICAL SUPPLY — 10 items
CATH INFINITI 5 FR IM (CATHETERS) ×3 IMPLANT
CATH INFINITI 5FR MULTPACK ANG (CATHETERS) ×3 IMPLANT
DEVICE RAD COMP TR BAND LRG (VASCULAR PRODUCTS) ×3 IMPLANT
GLIDESHEATH SLEND SS 6F .021 (SHEATH) ×3 IMPLANT
KIT HEART LEFT (KITS) ×3 IMPLANT
PACK CARDIAC CATHETERIZATION (CUSTOM PROCEDURE TRAY) ×3 IMPLANT
SYR MEDRAD MARK V 150ML (SYRINGE) ×3 IMPLANT
TRANSDUCER W/STOPCOCK (MISCELLANEOUS) ×3 IMPLANT
TUBING CIL FLEX 10 FLL-RA (TUBING) ×3 IMPLANT
WIRE SAFE-T 1.5MM-J .035X260CM (WIRE) ×3 IMPLANT

## 2015-08-03 NOTE — H&P (View-Only) (Signed)
SUBJECTIVE:  No further CP  OBJECTIVE:   Vitals:   Vitals:   08/02/15 1915 08/02/15 1945 08/02/15 2030 08/03/15 0435  BP: 132/85 142/98 (!) 153/85 126/72  Pulse: 67 70 73 63  Resp:   18 18  Temp:   98.9 F (37.2 C) 98.2 F (36.8 C)  TempSrc:   Oral Oral  SpO2: 97% 99% 99% 99%  Weight:   194 lb 0.1 oz (88 kg)   Height:   5' 8"  (1.727 m)    I&O's:  No intake or output data in the 24 hours ending 08/03/15 0916 TELEMETRY: Reviewed telemetry pt in NSR:     PHYSICAL EXAM General: Well developed, well nourished, in no acute distress Head: Eyes PERRLA, No xanthomas.   Normal cephalic and atramatic  Lungs:   Clear bilaterally to auscultation and percussion. Heart:   HRRR S1 S2 Pulses are 2+ & equal.            No carotid bruit. No JVD.  No abdominal bruits. No femoral bruits. Abdomen: Bowel sounds are positive, abdomen soft and non-tender without masses Msk:  Back normal, normal gait. Normal strength and tone for age. Extremities:   No clubbing, cyanosis or edema.  DP +1 Neuro: Alert and oriented X 3. Psych:  Good affect, responds appropriately   LABS: Basic Metabolic Panel:  Recent Labs  08/02/15 1420 08/03/15 0810  NA 133* 136  K 4.3 4.1  CL 103 103  CO2 19* 24  GLUCOSE 361* 235*  BUN 16 12  CREATININE 1.30* 0.90  CALCIUM 9.8 9.0   Liver Function Tests: No results for input(s): AST, ALT, ALKPHOS, BILITOT, PROT, ALBUMIN in the last 72 hours. No results for input(s): LIPASE, AMYLASE in the last 72 hours. CBC:  Recent Labs  08/02/15 1420  WBC 6.5  HGB 13.9  HCT 42.3  MCV 87.9  PLT 195   Cardiac Enzymes:  Recent Labs  08/02/15 2131 08/03/15 0248 08/03/15 0810  TROPONINI 0.99* 0.87* 0.57*   BNP: Invalid input(s): POCBNP D-Dimer: No results for input(s): DDIMER in the last 72 hours. Hemoglobin A1C: No results for input(s): HGBA1C in the last 72 hours. Fasting Lipid Panel:  Recent Labs  08/03/15 0248  CHOL 167  HDL 39*  LDLCALC 96  TRIG  160*  CHOLHDL 4.3   Thyroid Function Tests: No results for input(s): TSH, T4TOTAL, T3FREE, THYROIDAB in the last 72 hours.  Invalid input(s): FREET3 Anemia Panel: No results for input(s): VITAMINB12, FOLATE, FERRITIN, TIBC, IRON, RETICCTPCT in the last 72 hours. Coag Panel:   Lab Results  Component Value Date   INR 1.35 06/22/2014   INR 1.13 06/18/2014   INR 1.18 06/18/2014    RADIOLOGY: Dg Chest 2 View  Result Date: 08/02/2015 CLINICAL DATA:  Chest pain EXAM: CHEST  2 VIEW COMPARISON:  July 28, 2014 FINDINGS: The patient is status post CABG/sternotomy. The heart, hila, mediastinum, lungs, and pleura are unchanged with no acute abnormalities. IMPRESSION: No active cardiopulmonary disease. Electronically Signed   By: Dorise Bullion III M.D   On: 08/02/2015 15:42   ASSESSMENT AND PLAN  1. NSTEMI with progressive chest pain concerning for class III angina: Despite his chest pain slightly different from his previous angina, the exertional component is very concerning for unstable angina.He has had intermittent CP for several days but on day of admission had 3 episodes of severe weakness with drenching diaphoresis.   Peak Trop is 0.99 and now trending downward.  Eliquis on hold (last  dose yesterday am).  Plan for cath this afternoon.  Risk and benefit of procedure explained to the patient who display clear understanding and agree to proceed. Discussed with patient possible procedural risk include bleeding, vascular injury, renal injury, arrythmia, MI, stroke and loss of limb or life.  2. AKI: Cr 1.3 on admission, 0.8 four month ago.  Now 0.9 after overnight hydration.  3. CAD s/p CABG x 5 LIMA to LAD, SVG to diag/ramus, SVG to PDA/PLA.  Continue ASA/NTPaste/BB/statin and IV Heparin gtt.  4.  PAF on eliquis s/p MAZE and LAA clip                       - CHA2DS2-Vasc score 4 (HTN, DM, age, CAD)                       - if need DAPT, will need reassess whether he require eliquis on term esp  after LAA clip and MAZE.                        - continue BB  5. HTN: continue BB, potentially add ACEI after cath 6. HLD: continue crestor but increase to 30m daily due to increased LDL (96).  Repeat FLP in 6 weeks.  7. DM II: hold oral antihyperglycemic for cath.    TFransico Him MD  08/03/2015  9:16 AM

## 2015-08-03 NOTE — Progress Notes (Signed)
ANTICOAGULATION CONSULT NOTE - Follow Up Consult  Pharmacy Consult for heparin Indication: atrial fibrillation  Labs:  Recent Labs  08/02/15 1420 08/02/15 2131 08/03/15 0248 08/03/15 0810 08/03/15 1246  HGB 13.9  --   --   --   --   HCT 42.3  --   --   --   --   PLT 195  --   --   --   --   APTT 30  --  46*  --  51*  HEPARINUNFRC 0.27*  --  0.29*  --   --   CREATININE 1.30*  --   --  0.90  --   TROPONINI  --  0.99* 0.87* 0.57*  --      Assessment: 70yo male continuing heparin for afib (apixaban pta, hx s/p MAZE, LA clip). APTT low but drip is off for cath this afternoon. Will f/u resuming AC as appropriate post-cath. CBC wnl, no bleed documented.  Goal of Therapy:  aPTT 66-102 seconds   Plan:  F/u [facility] plan post-cath (apix pta)  Elicia Lamp, PharmD, [facility] Clinical Pharmacist Pager [phone] 08/03/2015 1:56 PM

## 2015-08-03 NOTE — Progress Notes (Signed)
Inpatient Diabetes Program Recommendations  AACE/ADA: New Consensus Statement on Inpatient Glycemic Control (2015)  Target Ranges:  Prepandial:   less than 140 mg/dL      Peak postprandial:   less than 180 mg/dL (1-2 hours)      Critically ill patients:  140 - 180 mg/dL   Lab Results  Component Value Date   GLUCAP 241 (H) 08/03/2015   HGBA1C 8.8 03/10/2015    Review of Glycemic Control Results for [name], [name] (MRN [id number]) as of 08/03/2015 10:26  Ref. Range 08/03/2015 06:39  Glucose-Capillary Latest Ref Range: 65 - 99 mg/dL 241 (H)   A1C in progress  Diabetes history: Type 2  Outpatient Diabetes medications: Glucovance 2.5/500mg  bid Current orders for Inpatient glycemic control: Novolog 0-15 units tid   Inpatient Diabetes Program Recommendations: Please change Novolog to q4h until the patient is no longer NPO- DO NOT HOLD IF PATIENT IS NPO.   Patient may require the addition of Lantus insulin if A1C is elevated.    Gentry Fitz, RN, BA, MHA, CDE Diabetes Coordinator Inpatient Diabetes Program  [phone] (Team Pager) [phone] ([place]) 08/03/2015 10:51 AM

## 2015-08-03 NOTE — Interval H&P Note (Signed)
History and Physical Interval Note:  08/03/2015 3:34 PM  [name]  has presented today for cardiac cath, with the diagnosis of n stemi  The various methods of treatment have been discussed with the patient and family. After consideration of risks, benefits and other options for treatment, the patient has consented to  Procedure(s): Left Heart Cath and Cors/Grafts Angiography (N/A) as a surgical intervention.  The patient's history has been reviewed, patient examined, no change in status, stable for surgery.  I have reviewed the patient's chart and labs.  Questions were answered to the patient's satisfaction.     Cath Lab Visit (complete for each Cath Lab visit)  Clinical Evaluation Leading to the Procedure:   ACS: Yes.    Non-ACS:    Anginal Classification: CCS III  Anti-ischemic medical therapy: Minimal Therapy (1 class of medications)  Non-Invasive Test Results: No non-invasive testing performed  Prior CABG: Previous CABG   [name]

## 2015-08-03 NOTE — Progress Notes (Addendum)
SUBJECTIVE:  No further CP  OBJECTIVE:   Vitals:   Vitals:   08/02/15 1915 08/02/15 1945 08/02/15 2030 08/03/15 0435  BP: 132/85 142/98 (!) 153/85 126/72  Pulse: 67 70 73 63  Resp:   18 18  Temp:   98.9 F (37.2 C) 98.2 F (36.8 C)  TempSrc:   Oral Oral  SpO2: 97% 99% 99% 99%  Weight:   194 lb 0.1 oz (88 kg)   Height:   5' 8"  (1.727 m)    I&O's:  No intake or output data in the 24 hours ending 08/03/15 0916 TELEMETRY: Reviewed telemetry pt in NSR:     PHYSICAL EXAM General: Well developed, well nourished, in no acute distress Head: Eyes PERRLA, No xanthomas.   Normal cephalic and atramatic  Lungs:   Clear bilaterally to auscultation and percussion. Heart:   HRRR S1 S2 Pulses are 2+ & equal.            No carotid bruit. No JVD.  No abdominal bruits. No femoral bruits. Abdomen: Bowel sounds are positive, abdomen soft and non-tender without masses Msk:  Back normal, normal gait. Normal strength and tone for age. Extremities:   No clubbing, cyanosis or edema.  DP +1 Neuro: Alert and oriented X 3. Psych:  Good affect, responds appropriately   LABS: Basic Metabolic Panel:  Recent Labs  08/02/15 1420 08/03/15 0810  NA 133* 136  K 4.3 4.1  CL 103 103  CO2 19* 24  GLUCOSE 361* 235*  BUN 16 12  CREATININE 1.30* 0.90  CALCIUM 9.8 9.0   Liver Function Tests: No results for input(s): AST, ALT, ALKPHOS, BILITOT, PROT, ALBUMIN in the last 72 hours. No results for input(s): LIPASE, AMYLASE in the last 72 hours. CBC:  Recent Labs  08/02/15 1420  WBC 6.5  HGB 13.9  HCT 42.3  MCV 87.9  PLT 195   Cardiac Enzymes:  Recent Labs  08/02/15 2131 08/03/15 0248 08/03/15 0810  TROPONINI 0.99* 0.87* 0.57*   BNP: Invalid input(s): POCBNP D-Dimer: No results for input(s): DDIMER in the last 72 hours. Hemoglobin A1C: No results for input(s): HGBA1C in the last 72 hours. Fasting Lipid Panel:  Recent Labs  08/03/15 0248  CHOL 167  HDL 39*  LDLCALC 96  TRIG  160*  CHOLHDL 4.3   Thyroid Function Tests: No results for input(s): TSH, T4TOTAL, T3FREE, THYROIDAB in the last 72 hours.  Invalid input(s): FREET3 Anemia Panel: No results for input(s): VITAMINB12, FOLATE, FERRITIN, TIBC, IRON, RETICCTPCT in the last 72 hours. Coag Panel:   Lab Results  Component Value Date   INR 1.35 06/22/2014   INR 1.13 06/18/2014   INR 1.18 06/18/2014    RADIOLOGY: Dg Chest 2 View  Result Date: 08/02/2015 CLINICAL DATA:  Chest pain EXAM: CHEST  2 VIEW COMPARISON:  July 28, 2014 FINDINGS: The patient is status post CABG/sternotomy. The heart, hila, mediastinum, lungs, and pleura are unchanged with no acute abnormalities. IMPRESSION: No active cardiopulmonary disease. Electronically Signed   By: Dorise Bullion III M.D   On: 08/02/2015 15:42   ASSESSMENT AND PLAN  1. NSTEMI with progressive chest pain concerning for class III angina: Despite his chest pain slightly different from his previous angina, the exertional component is very concerning for unstable angina.He has had intermittent CP for several days but on day of admission had 3 episodes of severe weakness with drenching diaphoresis.   Peak Trop is 0.99 and now trending downward.  Eliquis on hold (last  dose yesterday am).  Plan for cath this afternoon.  Risk and benefit of procedure explained to the patient who display clear understanding and agree to proceed. Discussed with patient possible procedural risk include bleeding, vascular injury, renal injury, arrythmia, MI, stroke and loss of limb or life.  2. AKI: Cr 1.3 on admission, 0.8 four month ago.  Now 0.9 after overnight hydration.  3. CAD s/p CABG x 5 LIMA to LAD, SVG to diag/ramus, SVG to PDA/PLA.  Continue ASA/NTPaste/BB/statin and IV Heparin gtt.  4.  PAF on eliquis s/p MAZE and LAA clip                       - CHA2DS2-Vasc score 4 (HTN, DM, age, CAD)                       - if need DAPT, will need reassess whether he require eliquis on term esp  after LAA clip and MAZE.                        - continue BB  5. HTN: continue BB, potentially add ACEI after cath 6. HLD: continue crestor but increase to 20m daily due to increased LDL (96).  Repeat FLP in 6 weeks.  7. DM II: hold oral antihyperglycemic for cath.    TFransico Him MD  08/03/2015  9:16 AM

## 2015-08-03 NOTE — Progress Notes (Signed)
ANTICOAGULATION CONSULT NOTE - Follow Up Consult  Pharmacy Consult for heparin Indication: atrial fibrillation  Labs:  Recent Labs  08/02/15 1420 08/02/15 2131 08/03/15 0248  HGB 13.9  --   --   HCT 42.3  --   --   PLT 195  --   --   APTT 30  --  46*  HEPARINUNFRC 0.27*  --  0.29*  CREATININE 1.30*  --   --   TROPONINI  --  0.99* 0.87*     Assessment: 70yo male subtherapeutic on heparin with initial dosing while Eliquis on hold (using PTT while Eliquis affecting anti-Xa levels).  Goal of Therapy:  aPTT 66-102 seconds   Plan:  Will increase heparin gtt by 3-4 units/kg/hr to 1600 units/hr and check PTT in 8hr.  Wynona Neat, PharmD, BCPS  08/03/2015,4:14 AM

## 2015-08-03 NOTE — Research (Signed)
LEADERS FREE Informed Consent   Subject Name: [name]  Subject met inclusion and exclusion criteria.  The informed consent form, study requirements and expectations were reviewed with the subject and questions and concerns were addressed prior to the signing of the consent form.  The subject verbalized understanding of the trail requirements.  The subject agreed to participate in the LEADERS Free trial and signed the informed consent.  The informed consent was obtained prior to performance of any protocol-specific procedures for the subject.  A copy of the signed informed consent was given to the subject and a copy was placed in the subject's medical record.  [name] 08/03/2015, 7:40

## 2015-08-04 ENCOUNTER — Encounter (HOSPITAL_COMMUNITY): Payer: Self-pay | Admitting: Internal Medicine

## 2015-08-04 ENCOUNTER — Observation Stay (HOSPITAL_BASED_OUTPATIENT_CLINIC_OR_DEPARTMENT_OTHER): Payer: Medicare Other

## 2015-08-04 ENCOUNTER — Other Ambulatory Visit: Payer: Self-pay | Admitting: Physician Assistant

## 2015-08-04 ENCOUNTER — Telehealth: Payer: Self-pay | Admitting: Interventional Cardiology

## 2015-08-04 DIAGNOSIS — I214 Non-ST elevation (NSTEMI) myocardial infarction: Secondary | ICD-10-CM | POA: Diagnosis not present

## 2015-08-04 DIAGNOSIS — I10 Essential (primary) hypertension: Secondary | ICD-10-CM | POA: Diagnosis not present

## 2015-08-04 DIAGNOSIS — N289 Disorder of kidney and ureter, unspecified: Secondary | ICD-10-CM

## 2015-08-04 DIAGNOSIS — I480 Paroxysmal atrial fibrillation: Secondary | ICD-10-CM | POA: Diagnosis not present

## 2015-08-04 DIAGNOSIS — I420 Dilated cardiomyopathy: Secondary | ICD-10-CM

## 2015-08-04 DIAGNOSIS — I255 Ischemic cardiomyopathy: Secondary | ICD-10-CM | POA: Diagnosis not present

## 2015-08-04 DIAGNOSIS — I25110 Atherosclerotic heart disease of native coronary artery with unstable angina pectoris: Secondary | ICD-10-CM | POA: Diagnosis not present

## 2015-08-04 DIAGNOSIS — E785 Hyperlipidemia, unspecified: Secondary | ICD-10-CM | POA: Diagnosis not present

## 2015-08-04 LAB — BASIC METABOLIC PANEL
ANION GAP: 8 (ref 5–15)
BUN: 17 mg/dL (ref 6–20)
CHLORIDE: 104 mmol/L (ref 101–111)
CO2: 24 mmol/L (ref 22–32)
Calcium: 9 mg/dL (ref 8.9–10.3)
Creatinine, Ser: 1 mg/dL (ref 0.61–1.24)
Glucose, Bld: 268 mg/dL — ABNORMAL HIGH (ref 65–99)
POTASSIUM: 3.8 mmol/L (ref 3.5–5.1)
SODIUM: 136 mmol/L (ref 135–145)

## 2015-08-04 LAB — ECHOCARDIOGRAM COMPLETE
HEIGHTINCHES: 68 in
WEIGHTICAEL: 3104.08 [oz_av]

## 2015-08-04 LAB — GLUCOSE, CAPILLARY
GLUCOSE-CAPILLARY: 238 mg/dL — AB (ref 65–99)
Glucose-Capillary: 284 mg/dL — ABNORMAL HIGH (ref 65–99)
Glucose-Capillary: 366 mg/dL — ABNORMAL HIGH (ref 65–99)

## 2015-08-04 LAB — HEMOGLOBIN A1C
HEMOGLOBIN A1C: 11.2 % — AB (ref 4.8–5.6)
Mean Plasma Glucose: 275 mg/dL

## 2015-08-04 LAB — CBC
HCT: 41.5 % (ref 39.0–52.0)
HEMOGLOBIN: 13.5 g/dL (ref 13.0–17.0)
MCH: 28.7 pg (ref 26.0–34.0)
MCHC: 32.5 g/dL (ref 30.0–36.0)
MCV: 88.3 fL (ref 78.0–100.0)
Platelets: 172 10*3/uL (ref 150–400)
RBC: 4.7 MIL/uL (ref 4.22–5.81)
RDW: 14.3 % (ref 11.5–15.5)
WBC: 6.1 10*3/uL (ref 4.0–10.5)

## 2015-08-04 MED ORDER — SPIRONOLACTONE 25 MG PO TABS
25.0000 mg | ORAL_TABLET | Freq: Every day | ORAL | Status: DC
  Administered 2015-08-04: 25 mg via ORAL
  Filled 2015-08-04: qty 1

## 2015-08-04 MED ORDER — LISINOPRIL 5 MG PO TABS
5.0000 mg | ORAL_TABLET | Freq: Every day | ORAL | Status: DC
  Administered 2015-08-04: 5 mg via ORAL
  Filled 2015-08-04: qty 1

## 2015-08-04 MED ORDER — ISOSORBIDE MONONITRATE ER 30 MG PO TB24
30.0000 mg | ORAL_TABLET | Freq: Every day | ORAL | 3 refills | Status: DC
Start: 2015-08-04 — End: 2016-07-29

## 2015-08-04 MED ORDER — LISINOPRIL 5 MG PO TABS: 5.0000 mg | ORAL_TABLET | Freq: Every day | ORAL | 3 refills | Status: DC

## 2015-08-04 MED ORDER — CARVEDILOL 6.25 MG PO TABS: 6.2500 mg | ORAL_TABLET | Freq: Two times a day (BID) | ORAL | 11 refills | Status: DC

## 2015-08-04 MED ORDER — ROSUVASTATIN CALCIUM 10 MG PO TABS
10.0000 mg | ORAL_TABLET | Freq: Every day | ORAL | Status: DC
  Administered 2015-08-04: 10 mg via ORAL
  Filled 2015-08-04: qty 1

## 2015-08-04 MED ORDER — ISOSORBIDE MONONITRATE ER 30 MG PO TB24
30.0000 mg | ORAL_TABLET | Freq: Every day | ORAL | Status: DC
  Administered 2015-08-04: 30 mg via ORAL
  Filled 2015-08-04: qty 1

## 2015-08-04 MED ORDER — CARVEDILOL 6.25 MG PO TABS
6.2500 mg | ORAL_TABLET | Freq: Two times a day (BID) | ORAL | Status: DC
  Administered 2015-08-04: 6.25 mg via ORAL
  Filled 2015-08-04: qty 1

## 2015-08-04 MED ORDER — ROSUVASTATIN CALCIUM 10 MG PO TABS: 10.0000 mg | ORAL_TABLET | Freq: Every day | ORAL | 11 refills | Status: DC

## 2015-08-04 NOTE — Telephone Encounter (Signed)
New message   TCM Appt    08/12/2015  Time: 10:30 AM  Visit Type: OFFICE VISIT 30 [368]  Provider: Charlie Pitter, PA-C

## 2015-08-04 NOTE — Care Management Obs Status (Signed)
[place] NOTIFICATION   Patient Details  Name: [name] MRN: [id number] Date of Birth: 09-Apr-1945   Medicare Observation Status Notification Given:  Yes    Carles Collet, RN 08/04/2015, 11:35 AM

## 2015-08-04 NOTE — Progress Notes (Signed)
Inpatient Diabetes Program Recommendations  AACE/ADA: New Consensus Statement on Inpatient Glycemic Control (2015)  Target Ranges:  Prepandial:   less than 140 mg/dL      Peak postprandial:   less than 180 mg/dL (1-2 hours)      Critically ill patients:  140 - 180 mg/dL   Lab Results  Component Value Date   GLUCAP 284 (H) 08/04/2015   HGBA1C 11.2 (H) 08/02/2015    Review of Glycemic Control: BS 268, 284 Hgb A1c:  11.2 Outpatient Diabetes medications: Glucovance 2.5/500 bid  Current orders for Inpatient glycemic control: Insulin mod scale tid with meals Inpatient Diabetes Program Recommendations: Feel pt would benefit from long acting insulin.  Please consider Lantus 15 units daily.  Based on A1c pt will need insulin when discharged.  Discussed with pt and wife.  Encouraged to follow up with Dr. Laney Pastor, pt's PCP, as soon as possible.  [place], CDE. M.Ed. Pager [phone] Inpatient Diabetes Coordinator

## 2015-08-04 NOTE — Discharge Instructions (Signed)
No driving for 24 hours. No lifting over 5 lbs for 1 week. No sexual activity for 1 week. Keep procedure site clean & dry. If you notice increased pain, swelling, bleeding or pus, call/return!  You may shower, but no soaking baths/hot tubs/pools for 1 week.  ° ° °

## 2015-08-04 NOTE — Care Management Note (Signed)
Case Management Note  Patient Details  Name: SANDRO CRISPIN MRN: [id number] Date of Birth: [birth year]  Subjective/Objective:                 Patient to be fitted for Life vest. Spoke with [employer] Rep Gregary Cromer [phone]). He will contact me when approved by insurance and patient is ready to DC.   Action/Plan:   Expected Discharge Date:                  Expected Discharge Plan:  Home/Self Care  In-House Referral:  NA  Discharge planning Services  CM Consult  Post Acute Care Choice:  Durable Medical Equipment Choice offered to:  Patient  DME Arranged:   (Life Vest) DME Agency:  Other - Comment ([employer])  HH Arranged:    [place] Agency:     Status of Service:  Completed, signed off  If discussed at [employer] of Stay Meetings, dates discussed:    Additional Comments:  Carles Collet, RN 08/04/2015, 11:36 AM

## 2015-08-04 NOTE — Progress Notes (Signed)
SUBJECTIVE:  No complaints  OBJECTIVE:   Vitals:   Vitals:   08/03/15 1800 08/03/15 1830 08/03/15 2124 08/04/15 0529  BP: 120/70 125/65 113/60 135/68  Pulse: 71 79 73 (!) 55  Resp: 18 18 18 18   Temp:   97.8 F (36.6 C) 98.1 F (36.7 C)  TempSrc:   Oral Oral  SpO2:   98% 99%  Weight:      Height:       I&O's:   Intake/Output Summary (Last 24 hours) at 08/04/15 0830 Last data filed at 08/03/15 2125  Gross per 24 hour  Intake              843 ml  Output                0 ml  Net              843 ml   TELEMETRY: Reviewed telemetry pt in NSR:     PHYSICAL EXAM General: Well developed, well nourished, in no acute distress Head: Eyes PERRLA, No xanthomas.   Normal cephalic and atramatic  Lungs:   Clear bilaterally to auscultation and percussion. Heart:   HRRR S1 S2 Pulses are 2+ & equal.            No carotid bruit. No JVD.  No abdominal bruits. No femoral bruits. Abdomen: Bowel sounds are positive, abdomen soft and non-tender without masses  Msk:  Back normal, normal gait. Normal strength and tone for age. Extremities:   No clubbing, cyanosis or edema.  DP +1 Neuro: Alert and oriented X 3. Psych:  Good affect, responds appropriately   LABS: Basic Metabolic Panel:  Recent Labs  08/03/15 0810 08/04/15 0230  NA 136 136  K 4.1 3.8  CL 103 104  CO2 24 24  GLUCOSE 235* 268*  BUN 12 17  CREATININE 0.90 1.00  CALCIUM 9.0 9.0   Liver Function Tests: No results for input(s): AST, ALT, ALKPHOS, BILITOT, PROT, ALBUMIN in the last 72 hours. No results for input(s): LIPASE, AMYLASE in the last 72 hours. CBC:  Recent Labs  08/02/15 1420 08/04/15 0230  WBC 6.5 6.1  HGB 13.9 13.5  HCT 42.3 41.5  MCV 87.9 88.3  PLT 195 172   Cardiac Enzymes:  Recent Labs  08/02/15 2131 08/03/15 0248 08/03/15 0810  TROPONINI 0.99* 0.87* 0.57*   BNP: Invalid input(s): POCBNP D-Dimer: No results for input(s): DDIMER in the last 72 hours. Hemoglobin A1C:  Recent Labs  08/02/15 2130  HGBA1C 11.2*   Fasting Lipid Panel:  Recent Labs  08/03/15 0248  CHOL 167  HDL 39*  LDLCALC 96  TRIG 160*  CHOLHDL 4.3   Thyroid Function Tests: No results for input(s): TSH, T4TOTAL, T3FREE, THYROIDAB in the last 72 hours.  Invalid input(s): FREET3 Anemia Panel: No results for input(s): VITAMINB12, FOLATE, FERRITIN, TIBC, IRON, RETICCTPCT in the last 72 hours. Coag Panel:   Lab Results  Component Value Date   INR 1.35 06/22/2014   INR 1.13 06/18/2014   INR 1.18 06/18/2014    RADIOLOGY: Dg Chest 2 View  Result Date: 08/02/2015 CLINICAL DATA:  Chest pain EXAM: CHEST  2 VIEW COMPARISON:  July 28, 2014 FINDINGS: The patient is status post CABG/sternotomy. The heart, hila, mediastinum, lungs, and pleura are unchanged with no acute abnormalities. IMPRESSION: No active cardiopulmonary disease. Electronically Signed   By: Dorise Bullion III M.D   On: 08/02/2015 15:42    ASSESSMENT AND PLAN  1. NSTEMI with  progressive chest pain concerning for class III angina: Despite his chest pain slightly different from his previous angina, the exertional component is very concerning for unstable angina.He has had intermittent CP for several days but on day of admission had 3 episodes of severe weakness with drenching diaphoresis.   Peak Trop is 0.99 and now trending downward. Cath yesterday showed severe 3 vessel ASCAD with patent LIMA to LAD, patent but small and diffusely diseased SVG to PDA and PL with 90% stenosis of distal SVG, ostially occluded sequential SVG to diag and ramus and severe LV dysfunction with EF 20-25%.  Medical management recommended.  Small, diffusely diseased SVG to PDA and PL branch is not a good PCI target. Continue ASA and stop Plavix since he is on Eliquis.    2.  Ischemic DCM with EF 20-25% by cath.  Will get echo today to assess LVF further.  If EF < 35% will need LifeVest for 2 months with reassessment of LVF after 2 months of maximal med therapy.   Change BB to Coreg 6.55m BID.  Add Lisinopril 552mdaily.  Add Aldactone 251maily.  [facility]    3. AKI: Cr 1.3 on admission, 0.8 four month ago.  Now 1.0 post cath.  4. CAD s/p CABG x 5 LIMA to LAD, SVG to diag/ramus, SVG to PDA/PLA - cath yesterday as above.  Continue BB/ASA and statin.  Will add Imdur 33m64mily and titrate as needed for angina.  5. PAF on eliquis s/p MAZE and LAA clip - CHA2DS2-Vasc score 4 (HTN, DM, age, CAD) - Restart Eliquis this am                       - continue BB 6. HTN: BP controlled. Continue BB.   7. HLD: continue crestor but increase to 10mg27mly due to increased LDL (96).  Repeat FLP in 6 weeks.  8. DM II: hold oral antihyperglycemic for cath.  Will discharge later today if we can get LifeVest set up.  Early TOC followup in 1 week with BMET.  TraciFransico Him 08/04/2015  8:30 AM

## 2015-08-04 NOTE — Discharge Summary (Signed)
Discharge Summary    Patient ID: [name],  MRN: [id number], DOB/AGE: [birth year] 70 y.o.  Admit date: 08/02/2015 Discharge date: 08/04/2015  Primary Care Provider: Leandrew Koyanagi Primary Cardiologist: Dr. Irish Lack Electrophysiologist: Dr. Rayann Heman  Discharge Diagnoses    Principal Problem:   NSTEMI (non-ST elevated myocardial infarction) [facility]) Active Problems:   DM type 2 (diabetes mellitus, type 2) ([place])   ATRIAL FIBRILLATION, PAROXYSMAL   Hypertension   Hyperlipidemia   CAD (coronary artery disease), native coronary artery   Acute renal insufficiency   Allergies Allergies  Allergen Reactions  . Lipitor [Atorvastatin] Other (See Comments)    Myopathy Spring 2006  . Pravastatin Other (See Comments)    LEG CRAMPS  . Simvastatin Other (See Comments)    LEG CRAMPS    Diagnostic Studies/Procedures    Cath 08/03/2015 Procedures   Left Heart Cath and Cors/Grafts Angiography  Conclusion   1.  Severe 3-vessel native coronary artery disease. 2.  Patent LIMA to LAD. 3.  Patent but small and diffusely diseased sequential SVG to PDA and PL with discrete 90% stenosis in the distal SVG. 4.  Ostially occluded sequential SVG to diagonal and ramus. 5.  Severe global LV dysfunction with LVEF 20-25%. 6.  Mildly elevated left ventricular filling pressure.  Recommendations: 1.  Medical management of severe multivessel coronary artery disease.  Small, diffusely diseased SVG to PDA and PL branch is not a good PCI target. 2.  Transthoracic echocardiogram to further assess LV structure. 3.  Aggressive medical optimization of coronary artery disease and severe systolic heart failure.    Echo 08/04/2015 LV EF: 50% -   55%  ------------------------------------------------------------------- Indications:      Cardiomyopathy - ischemic 414.8.  ------------------------------------------------------------------- History:   PMH:   Murmur.  Atrial fibrillation.  Coronary  artery disease.  PMH:   Myocardial infarction.  Risk factors:  Diabetes mellitus.  ------------------------------------------------------------------- Study Conclusions  - Left ventricle: Inferobasal hypokinesis. The cavity size was   normal. Systolic function was normal. The estimated ejection   fraction was in the range of 50% to 55%. Wall motion was normal;   there were no regional wall motion abnormalities. Left   ventricular diastolic function parameters were normal. - Mitral valve: There was mild regurgitation. - Left atrium: The atrium was mildly dilated. - Atrial septum: No defect or patent foramen ovale was identified. _____________   History of Present Illness     [name] is a 70 yo male with PMH of HTN, HLD, DM II, PAF on eliquis s/p MAZE and LAA clip, and CAD s/p CABG x 5 LIMA to LAD, SVG to diag/ramus, SVG to PDA/PLA. Patient underwent 5 vessel CABG in June 2016. Since then, he has been doing relatively well. He does complain of bilateral lower extremity numbness which he says has been persistent since the bypass surgery on both side of the leg. He only had vein harvesting from the right lower extremity. His last office visit with Dr. Irish Lack was in February 2017, at which time he asked Dr. Irish Lack regarding stop the eliquis. Of note, patient did have LAA clip and MAZE during CABG. Dr. Irish Lack referred the patient to Dr. Rayann Heman who saw the patient on 03/24/2015, various option has been discussed with patient including: 1. resuming eliquis, 2. stopping eliquis, 3. test A. fib burden with loop recorder before stopping eliquis, and 4. potentially obtain TEE to ensure adequate closure of left atrial appendage clipping before stopping eliquis. Patient opted to continue  eliquis since then.  He has been compliant with his medication. He was doing well until last Thursday. He was playing golf last Thursday when he started having substernal chest soreness while walking on the field.  He also had diaphoresis and shortness of breath with exertion. The symptom resolved after he stopped walking. On Friday while walking on the beach with his wife, he has similar symptom. This morning he went back to play golf again, however had to stop after he had 3 episode of chest pain each occurred while walking up an incline. Since he had the chest pain on the golf course, he continued to have chest soreness since then. Due to the frequency of exertional symptom, he sought medical attention at [facility] ED. Interestingly, he said prior to the previous bypass surgery his anginal symptom was sharp stabbing pain.  Initial laboratory finding was significant for sodium 133, creatinine 1.3. Troponin 0.22. Chest x-ray was negative for acute process. EKG showed Nonspecific ST-T wave changes. Cardiology has been consulted for exertional chest pain.   Hospital Course     Given the mildly elevated troponin and exertional type symptoms, the plan was to proceed with cardiac catheterization. Given mild acute kidney injury and lack of chest discomfort on arrival, patient underwent overnight IV hydration with improvement in renal function. His trop did go up to 0.99. Eliquis was held in anticipation of cardiac catheterization. He underwent cardiac catheterization on 08/03/2015 which showed severe three-vessel CAD, patent LIMA to LAD, discrete 90% stenosis in distal sequential SVG to PDA and PLA which is not good PCI target, ostial occlusion of sequential SVG to diagonal and ramus, severe global LV dysfunction with EF 20-25%. Given the decreased ejection fraction, originally it was planned for the patient to start on lifevest and reassess EF in 2 months. Beta blocker was changed to carvedilol 6.25 mg twice a day. Lisinopril 5 mg daily was added along with spironolactone 25 mg daily. Plavix was stopped as patient will require aspirin and eliquis. Imdur 30 mg daily was added for antianginal purposes. Crestor was also  increased to 10 mg daily from 3 times weekly due to increased LDL.  Patient was seen in the morning of 08/04/2015 at which time he denies significant chest discomfort. He is deemed stable for discharge from cardiology perspective. He will require one week outpatient basic metabolic panel, 6 weeks outpatient fasting lipid panel and liver function test. I will arrange seven-day transition of care follow-up for the patient.  Prior to discharge however, echocardiogram came back showing EF has improved to 50-55%. Lifevest has been canceled. After discuss with Dr. Radford Pax who has over read the image and felt that the ejection fraction is about 45%. She recommended stopping spironolactone for now. Patient has been informed of new echo finding and the plan to hold off on lifevest.  _____________  Discharge Vitals Blood pressure 120/62, pulse 63, temperature 98 F (36.7 C), temperature source Oral, resp. rate 18, height 5\' 8"  (1.727 m), weight 194 lb 0.1 oz (88 kg), SpO2 100 %.  Filed Weights   08/02/15 2030  Weight: 194 lb 0.1 oz (88 kg)    Labs & Radiologic Studies     CBC  Recent Labs  08/02/15 1420 08/04/15 0230  WBC 6.5 6.1  HGB 13.9 13.5  HCT 42.3 41.5  MCV 87.9 88.3  PLT 195 [id number]   Basic Metabolic Panel  Recent Labs  08/03/15 0810 08/04/15 0230  NA 136 136  K 4.1 3.8  CL  103 104  CO2 24 24  GLUCOSE 235* 268*  BUN 12 17  CREATININE 0.90 1.00  CALCIUM 9.0 9.0   Cardiac Enzymes  Recent Labs  08/02/15 2131 08/03/15 0248 08/03/15 0810  TROPONINI 0.99* 0.87* 0.57*   Hemoglobin A1C  Recent Labs  08/02/15 2130  HGBA1C 11.2*   Fasting Lipid Panel  Recent Labs  08/03/15 0248  CHOL 167  HDL 39*  LDLCALC 96  TRIG 160*  CHOLHDL 4.3    Dg Chest 2 View  Result Date: 08/02/2015 CLINICAL DATA:  Chest pain EXAM: CHEST  2 VIEW COMPARISON:  July 28, 2014 FINDINGS: The patient is status post CABG/sternotomy. The heart, hila, mediastinum, lungs, and pleura are  unchanged with no acute abnormalities. IMPRESSION: No active cardiopulmonary disease. Electronically Signed   By: Dorise Bullion III M.D   On: 08/02/2015 15:42    Disposition   Pt is being discharged home today in good condition.  Follow-up Plans & Appointments    Follow-up Information    Charlie Pitter, PA-C Follow up on 08/12/2015.   Specialties:  Cardiology, Radiology Why:  10:30AM. Cardiology vist with Dr. Hassell Done PA. Obtain BMET on the same day.  Contact information: [address] [place] [address] [phone]        Leandrew Koyanagi, MD. Schedule an appointment as soon as possible for a visit today.   Specialties:  Internal Medicine, Adolescent Medicine           Discharge Medications   Current Discharge Medication List    START taking these medications   Details  carvedilol (COREG) 6.25 MG tablet Take 1 tablet (6.25 mg total) by mouth 2 (two) times daily with a meal. Qty: 60 tablet, Refills: 11    isosorbide mononitrate (IMDUR) 30 MG 24 hr tablet Take 1 tablet (30 mg total) by mouth daily. Qty: 90 tablet, Refills: 3    lisinopril (PRINIVIL,ZESTRIL) 5 MG tablet Take 1 tablet (5 mg total) by mouth daily. Qty: 90 tablet, Refills: 3      CONTINUE these medications which have CHANGED   Details  rosuvastatin (CRESTOR) 10 MG tablet Take 1 tablet (10 mg total) by mouth daily. Qty: 30 tablet, Refills: 11      CONTINUE these medications which have NOT CHANGED   Details  apixaban (ELIQUIS) 5 MG TABS tablet Take 1 tablet (5 mg total) by mouth 2 (two) times daily. Qty: 60 tablet, Refills: 8    aspirin 81 MG tablet Take 81 mg by mouth daily.    dicyclomine (BENTYL) 20 MG tablet Take 1 tablet (20 mg total) by mouth 4 (four) times daily -  before meals and at bedtime. Qty: 120 tablet, Refills: 2    fluorometholone (FML) 0.1 % ophthalmic suspension Place 1 drop into the right eye daily as needed (for eye redness and itching).      fluticasone (FLONASE) 50 MCG/ACT nasal spray Place 1 spray into both nostrils as needed for allergies or rhinitis. Qty: 16 g, Refills: 11    glyBURIDE-metformin (GLUCOVANCE) 2.5-500 MG tablet Take 1 tablet by mouth 2 (two) times daily with a meal. Qty: 180 tablet, Refills: 3    ibuprofen (ADVIL,MOTRIN) 200 MG tablet Take 400 mg by mouth every morning.    MAGNESIUM PO Take 1 tablet by mouth daily as needed (for leg cramping).     nitroGLYCERIN (NITROSTAT) 0.4 MG SL tablet Place 1 tablet (0.4 mg total) under the tongue every 5 (five) minutes as needed for  chest pain. Qty: 25 tablet, Refills: 3    tamsulosin (FLOMAX) 0.4 MG CAPS capsule Take 0.4 mg by mouth as needed. For urination problems    TRULICITY [id number] SOPN Inject 0.75 mg as directed every Wednesday.  Refills: 1      STOP taking these medications     metoprolol tartrate (LOPRESSOR) 25 MG tablet          Aspirin prescribed at discharge?  Yes High Intensity Statin Prescribed? (Lipitor 40-80mg  or Crestor 20-40mg ): No, intolerant Beta Blocker Prescribed? Yes For EF 40% or less, Was ACEI/ARB Prescribed? Yes ADP Receptor Inhibitor Prescribed? (i.e. Plavix etc.-Includes Medically Managed Patients): No: require eliquis For EF <40%, Aldosterone Inhibitor Prescribed? No: EF 50-55% Was EF assessed during THIS hospitalization? Yes Was Cardiac Rehab II ordered? (Included Medically managed Patients): Yes   Outstanding Labs/Studies   BMET in 1 week, LFT and lipid in 6 weeks (to be scheduled on followup)  Duration of Discharge Encounter   Greater than 30 minutes including physician time.  Signed, Almyra Deforest PA-C 08/04/2015, 3:25 PM

## 2015-08-04 NOTE — Progress Notes (Signed)
  Echocardiogram 2D Echocardiogram has been performed.  [name] 08/04/2015, 10:08 AM

## 2015-08-04 NOTE — Progress Notes (Signed)
08/03/2015 1645 Received pt back from Cath Lab.  Pt had a L radial cath.  TR Band has 14cc's of air.  Site is Level 0 at this time.  Encouraged pt to keep arm elevated on pillow and not use the L arm.  Vital signs will be taken per protocol.  TR Band air removal policy will be followed.  Family at bedside, call bell in reach. [place]

## 2015-08-05 NOTE — Telephone Encounter (Signed)
Patient contacted regarding discharge from [facility] on August 04, 2015.  Patient understands to follow up with provider Melina Copa, PA-C on August 12, 2015 at 10:30AM at [facility]. Patient understands discharge instructions? yes Patient understands medications and regiment? yes Patient understands to bring all medications to this visit? yes

## 2015-08-12 ENCOUNTER — Encounter: Payer: Self-pay | Admitting: Physician Assistant

## 2015-08-12 ENCOUNTER — Ambulatory Visit (INDEPENDENT_AMBULATORY_CARE_PROVIDER_SITE_OTHER): Payer: Medicare Other | Admitting: Physician Assistant

## 2015-08-12 ENCOUNTER — Other Ambulatory Visit: Payer: Medicare Other | Admitting: *Deleted

## 2015-08-12 ENCOUNTER — Encounter (INDEPENDENT_AMBULATORY_CARE_PROVIDER_SITE_OTHER): Payer: Self-pay

## 2015-08-12 VITALS — BP 120/80 | HR 67 | Ht 68.0 in | Wt 193.1 lb

## 2015-08-12 DIAGNOSIS — I480 Paroxysmal atrial fibrillation: Secondary | ICD-10-CM

## 2015-08-12 DIAGNOSIS — E1159 Type 2 diabetes mellitus with other circulatory complications: Secondary | ICD-10-CM

## 2015-08-12 DIAGNOSIS — N289 Disorder of kidney and ureter, unspecified: Secondary | ICD-10-CM

## 2015-08-12 DIAGNOSIS — I25810 Atherosclerosis of coronary artery bypass graft(s) without angina pectoris: Secondary | ICD-10-CM

## 2015-08-12 DIAGNOSIS — I10 Essential (primary) hypertension: Secondary | ICD-10-CM | POA: Diagnosis not present

## 2015-08-12 DIAGNOSIS — E785 Hyperlipidemia, unspecified: Secondary | ICD-10-CM

## 2015-08-12 LAB — BASIC METABOLIC PANEL
BUN: 13 mg/dL (ref 7–25)
CHLORIDE: 102 mmol/L (ref 98–110)
CO2: 27 mmol/L (ref 20–31)
Calcium: 9.4 mg/dL (ref 8.6–10.3)
Creat: 1 mg/dL (ref 0.70–1.18)
GLUCOSE: 258 mg/dL — AB (ref 65–99)
POTASSIUM: 4.3 mmol/L (ref 3.5–5.3)
SODIUM: 136 mmol/L (ref 135–146)

## 2015-08-12 NOTE — Progress Notes (Signed)
Cardiology Office Note    Date:  08/12/2015   ID:  [name], DOB September 03, 1945, MRN [id number]  PCP:  No PCP Per Patient  Primary Cardiologist: Dr. Irish Lack Electrophysiologist: Dr. Rayann Heman  Chief Complaint: Hospital follow up s/p   NSTEMI   History of Present Illness:   [name] is a 70 y.o. male  with PMH of HTN, HLD, DM II, PAF on eliquis s/p MAZE and LAA clip, and CAD s/p CABG x 5 (LIMA to LAD, SVG to diag/ramus, SVG to PDA/PLA) and recently admitted for NSTEMI who presented for follow up.   Patient did have LAA clip and MAZE during CABG. Dr. Irish Lack referred the patient to Dr. Rayann Heman who saw thepatient on 03/24/2015, various option has been discussed with patient including: 1.resuming eliquis, 2. stopping eliquis, 3. testA. fib burden with loop recorder before stopping eliquis, and 4.potentially obtain TEE to ensure adequate closure of left atrial appendage clipping before stopping eliquis. Patient opted to continue eliquis since then.  Presented to ER 08/02/15 for exertional chest pain and relived with rest. In ER troponin was 0.22 with minimally elevated creatinine of 1.3. Hydrated overnight and Eliquis was held. He underwent cardiac catheterization on 08/03/2015 which showed severe three-vessel CAD, patent LIMA to LAD, discrete 90% stenosis in distal sequential SVG to PDA and PLA which is not good PCI target, ostial occlusion of sequential SVG to diagonal and ramus, severe global LV dysfunction with EF 20-25%. Given the decreased ejection fraction, originally it was planned for the patient to start on lifevest and reassess EF in 2 months. However, echocardiogram came back showing EF has improved to 50-55%. Lifevest has been canceled. After discuss with Dr. Radford Pax who has over read the image and felt that the ejection fraction is about 45%. Plavix was stopped as patient will require aspirin and eliquis.   Presented today for follow up. No chest pain or sob. Feeling well. No  orthopnea, PND, syncope , LE edema, melena or blood in his stool or urine.    Past Medical History:  Diagnosis Date  . Arthritis    "fingers, shoulders" (08/03/2015)  . Atrial fibrillation (HCC)    Paroxysmal, rare episodes, sinus rhythm on flecainide, patient prefers not to take Coumadin  . Bradycardia    April, 2013  . Chest pain    Nuclear, 2006, no ischemia  . Chronic lower back pain    "all my life"  . Coronary artery disease    s/p staged cath 05/12/2014 and 5/11, DES x 2 to heavily calcified RCA, residual with 60% prox LAD, 70% D1  . Ejection fraction    EF 55-60%,  echo, January, 2011  . Elevated CPK    CPK elevated with normal MB and normal troponin the past  . Heart murmur   . History of echocardiogram    Echo 8/16: EF 55%, normal wall motion, grade 2 diastolic dysfunction, trivial MR, normal RV function, PASP 27 mmHg  . Hypertension   . IBS (irritable bowel syndrome)   . Myocardial infarction ([facility]) 06/2014  . Sinus drainage   . Type II diabetes mellitus ([facility])     Past Surgical History:  Procedure Laterality Date  . APPENDECTOMY    . CARDIAC CATHETERIZATION N/A 05/12/2014   Procedure: Left Heart Cath and Coronary Angiography;  Surgeon: Troy Sine, MD;  Location: [facility];  Service: Cardiovascular;  Laterality: N/A;  . CARDIAC CATHETERIZATION N/A 05/13/2014   Procedure: Coronary/Graft Atherectomy;  Surgeon: Joyice Faster  Claiborne Billings, MD;  Location: [facility];  Service: Cardiovascular;  Laterality: N/A;  . CARDIAC CATHETERIZATION Right 05/13/2014   Procedure: Temporary Pacemaker;  Surgeon: Troy Sine, MD;  Location: [facility];  Service: Cardiovascular;  Laterality: Right;  . CARDIAC CATHETERIZATION N/A 05/13/2014   Procedure: Coronary Stent Intervention;  Surgeon: Troy Sine, MD;  Location: [facility];  Service: Cardiovascular;  Laterality: N/A;  . CARDIAC CATHETERIZATION N/A 06/18/2014   Procedure: Left Heart Cath and Coronary  Angiography;  Surgeon: Peter M Martinique, MD;  Location: [facility];  Service: Cardiovascular;  Laterality: N/A;  . CARDIAC CATHETERIZATION N/A 08/03/2015   Procedure: Left Heart Cath and Cors/Grafts Angiography;  Surgeon: Nelva Bush, MD;  Location: [facility];  Service: Cardiovascular;  Laterality: N/A;  . CARDIOVASCULAR STRESS TEST  05/06/2014  . CATARACT EXTRACTION W/ INTRAOCULAR LENS IMPLANT Left   . CORONARY ARTERY BYPASS GRAFT N/A 06/22/2014   Procedure: CORONARY ARTERY BYPASS GRAFTING (CABG) x five, using left internal mammary artery, and right leg greater saphenous vein harvested endoscopically;  Surgeon: Melrose Nakayama, MD;  Location: [facility];  Service: Open Heart Surgery;  Laterality: N/A;  . LAPAROSCOPIC CHOLECYSTECTOMY    . MAZE N/A 06/22/2014   Procedure: MAZE;  Surgeon: Melrose Nakayama, MD;  Location: [facility];  Service: Open Heart Surgery;  Laterality: N/A;  . TEE WITHOUT CARDIOVERSION N/A 06/22/2014   Procedure: TRANSESOPHAGEAL ECHOCARDIOGRAM (TEE);  Surgeon: Melrose Nakayama, MD;  Location: [facility];  Service: Open Heart Surgery;  Laterality: N/A;    Current Medications: Prior to Admission medications   Medication Sig Start Date End Date Taking? Authorizing Provider  apixaban (ELIQUIS) 5 MG TABS tablet Take 1 tablet (5 mg total) by mouth 2 (two) times daily. 06/16/15   Jettie Booze, MD  aspirin 81 MG tablet Take 81 mg by mouth daily.    Historical Provider, MD  carvedilol (COREG) 6.25 MG tablet Take 1 tablet (6.25 mg total) by mouth 2 (two) times daily with a meal. 08/04/15   Almyra Deforest, PA  dicyclomine (BENTYL) 20 MG tablet Take 1 tablet (20 mg total) by mouth 4 (four) times daily -  before meals and at bedtime. Patient taking differently: Take 20 mg by mouth 2 (two) times daily.  08/04/14   Leandrew Koyanagi, MD  fluorometholone (FML) 0.1 % ophthalmic suspension Place 1 drop into the right eye daily as needed (for eye redness and itching).  06/04/15    Historical Provider, MD  fluticasone (FLONASE) 50 MCG/ACT nasal spray Place 1 spray into both nostrils as needed for allergies or rhinitis. 03/10/15   Leandrew Koyanagi, MD  glyBURIDE-metformin (GLUCOVANCE) 2.5-500 MG tablet Take 1 tablet by mouth 2 (two) times daily with a meal. 12/11/14   Leandrew Koyanagi, MD  ibuprofen (ADVIL,MOTRIN) 200 MG tablet Take 400 mg by mouth every morning.    Historical Provider, MD  isosorbide mononitrate (IMDUR) 30 MG 24 hr tablet Take 1 tablet (30 mg total) by mouth daily. 08/04/15   Almyra Deforest, PA  lisinopril (PRINIVIL,ZESTRIL) 5 MG tablet Take 1 tablet (5 mg total) by mouth daily. 08/04/15   Almyra Deforest, PA  MAGNESIUM PO Take 1 tablet by mouth daily as needed (for leg cramping).     Historical Provider, MD  nitroGLYCERIN (NITROSTAT) 0.4 MG SL tablet Place 1 tablet (0.4 mg total) under the tongue every 5 (five) minutes as needed for chest pain. 02/25/15   Charlann Lange  Irish Lack, MD  rosuvastatin (CRESTOR) 10 MG tablet Take 1 tablet (10 mg total) by mouth daily. 08/04/15   Almyra Deforest, PA  tamsulosin (FLOMAX) 0.4 MG CAPS capsule Take 0.4 mg by mouth as needed. For urination problems    Historical Provider, MD  TRULICITY [id number] SOPN Inject 0.75 mg as directed every Wednesday.  10/29/14   Historical Provider, MD    Allergies:   Lipitor [atorvastatin]; Pravastatin; and Simvastatin   Social History   Social History  . Marital status: Married    Spouse name: N/A  . Number of children: N/A  . Years of education: N/A   Social History Main Topics  . Smoking status: Never Smoker  . Smokeless tobacco: Never Used  . Alcohol use No  . Drug use: No  . Sexual activity: Not Asked   Other Topics Concern  . None   Social History Narrative  . None     Family History:  The patient's family history includes Alzheimer's disease in his mother; Arrhythmia in his sister; Cancer in his brother; Emphysema in his father; Heart attack in his sister; Heart disease in his sister;  Hyperlipidemia in his sister; Hypertension in his sister.   ROS:   Please see the history of present illness.    ROS All other systems reviewed and are negative.   PHYSICAL EXAM:   VS:  BP 120/80 (BP Location: Left Arm, Patient Position: Sitting, Cuff Size: Normal)   Pulse 67   Ht 5\' 8"  (1.727 m)   Wt 193 lb 1.9 oz (87.6 kg)   BMI 29.36 kg/m    GEN: Well nourished, well developed, in no acute distress  HEENT: normal  Neck: no JVD, carotid bruits, or masses Cardiac: RRR; no murmurs, rubs, or gallops,no edema L radial cath site without hematoma or bruise Respiratory:  clear to auscultation bilaterally, normal work of breathing GI: soft, nontender, nondistended, + BS MS: no deformity or atrophy  Skin: warm and dry, no rash Neuro:  Alert and Oriented x 3, Strength and sensation are intact Psych: euthymic mood, full affect  Wt Readings from Last 3 Encounters:  08/12/15 193 lb 1.9 oz (87.6 kg)  08/02/15 194 lb 0.1 oz (88 kg)  03/24/15 192 lb 3.2 oz (87.2 kg)      Studies/Labs Reviewed:   EKG:  EKG is ordered today.  The ekg ordered today demonstrates sinus rhythm with PACs and PVCs. HR 67  Recent Labs: 03/10/2015: ALT 36 08/04/2015: BUN 17; Creatinine, Ser 1.00; Hemoglobin 13.5; Platelets 172; Potassium 3.8; Sodium 136   Lipid Panel    Component Value Date/Time   CHOL 167 08/03/2015 0248   TRIG 160 (H) 08/03/2015 0248   HDL 39 (L) 08/03/2015 0248   CHOLHDL 4.3 08/03/2015 0248   VLDL 32 08/03/2015 0248   LDLCALC 96 08/03/2015 0248    Additional studies/ records that were reviewed today include:  Cath 08/03/2015 Procedures   Left Heart Cath and Cors/Grafts Angiography  Conclusion   1. Severe 3-vessel native coronary artery disease. 2. Patent LIMA to LAD. 3. Patent but small and diffusely diseased sequential SVG to PDA and PL with discrete 90% stenosis in the distal SVG. 4. Ostially occluded sequential SVG to diagonal and ramus. 5. Severe global LV dysfunction  with LVEF 20-25%. 6. Mildly elevated left ventricular filling pressure.  Recommendations: 1. Medical management of severe multivessel coronary artery disease. Small, diffusely diseased SVG to PDA and PL branch is not a good PCI target. 2. Transthoracic echocardiogram to  further assess LV structure. 3. Aggressive medical optimization of coronary artery disease and severe systolic heart failure.    Echo 08/04/2015 LV EF: 50% - 55%  ------------------------------------------------------------------- Indications: Cardiomyopathy - ischemic 414.8.  ------------------------------------------------------------------- History: PMH: Murmur. Atrial fibrillation. Coronary artery disease. PMH: Myocardial infarction. Risk factors: Diabetes mellitus.  ------------------------------------------------------------------- Study Conclusions  - Left ventricle: Inferobasal hypokinesis. The cavity size was normal. Systolic function was normal. The estimated ejection fraction was in the range of 50% to 55%. Wall motion was normal; there were no regional wall motion abnormalities. Left ventricular diastolic function parameters were normal. - Mitral valve: There was mild regurgitation. - Left atrium: The atrium was mildly dilated. - Atrial septum: No defect or patent foramen ovale was identified.    ASSESSMENT & PLAN:    1. CAD s/p CABG x 5 (LIMA to LAD, SVG to diag/ramus, SVG to PDA/PLA ) - Cath yesterday showed severe 3 vessel ASCAD with patent LIMA to LAD, patent but small and diffusely diseased SVG to PDA and PL with 90% stenosis of distal SVG, ostially occluded sequential SVG to diag and ramus and severe LV dysfunction with EF 20-25%.  Medical management recommended.  -  However, echocardiogram came back showing EF has improved to 50-55%. Lifevest has been canceled. After discuss with Dr. Radford Pax who has over read the image and felt that the ejection fraction is  about 45%.  - No chest pain or SOB. Continue ASA, BB, Imdur and statin. Off plavix as need of anticoagulation.   2. ICM - Cath and echo result as above. He is euvolemic on exam. Continue BB and ACE. Discussed HF symptoms and salt restriction in detail. He was adding extra salt on food.   3. AKI - ACE added during admission. BMET today as previously scheduled.   4. PAF s/p MAZE and LAA clip - Maintaining sinus rhythm at controlled rate. PACs and PVCs on EKG. Continue BB and Eliquis. CHA2DS2-Vasc score 4 (HTN, DM, age, CAD)  62. HLD - 08/03/2015: Cholesterol 167; HDL 39; LDL Cholesterol 96; Triglycerides 160; VLDL 32  - Continue statin. Will need Lipid panel and LFT during next office visit.   6. Uncontrolled DM - A1c of 11.2 during admission. Has appointment with PCP later this month. Discussed dietary restriction.    Medication Adjustments/Labs and Tests Ordered: Current medicines are reviewed at length with the patient today.  Concerns regarding medicines are outlined above.  Medication changes, Labs and Tests ordered today are listed in the Patient Instructions below. Patient Instructions  Medication Instructions:  Your physician recommends that you continue on your current medications as directed. Please refer to the Current Medication list given to you today.   Labwork: None ordered  Testing/Procedures: None ordered  Follow-Up: Your physician recommends that you schedule a follow-up appointment in: SEE DR. VARANASI AS PLANNED   Any Other Special Instructions Will Be Listed Below (If Applicable).     If you need a refill on your cardiac medications before your next appointment, please call your pharmacy.      Jarrett Soho, [place]  08/12/2015 10:55 AM    [facility] [address], [place], [place]  [address] Phone: [phone]; Fax: [phone]

## 2015-08-12 NOTE — Patient Instructions (Addendum)
Medication Instructions:  Your physician recommends that you continue on your current medications as directed. Please refer to the Current Medication list given to you today.   Labwork: None ordered  Testing/Procedures: None ordered  Follow-Up: Your physician recommends that you schedule a follow-up appointment in: SEE DR. VARANASI AS PLANNED   Any Other Special Instructions Will Be Listed Below (If Applicable).     If you need a refill on your cardiac medications before your next appointment, please call your pharmacy.

## 2015-08-24 ENCOUNTER — Telehealth: Payer: Self-pay | Admitting: Primary Care

## 2015-08-24 ENCOUNTER — Encounter: Payer: Self-pay | Admitting: Primary Care

## 2015-08-24 ENCOUNTER — Ambulatory Visit (INDEPENDENT_AMBULATORY_CARE_PROVIDER_SITE_OTHER): Payer: Medicare Other | Admitting: Primary Care

## 2015-08-24 VITALS — BP 126/76 | HR 70 | Temp 97.7°F | Ht 68.0 in | Wt 192.1 lb

## 2015-08-24 DIAGNOSIS — I214 Non-ST elevation (NSTEMI) myocardial infarction: Secondary | ICD-10-CM | POA: Diagnosis not present

## 2015-08-24 DIAGNOSIS — I200 Unstable angina: Secondary | ICD-10-CM | POA: Diagnosis not present

## 2015-08-24 DIAGNOSIS — I480 Paroxysmal atrial fibrillation: Secondary | ICD-10-CM | POA: Diagnosis not present

## 2015-08-24 DIAGNOSIS — E118 Type 2 diabetes mellitus with unspecified complications: Secondary | ICD-10-CM

## 2015-08-24 DIAGNOSIS — N400 Enlarged prostate without lower urinary tract symptoms: Secondary | ICD-10-CM | POA: Diagnosis not present

## 2015-08-24 DIAGNOSIS — E785 Hyperlipidemia, unspecified: Secondary | ICD-10-CM

## 2015-08-24 DIAGNOSIS — K589 Irritable bowel syndrome without diarrhea: Secondary | ICD-10-CM

## 2015-08-24 MED ORDER — TAMSULOSIN HCL 0.4 MG PO CAPS: 0.4000 mg | ORAL_CAPSULE | ORAL | 5 refills | Status: DC | PRN

## 2015-08-24 MED ORDER — PEN NEEDLES 31G X 6 MM MISC: 5 refills | Status: DC

## 2015-08-24 MED ORDER — INSULIN GLARGINE 100 UNIT/ML SOLOSTAR PEN
5.0000 [IU] | PEN_INJECTOR | Freq: Every evening | SUBCUTANEOUS | 11 refills | Status: DC
Start: 2015-08-24 — End: 2015-09-24

## 2015-08-24 MED ORDER — INSULIN GLARGINE 100 UNIT/ML SOLOSTAR PEN: 5.0000 [IU] | PEN_INJECTOR | Freq: Every evening | SUBCUTANEOUS | 11 refills | Status: DC

## 2015-08-24 MED ORDER — INSULIN PEN NEEDLE 31G X 8 MM MISC: 5 refills | Status: AC

## 2015-08-24 MED ORDER — GLYBURIDE-METFORMIN 2.5-500 MG PO TABS: 2.0000 | ORAL_TABLET | Freq: Two times a day (BID) | ORAL | 3 refills | Status: DC

## 2015-08-24 NOTE — Patient Instructions (Addendum)
I've increased the dose of of your glyburide-metformin. Start taking 2 tablets by mouth twice daily.  Start Lantus long acting insulin. Inject 5 units into the skin every evening at bedtime.  Continue to check your blood sugars before meals and at bedtime. Record your readings and bring them to your next visit.  Follow up in 1 month for re-evaluation of blood sugars.  It was a pleasure to meet you today! Please don't hesitate to call me with any questions. Welcome to [facility]!  Diabetes Mellitus and Food It is important for you to manage your blood sugar (glucose) level. Your blood glucose level can be greatly affected by what you eat. Eating healthier foods in the appropriate amounts throughout the day at about the same time each day will help you control your blood glucose level. It can also help slow or prevent worsening of your diabetes mellitus. Healthy eating may even help you improve the level of your blood pressure and reach or maintain a healthy weight.  General recommendations for healthful eating and cooking habits include:  Eating meals and snacks regularly. Avoid going long periods of time without eating to lose weight.  Eating a diet that consists mainly of plant-based foods, such as fruits, vegetables, nuts, legumes, and whole grains.  Using low-heat cooking methods, such as baking, instead of high-heat cooking methods, such as deep frying. Work with your dietitian to make sure you understand how to use the Nutrition Facts information on food labels. HOW CAN FOOD AFFECT ME? Carbohydrates Carbohydrates affect your blood glucose level more than any other type of food. Your dietitian will help you determine how many carbohydrates to eat at each meal and teach you how to count carbohydrates. Counting carbohydrates is important to keep your blood glucose at a healthy level, especially if you are using insulin or taking certain medicines for diabetes mellitus. Alcohol Alcohol can  cause sudden decreases in blood glucose (hypoglycemia), especially if you use insulin or take certain medicines for diabetes mellitus. Hypoglycemia can be a life-threatening condition. Symptoms of hypoglycemia (sleepiness, dizziness, and disorientation) are similar to symptoms of having too much alcohol.  If your health care provider has given you approval to drink alcohol, do so in moderation and use the following guidelines:  Women should not have more than one drink per day, and men should not have more than two drinks per day. One drink is equal to:  12 oz of beer.  5 oz of wine.  1 oz of hard liquor.  Do not drink on an empty stomach.  Keep yourself hydrated. Have water, diet soda, or unsweetened iced tea.  Regular soda, juice, and other mixers might contain a lot of carbohydrates and should be counted. WHAT FOODS ARE NOT RECOMMENDED? As you make food choices, it is important to remember that all foods are not the same. Some foods have fewer nutrients per serving than other foods, even though they might have the same number of calories or carbohydrates. It is difficult to get your body what it needs when you eat foods with fewer nutrients. Examples of foods that you should avoid that are high in calories and carbohydrates but low in nutrients include:  Trans fats (most processed foods list trans fats on the Nutrition Facts label).  Regular soda.  Juice.  Candy.  Sweets, such as cake, pie, doughnuts, and cookies.  Fried foods. WHAT FOODS CAN I EAT? Eat nutrient-rich foods, which will nourish your body and keep you healthy. The food you  should eat also will depend on several factors, including:  The calories you need.  The medicines you take.  Your weight.  Your blood glucose level.  Your blood pressure level.  Your cholesterol level. You should eat a variety of foods, including:  Protein.  Lean cuts of meat.  Proteins low in saturated fats, such as fish, egg  whites, and beans. Avoid processed meats.  Fruits and vegetables.  Fruits and vegetables that may help control blood glucose levels, such as apples, mangoes, and yams.  Dairy products.  Choose fat-free or low-fat dairy products, such as milk, yogurt, and cheese.  Grains, bread, pasta, and rice.  Choose whole grain products, such as multigrain bread, whole oats, and brown rice. These foods may help control blood pressure.  Fats.  Foods containing healthful fats, such as nuts, avocado, olive oil, canola oil, and fish. DOES EVERYONE WITH DIABETES MELLITUS HAVE THE SAME MEAL PLAN? Because every person with diabetes mellitus is different, there is not one meal plan that works for everyone. It is very important that you meet with a dietitian who will help you create a meal plan that is just right for you.   This information is not intended to replace advice given to you by your health care provider. Make sure you discuss any questions you have with your health care provider.   Document Released: 09/15/2004 Document Revised: 01/09/2014 Document Reviewed: 11/15/2012 Elsevier Interactive Patient Education [employer].

## 2015-08-24 NOTE — Assessment & Plan Note (Signed)
Diagnosed years ago. Colonoscopy in 2010, due again in 2020. Managed on Bentyl PRN. Will continue to monitor.

## 2015-08-24 NOTE — Assessment & Plan Note (Signed)
Uses Flomax PRN. Refill provided today.

## 2015-08-24 NOTE — Telephone Encounter (Signed)
Spoken to patient and notified patient that the medication has been sent to [facility]. Patient verbalized understanding.

## 2015-08-24 NOTE — Telephone Encounter (Signed)
Pt called and has received 2 medications at the [facility], but they did not receive the glyBURIDE-metformin (GLUCOVANCE) 2.5-500 MG tablet [[id number]]  Order Details   Please send to the [facility] and pt is requesting CB

## 2015-08-24 NOTE — Assessment & Plan Note (Signed)
Uncontrolled. Recent A1C of 11.2. Stressed importance of good glycemic control, especially given recent MI. Increase Glyburide-Metformin to 5 mg-1000mg  BID. Start Lantus 5 units HS. Will have him monitor sugars before meals and HS. Follow up in 1 month with sugar logs.

## 2015-08-24 NOTE — Progress Notes (Signed)
Pre visit review using our clinic review tool, if applicable. No additional management support is needed unless otherwise documented below in the visit note. 

## 2015-08-24 NOTE — Assessment & Plan Note (Signed)
July 31st 2017, also in June 2016. Underwent cardiac rehab, is now exercising at his local gym. Discussed importance of blood glucose control, healthy diet, etc. Denies chest pain, weakness, shortness of breath. Continue statin, beta blocker, Imdur, nitro PRN. Follows with cardiology.

## 2015-08-24 NOTE — Assessment & Plan Note (Signed)
Managed on Crestor 10 mg. Tolerating well. Recent LFT's unremarkable. Recent lipid panel overall stable.

## 2015-08-24 NOTE — Progress Notes (Signed)
Subjective:    Patient ID: [name], male    DOB: Dec 08, 1945, 70 y.o.   MRN: [id number]  HPI  [name] is a 70 year old male who presents today to establish care and discuss the problems mentioned below. Will review old records.  1) Type 2 Diabetes: Currently managed on glyburide-metformin 2.5-500 mg (1.5 tablets) BID and Trulicity 8.18 EX/9.3ZJ. Last A1C of 11.2 in July 2017. He cannot afford Trulicity and has not taken since early 2017, but endorses this worked well in the past to control his sugars. He checks his sugars every morning fasting which are run between 180's-250's and in the evening before bed which run 220-300. He has been exercising through cardiac rehab and is also golfing several times weekly. He is working to improve his diet by limiting junk food. Denies numbness/tinlging, dizziness, visual changes.  2) Paroxysmal Atrial Fibrillation: Prior history of. Recently underwent a Maze Procedure in 2016. Currently managed on Eliquis 5 mg. Follows with Cardiology.   3) CAD/NSTEMI/Essential Hypertension: Currently managed on Aspirin, carvedilol 6.25 mg, Imdur, nitroglycerin, Lisinopril 5 mg, Crestor 10 mg. NSTEMI in June 2016 and again July 31st 2017. Recent echocardiogram with EF of 50-55% with normal left ventricular systolic function, mild mitral valve regurgitation. Cardiac catheterization in 2017 with severe multivessel CAD. Follows with Cardiology. Denies chest pain, shortness of breath, dizziness, weakness.  4) BPH: Diagnosed years ago. Currently managed on Flomax 0.4 mg as needed but is out of medication. He's not had this medication in 1 year. He's noticed difficulty urinating over the past 1 month and is requesting a refill.   5) IBS: Diagnosed years ago. Currently managed on Dicyclomine 20 mg and will take it as needed once to twice weekly on average. Colonoscopy completed in 2010.  Review of Systems  Constitutional: Negative for fatigue.  Eyes: Negative for visual  disturbance.  Respiratory: Negative for shortness of breath.   Cardiovascular: Negative for chest pain.  Gastrointestinal:       IBS, uses Bentyl 1-2 times weekly.  Genitourinary: Negative for dysuria and flank pain.       History of BPH. Occasional difficulty urinating.  Musculoskeletal: Negative for myalgias.  Skin: Negative for rash.  Allergic/Immunologic: Negative for environmental allergies.  Neurological: Negative for dizziness, weakness and numbness.       Past Medical History:  Diagnosis Date  . Arthritis    "fingers, shoulders" (08/03/2015)  . Atrial fibrillation (HCC)    Paroxysmal, rare episodes, sinus rhythm on flecainide, patient prefers not to take Coumadin  . Bradycardia    April, 2013  . Chest pain    Nuclear, 2006, no ischemia  . Chronic lower back pain    "all my life"  . Coronary artery disease    s/p staged cath 05/12/2014 and 5/11, DES x 2 to heavily calcified RCA, residual with 60% prox LAD, 70% D1  . Ejection fraction    EF 55-60%,  echo, January, 2011  . Elevated CPK    CPK elevated with normal MB and normal troponin the past  . Heart murmur   . History of echocardiogram    Echo 8/16: EF 55%, normal wall motion, grade 2 diastolic dysfunction, trivial MR, normal RV function, PASP 27 mmHg  . Hypertension   . IBS (irritable bowel syndrome)   . Myocardial infarction ([place]) 06/2014  . Sinus drainage   . Type II diabetes mellitus ([place])      Social History   Social History  . Marital  status: Married    Spouse name: N/A  . Number of children: N/A  . Years of education: N/A   Occupational History  . Not on file.   Social History Main Topics  . Smoking status: Never Smoker  . Smokeless tobacco: Never Used  . Alcohol use No  . Drug use: No  . Sexual activity: Not on file   Other Topics Concern  . Not on file   Social History Narrative  . No narrative on file    Past Surgical History:  Procedure Laterality Date  . APPENDECTOMY    . CARDIAC  CATHETERIZATION N/A 05/12/2014   Procedure: Left Heart Cath and Coronary Angiography;  Surgeon: Troy Sine, MD;  Location: [facility] [facility];  Service: Cardiovascular;  Laterality: N/A;  . CARDIAC CATHETERIZATION N/A 05/13/2014   Procedure: Coronary/Graft Atherectomy;  Surgeon: Troy Sine, MD;  Location: [place] [facility];  Service: Cardiovascular;  Laterality: N/A;  . CARDIAC CATHETERIZATION Right 05/13/2014   Procedure: Temporary Pacemaker;  Surgeon: Troy Sine, MD;  Location: [place] [facility];  Service: Cardiovascular;  Laterality: Right;  . CARDIAC CATHETERIZATION N/A 05/13/2014   Procedure: Coronary Stent Intervention;  Surgeon: Troy Sine, MD;  Location: [place] [facility];  Service: Cardiovascular;  Laterality: N/A;  . CARDIAC CATHETERIZATION N/A 06/18/2014   Procedure: Left Heart Cath and Coronary Angiography;  Surgeon: Peter M Martinique, MD;  Location: [place] [facility];  Service: Cardiovascular;  Laterality: N/A;  . CARDIAC CATHETERIZATION N/A 08/03/2015   Procedure: Left Heart Cath and Cors/Grafts Angiography;  Surgeon: Nelva Bush, MD;  Location: [place] [facility];  Service: Cardiovascular;  Laterality: N/A;  . CARDIOVASCULAR STRESS TEST  05/06/2014  . CATARACT EXTRACTION W/ INTRAOCULAR LENS IMPLANT Left   . CORONARY ARTERY BYPASS GRAFT N/A 06/22/2014   Procedure: CORONARY ARTERY BYPASS GRAFTING (CABG) x five, using left internal mammary artery, and right leg greater saphenous vein harvested endoscopically;  Surgeon: Melrose Nakayama, MD;  Location: [facility] [facility];  Service: Open Heart Surgery;  Laterality: N/A;  . LAPAROSCOPIC CHOLECYSTECTOMY    . MAZE N/A 06/22/2014   Procedure: MAZE;  Surgeon: Melrose Nakayama, MD;  Location: [place];  Service: Open Heart Surgery;  Laterality: N/A;  . TEE WITHOUT CARDIOVERSION N/A 06/22/2014   Procedure: TRANSESOPHAGEAL ECHOCARDIOGRAM (TEE);  Surgeon: Melrose Nakayama, MD;  Location: [place];  Service: Open Heart Surgery;  Laterality:  N/A;    Family History  Problem Relation Age of Onset  . Alzheimer's disease Mother   . Emphysema Father   . Cancer Brother   . Arrhythmia Sister   . Heart attack Sister   . Heart disease Sister   . Hyperlipidemia Sister   . Hypertension Sister     Allergies  Allergen Reactions  . Lipitor [Atorvastatin] Other (See Comments)    Myopathy Spring 2006  . Pravastatin Other (See Comments)    LEG CRAMPS  . Simvastatin Other (See Comments)    LEG CRAMPS    Current Outpatient Prescriptions on File Prior to Visit  Medication Sig Dispense Refill  . apixaban (ELIQUIS) 5 MG TABS tablet Take 1 tablet (5 mg total) by mouth 2 (two) times daily. 60 tablet 8  . aspirin 81 MG tablet Take 81 mg by mouth daily.    . carvedilol (COREG) 6.25 MG tablet Take 1 tablet (6.25 mg total) by mouth 2 (two) times daily with a meal. 60 tablet 11  . dicyclomine (BENTYL) 20 MG tablet Take  1 tablet (20 mg total) by mouth 4 (four) times daily -  before meals and at bedtime. (Patient taking differently: Take 20 mg by mouth 2 (two) times daily. ) 120 tablet 2  . fluorometholone (FML) 0.1 % ophthalmic suspension Place 1 drop into the right eye daily as needed (for eye redness and itching).     . fluticasone (FLONASE) 50 MCG/ACT nasal spray Place 1 spray into both nostrils as needed for allergies or rhinitis. 16 g 11  . ibuprofen (ADVIL,MOTRIN) 200 MG tablet Take 200 mg by mouth daily as needed for headache or moderate pain.     . isosorbide mononitrate (IMDUR) 30 MG 24 hr tablet Take 1 tablet (30 mg total) by mouth daily. 90 tablet 3  . lisinopril (PRINIVIL,ZESTRIL) 5 MG tablet Take 1 tablet (5 mg total) by mouth daily. 90 tablet 3  . MAGNESIUM PO Take 1 tablet by mouth daily as needed (for leg cramping).     . rosuvastatin (CRESTOR) 10 MG tablet Take 1 tablet (10 mg total) by mouth daily. 30 tablet 11  . nitroGLYCERIN (NITROSTAT) 0.4 MG SL tablet Place 1 tablet (0.4 mg total) under the tongue every 5 (five) minutes as  needed for chest pain. (Patient not taking: Reported on 08/24/2015) 25 tablet 3  . TRULICITY 3.01 OF/9.6LG SOPN Inject 0.75 mg as directed every Wednesday.   1   No current facility-administered medications on file prior to visit.     BP 126/76   Pulse 70   Temp 97.7 F (36.5 C) (Oral)   Ht 5' 8"  (1.727 m)   Wt 192 lb 1.9 oz (87.1 kg)   SpO2 98%   BMI 29.21 kg/m    Objective:   Physical Exam  Constitutional: He is oriented to person, place, and time. He appears well-nourished.  Neck: Neck supple.  Cardiovascular: Normal rate and regular rhythm.   Pulmonary/Chest: Effort normal and breath sounds normal.  Abdominal: Soft.  Neurological: He is alert and oriented to person, place, and time.  Skin: Skin is warm and dry.  Psychiatric: He has a normal mood and affect.          Assessment & Plan:  Recent hospitalization notes reviewed.

## 2015-08-24 NOTE — Addendum Note (Signed)
Addended by: Jacqualin Combes on: 08/24/2015 02:03 PM   Modules accepted: Orders

## 2015-08-24 NOTE — Addendum Note (Signed)
Addended by: Jacqualin Combes on: 08/24/2015 12:03 PM   Modules accepted: Orders

## 2015-08-24 NOTE — Assessment & Plan Note (Signed)
Currently managed on Eliquis. Maze procedure completed in 2016. Rate and rhythm regular today. Continue anticoagulation.

## 2015-09-01 DIAGNOSIS — H52223 Regular astigmatism, bilateral: Secondary | ICD-10-CM | POA: Diagnosis not present

## 2015-09-01 DIAGNOSIS — E11319 Type 2 diabetes mellitus with unspecified diabetic retinopathy without macular edema: Secondary | ICD-10-CM | POA: Diagnosis not present

## 2015-09-01 DIAGNOSIS — H2511 Age-related nuclear cataract, right eye: Secondary | ICD-10-CM | POA: Diagnosis not present

## 2015-09-01 DIAGNOSIS — H26051 Posterior subcapsular polar infantile and juvenile cataract, right eye: Secondary | ICD-10-CM | POA: Diagnosis not present

## 2015-09-01 DIAGNOSIS — H5203 Hypermetropia, bilateral: Secondary | ICD-10-CM | POA: Diagnosis not present

## 2015-09-01 DIAGNOSIS — E113293 Type 2 diabetes mellitus with mild nonproliferative diabetic retinopathy without macular edema, bilateral: Secondary | ICD-10-CM | POA: Diagnosis not present

## 2015-09-01 DIAGNOSIS — H524 Presbyopia: Secondary | ICD-10-CM | POA: Diagnosis not present

## 2015-09-01 LAB — HM DIABETES EYE EXAM

## 2015-09-08 ENCOUNTER — Encounter: Payer: Self-pay | Admitting: Interventional Cardiology

## 2015-09-08 ENCOUNTER — Encounter (INDEPENDENT_AMBULATORY_CARE_PROVIDER_SITE_OTHER): Payer: Self-pay

## 2015-09-08 ENCOUNTER — Ambulatory Visit (INDEPENDENT_AMBULATORY_CARE_PROVIDER_SITE_OTHER): Payer: Medicare Other | Admitting: Interventional Cardiology

## 2015-09-08 VITALS — BP 160/60 | HR 81 | Ht 68.0 in | Wt 197.0 lb

## 2015-09-08 DIAGNOSIS — I25810 Atherosclerosis of coronary artery bypass graft(s) without angina pectoris: Secondary | ICD-10-CM

## 2015-09-08 DIAGNOSIS — E785 Hyperlipidemia, unspecified: Secondary | ICD-10-CM

## 2015-09-08 DIAGNOSIS — E1159 Type 2 diabetes mellitus with other circulatory complications: Secondary | ICD-10-CM

## 2015-09-08 DIAGNOSIS — I2510 Atherosclerotic heart disease of native coronary artery without angina pectoris: Secondary | ICD-10-CM

## 2015-09-08 DIAGNOSIS — I200 Unstable angina: Secondary | ICD-10-CM

## 2015-09-08 NOTE — Addendum Note (Signed)
Addended by: Dennie Fetters on: 09/08/2015 02:30 PM   Modules accepted: Orders

## 2015-09-08 NOTE — Patient Instructions (Signed)
**Note De-Identified Jayleene Glaeser Obfuscation** Medication Instructions:  Same-no changes  Labwork: None  Testing/Procedures: Your physician has requested that you have an echocardiogram. Echocardiography is a painless test that uses sound waves to create images of your heart. It provides your doctor with information about the size and shape of your heart and how well your heart's chambers and valves are working. This procedure takes approximately one hour. There are no restrictions for this procedure.    Follow-Up: Your physician wants you to follow-up in: 4 months. You will receive a reminder letter in the mail two months in advance. If you don't receive a letter, please call our office to schedule the follow-up appointment.   Any Other Special Instructions Will Be Listed Below (If Applicable).     If you need a refill on your cardiac medications before your next appointment, please call your pharmacy.

## 2015-09-08 NOTE — Progress Notes (Signed)
Patient ID: [name], male   DOB: [birth year], 70 y.o.   MRN: [id number]     Cardiology Office Note   Date:  09/08/2015   ID:  [name], DOB 10-29-1945, MRN [id number]  PCP:  Sheral Flow, NP    Chief Complaint  Patient presents with  . Follow-up    no sx   Follow-up coronary artery disease  Wt Readings from Last 3 Encounters:  09/08/15 197 lb (89.4 kg)  08/24/15 192 lb 1.9 oz (87.1 kg)  08/12/15 193 lb 1.9 oz (87.6 kg)       History of Present Illness: [name] is a 70 y.o. male  who presents today to follow-up coronary disease.  He had stents in 5/16 with rotational atherectomy.  He then had early restenosis and progression of left sided disease. He had surgery with Dr. Roxan Hockey.  He is post-CABG; after having surgery in June 2016.  He has some toe numbness intermittently. His surgery was June 22, 2014. In addition he had a maze procedure and a left atrial appendage clip was placed. He remains on anticoagulation. During his initial hospitalization his EF was in the 40-45% range.  EF had normalized at 55%. He feels great.  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.  He has occasional leg cramps.  No anginal symptoms. No palpitations like his prior atrial fibrillation. He did have left atrial appendage clipping during his open heart surgery.   He had a NSTEMI in Augist 2017.  EF by cath was low.  EF by echo was 50-55%. SVG to RCA disease present but medically managed.  No angina. He has been walking and playing golf without any problems. Overall, he feels quite well. The symptoms of his non-STEMI were very typical type anginal symptoms and unstable angina.    Past Medical History:  Diagnosis Date  . Arthritis    "fingers, shoulders" (08/03/2015)  . Atrial fibrillation (HCC)    Paroxysmal, rare episodes, sinus rhythm on flecainide, patient prefers not to take Coumadin  . Bradycardia    April, 2013  . Chest pain    Nuclear, 2006, no ischemia  .  Chronic lower back pain    "all my life"  . Coronary artery disease    s/p staged cath 05/12/2014 and 5/11, DES x 2 to heavily calcified RCA, residual with 60% prox LAD, 70% D1  . Ejection fraction    EF 55-60%,  echo, January, 2011  . Elevated CPK    CPK elevated with normal MB and normal troponin the past  . Heart murmur   . History of echocardiogram    Echo 8/16: EF 55%, normal wall motion, grade 2 diastolic dysfunction, trivial MR, normal RV function, PASP 27 mmHg  . Hypertension   . IBS (irritable bowel syndrome)   . Myocardial infarction ([place]) 06/2014  . Sinus drainage   . Type II diabetes mellitus ([place])     Past Surgical History:  Procedure Laterality Date  . APPENDECTOMY    . CARDIAC CATHETERIZATION N/A 05/12/2014   Procedure: Left Heart Cath and Coronary Angiography;  Surgeon: Troy Sine, MD;  Location: [facility];  Service: Cardiovascular;  Laterality: N/A;  . CARDIAC CATHETERIZATION N/A 05/13/2014   Procedure: Coronary/Graft Atherectomy;  Surgeon: Troy Sine, MD;  Location: [place];  Service: Cardiovascular;  Laterality: N/A;  . CARDIAC CATHETERIZATION Right 05/13/2014   Procedure: Temporary Pacemaker;  Surgeon: Troy Sine, MD;  Location: [facility];  Service: Cardiovascular;  Laterality: Right;  . CARDIAC CATHETERIZATION N/A 05/13/2014   Procedure: Coronary Stent Intervention;  Surgeon: Troy Sine, MD;  Location: [place] [facility];  Service: Cardiovascular;  Laterality: N/A;  . CARDIAC CATHETERIZATION N/A 06/18/2014   Procedure: Left Heart Cath and Coronary Angiography;  Surgeon: Peter M Martinique, MD;  Location: [place] [facility];  Service: Cardiovascular;  Laterality: N/A;  . CARDIAC CATHETERIZATION N/A 08/03/2015   Procedure: Left Heart Cath and Cors/Grafts Angiography;  Surgeon: Nelva Bush, MD;  Location: [place] [facility];  Service: Cardiovascular;  Laterality: N/A;  . CARDIOVASCULAR STRESS TEST  05/06/2014  . CATARACT  EXTRACTION W/ INTRAOCULAR LENS IMPLANT Left   . CORONARY ARTERY BYPASS GRAFT N/A 06/22/2014   Procedure: CORONARY ARTERY BYPASS GRAFTING (CABG) x five, using left internal mammary artery, and right leg greater saphenous vein harvested endoscopically;  Surgeon: Melrose Nakayama, MD;  Location: [place];  Service: Open Heart Surgery;  Laterality: N/A;  . LAPAROSCOPIC CHOLECYSTECTOMY    . MAZE N/A 06/22/2014   Procedure: MAZE;  Surgeon: Melrose Nakayama, MD;  Location: [facility];  Service: Open Heart Surgery;  Laterality: N/A;  . TEE WITHOUT CARDIOVERSION N/A 06/22/2014   Procedure: TRANSESOPHAGEAL ECHOCARDIOGRAM (TEE);  Surgeon: Melrose Nakayama, MD;  Location: [place];  Service: Open Heart Surgery;  Laterality: N/A;     Current Outpatient Prescriptions  Medication Sig Dispense Refill  . apixaban (ELIQUIS) 5 MG TABS tablet Take 1 tablet (5 mg total) by mouth 2 (two) times daily. 60 tablet 8  . aspirin 81 MG tablet Take 81 mg by mouth daily.    . carvedilol (COREG) 6.25 MG tablet Take 1 tablet (6.25 mg total) by mouth 2 (two) times daily with a meal. 60 tablet 11  . dicyclomine (BENTYL) 20 MG tablet Take 1 tablet (20 mg total) by mouth 4 (four) times daily -  before meals and at bedtime. (Patient taking differently: Take 20 mg by mouth 2 (two) times daily. ) 120 tablet 2  . fluorometholone (FML) 0.1 % ophthalmic suspension Place 1 drop into the right eye daily as needed (for eye redness and itching).     . fluticasone (FLONASE) 50 MCG/ACT nasal spray Place 1 spray into both nostrils as needed for allergies or rhinitis. 16 g 11  . glyBURIDE-metformin (GLUCOVANCE) 2.5-500 MG tablet Take 2 tablets by mouth 2 (two) times daily with a meal. 180 tablet 3  . ibuprofen (ADVIL,MOTRIN) 200 MG tablet Take 200 mg by mouth daily as needed for headache or moderate pain.     . Insulin Glargine (LANTUS) 100 UNIT/ML Solostar Pen Inject 5 Units into the skin every evening. 15 mL 11  . Insulin Pen Needle 31G X 8 MM  MISC Use as directed with Lantus. 100 each 5  . isosorbide mononitrate (IMDUR) 30 MG 24 hr tablet Take 1 tablet (30 mg total) by mouth daily. 90 tablet 3  . lisinopril (PRINIVIL,ZESTRIL) 5 MG tablet Take 1 tablet (5 mg total) by mouth daily. 90 tablet 3  . MAGNESIUM PO Take 1 tablet by mouth daily as needed (for leg cramping).     . nitroGLYCERIN (NITROSTAT) 0.4 MG SL tablet Place 1 tablet (0.4 mg total) under the tongue every 5 (five) minutes as needed for chest pain. 25 tablet 3  . rosuvastatin (CRESTOR) 10 MG tablet Take 1 tablet (10 mg total) by mouth daily. 30 tablet 11  . tamsulosin (FLOMAX) 0.4 MG CAPS capsule Take  1 capsule (0.4 mg total) by mouth as needed. For difficulty urinating. 30 capsule 5  . TRULICITY 0.73 XT/0.6YI SOPN Inject 0.75 mg as directed every Wednesday.   1   No current facility-administered medications for this visit.     Allergies:   Lipitor [atorvastatin]; Pravastatin; and Simvastatin    Social History:  The patient  reports that he has never smoked. He has never used smokeless tobacco. He reports that he does not drink alcohol or use drugs.   Family History:  The patient's family history includes Alzheimer's disease in his mother; Arrhythmia in his sister; Cancer in his brother; Emphysema in his father; Heart attack in his sister; Heart disease in his sister; Hyperlipidemia in his sister; Hypertension in his sister.    ROS:  Please see the history of present illness.   Otherwise, review of systems are positive for foot pain as noted above.   All other systems are reviewed and negative.    PHYSICAL EXAM: VS:  BP (!) 160/60   Pulse 81   Ht 5' 8"  (1.727 m)   Wt 197 lb (89.4 kg)   SpO2 97%   BMI 29.95 kg/m  , BMI Body mass index is 29.95 kg/m. GEN: Well nourished, well developed, in no acute distress  HEENT: normal  Neck: no JVD, carotid bruits, or masses Cardiac: RRR; no murmurs, rubs, or gallops,no edema  Respiratory:  clear to auscultation bilaterally,  normal work of breathing GI: soft, nontender, nondistended, + BS MS: no deformity or atrophy  Skin: warm and dry, no rash Neuro:  Strength and sensation are intact Psych: euthymic mood, full affect    Recent Labs: 03/10/2015: ALT 36 08/04/2015: Hemoglobin 13.5; Platelets 172 08/12/2015: BUN 13; Creat 1.00; Potassium 4.3; Sodium 136   Lipid Panel    Component Value Date/Time   CHOL 167 08/03/2015 0248   TRIG 160 (H) 08/03/2015 0248   HDL 39 (L) 08/03/2015 0248   CHOLHDL 4.3 08/03/2015 0248   VLDL 32 08/03/2015 0248   LDLCALC 96 08/03/2015 0248     Other studies Reviewed: Additional studies/ records that were reviewed today with results demonstrating: Calf reports reviewed from May and June 2016.   ASSESSMENT AND PLAN:  1. AFib: Currently on Eliquis. Dr. Rayann Heman recommended That he stay on anticoagulation due to his chads score of 4.  2. CAD: No angina.  COntinue aggressive secondary prevention.  Status post bypass surgery.  Occluded SVG to RCA. Occluded native RCA by cath in 8/17.  Plan for echocardiogram in about 4 months. He is on an ACE inhibitor and beta blocker. EF was very low at the time of cath. Was better by echo. He is not having any signs of heart failure. I will review his cardiac cath films to see whether or not he would be a candidate for CTO PCI if necessary. 3. Hyperlipidemia: Lipids well controlled. Continue current lipid-lowering therapy. 4. Toe numbness:  May be related to diabetic neuropathy. 5. Diabetes: I stressed the importance of aggressive diabetes control to help prevent complications such as neuropathy.   Current medicines are reviewed at length with the patient today.  The patient concerns regarding his medicines were addressed.  The following changes have been made:  No change  Labs/ tests ordered today include:  No orders of the defined types were placed in this encounter.   Recommend 150 minutes/week of aerobic exercise Low fat, low carb, high  fiber diet recommended  Disposition:   FU in 6 months  Signed, Larae Grooms, MD  09/08/2015 2:04 PM    [facility] [address], [place], [place]  [address] Phone: [phone]; Fax: [phone]

## 2015-09-20 DIAGNOSIS — H43822 Vitreomacular adhesion, left eye: Secondary | ICD-10-CM | POA: Diagnosis not present

## 2015-09-20 DIAGNOSIS — E113393 Type 2 diabetes mellitus with moderate nonproliferative diabetic retinopathy without macular edema, bilateral: Secondary | ICD-10-CM | POA: Diagnosis not present

## 2015-09-24 ENCOUNTER — Ambulatory Visit (INDEPENDENT_AMBULATORY_CARE_PROVIDER_SITE_OTHER): Payer: Medicare Other | Admitting: Primary Care

## 2015-09-24 ENCOUNTER — Encounter: Payer: Self-pay | Admitting: Primary Care

## 2015-09-24 DIAGNOSIS — I200 Unstable angina: Secondary | ICD-10-CM | POA: Diagnosis not present

## 2015-09-24 DIAGNOSIS — E118 Type 2 diabetes mellitus with unspecified complications: Secondary | ICD-10-CM | POA: Diagnosis not present

## 2015-09-24 MED ORDER — GLYBURIDE-METFORMIN 2.5-500 MG PO TABS: 1.0000 | ORAL_TABLET | Freq: Two times a day (BID) | ORAL | 3 refills | Status: DC

## 2015-09-24 MED ORDER — INSULIN GLARGINE 100 UNIT/ML SOLOSTAR PEN: 8.0000 [IU] | PEN_INJECTOR | Freq: Every evening | SUBCUTANEOUS | 11 refills | Status: DC

## 2015-09-24 NOTE — Assessment & Plan Note (Signed)
Sugars overall improved, but still above goal. Increase Lantus to 8 units HS.  Increase in Metformin has caused diarrhea, will decrease back down to 1 tablet BID. Continue Trulicity. He will continue to monitor sugars and notify if less than 90 or above 250. Discussed importance of diabetic diet.

## 2015-09-24 NOTE — Progress Notes (Signed)
Subjective:    Patient ID: [name], male    DOB: 1945-10-13, 70 y.o.   MRN: [id number]  HPI  [name] is a 70 year old male who presents today for follow up of diabetes.  A1C of 11.2 in late July 2017. Last visit his Glyburide-Metformin was increased to 05-998 mg BID and he was initiated on Lantus 5 units HS. His Trulicity was continued. He was encouraged to follow up today with blood sugar readings.  Since his last visit he's checking his sugars twice daily. His morning sugars fasting are running between mid 100's-mid 200's and evening sugars are running mostly in the mid to high 100's. He has noticed a few numbers in the low 100's several hours after his Lantus dose, so he's been eating a high protein snack before bed.  He's been working to improve his diet by cutting back portion sizes. Denies dizziness, changes in vision, weakness. He had a recent eye evaluation with the retina specialist with unremarkable exam. He has noticed an aggravation to his IBS with diarrhea since an increase in his glyburide-metformin.    Review of Systems  Eyes: Negative for visual disturbance.  Respiratory: Negative for shortness of breath.   Cardiovascular: Negative for chest pain.  Gastrointestinal: Positive for diarrhea. Negative for abdominal pain.  Neurological: Negative for dizziness, weakness and numbness.       Past Medical History:  Diagnosis Date  . Arthritis    "fingers, shoulders" (08/03/2015)  . Atrial fibrillation (HCC)    Paroxysmal, rare episodes, sinus rhythm on flecainide, patient prefers not to take Coumadin  . Bradycardia    April, 2013  . Chest pain    Nuclear, 2006, no ischemia  . Chronic lower back pain    "all my life"  . Coronary artery disease    s/p staged cath 05/12/2014 and 5/11, DES x 2 to heavily calcified RCA, residual with 60% prox LAD, 70% D1  . Ejection fraction    EF 55-60%,  echo, January, 2011  . Elevated CPK    CPK elevated with normal MB and normal  troponin the past  . Heart murmur   . History of echocardiogram    Echo 8/16: EF 55%, normal wall motion, grade 2 diastolic dysfunction, trivial MR, normal RV function, PASP 27 mmHg  . Hypertension   . IBS (irritable bowel syndrome)   . Myocardial infarction ([place]) 06/2014  . Sinus drainage   . Type II diabetes mellitus ([place])      Social History   Social History  . Marital status: Married    Spouse name: N/A  . Number of children: N/A  . Years of education: N/A   Occupational History  . Not on file.   Social History Main Topics  . Smoking status: Never Smoker  . Smokeless tobacco: Never Used  . Alcohol use No  . Drug use: No  . Sexual activity: Not on file   Other Topics Concern  . Not on file   Social History Narrative  . No narrative on file    Past Surgical History:  Procedure Laterality Date  . APPENDECTOMY    . CARDIAC CATHETERIZATION N/A 05/12/2014   Procedure: Left Heart Cath and Coronary Angiography;  Surgeon: Troy Sine, MD;  Location: [facility];  Service: Cardiovascular;  Laterality: N/A;  . CARDIAC CATHETERIZATION N/A 05/13/2014   Procedure: Coronary/Graft Atherectomy;  Surgeon: Troy Sine, MD;  Location: [facility];  Service: Cardiovascular;  Laterality: N/A;  . CARDIAC CATHETERIZATION Right 05/13/2014   Procedure: Temporary Pacemaker;  Surgeon: Troy Sine, MD;  Location: [facility];  Service: Cardiovascular;  Laterality: Right;  . CARDIAC CATHETERIZATION N/A 05/13/2014   Procedure: Coronary Stent Intervention;  Surgeon: Troy Sine, MD;  Location: [facility];  Service: Cardiovascular;  Laterality: N/A;  . CARDIAC CATHETERIZATION N/A 06/18/2014   Procedure: Left Heart Cath and Coronary Angiography;  Surgeon: Peter M Martinique, MD;  Location: [facility];  Service: Cardiovascular;  Laterality: N/A;  . CARDIAC CATHETERIZATION N/A 08/03/2015   Procedure: Left Heart Cath and Cors/Grafts Angiography;  Surgeon:  Nelva Bush, MD;  Location: [facility];  Service: Cardiovascular;  Laterality: N/A;  . CARDIOVASCULAR STRESS TEST  05/06/2014  . CATARACT EXTRACTION W/ INTRAOCULAR LENS IMPLANT Left   . CORONARY ARTERY BYPASS GRAFT N/A 06/22/2014   Procedure: CORONARY ARTERY BYPASS GRAFTING (CABG) x five, using left internal mammary artery, and right leg greater saphenous vein harvested endoscopically;  Surgeon: Melrose Nakayama, MD;  Location: [facility];  Service: Open Heart Surgery;  Laterality: N/A;  . LAPAROSCOPIC CHOLECYSTECTOMY    . MAZE N/A 06/22/2014   Procedure: MAZE;  Surgeon: Melrose Nakayama, MD;  Location: [facility];  Service: Open Heart Surgery;  Laterality: N/A;  . TEE WITHOUT CARDIOVERSION N/A 06/22/2014   Procedure: TRANSESOPHAGEAL ECHOCARDIOGRAM (TEE);  Surgeon: Melrose Nakayama, MD;  Location: [facility];  Service: Open Heart Surgery;  Laterality: N/A;    Family History  Problem Relation Age of Onset  . Alzheimer's disease Mother   . Emphysema Father   . Cancer Brother   . Arrhythmia Sister   . Heart attack Sister   . Heart disease Sister   . Hyperlipidemia Sister   . Hypertension Sister     Allergies  Allergen Reactions  . Lipitor [Atorvastatin] Other (See Comments)    Myopathy Spring 2006  . Pravastatin Other (See Comments)    LEG CRAMPS  . Simvastatin Other (See Comments)    LEG CRAMPS    Current Outpatient Prescriptions on File Prior to Visit  Medication Sig Dispense Refill  . apixaban (ELIQUIS) 5 MG TABS tablet Take 1 tablet (5 mg total) by mouth 2 (two) times daily. 60 tablet 8  . aspirin 81 MG tablet Take 81 mg by mouth daily.    . carvedilol (COREG) 6.25 MG tablet Take 1 tablet (6.25 mg total) by mouth 2 (two) times daily with a meal. 60 tablet 11  . dicyclomine (BENTYL) 20 MG tablet Take 1 tablet (20 mg total) by mouth 4 (four) times daily -  before meals and at bedtime. (Patient taking differently: Take 20 mg by mouth 2 (two) times daily. ) 120 tablet 2  .  fluorometholone (FML) 0.1 % ophthalmic suspension Place 1 drop into the right eye daily as needed (for eye redness and itching).     . fluticasone (FLONASE) 50 MCG/ACT nasal spray Place 1 spray into both nostrils as needed for allergies or rhinitis. 16 g 11  . ibuprofen (ADVIL,MOTRIN) 200 MG tablet Take 200 mg by mouth daily as needed for headache or moderate pain.     . Insulin Pen Needle 31G X 8 MM MISC Use as directed with Lantus. 100 each 5  . isosorbide mononitrate (IMDUR) 30 MG 24 hr tablet Take 1 tablet (30 mg total) by mouth daily. 90 tablet 3  . lisinopril (PRINIVIL,ZESTRIL) 5 MG tablet Take 1 tablet (5 mg total) by  mouth daily. 90 tablet 3  . MAGNESIUM PO Take 1 tablet by mouth daily as needed (for leg cramping).     . nitroGLYCERIN (NITROSTAT) 0.4 MG SL tablet Place 1 tablet (0.4 mg total) under the tongue every 5 (five) minutes as needed for chest pain. 25 tablet 3  . rosuvastatin (CRESTOR) 10 MG tablet Take 1 tablet (10 mg total) by mouth daily. 30 tablet 11  . tamsulosin (FLOMAX) 0.4 MG CAPS capsule Take 1 capsule (0.4 mg total) by mouth as needed. For difficulty urinating. 30 capsule 5  . TRULICITY [id number] SOPN Inject 0.75 mg as directed every Wednesday.   1   No current facility-administered medications on file prior to visit.     BP 140/84   Pulse 68   Temp 97.5 F (36.4 C) (Oral)   Ht 5\' 8"  (1.727 m)   Wt 194 lb (88 kg)   SpO2 98%   BMI 29.50 kg/m    Objective:   Physical Exam  Constitutional: He appears well-nourished.  Cardiovascular: Normal rate and regular rhythm.   Pulmonary/Chest: Effort normal and breath sounds normal.  Skin: Skin is warm and dry.          Assessment & Plan:

## 2015-09-24 NOTE — Progress Notes (Signed)
Pre visit review using our clinic review tool, if applicable. No additional management support is needed unless otherwise documented below in the visit note. 

## 2015-09-24 NOTE — Patient Instructions (Signed)
Decrease your glyburide-metformin to 1 tablet twice daily. Increase your Lantus to 8 units every night at bedtime. Continue Trulicity as prescribed.  Continue to monitor your blood sugars and notify me if you see numbers below 90 or above 250 consistently.  Schedule a lab appointment after October 31st for re-evaluation of your A1C and an office visit in early November for follow up.  It was a pleasure to see you today!  Diabetes Mellitus and Food It is important for you to manage your blood sugar (glucose) level. Your blood glucose level can be greatly affected by what you eat. Eating healthier foods in the appropriate amounts throughout the day at about the same time each day will help you control your blood glucose level. It can also help slow or prevent worsening of your diabetes mellitus. Healthy eating may even help you improve the level of your blood pressure and reach or maintain a healthy weight.  General recommendations for healthful eating and cooking habits include:  Eating meals and snacks regularly. Avoid going long periods of time without eating to lose weight.  Eating a diet that consists mainly of plant-based foods, such as fruits, vegetables, nuts, legumes, and whole grains.  Using low-heat cooking methods, such as baking, instead of high-heat cooking methods, such as deep frying. Work with your dietitian to make sure you understand how to use the Nutrition Facts information on food labels. HOW CAN FOOD AFFECT ME? Carbohydrates Carbohydrates affect your blood glucose level more than any other type of food. Your dietitian will help you determine how many carbohydrates to eat at each meal and teach you how to count carbohydrates. Counting carbohydrates is important to keep your blood glucose at a healthy level, especially if you are using insulin or taking certain medicines for diabetes mellitus. Alcohol Alcohol can cause sudden decreases in blood glucose (hypoglycemia),  especially if you use insulin or take certain medicines for diabetes mellitus. Hypoglycemia can be a life-threatening condition. Symptoms of hypoglycemia (sleepiness, dizziness, and disorientation) are similar to symptoms of having too much alcohol.  If your health care provider has given you approval to drink alcohol, do so in moderation and use the following guidelines:  Women should not have more than one drink per day, and men should not have more than two drinks per day. One drink is equal to:  12 oz of beer.  5 oz of wine.  1 oz of hard liquor.  Do not drink on an empty stomach.  Keep yourself hydrated. Have water, diet soda, or unsweetened iced tea.  Regular soda, juice, and other mixers might contain a lot of carbohydrates and should be counted. WHAT FOODS ARE NOT RECOMMENDED? As you make food choices, it is important to remember that all foods are not the same. Some foods have fewer nutrients per serving than other foods, even though they might have the same number of calories or carbohydrates. It is difficult to get your body what it needs when you eat foods with fewer nutrients. Examples of foods that you should avoid that are high in calories and carbohydrates but low in nutrients include:  Trans fats (most processed foods list trans fats on the Nutrition Facts label).  Regular soda.  Juice.  Candy.  Sweets, such as cake, pie, doughnuts, and cookies.  Fried foods. WHAT FOODS CAN I EAT? Eat nutrient-rich foods, which will nourish your body and keep you healthy. The food you should eat also will depend on several factors, including:  The calories  you need.  The medicines you take.  Your weight.  Your blood glucose level.  Your blood pressure level.  Your cholesterol level. You should eat a variety of foods, including:  Protein.  Lean cuts of meat.  Proteins low in saturated fats, such as fish, egg whites, and beans. Avoid processed meats.  Fruits and  vegetables.  Fruits and vegetables that may help control blood glucose levels, such as apples, mangoes, and yams.  Dairy products.  Choose fat-free or low-fat dairy products, such as milk, yogurt, and cheese.  Grains, bread, pasta, and rice.  Choose whole grain products, such as multigrain bread, whole oats, and brown rice. These foods may help control blood pressure.  Fats.  Foods containing healthful fats, such as nuts, avocado, olive oil, canola oil, and fish. DOES EVERYONE WITH DIABETES MELLITUS HAVE THE SAME MEAL PLAN? Because every person with diabetes mellitus is different, there is not one meal plan that works for everyone. It is very important that you meet with a dietitian who will help you create a meal plan that is just right for you.   This information is not intended to replace advice given to you by your health care provider. Make sure you discuss any questions you have with your health care provider.   Document Released: 09/15/2004 Document Revised: 01/09/2014 Document Reviewed: 11/15/2012 Elsevier Interactive Patient Education [employer].

## 2015-10-13 ENCOUNTER — Other Ambulatory Visit: Payer: Self-pay | Admitting: Primary Care

## 2015-10-13 MED ORDER — TAMSULOSIN HCL 0.4 MG PO CAPS: 0.4000 mg | ORAL_CAPSULE | ORAL | 1 refills | Status: DC | PRN

## 2015-10-13 NOTE — Telephone Encounter (Signed)
Received faxed refill request for tamsulosin (FLOMAX) 0.4 MG CAPS capsule.  To change to 90 days supply. Sent change as requested.

## 2015-10-26 ENCOUNTER — Other Ambulatory Visit: Payer: Self-pay | Admitting: Primary Care

## 2015-10-26 DIAGNOSIS — Z794 Long term (current) use of insulin: Secondary | ICD-10-CM

## 2015-10-26 DIAGNOSIS — E785 Hyperlipidemia, unspecified: Secondary | ICD-10-CM

## 2015-10-26 DIAGNOSIS — E119 Type 2 diabetes mellitus without complications: Secondary | ICD-10-CM

## 2015-11-04 ENCOUNTER — Other Ambulatory Visit (INDEPENDENT_AMBULATORY_CARE_PROVIDER_SITE_OTHER): Payer: Medicare Other

## 2015-11-04 DIAGNOSIS — E785 Hyperlipidemia, unspecified: Secondary | ICD-10-CM

## 2015-11-04 DIAGNOSIS — E119 Type 2 diabetes mellitus without complications: Secondary | ICD-10-CM | POA: Diagnosis not present

## 2015-11-04 DIAGNOSIS — Z794 Long term (current) use of insulin: Secondary | ICD-10-CM | POA: Diagnosis not present

## 2015-11-04 LAB — LIPID PANEL
CHOL/HDL RATIO: 3
Cholesterol: 106 mg/dL (ref 0–200)
HDL: 40.6 mg/dL (ref >39.00)
LDL CALC: 43 mg/dL (ref 0–99)
NonHDL: 64.9
Triglycerides: 112 mg/dL (ref 0.0–149.0)
VLDL: 22.4 mg/dL (ref 0.0–40.0)

## 2015-11-04 LAB — MICROALBUMIN / CREATININE URINE RATIO
CREATININE, U: 126.8 mg/dL
MICROALB UR: 1.6 mg/dL (ref 0.0–1.9)
MICROALB/CREAT RATIO: 1.3 mg/g (ref 0.0–30.0)

## 2015-11-04 LAB — HEMOGLOBIN A1C: Hgb A1c MFr Bld: 9.2 % — ABNORMAL HIGH (ref 4.6–6.5)

## 2015-11-08 ENCOUNTER — Encounter: Payer: Self-pay | Admitting: Primary Care

## 2015-11-08 ENCOUNTER — Ambulatory Visit (INDEPENDENT_AMBULATORY_CARE_PROVIDER_SITE_OTHER): Payer: Medicare Other | Admitting: Primary Care

## 2015-11-08 VITALS — BP 140/80 | HR 67 | Temp 97.8°F | Ht 68.0 in | Wt 194.0 lb

## 2015-11-08 DIAGNOSIS — E119 Type 2 diabetes mellitus without complications: Secondary | ICD-10-CM

## 2015-11-08 DIAGNOSIS — Z794 Long term (current) use of insulin: Secondary | ICD-10-CM

## 2015-11-08 DIAGNOSIS — I200 Unstable angina: Secondary | ICD-10-CM | POA: Diagnosis not present

## 2015-11-08 DIAGNOSIS — E118 Type 2 diabetes mellitus with unspecified complications: Secondary | ICD-10-CM | POA: Diagnosis not present

## 2015-11-08 DIAGNOSIS — Z23 Encounter for immunization: Secondary | ICD-10-CM | POA: Diagnosis not present

## 2015-11-08 MED ORDER — GLYBURIDE 5 MG PO TABS: 5.0000 mg | ORAL_TABLET | Freq: Two times a day (BID) | ORAL | 1 refills | Status: DC

## 2015-11-08 MED ORDER — INSULIN GLARGINE 100 UNIT/ML SOLOSTAR PEN: 12.0000 [IU] | PEN_INJECTOR | Freq: Every evening | SUBCUTANEOUS | 11 refills | Status: DC

## 2015-11-08 MED ORDER — INSULIN GLARGINE 100 UNIT/ML SOLOSTAR PEN: 12.0000 [IU] | PEN_INJECTOR | Freq: Every evening | SUBCUTANEOUS | 3 refills | Status: DC

## 2015-11-08 NOTE — Progress Notes (Signed)
Pre visit review using our clinic review tool, if applicable. No additional management support is needed unless otherwise documented below in the visit note. 

## 2015-11-08 NOTE — Patient Instructions (Signed)
Stop taking glyburide-metformin tablets for diabetes. Start taking Glyburide 5 mg twice daily with a meal for diabetes.  We've increased your Lantus to 12 units every evening at bedtime.   Continue to monitor your blood sugars in the morning and before dinner. I will call you for those readings in 2 weeks. If they are still above goal we will increase your Lantus.  Follow up in 3 months for re-evaluation.  It was a pleasure to see you today!  Diabetes Mellitus and Food It is important for you to manage your blood sugar (glucose) level. Your blood glucose level can be greatly affected by what you eat. Eating healthier foods in the appropriate amounts throughout the day at about the same time each day will help you control your blood glucose level. It can also help slow or prevent worsening of your diabetes mellitus. Healthy eating may even help you improve the level of your blood pressure and reach or maintain a healthy weight.  General recommendations for healthful eating and cooking habits include:  Eating meals and snacks regularly. Avoid going long periods of time without eating to lose weight.  Eating a diet that consists mainly of plant-based foods, such as fruits, vegetables, nuts, legumes, and whole grains.  Using low-heat cooking methods, such as baking, instead of high-heat cooking methods, such as deep frying. Work with your dietitian to make sure you understand how to use the Nutrition Facts information on food labels. HOW CAN FOOD AFFECT ME? Carbohydrates Carbohydrates affect your blood glucose level more than any other type of food. Your dietitian will help you determine how many carbohydrates to eat at each meal and teach you how to count carbohydrates. Counting carbohydrates is important to keep your blood glucose at a healthy level, especially if you are using insulin or taking certain medicines for diabetes mellitus. Alcohol Alcohol can cause sudden decreases in blood  glucose (hypoglycemia), especially if you use insulin or take certain medicines for diabetes mellitus. Hypoglycemia can be a life-threatening condition. Symptoms of hypoglycemia (sleepiness, dizziness, and disorientation) are similar to symptoms of having too much alcohol.  If your health care provider has given you approval to drink alcohol, do so in moderation and use the following guidelines:  Women should not have more than one drink per day, and men should not have more than two drinks per day. One drink is equal to:  12 oz of beer.  5 oz of wine.  1 oz of hard liquor.  Do not drink on an empty stomach.  Keep yourself hydrated. Have water, diet soda, or unsweetened iced tea.  Regular soda, juice, and other mixers might contain a lot of carbohydrates and should be counted. WHAT FOODS ARE NOT RECOMMENDED? As you make food choices, it is important to remember that all foods are not the same. Some foods have fewer nutrients per serving than other foods, even though they might have the same number of calories or carbohydrates. It is difficult to get your body what it needs when you eat foods with fewer nutrients. Examples of foods that you should avoid that are high in calories and carbohydrates but low in nutrients include:  Trans fats (most processed foods list trans fats on the Nutrition Facts label).  Regular soda.  Juice.  Candy.  Sweets, such as cake, pie, doughnuts, and cookies.  Fried foods. WHAT FOODS CAN I EAT? Eat nutrient-rich foods, which will nourish your body and keep you healthy. The food you should eat also will  depend on several factors, including:  The calories you need.  The medicines you take.  Your weight.  Your blood glucose level.  Your blood pressure level.  Your cholesterol level. You should eat a variety of foods, including:  Protein.  Lean cuts of meat.  Proteins low in saturated fats, such as fish, egg whites, and beans. Avoid processed  meats.  Fruits and vegetables.  Fruits and vegetables that may help control blood glucose levels, such as apples, mangoes, and yams.  Dairy products.  Choose fat-free or low-fat dairy products, such as milk, yogurt, and cheese.  Grains, bread, pasta, and rice.  Choose whole grain products, such as multigrain bread, whole oats, and brown rice. These foods may help control blood pressure.  Fats.  Foods containing healthful fats, such as nuts, avocado, olive oil, canola oil, and fish. DOES EVERYONE WITH DIABETES MELLITUS HAVE THE SAME MEAL PLAN? Because every person with diabetes mellitus is different, there is not one meal plan that works for everyone. It is very important that you meet with a dietitian who will help you create a meal plan that is just right for you.   This information is not intended to replace advice given to you by your health care provider. Make sure you discuss any questions you have with your health care provider.   Document Released: 09/15/2004 Document Revised: 01/09/2014 Document Reviewed: 11/15/2012 Elsevier Interactive Patient Education [employer].

## 2015-11-08 NOTE — Progress Notes (Signed)
Subjective:    Patient ID: [name], male    DOB: January 17, 1945, 70 y.o.   MRN: [id number]  HPI  Mr. Kice is a 70 year old male who presents today for follow up of Type 2 Diabetes.  Recent A1C of 9.2 which is an improvement from 11.2 in July 2017. He is currently managed on Glyburide-Metformin 5-1000mg  BID and Lantus 8 units HS. He's checking his sugars in the morning fasting which are running mid 200's, then prior to dinner which runs 175-200. He's experienced a lot of GI upset over the past year since he started the Glyburide-Metformin combination. He has a history of IBS. He denies numbness/tingling, visual changes, chest pain, dizziness. He's been working on improvements in his diet.  His diet currently consists of: Breakfast: Skips, bacon, sausage, cereal  Lunch: Sandwich, Restaurants Dinner: Sandwich, Meat (chicken fried steak, fried meats, steak), rice, potatoes Snacks: Crackers Desserts: Occasionally Beverages: Diet soda, water (3-4 glasses daily)  Review of Systems  Eyes: Negative for visual disturbance.  Respiratory: Negative for shortness of breath.   Cardiovascular: Negative for chest pain.  Neurological: Negative for dizziness and numbness.       Past Medical History:  Diagnosis Date  . Arthritis    "fingers, shoulders" (08/03/2015)  . Atrial fibrillation (HCC)    Paroxysmal, rare episodes, sinus rhythm on flecainide, patient prefers not to take Coumadin  . Bradycardia    April, 2013  . Chest pain    Nuclear, 2006, no ischemia  . Chronic lower back pain    "all my life"  . Coronary artery disease    s/p staged cath 05/12/2014 and 5/11, DES x 2 to heavily calcified RCA, residual with 60% prox LAD, 70% D1  . Ejection fraction    EF 55-60%,  echo, January, 2011  . Elevated CPK    CPK elevated with normal MB and normal troponin the past  . Heart murmur   . History of echocardiogram    Echo 8/16: EF 55%, normal wall motion, grade 2 diastolic dysfunction,  trivial MR, normal RV function, PASP 27 mmHg  . Hypertension   . IBS (irritable bowel syndrome)   . Myocardial infarction 06/2014  . Sinus drainage   . Type II diabetes mellitus ([place])      Social History   Social History  . Marital status: Married    Spouse name: N/A  . Number of children: N/A  . Years of education: N/A   Occupational History  . Not on file.   Social History Main Topics  . Smoking status: Never Smoker  . Smokeless tobacco: Never Used  . Alcohol use No  . Drug use: No  . Sexual activity: Not on file   Other Topics Concern  . Not on file   Social History Narrative  . No narrative on file    Past Surgical History:  Procedure Laterality Date  . APPENDECTOMY    . CARDIAC CATHETERIZATION N/A 05/12/2014   Procedure: Left Heart Cath and Coronary Angiography;  Surgeon: Troy Sine, MD;  Location: [place] [facility];  Service: Cardiovascular;  Laterality: N/A;  . CARDIAC CATHETERIZATION N/A 05/13/2014   Procedure: Coronary/Graft Atherectomy;  Surgeon: Troy Sine, MD;  Location: [place] [facility];  Service: Cardiovascular;  Laterality: N/A;  . CARDIAC CATHETERIZATION Right 05/13/2014   Procedure: Temporary Pacemaker;  Surgeon: Troy Sine, MD;  Location: [place] [facility];  Service: Cardiovascular;  Laterality: Right;  . CARDIAC CATHETERIZATION N/A 05/13/2014  Procedure: Coronary Stent Intervention;  Surgeon: Troy Sine, MD;  Location: [place] [facility];  Service: Cardiovascular;  Laterality: N/A;  . CARDIAC CATHETERIZATION N/A 06/18/2014   Procedure: Left Heart Cath and Coronary Angiography;  Surgeon: Peter M Martinique, MD;  Location: [place] [facility];  Service: Cardiovascular;  Laterality: N/A;  . CARDIAC CATHETERIZATION N/A 08/03/2015   Procedure: Left Heart Cath and Cors/Grafts Angiography;  Surgeon: Nelva Bush, MD;  Location: [place] [facility];  Service: Cardiovascular;  Laterality: N/A;  . CARDIOVASCULAR STRESS TEST  05/06/2014  . CATARACT  EXTRACTION W/ INTRAOCULAR LENS IMPLANT Left   . CORONARY ARTERY BYPASS GRAFT N/A 06/22/2014   Procedure: CORONARY ARTERY BYPASS GRAFTING (CABG) x five, using left internal mammary artery, and right leg greater saphenous vein harvested endoscopically;  Surgeon: Melrose Nakayama, MD;  Location: [place];  Service: Open Heart Surgery;  Laterality: N/A;  . LAPAROSCOPIC CHOLECYSTECTOMY    . MAZE N/A 06/22/2014   Procedure: MAZE;  Surgeon: Melrose Nakayama, MD;  Location: [place];  Service: Open Heart Surgery;  Laterality: N/A;  . TEE WITHOUT CARDIOVERSION N/A 06/22/2014   Procedure: TRANSESOPHAGEAL ECHOCARDIOGRAM (TEE);  Surgeon: Melrose Nakayama, MD;  Location: [place];  Service: Open Heart Surgery;  Laterality: N/A;    Family History  Problem Relation Age of Onset  . Alzheimer's disease Mother   . Emphysema Father   . Cancer Brother   . Arrhythmia Sister   . Heart attack Sister   . Heart disease Sister   . Hyperlipidemia Sister   . Hypertension Sister     Allergies  Allergen Reactions  . Lipitor [Atorvastatin] Other (See Comments)    Myopathy Spring 2006  . Pravastatin Other (See Comments)    LEG CRAMPS  . Simvastatin Other (See Comments)    LEG CRAMPS    Current Outpatient Prescriptions on File Prior to Visit  Medication Sig Dispense Refill  . apixaban (ELIQUIS) 5 MG TABS tablet Take 1 tablet (5 mg total) by mouth 2 (two) times daily. 60 tablet 8  . aspirin 81 MG tablet Take 81 mg by mouth daily.    . carvedilol (COREG) 6.25 MG tablet Take 1 tablet (6.25 mg total) by mouth 2 (two) times daily with a meal. 60 tablet 11  . dicyclomine (BENTYL) 20 MG tablet Take 1 tablet (20 mg total) by mouth 4 (four) times daily -  before meals and at bedtime. (Patient taking differently: Take 20 mg by mouth 2 (two) times daily. ) 120 tablet 2  . fluorometholone (FML) 0.1 % ophthalmic suspension Place 1 drop into the right eye daily as needed (for eye redness and itching).     . fluticasone  (FLONASE) 50 MCG/ACT nasal spray Place 1 spray into both nostrils as needed for allergies or rhinitis. 16 g 11  . ibuprofen (ADVIL,MOTRIN) 200 MG tablet Take 200 mg by mouth daily as needed for headache or moderate pain.     . Insulin Pen Needle 31G X 8 MM MISC Use as directed with Lantus. 100 each 5  . isosorbide mononitrate (IMDUR) 30 MG 24 hr tablet Take 1 tablet (30 mg total) by mouth daily. 90 tablet 3  . lisinopril (PRINIVIL,ZESTRIL) 5 MG tablet Take 1 tablet (5 mg total) by mouth daily. 90 tablet 3  . MAGNESIUM PO Take 1 tablet by mouth daily as needed (for leg cramping).     . nitroGLYCERIN (NITROSTAT) 0.4 MG SL tablet Place 1 tablet (0.4 mg total) under the  tongue every 5 (five) minutes as needed for chest pain. 25 tablet 3  . rosuvastatin (CRESTOR) 10 MG tablet Take 1 tablet (10 mg total) by mouth daily. 30 tablet 11  . tamsulosin (FLOMAX) 0.4 MG CAPS capsule Take 1 capsule (0.4 mg total) by mouth as needed. For difficulty urinating. 90 capsule 1   No current facility-administered medications on file prior to visit.     BP 140/80   Pulse 67   Temp 97.8 F (36.6 C) (Oral)   Ht 5\' 8"  (1.727 m)   Wt 194 lb (88 kg)   SpO2 97%   BMI 29.50 kg/m    Objective:   Physical Exam  Constitutional: He appears well-nourished.  Cardiovascular: Normal rate and regular rhythm.   Pulmonary/Chest: Effort normal and breath sounds normal.  Skin: Skin is warm and dry.          Assessment & Plan:

## 2015-11-08 NOTE — Addendum Note (Signed)
Addended by: Jacqualin Combes on: 11/08/2015 10:56 AM   Modules accepted: Orders

## 2015-11-08 NOTE — Assessment & Plan Note (Signed)
Improvement in A1C from 11.2 to 9.2. Will remove Metformin from regimen as it is likely the cause of his GI upset. Continue Glyburide 5 mg BID. Increase Lantus to 12 units HS.  Long discussion today regarding making changes to diet. Urine micro albumin negative. Managed on ACE and statin. Follow up in 3 months for repeat A1C, will check on sugars in 2 weeks, if above goal, will increase Lantus accordingly.

## 2015-11-09 DIAGNOSIS — H25011 Cortical age-related cataract, right eye: Secondary | ICD-10-CM | POA: Diagnosis not present

## 2015-11-09 DIAGNOSIS — H2511 Age-related nuclear cataract, right eye: Secondary | ICD-10-CM | POA: Diagnosis not present

## 2015-11-09 DIAGNOSIS — H25041 Posterior subcapsular polar age-related cataract, right eye: Secondary | ICD-10-CM | POA: Diagnosis not present

## 2015-11-09 DIAGNOSIS — I10 Essential (primary) hypertension: Secondary | ICD-10-CM | POA: Diagnosis not present

## 2015-11-11 ENCOUNTER — Telehealth: Payer: Self-pay

## 2015-11-11 NOTE — Telephone Encounter (Signed)
**Note De-Identified [name] Obfuscation** The pt needs to have Cataract extraction w/intraocular lens implantation of the right eye followed by the left eye. They are requesting cardiac clearance.  FYI: the pt is taking Aspirin 81 mg daily and Eliquis 5 mg BIB.  Please advise.

## 2015-11-12 NOTE — Telephone Encounter (Signed)
No further cardiac testing needed before surgery.  OK to stay on anticoagulation if ok with the ophthalmologist.

## 2015-11-15 ENCOUNTER — Telehealth: Payer: Self-pay

## 2015-11-15 NOTE — Telephone Encounter (Signed)
Pt left v/m; pt was seen 11/08/15; metformin was stopped due to stomach problem; stopping the metformin has helped a lot but BS are running high with the insulin; BS ranging 240 - 290. Pt request cb. Pt wants to know if should take Jardiance or increase insulin.

## 2015-11-15 NOTE — Telephone Encounter (Signed)
Please have patient increase his Lantus to 15 units every night at bedtime. We will check on him in 1 week. Please update the new dose of Lantus in the med rec. Thanks.

## 2015-11-15 NOTE — Telephone Encounter (Signed)
Patient stated to call back on his cell number because home number is not working.

## 2015-11-16 NOTE — Telephone Encounter (Signed)
**Note De-identified Chenel Wernli Obfuscation** This message has been faxed to [facility] @ [phone] ATTN: Michelle. I did receive confirmation that the fax was successful. 

## 2015-11-16 NOTE — Telephone Encounter (Signed)
Spoken and notified patient of Kate's comments. Patient verbalized understanding. 

## 2015-12-06 ENCOUNTER — Telehealth: Payer: Self-pay | Admitting: Primary Care

## 2015-12-06 NOTE — Telephone Encounter (Signed)
Message left for patient to return my call.  

## 2015-12-06 NOTE — Telephone Encounter (Signed)
Please call and check on patient's blood sugars. Any improvement since increasing Lanuts to 20 units?  Please change Lantus dose to 20 units every evening in med list.

## 2015-12-06 NOTE — Telephone Encounter (Signed)
Patient Name: [name] Gender: Male DOB: September 11, 1945 Age: 70 Y 56 M 23 D Return Phone Number: [phone] (Primary), [phone] (Secondary) Address: City/State/Zip: [place] Night - Client Client Site [place] Physician Alma Friendly - NP Contact Type Call Who Is Calling Patient / Member / Family / Caregiver Call Type Triage / Clinical Relationship To Patient Self Return Phone Number [phone] (Primary) Chief Complaint Blood Sugar High Reason for Call Symptomatic / Request for Health Information Initial Comment He recently changed blood sugar meds and is taking 15 mg a night. He doesn't have any symptoms but when he wakes up his readings will be between 225-350. Current reading is 315. PreDisposition Did not know what to do Translation No Nurse Assessment Nurse: Martyn Ehrich, RN, Felicia Date/Time (Eastern Time): 12/03/2015 8:13:35 PM Confirm and document reason for call. If symptomatic, describe symptoms. ---PT has had some blood sugars in 200-300 range. He was doing better on blood sugars for awhile but was having some stomach issues on metformin so she changed the medication to glyburide 5 mg at 5 pm and 5 mg in am- he takes 15 units of lantus insulin . His blood sugar is high in the ams 250-350 blood sugar. Now his blood sugar is 358. No fever doesnt feel bad at all Does the patient have any new or worsening symptoms? ---Yes Will a triage be completed? ---Yes Related visit to physician within the last 2 weeks? ---Yes Does the PT have any chronic conditions? (i.e. diabetes, asthma, etc.) ---Yes List chronic conditions. ---DM, open heart surgery - 5 way by pass (2 MI's) Is this a behavioral health or substance abuse call? ---No Guidelines Guideline Title Affirmed Question Affirmed Notes Nurse Date/Time (Eastern Time) Diabetes - High Blood Sugar [1] Blood glucose > 300 mg/dl (16.5 mmol/l) AND [2] two  or more times in a row [place], RN, Felicia 06/04/6946 5:46:27 PM Disp. Time Eilene Ghazi Time) Disposition Final User 12/03/2015 8:08:33 PM Send To RN Personal Wisdom, Beatrix Shipper NOTE: All timestamps contained within this report are represented as [place] Standard Time. CONFIDENTIALTY NOTICE: This fax transmission is intended only for the addressee. It contains information that is legally privileged, confidential or otherwise protected from use or disclosure. If you are not the intended recipient, you are strictly prohibited from reviewing, disclosing, copying using or disseminating any of this information or taking any action in reliance on or regarding this information. If you have received this fax in error, please notify us immediately by telephone so that we can arrange for its return to [place]. Phone: [phone], Toll-Free: [phone], Fax: [phone] Page: 2 of 2 Call Id: [id number] [place]. Time Eilene Ghazi Time) Disposition Final User 12/03/2015 8:35:39 PM Called On-Call Provider [place], RN, [facility] 78/02/4233 3:61:44 PM Call Completed Martyn Ehrich, RN, Solmon Ice 31/05/4006 6:76:19 PM Call PCP Now Yes Martyn Ehrich, RN, Alphia Kava Understands: Yes Disagree/Comply: Comply Care Advice Given Per Guideline CALL PCP NOW: You need to discuss this with your doctor. I'll page him now. If you haven't heard from the on-call doctor within 30 minutes, call again. * Vomiting occurs * Rapid breathing occurs * You become worse. Comments User: Daphene Calamity, RN Date/Time Eilene Ghazi Time): 12/03/2015 8:22:46 PM the last time she changed his medication was 1 month ago - added lantus and switched completely to glyburide from metformin User: Daphene Calamity, RN Date/Time Eilene Ghazi Time): 12/03/2015 8:23:08 PM he has been 200-300 for a few d User: Daphene Calamity, RN Date/Time Eilene Ghazi Time): 12/03/2015  8:24:39 PM he has been in the 200's to 300's for a few days but has not gone into the 400 mark on blood sugar User: Daphene Calamity,  RN Date/Time Eilene Ghazi Time): 12/03/2015 8:31:23 PM blood sugars the last 3 d were Weds 315 blood sugar at 8am - 247 at 8 pm, THurs he didnt check it in the evening, didnt check until he came home from beach this afternoon - but he intuitively felt it was high this am in the 200-300 range the last 2 ams as well Referrals REFERRED TO PCP OFFICE Paging DoctorName Phone DateTime Result/Outcome Message Type Notes Shanon Ace - MD [phone] 12/03/2015 8:35:39 PM Called On Call Provider - Reached Doctor Paged Shanon Ace - MD 12/03/2015 8:39:51 PM Spoke with On Call - General Message Result she said telll him after hours we give bandaid advice - she said go up to 20 units of lantus and check blood sugar BID and call MD [facility]

## 2015-12-07 NOTE — Telephone Encounter (Signed)
Pt returned call again

## 2015-12-07 NOTE — Telephone Encounter (Signed)
Spoken to patient and he stated that. He would like to know if we increased the Lantus to 20 units., would that help? He does not eat pasted 6 pm and last night his sugar was 400. Patient asked if need to change dosage of the glyburide or Lantus twice a day instead. Patient is concern since blood sugar readings are high.

## 2015-12-07 NOTE — Telephone Encounter (Signed)
Yes, he needs to increase his Lantus to 20 units at bedtime as recommended. He needs to check his sugars three times daily in order for me to get an idea of the best treatment: (morning before breakfast, 2 hours after lunch, before bedtime). Please have him record his readings and we will call for readings in 2 weeks. He MUST continue to work on improvements in diet and exercise.

## 2015-12-07 NOTE — Telephone Encounter (Signed)
Spoken and notified patient of Kate's comments. Patient verbalized understanding. 

## 2015-12-07 NOTE — Telephone Encounter (Signed)
Pt returned call please call back

## 2015-12-13 DIAGNOSIS — H52223 Regular astigmatism, bilateral: Secondary | ICD-10-CM | POA: Diagnosis not present

## 2015-12-13 DIAGNOSIS — H5203 Hypermetropia, bilateral: Secondary | ICD-10-CM | POA: Diagnosis not present

## 2015-12-13 DIAGNOSIS — H2511 Age-related nuclear cataract, right eye: Secondary | ICD-10-CM | POA: Diagnosis not present

## 2016-01-01 ENCOUNTER — Other Ambulatory Visit: Payer: Self-pay | Admitting: Primary Care

## 2016-01-01 DIAGNOSIS — E119 Type 2 diabetes mellitus without complications: Secondary | ICD-10-CM

## 2016-01-01 DIAGNOSIS — Z794 Long term (current) use of insulin: Principal | ICD-10-CM

## 2016-01-04 ENCOUNTER — Telehealth: Payer: Self-pay | Admitting: Primary Care

## 2016-01-04 NOTE — Telephone Encounter (Signed)
Please schedule patient for an office visit to discuss. Please have him bring record of his blood sugar levels.

## 2016-01-04 NOTE — Telephone Encounter (Signed)
Pt called because the insulin he is taking is not doing anything.  He said when he gets up in the am his blood sugar is 275-300/400.  He doesn't eat anything at night time and takes the med at night time.  Please call him to discuss.

## 2016-01-04 NOTE — Telephone Encounter (Signed)
Spoken to patient and schedule appt on 01/05/2016 to discuss.

## 2016-01-05 ENCOUNTER — Telehealth: Payer: Self-pay

## 2016-01-05 ENCOUNTER — Ambulatory Visit (INDEPENDENT_AMBULATORY_CARE_PROVIDER_SITE_OTHER): Payer: Medicare Other | Admitting: Primary Care

## 2016-01-05 ENCOUNTER — Encounter: Payer: Self-pay | Admitting: Primary Care

## 2016-01-05 VITALS — BP 140/84 | HR 68 | Temp 97.8°F | Ht 68.0 in | Wt 195.0 lb

## 2016-01-05 DIAGNOSIS — Z794 Long term (current) use of insulin: Secondary | ICD-10-CM

## 2016-01-05 DIAGNOSIS — E119 Type 2 diabetes mellitus without complications: Secondary | ICD-10-CM

## 2016-01-05 MED ORDER — INSULIN DETEMIR 100 UNIT/ML ~~LOC~~ SOLN: SUBCUTANEOUS | 11 refills | Status: DC

## 2016-01-05 MED ORDER — INSULIN DETEMIR 100 UNIT/ML FLEXPEN: PEN_INJECTOR | SUBCUTANEOUS | 11 refills | Status: DC

## 2016-01-05 NOTE — Telephone Encounter (Signed)
[facility] called to verify to d/c vial for levemir and give flexpen instead. Also is pt to stop lantus and glyburide. Pt AVS at office visit today; current med list does have levemir flexpen and pt was to stop glyburide and lantus. Pharmacist voiced understanding. FYI to [name] NP.

## 2016-01-05 NOTE — Telephone Encounter (Signed)
Noted and correct. He is to stop Lantus and Glyburide, switch to Levemir Flexpen.

## 2016-01-05 NOTE — Progress Notes (Signed)
Subjective:    Patient ID: [name], male    DOB: 31-May-1945, 71 y.o.   MRN: [id number]  HPI  [name] is a 71 year old male who presents today for follow up of type 2 diabetes and hyperglycemia. He is currently managed on Lantus 20 units HS and Glyburide 5 mg BID with meals. He was previously managed on Metformin but this caused GI upset as he has a history of IBS. His last A1C was 9.2 in November 2017.   Since his last visit he's been monitoring his blood sugars twice daily (AM fasting and just before dinner). His AM fasting sugars are running 200-300's and his PM sugars are running 225-275.  His IBS symptoms have improved significantly since he stopped his Metformin. His highest blood sugar has been 390 and his lowest has been 200.  He endorses a fair diet overall.  Breakfast: Fast food, sausage biscuit, eggs Lunch: Sandwich, some fast food Dinner: Beans, rice, meat, some vegetables Snacks: Peanut butter, yogurt  Exercise: He plays golf weekly, otherwise does not regularly exercise.  Review of Systems  Respiratory: Negative for shortness of breath.   Cardiovascular: Negative for chest pain.  Neurological: Negative for dizziness, weakness and numbness.       Past Medical History:  Diagnosis Date  . Arthritis    "fingers, shoulders" (08/03/2015)  . Atrial fibrillation (HCC)    Paroxysmal, rare episodes, sinus rhythm on flecainide, patient prefers not to take Coumadin  . Bradycardia    April, 2013  . Chest pain    Nuclear, 2006, no ischemia  . Chronic lower back pain    "all my life"  . Coronary artery disease    s/p staged cath 05/12/2014 and 5/11, DES x 2 to heavily calcified RCA, residual with 60% prox LAD, 70% D1  . Ejection fraction    EF 55-60%,  echo, January, 2011  . Elevated CPK    CPK elevated with normal MB and normal troponin the past  . Heart murmur   . History of echocardiogram    Echo 8/16: EF 55%, normal wall motion, grade 2 diastolic dysfunction,  trivial MR, normal RV function, PASP 27 mmHg  . Hypertension   . IBS (irritable bowel syndrome)   . Myocardial infarction 06/2014  . Sinus drainage   . Type II diabetes mellitus ([place])      Social History   Social History  . Marital status: Married    Spouse name: N/A  . Number of children: N/A  . Years of education: N/A   Occupational History  . Not on file.   Social History Main Topics  . Smoking status: Never Smoker  . Smokeless tobacco: Never Used  . Alcohol use No  . Drug use: No  . Sexual activity: Not on file   Other Topics Concern  . Not on file   Social History Narrative  . No narrative on file    Past Surgical History:  Procedure Laterality Date  . APPENDECTOMY    . CARDIAC CATHETERIZATION N/A 05/12/2014   Procedure: Left Heart Cath and Coronary Angiography;  Surgeon: Troy Sine, MD;  Location: [place] [facility];  Service: Cardiovascular;  Laterality: N/A;  . CARDIAC CATHETERIZATION N/A 05/13/2014   Procedure: Coronary/Graft Atherectomy;  Surgeon: Troy Sine, MD;  Location: [place] [facility];  Service: Cardiovascular;  Laterality: N/A;  . CARDIAC CATHETERIZATION Right 05/13/2014   Procedure: Temporary Pacemaker;  Surgeon: Troy Sine, MD;  [facility]: [facility];  Service: Cardiovascular;  Laterality: Right;  . CARDIAC CATHETERIZATION N/A 05/13/2014   Procedure: Coronary Stent Intervention;  Surgeon: Troy Sine, MD;  Location: [place] [facility];  Service: Cardiovascular;  Laterality: N/A;  . CARDIAC CATHETERIZATION N/A 06/18/2014   Procedure: Left Heart Cath and Coronary Angiography;  Surgeon: Peter M Martinique, MD;  Location: [place] [facility];  Service: Cardiovascular;  Laterality: N/A;  . CARDIAC CATHETERIZATION N/A 08/03/2015   Procedure: Left Heart Cath and Cors/Grafts Angiography;  Surgeon: Nelva Bush, MD;  Location: [place] [facility];  Service: Cardiovascular;  Laterality: N/A;  . CARDIOVASCULAR STRESS TEST  05/06/2014  . CATARACT  EXTRACTION W/ INTRAOCULAR LENS IMPLANT Left   . CORONARY ARTERY BYPASS GRAFT N/A 06/22/2014   Procedure: CORONARY ARTERY BYPASS GRAFTING (CABG) x five, using left internal mammary artery, and right leg greater saphenous vein harvested endoscopically;  Surgeon: Melrose Nakayama, MD;  Location: [place];  Service: Open Heart Surgery;  Laterality: N/A;  . LAPAROSCOPIC CHOLECYSTECTOMY    . MAZE N/A 06/22/2014   Procedure: MAZE;  Surgeon: Melrose Nakayama, MD;  Location: [place];  Service: Open Heart Surgery;  Laterality: N/A;  . TEE WITHOUT CARDIOVERSION N/A 06/22/2014   Procedure: TRANSESOPHAGEAL ECHOCARDIOGRAM (TEE);  Surgeon: Melrose Nakayama, MD;  Location: [place];  Service: Open Heart Surgery;  Laterality: N/A;    Family History  Problem Relation Age of Onset  . Alzheimer's disease Mother   . Emphysema Father   . Cancer Brother   . Arrhythmia Sister   . Heart attack Sister   . Heart disease Sister   . Hyperlipidemia Sister   . Hypertension Sister     Allergies  Allergen Reactions  . Lipitor [Atorvastatin] Other (See Comments)    Myopathy Spring 2006  . Pravastatin Other (See Comments)    LEG CRAMPS  . Simvastatin Other (See Comments)    LEG CRAMPS    Current Outpatient Prescriptions on File Prior to Visit  Medication Sig Dispense Refill  . apixaban (ELIQUIS) 5 MG TABS tablet Take 1 tablet (5 mg total) by mouth 2 (two) times daily. 60 tablet 8  . aspirin 81 MG tablet Take 81 mg by mouth daily.    . carvedilol (COREG) 6.25 MG tablet Take 1 tablet (6.25 mg total) by mouth 2 (two) times daily with a meal. 60 tablet 11  . dicyclomine (BENTYL) 20 MG tablet Take 1 tablet (20 mg total) by mouth 4 (four) times daily -  before meals and at bedtime. (Patient taking differently: Take 20 mg by mouth 2 (two) times daily. ) 120 tablet 2  . fluorometholone (FML) 0.1 % ophthalmic suspension Place 1 drop into the right eye daily as needed (for eye redness and itching).     . fluticasone  (FLONASE) 50 MCG/ACT nasal spray Place 1 spray into both nostrils as needed for allergies or rhinitis. 16 g 11  . ibuprofen (ADVIL,MOTRIN) 200 MG tablet Take 200 mg by mouth daily as needed for headache or moderate pain.     . Insulin Pen Needle 31G X 8 MM MISC Use as directed with Lantus. 100 each 5  . isosorbide mononitrate (IMDUR) 30 MG 24 hr tablet Take 1 tablet (30 mg total) by mouth daily. 90 tablet 3  . lisinopril (PRINIVIL,ZESTRIL) 5 MG tablet Take 1 tablet (5 mg total) by mouth daily. 90 tablet 3  . MAGNESIUM PO Take 1 tablet by mouth daily as needed (for leg cramping).     Marland Kitchen  nitroGLYCERIN (NITROSTAT) 0.4 MG SL tablet Place 1 tablet (0.4 mg total) under the tongue every 5 (five) minutes as needed for chest pain. 25 tablet 3  . rosuvastatin (CRESTOR) 10 MG tablet Take 1 tablet (10 mg total) by mouth daily. 30 tablet 11  . tamsulosin (FLOMAX) 0.4 MG CAPS capsule Take 1 capsule (0.4 mg total) by mouth as needed. For difficulty urinating. 90 capsule 1   No current facility-administered medications on file prior to visit.     BP 140/84   Pulse 68   Temp 97.8 F (36.6 C) (Oral)   Ht 5\' 8"  (1.727 m)   Wt 195 lb (88.5 kg)   SpO2 99%   BMI 29.65 kg/m    Objective:   Physical Exam  Constitutional: He appears well-nourished.  Neck: Neck supple.  Cardiovascular: Normal rate and regular rhythm.   Pulmonary/Chest: Effort normal and breath sounds normal.  Skin: Skin is warm and dry.          Assessment & Plan:

## 2016-01-05 NOTE — Assessment & Plan Note (Signed)
Sugars running 200-300 on average despite Lanuts 20 units and Glyburide. Do not believe glyburide to be effective and will discontinue due to age. Will switch to Levemir 10 units BID for longer coverage. He will continue to monitor sugars and follow up in 1 month with sugar logs and repeat A1C. Foot exam next visit.

## 2016-01-05 NOTE — Patient Instructions (Signed)
Stop Lantus and glyburide medications for diabetes.  Start Levemir insulin for diabetes. Inject 10 units into the skin every morning and 10 units into the skin every evening.  Continue to monitor your blood sugars as discussed.  It is important that you improve your diet. Please limit carbohydrates in the form of white bread, rice, pasta, sweets, fast food, fried food, sugary drinks, etc. Increase your consumption of fresh fruits and vegetables, whole grains, lean protein.  Ensure you are consuming 64 ounces of water daily.  Start exercising. You should be getting 150 minutes of exercise weekly.  Follow up in 1 month for re-evaluation.  It was a pleasure to see you today!  Diabetes Mellitus and Food It is important for you to manage your blood sugar (glucose) level. Your blood glucose level can be greatly affected by what you eat. Eating healthier foods in the appropriate amounts throughout the day at about the same time each day will help you control your blood glucose level. It can also help slow or prevent worsening of your diabetes mellitus. Healthy eating may even help you improve the level of your blood pressure and reach or maintain a healthy weight. General recommendations for healthful eating and cooking habits include:  Eating meals and snacks regularly. Avoid going long periods of time without eating to lose weight.  Eating a diet that consists mainly of plant-based foods, such as fruits, vegetables, nuts, legumes, and whole grains.  Using low-heat cooking methods, such as baking, instead of high-heat cooking methods, such as deep frying. Work with your dietitian to make sure you understand how to use the Nutrition Facts information on food labels. How can food affect me? Carbohydrates  Carbohydrates affect your blood glucose level more than any other type of food. Your dietitian will help you determine how many carbohydrates to eat at each meal and teach you how to count  carbohydrates. Counting carbohydrates is important to keep your blood glucose at a healthy level, especially if you are using insulin or taking certain medicines for diabetes mellitus. Alcohol  Alcohol can cause sudden decreases in blood glucose (hypoglycemia), especially if you use insulin or take certain medicines for diabetes mellitus. Hypoglycemia can be a life-threatening condition. Symptoms of hypoglycemia (sleepiness, dizziness, and disorientation) are similar to symptoms of having too much alcohol. If your health care provider has given you approval to drink alcohol, do so in moderation and use the following guidelines:  Women should not have more than one drink per day, and men should not have more than two drinks per day. One drink is equal to:  12 oz of beer.  5 oz of wine.  1 oz of hard liquor.  Do not drink on an empty stomach.  Keep yourself hydrated. Have water, diet soda, or unsweetened iced tea.  Regular soda, juice, and other mixers might contain a lot of carbohydrates and should be counted. What foods are not recommended? As you make food choices, it is important to remember that all foods are not the same. Some foods have fewer nutrients per serving than other foods, even though they might have the same number of calories or carbohydrates. It is difficult to get your body what it needs when you eat foods with fewer nutrients. Examples of foods that you should avoid that are high in calories and carbohydrates but low in nutrients include:  Trans fats (most processed foods list trans fats on the Nutrition Facts label).  Regular soda.  Juice.  Candy.  Sweets, such as cake, pie, doughnuts, and cookies.  Fried foods. What foods can I eat? Eat nutrient-rich foods, which will nourish your body and keep you healthy. The food you should eat also will depend on several factors, including:  The calories you need.  The medicines you take.  Your weight.  Your blood  glucose level.  Your blood pressure level.  Your cholesterol level. You should eat a variety of foods, including:  Protein.  Lean cuts of meat.  Proteins low in saturated fats, such as fish, egg whites, and beans. Avoid processed meats.  Fruits and vegetables.  Fruits and vegetables that may help control blood glucose levels, such as apples, mangoes, and yams.  Dairy products.  Choose fat-free or low-fat dairy products, such as milk, yogurt, and cheese.  Grains, bread, pasta, and rice.  Choose whole grain products, such as multigrain bread, whole oats, and brown rice. These foods may help control blood pressure.  Fats.  Foods containing healthful fats, such as nuts, avocado, olive oil, canola oil, and fish. Does everyone with diabetes mellitus have the same meal plan? Because every person with diabetes mellitus is different, there is not one meal plan that works for everyone. It is very important that you meet with a dietitian who will help you create a meal plan that is just right for you. This information is not intended to replace advice given to you by your health care provider. Make sure you discuss any questions you have with your health care provider. Document Released: 09/15/2004 Document Revised: 05/27/2015 Document Reviewed: 11/15/2012 Elsevier Interactive Patient Education  2017 [employer].

## 2016-01-05 NOTE — Progress Notes (Signed)
Pre visit review using our clinic review tool, if applicable. No additional management support is needed unless otherwise documented below in the visit note. 

## 2016-01-07 ENCOUNTER — Ambulatory Visit: Payer: Medicare Other | Admitting: Primary Care

## 2016-01-10 ENCOUNTER — Ambulatory Visit (HOSPITAL_COMMUNITY): Payer: Medicare Other | Attending: Interventional Cardiology

## 2016-01-10 ENCOUNTER — Other Ambulatory Visit: Payer: Self-pay

## 2016-01-10 DIAGNOSIS — E119 Type 2 diabetes mellitus without complications: Secondary | ICD-10-CM | POA: Diagnosis not present

## 2016-01-10 DIAGNOSIS — I272 Pulmonary hypertension, unspecified: Secondary | ICD-10-CM | POA: Diagnosis not present

## 2016-01-10 DIAGNOSIS — I2510 Atherosclerotic heart disease of native coronary artery without angina pectoris: Secondary | ICD-10-CM | POA: Insufficient documentation

## 2016-01-10 DIAGNOSIS — I252 Old myocardial infarction: Secondary | ICD-10-CM | POA: Insufficient documentation

## 2016-01-10 DIAGNOSIS — I10 Essential (primary) hypertension: Secondary | ICD-10-CM | POA: Diagnosis not present

## 2016-02-08 ENCOUNTER — Encounter: Payer: Self-pay | Admitting: Primary Care

## 2016-02-08 ENCOUNTER — Ambulatory Visit (INDEPENDENT_AMBULATORY_CARE_PROVIDER_SITE_OTHER): Payer: Medicare Other | Admitting: Primary Care

## 2016-02-08 VITALS — BP 140/82 | HR 67 | Temp 97.5°F | Ht 68.0 in | Wt 180.8 lb

## 2016-02-08 DIAGNOSIS — I10 Essential (primary) hypertension: Secondary | ICD-10-CM | POA: Diagnosis not present

## 2016-02-08 DIAGNOSIS — E119 Type 2 diabetes mellitus without complications: Secondary | ICD-10-CM | POA: Diagnosis not present

## 2016-02-08 DIAGNOSIS — I2510 Atherosclerotic heart disease of native coronary artery without angina pectoris: Secondary | ICD-10-CM | POA: Diagnosis not present

## 2016-02-08 DIAGNOSIS — Z794 Long term (current) use of insulin: Secondary | ICD-10-CM

## 2016-02-08 LAB — HEMOGLOBIN A1C: Hgb A1c MFr Bld: 11.9 % — ABNORMAL HIGH (ref 4.6–6.5)

## 2016-02-08 NOTE — Patient Instructions (Signed)
Complete lab work prior to leaving today. I will notify you of your results once received.   We will adjust your insulin once I get your A1C results tomorrow.   Continue to monitor your blood sugars as discussed as we will call you in 2 weeks for those readings.  Congratulations on your weight loss!  It was a pleasure to see you today!

## 2016-02-08 NOTE — Assessment & Plan Note (Signed)
Home readings running 120/70's on average. Endorses history of white coat syndrome.

## 2016-02-08 NOTE — Progress Notes (Signed)
Subjective:    Patient ID: [name], male    DOB: June 06, 1945, 71 y.o.   MRN: [id number]  HPI  Mr. Gohr is a 71 year old male who presents today for follow up of diabetes.  He is currently managed on Levemir 10 units BID that was changed last visit as he experienced hyperglycemia despite Lantus and Glyburide. He was previously managed on Metformin which caused intolerable GI upset.  Since his last visit he's checking his sugars fasting in the morning, before lunch, before dinner. His fasting AM sugars are in the 250-290 range. His sugars will range on average from 200-375 with his highest number being 499. He was previously managed on Trulicity in the past which worked to reduce his sugars but was too expensive. He's lost 15 pounds through exercise and healthy diet.   Wt Readings from Last 3 Encounters:  02/08/16 180 lb 12.8 oz (82 kg)  01/05/16 195 lb (88.5 kg)  11/08/15 194 lb (88 kg)   He denies numbness/tingling, chest pain, visual changes.   Review of Systems  Constitutional: Negative for unexpected weight change.  Eyes: Negative for visual disturbance.  Respiratory: Negative for shortness of breath.   Cardiovascular: Negative for chest pain.  Neurological: Negative for dizziness and numbness.       Past Medical History:  Diagnosis Date  . Arthritis    "fingers, shoulders" (08/03/2015)  . Atrial fibrillation (HCC)    Paroxysmal, rare episodes, sinus rhythm on flecainide, patient prefers not to take Coumadin  . Bradycardia    April, 2013  . Chest pain    Nuclear, 2006, no ischemia  . Chronic lower back pain    "all my life"  . Coronary artery disease    s/p staged cath 05/12/2014 and 5/11, DES x 2 to heavily calcified RCA, residual with 60% prox LAD, 70% D1  . Ejection fraction    EF 55-60%,  echo, January, 2011  . Elevated CPK    CPK elevated with normal MB and normal troponin the past  . Heart murmur   . History of echocardiogram    Echo 8/16: EF 55%, normal  wall motion, grade 2 diastolic dysfunction, trivial MR, normal RV function, PASP 27 mmHg  . Hypertension   . IBS (irritable bowel syndrome)   . Myocardial infarction 06/2014  . Sinus drainage   . Type II diabetes mellitus ([place])      Social History   Social History  . Marital status: Married    Spouse name: N/A  . Number of children: N/A  . Years of education: N/A   Occupational History  . Not on file.   Social History Main Topics  . Smoking status: Never Smoker  . Smokeless tobacco: Never Used  . Alcohol use No  . Drug use: No  . Sexual activity: Not on file   Other Topics Concern  . Not on file   Social History Narrative  . No narrative on file    Past Surgical History:  Procedure Laterality Date  . APPENDECTOMY    . CARDIAC CATHETERIZATION N/A 05/12/2014   Procedure: Left Heart Cath and Coronary Angiography;  Surgeon: Troy Sine, MD;  Location: [place] [facility];  Service: Cardiovascular;  Laterality: N/A;  . CARDIAC CATHETERIZATION N/A 05/13/2014   Procedure: Coronary/Graft Atherectomy;  Surgeon: Troy Sine, MD;  Location: [place] [facility];  Service: Cardiovascular;  Laterality: N/A;  . CARDIAC CATHETERIZATION Right 05/13/2014   Procedure: Temporary Pacemaker;  Surgeon:  Troy Sine, MD;  Location: [place] [facility];  Service: Cardiovascular;  Laterality: Right;  . CARDIAC CATHETERIZATION N/A 05/13/2014   Procedure: Coronary Stent Intervention;  Surgeon: Troy Sine, MD;  Location: [place] [facility];  Service: Cardiovascular;  Laterality: N/A;  . CARDIAC CATHETERIZATION N/A 06/18/2014   Procedure: Left Heart Cath and Coronary Angiography;  Surgeon: Peter M Martinique, MD;  Location: [place] [facility];  Service: Cardiovascular;  Laterality: N/A;  . CARDIAC CATHETERIZATION N/A 08/03/2015   Procedure: Left Heart Cath and Cors/Grafts Angiography;  Surgeon: Nelva Bush, MD;  Location: [place] [facility];  Service: Cardiovascular;  Laterality: N/A;  .  CARDIOVASCULAR STRESS TEST  05/06/2014  . CATARACT EXTRACTION W/ INTRAOCULAR LENS IMPLANT Left   . CORONARY ARTERY BYPASS GRAFT N/A 06/22/2014   Procedure: CORONARY ARTERY BYPASS GRAFTING (CABG) x five, using left internal mammary artery, and right leg greater saphenous vein harvested endoscopically;  Surgeon: Melrose Nakayama, MD;  Location: [facility];  Service: Open Heart Surgery;  Laterality: N/A;  . LAPAROSCOPIC CHOLECYSTECTOMY    . MAZE N/A 06/22/2014   Procedure: MAZE;  Surgeon: Melrose Nakayama, MD;  Location: [place] [facility];  Service: Open Heart Surgery;  Laterality: N/A;  . TEE WITHOUT CARDIOVERSION N/A 06/22/2014   Procedure: TRANSESOPHAGEAL ECHOCARDIOGRAM (TEE);  Surgeon: Melrose Nakayama, MD;  Location: [place];  Service: Open Heart Surgery;  Laterality: N/A;    Family History  Problem Relation Age of Onset  . Alzheimer's disease Mother   . Emphysema Father   . Cancer Brother   . Arrhythmia Sister   . Heart attack Sister   . Heart disease Sister   . Hyperlipidemia Sister   . Hypertension Sister     Allergies  Allergen Reactions  . Lipitor [Atorvastatin] Other (See Comments)    Myopathy Spring 2006  . Pravastatin Other (See Comments)    LEG CRAMPS  . Simvastatin Other (See Comments)    LEG CRAMPS    Current Outpatient Prescriptions on File Prior to Visit  Medication Sig Dispense Refill  . apixaban (ELIQUIS) 5 MG TABS tablet Take 1 tablet (5 mg total) by mouth 2 (two) times daily. 60 tablet 8  . aspirin 81 MG tablet Take 81 mg by mouth daily.    . carvedilol (COREG) 6.25 MG tablet Take 1 tablet (6.25 mg total) by mouth 2 (two) times daily with a meal. 60 tablet 11  . dicyclomine (BENTYL) 20 MG tablet Take 1 tablet (20 mg total) by mouth 4 (four) times daily -  before meals and at bedtime. (Patient taking differently: Take 20 mg by mouth 2 (two) times daily. ) 120 tablet 2  . fluorometholone (FML) 0.1 % ophthalmic suspension Place 1 drop into the right eye daily as needed  (for eye redness and itching).     . fluticasone (FLONASE) 50 MCG/ACT nasal spray Place 1 spray into both nostrils as needed for allergies or rhinitis. 16 g 11  . ibuprofen (ADVIL,MOTRIN) 200 MG tablet Take 200 mg by mouth daily as needed for headache or moderate pain.     . Insulin Detemir (LEVEMIR FLEXPEN) 100 UNIT/ML Pen Inject 10 units into the skin every morning and inject 10 units into the skin every evening. 15 mL 11  . Insulin Pen Needle 31G X 8 MM MISC Use as directed with Lantus. 100 each 5  . isosorbide mononitrate (IMDUR) 30 MG 24 hr tablet Take 1 tablet (30 mg total) by mouth daily. 90 tablet 3  .  lisinopril (PRINIVIL,ZESTRIL) 5 MG tablet Take 1 tablet (5 mg total) by mouth daily. 90 tablet 3  . MAGNESIUM PO Take 1 tablet by mouth daily as needed (for leg cramping).     . nitroGLYCERIN (NITROSTAT) 0.4 MG SL tablet Place 1 tablet (0.4 mg total) under the tongue every 5 (five) minutes as needed for chest pain. 25 tablet 3  . rosuvastatin (CRESTOR) 10 MG tablet Take 1 tablet (10 mg total) by mouth daily. 30 tablet 11  . tamsulosin (FLOMAX) 0.4 MG CAPS capsule Take 1 capsule (0.4 mg total) by mouth as needed. For difficulty urinating. 90 capsule 1   No current facility-administered medications on file prior to visit.     BP 140/82   Pulse 67   Temp 97.5 F (36.4 C) (Oral)   Ht 5\' 8"  (1.727 m)   Wt 180 lb 12.8 oz (82 kg)   SpO2 98%   BMI 27.49 kg/m    Objective:   Physical Exam  Constitutional: He appears well-nourished.  Neck: Neck supple.  Cardiovascular: Normal rate and regular rhythm.   Pulmonary/Chest: Effort normal and breath sounds normal.  Skin: Skin is warm and dry.          Assessment & Plan:

## 2016-02-08 NOTE — Progress Notes (Signed)
Pre visit review using our clinic review tool, if applicable. No additional management support is needed unless otherwise documented below in the visit note. 

## 2016-02-08 NOTE — Assessment & Plan Note (Signed)
Hyperglycemia despite Levemir 10 units BID. Will obtain A1C and dose Levemir accordingly. Commended him on his weight loss efforts. After adjustment to Levemir, will call patient in 2 weeks for glucose readings.

## 2016-02-09 ENCOUNTER — Other Ambulatory Visit: Payer: Self-pay | Admitting: Primary Care

## 2016-02-09 ENCOUNTER — Other Ambulatory Visit: Payer: Self-pay | Admitting: *Deleted

## 2016-02-09 DIAGNOSIS — Z20828 Contact with and (suspected) exposure to other viral communicable diseases: Secondary | ICD-10-CM

## 2016-02-09 DIAGNOSIS — E119 Type 2 diabetes mellitus without complications: Secondary | ICD-10-CM

## 2016-02-09 DIAGNOSIS — R6889 Other general symptoms and signs: Secondary | ICD-10-CM

## 2016-02-09 DIAGNOSIS — Z794 Long term (current) use of insulin: Secondary | ICD-10-CM

## 2016-02-09 MED ORDER — INSULIN ISOPHANE & REGULAR (HUMAN 70-30)100 UNIT/ML KWIKPEN: 15.0000 [IU] | PEN_INJECTOR | Freq: Two times a day (BID) | SUBCUTANEOUS | 5 refills | Status: DC

## 2016-02-09 MED ORDER — INSULIN ASPART PROT & ASPART (70-30 MIX) 100 UNIT/ML PEN: 15.0000 [IU] | PEN_INJECTOR | Freq: Two times a day (BID) | SUBCUTANEOUS | 5 refills | Status: DC

## 2016-02-09 MED ORDER — OSELTAMIVIR PHOSPHATE 75 MG PO CAPS: 75.0000 mg | ORAL_CAPSULE | Freq: Two times a day (BID) | ORAL | 0 refills | Status: DC

## 2016-02-18 LAB — HM DIABETES EYE EXAM

## 2016-02-22 ENCOUNTER — Telehealth: Payer: Self-pay | Admitting: Primary Care

## 2016-02-22 DIAGNOSIS — Z794 Long term (current) use of insulin: Principal | ICD-10-CM

## 2016-02-22 DIAGNOSIS — E119 Type 2 diabetes mellitus without complications: Secondary | ICD-10-CM

## 2016-02-22 NOTE — Telephone Encounter (Signed)
-----   Message from [name], NP sent at 02/08/2016  8:56 AM EST ----- Regarding: Blood glucose Please check on patients blood sugars. 1. What are they running fasting? 2. What's his highest number? 3. What's his lowest number?

## 2016-02-23 NOTE — Telephone Encounter (Signed)
Spoken to patient and he stated that his sugar is about the same. Still ranging from 225-400. Patient stated every since the second heart attack, it has been hard to get his sugar down. Patient is willing to go back on Trulicity since that helped bring down his sugar before. The cost of will go up for him at the end of the year. However, if works he is willing to back to it. We also have a savings card for Trulicity as well.

## 2016-02-23 NOTE — Telephone Encounter (Signed)
How much of the Novolg 70/30 insulin is he injecting and how often? The chart says 15 units BID.  Will send Rx for Trulicity once I hear back from you.

## 2016-02-24 MED ORDER — DULAGLUTIDE 0.75 MG/0.5ML ~~LOC~~ SOAJ: 0.7500 mg | SUBCUTANEOUS | 1 refills | Status: DC

## 2016-02-24 NOTE — Telephone Encounter (Signed)
Patient stated that he is taking 15 units BID. Patient stated does [name] wants to increased or for patient to take Trulicity.  Patient notified that wife was in the office so I went ahead and gave her the savings card for Trulicity.

## 2016-02-24 NOTE — Telephone Encounter (Signed)
Spoken and notified patient of Kate's comments. Patient verbalized understanding. 

## 2016-02-24 NOTE — Telephone Encounter (Signed)
Please have him continue his current dose of Novolog 70/30 for now.  I sent trulicity to his pharmacy, he is to inject 0.75 mg once weekly. We will call him in 2 weeks for sugar readings.

## 2016-03-09 ENCOUNTER — Telehealth: Payer: Self-pay | Admitting: Primary Care

## 2016-03-09 NOTE — Telephone Encounter (Signed)
Patient stated that his blood sugar is running good. Fasting about 290 to 200. Bedtime about 200 to108. Patient stated that if is gets high like before he will let us know but it is slowly getting better.

## 2016-03-09 NOTE — Telephone Encounter (Signed)
-----   Message from [name], NP sent at 02/24/2016 12:54 PM EST ----- Regarding: Blood Sugars What are his blood sugars running? 1. Fasting? 2. Bedtime?

## 2016-03-09 NOTE — Telephone Encounter (Signed)
Noted, need to see him in the office for follow up in early May, please schedule.

## 2016-03-10 NOTE — Telephone Encounter (Signed)
Message left for patient to return my call.  

## 2016-03-14 NOTE — Telephone Encounter (Signed)
Per DPR, left detail message for patient to schedule follow up and come fasting.

## 2016-04-09 ENCOUNTER — Other Ambulatory Visit: Payer: Self-pay | Admitting: Primary Care

## 2016-04-09 DIAGNOSIS — E118 Type 2 diabetes mellitus with unspecified complications: Secondary | ICD-10-CM

## 2016-04-11 ENCOUNTER — Telehealth: Payer: Self-pay | Admitting: Interventional Cardiology

## 2016-04-11 NOTE — Telephone Encounter (Signed)
Patient calling and states that he started a new medication trulicity which caused his tier to change on all of his medications. Patient states that his Eliquis is $175/mo now and that he cannot afford it. Patient requesting samples. Only 1 box available and will be left at the front for patient to pick up. Patient asking if we can request for a tier reduction so that he can afford to continue the eliquis. Patient made aware that I would forward information to the PA nurse. Patient verbalized understanding.

## 2016-04-11 NOTE — Telephone Encounter (Signed)
Left message for patient to call back  

## 2016-04-11 NOTE — Telephone Encounter (Signed)
Patient calling states he has a question about his eliquis medication. Thanks.

## 2016-04-12 NOTE — Telephone Encounter (Signed)
**Note De-Identified [name] Obfuscation** I have completed Tier Exception on the pts Eliquis through covermymeds. Awaiting response.

## 2016-04-14 NOTE — Telephone Encounter (Signed)
**Note De-Identified Monika Chestang Obfuscation** The pt called back and gave me the info I needed to complete his tier exception on Eliquis through covermymeds.

## 2016-04-14 NOTE — Telephone Encounter (Addendum)
**Note De-Identified Hanna Aultman Obfuscation** Received a fax stating that I sent the pts tier exception request to the wrong insurance as the pt has both medicare and [place].   I have left a message asking the pt to call me back with info on insurance card that he uses to get his medication.

## 2016-04-17 NOTE — Telephone Encounter (Signed)
**Note De-Identified [name] Obfuscation** Received faxed questionnaire from [facility] on the pts Eliquis. Questionnaire completed and placed in Dr Hassell Done mail bin awaiting his signature.

## 2016-04-19 ENCOUNTER — Telehealth: Payer: Self-pay | Admitting: *Deleted

## 2016-04-19 NOTE — Telephone Encounter (Signed)
Additional information faxed to [facility] regarding prior authorization for [facility]

## 2016-04-19 NOTE — Telephone Encounter (Signed)
New Message   pt verbalized that he is returning call for rn

## 2016-04-21 NOTE — Telephone Encounter (Signed)
**Note De-Identified Brooklen Runquist Obfuscation** The pts wife is advised that the Tier Exception on pts Eliquis has been denied and that we have done an appeal. She is aware that we are awaiting response from [facility].

## 2016-04-24 NOTE — Telephone Encounter (Signed)
Reference# [id number] Received approval on the pts Eliquis tier exception. Approval good from 01/03/2016 until 01/01/2017.

## 2016-04-24 NOTE — Telephone Encounter (Signed)
The pt is aware.

## 2016-05-04 ENCOUNTER — Ambulatory Visit (INDEPENDENT_AMBULATORY_CARE_PROVIDER_SITE_OTHER): Payer: Medicare Other | Admitting: Primary Care

## 2016-05-04 ENCOUNTER — Encounter: Payer: Self-pay | Admitting: Primary Care

## 2016-05-04 VITALS — BP 134/84 | HR 69 | Temp 98.0°F | Ht 68.0 in | Wt 188.4 lb

## 2016-05-04 DIAGNOSIS — E119 Type 2 diabetes mellitus without complications: Secondary | ICD-10-CM

## 2016-05-04 DIAGNOSIS — I2510 Atherosclerotic heart disease of native coronary artery without angina pectoris: Secondary | ICD-10-CM

## 2016-05-04 DIAGNOSIS — Z794 Long term (current) use of insulin: Secondary | ICD-10-CM

## 2016-05-04 MED ORDER — INSULIN ASPART PROT & ASPART (70-30 MIX) 100 UNIT/ML PEN: 22.0000 [IU] | PEN_INJECTOR | Freq: Two times a day (BID) | SUBCUTANEOUS | 5 refills | Status: DC

## 2016-05-04 NOTE — Patient Instructions (Signed)
Complete lab work prior to leaving today. I will notify you of your results once received.   We've increased the Novolog 70/30 Mix to 22 units twice daily. Continue Trulicity once weekly.  Continue to monitor your sugars and we will call you in a few weeks to check on your readings.   It was a pleasure to see you today!

## 2016-05-04 NOTE — Progress Notes (Signed)
Pre visit review using our clinic review tool, if applicable. No additional management support is needed unless otherwise documented below in the visit note. 

## 2016-05-04 NOTE — Progress Notes (Signed)
Subjective:    Patient ID: [name], male    DOB: [birth year], 71 y.o.   MRN: [id number]  HPI  [name] is a 71 year old male who presents today for follow up of diabetes. He is currently managed on Trulicity 9.79 mg once weekly and Novolog 70/30 Mix 15 units twice daily.   He's checking his sugars at home twice daily, fasting in the morning (200-225), before bed (200-225). His lowest number was 138 and his highest number was 275. He endorses a healthy diet and is working out on his exercise bike daily. He is also active by playing golf.   He denies chest pain, dizziness, shortness of breath, numbness/tingling.   Wt Readings from Last 3 Encounters:  05/04/16 188 lb 6.4 oz (85.5 kg)  02/08/16 180 lb 12.8 oz (82 kg)  01/05/16 195 lb (88.5 kg)     Review of Systems  Eyes: Negative for visual disturbance.  Respiratory: Negative for shortness of breath.   Cardiovascular: Negative for chest pain.  Neurological: Negative for dizziness, numbness and headaches.       Past Medical History:  Diagnosis Date  . Arthritis    "fingers, shoulders" (08/03/2015)  . Atrial fibrillation (HCC)    Paroxysmal, rare episodes, sinus rhythm on flecainide, patient prefers not to take Coumadin  . Bradycardia    April, 2013  . Chest pain    Nuclear, 2006, no ischemia  . Chronic lower back pain    "all my life"  . Coronary artery disease    s/p staged cath 05/12/2014 and 5/11, DES x 2 to heavily calcified RCA, residual with 60% prox LAD, 70% D1  . Ejection fraction    EF 55-60%,  echo, January, 2011  . Elevated CPK    CPK elevated with normal MB and normal troponin the past  . Heart murmur   . History of echocardiogram    Echo 8/16: EF 55%, normal wall motion, grade 2 diastolic dysfunction, trivial MR, normal RV function, PASP 27 mmHg  . Hypertension   . IBS (irritable bowel syndrome)   . Myocardial infarction ([place]) 06/2014  . Sinus drainage   . Type II diabetes mellitus ([place])        Social History   Social History  . Marital status: Married    Spouse name: N/A  . Number of children: N/A  . Years of education: N/A   Occupational History  . Not on file.   Social History Main Topics  . Smoking status: Never Smoker  . Smokeless tobacco: Never Used  . Alcohol use No  . Drug use: No  . Sexual activity: Not on file   Other Topics Concern  . Not on file   Social History Narrative  . No narrative on file    Past Surgical History:  Procedure Laterality Date  . APPENDECTOMY    . CARDIAC CATHETERIZATION N/A 05/12/2014   Procedure: Left Heart Cath and Coronary Angiography;  Surgeon: Troy Sine, MD;  Location: [place] [facility];  Service: Cardiovascular;  Laterality: N/A;  . CARDIAC CATHETERIZATION N/A 05/13/2014   Procedure: Coronary/Graft Atherectomy;  Surgeon: Troy Sine, MD;  Location: [place] [facility];  Service: Cardiovascular;  Laterality: N/A;  . CARDIAC CATHETERIZATION Right 05/13/2014   Procedure: Temporary Pacemaker;  Surgeon: Troy Sine, MD;  Location: [place];  Service: Cardiovascular;  Laterality: Right;  . CARDIAC CATHETERIZATION N/A 05/13/2014   Procedure: Coronary Stent Intervention;  Surgeon: Joyice Faster  Claiborne Billings, MD;  Location: [facility];  Service: Cardiovascular;  Laterality: N/A;  . CARDIAC CATHETERIZATION N/A 06/18/2014   Procedure: Left Heart Cath and Coronary Angiography;  Surgeon: Peter M Martinique, MD;  Location: [place] [facility];  Service: Cardiovascular;  Laterality: N/A;  . CARDIAC CATHETERIZATION N/A 08/03/2015   Procedure: Left Heart Cath and Cors/Grafts Angiography;  Surgeon: Nelva Bush, MD;  Location: [facility];  Service: Cardiovascular;  Laterality: N/A;  . CARDIOVASCULAR STRESS TEST  05/06/2014  . CATARACT EXTRACTION W/ INTRAOCULAR LENS IMPLANT Left   . CORONARY ARTERY BYPASS GRAFT N/A 06/22/2014   Procedure: CORONARY ARTERY BYPASS GRAFTING (CABG) x five, using left internal mammary artery, and  right leg greater saphenous vein harvested endoscopically;  Surgeon: Melrose Nakayama, MD;  Location: [place] [facility];  Service: Open Heart Surgery;  Laterality: N/A;  . LAPAROSCOPIC CHOLECYSTECTOMY    . MAZE N/A 06/22/2014   Procedure: MAZE;  Surgeon: Melrose Nakayama, MD;  Location: [place] [facility];  Service: Open Heart Surgery;  Laterality: N/A;  . TEE WITHOUT CARDIOVERSION N/A 06/22/2014   Procedure: TRANSESOPHAGEAL ECHOCARDIOGRAM (TEE);  Surgeon: Melrose Nakayama, MD;  Location: [place];  Service: Open Heart Surgery;  Laterality: N/A;    Family History  Problem Relation Age of Onset  . Alzheimer's disease Mother   . Emphysema Father   . Cancer Brother   . Arrhythmia Sister   . Heart attack Sister   . Heart disease Sister   . Hyperlipidemia Sister   . Hypertension Sister     Allergies  Allergen Reactions  . Lipitor [Atorvastatin] Other (See Comments)    Myopathy Spring 2006  . Pravastatin Other (See Comments)    LEG CRAMPS  . Simvastatin Other (See Comments)    LEG CRAMPS    Current Outpatient Prescriptions on File Prior to Visit  Medication Sig Dispense Refill  . apixaban (ELIQUIS) 5 MG TABS tablet Take 1 tablet (5 mg total) by mouth 2 (two) times daily. 60 tablet 8  . aspirin 81 MG tablet Take 81 mg by mouth daily.    . carvedilol (COREG) 6.25 MG tablet Take 1 tablet (6.25 mg total) by mouth 2 (two) times daily with a meal. 60 tablet 11  . dicyclomine (BENTYL) 20 MG tablet Take 1 tablet (20 mg total) by mouth 4 (four) times daily -  before meals and at bedtime. (Patient taking differently: Take 20 mg by mouth 2 (two) times daily. ) 120 tablet 2  . Dulaglutide (TRULICITY) 8.25 KN/3.9JQ SOPN Inject 0.75 mg into the skin once a week. 6 mL 1  . fluorometholone (FML) 0.1 % ophthalmic suspension Place 1 drop into the right eye daily as needed (for eye redness and itching).     . fluticasone (FLONASE) 50 MCG/ACT nasal spray Place 1 spray into both nostrils as needed for allergies or  rhinitis. 16 g 11  . ibuprofen (ADVIL,MOTRIN) 200 MG tablet Take 200 mg by mouth daily as needed for headache or moderate pain.     . Insulin Pen Needle 31G X 8 MM MISC Use as directed with Lantus. 100 each 5  . isosorbide mononitrate (IMDUR) 30 MG 24 hr tablet Take 1 tablet (30 mg total) by mouth daily. 90 tablet 3  . lisinopril (PRINIVIL,ZESTRIL) 5 MG tablet Take 1 tablet (5 mg total) by mouth daily. 90 tablet 3  . MAGNESIUM PO Take 1 tablet by mouth daily as needed (for leg cramping).     . nitroGLYCERIN (NITROSTAT) 0.4  MG SL tablet Place 1 tablet (0.4 mg total) under the tongue every 5 (five) minutes as needed for chest pain. 25 tablet 3  . rosuvastatin (CRESTOR) 10 MG tablet Take 1 tablet (10 mg total) by mouth daily. 30 tablet 11  . tamsulosin (FLOMAX) 0.4 MG CAPS capsule Take 1 capsule (0.4 mg total) by mouth as needed. For difficulty urinating. 90 capsule 1   No current facility-administered medications on file prior to visit.     BP 134/84   Pulse 69   Temp 98 F (36.7 C) (Oral)   Ht 5' 8"  (1.727 m)   Wt 188 lb 6.4 oz (85.5 kg)   SpO2 99%   BMI 28.65 kg/m    Objective:   Physical Exam  Constitutional: He appears well-nourished.  Neck: Neck supple.  Cardiovascular: Normal rate and regular rhythm.   Pulmonary/Chest: Effort normal and breath sounds normal.  Skin: Skin is warm and dry.          Assessment & Plan:

## 2016-05-04 NOTE — Assessment & Plan Note (Signed)
Glucose overall improved, but not yet at goal. Continue Trulicity. Will increase Novolog 70/30 Mix to 22 units BID. Consider switching to [place] if no improvement by next A1C check. A1C pending today. Follow up in 3 months.

## 2016-05-05 LAB — HEMOGLOBIN A1C: HEMOGLOBIN A1C: 11.6 % — AB (ref 4.6–6.5)

## 2016-05-15 ENCOUNTER — Telehealth: Payer: Self-pay | Admitting: Interventional Cardiology

## 2016-05-15 ENCOUNTER — Telehealth: Payer: Self-pay | Admitting: Primary Care

## 2016-05-15 NOTE — Telephone Encounter (Signed)
I have spoken to patient. Patient stated that he has been having some dizzy spells. Patient is concern and even asked his cardio doctor who stated that he may be his blood sugar. Patient stated that his blood sugar goes up every time he eat something and goes down when he does not. Yesterday blood sugar was 209 and he wake up this morning and it was 300 when he checked it. Patient stated that he has been cutting down on sweets and limited certain things. What stated that he will have to stop the Trulicity due to cost. Patient stated that should continue as is or does [name] want to change.

## 2016-05-15 NOTE — Telephone Encounter (Signed)
New message    Pt c/o BP issue: STAT if pt c/o blurred vision, one-sided weakness or slurred speech  1. What are your last 5 BP readings? 136/76  2. Are you having any other symptoms (ex. Dizziness, headache, blurred vision, passed out)? Heart rate very slow, has not checked heart rate but pt states he knows when the heart rate is low  3. What is your BP issue? BP and heart rate issue, bp running a bit high with some dizziness and heart rate low.

## 2016-05-15 NOTE — Telephone Encounter (Signed)
Have him stop Trulicity after his current vial runs out, I certainly don't want him spending too much money on this.  Please get a log of his sugars:  Fasting in the AM: Before Lunch: Before Dinner: Bedtime:

## 2016-05-15 NOTE — Telephone Encounter (Signed)
Patient called and asked Vallarie Mare to call him back about his blood sugar.

## 2016-05-15 NOTE — Telephone Encounter (Signed)
PT FEELS  FINE TODAY   NOTED  DIZZY SPELL OVER THE WEEKEND  WHILE  OUT PLAYING GOLF  WITH   TEMPS   RUNNING  IN THE  LOW 90'S. B/P TODAY  IS   149/78 AND HR  73  . ALSO   NOTES   NOT  ABLE TO  GET  SUGAR   UNDER   BETTER  CONTROL   WILL CONTACT PMD   TO  DISCUSS  .PT  WILL CONTINUE  TO MONITOR  B/P AND  HR   AND  WILL CALL BACK  IF NO IMPROVEMENT  OR  HAS WORSENING S/S .  WILL FORWARD TO  DR  Irish Lack  FOR REVIEW .Adonis Housekeeper

## 2016-05-16 NOTE — Telephone Encounter (Signed)
Spoken and notified patient of Kate's comments. Patient verbalized understanding. 

## 2016-06-26 ENCOUNTER — Telehealth: Payer: Self-pay

## 2016-06-26 ENCOUNTER — Telehealth: Payer: Self-pay | Admitting: Interventional Cardiology

## 2016-06-26 ENCOUNTER — Other Ambulatory Visit: Payer: Self-pay | Admitting: Interventional Cardiology

## 2016-06-26 NOTE — Telephone Encounter (Signed)
New message     Pt received call saying he needs to make appt for blood work , there is no orders for blood work

## 2016-06-26 NOTE — Telephone Encounter (Signed)
Spoken and notified patient of [name]'s comments. Patient verbalized understanding.  Update medication list. Follow up on 07/10/2016

## 2016-06-26 NOTE — Telephone Encounter (Signed)
Let's have him increase his Novolog 70/30 mix to 30 units twice daily. Have him monitor his sugars. I'd like to see him back in the office in 2 weeks. Please schedule at his convenience.

## 2016-06-26 NOTE — Telephone Encounter (Signed)
Pt left /vm; pt was talking with cardiologist and one of his meds,rosuvastatin may cause blood sugar to be elevated. FBS averaging 225 -230 last couple of weeks. Once FBS got up to 400. Cardiologist wants pt to stay on rosuvastatin because of benefits to heart Pt wants to know what to do about the blood sugar med. Pt request cb.[facility].

## 2016-06-26 NOTE — Telephone Encounter (Signed)
Spoke with pt and he states that he received a call from "some company" telling him he needed to get our office to order some lab work because he is on the Eliquis.  Advised pt he did need a BMET as it should be checked every 6 months.  Pt states he has an upcoming appt with his PCP and she does a full panel of blood work for his annual physical.  Advised pt to continue with plan for labs at PCP office.  Pt appreciative for call.

## 2016-07-05 ENCOUNTER — Other Ambulatory Visit: Payer: Self-pay | Admitting: Physician Assistant

## 2016-07-06 NOTE — Telephone Encounter (Signed)
Please review for refill. Thanks!  

## 2016-07-10 ENCOUNTER — Encounter: Payer: Self-pay | Admitting: Primary Care

## 2016-07-10 ENCOUNTER — Ambulatory Visit (INDEPENDENT_AMBULATORY_CARE_PROVIDER_SITE_OTHER): Payer: Medicare Other | Admitting: Primary Care

## 2016-07-10 VITALS — BP 130/84 | HR 73 | Temp 97.9°F | Ht 68.0 in | Wt 194.4 lb

## 2016-07-10 DIAGNOSIS — I2510 Atherosclerotic heart disease of native coronary artery without angina pectoris: Secondary | ICD-10-CM

## 2016-07-10 DIAGNOSIS — Z794 Long term (current) use of insulin: Secondary | ICD-10-CM | POA: Diagnosis not present

## 2016-07-10 DIAGNOSIS — E119 Type 2 diabetes mellitus without complications: Secondary | ICD-10-CM | POA: Diagnosis not present

## 2016-07-10 MED ORDER — INSULIN ASPART 100 UNIT/ML FLEXPEN: 10.0000 [IU] | PEN_INJECTOR | Freq: Three times a day (TID) | SUBCUTANEOUS | 1 refills | Status: DC

## 2016-07-10 MED ORDER — INSULIN GLARGINE 100 UNIT/ML SOLOSTAR PEN: 20.0000 [IU] | PEN_INJECTOR | Freq: Every day | SUBCUTANEOUS | 1 refills | Status: DC

## 2016-07-10 NOTE — Patient Instructions (Addendum)
Start Lantus insulin. Inject 20 units into the skin every night at bedtime.  Start Novolog insulin (meal time insulin). Inject 10 units in the skin before meals for blood sugars above 120. You may adjust the Novolog by 2 units if sugars are above 120 two hours after a meal.   Please call me in 2 weeks with your sugar logs.   Stop the Novolin 70/30 insulin as discussed. Please call me if the Lantus and Novolog is too expensive. Consider switching to [place].  It was a pleasure to see you today!

## 2016-07-10 NOTE — Progress Notes (Signed)
Subjective:    Patient ID: [name], male    DOB: Jan 08, 1945, 71 y.o.   MRN: [id number]  HPI  [name] is a 71 year old male who presents today for follow up of Type 2 Diabetes. He is currently managed on Novolog 70/30 and is injecting 30 units BID (increased on 06/26/16). He cannot afford Trulicity and has therefore stopped using. He was previously managed on Metformin but could not tolerate due to diarrhea. He's been on Lantus/Levemir in the past but could not afford.   His last A1C was 11.6 in May 2018. His fasting morning sugars are running 215's, afternoon sugars without lunch 175-250, with lunch 280-350. He's not noticed improvement since the increase in Novolin to 30 units BID. He denies chest pain, dizziness, weakness.   Review of Systems  Constitutional: Negative for fatigue.  Respiratory: Negative for shortness of breath.   Cardiovascular: Negative for chest pain.  Neurological: Negative for weakness and numbness.       Past Medical History:  Diagnosis Date  . Arthritis    "fingers, shoulders" (08/03/2015)  . Atrial fibrillation (HCC)    Paroxysmal, rare episodes, sinus rhythm on flecainide, patient prefers not to take Coumadin  . Bradycardia    April, 2013  . Chest pain    Nuclear, 2006, no ischemia  . Chronic lower back pain    "all my life"  . Coronary artery disease    s/p staged cath 05/12/2014 and 5/11, DES x 2 to heavily calcified RCA, residual with 60% prox LAD, 70% D1  . Ejection fraction    EF 55-60%,  echo, January, 2011  . Elevated CPK    CPK elevated with normal MB and normal troponin the past  . Heart murmur   . History of echocardiogram    Echo 8/16: EF 55%, normal wall motion, grade 2 diastolic dysfunction, trivial MR, normal RV function, PASP 27 mmHg  . Hypertension   . IBS (irritable bowel syndrome)   . Myocardial infarction ([place]) 06/2014  . Sinus drainage   . Type II diabetes mellitus ([place])      Social History   Social History  .  Marital status: Married    Spouse name: N/A  . Number of children: N/A  . Years of education: N/A   Occupational History  . Not on file.   Social History Main Topics  . Smoking status: Never Smoker  . Smokeless tobacco: Never Used  . Alcohol use No  . Drug use: No  . Sexual activity: Not on file   Other Topics Concern  . Not on file   Social History Narrative  . No narrative on file    Past Surgical History:  Procedure Laterality Date  . APPENDECTOMY    . CARDIAC CATHETERIZATION N/A 05/12/2014   Procedure: Left Heart Cath and Coronary Angiography;  Surgeon: Troy Sine, MD;  Location: [place] [facility];  Service: Cardiovascular;  Laterality: N/A;  . CARDIAC CATHETERIZATION N/A 05/13/2014   Procedure: Coronary/Graft Atherectomy;  Surgeon: Troy Sine, MD;  Location: [facility];  Service: Cardiovascular;  Laterality: N/A;  . CARDIAC CATHETERIZATION Right 05/13/2014   Procedure: Temporary Pacemaker;  Surgeon: Troy Sine, MD;  Location: [facility] [facility];  Service: Cardiovascular;  Laterality: Right;  . CARDIAC CATHETERIZATION N/A 05/13/2014   Procedure: Coronary Stent Intervention;  Surgeon: Troy Sine, MD;  Location: [facility];  Service: Cardiovascular;  Laterality: N/A;  . CARDIAC CATHETERIZATION N/A  06/18/2014   Procedure: Left Heart Cath and Coronary Angiography;  Surgeon: Peter M Martinique, MD;  Location: [facility];  Service: Cardiovascular;  Laterality: N/A;  . CARDIAC CATHETERIZATION N/A 08/03/2015   Procedure: Left Heart Cath and Cors/Grafts Angiography;  Surgeon: Nelva Bush, MD;  Location: [facility];  Service: Cardiovascular;  Laterality: N/A;  . CARDIOVASCULAR STRESS TEST  05/06/2014  . CATARACT EXTRACTION W/ INTRAOCULAR LENS IMPLANT Left   . CORONARY ARTERY BYPASS GRAFT N/A 06/22/2014   Procedure: CORONARY ARTERY BYPASS GRAFTING (CABG) x five, using left internal mammary artery, and right leg greater saphenous vein harvested  endoscopically;  Surgeon: Melrose Nakayama, MD;  Location: [place];  Service: Open Heart Surgery;  Laterality: N/A;  . LAPAROSCOPIC CHOLECYSTECTOMY    . MAZE N/A 06/22/2014   Procedure: MAZE;  Surgeon: Melrose Nakayama, MD;  Location: [facility];  Service: Open Heart Surgery;  Laterality: N/A;  . TEE WITHOUT CARDIOVERSION N/A 06/22/2014   Procedure: TRANSESOPHAGEAL ECHOCARDIOGRAM (TEE);  Surgeon: Melrose Nakayama, MD;  Location: [facility];  Service: Open Heart Surgery;  Laterality: N/A;    Family History  Problem Relation Age of Onset  . Alzheimer's disease Mother   . Emphysema Father   . Cancer Brother   . Arrhythmia Sister   . Heart attack Sister   . Heart disease Sister   . Hyperlipidemia Sister   . Hypertension Sister     Allergies  Allergen Reactions  . Lipitor [Atorvastatin] Other (See Comments)    Myopathy Spring 2006  . Pravastatin Other (See Comments)    LEG CRAMPS  . Simvastatin Other (See Comments)    LEG CRAMPS    Current Outpatient Prescriptions on File Prior to Visit  Medication Sig Dispense Refill  . aspirin 81 MG tablet Take 81 mg by mouth daily.    . carvedilol (COREG) 6.25 MG tablet Take 1 tablet (6.25 mg total) by mouth 2 (two) times daily with a meal. 60 tablet 11  . dicyclomine (BENTYL) 20 MG tablet Take 1 tablet (20 mg total) by mouth 4 (four) times daily -  before meals and at bedtime. (Patient taking differently: Take 20 mg by mouth 2 (two) times daily. ) 120 tablet 2  . ELIQUIS 5 MG TABS tablet TAKE 1 TABLET BY MOUTH TWICE A DAY 60 tablet 5  . fluorometholone (FML) 0.1 % ophthalmic suspension Place 1 drop into the right eye daily as needed (for eye redness and itching).     . fluticasone (FLONASE) 50 MCG/ACT nasal spray Place 1 spray into both nostrils as needed for allergies or rhinitis. 16 g 11  . ibuprofen (ADVIL,MOTRIN) 200 MG tablet Take 200 mg by mouth daily as needed for headache or moderate pain.     . Insulin Pen Needle 31G X 8 MM MISC Use as  directed with Lantus. 100 each 5  . isosorbide mononitrate (IMDUR) 30 MG 24 hr tablet Take 1 tablet (30 mg total) by mouth daily. 90 tablet 3  . lisinopril (PRINIVIL,ZESTRIL) 5 MG tablet Take 1 tablet (5 mg total) by mouth daily. Overdue for follow up. Call and schedule for future refills 30 tablet 1  . MAGNESIUM PO Take 1 tablet by mouth daily as needed (for leg cramping).     . nitroGLYCERIN (NITROSTAT) 0.4 MG SL tablet Place 1 tablet (0.4 mg total) under the tongue every 5 (five) minutes as needed for chest pain. 25 tablet 3  . rosuvastatin (CRESTOR) 10 MG tablet Take 1  tablet (10 mg total) by mouth daily. 30 tablet 11  . tamsulosin (FLOMAX) 0.4 MG CAPS capsule Take 1 capsule (0.4 mg total) by mouth as needed. For difficulty urinating. 90 capsule 1   No current facility-administered medications on file prior to visit.     BP 130/84   Pulse 73   Temp 97.9 F (36.6 C) (Oral)   Ht 5\' 8"  (1.727 m)   Wt 194 lb 6.4 oz (88.2 kg)   SpO2 99%   BMI 29.56 kg/m    Objective:   Physical Exam  Constitutional: He appears well-nourished.  Neck: Neck supple.  Cardiovascular: Normal rate and regular rhythm.   Pulmonary/Chest: Effort normal and breath sounds normal.  Skin: Skin is warm and dry.          Assessment & Plan:

## 2016-07-10 NOTE — Assessment & Plan Note (Signed)
Glucose above goal on Novolin 70/30, 30 units BID. Long discussion today regarding options for treatment. In the past levemir, Lantus, Humalog/Novolog was too expensive, he would like to restart these as they were more effective. He can no longer afford Trulicity, will stop this. Stop Novolon 70/30. He refuses Metformin given GI symptoms.   Will start Lantus 20 units HS and Novolog TID at 10 units. He will call with readings in 2 weeks. Recheck A1C in August.

## 2016-07-19 ENCOUNTER — Other Ambulatory Visit: Payer: Self-pay | Admitting: Physician Assistant

## 2016-07-19 NOTE — Telephone Encounter (Signed)
Refill Request.  

## 2016-07-24 ENCOUNTER — Telehealth: Payer: Self-pay | Admitting: Primary Care

## 2016-07-24 NOTE — Telephone Encounter (Addendum)
-----   Message from [name], NP sent at 07/10/2016  9:13 AM EDT ----- Regarding: Blood Sugars Please check on blood sugars since we changed him to Lantus and Novolog. What are his fasting AM glucose levels, before meals, at bedtime?

## 2016-07-24 NOTE — Telephone Encounter (Signed)
Patient states his blood sugars have been averaging 245 - 375 fasting Bedtime readings have averaged 323-350 Patient says he ate at 6 pm, took his BS at bedtime and it was 190.  Didn't eat anything else until mid morning the next day and his BS was 337.

## 2016-07-24 NOTE — Telephone Encounter (Signed)
We need to increase Lantus to 26 units at bedtime and Novolog to 14 units TID with meals for sugars above 120. Please update this in his chart. We will call him for readings again in several weeks.

## 2016-07-25 NOTE — Telephone Encounter (Signed)
Spoken and notified patient of Kate's comments. Patient verbalized understanding.  Updated the medication list.

## 2016-07-26 ENCOUNTER — Encounter: Payer: Self-pay | Admitting: Primary Care

## 2016-07-26 ENCOUNTER — Ambulatory Visit (INDEPENDENT_AMBULATORY_CARE_PROVIDER_SITE_OTHER): Payer: Medicare Other | Admitting: Primary Care

## 2016-07-26 VITALS — BP 126/82 | HR 59 | Temp 97.9°F | Ht 68.0 in | Wt 193.8 lb

## 2016-07-26 DIAGNOSIS — I2510 Atherosclerotic heart disease of native coronary artery without angina pectoris: Secondary | ICD-10-CM

## 2016-07-26 DIAGNOSIS — M25511 Pain in right shoulder: Secondary | ICD-10-CM

## 2016-07-26 MED ORDER — TRAMADOL HCL 50 MG PO TABS: 50.0000 mg | ORAL_TABLET | Freq: Two times a day (BID) | ORAL | 0 refills | Status: DC | PRN

## 2016-07-26 NOTE — Progress Notes (Signed)
Subjective:    Patient ID: [name], male    DOB: 06/23/1945, 71 y.o.   MRN: [id number]  HPI  [name] is a 71 year old male with a history of right and left rotator cuff tendinitis, bursitis of the hip, arthritis who presents today with a chief complaint of shoulder pain. His pain is located to the right shoulder. Previously received steroid injections to this shoulder with prior PCP. His last injection was two years ago.  His shoulder has been painful with limited ROM for the past 2 weeks. He's taking Advil a few times with some improvement. He denies recent injury or trauma, numbness/tingling, radiation of pain to his extremity.   Review of Systems  Musculoskeletal: Positive for arthralgias.       Right shoulder pain, stiffness  Skin: Negative for color change.  Neurological: Negative for numbness.       Past Medical History:  Diagnosis Date  . Arthritis    "fingers, shoulders" (08/03/2015)  . Atrial fibrillation (HCC)    Paroxysmal, rare episodes, sinus rhythm on flecainide, patient prefers not to take Coumadin  . Bradycardia    April, 2013  . Chest pain    Nuclear, 2006, no ischemia  . Chronic lower back pain    "all my life"  . Coronary artery disease    s/p staged cath 05/12/2014 and 5/11, DES x 2 to heavily calcified RCA, residual with 60% prox LAD, 70% D1  . Ejection fraction    EF 55-60%,  echo, January, 2011  . Elevated CPK    CPK elevated with normal MB and normal troponin the past  . Heart murmur   . History of echocardiogram    Echo 8/16: EF 55%, normal wall motion, grade 2 diastolic dysfunction, trivial MR, normal RV function, PASP 27 mmHg  . Hypertension   . IBS (irritable bowel syndrome)   . Myocardial infarction ([place]) 06/2014  . Sinus drainage   . Type II diabetes mellitus ([place])      Social History   Social History  . Marital status: Married    Spouse name: N/A  . Number of children: N/A  . Years of education: N/A   Occupational History  .  Not on file.   Social History Main Topics  . Smoking status: Never Smoker  . Smokeless tobacco: Never Used  . Alcohol use No  . Drug use: No  . Sexual activity: Not on file   Other Topics Concern  . Not on file   Social History Narrative  . No narrative on file    Past Surgical History:  Procedure Laterality Date  . APPENDECTOMY    . CARDIAC CATHETERIZATION N/A 05/12/2014   Procedure: Left Heart Cath and Coronary Angiography;  Surgeon: Troy Sine, MD;  Location: [facility];  Service: Cardiovascular;  Laterality: N/A;  . CARDIAC CATHETERIZATION N/A 05/13/2014   Procedure: Coronary/Graft Atherectomy;  Surgeon: Troy Sine, MD;  Location: [facility];  Service: Cardiovascular;  Laterality: N/A;  . CARDIAC CATHETERIZATION Right 05/13/2014   Procedure: Temporary Pacemaker;  Surgeon: Troy Sine, MD;  Location: [facility];  Service: Cardiovascular;  Laterality: Right;  . CARDIAC CATHETERIZATION N/A 05/13/2014   Procedure: Coronary Stent Intervention;  Surgeon: Troy Sine, MD;  Location: [facility];  Service: Cardiovascular;  Laterality: N/A;  . CARDIAC CATHETERIZATION N/A 06/18/2014   Procedure: Left Heart Cath and Coronary Angiography;  Surgeon: Peter M Martinique, MD;  Location: [facility];  Service: Cardiovascular;  Laterality: N/A;  . CARDIAC CATHETERIZATION N/A 08/03/2015   Procedure: Left Heart Cath and Cors/Grafts Angiography;  Surgeon: Nelva Bush, MD;  Location: [facility];  Service: Cardiovascular;  Laterality: N/A;  . CARDIOVASCULAR STRESS TEST  05/06/2014  . CATARACT EXTRACTION W/ INTRAOCULAR LENS IMPLANT Left   . CORONARY ARTERY BYPASS GRAFT N/A 06/22/2014   Procedure: CORONARY ARTERY BYPASS GRAFTING (CABG) x five, using left internal mammary artery, and right leg greater saphenous vein harvested endoscopically;  Surgeon: Melrose Nakayama, MD;  Location: [facility];  Service: Open Heart Surgery;  Laterality: N/A;  .  LAPAROSCOPIC CHOLECYSTECTOMY    . MAZE N/A 06/22/2014   Procedure: MAZE;  Surgeon: Melrose Nakayama, MD;  Location: [facility];  Service: Open Heart Surgery;  Laterality: N/A;  . TEE WITHOUT CARDIOVERSION N/A 06/22/2014   Procedure: TRANSESOPHAGEAL ECHOCARDIOGRAM (TEE);  Surgeon: Melrose Nakayama, MD;  Location: [facility];  Service: Open Heart Surgery;  Laterality: N/A;    Family History  Problem Relation Age of Onset  . Alzheimer's disease Mother   . Emphysema Father   . Cancer Brother   . Arrhythmia Sister   . Heart attack Sister   . Heart disease Sister   . Hyperlipidemia Sister   . Hypertension Sister     Allergies  Allergen Reactions  . Lipitor [Atorvastatin] Other (See Comments)    Myopathy Spring 2006  . Pravastatin Other (See Comments)    LEG CRAMPS  . Simvastatin Other (See Comments)    LEG CRAMPS    Current Outpatient Prescriptions on File Prior to Visit  Medication Sig Dispense Refill  . aspirin 81 MG tablet Take 81 mg by mouth daily.    . carvedilol (COREG) 6.25 MG tablet TAKE 1 TABLET BY MOUTH TWICE A DAY WITH MEALS 60 tablet 1  . dicyclomine (BENTYL) 20 MG tablet Take 1 tablet (20 mg total) by mouth 4 (four) times daily -  before meals and at bedtime. (Patient taking differently: Take 20 mg by mouth 2 (two) times daily. ) 120 tablet 2  . ELIQUIS 5 MG TABS tablet TAKE 1 TABLET BY MOUTH TWICE A DAY 60 tablet 5  . fluorometholone (FML) 0.1 % ophthalmic suspension Place 1 drop into the right eye daily as needed (for eye redness and itching).     . fluticasone (FLONASE) 50 MCG/ACT nasal spray Place 1 spray into both nostrils as needed for allergies or rhinitis. 16 g 11  . ibuprofen (ADVIL,MOTRIN) 200 MG tablet Take 200 mg by mouth daily as needed for headache or moderate pain.     Marland Kitchen insulin aspart (NOVOLOG FLEXPEN) 100 UNIT/ML FlexPen Inject 10 Units into the skin 3 (three) times daily with meals. (Patient taking differently: Inject 14 Units into the skin 3 (three) times  daily with meals. ) 30 mL 1  . Insulin Glargine (LANTUS SOLOSTAR) 100 UNIT/ML Solostar Pen Inject 20 Units into the skin daily at 10 pm. (Patient taking differently: Inject 26 Units into the skin daily at 10 pm. ) 5 pen 1  . Insulin Pen Needle 31G X 8 MM MISC Use as directed with Lantus. 100 each 5  . isosorbide mononitrate (IMDUR) 30 MG 24 hr tablet Take 1 tablet (30 mg total) by mouth daily. 90 tablet 3  . lisinopril (PRINIVIL,ZESTRIL) 5 MG tablet Take 1 tablet (5 mg total) by mouth daily. Overdue for follow up. Call and schedule for future refills 30 tablet 1  .  MAGNESIUM PO Take 1 tablet by mouth daily as needed (for leg cramping).     . nitroGLYCERIN (NITROSTAT) 0.4 MG SL tablet Place 1 tablet (0.4 mg total) under the tongue every 5 (five) minutes as needed for chest pain. 25 tablet 3  . rosuvastatin (CRESTOR) 10 MG tablet TAKE 1 TABLET BY MOUTH EVERY DAY 30 tablet 1  . tamsulosin (FLOMAX) 0.4 MG CAPS capsule Take 1 capsule (0.4 mg total) by mouth as needed. For difficulty urinating. 90 capsule 1   No current facility-administered medications on file prior to visit.     BP 126/82   Pulse (!) 59   Temp 97.9 F (36.6 C) (Oral)   Ht 5\' 8"  (1.727 m)   Wt 193 lb 12.8 oz (87.9 kg)   SpO2 98%   BMI 29.47 kg/m    Objective:   Physical Exam  Constitutional: He appears well-nourished.  Neck: Neck supple.  Cardiovascular: Normal rate and regular rhythm.   Pulmonary/Chest: Effort normal and breath sounds normal.  Musculoskeletal:       Right shoulder: He exhibits decreased range of motion and pain. He exhibits no tenderness, no spasm and normal strength.  Decrease in ROM with lateral abduction, forward abduction, posterior abduction.  Skin: Skin is warm and dry.          Assessment & Plan:  Acute on Chronic Shoulder Pain:  Decrease in ROM with pain on exam. Given history of heart disease with CABG will avoid NSAID's. Given history of poorly controlled diabetes, will avoid oral  steroids. Discussed Tylenol use for moderate pain, use Tramadol PRN for more severe pain. Discussed stretching, heat/ice. He will see sports med if no improvement in 1-2 weeks.  Sheral Flow, NP

## 2016-07-26 NOTE — Patient Instructions (Signed)
Avoid NSAID's like Advil, naproxen, Aleve, Motrin given your history of heart disease.  You may take extra strength tylenol for moderate pain. You may take the Tramadol as needed for severe pain. Use this sparingly.   Schedule an appointment with Dr. Lorelei Pont if no improvement in your shoulder pain.  It was a pleasure to see you today!

## 2016-07-27 ENCOUNTER — Ambulatory Visit: Payer: Medicare Other | Admitting: Primary Care

## 2016-07-28 ENCOUNTER — Other Ambulatory Visit: Payer: Self-pay

## 2016-07-29 ENCOUNTER — Other Ambulatory Visit: Payer: Self-pay | Admitting: Physician Assistant

## 2016-07-31 NOTE — Telephone Encounter (Signed)
Please review for refill, Thanks !  

## 2016-08-07 ENCOUNTER — Telehealth: Payer: Self-pay | Admitting: Primary Care

## 2016-08-07 NOTE — Telephone Encounter (Signed)
Low, mid, high 200's? Which one? Is this fasting? Before meals? At bedtime. Thanks.

## 2016-08-07 NOTE — Telephone Encounter (Signed)
Spoken to patient and he stated that blood sugar been running better. Most of blood sugar reading have been in the 200s.

## 2016-08-07 NOTE — Telephone Encounter (Signed)
-----   Message from [name], NP sent at 07/24/2016  5:38 PM EDT ----- Regarding: Blood Glucose How are his blood sugars since we increased Lantus to 26 units HS and Novolog to 14 units TID with meals?

## 2016-08-08 NOTE — Telephone Encounter (Signed)
Spoken to patient. He stated that in the morning, it has been between 245-250, lunch between 200-278, and dinner between 200-300.  Patient stated that he does not always checked it at night but when he does it is usually around 300.  Patient also wanted [name] to know that patient have 2 glucose meter. He stated that he would check his sugar with one and it would be 250 then he would check it again with the second meter, it would be 300. Patient stated that he is concern that the two meters would be so different. I have asked if both meter have been calibrated and patient stated yes.

## 2016-08-08 NOTE — Telephone Encounter (Signed)
Please have him increase his Lantus (night time) insulin to 30 units once nightly, increase his Novolog to 18 units three times daily before meals for sugars greater than 120. Please adjust these new doses in his med list.   Have him use the newest meter, only one meter from here on out.  We will see him in a few weeks. Please have him bring his sugar logs with the new insulin dose.

## 2016-08-08 NOTE — Telephone Encounter (Signed)
Spoken and notified patient of Kate's comments. Patient verbalized understanding. 

## 2016-08-23 ENCOUNTER — Other Ambulatory Visit: Payer: Self-pay | Admitting: Primary Care

## 2016-08-23 DIAGNOSIS — E785 Hyperlipidemia, unspecified: Secondary | ICD-10-CM

## 2016-08-23 DIAGNOSIS — Z794 Long term (current) use of insulin: Principal | ICD-10-CM

## 2016-08-23 DIAGNOSIS — E119 Type 2 diabetes mellitus without complications: Secondary | ICD-10-CM

## 2016-08-24 ENCOUNTER — Ambulatory Visit (INDEPENDENT_AMBULATORY_CARE_PROVIDER_SITE_OTHER): Payer: Medicare Other

## 2016-08-24 VITALS — BP 118/72 | HR 69 | Temp 97.8°F | Ht 67.0 in | Wt 193.5 lb

## 2016-08-24 DIAGNOSIS — E785 Hyperlipidemia, unspecified: Secondary | ICD-10-CM

## 2016-08-24 DIAGNOSIS — Z0000 Encounter for general adult medical examination without abnormal findings: Secondary | ICD-10-CM

## 2016-08-24 DIAGNOSIS — Z794 Long term (current) use of insulin: Secondary | ICD-10-CM | POA: Diagnosis not present

## 2016-08-24 DIAGNOSIS — E119 Type 2 diabetes mellitus without complications: Secondary | ICD-10-CM

## 2016-08-24 DIAGNOSIS — Z23 Encounter for immunization: Secondary | ICD-10-CM | POA: Diagnosis not present

## 2016-08-24 LAB — COMPREHENSIVE METABOLIC PANEL
ALT: 27 U/L (ref 0–53)
AST: 19 U/L (ref 0–37)
Albumin: 4.3 g/dL (ref 3.5–5.2)
Alkaline Phosphatase: 50 U/L (ref 39–117)
BILIRUBIN TOTAL: 0.7 mg/dL (ref 0.2–1.2)
BUN: 17 mg/dL (ref 6–23)
CALCIUM: 9.9 mg/dL (ref 8.4–10.5)
CHLORIDE: 100 meq/L (ref 96–112)
CO2: 27 meq/L (ref 19–32)
Creatinine, Ser: 1.05 mg/dL (ref 0.40–1.50)
GFR: 73.96 mL/min (ref >60.00)
GLUCOSE: 321 mg/dL — AB (ref 70–99)
Potassium: 4.8 mEq/L (ref 3.5–5.1)
Sodium: 133 mEq/L — ABNORMAL LOW (ref 135–145)
Total Protein: 7.1 g/dL (ref 6.0–8.3)

## 2016-08-24 LAB — LIPID PANEL
CHOLESTEROL: 104 mg/dL (ref 0–200)
HDL: 40 mg/dL (ref >39.00)
LDL CALC: 50 mg/dL (ref 0–99)
NonHDL: 64.12
TRIGLYCERIDES: 72 mg/dL (ref 0.0–149.0)
Total CHOL/HDL Ratio: 3
VLDL: 14.4 mg/dL (ref 0.0–40.0)

## 2016-08-24 LAB — HEMOGLOBIN A1C: Hgb A1c MFr Bld: 11.5 % — ABNORMAL HIGH (ref 4.6–6.5)

## 2016-08-24 NOTE — Patient Instructions (Signed)
[name] , Thank you for taking time to come for your Medicare Wellness Visit. I appreciate your ongoing commitment to your health goals. Please review the following plan we discussed and let me know if I can assist you in the future.   These are the goals we discussed: Goals    . Increase physical activity          Starting 08/24/16, I will continue to play golf for at least 4 hours 3 days per week and do yard work as needed.        This is a list of the screening recommended for you and due dates:  Health Maintenance  Topic Date Due  . Flu Shot  04/01/2017*  . Complete foot exam   09/23/2016  . Hemoglobin A1C  11/04/2016  . Eye exam for diabetics  02/17/2017  . Colon Cancer Screening  04/29/2018  . Tetanus Vaccine  01/15/2021  .  Hepatitis C: One time screening is recommended by Center for Disease Control  (CDC) for  adults born from [birth year] through 1965.   Completed  . Pneumonia vaccines  Completed  *Topic was postponed. The date shown is not the original due date.   Preventive Care for Adults  A healthy lifestyle and preventive care can promote health and wellness. Preventive health guidelines for adults include the following key practices.  . A routine yearly physical is a good way to check with your health care provider about your health and preventive screening. It is a chance to share any concerns and updates on your health and to receive a thorough exam.  . Visit your dentist for a routine exam and preventive care every 6 months. Brush your teeth twice a day and floss once a day. Good oral hygiene prevents tooth decay and gum disease.  . The frequency of eye exams is based on your age, health, family medical history, use  of contact lenses, and other factors. Follow your health care provider's ecommendations for frequency of eye exams.  . Eat a healthy diet. Foods like vegetables, fruits, whole grains, low-fat dairy products, and lean protein foods contain the nutrients you  need without too many calories. Decrease your intake of foods high in solid fats, added sugars, and salt. Eat the right amount of calories for you. Get information about a proper diet from your health care provider, if necessary.  . Regular physical exercise is one of the most important things you can do for your health. Most adults should get at least 150 minutes of moderate-intensity exercise (any activity that increases your heart rate and causes you to sweat) each week. In addition, most adults need muscle-strengthening exercises on 2 or more days a week.  Silver Sneakers may be a benefit available to you. To determine eligibility, you may visit the website: www.silversneakers.com or contact program at 843-770-1803 Mon-Fri between 8AM-8PM.   . Maintain a healthy weight. The body mass index (BMI) is a screening tool to identify possible weight problems. It provides an estimate of body fat based on height and weight. Your health care provider can find your BMI and can help you achieve or maintain a healthy weight.   For adults 20 years and older: ? A BMI below 18.5 is considered underweight. ? A BMI of 18.5 to 24.9 is normal. ? A BMI of 25 to 29.9 is considered overweight. ? A BMI of 30 and above is considered obese.   . Maintain normal blood lipids and cholesterol levels by  exercising and minimizing your intake of saturated fat. Eat a balanced diet with plenty of fruit and vegetables. Blood tests for lipids and cholesterol should begin at age 46 and be repeated every 5 years. If your lipid or cholesterol levels are high, you are over 50, or you are at high risk for heart disease, you may need your cholesterol levels checked more frequently. Ongoing high lipid and cholesterol levels should be treated with medicines if diet and exercise are not working.  . If you smoke, find out from your health care provider how to quit. If you do not use tobacco, please do not start.  . If you choose to drink  alcohol, please do not consume more than 2 drinks per day. One drink is considered to be 12 ounces (355 mL) of beer, 5 ounces (148 mL) of wine, or 1.5 ounces (44 mL) of liquor.  . If you are 51-63 years old, ask your health care provider if you should take aspirin to prevent strokes.  . Use sunscreen. Apply sunscreen liberally and repeatedly throughout the day. You should seek shade when your shadow is shorter than you. Protect yourself by wearing long sleeves, pants, a wide-brimmed hat, and sunglasses year round, whenever you are outdoors.  . Once a month, do a whole body skin exam, using a mirror to look at the skin on your back. Tell your health care provider of new moles, moles that have irregular borders, moles that are larger than a pencil eraser, or moles that have changed in shape or color.

## 2016-08-24 NOTE — Progress Notes (Signed)
PCP notes:   Health maintenance:  Flu vaccine - addressed PPSV23 - administered  Abnormal screenings:   Hearing - failed Depression score: 2 Mini-Cog score: 19/20  Patient concerns:   None  Nurse concerns:  None  Next PCP appt:   08/31/16 @ 0730

## 2016-08-24 NOTE — Progress Notes (Signed)
Subjective:   [name] is a 71 y.o. male who presents for Medicare Annual/Subsequent preventive examination.  Review of Systems:  N/A Cardiac Risk Factors include: advanced age (>22men, >77 women);male gender;diabetes mellitus;dyslipidemia;hypertension     Objective:    Vitals: BP 118/72 (BP Location: Right Arm, Patient Position: Sitting, Cuff Size: Normal)   Pulse 69   Temp 97.8 F (36.6 C) (Oral)   Ht 5\' 7"  (1.702 m) Comment: no shoes  Wt 193 lb 8 oz (87.8 kg)   SpO2 98%   BMI 30.31 kg/m   Body mass index is 30.31 kg/m.  Tobacco History  Smoking Status  . Never Smoker  Smokeless Tobacco  . Never Used     Counseling given: Not Answered   Past Medical History:  Diagnosis Date  . Arthritis    "fingers, shoulders" (08/03/2015)  . Atrial fibrillation (HCC)    Paroxysmal, rare episodes, sinus rhythm on flecainide, patient prefers not to take Coumadin  . Bradycardia    April, 2013  . Chest pain    Nuclear, 2006, no ischemia  . Chronic lower back pain    "all my life"  . Coronary artery disease    s/p staged cath 05/12/2014 and 5/11, DES x 2 to heavily calcified RCA, residual with 60% prox LAD, 70% D1  . Ejection fraction    EF 55-60%,  echo, January, 2011  . Elevated CPK    CPK elevated with normal MB and normal troponin the past  . Heart murmur   . History of echocardiogram    Echo 8/16: EF 55%, normal wall motion, grade 2 diastolic dysfunction, trivial MR, normal RV function, PASP 27 mmHg  . Hypertension   . IBS (irritable bowel syndrome)   . Myocardial infarction ([place]) 06/2014  . Sinus drainage   . Type II diabetes mellitus ([place])    Past Surgical History:  Procedure Laterality Date  . APPENDECTOMY    . CARDIAC CATHETERIZATION N/A 05/12/2014   Procedure: Left Heart Cath and Coronary Angiography;  Surgeon: Troy Sine, MD;  Location: [place] [facility];  Service: Cardiovascular;  Laterality: N/A;  . CARDIAC CATHETERIZATION N/A 05/13/2014   Procedure: Coronary/Graft Atherectomy;  Surgeon: Troy Sine, MD;  Location: [place] [facility];  Service: Cardiovascular;  Laterality: N/A;  . CARDIAC CATHETERIZATION Right 05/13/2014   Procedure: Temporary Pacemaker;  Surgeon: Troy Sine, MD;  Location: [facility];  Service: Cardiovascular;  Laterality: Right;  . CARDIAC CATHETERIZATION N/A 05/13/2014   Procedure: Coronary Stent Intervention;  Surgeon: Troy Sine, MD;  Location: [facility];  Service: Cardiovascular;  Laterality: N/A;  . CARDIAC CATHETERIZATION N/A 06/18/2014   Procedure: Left Heart Cath and Coronary Angiography;  Surgeon: Peter M Martinique, MD;  Location: [facility] [facility];  Service: Cardiovascular;  Laterality: N/A;  . CARDIAC CATHETERIZATION N/A 08/03/2015   Procedure: Left Heart Cath and Cors/Grafts Angiography;  Surgeon: Nelva Bush, MD;  Location: [facility];  Service: Cardiovascular;  Laterality: N/A;  . CARDIOVASCULAR STRESS TEST  05/06/2014  . CATARACT EXTRACTION W/ INTRAOCULAR LENS IMPLANT Left   . CORONARY ARTERY BYPASS GRAFT N/A 06/22/2014   Procedure: CORONARY ARTERY BYPASS GRAFTING (CABG) x five, using left internal mammary artery, and right leg greater saphenous vein harvested endoscopically;  Surgeon: Melrose Nakayama, MD;  Location: [facility];  Service: Open Heart Surgery;  Laterality: N/A;  . LAPAROSCOPIC CHOLECYSTECTOMY    . MAZE N/A 06/22/2014   Procedure: MAZE;  Surgeon: Melrose Nakayama, MD;  Location: [facility];  Service: Open Heart Surgery;  Laterality: N/A;  . TEE WITHOUT CARDIOVERSION N/A 06/22/2014   Procedure: TRANSESOPHAGEAL ECHOCARDIOGRAM (TEE);  Surgeon: Melrose Nakayama, MD;  Location: [place];  Service: Open Heart Surgery;  Laterality: N/A;   Family History  Problem Relation Age of Onset  . Alzheimer's disease Mother   . Emphysema Father   . Cancer Brother   . Arrhythmia Sister   . Heart attack Sister   . Heart disease Sister   . Hyperlipidemia Sister   .  Hypertension Sister    History  Sexual Activity  . Sexual activity: Not on file    Outpatient Encounter Prescriptions as of 08/24/2016  Medication Sig  . aspirin 81 MG tablet Take 81 mg by mouth daily.  . carvedilol (COREG) 6.25 MG tablet TAKE 1 TABLET BY MOUTH TWICE A DAY WITH MEALS  . dicyclomine (BENTYL) 20 MG tablet Take 1 tablet (20 mg total) by mouth 4 (four) times daily -  before meals and at bedtime. (Patient taking differently: Take 20 mg by mouth 2 (two) times daily. )  . ELIQUIS 5 MG TABS tablet TAKE 1 TABLET BY MOUTH TWICE A DAY  . fluorometholone (FML) 0.1 % ophthalmic suspension Place 1 drop into the right eye daily as needed (for eye redness and itching).   . fluticasone (FLONASE) 50 MCG/ACT nasal spray Place 1 spray into both nostrils as needed for allergies or rhinitis.  Marland Kitchen ibuprofen (ADVIL,MOTRIN) 200 MG tablet Take 200 mg by mouth daily as needed for headache or moderate pain.   Marland Kitchen insulin aspart (NOVOLOG FLEXPEN) 100 UNIT/ML FlexPen Inject 10 Units into the skin 3 (three) times daily with meals. (Patient taking differently: Inject 18 Units into the skin 3 (three) times daily with meals. )  . Insulin Glargine (LANTUS SOLOSTAR) 100 UNIT/ML Solostar Pen Inject 20 Units into the skin daily at 10 pm. (Patient taking differently: Inject 30 Units into the skin daily at 10 pm. )  . Insulin Pen Needle 31G X 8 MM MISC Use as directed with Lantus.  . isosorbide mononitrate (IMDUR) 30 MG 24 hr tablet TAKE 1 TABLET BY MOUTH EVERY DAY  . lisinopril (PRINIVIL,ZESTRIL) 5 MG tablet Take 1 tablet (5 mg total) by mouth daily. Overdue for follow up. Call and schedule for future refills  . MAGNESIUM PO Take 1 tablet by mouth daily as needed (for leg cramping).   . nitroGLYCERIN (NITROSTAT) 0.4 MG SL tablet Place 1 tablet (0.4 mg total) under the tongue every 5 (five) minutes as needed for chest pain.  . rosuvastatin (CRESTOR) 10 MG tablet TAKE 1 TABLET BY MOUTH EVERY DAY  . tamsulosin (FLOMAX)  0.4 MG CAPS capsule Take 1 capsule (0.4 mg total) by mouth as needed. For difficulty urinating.  . traMADol (ULTRAM) 50 MG tablet Take 1 tablet (50 mg total) by mouth every 12 (twelve) hours as needed for severe pain.   No facility-administered encounter medications on file as of 08/24/2016.     Activities of Daily Living In your present state of health, do you have any difficulty performing the following activities: 08/24/2016  Hearing? N  Vision? N  Difficulty concentrating or making decisions? N  Walking or climbing stairs? N  Dressing or bathing? N  Doing errands, shopping? N  Preparing Food and eating ? N  Using the Toilet? N  In the past six months, have you accidently leaked urine? N  Do you have problems with  loss of bowel control? N  Managing your Medications? N  Managing your Finances? N  Housekeeping or managing your Housekeeping? N  Some recent data might be hidden    Patient Care Team: [name], NP as PCP - General (Internal Medicine)   Assessment:     Hearing Screening   125Hz  250Hz  500Hz  1000Hz  2000Hz  3000Hz  4000Hz  6000Hz  8000Hz   Right ear:   40 40 40  0    Left ear:   40 0 40  0    Vision Screening Comments: Last vision exam in Feb 2018   Exercise Activities and Dietary recommendations Current Exercise Habits: Home exercise routine, Type of exercise: Other - see comments (golf, yard work), Time (Minutes): > 60, Frequency (Times/Week): 3, Weekly Exercise (Minutes/Week): 0, Intensity: Moderate, Exercise limited by: None identified  Goals    . Increase physical activity          Starting 08/24/16, I will continue to play golf for at least 4 hours 3 days per week and do yard work as needed.       Fall Risk Fall Risk  08/24/2016 03/10/2015 12/11/2014 02/18/2014  Falls in the past year? No No No No   Depression Screen PHQ 2/9 Scores 08/24/2016 03/10/2015 12/11/2014 11/03/2014  PHQ - 2 Score 2 0 0 0  PHQ- 9 Score 2 - - -    Cognitive Function MMSE - Mini  Mental State Exam 08/24/2016  Orientation to time 5  Orientation to Place 5  Registration 3  Attention/ Calculation 0  Recall 2  Recall-comments pt was unable to recall 1 of 3 words  Language- name 2 objects 0  Language- repeat 1  Language- follow 3 step command 3  Language- read & follow direction 0  Write a sentence 0  Copy design 0  Total score 19       PLEASE NOTE: A Mini-Cog screen was completed. Maximum score is 20. A value of 0 denotes this part of Folstein MMSE was not completed or the patient failed this part of the Mini-Cog screening.   Mini-Cog Screening Orientation to Time - Max 5 pts Orientation to Place - Max 5 pts Registration - Max 3 pts Recall - Max 3 pts Language Repeat - Max 1 pts Language Follow 3 Step Command - Max 3 pts   Immunization History  Administered Date(s) Administered  . Influenza Split 11/02/2013  . Influenza,inj,Quad PF,6+ Mos 11/08/2015  . Influenza-Unspecified 11/02/2012  . Pneumococcal Conjugate-13 12/11/2014  . Pneumococcal Polysaccharide-23 05/25/2005, 08/24/2016  . Tdap 01/16/2011  . Zoster 03/02/2013   Screening Tests Health Maintenance  Topic Date Due  . INFLUENZA VACCINE  04/01/2017 (Originally 08/02/2016)  . FOOT EXAM  09/23/2016  . HEMOGLOBIN A1C  11/04/2016  . OPHTHALMOLOGY EXAM  02/17/2017  . COLONOSCOPY  04/29/2018  . TETANUS/TDAP  01/15/2021  . Hepatitis C Screening  Completed  . PNA vac Low Risk Adult  Completed      Plan:     I have personally reviewed and addressed the Medicare Annual Wellness questionnaire and have noted the following in the patient's chart:  A. Medical and social history B. Use of alcohol, tobacco or illicit drugs  C. Current medications and supplements D. Functional ability and status E.  Nutritional status F.  Physical activity G. Advance directives H. List of other physicians I.  Hospitalizations, surgeries, and ER visits in previous 12 months J.  [place] to include  hearing, vision, cognitive, depression L. Referrals and appointments -  none  In addition, I have reviewed and discussed with patient certain preventive protocols, quality metrics, and best practice recommendations. A written personalized care plan for preventive services as well as general preventive health recommendations were provided to patient.  See attached scanned questionnaire for additional information.   Signed,   Lindell Noe, MHA, BS, LPN Health Coach

## 2016-08-24 NOTE — Progress Notes (Signed)
I reviewed health advisor's note, was available for consultation, and agree with documentation and plan.  

## 2016-08-31 ENCOUNTER — Ambulatory Visit (INDEPENDENT_AMBULATORY_CARE_PROVIDER_SITE_OTHER): Payer: Medicare Other | Admitting: Primary Care

## 2016-08-31 ENCOUNTER — Telehealth: Payer: Self-pay

## 2016-08-31 DIAGNOSIS — K7581 Nonalcoholic steatohepatitis (NASH): Secondary | ICD-10-CM

## 2016-08-31 DIAGNOSIS — Z794 Long term (current) use of insulin: Secondary | ICD-10-CM

## 2016-08-31 DIAGNOSIS — E119 Type 2 diabetes mellitus without complications: Secondary | ICD-10-CM | POA: Diagnosis not present

## 2016-08-31 DIAGNOSIS — I214 Non-ST elevation (NSTEMI) myocardial infarction: Secondary | ICD-10-CM

## 2016-08-31 DIAGNOSIS — I2510 Atherosclerotic heart disease of native coronary artery without angina pectoris: Secondary | ICD-10-CM | POA: Diagnosis not present

## 2016-08-31 DIAGNOSIS — K589 Irritable bowel syndrome without diarrhea: Secondary | ICD-10-CM | POA: Diagnosis not present

## 2016-08-31 DIAGNOSIS — N400 Enlarged prostate without lower urinary tract symptoms: Secondary | ICD-10-CM

## 2016-08-31 DIAGNOSIS — E785 Hyperlipidemia, unspecified: Secondary | ICD-10-CM

## 2016-08-31 DIAGNOSIS — I10 Essential (primary) hypertension: Secondary | ICD-10-CM

## 2016-08-31 MED ORDER — INSULIN GLARGINE 100 UNIT/ML SOLOSTAR PEN: 36.0000 [IU] | PEN_INJECTOR | Freq: Every day | SUBCUTANEOUS | 1 refills | Status: DC

## 2016-08-31 MED ORDER — INSULIN ASPART 100 UNIT/ML FLEXPEN: 20.0000 [IU] | PEN_INJECTOR | Freq: Three times a day (TID) | SUBCUTANEOUS | 1 refills | Status: DC

## 2016-08-31 NOTE — Patient Instructions (Addendum)
We've increased your Lantus to 36 units every evening at bedtime. We've increased your Novolog to 20 units three times daily.  Continue to monitor your blood sugars three times daily.  Please call me if you get readings less than 100 or greater than 300 on a consistent basis.  Start exercising. You should be getting 150 minutes of moderate intensity exercise weekly.  Increase consumption of vegetables. Reduce processed snacks, breads.  Ensure you are consuming 64 ounces of water daily.  Schedule a follow up visit in 6 weeks for re-evaluation. Bring your sugar logs.  It was a pleasure to see you today!  Diabetes Mellitus and Food It is important for you to manage your blood sugar (glucose) level. Your blood glucose level can be greatly affected by what you eat. Eating healthier foods in the appropriate amounts throughout the day at about the same time each day will help you control your blood glucose level. It can also help slow or prevent worsening of your diabetes mellitus. Healthy eating may even help you improve the level of your blood pressure and reach or maintain a healthy weight. General recommendations for healthful eating and cooking habits include:  Eating meals and snacks regularly. Avoid going long periods of time without eating to lose weight.  Eating a diet that consists mainly of plant-based foods, such as fruits, vegetables, nuts, legumes, and whole grains.  Using low-heat cooking methods, such as baking, instead of high-heat cooking methods, such as deep frying.  Work with your dietitian to make sure you understand how to use the Nutrition Facts information on food labels. How can food affect me? Carbohydrates Carbohydrates affect your blood glucose level more than any other type of food. Your dietitian will help you determine how many carbohydrates to eat at each meal and teach you how to count carbohydrates. Counting carbohydrates is important to keep your blood  glucose at a healthy level, especially if you are using insulin or taking certain medicines for diabetes mellitus. Alcohol Alcohol can cause sudden decreases in blood glucose (hypoglycemia), especially if you use insulin or take certain medicines for diabetes mellitus. Hypoglycemia can be a life-threatening condition. Symptoms of hypoglycemia (sleepiness, dizziness, and disorientation) are similar to symptoms of having too much alcohol. If your health care provider has given you approval to drink alcohol, do so in moderation and use the following guidelines:  Women should not have more than one drink per day, and men should not have more than two drinks per day. One drink is equal to: ? 12 oz of beer. ? 5 oz of wine. ? 1 oz of hard liquor.  Do not drink on an empty stomach.  Keep yourself hydrated. Have water, diet soda, or unsweetened iced tea.  Regular soda, juice, and other mixers might contain a lot of carbohydrates and should be counted.  What foods are not recommended? As you make food choices, it is important to remember that all foods are not the same. Some foods have fewer nutrients per serving than other foods, even though they might have the same number of calories or carbohydrates. It is difficult to get your body what it needs when you eat foods with fewer nutrients. Examples of foods that you should avoid that are high in calories and carbohydrates but low in nutrients include:  Trans fats (most processed foods list trans fats on the Nutrition Facts label).  Regular soda.  Juice.  Candy.  Sweets, such as cake, pie, doughnuts, and cookies.  Maceo Pro  foods.  What foods can I eat? Eat nutrient-rich foods, which will nourish your body and keep you healthy. The food you should eat also will depend on several factors, including:  The calories you need.  The medicines you take.  Your weight.  Your blood glucose level.  Your blood pressure level.  Your cholesterol  level.  You should eat a variety of foods, including:  Protein. ? Lean cuts of meat. ? Proteins low in saturated fats, such as fish, egg whites, and beans. Avoid processed meats.  Fruits and vegetables. ? Fruits and vegetables that may help control blood glucose levels, such as apples, mangoes, and yams.  Dairy products. ? Choose fat-free or low-fat dairy products, such as milk, yogurt, and cheese.  Grains, bread, pasta, and rice. ? Choose whole grain products, such as multigrain bread, whole oats, and brown rice. These foods may help control blood pressure.  Fats. ? Foods containing healthful fats, such as nuts, avocado, olive oil, canola oil, and fish.  Does everyone with diabetes mellitus have the same meal plan? Because every person with diabetes mellitus is different, there is not one meal plan that works for everyone. It is very important that you meet with a dietitian who will help you create a meal plan that is just right for you. This information is not intended to replace advice given to you by your health care provider. Make sure you discuss any questions you have with your health care provider. Document Released: 09/15/2004 Document Revised: 05/27/2015 Document Reviewed: 11/15/2012 Elsevier Interactive Patient Education  2017 [employer].

## 2016-08-31 NOTE — Assessment & Plan Note (Addendum)
A1C above goal, however, blood glucose levels are improving on Lantus and Novolog. He will check with his insurance company regarding better coverage as his insulin is getting expensive.  Increase Lantus to 36 units as morning and evening sugars are higher at that time. Will increase Novolog to 20 units prior to meals for sugars above 120. He will continue to monitor and report consistent readings below 100 or above 300. Follow up in 6 weeks with sugar logs for re-evaluation.

## 2016-08-31 NOTE — Assessment & Plan Note (Signed)
Overall stable on once daily dicyclomine.

## 2016-08-31 NOTE — Telephone Encounter (Signed)
Patient called back.  He picked up Relion N and he was happy because it cost him $38 with the needles.  Patient took his old insulin at 12:30 and wants to know how long should he wait until he takes the next dose?  Patient can be reached at [phone].

## 2016-08-31 NOTE — Telephone Encounter (Signed)
Spoken and notified patient of Kate's comments. Patient verbalized understanding. He will let us know if there is any concerns or questions

## 2016-08-31 NOTE — Assessment & Plan Note (Signed)
LFT's unremarkable.  

## 2016-08-31 NOTE — Assessment & Plan Note (Signed)
Stable on recent lipid panel, continue atorvastatin.

## 2016-08-31 NOTE — Telephone Encounter (Signed)
Pt left v/m; pt was seen earlier today and pt said previously you had discussed a med pt could get at [facility] that would be a lot less expensive for blood sugar. Pt request cb

## 2016-08-31 NOTE — Progress Notes (Signed)
Subjective:    Patient ID: [name], male    DOB: July 21, 1945, 71 y.o.   MRN: [id number]  HPI  [name] is a 71 year old male who presents today for [place] Part 2. He saw our health advisor last week.   1) Type 2 Diabetes:   Current medications include: Lantus 30 units HS, Novolog 18 units TID. Could not tolerate Metformin.   He is checking his blood glucose three times daily and is getting readings of: Before Breakfast: mid 200's-low 300's Before Lunch: 140-280's Before Dinner: 250-350's  Last A1C: 11.5 on 08/24/16 Last Eye Exam: Completed in February 2018 Last Foot Exam: Due next visit in 6 weeks. Pneumonia Vaccination: Last completed in 2018 ACE/ARB: Lisinopril Statin: Atorvastatin  Diet currently consists of:  Breakfast: Eggs, bacon Lunch: Protein bar, crackers Dinner: Meat, vegetable, starch, sandwiches Snacks: Protein Bar, Crackers with peanut butter, nuts Desserts: Once weekly Beverages: Diet Coke, 4-5 large glasses of water.  Exercise: Active by playing golf, not exercising  2) Essential Hypertension/CAD/Atrial Fibrillation: Currently managed on carvedilol 6.25 mg, Imdur 30 mg, lisinopril 5 mg (renal protection). Also managed on Eliquis, atorvastatin, nitroglycerin PRN. Currently following with cardiology. He denies chest pain.   3) IBS: Currently managed on dicyclomine for which he's taking once daily with significant improvement.   4) BPH: Currently managed on Flomax daily. Denies difficulty urinating, urgency, frequency.   Review of Systems  Constitutional: Negative for unexpected weight change.  HENT: Negative for rhinorrhea.   Respiratory: Negative for cough and shortness of breath.   Cardiovascular: Negative for chest pain.  Gastrointestinal: Negative for constipation and diarrhea.  Genitourinary: Negative for difficulty urinating.  Musculoskeletal: Negative for arthralgias and myalgias.  Skin: Negative for rash.  Allergic/Immunologic: Negative for  environmental allergies.  Neurological: Negative for dizziness, numbness and headaches.  Psychiatric/Behavioral: The patient is not nervous/anxious.        Past Medical History:  Diagnosis Date  . Arthritis    "fingers, shoulders" (08/03/2015)  . Atrial fibrillation (HCC)    Paroxysmal, rare episodes, sinus rhythm on flecainide, patient prefers not to take Coumadin  . Bradycardia    April, 2013  . Chest pain    Nuclear, 2006, no ischemia  . Chronic lower back pain    "all my life"  . Coronary artery disease    s/p staged cath 05/12/2014 and 5/11, DES x 2 to heavily calcified RCA, residual with 60% prox LAD, 70% D1  . Ejection fraction    EF 55-60%,  echo, January, 2011  . Elevated CPK    CPK elevated with normal MB and normal troponin the past  . Heart murmur   . History of echocardiogram    Echo 8/16: EF 55%, normal wall motion, grade 2 diastolic dysfunction, trivial MR, normal RV function, PASP 27 mmHg  . Hypertension   . IBS (irritable bowel syndrome)   . Myocardial infarction ([place]) 06/2014  . Sinus drainage   . Type II diabetes mellitus ([place])      Social History   Social History  . Marital status: Married    Spouse name: N/A  . Number of children: N/A  . Years of education: N/A   Occupational History  . Not on file.   Social History Main Topics  . Smoking status: Never Smoker  . Smokeless tobacco: Never Used  . Alcohol use No  . Drug use: No  . Sexual activity: Not on file   Other Topics Concern  . Not on  file   Social History Narrative  . No narrative on file    Past Surgical History:  Procedure Laterality Date  . APPENDECTOMY    . CARDIAC CATHETERIZATION N/A 05/12/2014   Procedure: Left Heart Cath and Coronary Angiography;  Surgeon: Troy Sine, MD;  Location: [facility];  Service: Cardiovascular;  Laterality: N/A;  . CARDIAC CATHETERIZATION N/A 05/13/2014   Procedure: Coronary/Graft Atherectomy;  Surgeon: Troy Sine, MD;  Location: [facility];  Service: Cardiovascular;  Laterality: N/A;  . CARDIAC CATHETERIZATION Right 05/13/2014   Procedure: Temporary Pacemaker;  Surgeon: Troy Sine, MD;  Location: [facility];  Service: Cardiovascular;  Laterality: Right;  . CARDIAC CATHETERIZATION N/A 05/13/2014   Procedure: Coronary Stent Intervention;  Surgeon: Troy Sine, MD;  Location: [facility];  Service: Cardiovascular;  Laterality: N/A;  . CARDIAC CATHETERIZATION N/A 06/18/2014   Procedure: Left Heart Cath and Coronary Angiography;  Surgeon: Peter M Martinique, MD;  Location: [facility];  Service: Cardiovascular;  Laterality: N/A;  . CARDIAC CATHETERIZATION N/A 08/03/2015   Procedure: Left Heart Cath and Cors/Grafts Angiography;  Surgeon: Nelva Bush, MD;  Location: [facility];  Service: Cardiovascular;  Laterality: N/A;  . CARDIOVASCULAR STRESS TEST  05/06/2014  . CATARACT EXTRACTION W/ INTRAOCULAR LENS IMPLANT Left   . CORONARY ARTERY BYPASS GRAFT N/A 06/22/2014   Procedure: CORONARY ARTERY BYPASS GRAFTING (CABG) x five, using left internal mammary artery, and right leg greater saphenous vein harvested endoscopically;  Surgeon: Melrose Nakayama, MD;  Location: [facility];  Service: Open Heart Surgery;  Laterality: N/A;  . LAPAROSCOPIC CHOLECYSTECTOMY    . MAZE N/A 06/22/2014   Procedure: MAZE;  Surgeon: Melrose Nakayama, MD;  Location: [facility];  Service: Open Heart Surgery;  Laterality: N/A;  . TEE WITHOUT CARDIOVERSION N/A 06/22/2014   Procedure: TRANSESOPHAGEAL ECHOCARDIOGRAM (TEE);  Surgeon: Melrose Nakayama, MD;  Location: [facility];  Service: Open Heart Surgery;  Laterality: N/A;    Family History  Problem Relation Age of Onset  . Alzheimer's disease Mother   . Emphysema Father   . Cancer Brother   . Arrhythmia Sister   . Heart attack Sister   . Heart disease Sister   . Hyperlipidemia Sister   . Hypertension Sister     Allergies  Allergen Reactions  . Lipitor [Atorvastatin]  Other (See Comments)    Myopathy Spring 2006  . Pravastatin Other (See Comments)    LEG CRAMPS  . Simvastatin Other (See Comments)    LEG CRAMPS    Current Outpatient Prescriptions on File Prior to Visit  Medication Sig Dispense Refill  . aspirin 81 MG tablet Take 81 mg by mouth daily.    . carvedilol (COREG) 6.25 MG tablet TAKE 1 TABLET BY MOUTH TWICE A DAY WITH MEALS 60 tablet 1  . dicyclomine (BENTYL) 20 MG tablet Take 1 tablet (20 mg total) by mouth 4 (four) times daily -  before meals and at bedtime. (Patient taking differently: Take 20 mg by mouth 2 (two) times daily. ) 120 tablet 2  . ELIQUIS 5 MG TABS tablet TAKE 1 TABLET BY MOUTH TWICE A DAY 60 tablet 5  . fluorometholone (FML) 0.1 % ophthalmic suspension Place 1 drop into the right eye daily as needed (for eye redness and itching).     . fluticasone (FLONASE) 50 MCG/ACT nasal spray Place 1 spray into both nostrils as needed for allergies or rhinitis. 16 g 11  .  ibuprofen (ADVIL,MOTRIN) 200 MG tablet Take 200 mg by mouth daily as needed for headache or moderate pain.     . Insulin Pen Needle 31G X 8 MM MISC Use as directed with Lantus. 100 each 5  . isosorbide mononitrate (IMDUR) 30 MG 24 hr tablet TAKE 1 TABLET BY MOUTH EVERY DAY 90 tablet 0  . lisinopril (PRINIVIL,ZESTRIL) 5 MG tablet Take 1 tablet (5 mg total) by mouth daily. Overdue for follow up. Call and schedule for future refills 30 tablet 1  . MAGNESIUM PO Take 1 tablet by mouth daily as needed (for leg cramping).     . nitroGLYCERIN (NITROSTAT) 0.4 MG SL tablet Place 1 tablet (0.4 mg total) under the tongue every 5 (five) minutes as needed for chest pain. 25 tablet 3  . rosuvastatin (CRESTOR) 10 MG tablet TAKE 1 TABLET BY MOUTH EVERY DAY 30 tablet 1  . tamsulosin (FLOMAX) 0.4 MG CAPS capsule Take 1 capsule (0.4 mg total) by mouth as needed. For difficulty urinating. 90 capsule 1  . traMADol (ULTRAM) 50 MG tablet Take 1 tablet (50 mg total) by mouth every 12 (twelve) hours  as needed for severe pain. (Patient not taking: Reported on 08/31/2016) 14 tablet 0   No current facility-administered medications on file prior to visit.     BP 120/70   Pulse 66   Temp 98.3 F (36.8 C) (Oral)   Ht 5' 7"  (1.702 m)   Wt 193 lb (87.5 kg)   SpO2 98%   BMI 30.23 kg/m    Objective:   Physical Exam  Constitutional: He is oriented to person, place, and time. He appears well-nourished.  HENT:  Right Ear: Tympanic membrane and ear canal normal.  Left Ear: Tympanic membrane and ear canal normal.  Nose: Nose normal. Right sinus exhibits no maxillary sinus tenderness and no frontal sinus tenderness. Left sinus exhibits no maxillary sinus tenderness and no frontal sinus tenderness.  Mouth/Throat: Oropharynx is clear and moist.  Eyes: Pupils are equal, round, and reactive to light. Conjunctivae and EOM are normal.  Neck: Neck supple. Carotid bruit is not present. No thyromegaly present.  Cardiovascular: Normal rate, regular rhythm and normal heart sounds.   Pulmonary/Chest: Effort normal and breath sounds normal. He has no wheezes. He has no rales.  Abdominal: Soft. Bowel sounds are normal. There is no tenderness.  Musculoskeletal: Normal range of motion.  Neurological: He is alert and oriented to person, place, and time. He has normal reflexes. No cranial nerve deficit.  Skin: Skin is warm and dry.  Psychiatric: He has a normal mood and affect.          Assessment & Plan:

## 2016-08-31 NOTE — Assessment & Plan Note (Signed)
Stable in the office today, continue current regimen. 

## 2016-08-31 NOTE — Assessment & Plan Note (Addendum)
Doing well on Flomax, continue same. PSA up to date, unremarkable.

## 2016-08-31 NOTE — Telephone Encounter (Signed)
Spoken and notified patient of Kate's comments. Patient verbalized understanding. 

## 2016-08-31 NOTE — Telephone Encounter (Signed)
Yes, please have him check on the cost of the [employer] of "N" (intermiediate) insulin and "R" (fast acting) insulin at [employer]. Is he wanting to switch from Lantus (long acting) and Novolog (short acting)?

## 2016-08-31 NOTE — Telephone Encounter (Signed)
Please notify patient to continue his old insulin right now, both the Lantus and Novolog. I'll be in touch with him in regards to the new insulin tomorrow.

## 2016-08-31 NOTE — Assessment & Plan Note (Signed)
Managed on aspirin, beta blocker, statin. Continue working on blood sugar control.

## 2016-08-31 NOTE — Assessment & Plan Note (Signed)
Continue statin, beta blocker, aspirin, BP control, blood glucose control.

## 2016-09-13 ENCOUNTER — Other Ambulatory Visit: Payer: Self-pay | Admitting: Interventional Cardiology

## 2016-10-03 ENCOUNTER — Other Ambulatory Visit: Payer: Self-pay | Admitting: Interventional Cardiology

## 2016-10-04 NOTE — Telephone Encounter (Signed)
Please call office and schedule appointment for further refills. 

## 2016-10-15 ENCOUNTER — Other Ambulatory Visit: Payer: Self-pay | Admitting: Interventional Cardiology

## 2016-10-17 ENCOUNTER — Other Ambulatory Visit: Payer: Self-pay | Admitting: Interventional Cardiology

## 2016-10-24 ENCOUNTER — Ambulatory Visit: Payer: Medicare Other

## 2016-10-24 ENCOUNTER — Ambulatory Visit (INDEPENDENT_AMBULATORY_CARE_PROVIDER_SITE_OTHER): Payer: Medicare Other

## 2016-10-24 DIAGNOSIS — Z23 Encounter for immunization: Secondary | ICD-10-CM

## 2016-11-09 NOTE — Progress Notes (Signed)
Cardiology Office Note   Date:  11/10/2016   ID:  ALP GOLDWATER, DOB Sep 17, 1945, MRN [id number]  PCP:  [name], NP    No chief complaint on file. CAD   Wt Readings from Last 3 Encounters:  11/10/16 203 lb 6.4 oz (92.3 kg)  08/31/16 193 lb (87.5 kg)  08/24/16 193 lb 8 oz (87.8 kg)       History of Present Illness: [name] is a 71 y.o. male   who presents today to follow-up coronary disease.  He had stents in 5/16 with rotational atherectomy.  He then had early restenosis and progression of left sided disease. He had surgery with Dr. Roxan Hockey.  He is post-CABG; after having surgery in June 2016.  He has some toe numbness intermittently. His surgery was June 22, 2014. In addition he had a maze procedure and a left atrial appendage clip was placed. He remains on anticoagulation. During his initial hospitalization his EF was in the 40-45% range.  EF had normalized at 55%. He feels great.  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.  He had palpitations with his prior atrial fibrillation. He did have left atrial appendage clipping during his open heart surgery.   He had a NSTEMI in Augist 2017.  EF by cath was low.  EF by echo was 50-55%. SVG to RCA occluded but medically managed.   Denies : Chest pain. Dizziness. Leg edema. Nitroglycerin use. Orthopnea. Palpitations. Paroxysmal nocturnal dyspnea. Shortness of breath. Syncope.   No bleeding problems.  Has gained weight.  Avoiding sugar.  He uses a protein drink in the morning.        Past Medical History:  Diagnosis Date  . Arthritis    "fingers, shoulders" (08/03/2015)  . Atrial fibrillation (HCC)    Paroxysmal, rare episodes, sinus rhythm on flecainide, patient prefers not to take Coumadin  . Bradycardia    April, 2013  . Chest pain    Nuclear, 2006, no ischemia  . Chronic lower back pain    "all my life"  . Coronary artery disease    s/p staged cath 05/12/2014 and 5/11, DES x 2 to heavily  calcified RCA, residual with 60% prox LAD, 70% D1  . Ejection fraction    EF 55-60%,  echo, January, 2011  . Elevated CPK    CPK elevated with normal MB and normal troponin the past  . Heart murmur   . History of echocardiogram    Echo 8/16: EF 55%, normal wall motion, grade 2 diastolic dysfunction, trivial MR, normal RV function, PASP 27 mmHg  . Hypertension   . IBS (irritable bowel syndrome)   . Myocardial infarction ([place]) 06/2014  . Sinus drainage   . Type II diabetes mellitus ([place])     Past Surgical History:  Procedure Laterality Date  . APPENDECTOMY    . CARDIOVASCULAR STRESS TEST  05/06/2014  . CATARACT EXTRACTION W/ INTRAOCULAR LENS IMPLANT Left   . LAPAROSCOPIC CHOLECYSTECTOMY       Current Outpatient Medications  Medication Sig Dispense Refill  . aspirin 81 MG tablet Take 81 mg by mouth daily.    . carvedilol (COREG) 6.25 MG tablet TAKE 1 TABLET BY MOUTH TWICE A DAY WITH FOOD 30 tablet 0  . dicyclomine (BENTYL) 20 MG tablet Take 20 mg 2 (two) times daily by mouth.    Arne Cleveland 5 MG TABS tablet TAKE 1 TABLET BY MOUTH TWICE A DAY 60 tablet 5  .  fluorometholone (FML) 0.1 % ophthalmic suspension Place 1 drop into the right eye daily as needed (for eye redness and itching).     . fluticasone (FLONASE) 50 MCG/ACT nasal spray Place 1 spray into both nostrils as needed for allergies or rhinitis. 16 g 11  . ibuprofen (ADVIL,MOTRIN) 200 MG tablet Take 200 mg by mouth daily as needed for headache or moderate pain.     Marland Kitchen insulin aspart (NOVOLOG FLEXPEN) 100 UNIT/ML FlexPen Inject 20 Units into the skin 3 (three) times daily with meals. 30 mL 1  . Insulin Glargine (LANTUS SOLOSTAR) 100 UNIT/ML Solostar Pen Inject 36 Units into the skin daily at 10 pm. 5 pen 1  . Insulin Pen Needle 31G X 8 MM MISC Use as directed with Lantus. 100 each 5  . isosorbide mononitrate (IMDUR) 30 MG 24 hr tablet TAKE 1 TABLET BY MOUTH EVERY DAY 90 tablet 0  . lisinopril (PRINIVIL,ZESTRIL) 5 MG tablet Take 1  tablet (5 mg total) by mouth daily. Patient must call and schedule an appointment for further refills 3rd/final attempt 15 tablet 0  . MAGNESIUM PO Take 1 tablet by mouth daily as needed (for leg cramping).     . nitroGLYCERIN (NITROSTAT) 0.4 MG SL tablet Place 1 tablet (0.4 mg total) under the tongue every 5 (five) minutes as needed for chest pain. 25 tablet 3  . rosuvastatin (CRESTOR) 10 MG tablet TAKE 1 TABLET BY MOUTH EVERY DAY 15 tablet 0  . tamsulosin (FLOMAX) 0.4 MG CAPS capsule Take 1 capsule (0.4 mg total) by mouth as needed. For difficulty urinating. 90 capsule 1  . traMADol (ULTRAM) 50 MG tablet Take 1 tablet (50 mg total) by mouth every 12 (twelve) hours as needed for severe pain. 14 tablet 0   No current facility-administered medications for this visit.     Allergies:   Lipitor [atorvastatin]; Pravastatin; and Simvastatin    Social History:  The patient  reports that  has never smoked. he has never used smokeless tobacco. He reports that he does not drink alcohol or use drugs.   Family History:  The patient's family history includes Alzheimer's disease in his mother; Arrhythmia in his sister; Cancer in his brother; Emphysema in his father; Heart attack in his sister; Heart disease in his sister; Hyperlipidemia in his sister; Hypertension in his sister.    ROS:  Please see the history of present illness.   Otherwise, review of systems are positive for tingling in toes since CABG.   All other systems are reviewed and negative.    PHYSICAL EXAM: VS:  BP 110/66   Pulse 68   Ht 5' 7"  (1.702 m)   Wt 203 lb 6.4 oz (92.3 kg)   SpO2 97%   BMI 31.86 kg/m  , BMI Body mass index is 31.86 kg/m. GEN: Well nourished, well developed, in no acute distress  HEENT: normal  Neck: no JVD, carotid bruits, or masses Cardiac: RRR; no murmurs, rubs, or gallops,no edema ; 2+ PT pulses Respiratory:  clear to auscultation bilaterally, normal work of breathing GI: soft, nontender, nondistended, +  BS MS: no deformity or atrophy  Skin: warm and dry, no rash Neuro:  Strength and sensation are intact Psych: euthymic mood, full affect   EKG:   The ekg ordered today demonstrates NSR, nonspecific ST segment changes   Recent Labs: 08/24/2016: ALT 27; BUN 17; Creatinine, Ser 1.05; Potassium 4.8; Sodium 133   Lipid Panel    Component Value Date/Time   CHOL 104  08/24/2016 0827   TRIG 72.0 08/24/2016 0827   HDL 40.00 08/24/2016 0827   CHOLHDL 3 08/24/2016 0827   VLDL 14.4 08/24/2016 0827   LDLCALC 50 08/24/2016 0827     Other studies Reviewed: Additional studies/ records that were reviewed today with results demonstrating: cath results from 2017 revewed, lipids rviewed.   ASSESSMENT AND PLAN:  1. AFib: Eliquis for stroke prevention, recommended by Dr, Rayann Heman.  No bleeding issues.  Can consider stopping aspirin in the future if he avoids further ACS.  2. CAD: Occluded SVG to RCA.  Occluded native RCA.  No angina on medical therapy.  Continue current medications.  He does not feel that he is having any side effects. 3. Hyperlipidemia: Lipids well controlled in 8/18. Continue Crestor.  Labs reviewed. 4. Diabetes:  A1C high in 08/24/16. Continue medical therapy.  We spoke about dietary changes to help manage his sugar.   Current medicines are reviewed at length with the patient today.  The patient concerns regarding his medicines were addressed.  The following changes have been made:  No change  Labs/ tests ordered today include:  No orders of the defined types were placed in this encounter.   Recommend 150 minutes/week of aerobic exercise Low fat, low carb, high fiber diet recommended  Disposition:   FU in 1 year   Signed, Larae Grooms, MD  11/10/2016 8:31 AM    [facility] [address], [place], [place]  [address] Phone: [phone]; Fax: [phone]

## 2016-11-10 ENCOUNTER — Other Ambulatory Visit: Payer: Self-pay | Admitting: Interventional Cardiology

## 2016-11-10 ENCOUNTER — Ambulatory Visit (INDEPENDENT_AMBULATORY_CARE_PROVIDER_SITE_OTHER): Payer: Medicare Other | Admitting: Interventional Cardiology

## 2016-11-10 ENCOUNTER — Encounter: Payer: Self-pay | Admitting: Interventional Cardiology

## 2016-11-10 VITALS — BP 110/66 | HR 68 | Ht 67.0 in | Wt 203.4 lb

## 2016-11-10 DIAGNOSIS — E1159 Type 2 diabetes mellitus with other circulatory complications: Secondary | ICD-10-CM

## 2016-11-10 DIAGNOSIS — I2510 Atherosclerotic heart disease of native coronary artery without angina pectoris: Secondary | ICD-10-CM | POA: Diagnosis not present

## 2016-11-10 DIAGNOSIS — I480 Paroxysmal atrial fibrillation: Secondary | ICD-10-CM | POA: Diagnosis not present

## 2016-11-10 DIAGNOSIS — E782 Mixed hyperlipidemia: Secondary | ICD-10-CM | POA: Diagnosis not present

## 2016-11-10 NOTE — Patient Instructions (Signed)

## 2016-11-13 ENCOUNTER — Other Ambulatory Visit: Payer: Self-pay | Admitting: Interventional Cardiology

## 2016-11-16 ENCOUNTER — Other Ambulatory Visit: Payer: Self-pay | Admitting: Primary Care

## 2016-11-25 ENCOUNTER — Ambulatory Visit (INDEPENDENT_AMBULATORY_CARE_PROVIDER_SITE_OTHER): Payer: Medicare Other | Admitting: Family Medicine

## 2016-11-25 ENCOUNTER — Encounter: Payer: Self-pay | Admitting: Family Medicine

## 2016-11-25 ENCOUNTER — Other Ambulatory Visit: Payer: Self-pay | Admitting: Interventional Cardiology

## 2016-11-25 VITALS — BP 120/82 | HR 71 | Temp 98.4°F | Resp 14 | Ht 67.0 in | Wt 205.0 lb

## 2016-11-25 DIAGNOSIS — R6889 Other general symptoms and signs: Secondary | ICD-10-CM

## 2016-11-25 MED ORDER — OSELTAMIVIR PHOSPHATE 75 MG PO CAPS: 75.0000 mg | ORAL_CAPSULE | Freq: Two times a day (BID) | ORAL | 0 refills | Status: DC

## 2016-11-25 NOTE — Progress Notes (Signed)
Chief Complaint  Patient presents with  . Sore Throat    cough, body aches, chills, chest heaviness, mucus production-creamy color x Thursday     [name] here for URI complaints.  Duration: 2 days  Associated symptoms: Nasal congestion, rhinorrhea, malaise, sore throat, myalgia and chills Denies: sinus pain, ear pain/drainage, itchy/watery eyes, SOB, wheezing, rigors Treatment to date: Mucinex, Nyquil  Sick contacts: No   ROS:  Const: +chills HEENT: As noted in HPI Lungs: No SOB  Past Medical History:  Diagnosis Date  . Arthritis    "fingers, shoulders" (08/03/2015)  . Atrial fibrillation (HCC)    Paroxysmal, rare episodes, sinus rhythm on flecainide, patient prefers not to take Coumadin  . Bradycardia    April, 2013  . Chest pain    Nuclear, 2006, no ischemia  . Chronic lower back pain    "all my life"  . Coronary artery disease    s/p staged cath 05/12/2014 and 5/11, DES x 2 to heavily calcified RCA, residual with 60% prox LAD, 70% D1  . Ejection fraction    EF 55-60%,  echo, January, 2011  . Elevated CPK    CPK elevated with normal MB and normal troponin the past  . Heart murmur   . History of echocardiogram    Echo 8/16: EF 55%, normal wall motion, grade 2 diastolic dysfunction, trivial MR, normal RV function, PASP 27 mmHg  . Hypertension   . IBS (irritable bowel syndrome)   . Myocardial infarction ([place]) 06/2014  . Sinus drainage   . Type II diabetes mellitus (HCC)    Family History  Problem Relation Age of Onset  . Alzheimer's disease Mother   . Emphysema Father   . Cancer Brother   . Arrhythmia Sister   . Heart attack Sister   . Heart disease Sister   . Hyperlipidemia Sister   . Hypertension Sister     BP 120/82 (BP Location: Left Arm, Patient Position: Sitting, Cuff Size: Large)   Pulse 71   Temp 98.4 F (36.9 C) (Oral)   Resp 14   Ht 5' 7"  (1.702 m)   Wt 205 lb (93 kg)   SpO2 99%   BMI 32.11 kg/m  General: Awake, alert, appears stated  age HEENT: AT, [place], ears patent b/l and TM's neg, nares patent w/o discharge, pharynx pink and without exudates, MMM Neck: No masses or asymmetry Heart: RRR, no murmurs, no bruits Lungs: CTAB, no accessory muscle use Psych: Age appropriate judgment and insight, normal mood and affect  Flu-like symptoms  Tamiflu. Ibuprofen, Tylenol, push fluids, practice good hand hygiene, cover mouth when coughing. F/u prn. If starting to experience fevers, shaking, or shortness of breath, seek immediate care. Pt voiced understanding and agreement to the plan.  [place], DO 11/25/16 11:55 AM

## 2016-11-25 NOTE — Patient Instructions (Signed)
Continue to push fluids, practice good hand hygiene, and cover your mouth if you cough.  If you start having worsening symptoms, seek care.  OK to take Tylenol 1000 mg (2 extra strength tabs) or 975 mg (3 regular strength tabs) every 6 hours as needed.  Ibuprofen 400-600 mg (2-3 over the counter strength tabs) every 6 hours as needed for pain.

## 2016-11-25 NOTE — Progress Notes (Signed)
Pre visit review using our clinic review tool, if applicable. No additional management support is needed unless otherwise documented below in the visit note. 

## 2016-11-27 ENCOUNTER — Ambulatory Visit: Payer: Self-pay | Admitting: *Deleted

## 2016-11-27 ENCOUNTER — Telehealth: Payer: Self-pay | Admitting: Primary Care

## 2016-11-27 ENCOUNTER — Telehealth: Payer: Self-pay

## 2016-11-27 NOTE — Telephone Encounter (Signed)
Pt was seen at [facility].

## 2016-11-27 NOTE — Telephone Encounter (Signed)
PLEASE NOTE: All timestamps contained within this report are represented as [place] Standard Time. CONFIDENTIALTY NOTICE: This fax transmission is intended only for the addressee. It contains information that is legally privileged, confidential or otherwise protected from use or disclosure. If you are not the intended recipient, you are strictly prohibited from reviewing, disclosing, copying using or disseminating any of this information or taking any action in reliance on or regarding this information. If you have received this fax in error, please notify us immediately by telephone so that we can arrange for its return to [place]. Phone: [phone], Toll-Free: [phone], Fax: [phone] Page: 1 of 2 Call Id: [id number] [place] Patient Name: [name] Gender: Male DOB: 1945-09-22 Age: 71 Y 7 M 15 D Return Phone Number: [phone] (Primary), [phone] (Secondary), [phone] (Alternate) Address: City/State/Zip: [place] [address] Client [facility] Night - Client Client Site [place] Physician Alma Friendly - NP Contact Type Call Who Is Calling Patient / Member / Family / Caregiver Call Type Triage / Clinical Caller Name Asaiah Scarber Relationship To Patient Spouse Return Phone Number [phone] (Primary) Chief Complaint Cough Reason for Call Symptomatic / Request for [place] states her husband has a bad cough and chills. Thinks he might needs ABX, because he has been sick since Thursday. Translation No Nurse Assessment Nurse: Markus Daft, RN, Sherre Poot Date/Time (Eastern Time): 11/25/2016 9:54:48 AM Confirm and document reason for call. If symptomatic, describe symptoms. ---Caller states her husband has a bad cough and chills. Thinks he might needs antibiotics because he has been sick  since Thursday. - She is not with him right now. - RN offered triage, and explained that antibiotics are not called in after hrs w/o being seen. - Caller / Patient verbalized understanding. Does the patient have any new or worsening symptoms? ---Yes Will a triage be completed? ---Yes Related visit to physician within the last 2 weeks? ---No Does the PT have any chronic conditions? (i.e. diabetes, asthma, etc.) ---Yes List chronic conditions. ---MI 2 yrs ago, CAD, CABG x 5 vessels then Is this a behavioral health or substance abuse call? ---No Guidelines Guideline Title Affirmed Question Affirmed Notes Nurse Date/Time (Eastern Time) Cough - Acute Productive [1] Continuous (nonstop) coughing interferes with work or school AND [2] no improvement using cough treatment per [place], RN, Sherre Poot 11/25/2016 10:04:24 AM Disp. Time Eilene Ghazi Time) Disposition Final User PLEASE NOTE: All timestamps contained within this report are represented as [place] Standard Time. CONFIDENTIALTY NOTICE: This fax transmission is intended only for the addressee. It contains information that is legally privileged, confidential or otherwise protected from use or disclosure. If you are not the intended recipient, you are strictly prohibited from reviewing, disclosing, copying using or disseminating any of this information or taking any action in reliance on or regarding this information. If you have received this fax in error, please notify us immediately by telephone so that we can arrange for its return to [place]. Phone: [phone], Toll-Free: [phone], Fax: [phone] Page: 2 of 2 Call Id: [id number] 11/25/2016 10:06:23 AM See Physician within 24 Hours Yes Markus Daft, RN, Sherre Poot Caller Disagree/Comply Comply Caller Understands Yes PreDisposition Call Doctor Care Advice Given Per Guideline SEE PHYSICIAN WITHIN 24 HOURS: * IF OFFICE WILL BE OPEN: You need to be seen within the next 24 hours. Call  your doctor when the office opens, and make an appointment.  COUGH MEDICINES: * OTC COUGH SYRUPS: The most common cough suppressant in OTC cough medications is dextromethorphan. Often the letters 'DM' appear in the name. OTC COUGH SYRUP - DEXTROMETHORPHAN: * Cough syrups containing the cough suppressant dextromethorphan (DM) may help decrease your cough. Cough syrups work best for coughs that keep you awake at night. They can also sometimes help in the late stages of a respiratory infection when the cough is dry and hacking. They can be used along with cough drops. * Examples: Benylin, Robitussin DM, Vicks 44 Cough Relief * Read the package instructions for dosage, contraindications, and other important information. CAUTION - DEXTROMETHORPHAN: * Do not try to completely suppress coughs that produce mucus and phlegm. Remember that coughing is helpful in bringing up mucus from the lungs and preventing pneumonia. HUMIDIFIER: If the air is dry, use a humidifier in the bedroom. (Reason: dry air makes coughs worse) COUGHING SPELLS: * Drink warm fluids. Inhale warm mist. (Reason: both relax the airway and loosen up the phlegm) CALL BACK IF: * Difficulty breathing occurs * You become worse. CARE ADVICE given per Cough - Acute Productive (Adult) guideline. Comments User: Mayford Knife, RN Date/Time Eilene Ghazi Time): 11/25/2016 9:57:31 AM Caller not with patient. RN will call him in 1-2 min. after caller lets him know that I'll be calling. User: Mayford Knife, RN Date/Time Eilene Ghazi Time): 11/25/2016 10:02:49 AM Patient reached and cont. process. User: Mayford Knife, RN Date/Time Eilene Ghazi Time): 11/25/2016 10:07:09 AM Pt transfered to [facility] office for appt. Referrals [facility]

## 2016-11-27 NOTE — Telephone Encounter (Unsigned)
Copied from [place]. Topic: Quick Communication - See Telephone Encounter >> Nov 27, 2016  9:58 AM Hewitt Shorts wrote: CRM for notification. See Telephone encounter for: pt  saw Dr. At [facility] last week and is still not better he states he was diagnosed with the flu but still has sore throat and cough symptoms ands he needs to have something other than the generic tamiflu best number [phone]  11/27/16.

## 2016-11-27 NOTE — Telephone Encounter (Signed)
See  Triage   Telephone  Encounter

## 2016-11-27 NOTE — Telephone Encounter (Signed)
Pt   Reports  He  Is  acuaully  Feeling better  He  Was   advised  To  Continue  meds  And  Take  The  Delsym  And  To  Drink   Lots  Of  meds  He  Was  Advised  To   Call  If  Worse   Reason for Disposition . Cough  Answer Assessment - Initial Assessment Questions 1. ONSET: "When did the cough begin?"       4  Days  ago 2. SEVERITY: "How bad is the cough today?"       OFF   AND ON   -  SLIGHTLY  BETTER   3. RESPIRATORY DISTRESS: "Describe your breathing."        Breathing  Ok    4. FEVER: "Do you have a fever?" If so, ask: "What is your temperature, how was it measured, and when did it start?"      No 5. SPUTUM: "Describe the color of your sputum" (clear, white, yellow, green)       Mucous  Yellow -  tannish    6. HEMOPTYSIS: "Are you coughing up any blood?" If so ask: "How much?" (flecks, streaks, tablespoons, etc.)     No 7. CARDIAC HISTORY: "Do you have any history of heart disease?" (e.g., heart attack, congestive heart failure)       Heart    Attack     2  Years   Ago   8. LUNG HISTORY: "Do you have any history of lung disease?"  (e.g., pulmonary embolus, asthma, emphysema)      No 9. PE RISK FACTORS: "Do you have a history of blood clots?" (or: recent major surgery, recent prolonged travel, bedridden )     NO  10. OTHER SYMPTOMS: "Do you have any other symptoms?" (e.g., runny nose, wheezing, chest pain)      Pt  Reports   Runny  Headache   Runny  Nose   Had   sorethroat  Earlier    Better   11. PREGNANCY: "Is there any chance you are pregnant?" "When was your last menstrual period?"       No  12. TRAVEL: "Have you traveled out of the country in the last month?" (e.g., travel history, exposures)       no  Protocols used: [place]

## 2016-11-29 ENCOUNTER — Telehealth: Payer: Self-pay | Admitting: Primary Care

## 2016-11-29 NOTE — Telephone Encounter (Signed)
Patient reports he is having scratchy throat and coughing yellow mucus. Patient took last dose of Tamiflu today. Patient reports the medication did not help his symptoms at all.  Patient is using OTC Tylenol PM for rest at night. Patient coughs with sinus drainage. Patient reports he has a headache with the sinus pressure- no face or teeth pain. Patient has no fever. Patient does not feel he has swelling in his throat- thinks discomfort from drainage. No chest congestion reported- only upper respiratory-sinus reported. Patient has noticed looser stools. Patient is leaving on a trip in the morning and wants to know if he needs another medication or wants advisement from provider for treatment of his symptoms.  Advised patient of self care in public- frequent hand washing, vitamins to build immune system, treating symptoms and the action of Tamiflu.

## 2016-11-29 NOTE — Telephone Encounter (Signed)
Copied from [place]. Topic: Quick Communication - See Telephone Encounter >> Nov 27, 2016  9:58 AM Hewitt Shorts wrote: CRM for notification. See Telephone encounter for: pt  saw Dr. At [facility] last week and is still not better he states he was diagnosed with the flu but still has sore throat and cough symptoms ands he needs to have something other than the generic tamiflu but nothing is helping  best number [phone]  11/27/16. >> Nov 29, 2016  8:28 AM Sandi Mariscal E, NT wrote: Pt. Called back and said he is not feeling any better. Pt has the flu and is going out of town and wanted to see if someone could call him something in. Pt uses [facility] on [address]. Pt would like a call back.

## 2016-11-29 NOTE — Telephone Encounter (Signed)
Please notify patient that Tamilfu doesn't cure the flu, just helps to shorten the duration of symptoms. Looks like his symptoms have been present for 6 days, could very well still be viral. He can try a few things over the counter for his symptoms:  Nasal Congestion/Ear Pressure/Sinus Pressure: Try using Flonase (fluticasone) nasal spray. Instill 1 spray in each nostril twice daily.  Cough: Robitussin or Delsym Tylenol for headaches.

## 2016-11-29 NOTE — Telephone Encounter (Addendum)
Spoken and notified patient of Kate's comments. Patient verbalized understanding. 

## 2017-02-11 ENCOUNTER — Other Ambulatory Visit: Payer: Self-pay | Admitting: Primary Care

## 2017-03-02 ENCOUNTER — Other Ambulatory Visit: Payer: Self-pay | Admitting: Primary Care

## 2017-03-02 ENCOUNTER — Ambulatory Visit (INDEPENDENT_AMBULATORY_CARE_PROVIDER_SITE_OTHER): Payer: Medicare Other | Admitting: Primary Care

## 2017-03-02 ENCOUNTER — Encounter: Payer: Self-pay | Admitting: Primary Care

## 2017-03-02 VITALS — BP 122/82 | HR 67 | Temp 98.0°F | Ht 67.0 in | Wt 208.5 lb

## 2017-03-02 DIAGNOSIS — E1159 Type 2 diabetes mellitus with other circulatory complications: Secondary | ICD-10-CM

## 2017-03-02 LAB — HEMOGLOBIN A1C: HEMOGLOBIN A1C: 9.5 % — AB (ref 4.6–6.5)

## 2017-03-02 MED ORDER — INSULIN REGULAR HUMAN 100 UNIT/ML IJ SOLN: 25.0000 [IU] | Freq: Three times a day (TID) | INTRAMUSCULAR | 11 refills | Status: DC

## 2017-03-02 MED ORDER — INSULIN NPH (HUMAN) (ISOPHANE) 100 UNIT/ML ~~LOC~~ SUSP: SUBCUTANEOUS | 11 refills | Status: DC

## 2017-03-02 NOTE — Patient Instructions (Addendum)
Stop by the lab prior to leaving today. I will notify you of your results once received.   Continue Novolin N 25 units twice daily and Novolin R 25 units three times daily before meals for sugars above 110. We'll be in touch if we decide to change your dosing.  Continue exercising. You should be getting 150 minutes of moderate intensity exercise weekly.  Increase vegetables, fruit, whole grains, water.  We'll see you in September as scheduled.  It was a pleasure to see you today!

## 2017-03-02 NOTE — Assessment & Plan Note (Signed)
Much improved with Relion N 25 units BID and Relion R 25 units TID. Continue to monitor glucose levels. Repeat A1C pending.  Foot exam today. He will schedule his eye appointment. Vaccinations UTD. Managed on ACE and statin.  Follow up in 6 months if A1C at goal, 3 months if above goal.

## 2017-03-02 NOTE — Progress Notes (Signed)
Subjective:    Patient ID: [name], male    DOB: October 24, 1945, 72 y.o.   MRN: [id number]  HPI  [name] is a 72 year old male who presents today for follow up.  1) Type 2 Diabetes:  Current medications include: Relion N 25 units twice daily, Relion R 25 units three times daily.  He is checking his blood glucose 2-3 times daily and is getting readings of: AM Fasting: 70-175 Before Lunch: 125-175 Before Dinner: 125-175 Highest  Reading: 230 Lowest Reading: 60  Last A1C: 11.5 in August 2018 Last Eye Exam: Due, he will schedule Last Foot Exam: Due today. Pneumonia Vaccination: Completed in 2018 ACE/ARB: Lisinopril  Statin: Rosuvastatin   Diet currently consists of:  Breakfast: Bacon, sausage, egg Lunch: Sandwich Dinner: Beans, meat, potatoes, vegetables Snacks: Occasional chips  Desserts: 3 times weeklyy Beverages: Diet soda, water  Exercise: He does not exercise, plays golf 3-4 times weekly and rides mostly       Review of Systems  Respiratory: Negative for shortness of breath.   Cardiovascular: Negative for chest pain and palpitations.  Neurological: Negative for dizziness and numbness.       Past Medical History:  Diagnosis Date  . Arthritis    "fingers, shoulders" (08/03/2015)  . Atrial fibrillation (HCC)    Paroxysmal, rare episodes, sinus rhythm on flecainide, patient prefers not to take Coumadin  . Bradycardia    April, 2013  . Chest pain    Nuclear, 2006, no ischemia  . Chronic lower back pain    "all my life"  . Coronary artery disease    s/p staged cath 05/12/2014 and 5/11, DES x 2 to heavily calcified RCA, residual with 60% prox LAD, 70% D1  . Ejection fraction    EF 55-60%,  echo, January, 2011  . Elevated CPK    CPK elevated with normal MB and normal troponin the past  . Heart murmur   . History of echocardiogram    Echo 8/16: EF 55%, normal wall motion, grade 2 diastolic dysfunction, trivial MR, normal RV function, PASP 27 mmHg  .  Hypertension   . IBS (irritable bowel syndrome)   . Myocardial infarction ([place]) 06/2014  . Sinus drainage   . Type II diabetes mellitus ([place])      Social History   Socioeconomic History  . Marital status: Married    Spouse name: Not on file  . Number of children: Not on file  . Years of education: Not on file  . Highest education level: Not on file  Social Needs  . Financial resource strain: Not on file  . Food insecurity - worry: Not on file  . Food insecurity - inability: Not on file  . Transportation needs - medical: Not on file  . Transportation needs - non-medical: Not on file  Occupational History  . Not on file  Tobacco Use  . Smoking status: Never Smoker  . Smokeless tobacco: Never Used  Substance and Sexual Activity  . Alcohol use: No  . Drug use: No  . Sexual activity: Not on file  Other Topics Concern  . Not on file  Social History Narrative  . Not on file    Past Surgical History:  Procedure Laterality Date  . APPENDECTOMY    . CARDIAC CATHETERIZATION N/A 05/12/2014   Procedure: Left Heart Cath and Coronary Angiography;  Surgeon: Troy Sine, MD;  Location: [facility];  Service: Cardiovascular;  Laterality: N/A;  . CARDIAC CATHETERIZATION  N/A 05/13/2014   Procedure: Coronary/Graft Atherectomy;  Surgeon: Troy Sine, MD;  Location: [facility];  Service: Cardiovascular;  Laterality: N/A;  . CARDIAC CATHETERIZATION Right 05/13/2014   Procedure: Temporary Pacemaker;  Surgeon: Troy Sine, MD;  Location: [facility];  Service: Cardiovascular;  Laterality: Right;  . CARDIAC CATHETERIZATION N/A 05/13/2014   Procedure: Coronary Stent Intervention;  Surgeon: Troy Sine, MD;  Location: [facility];  Service: Cardiovascular;  Laterality: N/A;  . CARDIAC CATHETERIZATION N/A 06/18/2014   Procedure: Left Heart Cath and Coronary Angiography;  Surgeon: Peter M Martinique, MD;  Location: [facility];  Service: Cardiovascular;   Laterality: N/A;  . CARDIAC CATHETERIZATION N/A 08/03/2015   Procedure: Left Heart Cath and Cors/Grafts Angiography;  Surgeon: Nelva Bush, MD;  Location: [facility];  Service: Cardiovascular;  Laterality: N/A;  . CARDIOVASCULAR STRESS TEST  05/06/2014  . CATARACT EXTRACTION W/ INTRAOCULAR LENS IMPLANT Left   . CORONARY ARTERY BYPASS GRAFT N/A 06/22/2014   Procedure: CORONARY ARTERY BYPASS GRAFTING (CABG) x five, using left internal mammary artery, and right leg greater saphenous vein harvested endoscopically;  Surgeon: Melrose Nakayama, MD;  Location: [facility];  Service: Open Heart Surgery;  Laterality: N/A;  . LAPAROSCOPIC CHOLECYSTECTOMY    . MAZE N/A 06/22/2014   Procedure: MAZE;  Surgeon: Melrose Nakayama, MD;  Location: [facility];  Service: Open Heart Surgery;  Laterality: N/A;  . TEE WITHOUT CARDIOVERSION N/A 06/22/2014   Procedure: TRANSESOPHAGEAL ECHOCARDIOGRAM (TEE);  Surgeon: Melrose Nakayama, MD;  Location: [facility];  Service: Open Heart Surgery;  Laterality: N/A;    Family History  Problem Relation Age of Onset  . Alzheimer's disease Mother   . Emphysema Father   . Cancer Brother   . Arrhythmia Sister   . Heart attack Sister   . Heart disease Sister   . Hyperlipidemia Sister   . Hypertension Sister     Allergies  Allergen Reactions  . Lipitor [Atorvastatin] Other (See Comments)    Myopathy Spring 2006  . Pravastatin Other (See Comments)    LEG CRAMPS  . Simvastatin Other (See Comments)    LEG CRAMPS    Current Outpatient Medications on File Prior to Visit  Medication Sig Dispense Refill  . aspirin 81 MG tablet Take 81 mg by mouth daily.    . carvedilol (COREG) 6.25 MG tablet TAKE 1 TABLET BY MOUTH TWICE A DAY WITH FOOD 180 tablet 3  . dicyclomine (BENTYL) 20 MG tablet Take 20 mg 2 (two) times daily by mouth.    Arne [place] 5 MG TABS tablet TAKE 1 TABLET BY MOUTH TWICE A DAY 60 tablet 5  . fluorometholone (FML) 0.1 % ophthalmic suspension Place 1 drop into  the right eye daily as needed (for eye redness and itching).     . fluticasone (FLONASE) 50 MCG/ACT nasal spray SPRAY ONCE INTO EACH NOSTRIL AS NEEDED FOR ALLERGIES OR RHINITIS DAILY 16 g 2  . ibuprofen (ADVIL,MOTRIN) 200 MG tablet Take 200 mg by mouth daily as needed for headache or moderate pain.     . Insulin Pen Needle 31G X 8 MM MISC Use as directed with Lantus. 100 each 5  . isosorbide mononitrate (IMDUR) 30 MG 24 hr tablet TAKE 1 TABLET BY MOUTH EVERY DAY 90 tablet 3  . lisinopril (PRINIVIL,ZESTRIL) 5 MG tablet Take 1 tablet (5 mg total) daily by mouth. 90 tablet 3  . MAGNESIUM PO Take 1 tablet by  mouth daily as needed (for leg cramping).     . nitroGLYCERIN (NITROSTAT) 0.4 MG SL tablet Place 1 tablet (0.4 mg total) under the tongue every 5 (five) minutes as needed for chest pain. 25 tablet 3  . rosuvastatin (CRESTOR) 10 MG tablet TAKE 1 TABLET BY MOUTH EVERY DAY 90 tablet 3  . tamsulosin (FLOMAX) 0.4 MG CAPS capsule Take 1 capsule (0.4 mg total) by mouth as needed. For difficulty urinating. 90 capsule 1  . traMADol (ULTRAM) 50 MG tablet Take 1 tablet (50 mg total) by mouth every 12 (twelve) hours as needed for severe pain. 14 tablet 0   No current facility-administered medications on file prior to visit.     BP 122/82   Pulse 67   Temp 98 F (36.7 C) (Oral)   Ht 5\' 7"  (1.702 m)   Wt 208 lb 8 oz (94.6 kg)   SpO2 99%   BMI 32.66 kg/m    Objective:   Physical Exam  Constitutional: He is oriented to person, place, and time. He appears well-nourished.  Neck: Neck supple.  Cardiovascular: Normal rate and regular rhythm.  Pulmonary/Chest: Effort normal and breath sounds normal. He has no wheezes. He has no rales.  Neurological: He is alert and oriented to person, place, and time.  Skin: Skin is warm and dry.          Assessment & Plan:

## 2017-03-07 ENCOUNTER — Telehealth: Payer: Self-pay

## 2017-03-07 NOTE — Telephone Encounter (Signed)
Pt came by and brought the insulin he has available to him. It is regular Humalog.

## 2017-03-07 NOTE — Telephone Encounter (Signed)
Please notify patient that he may use the humalog three times daily at 25 units in place of Novolin R, hold his Novolin R vial. Continue Novolin N at 30 units twice daily.

## 2017-03-08 NOTE — Telephone Encounter (Signed)
[name] from [facility] transferred pt to me; pt upset because has not gotten cb and has not gotten lab results from 03/02/17. I apologized and explained the lab results was sent to [facility] but I relayed the mychart message to pt and he voiced understanding. Also notified pt from the message from this note and pt voiced understanding. I apologized again to pt and he said that was OK.

## 2017-03-27 ENCOUNTER — Ambulatory Visit: Payer: Self-pay

## 2017-03-27 NOTE — Telephone Encounter (Signed)
Pt. Reports he has "a pretty bad cough - keeping me awake at night." Has sore throat and runny nose as well. Denies fever. Tried OTC cough medication - not working. Appointment scheduled for tomorrow. Reason for Disposition . [1] Continuous (nonstop) coughing interferes with work or school AND [2] no improvement using cough treatment per Care Advice  Answer Assessment - Initial Assessment Questions 1. ONSET: "When did the cough begin?"      Started yesterday 2. SEVERITY: "How bad is the cough today?"      Severe 3. RESPIRATORY DISTRESS: "Describe your breathing."      No distress 4. FEVER: "Do you have a fever?" If so, ask: "What is your temperature, how was it measured, and when did it start?"     No 5. SPUTUM: "Describe the color of your sputum" (clear, white, yellow, green)     Yellowish 6. HEMOPTYSIS: "Are you coughing up any blood?" If so ask: "How much?" (flecks, streaks, tablespoons, etc.)     No 7. CARDIAC HISTORY: "Do you have any history of heart disease?" (e.g., heart attack, congestive heart failure)      CABG 2 YEARS 8. LUNG HISTORY: "Do you have any history of lung disease?"  (e.g., pulmonary embolus, asthma, emphysema)     No 9. PE RISK FACTORS: "Do you have a history of blood clots?" (or: recent major surgery, recent prolonged travel, bedridden )     No 10. OTHER SYMPTOMS: "Do you have any other symptoms?" (e.g., runny nose, wheezing, chest pain)       Sore throat, runny nose 11. PREGNANCY: "Is there any chance you are pregnant?" "When was your last menstrual period?"       No 12. TRAVEL: "Have you traveled out of the country in the last month?" (e.g., travel history, exposures)       No  Protocols used: [place]

## 2017-03-28 ENCOUNTER — Encounter: Payer: Self-pay | Admitting: Family Medicine

## 2017-03-28 ENCOUNTER — Ambulatory Visit (INDEPENDENT_AMBULATORY_CARE_PROVIDER_SITE_OTHER): Payer: Medicare Other | Admitting: Family Medicine

## 2017-03-28 VITALS — BP 106/50 | HR 62 | Temp 98.5°F | Wt 208.2 lb

## 2017-03-28 DIAGNOSIS — J069 Acute upper respiratory infection, unspecified: Secondary | ICD-10-CM | POA: Diagnosis not present

## 2017-03-28 DIAGNOSIS — R062 Wheezing: Secondary | ICD-10-CM | POA: Diagnosis not present

## 2017-03-28 DIAGNOSIS — B9789 Other viral agents as the cause of diseases classified elsewhere: Secondary | ICD-10-CM | POA: Diagnosis not present

## 2017-03-28 DIAGNOSIS — R05 Cough: Secondary | ICD-10-CM | POA: Diagnosis not present

## 2017-03-28 DIAGNOSIS — R059 Cough, unspecified: Secondary | ICD-10-CM

## 2017-03-28 DIAGNOSIS — Z7901 Long term (current) use of anticoagulants: Secondary | ICD-10-CM

## 2017-03-28 MED ORDER — IPRATROPIUM-ALBUTEROL 0.5-2.5 (3) MG/3ML IN SOLN
3.0000 mL | Freq: Once | RESPIRATORY_TRACT | Status: AC
  Administered 2017-03-28: 3 mL via RESPIRATORY_TRACT

## 2017-03-28 MED ORDER — ALBUTEROL SULFATE HFA 108 (90 BASE) MCG/ACT IN AERS: 2.0000 | INHALATION_SPRAY | RESPIRATORY_TRACT | 1 refills | Status: DC | PRN

## 2017-03-28 MED ORDER — BENZONATATE 100 MG PO CAPS: 100.0000 mg | ORAL_CAPSULE | Freq: Three times a day (TID) | ORAL | 0 refills | Status: DC | PRN

## 2017-03-28 MED ORDER — AMOXICILLIN 875 MG PO TABS: 875.0000 mg | ORAL_TABLET | Freq: Two times a day (BID) | ORAL | 0 refills | Status: DC

## 2017-03-28 NOTE — Patient Instructions (Addendum)
For nasal congestion you can use saline nasal spray For cough I have sent in cough pills Drink enough fluids to make your urine light yellow. For fever/chill/muscle aches you can take over the counter acetaminophen (Tylenol) 2 tablets every 8 to 12 hours Do not take ibuprofen while on Eliquis If not better in 3-4 days, can start antibiotic  Please come back in if you are not better in 5-7 days or if you develop wheezing, shortness of breath or persistent vomiting.   How to Use a Metered Dose Inhaler A metered dose inhaler is a handheld device for taking medicine that must be breathed into the lungs (inhaled). The device can be used to deliver a variety of inhaled medicines, including:  Quick relief or rescue medicines, such as bronchodilators.  Controller medicines, such as corticosteroids.  The medicine is delivered by pushing down on a metal canister to release a preset amount of spray and medicine. Each device contains the amount of medicine that is needed for a preset number of uses (inhalations). Your health care provider may recommend that you use a spacer with your inhaler to help you take the medicine more effectively. A spacer is a plastic tube with a mouthpiece on one end and an opening that connects to the inhaler on the other end. A spacer holds the medicine in a tube for a short time, which allows you to inhale more medicine. What are the risks? If you do not use your inhaler correctly, medicine might not reach your lungs to help you breathe. Inhaler medicine can cause side effects, such as:  Mouth or throat infection.  Cough.  Hoarseness.  Headache.  Nausea and vomiting.  Lung infection (pneumonia) in people who have a lung condition called COPD.  How to use a metered dose inhaler without a spacer 1. Remove the cap from the inhaler. 2. If you are using the inhaler for the first time, shake it for 5 seconds, turn it away from your face, then release 4 puffs into the  air. This is called priming. 3. Shake the inhaler for 5 seconds. 4. Position the inhaler so the top of the canister faces up. 5. Put your index finger on the top of the medicine canister. Support the bottom of the inhaler with your thumb. 6. Breathe out normally and as completely as possible, away from the inhaler. 7. Either place the inhaler between your teeth and close your lips tightly around the mouthpiece, or hold the inhaler 1-2 inches (2.5-5 cm) away from your open mouth. Keep your tongue down out of the way. If you are unsure which technique to use, ask your health care provider. 8. Press the canister down with your index finger to release the medicine, then inhale deeply and slowly through your mouth (not your nose) until your lungs are completely filled. Inhaling should take 4-6 seconds. 9. Hold the medicine in your lungs for 5-10 seconds (10 seconds is best). This helps the medicine get into the small airways of your lungs. 10. With your lips in a tight circle (pursed), breathe out slowly. 11. Repeat steps 3-10 until you have taken the number of puffs that your health care provider directed. Wait about 1 minute between puffs or as directed. 12. Put the cap on the inhaler. 13. If you are using a steroid inhaler, rinse your mouth with water, gargle, and spit out the water. Do not swallow the water. How to use a metered dose inhaler with a spacer 1. Remove the  cap from the inhaler. 2. If you are using the inhaler for the first time, shake it for 5 seconds, turn it away from your face, then release 4 puffs into the air. This is called priming. 3. Shake the inhaler for 5 seconds. 4. Place the open end of the spacer onto the inhaler mouthpiece. 5. Position the inhaler so the top of the canister faces up and the spacer mouthpiece faces you. 6. Put your index finger on the top of the medicine canister. Support the bottom of the inhaler and the spacer with your thumb. 7. Breathe out normally and  as completely as possible, away from the spacer. 8. Place the spacer between your teeth and close your lips tightly around it. Keep your tongue down out of the way. 9. Press the canister down with your index finger to release the medicine, then inhale deeply and slowly through your mouth (not your nose) until your lungs are completely filled. Inhaling should take 4-6 seconds. 10. Hold the medicine in your lungs for 5-10 seconds (10 seconds is best). This helps the medicine get into the small airways of your lungs. 11. With your lips in a tight circle (pursed), breathe out slowly. 12. Repeat steps 3-11 until you have taken the number of puffs that your health care provider directed. Wait about 1 minute between puffs or as directed. 13. Remove the spacer from the inhaler and put the cap on the inhaler. 14. If you are using a steroid inhaler, rinse your mouth with water, gargle, and spit out the water. Do not swallow the water. Follow these instructions at home:  Take your inhaled medicine only as told by your health care provider. Do not use the inhaler more than directed by your health care provider.  Keep all follow-up visits as told by your health care provider. This is important.  If your inhaler has a counter, you can check it to determine how full your inhaler is. If your inhaler does not have a counter, ask your health care provider when you will need to refill your inhaler and write the refill date on a calendar or on your inhaler canister. Note that you cannot know when an inhaler is empty by shaking it.  Follow directions on the package insert for care and cleaning of your inhaler and spacer. Contact a health care provider if:  Symptoms are only partially relieved with your inhaler.  You are having trouble using your inhaler.  You have an increase in phlegm.  You have headaches. Get help right away if:  You feel little or no relief after using your inhaler.  You have  dizziness.  You have a fast heart rate.  You have chills or a fever.  You have night sweats.  There is blood in your phlegm. Summary  A metered dose inhaler is a handheld device for taking medicine that must be breathed into the lungs (inhaled).  The medicine is delivered by pushing down on a metal canister to release a preset amount of spray and medicine.  Each device contains the amount of medicine that is needed for a preset number of uses (inhalations). This information is not intended to replace advice given to you by your health care provider. Make sure you discuss any questions you have with your health care provider. Document Released: 12/19/2004 Document Revised: 11/09/2015 Document Reviewed: 11/09/2015 Elsevier Interactive Patient Education  2017 [employer].

## 2017-03-28 NOTE — Progress Notes (Signed)
Subjective:    Patient ID: [name], male    DOB: [birth year], 72 y.o.   MRN: [id number]  HPI This is a 72 yo male who presents today with cough, post nasal drainage, achy all over x 3 days, diarrhea x 2 days. Headache with cough. Some wheeze, SOB with cough. Has been taking Robitussin chest congestion and gets relief for about 2 hours. Poor sleep last night. Two bowel movements today. Thinks about 4-5 yesterday. No abdominal pain or cramping. Has ibuprofen on med list, states he take occasionally.  Blood sugars running low 100s. Doing well on new insulin regimen.   Past Medical History:  Diagnosis Date  . Arthritis    "fingers, shoulders" (08/03/2015)  . Atrial fibrillation (HCC)    Paroxysmal, rare episodes, sinus rhythm on flecainide, patient prefers not to take Coumadin  . Bradycardia    April, 2013  . Chest pain    Nuclear, 2006, no ischemia  . Chronic lower back pain    "all my life"  . Coronary artery disease    s/p staged cath 05/12/2014 and 5/11, DES x 2 to heavily calcified RCA, residual with 60% prox LAD, 70% D1  . Ejection fraction    EF 55-60%,  echo, January, 2011  . Elevated CPK    CPK elevated with normal MB and normal troponin the past  . Heart murmur   . History of echocardiogram    Echo 8/16: EF 55%, normal wall motion, grade 2 diastolic dysfunction, trivial MR, normal RV function, PASP 27 mmHg  . Hypertension   . IBS (irritable bowel syndrome)   . Myocardial infarction ([place]) 06/2014  . Sinus drainage   . Type II diabetes mellitus ([place])    Past Surgical History:  Procedure Laterality Date  . APPENDECTOMY    . CARDIAC CATHETERIZATION N/A 05/12/2014   Procedure: Left Heart Cath and Coronary Angiography;  Surgeon: Troy Sine, MD;  Location: [facility];  Service: Cardiovascular;  Laterality: N/A;  . CARDIAC CATHETERIZATION N/A 05/13/2014   Procedure: Coronary/Graft Atherectomy;  Surgeon: Troy Sine, MD;  Location: [facility];  Service:  Cardiovascular;  Laterality: N/A;  . CARDIAC CATHETERIZATION Right 05/13/2014   Procedure: Temporary Pacemaker;  Surgeon: Troy Sine, MD;  Location: [facility];  Service: Cardiovascular;  Laterality: Right;  . CARDIAC CATHETERIZATION N/A 05/13/2014   Procedure: Coronary Stent Intervention;  Surgeon: Troy Sine, MD;  Location: [facility];  Service: Cardiovascular;  Laterality: N/A;  . CARDIAC CATHETERIZATION N/A 06/18/2014   Procedure: Left Heart Cath and Coronary Angiography;  Surgeon: Peter M Martinique, MD;  Location: [facility];  Service: Cardiovascular;  Laterality: N/A;  . CARDIAC CATHETERIZATION N/A 08/03/2015   Procedure: Left Heart Cath and Cors/Grafts Angiography;  Surgeon: Nelva Bush, MD;  Location: [facility];  Service: Cardiovascular;  Laterality: N/A;  . CARDIOVASCULAR STRESS TEST  05/06/2014  . CATARACT EXTRACTION W/ INTRAOCULAR LENS IMPLANT Left   . CORONARY ARTERY BYPASS GRAFT N/A 06/22/2014   Procedure: CORONARY ARTERY BYPASS GRAFTING (CABG) x five, using left internal mammary artery, and right leg greater saphenous vein harvested endoscopically;  Surgeon: Melrose Nakayama, MD;  Location: [facility];  Service: Open Heart Surgery;  Laterality: N/A;  . LAPAROSCOPIC CHOLECYSTECTOMY    . MAZE N/A 06/22/2014   Procedure: MAZE;  Surgeon: Melrose Nakayama, MD;  Location: [facility];  Service: Open Heart Surgery;  Laterality: N/A;  . TEE WITHOUT  CARDIOVERSION N/A 06/22/2014   Procedure: TRANSESOPHAGEAL ECHOCARDIOGRAM (TEE);  Surgeon: Melrose Nakayama, MD;  Location: [facility];  Service: Open Heart Surgery;  Laterality: N/A;   Family History  Problem Relation Age of Onset  . Alzheimer's disease Mother   . Emphysema Father   . Cancer Brother   . Arrhythmia Sister   . Heart attack Sister   . Heart disease Sister   . Hyperlipidemia Sister   . Hypertension Sister    Social History   Tobacco Use  . Smoking status: Never Smoker  . Smokeless tobacco:  Never Used  Substance Use Topics  . Alcohol use: No  . Drug use: No      Review of Systems Per HPI    Objective:   Physical Exam  Constitutional: He is oriented to person, place, and time. He appears well-developed and well-nourished. No distress.  HENT:  Head: Normocephalic and atraumatic.  Right Ear: Tympanic membrane, external ear and ear canal normal.  Left Ear: Tympanic membrane, external ear and ear canal normal.  Nose: No mucosal edema or rhinorrhea.  Mouth/Throat: Uvula is midline and mucous membranes are normal. Posterior oropharyngeal erythema (mild with post nasal drainage) present. No oropharyngeal exudate or posterior oropharyngeal edema.  Eyes: Conjunctivae are normal.  Neck: Normal range of motion. Neck supple.  Cardiovascular: Normal rate, regular rhythm and normal heart sounds.  Pulmonary/Chest: Effort normal. No respiratory distress. He has wheezes (scattered posterior expiratory). He has no rales.  Frequent dry cough.   Musculoskeletal: He exhibits no edema.  Lymphadenopathy:    He has no cervical adenopathy.  Neurological: He is alert and oriented to person, place, and time.  Skin: Skin is warm and dry. He is not diaphoretic.  Psychiatric: He has a normal mood and affect. His behavior is normal. Judgment and thought content normal.  Vitals reviewed.        BP (!) 106/50   Pulse 62   Temp 98.5 F (36.9 C) (Oral)   Wt 208 lb 4 oz (94.5 kg)   SpO2 97%   BMI 32.62 kg/m  Wt Readings from Last 3 Encounters:  03/28/17 208 lb 4 oz (94.5 kg)  03/02/17 208 lb 8 oz (94.6 kg)  11/25/16 205 lb (93 kg)   BP Readings from Last 3 Encounters:  03/28/17 (!) 106/50  03/02/17 122/82  11/25/16 120/82  Patient given duo neb nebulizer treatment in office with resolution of wheeze and cough  Assessment & Plan:  1. Cough - ipratropium-albuterol (DUONEB) 0.5-2.5 (3) MG/3ML nebulizer solution 3 mL - benzonatate (TESSALON) 100 MG capsule; Take 1-2 capsules (100-200  mg total) by mouth 3 (three) times daily as needed.  Dispense: 20 capsule; Refill: 0  2. Wheeze - ipratropium-albuterol (DUONEB) 0.5-2.5 (3) MG/3ML nebulizer solution 3 mL - albuterol (PROVENTIL HFA;VENTOLIN HFA) 108 (90 Base) MCG/ACT inhaler; Inhale 2 puffs into the lungs every 4 (four) hours as needed for wheezing or shortness of breath (cough, shortness of breath or wheezing.).  Dispense: 1 Inhaler; Refill: 1  3. Viral URI with cough - Provided written and verbal information regarding diagnosis and treatment. - If not improvement, can start antibiotic in 3-4 days - albuterol (PROVENTIL HFA;VENTOLIN HFA) 108 (90 Base) MCG/ACT inhaler; Inhale 2 puffs into the lungs every 4 (four) hours as needed for wheezing or shortness of breath (cough, shortness of breath or wheezing.).  Dispense: 1 Inhaler; Refill: 1- written and verbal instructions for use provided - benzonatate (TESSALON) 100 MG capsule; Take 1-2 capsules (100-200 mg  total) by mouth 3 (three) times daily as needed.  Dispense: 20 capsule; Refill: 0 - amoxicillin (AMOXIL) 875 MG tablet; Take 1 tablet (875 mg total) by mouth 2 (two) times daily.  Dispense: 14 tablet; Refill: 0 - RTC precautions reviewed  4. Anticoagulant long-term use - Advised him to avoid NSAIDs and use acetaminophen instead   Clarene Reamer, FNP-BC  [facility]  03/28/2017 1:40 PM

## 2017-04-02 ENCOUNTER — Telehealth: Payer: Self-pay | Admitting: Primary Care

## 2017-04-02 NOTE — Telephone Encounter (Signed)
Copied from [facility] [id number]. Topic: Quick Communication - See Telephone Encounter >> Apr 02, 2017  2:08 PM Vernona Rieger wrote: CRM for notification. See Telephone encounter for: 04/02/17.  Pt said that he was in on 3/27. He states he is not getting any better. He was not prescribed anything, he said he is coughing up yellow mucus & is very congested. He wants something called in  [id number] [place] [place]

## 2017-04-02 NOTE — Telephone Encounter (Signed)
Called and spoke with patient. Continued cough, little sputum, feels like "it is stuck in my throat." Using albuterol 4/x day, taking amoxicillin and taking benzonate. No fever, good appetite and fluid intake. Able to converse in full sentences. Encouraged him to take mucinex twice a day, push fluids, continue current meds, follow up with office visit if no improvement in 24-48 hours.

## 2017-04-02 NOTE — Telephone Encounter (Signed)
I spoke with pt and he has prod cough with yellow phlegm; when has coughing episode has problem catching his breath. Slight wheezing and congested in chest. Pt will finish abx on 04/03/17. No fever. [facility] [place].

## 2017-07-03 ENCOUNTER — Other Ambulatory Visit: Payer: Self-pay | Admitting: Interventional Cardiology

## 2017-07-03 NOTE — Telephone Encounter (Signed)
Pt is a 72 yr old male who last saw Dr Irish Lack on 11/10/16. Last noted weight was 94.5Kg on 03/28/17. SCr on 08/24/16 was 1.05. Will refill Eliquis 58m BID.

## 2017-07-04 DIAGNOSIS — H5712 Ocular pain, left eye: Secondary | ICD-10-CM | POA: Diagnosis not present

## 2017-07-04 DIAGNOSIS — S0502XA Injury of conjunctiva and corneal abrasion without foreign body, left eye, initial encounter: Secondary | ICD-10-CM | POA: Diagnosis not present

## 2017-09-04 ENCOUNTER — Other Ambulatory Visit: Payer: Self-pay | Admitting: Primary Care

## 2017-09-04 DIAGNOSIS — Z125 Encounter for screening for malignant neoplasm of prostate: Secondary | ICD-10-CM

## 2017-09-04 DIAGNOSIS — I10 Essential (primary) hypertension: Secondary | ICD-10-CM

## 2017-09-04 DIAGNOSIS — E782 Mixed hyperlipidemia: Secondary | ICD-10-CM

## 2017-09-04 DIAGNOSIS — E1159 Type 2 diabetes mellitus with other circulatory complications: Secondary | ICD-10-CM

## 2017-09-05 ENCOUNTER — Ambulatory Visit (INDEPENDENT_AMBULATORY_CARE_PROVIDER_SITE_OTHER): Payer: Medicare Other

## 2017-09-05 VITALS — BP 124/68 | HR 71 | Temp 97.9°F | Ht 67.5 in | Wt 207.8 lb

## 2017-09-05 DIAGNOSIS — I10 Essential (primary) hypertension: Secondary | ICD-10-CM

## 2017-09-05 DIAGNOSIS — E782 Mixed hyperlipidemia: Secondary | ICD-10-CM | POA: Diagnosis not present

## 2017-09-05 DIAGNOSIS — Z0000 Encounter for general adult medical examination without abnormal findings: Secondary | ICD-10-CM

## 2017-09-05 DIAGNOSIS — E1159 Type 2 diabetes mellitus with other circulatory complications: Secondary | ICD-10-CM | POA: Diagnosis not present

## 2017-09-05 DIAGNOSIS — Z125 Encounter for screening for malignant neoplasm of prostate: Secondary | ICD-10-CM | POA: Diagnosis not present

## 2017-09-05 LAB — COMPREHENSIVE METABOLIC PANEL
ALT: 22 U/L (ref 0–53)
AST: 12 U/L (ref 0–37)
Albumin: 4.1 g/dL (ref 3.5–5.2)
Alkaline Phosphatase: 50 U/L (ref 39–117)
BUN: 15 mg/dL (ref 6–23)
CALCIUM: 9.4 mg/dL (ref 8.4–10.5)
CO2: 29 meq/L (ref 19–32)
Chloride: 105 mEq/L (ref 96–112)
Creatinine, Ser: 1.04 mg/dL (ref 0.40–1.50)
GFR: 74.56 mL/min (ref >60.00)
GLUCOSE: 235 mg/dL — AB (ref 70–99)
POTASSIUM: 4.7 meq/L (ref 3.5–5.1)
Sodium: 139 mEq/L (ref 135–145)
Total Bilirubin: 0.6 mg/dL (ref 0.2–1.2)
Total Protein: 6.7 g/dL (ref 6.0–8.3)

## 2017-09-05 LAB — LIPID PANEL
Cholesterol: 98 mg/dL (ref 0–200)
HDL: 38.2 mg/dL — AB (ref >39.00)
LDL Cholesterol: 46 mg/dL (ref 0–99)
NONHDL: 59.56
TRIGLYCERIDES: 68 mg/dL (ref 0.0–149.0)
Total CHOL/HDL Ratio: 3
VLDL: 13.6 mg/dL (ref 0.0–40.0)

## 2017-09-05 LAB — PSA, MEDICARE: PSA: 1.43 ng/mL (ref 0.10–4.00)

## 2017-09-05 LAB — HEMOGLOBIN A1C: Hgb A1c MFr Bld: 10.3 % — ABNORMAL HIGH (ref 4.6–6.5)

## 2017-09-05 NOTE — Patient Instructions (Signed)
[name] , Thank you for taking time to come for your Medicare Wellness Visit. I appreciate your ongoing commitment to your health goals. Please review the following plan we discussed and let me know if I can assist you in the future.   These are the goals we discussed: Goals    . Increase physical activity     Starting 09/05/2017, I will continue to play golf for at least 4 hours 3 days per week and do yard work as needed.        This is a list of the screening recommended for you and due dates:  Health Maintenance  Topic Date Due  . Flu Shot  04/03/2018*  . Eye exam for diabetics  02/17/2018  . Complete foot exam   03/03/2018  . Hemoglobin A1C  03/06/2018  . Colon Cancer Screening  04/29/2018  . Tetanus Vaccine  01/15/2021  .  Hepatitis C: One time screening is recommended by Center for Disease Control  (CDC) for  adults born from [birth year] through 1965.   Completed  . Pneumonia vaccines  Completed  *Topic was postponed. The date shown is not the original due date.   Preventive Care for Adults  A healthy lifestyle and preventive care can promote health and wellness. Preventive health guidelines for adults include the following key practices.  . A routine yearly physical is a good way to check with your health care provider about your health and preventive screening. It is a chance to share any concerns and updates on your health and to receive a thorough exam.  . Visit your dentist for a routine exam and preventive care every 6 months. Brush your teeth twice a day and floss once a day. Good oral hygiene prevents tooth decay and gum disease.  . The frequency of eye exams is based on your age, health, family medical history, use  of contact lenses, and other factors. Follow your health care provider's recommendations for frequency of eye exams.  . Eat a healthy diet. Foods like vegetables, fruits, whole grains, low-fat dairy products, and lean protein foods contain the nutrients you  need without too many calories. Decrease your intake of foods high in solid fats, added sugars, and salt. Eat the right amount of calories for you. Get information about a proper diet from your health care provider, if necessary.  . Regular physical exercise is one of the most important things you can do for your health. Most adults should get at least 150 minutes of moderate-intensity exercise (any activity that increases your heart rate and causes you to sweat) each week. In addition, most adults need muscle-strengthening exercises on 2 or more days a week.  Silver Sneakers may be a benefit available to you. To determine eligibility, you may visit the website: www.silversneakers.com or contact program at 714-057-8602 Mon-Fri between 8AM-8PM.   . Maintain a healthy weight. The body mass index (BMI) is a screening tool to identify possible weight problems. It provides an estimate of body fat based on height and weight. Your health care provider can find your BMI and can help you achieve or maintain a healthy weight.   For adults 20 years and older: ? A BMI below 18.5 is considered underweight. ? A BMI of 18.5 to 24.9 is normal. ? A BMI of 25 to 29.9 is considered overweight. ? A BMI of 30 and above is considered obese.   . Maintain normal blood lipids and cholesterol levels by exercising and minimizing your intake  of saturated fat. Eat a balanced diet with plenty of fruit and vegetables. Blood tests for lipids and cholesterol should begin at age 17 and be repeated every 5 years. If your lipid or cholesterol levels are high, you are over 50, or you are at high risk for heart disease, you may need your cholesterol levels checked more frequently. Ongoing high lipid and cholesterol levels should be treated with medicines if diet and exercise are not working.  . If you smoke, find out from your health care provider how to quit. If you do not use tobacco, please do not start.  . If you choose to drink  alcohol, please do not consume more than 2 drinks per day. One drink is considered to be 12 ounces (355 mL) of beer, 5 ounces (148 mL) of wine, or 1.5 ounces (44 mL) of liquor.  . If you are 69-79 years old, ask your health care provider if you should take aspirin to prevent strokes.  . Use sunscreen. Apply sunscreen liberally and repeatedly throughout the day. You should seek shade when your shadow is shorter than you. Protect yourself by wearing long sleeves, pants, a wide-brimmed hat, and sunglasses year round, whenever you are outdoors.  . Once a month, do a whole body skin exam, using a mirror to look at the skin on your back. Tell your health care provider of new moles, moles that have irregular borders, moles that are larger than a pencil eraser, or moles that have changed in shape or color.

## 2017-09-05 NOTE — Progress Notes (Signed)
PCP notes:   Health maintenance:  Flu vaccine - addressed A1C - completed  Abnormal screenings:   Hearing - failed  Hearing Screening   125Hz  250Hz  500Hz  1000Hz  2000Hz  3000Hz  4000Hz  6000Hz  8000Hz   Right ear:   40 40 40  0    Left ear:   40 40 40  40     Patient concerns:   None  Nurse concerns:  None  Next PCP appt:   09/07/17 @ 0740

## 2017-09-05 NOTE — Progress Notes (Signed)
Subjective:   [name] is a 72 y.o. male who presents for Medicare Annual/Subsequent preventive examination.  Review of Systems:  N/A Cardiac Risk Factors include: advanced age (>63men, >75 women);male gender;diabetes mellitus;dyslipidemia;hypertension;obesity (BMI >30kg/m2)     Objective:    Vitals: BP 124/68 (BP Location: Right Arm, Patient Position: Sitting, Cuff Size: Normal)   Pulse 71   Temp 97.9 F (36.6 C) (Oral)   Ht 5' 7.5" (1.715 m) Comment: no shoes  Wt 207 lb 12 oz (94.2 kg)   SpO2 95%   BMI 32.06 kg/m   Body mass index is 32.06 kg/m.  Advanced Directives 09/05/2017 08/24/2016 08/03/2015 06/18/2014 06/17/2014 05/12/2014  Does Patient Have a Medical Advance Directive? Yes Yes Yes Yes No Yes  Type of Paramedic of [place];Living will [place];Living will Living will Living will;Healthcare Power of Attorney - Living will;Healthcare Power of Attorney  Does patient want to make changes to medical advance directive? - - No - Patient declined No - Patient declined - No - Patient declined  Copy of [place] in Chart? No - copy requested No - copy requested No - copy requested No - copy requested - No - copy requested    Tobacco Social History   Tobacco Use  Smoking Status Never Smoker  Smokeless Tobacco Never Used     Counseling given: No   Clinical Intake:  Pre-visit preparation completed: Yes  Pain : No/denies pain Pain Score: 0-No pain     Nutritional Status: BMI > 30  Obese Nutritional Risks: None Diabetes: Yes CBG done?: No Did pt. bring in CBG monitor from home?: No  How often do you need to have someone help you when you read instructions, pamphlets, or other written materials from your doctor or pharmacy?: 1 - Never What is the last grade level you completed in school?: 12th grade  Interpreter Needed?: No  Comments: pt lives wtih spouse Information entered by :: LPinson,  LPN  Past Medical History:  Diagnosis Date  . Arthritis    "fingers, shoulders" (08/03/2015)  . Atrial fibrillation (HCC)    Paroxysmal, rare episodes, sinus rhythm on flecainide, patient prefers not to take Coumadin  . Bradycardia    April, 2013  . Chest pain    Nuclear, 2006, no ischemia  . Chronic lower back pain    "all my life"  . Coronary artery disease    s/p staged cath 05/12/2014 and 5/11, DES x 2 to heavily calcified RCA, residual with 60% prox LAD, 70% D1  . Ejection fraction    EF 55-60%,  echo, January, 2011  . Elevated CPK    CPK elevated with normal MB and normal troponin the past  . Heart murmur   . History of echocardiogram    Echo 8/16: EF 55%, normal wall motion, grade 2 diastolic dysfunction, trivial MR, normal RV function, PASP 27 mmHg  . Hypertension   . IBS (irritable bowel syndrome)   . Myocardial infarction ([place]) 06/2014  . Sinus drainage   . Type II diabetes mellitus ([place])    Past Surgical History:  Procedure Laterality Date  . APPENDECTOMY    . CARDIAC CATHETERIZATION N/A 05/12/2014   Procedure: Left Heart Cath and Coronary Angiography;  Surgeon: Troy Sine, MD;  Location: [facility];  Service: Cardiovascular;  Laterality: N/A;  . CARDIAC CATHETERIZATION N/A 05/13/2014   Procedure: Coronary/Graft Atherectomy;  Surgeon: Troy Sine, MD;  Location: [facility];  Service: Cardiovascular;  Laterality: N/A;  . CARDIAC CATHETERIZATION Right 05/13/2014   Procedure: Temporary Pacemaker;  Surgeon: Troy Sine, MD;  Location: [facility];  Service: Cardiovascular;  Laterality: Right;  . CARDIAC CATHETERIZATION N/A 05/13/2014   Procedure: Coronary Stent Intervention;  Surgeon: Troy Sine, MD;  Location: [facility];  Service: Cardiovascular;  Laterality: N/A;  . CARDIAC CATHETERIZATION N/A 06/18/2014   Procedure: Left Heart Cath and Coronary Angiography;  Surgeon: Peter M Martinique, MD;  Location: [facility];  Service:  Cardiovascular;  Laterality: N/A;  . CARDIAC CATHETERIZATION N/A 08/03/2015   Procedure: Left Heart Cath and Cors/Grafts Angiography;  Surgeon: Nelva Bush, MD;  Location: [facility];  Service: Cardiovascular;  Laterality: N/A;  . CARDIOVASCULAR STRESS TEST  05/06/2014  . CATARACT EXTRACTION W/ INTRAOCULAR LENS IMPLANT Left   . CORONARY ARTERY BYPASS GRAFT N/A 06/22/2014   Procedure: CORONARY ARTERY BYPASS GRAFTING (CABG) x five, using left internal mammary artery, and right leg greater saphenous vein harvested endoscopically;  Surgeon: Melrose Nakayama, MD;  Location: [facility];  Service: Open Heart Surgery;  Laterality: N/A;  . LAPAROSCOPIC CHOLECYSTECTOMY    . MAZE N/A 06/22/2014   Procedure: MAZE;  Surgeon: Melrose Nakayama, MD;  Location: [facility];  Service: Open Heart Surgery;  Laterality: N/A;  . TEE WITHOUT CARDIOVERSION N/A 06/22/2014   Procedure: TRANSESOPHAGEAL ECHOCARDIOGRAM (TEE);  Surgeon: Melrose Nakayama, MD;  Location: [facility];  Service: Open Heart Surgery;  Laterality: N/A;   Family History  Problem Relation Age of Onset  . Alzheimer's disease Mother   . Emphysema Father   . Cancer Brother   . Arrhythmia Sister   . Heart attack Sister   . Heart disease Sister   . Hyperlipidemia Sister   . Hypertension Sister    Social History   Socioeconomic History  . Marital status: Married    Spouse name: Not on file  . Number of children: Not on file  . Years of education: Not on file  . Highest education level: Not on file  Occupational History  . Not on file  Social Needs  . Financial resource strain: Not on file  . Food insecurity:    Worry: Not on file    Inability: Not on file  . Transportation needs:    Medical: Not on file    Non-medical: Not on file  Tobacco Use  . Smoking status: Never Smoker  . Smokeless tobacco: Never Used  Substance and Sexual Activity  . Alcohol use: No  . Drug use: No  . Sexual activity: Not on file  Lifestyle  . Physical  activity:    Days per week: Not on file    Minutes per session: Not on file  . Stress: Not on file  Relationships  . Social connections:    Talks on phone: Not on file    Gets together: Not on file    Attends religious service: Not on file    Active member of club or organization: Not on file    Attends meetings of clubs or organizations: Not on file    Relationship status: Not on file  Other Topics Concern  . Not on file  Social History Narrative  . Not on file    Outpatient Encounter Medications as of 09/05/2017  Medication Sig  . albuterol (PROVENTIL HFA;VENTOLIN HFA) 108 (90 Base) MCG/ACT inhaler Inhale 2 puffs into the lungs every 4 (four) hours as needed  for wheezing or shortness of breath (cough, shortness of breath or wheezing.).  Marland Kitchen amoxicillin (AMOXIL) 875 MG tablet Take 1 tablet (875 mg total) by mouth 2 (two) times daily.  Marland Kitchen aspirin 81 MG tablet Take 81 mg by mouth daily.  . benzonatate (TESSALON) 100 MG capsule Take 1-2 capsules (100-200 mg total) by mouth 3 (three) times daily as needed.  . carvedilol (COREG) 6.25 MG tablet TAKE 1 TABLET BY MOUTH TWICE A DAY WITH FOOD  . dicyclomine (BENTYL) 20 MG tablet Take 20 mg 2 (two) times daily by mouth.  Arne Cleveland 5 MG TABS tablet TAKE 1 TABLET BY MOUTH TWICE A DAY  . fluorometholone (FML) 0.1 % ophthalmic suspension Place 1 drop into the right eye daily as needed (for eye redness and itching).   . fluticasone (FLONASE) 50 MCG/ACT nasal spray SPRAY ONCE INTO EACH NOSTRIL AS NEEDED FOR ALLERGIES OR RHINITIS DAILY  . insulin NPH Human (NOVOLIN N RELION) 100 UNIT/ML injection Inject 30 units into the skin twice daily.  . Insulin Pen Needle 31G X 8 MM MISC Use as directed with Lantus.  . insulin regular (NOVOLIN R RELION) 100 units/mL injection Inject 0.25 mLs (25 Units total) into the skin 3 (three) times daily before meals.  . isosorbide mononitrate (IMDUR) 30 MG 24 hr tablet TAKE 1 TABLET BY MOUTH EVERY DAY  . lisinopril  (PRINIVIL,ZESTRIL) 5 MG tablet Take 1 tablet (5 mg total) daily by mouth.  Marland Kitchen MAGNESIUM PO Take 1 tablet by mouth daily as needed (for leg cramping).   . nitroGLYCERIN (NITROSTAT) 0.4 MG SL tablet Place 1 tablet (0.4 mg total) under the tongue every 5 (five) minutes as needed for chest pain.  . rosuvastatin (CRESTOR) 10 MG tablet TAKE 1 TABLET BY MOUTH EVERY DAY  . tamsulosin (FLOMAX) 0.4 MG CAPS capsule Take 1 capsule (0.4 mg total) by mouth as needed. For difficulty urinating.  . traMADol (ULTRAM) 50 MG tablet Take 1 tablet (50 mg total) by mouth every 12 (twelve) hours as needed for severe pain.   No facility-administered encounter medications on file as of 09/05/2017.     Activities of Daily Living In your present state of health, do you have any difficulty performing the following activities: 09/05/2017  Hearing? N  Vision? N  Difficulty concentrating or making decisions? N  Walking or climbing stairs? N  Dressing or bathing? N  Doing errands, shopping? N  Preparing Food and eating ? N  Using the Toilet? N  In the past six months, have you accidently leaked urine? N  Do you have problems with loss of bowel control? N  Managing your Medications? N  Managing your Finances? N  Housekeeping or managing your Housekeeping? N  Some recent data might be hidden    Patient Care Team: [name], NP as PCP - General (Internal Medicine)   Assessment:   This is a routine wellness examination for [name].  Exercise Activities and Dietary recommendations Current Exercise Habits: Home exercise routine, Type of exercise: Other - see comments(golf 3x/wk), Time (Minutes): > 60, Frequency (Times/Week): 3, Weekly Exercise (Minutes/Week): 0, Intensity: Mild, Exercise limited by: None identified  Goals    . Increase physical activity     Starting 09/05/2017, I will continue to play golf for at least 4 hours 3 days per week and do yard work as needed.        Fall Risk Fall Risk  09/05/2017  08/24/2016 07/28/2016 03/10/2015 12/11/2014  Falls in the past year?  No No No No No  Comment - - Emmi Telephone Survey: data to providers prior to load - -  Depression Screen PHQ 2/9 Scores 09/05/2017 08/24/2016 03/10/2015 12/11/2014  PHQ - 2 Score 0 2 0 0  PHQ- 9 Score 0 2 - -    Cognitive Function MMSE - Mini Mental State Exam 09/05/2017 08/24/2016  Orientation to time 5 5  Orientation to Place 5 5  Registration 3 3  Attention/ Calculation 0 0  Recall 3 2  Recall-comments - pt was unable to recall 1 of 3 words  Language- name 2 objects 0 0  Language- repeat 1 1  Language- follow 3 step command 3 3  Language- read & follow direction 0 0  Write a sentence 0 0  Copy design 0 0  Total score 20 19       PLEASE NOTE: A Mini-Cog screen was completed. Maximum score is 20. A value of 0 denotes this part of [place] MMSE was not completed or the patient failed this part of the Mini-Cog screening.   Mini-Cog Screening Orientation to Time - Max 5 pts Orientation to Place - Max 5 pts Registration - Max 3 pts Recall - Max 3 pts Language Repeat - Max 1 pts Language Follow 3 Step Command - Max 3 pts   Immunization History  Administered Date(s) Administered  . Influenza Split 11/02/2013  . Influenza,inj,Quad PF,6+ Mos 11/08/2015, 10/24/2016  . Influenza-Unspecified 11/02/2012  . Pneumococcal Conjugate-13 12/11/2014  . Pneumococcal Polysaccharide-23 05/25/2005, 08/24/2016  . Tdap 01/16/2011  . Zoster 03/02/2013    Screening Tests Health Maintenance  Topic Date Due  . INFLUENZA VACCINE  04/03/2018 (Originally 08/02/2017)  . OPHTHALMOLOGY EXAM  02/17/2018  . FOOT EXAM  03/03/2018  . HEMOGLOBIN A1C  03/06/2018  . COLONOSCOPY  04/29/2018  . TETANUS/TDAP  01/15/2021  . Hepatitis C Screening  Completed  . PNA vac Low Risk Adult  Completed      Plan:   I have personally reviewed, addressed, and noted the following in the patient's chart:  A. Medical and social history B. Use of alcohol,  tobacco or illicit drugs  C. Current medications and supplements D. Functional ability and status E.  Nutritional status F.  Physical activity G. Advance directives H. List of other physicians I.  Hospitalizations, surgeries, and ER visits in previous 12 months J.  [place] to include hearing, vision, cognitive, depression L. Referrals and appointments - none  In addition, I have reviewed and discussed with patient certain preventive protocols, quality metrics, and best practice recommendations. A written personalized care plan for preventive services as well as general preventive health recommendations were provided to patient.  See attached scanned questionnaire for additional information.   Signed,   Lindell Noe, MHA, BS, LPN Health Coach

## 2017-09-06 NOTE — Progress Notes (Signed)
I reviewed health advisor's note, was available for consultation, and agree with documentation and plan.  

## 2017-09-07 ENCOUNTER — Encounter: Payer: Self-pay | Admitting: Primary Care

## 2017-09-07 ENCOUNTER — Ambulatory Visit (INDEPENDENT_AMBULATORY_CARE_PROVIDER_SITE_OTHER): Payer: Medicare Other | Admitting: Primary Care

## 2017-09-07 ENCOUNTER — Encounter: Payer: Medicare Other | Admitting: Primary Care

## 2017-09-07 VITALS — BP 118/66 | HR 64 | Temp 98.0°F | Ht 67.5 in | Wt 207.8 lb

## 2017-09-07 DIAGNOSIS — K7581 Nonalcoholic steatohepatitis (NASH): Secondary | ICD-10-CM

## 2017-09-07 DIAGNOSIS — N400 Enlarged prostate without lower urinary tract symptoms: Secondary | ICD-10-CM | POA: Diagnosis not present

## 2017-09-07 DIAGNOSIS — K589 Irritable bowel syndrome without diarrhea: Secondary | ICD-10-CM | POA: Diagnosis not present

## 2017-09-07 DIAGNOSIS — Z23 Encounter for immunization: Secondary | ICD-10-CM

## 2017-09-07 DIAGNOSIS — I480 Paroxysmal atrial fibrillation: Secondary | ICD-10-CM | POA: Diagnosis not present

## 2017-09-07 DIAGNOSIS — E1159 Type 2 diabetes mellitus with other circulatory complications: Secondary | ICD-10-CM | POA: Diagnosis not present

## 2017-09-07 DIAGNOSIS — I214 Non-ST elevation (NSTEMI) myocardial infarction: Secondary | ICD-10-CM | POA: Diagnosis not present

## 2017-09-07 DIAGNOSIS — I2510 Atherosclerotic heart disease of native coronary artery without angina pectoris: Secondary | ICD-10-CM

## 2017-09-07 DIAGNOSIS — E782 Mixed hyperlipidemia: Secondary | ICD-10-CM

## 2017-09-07 DIAGNOSIS — I10 Essential (primary) hypertension: Secondary | ICD-10-CM | POA: Diagnosis not present

## 2017-09-07 MED ORDER — INSULIN REGULAR HUMAN 100 UNIT/ML IJ SOLN: INTRAMUSCULAR | 11 refills | Status: DC

## 2017-09-07 MED ORDER — INSULIN NPH (HUMAN) (ISOPHANE) 100 UNIT/ML ~~LOC~~ SUSP: SUBCUTANEOUS | 11 refills | Status: DC

## 2017-09-07 MED ORDER — TAMSULOSIN HCL 0.4 MG PO CAPS: 0.4000 mg | ORAL_CAPSULE | ORAL | 0 refills | Status: DC | PRN

## 2017-09-07 NOTE — Assessment & Plan Note (Signed)
Following with cardiology. Continue current regimen. He is asymptomatic.

## 2017-09-07 NOTE — Assessment & Plan Note (Signed)
Uncontrolled with recent increase in A1C. Adjusted insulin regimen today which includes increasing NPH to 32 units BID and Novolin R 20 units BID with smaller meals, 25 units with larger meals or when having dessert.  Discussed to start checking his glucose 2-3 times daily including before meals if injecting regular acting insulin.   Follow up in 6 weeks for re-evaluation.

## 2017-09-07 NOTE — Assessment & Plan Note (Signed)
Recent LFT's unremarkable. Continue to monitor.

## 2017-09-07 NOTE — Progress Notes (Signed)
Subjective:    Patient ID: [name], male    DOB: February 25, 1945, 72 y.o.   MRN: [id number]  HPI  [name] is a 72 year old male who presents today for [place] Part 2. He saw our health advisor last week.  Immunizations: -Tetanus: Completed in 2013 -Influenza: Due today -Pneumonia: Completed last in 2018 -Shingles: Completed  Colonoscopy: Due in 2020 PSA: Normal in 2019 Hep C Screen: Completed   1) Type 2 Diabetes:  Current medications include: Novolin NPH 30 units BID, Novolin R 25 units TID. Meals. Over the last 2 months he's not taken the lunch time dose of his Novolin R as he often forgets. He's also been injecting 25 units of NPH.  He is checking his blood glucose 2 times daily and is getting readings of: AM fasting: 80-200 3 pm: 170  Last A1C: 10.3 recently Last Eye Exam: Completed in February 2019 Last Foot Exam: Completed in March 2019 Pneumonia Vaccination: Completed last in 2018 ACE/ARB: Lisinopril Statin: Crestor  Diet currently consists of:  Breakfast: Sausage/egg/cheese biscuit Lunch: Sandwich Dinner: Meat, vegetable, starch Snacks: Candy Desserts: Daily  Beverages: Diet soda, water  2) CAD/Hypertension/NSTEMI/Cardiomyopathy/Atrial Fibrillation: Currently managed on Eliquis 5 mg BID, carvedilol 6.25 mg BID, aspirin 81 mg, Imdur 30 mg, lisinopril 5 mg, Crestor 10 mg, nitroglycerin PRN.   He is currently following with cardiology and has an appointment next week. He denies chest pain, dizziness, shortness of breath.   BP Readings from Last 3 Encounters:  09/07/17 118/66  09/05/17 124/68  03/28/17 (!) 106/50     3) IBS: Diarrhea type. Currently managed on Bentyl 20 mg for which he takes three times weekly on average with improvement. He denies any rectal bleeding. He is due for repeat colonoscopy next year.   4) BPH: Previously managed on Flomax 0.4 mg. His last PSA was 1.43 on recent labs. He's not taken his Flomax in several months as he's had no  symptoms. He would like an updated prescription on file as his current bottle is expired. He denies urinary frequency, nocturia, urgency, dysuria, hematuria.     Review of Systems  Constitutional: Negative for fatigue.  Eyes: Negative for visual disturbance.  Respiratory: Negative for shortness of breath.   Cardiovascular: Negative for chest pain.  Gastrointestinal:       Intermittent IBS symptoms  Genitourinary: Negative for difficulty urinating, dysuria, frequency and hematuria.  Neurological: Negative for dizziness and headaches.       Past Medical History:  Diagnosis Date  . Arthritis    "fingers, shoulders" (08/03/2015)  . Atrial fibrillation (HCC)    Paroxysmal, rare episodes, sinus rhythm on flecainide, patient prefers not to take Coumadin  . Bradycardia    April, 2013  . Chest pain    Nuclear, 2006, no ischemia  . Chronic lower back pain    "all my life"  . Coronary artery disease    s/p staged cath 05/12/2014 and 5/11, DES x 2 to heavily calcified RCA, residual with 60% prox LAD, 70% D1  . Ejection fraction    EF 55-60%,  echo, January, 2011  . Elevated CPK    CPK elevated with normal MB and normal troponin the past  . Heart murmur   . History of echocardiogram    Echo 8/16: EF 55%, normal wall motion, grade 2 diastolic dysfunction, trivial MR, normal RV function, PASP 27 mmHg  . Hypertension   . IBS (irritable bowel syndrome)   . Myocardial infarction ([place]) 06/2014  .  Sinus drainage   . Type II diabetes mellitus ([place])      Social History   Socioeconomic History  . Marital status: Married    Spouse name: Not on file  . Number of children: Not on file  . Years of education: Not on file  . Highest education level: Not on file  Occupational History  . Not on file  Social Needs  . Financial resource strain: Not on file  . Food insecurity:    Worry: Not on file    Inability: Not on file  . Transportation needs:    Medical: Not on file    Non-medical: Not  on file  Tobacco Use  . Smoking status: Never Smoker  . Smokeless tobacco: Never Used  Substance and Sexual Activity  . Alcohol use: No  . Drug use: No  . Sexual activity: Not on file  Lifestyle  . Physical activity:    Days per week: Not on file    Minutes per session: Not on file  . Stress: Not on file  Relationships  . Social connections:    Talks on phone: Not on file    Gets together: Not on file    Attends religious service: Not on file    Active member of club or organization: Not on file    Attends meetings of clubs or organizations: Not on file    Relationship status: Not on file  . Intimate partner violence:    Fear of current or ex partner: Not on file    Emotionally abused: Not on file    Physically abused: Not on file    Forced sexual activity: Not on file  Other Topics Concern  . Not on file  Social History Narrative  . Not on file    Past Surgical History:  Procedure Laterality Date  . APPENDECTOMY    . CARDIAC CATHETERIZATION N/A 05/12/2014   Procedure: Left Heart Cath and Coronary Angiography;  Surgeon: Troy Sine, MD;  Location: [facility];  Service: Cardiovascular;  Laterality: N/A;  . CARDIAC CATHETERIZATION N/A 05/13/2014   Procedure: Coronary/Graft Atherectomy;  Surgeon: Troy Sine, MD;  Location: [facility];  Service: Cardiovascular;  Laterality: N/A;  . CARDIAC CATHETERIZATION Right 05/13/2014   Procedure: Temporary Pacemaker;  Surgeon: Troy Sine, MD;  Location: [facility];  Service: Cardiovascular;  Laterality: Right;  . CARDIAC CATHETERIZATION N/A 05/13/2014   Procedure: Coronary Stent Intervention;  Surgeon: Troy Sine, MD;  Location: [facility];  Service: Cardiovascular;  Laterality: N/A;  . CARDIAC CATHETERIZATION N/A 06/18/2014   Procedure: Left Heart Cath and Coronary Angiography;  Surgeon: Peter M Martinique, MD;  Location: [facility];  Service: Cardiovascular;  Laterality: N/A;  . CARDIAC  CATHETERIZATION N/A 08/03/2015   Procedure: Left Heart Cath and Cors/Grafts Angiography;  Surgeon: Nelva Bush, MD;  Location: [facility];  Service: Cardiovascular;  Laterality: N/A;  . CARDIOVASCULAR STRESS TEST  05/06/2014  . CATARACT EXTRACTION W/ INTRAOCULAR LENS IMPLANT Left   . CORONARY ARTERY BYPASS GRAFT N/A 06/22/2014   Procedure: CORONARY ARTERY BYPASS GRAFTING (CABG) x five, using left internal mammary artery, and right leg greater saphenous vein harvested endoscopically;  Surgeon: Melrose Nakayama, MD;  Location: [facility];  Service: Open Heart Surgery;  Laterality: N/A;  . LAPAROSCOPIC CHOLECYSTECTOMY    . MAZE N/A 06/22/2014   Procedure: MAZE;  Surgeon: Melrose Nakayama, MD;  Location: [facility];  Service:  Open Heart Surgery;  Laterality: N/A;  . TEE WITHOUT CARDIOVERSION N/A 06/22/2014   Procedure: TRANSESOPHAGEAL ECHOCARDIOGRAM (TEE);  Surgeon: Melrose Nakayama, MD;  Location: [facility];  Service: Open Heart Surgery;  Laterality: N/A;    Family History  Problem Relation Age of Onset  . Alzheimer's disease Mother   . Emphysema Father   . Cancer Brother   . Arrhythmia Sister   . Heart attack Sister   . Heart disease Sister   . Hyperlipidemia Sister   . Hypertension Sister     Allergies  Allergen Reactions  . Lipitor [Atorvastatin] Other (See Comments)    Myopathy Spring 2006  . Pravastatin Other (See Comments)    LEG CRAMPS  . Simvastatin Other (See Comments)    LEG CRAMPS    Current Outpatient Medications on File Prior to Visit  Medication Sig Dispense Refill  . albuterol (PROVENTIL HFA;VENTOLIN HFA) 108 (90 Base) MCG/ACT inhaler Inhale 2 puffs into the lungs every 4 (four) hours as needed for wheezing or shortness of breath (cough, shortness of breath or wheezing.). 1 Inhaler 1  . aspirin 81 MG tablet Take 81 mg by mouth daily.    . carvedilol (COREG) 6.25 MG tablet TAKE 1 TABLET BY MOUTH TWICE A DAY WITH FOOD 180 tablet 3  . dicyclomine (BENTYL) 20 MG  tablet Take 20 mg 2 (two) times daily by mouth.    Arne Cleveland 5 MG TABS tablet TAKE 1 TABLET BY MOUTH TWICE A DAY 60 tablet 5  . fluorometholone (FML) 0.1 % ophthalmic suspension Place 1 drop into the right eye daily as needed (for eye redness and itching).     . fluticasone (FLONASE) 50 MCG/ACT nasal spray SPRAY ONCE INTO EACH NOSTRIL AS NEEDED FOR ALLERGIES OR RHINITIS DAILY 16 g 2  . Insulin Pen Needle 31G X 8 MM MISC Use as directed with Lantus. 100 each 5  . isosorbide mononitrate (IMDUR) 30 MG 24 hr tablet TAKE 1 TABLET BY MOUTH EVERY DAY 90 tablet 3  . lisinopril (PRINIVIL,ZESTRIL) 5 MG tablet Take 1 tablet (5 mg total) daily by mouth. 90 tablet 3  . MAGNESIUM PO Take 1 tablet by mouth daily as needed (for leg cramping).     . rosuvastatin (CRESTOR) 10 MG tablet TAKE 1 TABLET BY MOUTH EVERY DAY 90 tablet 3  . nitroGLYCERIN (NITROSTAT) 0.4 MG SL tablet Place 1 tablet (0.4 mg total) under the tongue every 5 (five) minutes as needed for chest pain. (Patient not taking: Reported on 09/07/2017) 25 tablet 3  . traMADol (ULTRAM) 50 MG tablet Take 1 tablet (50 mg total) by mouth every 12 (twelve) hours as needed for severe pain. (Patient not taking: Reported on 09/07/2017) 14 tablet 0   No current facility-administered medications on file prior to visit.     BP 118/66   Pulse 64   Temp 98 F (36.7 C) (Oral)   Ht 5' 7.5" (1.715 m)   Wt 207 lb 12 oz (94.2 kg)   SpO2 98%   BMI 32.06 kg/m    Objective:   Physical Exam  Constitutional: He is oriented to person, place, and time. He appears well-nourished.  HENT:  Mouth/Throat: No oropharyngeal exudate.  Eyes: Pupils are equal, round, and reactive to light. EOM are normal.  Neck: Neck supple. No thyromegaly present.  Cardiovascular: Normal rate and regular rhythm.  Respiratory: Effort normal and breath sounds normal.  GI: Soft. Bowel sounds are normal. There is no tenderness.  Musculoskeletal: Normal range of  motion.  Neurological: He is  alert and oriented to person, place, and time.  Skin: Skin is warm and dry.  Psychiatric: He has a normal mood and affect.           Assessment & Plan:

## 2017-09-07 NOTE — Assessment & Plan Note (Signed)
Rate and rhythm regular today, following with cardiology. Continue Eliquis and beta blocker.

## 2017-09-07 NOTE — Assessment & Plan Note (Signed)
Stable in the office today, continue current regimen. 

## 2017-09-07 NOTE — Patient Instructions (Signed)
We've changed your insulin.  Relion N (NPH): this is the medium lasting insulin. Inject 32 units twice daily.  Relion R (Regular): this is the fast acting insulin. Inject 20 units twice daily with smaller meals. OR Inject 25 units twice daily with larger meals or if you have dessert.  Start checking your blood sugars three times daily: Before breakfast, 2 hours after lunch, before dinner.  Follow up with your cardiologist as scheduled.  Schedule a follow up visit with me in 6 weeks for diabetes check. Please notify me if you continue to get readings higher than 200 after 2 weeks of this regimen.  It was a pleasure to see you today!   Diabetes Mellitus and Nutrition When you have diabetes (diabetes mellitus), it is very important to have healthy eating habits because your blood sugar (glucose) levels are greatly affected by what you eat and drink. Eating healthy foods in the appropriate amounts, at about the same times every day, can help you:  Control your blood glucose.  Lower your risk of heart disease.  Improve your blood pressure.  Reach or maintain a healthy weight.  Every person with diabetes is different, and each person has different needs for a meal plan. Your health care provider may recommend that you work with a diet and nutrition specialist (dietitian) to make a meal plan that is best for you. Your meal plan may vary depending on factors such as:  The calories you need.  The medicines you take.  Your weight.  Your blood glucose, blood pressure, and cholesterol levels.  Your activity level.  Other health conditions you have, such as heart or kidney disease.  How do carbohydrates affect me? Carbohydrates affect your blood glucose level more than any other type of food. Eating carbohydrates naturally increases the amount of glucose in your blood. Carbohydrate counting is a method for keeping track of how many carbohydrates you eat. Counting carbohydrates is  important to keep your blood glucose at a healthy level, especially if you use insulin or take certain oral diabetes medicines. It is important to know how many carbohydrates you can safely have in each meal. This is different for every person. Your dietitian can help you calculate how many carbohydrates you should have at each meal and for snack. Foods that contain carbohydrates include:  Bread, cereal, rice, pasta, and crackers.  Potatoes and corn.  Peas, beans, and lentils.  Milk and yogurt.  Fruit and juice.  Desserts, such as cakes, cookies, ice cream, and candy.  How does alcohol affect me? Alcohol can cause a sudden decrease in blood glucose (hypoglycemia), especially if you use insulin or take certain oral diabetes medicines. Hypoglycemia can be a life-threatening condition. Symptoms of hypoglycemia (sleepiness, dizziness, and confusion) are similar to symptoms of having too much alcohol. If your health care provider says that alcohol is safe for you, follow these guidelines:  Limit alcohol intake to no more than 1 drink per day for nonpregnant women and 2 drinks per day for men. One drink equals 12 oz of beer, 5 oz of wine, or 1 oz of hard liquor.  Do not drink on an empty stomach.  Keep yourself hydrated with water, diet soda, or unsweetened iced tea.  Keep in mind that regular soda, juice, and other mixers may contain a lot of sugar and must be counted as carbohydrates.  What are tips for following this plan? Reading food labels  Start by checking the serving size on the label. The  amount of calories, carbohydrates, fats, and other nutrients listed on the label are based on one serving of the food. Many foods contain more than one serving per package.  Check the total grams (g) of carbohydrates in one serving. You can calculate the number of servings of carbohydrates in one serving by dividing the total carbohydrates by 15. For example, if a food has 30 g of total  carbohydrates, it would be equal to 2 servings of carbohydrates.  Check the number of grams (g) of saturated and trans fats in one serving. Choose foods that have low or no amount of these fats.  Check the number of milligrams (mg) of sodium in one serving. Most people should limit total sodium intake to less than 2,300 mg per day.  Always check the nutrition information of foods labeled as "low-fat" or "nonfat". These foods may be higher in added sugar or refined carbohydrates and should be avoided.  Talk to your dietitian to identify your daily goals for nutrients listed on the label. Shopping  Avoid buying canned, premade, or processed foods. These foods tend to be high in fat, sodium, and added sugar.  Shop around the outside edge of the grocery store. This includes fresh fruits and vegetables, bulk grains, fresh meats, and fresh dairy. Cooking  Use low-heat cooking methods, such as baking, instead of high-heat cooking methods like deep frying.  Cook using healthy oils, such as olive, canola, or sunflower oil.  Avoid cooking with butter, cream, or high-fat meats. Meal planning  Eat meals and snacks regularly, preferably at the same times every day. Avoid going long periods of time without eating.  Eat foods high in fiber, such as fresh fruits, vegetables, beans, and whole grains. Talk to your dietitian about how many servings of carbohydrates you can eat at each meal.  Eat 4-6 ounces of lean protein each day, such as lean meat, chicken, fish, eggs, or tofu. 1 ounce is equal to 1 ounce of meat, chicken, or fish, 1 egg, or 1/4 cup of tofu.  Eat some foods each day that contain healthy fats, such as avocado, nuts, seeds, and fish. Lifestyle   Check your blood glucose regularly.  Exercise at least 30 minutes 5 or more days each week, or as told by your health care provider.  Take medicines as told by your health care provider.  Do not use any products that contain nicotine or  tobacco, such as cigarettes and e-cigarettes. If you need help quitting, ask your health care provider.  Work with a Social worker or diabetes educator to identify strategies to manage stress and any emotional and social challenges. What are some questions to ask my health care provider?  Do I need to meet with a diabetes educator?  Do I need to meet with a dietitian?  What number can I call if I have questions?  When are the best times to check my blood glucose? Where to find more information:  American Diabetes Association: diabetes.org/food-and-fitness/food  Academy of Nutrition and Dietetics: PokerClues.dk  [employer] of Diabetes and Digestive and Kidney Diseases (NIH): ContactWire.be Summary  A healthy meal plan will help you control your blood glucose and maintain a healthy lifestyle.  Working with a diet and nutrition specialist (dietitian) can help you make a meal plan that is best for you.  Keep in mind that carbohydrates and alcohol have immediate effects on your blood glucose levels. It is important to count carbohydrates and to use alcohol carefully. This information is not  intended to replace advice given to you by your health care provider. Make sure you discuss any questions you have with your health care provider. Document Released: 09/15/2004 Document Revised: 01/24/2016 Document Reviewed: 01/24/2016 Elsevier Interactive Patient Education  [employer].

## 2017-09-07 NOTE — Assessment & Plan Note (Signed)
Asymptomatic, no use of nitroglycerin. Discussed importance of strict diabetes control to prevent future complications.  Following with cardiology.

## 2017-09-07 NOTE — Assessment & Plan Note (Signed)
Using Bentyl 20 mg three times weekly on average with improvement in symptoms. Continue same.

## 2017-09-07 NOTE — Assessment & Plan Note (Signed)
Recent lipids stable. Continue Crestor.

## 2017-09-07 NOTE — Assessment & Plan Note (Signed)
Asymptomatic and no recent use of Flomax. Updated Rx to have on file if needed. PSA today of 1.43.

## 2017-10-22 ENCOUNTER — Ambulatory Visit (INDEPENDENT_AMBULATORY_CARE_PROVIDER_SITE_OTHER): Payer: Medicare Other | Admitting: Primary Care

## 2017-10-22 ENCOUNTER — Encounter: Payer: Self-pay | Admitting: Primary Care

## 2017-10-22 VITALS — BP 124/70 | HR 69 | Temp 97.7°F | Ht 67.5 in | Wt 211.8 lb

## 2017-10-22 DIAGNOSIS — E1159 Type 2 diabetes mellitus with other circulatory complications: Secondary | ICD-10-CM

## 2017-10-22 DIAGNOSIS — I2510 Atherosclerotic heart disease of native coronary artery without angina pectoris: Secondary | ICD-10-CM | POA: Diagnosis not present

## 2017-10-22 LAB — POCT GLYCOSYLATED HEMOGLOBIN (HGB A1C): HEMOGLOBIN A1C: 9.8 % — AB (ref 4.0–5.6)

## 2017-10-22 MED ORDER — INSULIN REGULAR HUMAN 100 UNIT/ML IJ SOLN: INTRAMUSCULAR | 11 refills | Status: DC

## 2017-10-22 MED ORDER — INSULIN NPH (HUMAN) (ISOPHANE) 100 UNIT/ML ~~LOC~~ SUSP: SUBCUTANEOUS | 11 refills | Status: DC

## 2017-10-22 NOTE — Progress Notes (Signed)
Subjective:    Patient ID: [name], male    DOB: [birth year], 72 y.o.   MRN: [id number]  HPI  [name] is a 72 year old male who presents today for follow up of diabetes.  Current medications include: Relion N 35 units BID, Relion R 20-25 units BID with meals. He's injecting 35 units of Relion N twice daily and Relion R 30 units twice daily.  He is checking his blood glucose 2 times daily and is getting readings of: AM fasting: 160-250 Before dinner: 125  When playing golf, after around the 9th hole, he can feel his glucose dropping (without checking), so he will eat candy.   Last A1C: 10.3 in September 2019 Last Eye Exam: February 2019 Last Foot Exam: March 2019 Pneumonia Vaccination: Completed in 2018 ACE/ARB: lisinopril  Statin: rosuvastatin   Diet currently consists of:  Breakfast: Frozen sausage/egg/cheese biscuit Lunch: Sandwich  Dinner: Meat, starch, beans Snacks: Candy, chips Desserts: Daily  Beverages: Diet soda, water, coffee   Exercise: He is playing golf three times weekly    Review of Systems  Eyes: Negative for visual disturbance.  Respiratory: Negative for shortness of breath.   Cardiovascular: Negative for chest pain.  Neurological: Negative for dizziness and headaches.       Past Medical History:  Diagnosis Date  . Arthritis    "fingers, shoulders" (08/03/2015)  . Atrial fibrillation (HCC)    Paroxysmal, rare episodes, sinus rhythm on flecainide, patient prefers not to take Coumadin  . Bradycardia    April, 2013  . Chest pain    Nuclear, 2006, no ischemia  . Chronic lower back pain    "all my life"  . Coronary artery disease    s/p staged cath 05/12/2014 and 5/11, DES x 2 to heavily calcified RCA, residual with 60% prox LAD, 70% D1  . Ejection fraction    EF 55-60%,  echo, January, 2011  . Elevated CPK    CPK elevated with normal MB and normal troponin the past  . Heart murmur   . History of echocardiogram    Echo 8/16: EF 55%,  normal wall motion, grade 2 diastolic dysfunction, trivial MR, normal RV function, PASP 27 mmHg  . Hypertension   . IBS (irritable bowel syndrome)   . Myocardial infarction ([place]) 06/2014  . Sinus drainage   . Type II diabetes mellitus ([place])      Social History   Socioeconomic History  . Marital status: Married    Spouse name: Not on file  . Number of children: Not on file  . Years of education: Not on file  . Highest education level: Not on file  Occupational History  . Not on file  Social Needs  . Financial resource strain: Not on file  . Food insecurity:    Worry: Not on file    Inability: Not on file  . Transportation needs:    Medical: Not on file    Non-medical: Not on file  Tobacco Use  . Smoking status: Never Smoker  . Smokeless tobacco: Never Used  Substance and Sexual Activity  . Alcohol use: No  . Drug use: No  . Sexual activity: Not on file  Lifestyle  . Physical activity:    Days per week: Not on file    Minutes per session: Not on file  . Stress: Not on file  Relationships  . Social connections:    Talks on phone: Not on file    Gets together: Not  on file    Attends religious service: Not on file    Active member of club or organization: Not on file    Attends meetings of clubs or organizations: Not on file    Relationship status: Not on file  . Intimate partner violence:    Fear of current or ex partner: Not on file    Emotionally abused: Not on file    Physically abused: Not on file    Forced sexual activity: Not on file  Other Topics Concern  . Not on file  Social History Narrative  . Not on file    Past Surgical History:  Procedure Laterality Date  . APPENDECTOMY    . CARDIAC CATHETERIZATION N/A 05/12/2014   Procedure: Left Heart Cath and Coronary Angiography;  Surgeon: Troy Sine, MD;  Location: [facility];  Service: Cardiovascular;  Laterality: N/A;  . CARDIAC CATHETERIZATION N/A 05/13/2014   Procedure: Coronary/Graft  Atherectomy;  Surgeon: Troy Sine, MD;  Location: [facility];  Service: Cardiovascular;  Laterality: N/A;  . CARDIAC CATHETERIZATION Right 05/13/2014   Procedure: Temporary Pacemaker;  Surgeon: Troy Sine, MD;  Location: [facility];  Service: Cardiovascular;  Laterality: Right;  . CARDIAC CATHETERIZATION N/A 05/13/2014   Procedure: Coronary Stent Intervention;  Surgeon: Troy Sine, MD;  Location: [facility];  Service: Cardiovascular;  Laterality: N/A;  . CARDIAC CATHETERIZATION N/A 06/18/2014   Procedure: Left Heart Cath and Coronary Angiography;  Surgeon: Peter M Martinique, MD;  Location: [facility];  Service: Cardiovascular;  Laterality: N/A;  . CARDIAC CATHETERIZATION N/A 08/03/2015   Procedure: Left Heart Cath and Cors/Grafts Angiography;  Surgeon: Nelva Bush, MD;  Location: [facility];  Service: Cardiovascular;  Laterality: N/A;  . CARDIOVASCULAR STRESS TEST  05/06/2014  . CATARACT EXTRACTION W/ INTRAOCULAR LENS IMPLANT Left   . CORONARY ARTERY BYPASS GRAFT N/A 06/22/2014   Procedure: CORONARY ARTERY BYPASS GRAFTING (CABG) x five, using left internal mammary artery, and right leg greater saphenous vein harvested endoscopically;  Surgeon: Melrose Nakayama, MD;  Location: [facility];  Service: Open Heart Surgery;  Laterality: N/A;  . LAPAROSCOPIC CHOLECYSTECTOMY    . MAZE N/A 06/22/2014   Procedure: MAZE;  Surgeon: Melrose Nakayama, MD;  Location: [place] [facility];  Service: Open Heart Surgery;  Laterality: N/A;  . TEE WITHOUT CARDIOVERSION N/A 06/22/2014   Procedure: TRANSESOPHAGEAL ECHOCARDIOGRAM (TEE);  Surgeon: Melrose Nakayama, MD;  Location: [facility];  Service: Open Heart Surgery;  Laterality: N/A;    Family History  Problem Relation Age of Onset  . Alzheimer's disease Mother   . Emphysema Father   . Cancer Brother   . Arrhythmia Sister   . Heart attack Sister   . Heart disease Sister   . Hyperlipidemia Sister   . Hypertension Sister      Allergies  Allergen Reactions  . Lipitor [Atorvastatin] Other (See Comments)    Myopathy Spring 2006  . Pravastatin Other (See Comments)    LEG CRAMPS  . Simvastatin Other (See Comments)    LEG CRAMPS    Current Outpatient Medications on File Prior to Visit  Medication Sig Dispense Refill  . albuterol (PROVENTIL HFA;VENTOLIN HFA) 108 (90 Base) MCG/ACT inhaler Inhale 2 puffs into the lungs every 4 (four) hours as needed for wheezing or shortness of breath (cough, shortness of breath or wheezing.). 1 Inhaler 1  . aspirin 81 MG tablet Take 81 mg by mouth daily.    Marland Kitchen  carvedilol (COREG) 6.25 MG tablet TAKE 1 TABLET BY MOUTH TWICE A DAY WITH FOOD 180 tablet 3  . dicyclomine (BENTYL) 20 MG tablet Take 20 mg 2 (two) times daily by mouth.    Arne Cleveland 5 MG TABS tablet TAKE 1 TABLET BY MOUTH TWICE A DAY 60 tablet 5  . fluorometholone (FML) 0.1 % ophthalmic suspension Place 1 drop into the right eye daily as needed (for eye redness and itching).     . fluticasone (FLONASE) 50 MCG/ACT nasal spray SPRAY ONCE INTO EACH NOSTRIL AS NEEDED FOR ALLERGIES OR RHINITIS DAILY 16 g 2  . insulin NPH Human (NOVOLIN N RELION) 100 UNIT/ML injection Inject 32 units into the skin twice daily. 10 mL 11  . Insulin Pen Needle 31G X 8 MM MISC Use as directed with Lantus. 100 each 5  . insulin regular (NOVOLIN R RELION) 100 units/mL injection Inject 20 units into the skin twice daily with a smaller meal. Inject 25 units into the skin twice daily with a larger meal or if you have dessert. 10 mL 11  . isosorbide mononitrate (IMDUR) 30 MG 24 hr tablet TAKE 1 TABLET BY MOUTH EVERY DAY 90 tablet 3  . lisinopril (PRINIVIL,ZESTRIL) 5 MG tablet Take 1 tablet (5 mg total) daily by mouth. 90 tablet 3  . MAGNESIUM PO Take 1 tablet by mouth daily as needed (for leg cramping).     . nitroGLYCERIN (NITROSTAT) 0.4 MG SL tablet Place 1 tablet (0.4 mg total) under the tongue every 5 (five) minutes as needed for chest pain. 25 tablet 3   . rosuvastatin (CRESTOR) 10 MG tablet TAKE 1 TABLET BY MOUTH EVERY DAY 90 tablet 3  . tamsulosin (FLOMAX) 0.4 MG CAPS capsule Take 1 capsule (0.4 mg total) by mouth as needed. For difficulty urinating. 90 capsule 0  . traMADol (ULTRAM) 50 MG tablet Take 1 tablet (50 mg total) by mouth every 12 (twelve) hours as needed for severe pain. (Patient not taking: Reported on 10/22/2017) 14 tablet 0   No current facility-administered medications on file prior to visit.     BP 124/70   Pulse 69   Temp 97.7 F (36.5 C) (Oral)   Ht 5' 7.5" (1.715 m)   Wt 211 lb 12 oz (96 kg)   SpO2 99%   BMI 32.68 kg/m    Objective:   Physical Exam  Constitutional: He appears well-nourished.  Neck: Neck supple.  Cardiovascular: Normal rate and regular rhythm.  Respiratory: Effort normal and breath sounds normal.  Skin: Skin is warm and dry.  Psychiatric: He has a normal mood and affect.           Assessment & Plan:

## 2017-10-22 NOTE — Assessment & Plan Note (Signed)
Improving but still above goal. Suspect diet and lack of regular physical exercise is contributing.   Recommended endocrinology evaluation for which he declines. Continue Novolin N 35 units in the morning, increase to 40 units in the evening. Continue Novolin R 30 units twice daily, increase to 32 units with larger meals/dessert.   Will have him back in the office in 6 weeks with glucose logs.

## 2017-10-22 NOTE — Patient Instructions (Addendum)
Continue your Relion N 35 units in the morning, increase your Relion N to 40 units at bedtime.  Continue Relion R twice daily. Inject 30 units with smaller meal, 32 units with a larger meal or if you have desert.   Check your blood sugar at least three times daily: Before all meals Bedtime  You must check your blood sugar before injecting the R insulin. Check your blood sugar when you feel bad when playing golf.   Schedule a follow up visit in 6 weeks for diabetes follow up.  It was a pleasure to see you today!   Diabetes Mellitus and Nutrition When you have diabetes (diabetes mellitus), it is very important to have healthy eating habits because your blood sugar (glucose) levels are greatly affected by what you eat and drink. Eating healthy foods in the appropriate amounts, at about the same times every day, can help you:  Control your blood glucose.  Lower your risk of heart disease.  Improve your blood pressure.  Reach or maintain a healthy weight.  Every person with diabetes is different, and each person has different needs for a meal plan. Your health care provider may recommend that you work with a diet and nutrition specialist (dietitian) to make a meal plan that is best for you. Your meal plan may vary depending on factors such as:  The calories you need.  The medicines you take.  Your weight.  Your blood glucose, blood pressure, and cholesterol levels.  Your activity level.  Other health conditions you have, such as heart or kidney disease.  How do carbohydrates affect me? Carbohydrates affect your blood glucose level more than any other type of food. Eating carbohydrates naturally increases the amount of glucose in your blood. Carbohydrate counting is a method for keeping track of how many carbohydrates you eat. Counting carbohydrates is important to keep your blood glucose at a healthy level, especially if you use insulin or take certain oral diabetes medicines. It  is important to know how many carbohydrates you can safely have in each meal. This is different for every person. Your dietitian can help you calculate how many carbohydrates you should have at each meal and for snack. Foods that contain carbohydrates include:  Bread, cereal, rice, pasta, and crackers.  Potatoes and corn.  Peas, beans, and lentils.  Milk and yogurt.  Fruit and juice.  Desserts, such as cakes, cookies, ice cream, and candy.  How does alcohol affect me? Alcohol can cause a sudden decrease in blood glucose (hypoglycemia), especially if you use insulin or take certain oral diabetes medicines. Hypoglycemia can be a life-threatening condition. Symptoms of hypoglycemia (sleepiness, dizziness, and confusion) are similar to symptoms of having too much alcohol. If your health care provider says that alcohol is safe for you, follow these guidelines:  Limit alcohol intake to no more than 1 drink per day for nonpregnant women and 2 drinks per day for men. One drink equals 12 oz of beer, 5 oz of wine, or 1 oz of hard liquor.  Do not drink on an empty stomach.  Keep yourself hydrated with water, diet soda, or unsweetened iced tea.  Keep in mind that regular soda, juice, and other mixers may contain a lot of sugar and must be counted as carbohydrates.  What are tips for following this plan? Reading food labels  Start by checking the serving size on the label. The amount of calories, carbohydrates, fats, and other nutrients listed on the label are based on  one serving of the food. Many foods contain more than one serving per package.  Check the total grams (g) of carbohydrates in one serving. You can calculate the number of servings of carbohydrates in one serving by dividing the total carbohydrates by 15. For example, if a food has 30 g of total carbohydrates, it would be equal to 2 servings of carbohydrates.  Check the number of grams (g) of saturated and trans fats in one  serving. Choose foods that have low or no amount of these fats.  Check the number of milligrams (mg) of sodium in one serving. Most people should limit total sodium intake to less than 2,300 mg per day.  Always check the nutrition information of foods labeled as "low-fat" or "nonfat". These foods may be higher in added sugar or refined carbohydrates and should be avoided.  Talk to your dietitian to identify your daily goals for nutrients listed on the label. Shopping  Avoid buying canned, premade, or processed foods. These foods tend to be high in fat, sodium, and added sugar.  Shop around the outside edge of the grocery store. This includes fresh fruits and vegetables, bulk grains, fresh meats, and fresh dairy. Cooking  Use low-heat cooking methods, such as baking, instead of high-heat cooking methods like deep frying.  Cook using healthy oils, such as olive, canola, or sunflower oil.  Avoid cooking with butter, cream, or high-fat meats. Meal planning  Eat meals and snacks regularly, preferably at the same times every day. Avoid going long periods of time without eating.  Eat foods high in fiber, such as fresh fruits, vegetables, beans, and whole grains. Talk to your dietitian about how many servings of carbohydrates you can eat at each meal.  Eat 4-6 ounces of lean protein each day, such as lean meat, chicken, fish, eggs, or tofu. 1 ounce is equal to 1 ounce of meat, chicken, or fish, 1 egg, or 1/4 cup of tofu.  Eat some foods each day that contain healthy fats, such as avocado, nuts, seeds, and fish. Lifestyle   Check your blood glucose regularly.  Exercise at least 30 minutes 5 or more days each week, or as told by your health care provider.  Take medicines as told by your health care provider.  Do not use any products that contain nicotine or tobacco, such as cigarettes and e-cigarettes. If you need help quitting, ask your health care provider.  Work with a Social worker or  diabetes educator to identify strategies to manage stress and any emotional and social challenges. What are some questions to ask my health care provider?  Do I need to meet with a diabetes educator?  Do I need to meet with a dietitian?  What number can I call if I have questions?  When are the best times to check my blood glucose? Where to find more information:  American Diabetes Association: diabetes.org/food-and-fitness/food  Academy of Nutrition and Dietetics: PokerClues.dk  [employer] of Diabetes and Digestive and Kidney Diseases (NIH): ContactWire.be Summary  A healthy meal plan will help you control your blood glucose and maintain a healthy lifestyle.  Working with a diet and nutrition specialist (dietitian) can help you make a meal plan that is best for you.  Keep in mind that carbohydrates and alcohol have immediate effects on your blood glucose levels. It is important to count carbohydrates and to use alcohol carefully. This information is not intended to replace advice given to you by your health care provider. Make sure you  discuss any questions you have with your health care provider. Document Released: 09/15/2004 Document Revised: 01/24/2016 Document Reviewed: 01/24/2016 Elsevier Interactive Patient Education  [employer].

## 2017-10-25 ENCOUNTER — Other Ambulatory Visit: Payer: Self-pay | Admitting: Interventional Cardiology

## 2017-11-07 ENCOUNTER — Other Ambulatory Visit: Payer: Self-pay | Admitting: Interventional Cardiology

## 2017-11-08 ENCOUNTER — Other Ambulatory Visit: Payer: Self-pay | Admitting: Interventional Cardiology

## 2017-12-03 ENCOUNTER — Other Ambulatory Visit: Payer: Self-pay | Admitting: Primary Care

## 2017-12-03 ENCOUNTER — Ambulatory Visit (INDEPENDENT_AMBULATORY_CARE_PROVIDER_SITE_OTHER): Payer: Medicare Other | Admitting: Primary Care

## 2017-12-03 ENCOUNTER — Encounter: Payer: Self-pay | Admitting: Primary Care

## 2017-12-03 DIAGNOSIS — I2510 Atherosclerotic heart disease of native coronary artery without angina pectoris: Secondary | ICD-10-CM | POA: Diagnosis not present

## 2017-12-03 DIAGNOSIS — E1159 Type 2 diabetes mellitus with other circulatory complications: Secondary | ICD-10-CM

## 2017-12-03 DIAGNOSIS — N400 Enlarged prostate without lower urinary tract symptoms: Secondary | ICD-10-CM

## 2017-12-03 MED ORDER — INSULIN NPH (HUMAN) (ISOPHANE) 100 UNIT/ML ~~LOC~~ SUSP: SUBCUTANEOUS | 11 refills | Status: DC

## 2017-12-03 MED ORDER — INSULIN REGULAR HUMAN 100 UNIT/ML IJ SOLN: INTRAMUSCULAR | 11 refills | Status: DC

## 2017-12-03 NOTE — Patient Instructions (Signed)
We've increased your Relion NPH to 38 units in the morning, continue 40 units in the evening.  We've reduced your Relion R (fast acting) to 25 units in the morning and continued 30-32 units in the evening.   Continue to check your blood sugars and rotate times:  Before any meal, 2 hours after any meal, bedtime  Please call me if you notice readings continue to drop below 80 or remain above 200.  Please schedule a follow up appointment in 1 month.  It was a pleasure to see you today!

## 2017-12-03 NOTE — Progress Notes (Signed)
Subjective:    Patient ID: [name], male    DOB: [birth year], 72 y.o.   MRN: [id number]  HPI  [name] is a 72 year old male who presents today for follow up of type 2 diabetes.  Current medications include: Novloin NPH 35 units every morning and 40 units every evening; Novolin R 30-32 units BID.  He is checking his blood glucose two times daily and is getting readings of:  AM fasting: 125-220 6 PM: 125-220  Lowest reading: 64, didn't eat much Highest reading: 250  He will notice that his glucose will "drop" into the 60-70 range on occasion when he plays 9 holes of golf. If this occurs he will eat candy with improvement. He plays golf around 10-11 am three times weekly and does eat breakfast beforehand.   Last A1C: 9.8 in October, 10.3 in September 2019 Last Eye Exam: Due in February 2020 Last Foot Exam: Due in March 2020 Pneumonia Vaccination: Completed in 2018 ACE/ARB: Lisinopril  Statin: Crestor  Diet currently consists of:  Breakfast: Fast food Lunch: Sandwich Dinner: Meat, vegetable, starch Snacks: Occasionally  Desserts: Candy  Beverages: Coffee, diet soda, sweet tea, water  Exercise: Playing golf three days weekly    Review of Systems  Respiratory: Negative for shortness of breath.   Cardiovascular: Negative for chest pain.  Neurological: Negative for dizziness and numbness.       Past Medical History:  Diagnosis Date  . Arthritis    "fingers, shoulders" (08/03/2015)  . Atrial fibrillation (HCC)    Paroxysmal, rare episodes, sinus rhythm on flecainide, patient prefers not to take Coumadin  . Bradycardia    April, 2013  . Chest pain    Nuclear, 2006, no ischemia  . Chronic lower back pain    "all my life"  . Coronary artery disease    s/p staged cath 05/12/2014 and 5/11, DES x 2 to heavily calcified RCA, residual with 60% prox LAD, 70% D1  . Ejection fraction    EF 55-60%,  echo, January, 2011  . Elevated CPK    CPK elevated with normal MB and  normal troponin the past  . Heart murmur   . History of echocardiogram    Echo 8/16: EF 55%, normal wall motion, grade 2 diastolic dysfunction, trivial MR, normal RV function, PASP 27 mmHg  . Hypertension   . IBS (irritable bowel syndrome)   . Myocardial infarction ([place]) 06/2014  . Sinus drainage   . Type II diabetes mellitus ([place])      Social History   Socioeconomic History  . Marital status: Married    Spouse name: Not on file  . Number of children: Not on file  . Years of education: Not on file  . Highest education level: Not on file  Occupational History  . Not on file  Social Needs  . Financial resource strain: Not on file  . Food insecurity:    Worry: Not on file    Inability: Not on file  . Transportation needs:    Medical: Not on file    Non-medical: Not on file  Tobacco Use  . Smoking status: Never Smoker  . Smokeless tobacco: Never Used  Substance and Sexual Activity  . Alcohol use: No  . Drug use: No  . Sexual activity: Not on file  Lifestyle  . Physical activity:    Days per week: Not on file    Minutes per session: Not on file  . Stress: Not on file  Relationships  . Social connections:    Talks on phone: Not on file    Gets together: Not on file    Attends religious service: Not on file    Active member of club or organization: Not on file    Attends meetings of clubs or organizations: Not on file    Relationship status: Not on file  . Intimate partner violence:    Fear of current or ex partner: Not on file    Emotionally abused: Not on file    Physically abused: Not on file    Forced sexual activity: Not on file  Other Topics Concern  . Not on file  Social History Narrative  . Not on file    Past Surgical History:  Procedure Laterality Date  . APPENDECTOMY    . CARDIAC CATHETERIZATION N/A 05/12/2014   Procedure: Left Heart Cath and Coronary Angiography;  Surgeon: Troy Sine, MD;  Location: [facility];  Service: Cardiovascular;   Laterality: N/A;  . CARDIAC CATHETERIZATION N/A 05/13/2014   Procedure: Coronary/Graft Atherectomy;  Surgeon: Troy Sine, MD;  Location: [facility];  Service: Cardiovascular;  Laterality: N/A;  . CARDIAC CATHETERIZATION Right 05/13/2014   Procedure: Temporary Pacemaker;  Surgeon: Troy Sine, MD;  Location: [facility];  Service: Cardiovascular;  Laterality: Right;  . CARDIAC CATHETERIZATION N/A 05/13/2014   Procedure: Coronary Stent Intervention;  Surgeon: Troy Sine, MD;  Location: [facility];  Service: Cardiovascular;  Laterality: N/A;  . CARDIAC CATHETERIZATION N/A 06/18/2014   Procedure: Left Heart Cath and Coronary Angiography;  Surgeon: Peter M Martinique, MD;  Location: [facility];  Service: Cardiovascular;  Laterality: N/A;  . CARDIAC CATHETERIZATION N/A 08/03/2015   Procedure: Left Heart Cath and Cors/Grafts Angiography;  Surgeon: Nelva Bush, MD;  Location: [facility];  Service: Cardiovascular;  Laterality: N/A;  . CARDIOVASCULAR STRESS TEST  05/06/2014  . CATARACT EXTRACTION W/ INTRAOCULAR LENS IMPLANT Left   . CORONARY ARTERY BYPASS GRAFT N/A 06/22/2014   Procedure: CORONARY ARTERY BYPASS GRAFTING (CABG) x five, using left internal mammary artery, and right leg greater saphenous vein harvested endoscopically;  Surgeon: Melrose Nakayama, MD;  Location: [facility];  Service: Open Heart Surgery;  Laterality: N/A;  . LAPAROSCOPIC CHOLECYSTECTOMY    . MAZE N/A 06/22/2014   Procedure: MAZE;  Surgeon: Melrose Nakayama, MD;  Location: [facility];  Service: Open Heart Surgery;  Laterality: N/A;  . TEE WITHOUT CARDIOVERSION N/A 06/22/2014   Procedure: TRANSESOPHAGEAL ECHOCARDIOGRAM (TEE);  Surgeon: Melrose Nakayama, MD;  Location: [facility];  Service: Open Heart Surgery;  Laterality: N/A;    Family History  Problem Relation Age of Onset  . Alzheimer's disease Mother   . Emphysema Father   . Cancer Brother   . Arrhythmia Sister   . Heart attack  Sister   . Heart disease Sister   . Hyperlipidemia Sister   . Hypertension Sister     Allergies  Allergen Reactions  . Lipitor [Atorvastatin] Other (See Comments)    Myopathy Spring 2006  . Pravastatin Other (See Comments)    LEG CRAMPS  . Simvastatin Other (See Comments)    LEG CRAMPS    Current Outpatient Medications on File Prior to Visit  Medication Sig Dispense Refill  . albuterol (PROVENTIL HFA;VENTOLIN HFA) 108 (90 Base) MCG/ACT inhaler Inhale 2 puffs into the lungs every 4 (four) hours as needed for wheezing or shortness of breath (cough, shortness of  breath or wheezing.). 1 Inhaler 1  . aspirin 81 MG tablet Take 81 mg by mouth daily.    . carvedilol (COREG) 6.25 MG tablet TAKE 1 TABLET BY MOUTH TWICE A DAY WITH FOOD 180 tablet 0  . dicyclomine (BENTYL) 20 MG tablet Take 20 mg 2 (two) times daily by mouth.    Arne Cleveland 5 MG TABS tablet TAKE 1 TABLET BY MOUTH TWICE A DAY 60 tablet 5  . fluorometholone (FML) 0.1 % ophthalmic suspension Place 1 drop into the right eye daily as needed (for eye redness and itching).     . fluticasone (FLONASE) 50 MCG/ACT nasal spray SPRAY ONCE INTO EACH NOSTRIL AS NEEDED FOR ALLERGIES OR RHINITIS DAILY 16 g 2  . insulin NPH Human (NOVOLIN N RELION) 100 UNIT/ML injection Inject 35 units into the skin every morning and 40 units into the skin in the evening. 10 mL 11  . Insulin Pen Needle 31G X 8 MM MISC Use as directed with Lantus. 100 each 5  . insulin regular (NOVOLIN R RELION) 100 units/mL injection Inject 30 units into the skin twice daily with a smaller meal. Inject 32 units into the skin twice daily with a larger meal or if you have dessert. 10 mL 11  . isosorbide mononitrate (IMDUR) 30 MG 24 hr tablet Take 1 tablet (30 mg total) by mouth daily. Please keep upcoming appt in January for future refills. Thank you 90 tablet 0  . lisinopril (PRINIVIL,ZESTRIL) 5 MG tablet TAKE 1 TABLET (5 MG TOTAL) DAILY BY MOUTH. 90 tablet 0  . MAGNESIUM PO Take 1  tablet by mouth daily as needed (for leg cramping).     . nitroGLYCERIN (NITROSTAT) 0.4 MG SL tablet Place 1 tablet (0.4 mg total) under the tongue every 5 (five) minutes as needed for chest pain. 25 tablet 3  . rosuvastatin (CRESTOR) 10 MG tablet Take 1 tablet (10 mg total) by mouth daily. Please keep upcoming appt in January for future refills. Thank you 90 tablet 0  . tamsulosin (FLOMAX) 0.4 MG CAPS capsule Take 1 capsule (0.4 mg total) by mouth as needed. For difficulty urinating. 90 capsule 0  . traMADol (ULTRAM) 50 MG tablet Take 1 tablet (50 mg total) by mouth every 12 (twelve) hours as needed for severe pain. (Patient not taking: Reported on 12/03/2017) 14 tablet 0   No current facility-administered medications on file prior to visit.     BP 120/74   Pulse 73   Temp 97.8 F (36.6 C) (Oral)   Ht 5' 7.75" (1.721 m)   Wt 214 lb 8 oz (97.3 kg)   SpO2 98%   BMI 32.86 kg/m    Objective:   Physical Exam  Constitutional: He is oriented to person, place, and time. He appears well-nourished.  Neck: Neck supple.  Cardiovascular: Normal rate and regular rhythm.  Respiratory: Effort normal and breath sounds normal.  Neurological: He is alert and oriented to person, place, and time.  Skin: Skin is warm and dry.           Assessment & Plan:

## 2017-12-03 NOTE — Assessment & Plan Note (Signed)
Glucose ranging 125-240 per patient, would like to see him between 120-140 given cardiac history.   Decrease regular acting insulin in the morning to 25 units given history of dropping while playing golf, continue 30-32 units in the evening. Increase morning NPH to 38 units, continue evening at 40 units.  Discussed to rotate times of glucose checks, report readings consistently below 80 or above 200. Follow up in 1 month.

## 2017-12-07 ENCOUNTER — Other Ambulatory Visit: Payer: Self-pay | Admitting: Primary Care

## 2017-12-07 DIAGNOSIS — Z23 Encounter for immunization: Secondary | ICD-10-CM

## 2017-12-07 MED ORDER — ZOSTER VAC RECOMB ADJUVANTED 50 MCG/0.5ML IM SUSR: 0.5000 mL | Freq: Once | INTRAMUSCULAR | 1 refills | Status: AC

## 2018-01-02 ENCOUNTER — Ambulatory Visit (HOSPITAL_COMMUNITY)
Admission: EM | Admit: 2018-01-02 | Discharge: 2018-01-02 | Disposition: A | Discharge status: Home / Self Care | Payer: Medicare Other | Attending: Family Medicine | Admitting: Family Medicine

## 2018-01-02 ENCOUNTER — Encounter (HOSPITAL_COMMUNITY): Payer: Self-pay | Admitting: Emergency Medicine

## 2018-01-02 ENCOUNTER — Other Ambulatory Visit: Payer: Self-pay

## 2018-01-02 DIAGNOSIS — R059 Cough, unspecified: Secondary | ICD-10-CM

## 2018-01-02 DIAGNOSIS — R69 Illness, unspecified: Secondary | ICD-10-CM | POA: Diagnosis not present

## 2018-01-02 DIAGNOSIS — J111 Influenza due to unidentified influenza virus with other respiratory manifestations: Secondary | ICD-10-CM

## 2018-01-02 DIAGNOSIS — R05 Cough: Secondary | ICD-10-CM | POA: Insufficient documentation

## 2018-01-02 MED ORDER — HYDROCODONE-HOMATROPINE 5-1.5 MG/5ML PO SYRP: 5.0000 mL | ORAL_SOLUTION | Freq: Four times a day (QID) | ORAL | 0 refills | Status: DC | PRN

## 2018-01-02 NOTE — Discharge Instructions (Signed)
Be aware, your cough medication may cause drowsiness. Please do not drive, operate heavy machinery or make important decisions while on this medication, it can cloud your judgement.  Follow up with your primary care doctor or here if you are not seeing improvement of your symptoms over the next several days, sooner if you feel you are worsening.  Caring for yourself: Get plenty of rest. Drink plenty of fluids, enough so that your urine is light yellow or clear like water. If you have kidney, heart, or liver disease and have to limit fluids, talk with your doctor before you increase the amount of fluids you drink. Take an over-the-counter pain medicine if needed, such as acetaminophen (Tylenol), ibuprofen (Advil, Motrin), or naproxen (Aleve), to relieve fever, headache, and muscle aches. Read and follow all instructions on the label. No one younger than 20 should take aspirin. It has been linked to Reye syndrome, a serious illness. Before you use over the counter cough and cold medicines, check the label. These medicines may not be safe for children younger than age 41 or for people with certain health problems. If the skin around your nose and lips becomes sore, put some petroleum jelly on the area.  Avoid spreading the virus: Wash your hands regularly, and keep your hands away from your face.  Stay home from school, work, and other public places until you are feeling better and your fever has been gone for at least 24 hours. The fever needs to have gone away on its own without the help of medicine.

## 2018-01-02 NOTE — ED Triage Notes (Signed)
Sunday night started with vomiting.  Last time he vomited was Sunday.  Patient has general aches, abdominal pain, throat pain, aching all over

## 2018-01-02 NOTE — ED Provider Notes (Signed)
[facility]   [id number] 01/02/18 Arrival Time: 0867  ASSESSMENT & PLAN:  1. Influenza-like illness   2. Cough    No sign of bacterial illness at this time including pneumonia. No indication for chest imaging today. Discussed.  Meds ordered this encounter  Medications  . HYDROcodone-homatropine (HYCODAN) 5-1.5 MG/5ML syrup    Sig: Take 5 mLs by mouth every 6 (six) hours as needed for cough.    Dispense:  90 mL    Refill:  0   See AVS for d/c instructions.  [place] Controlled Substances Registry consulted for this patient. I feel the risk/benefit ratio today is favorable for proceeding with this prescription for a controlled substance. Medication sedation precautions given.  Cough medication sedation precautions. Discussed typical duration of symptoms. OTC symptom care as needed. Ensure adequate fluid intake and rest. May f/u with PCP or here as needed.  Reviewed expectations re: course of current medical issues. Questions answered. Outlined signs and symptoms indicating need for more acute intervention. Patient verbalized understanding. After Visit Summary given.   SUBJECTIVE: History from: patient.  [name] is a 73 y.o. male who presents with complaint of nasal congestion, post-nasal drainage, and a persistent dry cough; with a mild sore throat. Onset abrupt, at age approx 3 days ago. Vomited once on day #1; none since. No persistent nausea. Overall with fatigue and with body aches. SOB: none. Wheezing: none. Fever: unsure; no chills. Overall normal PO intake. Sick contacts: yes, granddaughter with similar. No specific or significant aggravating or alleviating factors reported. OTC treatment: none reported. Coughing is bothering him the most.  Received flu shot this year: yes.  Social History   Tobacco Use  Smoking Status Never Smoker  Smokeless Tobacco Never Used   ROS: As per HPI. All other systems negative.    OBJECTIVE:  Vitals:   01/02/18 1009    BP: 124/70  Pulse: 77  Resp: 18  Temp: 98.1 F (36.7 C)  TempSrc: Oral  SpO2: 99%    General appearance: alert; appears fatigued HEENT: nasal congestion; clear runny nose; throat irritation secondary to post-nasal drainage Neck: supple without LAD CV: RRR Lungs: unlabored respirations, symmetrical air entry without wheezing; cough: moderate Abd: soft Ext: no LE edema Skin: warm and dry Psychological: alert and cooperative; normal mood and affect   Allergies  Allergen Reactions  . Lipitor [Atorvastatin] Other (See Comments)    Myopathy Spring 2006  . Pravastatin Other (See Comments)    LEG CRAMPS  . Simvastatin Other (See Comments)    LEG CRAMPS    Past Medical History:  Diagnosis Date  . Arthritis    "fingers, shoulders" (08/03/2015)  . Atrial fibrillation (HCC)    Paroxysmal, rare episodes, sinus rhythm on flecainide, patient prefers not to take Coumadin  . Bradycardia    April, 2013  . Chest pain    Nuclear, 2006, no ischemia  . Chronic lower back pain    "all my life"  . Coronary artery disease    s/p staged cath 05/12/2014 and 5/11, DES x 2 to heavily calcified RCA, residual with 60% prox LAD, 70% D1  . Ejection fraction    EF 55-60%,  echo, January, 2011  . Elevated CPK    CPK elevated with normal MB and normal troponin the past  . Heart murmur   . History of echocardiogram    Echo 8/16: EF 55%, normal wall motion, grade 2 diastolic dysfunction, trivial MR, normal RV function, PASP 27 mmHg  .  Hypertension   . IBS (irritable bowel syndrome)   . Myocardial infarction ([place]) 06/2014  . Sinus drainage   . Type II diabetes mellitus (HCC)    Family History  Problem Relation Age of Onset  . Alzheimer's disease Mother   . Emphysema Father   . Cancer Brother   . Arrhythmia Sister   . Heart attack Sister   . Heart disease Sister   . Hyperlipidemia Sister   . Hypertension Sister    Social History   Socioeconomic History  . Marital status: Married     Spouse name: Not on file  . Number of children: Not on file  . Years of education: Not on file  . Highest education level: Not on file  Occupational History  . Not on file  Social Needs  . Financial resource strain: Not on file  . Food insecurity:    Worry: Not on file    Inability: Not on file  . Transportation needs:    Medical: Not on file    Non-medical: Not on file  Tobacco Use  . Smoking status: Never Smoker  . Smokeless tobacco: Never Used  Substance and Sexual Activity  . Alcohol use: No  . Drug use: No  . Sexual activity: Not on file  Lifestyle  . Physical activity:    Days per week: Not on file    Minutes per session: Not on file  . Stress: Not on file  Relationships  . Social connections:    Talks on phone: Not on file    Gets together: Not on file    Attends religious service: Not on file    Active member of club or organization: Not on file    Attends meetings of clubs or organizations: Not on file    Relationship status: Not on file  . Intimate partner violence:    Fear of current or ex partner: Not on file    Emotionally abused: Not on file    Physically abused: Not on file    Forced sexual activity: Not on file  Other Topics Concern  . Not on file  Social History Narrative  . Not on file           Vanessa Kick, MD 01/02/18 1055

## 2018-01-09 ENCOUNTER — Ambulatory Visit (INDEPENDENT_AMBULATORY_CARE_PROVIDER_SITE_OTHER)
Admission: RE | Admit: 2018-01-09 | Discharge: 2018-01-09 | Disposition: A | Discharge status: Home / Self Care | Payer: Medicare Other | Source: Ambulatory Visit | Attending: Family Medicine | Admitting: Family Medicine

## 2018-01-09 ENCOUNTER — Ambulatory Visit (INDEPENDENT_AMBULATORY_CARE_PROVIDER_SITE_OTHER): Payer: Medicare Other | Admitting: Family Medicine

## 2018-01-09 ENCOUNTER — Encounter: Payer: Self-pay | Admitting: Family Medicine

## 2018-01-09 VITALS — BP 122/60 | HR 82 | Temp 97.9°F | Ht 67.5 in | Wt 206.2 lb

## 2018-01-09 DIAGNOSIS — B9789 Other viral agents as the cause of diseases classified elsewhere: Secondary | ICD-10-CM

## 2018-01-09 DIAGNOSIS — R062 Wheezing: Secondary | ICD-10-CM

## 2018-01-09 DIAGNOSIS — J069 Acute upper respiratory infection, unspecified: Secondary | ICD-10-CM

## 2018-01-09 DIAGNOSIS — R05 Cough: Secondary | ICD-10-CM | POA: Diagnosis not present

## 2018-01-09 MED ORDER — AZITHROMYCIN 250 MG PO TABS: ORAL_TABLET | ORAL | 0 refills | Status: DC

## 2018-01-09 MED ORDER — HYDROCODONE-HOMATROPINE 5-1.5 MG/5ML PO SYRP: 5.0000 mL | ORAL_SOLUTION | Freq: Three times a day (TID) | ORAL | 0 refills | Status: DC | PRN

## 2018-01-09 MED ORDER — ALBUTEROL SULFATE HFA 108 (90 BASE) MCG/ACT IN AERS: 2.0000 | INHALATION_SPRAY | RESPIRATORY_TRACT | 1 refills | Status: DC | PRN

## 2018-01-09 MED ORDER — BENZONATATE 200 MG PO CAPS: 200.0000 mg | ORAL_CAPSULE | Freq: Three times a day (TID) | ORAL | 1 refills | Status: DC | PRN

## 2018-01-09 NOTE — Patient Instructions (Signed)
Chest xray now  We will call you with results  Start zpak for infection  Fluids/rest  Use inhaler as needed  Both cough medicines (caution of sedation)   Update if not starting to improve in a week or if worsening

## 2018-01-09 NOTE — Progress Notes (Signed)
Subjective:    Patient ID: [name], male    DOB: Jan 19, 1945, 73 y.o.   MRN: [id number]  HPI Here for congestion and cough   73 yo pt of NP [facility]ark with uncontrolled diabetes  Went to UC on 01/02/18  Diagnosed with flu like illness tx with hycodan for symptoms   Not getting better  Now coughing more  Hurting in chest and abdomen from coughing  Prod of yellow mucous (non smoker)  Was wheezing - improved now   A lot of pnd  ST came back this am   No fever  No headache Sinus pressure around eyes  Eyes are watery - some d/c in am and crust   Also some loose stools No n/v   Tried otc mucinex dm last week  Used hycodan - out of it now   CXR today : Dg Chest 2 View  Result Date: 01/09/2018 CLINICAL DATA:  Cough. EXAM: CHEST - 2 VIEW COMPARISON:  Radiographs of August 02, 2015. FINDINGS: The heart size and mediastinal contours are within normal limits. Both lungs are clear. No pneumothorax or pleural effusion is noted. Status post coronary artery bypass graft. The visualized skeletal structures are unremarkable. IMPRESSION: No active cardiopulmonary disease. Electronically Signed   By: Marijo Conception, M.D.   On: 01/09/2018 15:02     Patient Active Problem List   Diagnosis Date Noted  . URI (upper respiratory infection) 01/09/2018  . NSTEMI (non-ST elevated myocardial infarction) ([place]) 08/02/2015  . Insomnia 07/21/2014  . Hx of CABG 07/19/2014  . Nonsustained ventricular tachycardia ([place]) 06/23/2014  . Cardiomyopathy, ischemic   . CAD (coronary artery disease) of artery bypass graft   . Junctional bradycardia   . CAD (coronary artery disease), native coronary artery 05/12/2014  . Hyperlipidemia 04/21/2013  . NASH (nonalcoholic steatohepatitis) 09/06/2011  . Rotator cuff tendinitis 07/28/2011  . Hypertension   . BPH (benign prostatic hyperplasia) 03/11/2011  . IBS (irritable bowel syndrome) 03/11/2011  . Bursitis of hip 03/11/2011  . DM type 2 (diabetes mellitus, type  2) ([place]) 01/21/2009  . ATRIAL FIBRILLATION, PAROXYSMAL 01/21/2009   Past Medical History:  Diagnosis Date  . Arthritis    "fingers, shoulders" (08/03/2015)  . Atrial fibrillation (HCC)    Paroxysmal, rare episodes, sinus rhythm on flecainide, patient prefers not to take Coumadin  . Bradycardia    April, 2013  . Chest pain    Nuclear, 2006, no ischemia  . Chronic lower back pain    "all my life"  . Coronary artery disease    s/p staged cath 05/12/2014 and 5/11, DES x 2 to heavily calcified RCA, residual with 60% prox LAD, 70% D1  . Ejection fraction    EF 55-60%,  echo, January, 2011  . Elevated CPK    CPK elevated with normal MB and normal troponin the past  . Heart murmur   . History of echocardiogram    Echo 8/16: EF 55%, normal wall motion, grade 2 diastolic dysfunction, trivial MR, normal RV function, PASP 27 mmHg  . Hypertension   . IBS (irritable bowel syndrome)   . Myocardial infarction ([place]) 06/2014  . Sinus drainage   . Type II diabetes mellitus ([place])    Past Surgical History:  Procedure Laterality Date  . APPENDECTOMY    . CARDIAC CATHETERIZATION N/A 05/12/2014   Procedure: Left Heart Cath and Coronary Angiography;  Surgeon: Troy Sine, MD;  Location: [facility];  Service: Cardiovascular;  Laterality: N/A;  .  CARDIAC CATHETERIZATION N/A 05/13/2014   Procedure: Coronary/Graft Atherectomy;  Surgeon: Troy Sine, MD;  Location: [facility];  Service: Cardiovascular;  Laterality: N/A;  . CARDIAC CATHETERIZATION Right 05/13/2014   Procedure: Temporary Pacemaker;  Surgeon: Troy Sine, MD;  Location: [facility];  Service: Cardiovascular;  Laterality: Right;  . CARDIAC CATHETERIZATION N/A 05/13/2014   Procedure: Coronary Stent Intervention;  Surgeon: Troy Sine, MD;  Location: [facility];  Service: Cardiovascular;  Laterality: N/A;  . CARDIAC CATHETERIZATION N/A 06/18/2014   Procedure: Left Heart Cath and Coronary Angiography;   Surgeon: Peter M Martinique, MD;  Location: [facility];  Service: Cardiovascular;  Laterality: N/A;  . CARDIAC CATHETERIZATION N/A 08/03/2015   Procedure: Left Heart Cath and Cors/Grafts Angiography;  Surgeon: Nelva Bush, MD;  Location: [facility];  Service: Cardiovascular;  Laterality: N/A;  . CARDIOVASCULAR STRESS TEST  05/06/2014  . CATARACT EXTRACTION W/ INTRAOCULAR LENS IMPLANT Left   . CORONARY ARTERY BYPASS GRAFT N/A 06/22/2014   Procedure: CORONARY ARTERY BYPASS GRAFTING (CABG) x five, using left internal mammary artery, and right leg greater saphenous vein harvested endoscopically;  Surgeon: Melrose Nakayama, MD;  Location: [facility] [facility];  Service: Open Heart Surgery;  Laterality: N/A;  . LAPAROSCOPIC CHOLECYSTECTOMY    . MAZE N/A 06/22/2014   Procedure: MAZE;  Surgeon: Melrose Nakayama, MD;  Location: [facility];  Service: Open Heart Surgery;  Laterality: N/A;  . TEE WITHOUT CARDIOVERSION N/A 06/22/2014   Procedure: TRANSESOPHAGEAL ECHOCARDIOGRAM (TEE);  Surgeon: Melrose Nakayama, MD;  Location: [facility];  Service: Open Heart Surgery;  Laterality: N/A;   Social History   Tobacco Use  . Smoking status: Never Smoker  . Smokeless tobacco: Never Used  Substance Use Topics  . Alcohol use: No  . Drug use: No   Family History  Problem Relation Age of Onset  . Alzheimer's disease Mother   . Emphysema Father   . Cancer Brother   . Arrhythmia Sister   . Heart attack Sister   . Heart disease Sister   . Hyperlipidemia Sister   . Hypertension Sister    Allergies  Allergen Reactions  . Lipitor [Atorvastatin] Other (See Comments)    Myopathy Spring 2006  . Pravastatin Other (See Comments)    LEG CRAMPS  . Simvastatin Other (See Comments)    LEG CRAMPS   Current Outpatient Medications on File Prior to Visit  Medication Sig Dispense Refill  . aspirin 81 MG tablet Take 81 mg by mouth daily.    . carvedilol (COREG) 6.25 MG tablet TAKE 1 TABLET BY MOUTH TWICE A DAY WITH FOOD  180 tablet 0  . dicyclomine (BENTYL) 20 MG tablet Take 20 mg 2 (two) times daily by mouth.    Arne Cleveland 5 MG TABS tablet TAKE 1 TABLET BY MOUTH TWICE A DAY 60 tablet 5  . fluorometholone (FML) 0.1 % ophthalmic suspension Place 1 drop into the right eye daily as needed (for eye redness and itching).     . fluticasone (FLONASE) 50 MCG/ACT nasal spray SPRAY ONCE INTO EACH NOSTRIL AS NEEDED FOR ALLERGIES OR RHINITIS DAILY 16 g 2  . insulin NPH Human (NOVOLIN N RELION) 100 UNIT/ML injection Inject 38 units into the skin every morning and 40 units into the skin in the evening. 10 mL 11  . Insulin Pen Needle 31G X 8 MM MISC Use as directed with Lantus. 100 each 5  . insulin regular (NOVOLIN R  RELION) 100 units/mL injection Inject 25 units into the skin twice daily with a smaller meal. Inject 32 units into the skin twice daily with a larger meal or if you have dessert. 10 mL 11  . isosorbide mononitrate (IMDUR) 30 MG 24 hr tablet Take 1 tablet (30 mg total) by mouth daily. Please keep upcoming appt in January for future refills. Thank you 90 tablet 0  . lisinopril (PRINIVIL,ZESTRIL) 5 MG tablet TAKE 1 TABLET (5 MG TOTAL) DAILY BY MOUTH. 90 tablet 0  . MAGNESIUM PO Take 1 tablet by mouth daily as needed (for leg cramping).     . nitroGLYCERIN (NITROSTAT) 0.4 MG SL tablet Place 1 tablet (0.4 mg total) under the tongue every 5 (five) minutes as needed for chest pain. 25 tablet 3  . rosuvastatin (CRESTOR) 10 MG tablet Take 1 tablet (10 mg total) by mouth daily. Please keep upcoming appt in January for future refills. Thank you 90 tablet 0  . tamsulosin (FLOMAX) 0.4 MG CAPS capsule TAKE 1 CAPSULE (0.4 MG TOTAL) BY MOUTH AS NEEDED. FOR DIFFICULTY URINATING. 90 capsule 1  . traMADol (ULTRAM) 50 MG tablet Take 1 tablet (50 mg total) by mouth every 12 (twelve) hours as needed for severe pain. 14 tablet 0   No current facility-administered medications on file prior to visit.     Review of Systems     Objective:     Physical Exam        Assessment & Plan:   Problem List Items Addressed This Visit      Respiratory   URI (upper respiratory infection) - Primary    Rev UC visit from 01/02/18 Cough persists with chest and back soreness  Exam is reassuring No infiltrate on cxr  Cover with zpack  Disc symptomatic care - see instructions on AVS  Avs: Start zpak for infection  Fluids/rest  Use inhaler as needed  Both cough medicines (caution of sedation)  Update if not starting to improve in a week or if worsening        Relevant Medications   azithromycin (ZITHROMAX Z-PAK) 250 MG tablet   Other Relevant Orders   DG Chest 2 View (Completed)    Other Visit Diagnoses    Wheeze       Relevant Medications   albuterol (PROVENTIL HFA;VENTOLIN HFA) 108 (90 Base) MCG/ACT inhaler   Viral URI with cough       Relevant Medications   azithromycin (ZITHROMAX Z-PAK) 250 MG tablet   albuterol (PROVENTIL HFA;VENTOLIN HFA) 108 (90 Base) MCG/ACT inhaler

## 2018-01-09 NOTE — Assessment & Plan Note (Signed)
Rev UC visit from 01/02/18 Cough persists with chest and back soreness  Exam is reassuring No infiltrate on cxr  Cover with zpack  Disc symptomatic care - see instructions on AVS  Avs: Start zpak for infection  Fluids/rest  Use inhaler as needed  Both cough medicines (caution of sedation)  Update if not starting to improve in a week or if worsening

## 2018-01-10 NOTE — Progress Notes (Signed)
Cardiology Office Note   Date:  01/14/2018   ID:  [name], DOB Mar 23, 1945, MRN [id number]  PCP:  [name], NP    No chief complaint on file.  CAD/AFib  Wt Readings from Last 3 Encounters:  01/14/18 210 lb 3.2 oz (95.3 kg)  01/09/18 206 lb 4 oz (93.6 kg)  12/03/17 214 lb 8 oz (97.3 kg)       History of Present Illness: [name] is a 73 y.o. male  who presents today to follow-up coronary disease. He had stents in 5/16 with rotational atherectomy. He then had early restenosis and progression of left sided disease. He had surgery with Dr. Roxan Hockey. He is post-CABG; after having surgery in June 2016. He has some toe numbness intermittently. His surgery was June 22, 2014. In addition he had a maze procedure and a left atrial appendage clip was placed. He remains on anticoagulation. During his initial hospitalization his EF was in the 40-45% range. EF had normalized at 55%. He feels great.  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. He had palpitations with his prior atrial fibrillation. He did have left atrial appendage clipping during his open heart surgery.   He had a NSTEMI in Augist 2017. EF by cath was low. EF by echo was 50-55%. SVG to RCA occluded but medically managed.    Since the last visit, he had a severe upper respiratory infection.  He was treated with a Z-pack.   He plays golf 3x/week.  He walks a lot on the golf course.    He uses a stepper machine at home.    Denies : Chest pain. Dizziness. Leg edema. Nitroglycerin use. Orthopnea. Palpitations. Paroxysmal nocturnal dyspnea. Shortness of breath. Syncope.  Tolerating meds.  No headaches.   His labs were checked in 8-9/19.  A1C was elevated.  He has tried to improve his diet.  He wants to walk more.      Past Medical History:  Diagnosis Date  . Arthritis    "fingers, shoulders" (08/03/2015)  . Atrial fibrillation (HCC)    Paroxysmal, rare episodes, sinus rhythm on  flecainide, patient prefers not to take Coumadin  . Bradycardia    April, 2013  . Chest pain    Nuclear, 2006, no ischemia  . Chronic lower back pain    "all my life"  . Coronary artery disease    s/p staged cath 05/12/2014 and 5/11, DES x 2 to heavily calcified RCA, residual with 60% prox LAD, 70% D1  . Ejection fraction    EF 55-60%,  echo, January, 2011  . Elevated CPK    CPK elevated with normal MB and normal troponin the past  . Heart murmur   . History of echocardiogram    Echo 8/16: EF 55%, normal wall motion, grade 2 diastolic dysfunction, trivial MR, normal RV function, PASP 27 mmHg  . Hypertension   . IBS (irritable bowel syndrome)   . Myocardial infarction ([place]) 06/2014  . Sinus drainage   . Type II diabetes mellitus ([place])     Past Surgical History:  Procedure Laterality Date  . APPENDECTOMY    . CARDIAC CATHETERIZATION N/A 05/12/2014   Procedure: Left Heart Cath and Coronary Angiography;  Surgeon: Troy Sine, MD;  Location: [facility];  Service: Cardiovascular;  Laterality: N/A;  . CARDIAC CATHETERIZATION N/A 05/13/2014   Procedure: Coronary/Graft Atherectomy;  Surgeon: Troy Sine, MD;  Location: [facility];  Service: Cardiovascular;  Laterality:  N/A;  . CARDIAC CATHETERIZATION Right 05/13/2014   Procedure: Temporary Pacemaker;  Surgeon: Troy Sine, MD;  Location: [facility];  Service: Cardiovascular;  Laterality: Right;  . CARDIAC CATHETERIZATION N/A 05/13/2014   Procedure: Coronary Stent Intervention;  Surgeon: Troy Sine, MD;  Location: [facility];  Service: Cardiovascular;  Laterality: N/A;  . CARDIAC CATHETERIZATION N/A 06/18/2014   Procedure: Left Heart Cath and Coronary Angiography;  Surgeon: Peter M Martinique, MD;  Location: [facility];  Service: Cardiovascular;  Laterality: N/A;  . CARDIAC CATHETERIZATION N/A 08/03/2015   Procedure: Left Heart Cath and Cors/Grafts Angiography;  Surgeon: Nelva Bush, MD;   Location: [facility];  Service: Cardiovascular;  Laterality: N/A;  . CARDIOVASCULAR STRESS TEST  05/06/2014  . CATARACT EXTRACTION W/ INTRAOCULAR LENS IMPLANT Left   . CORONARY ARTERY BYPASS GRAFT N/A 06/22/2014   Procedure: CORONARY ARTERY BYPASS GRAFTING (CABG) x five, using left internal mammary artery, and right leg greater saphenous vein harvested endoscopically;  Surgeon: Melrose Nakayama, MD;  Location: [facility];  Service: Open Heart Surgery;  Laterality: N/A;  . LAPAROSCOPIC CHOLECYSTECTOMY    . MAZE N/A 06/22/2014   Procedure: MAZE;  Surgeon: Melrose Nakayama, MD;  Location: [facility];  Service: Open Heart Surgery;  Laterality: N/A;  . TEE WITHOUT CARDIOVERSION N/A 06/22/2014   Procedure: TRANSESOPHAGEAL ECHOCARDIOGRAM (TEE);  Surgeon: Melrose Nakayama, MD;  Location: [facility];  Service: Open Heart Surgery;  Laterality: N/A;     Current Outpatient Medications  Medication Sig Dispense Refill  . albuterol (PROVENTIL HFA;VENTOLIN HFA) 108 (90 Base) MCG/ACT inhaler Inhale 2 puffs into the lungs every 4 (four) hours as needed for wheezing or shortness of breath (cough, shortness of breath or wheezing.). 1 Inhaler 1  . aspirin 81 MG tablet Take 81 mg by mouth daily.    Marland Kitchen azithromycin (ZITHROMAX Z-PAK) 250 MG tablet Take 2 pills by mouth today and then 1 pill daily for 4 days 6 tablet 0  . benzonatate (TESSALON) 200 MG capsule Take 1 capsule (200 mg total) by mouth 3 (three) times daily as needed. Do not bite pill 30 capsule 1  . carvedilol (COREG) 6.25 MG tablet TAKE 1 TABLET BY MOUTH TWICE A DAY WITH FOOD 180 tablet 0  . dicyclomine (BENTYL) 20 MG tablet Take 20 mg 2 (two) times daily by mouth.    [name] 5 MG TABS tablet TAKE 1 TABLET BY MOUTH TWICE A DAY 60 tablet 5  . fluorometholone (FML) 0.1 % ophthalmic suspension Place 1 drop into the right eye daily as needed (for eye redness and itching).     . fluticasone (FLONASE) 50 MCG/ACT nasal spray SPRAY ONCE INTO EACH NOSTRIL AS  NEEDED FOR ALLERGIES OR RHINITIS DAILY 16 g 2  . HYDROcodone-homatropine (HYCODAN) 5-1.5 MG/5ML syrup Take 5 mLs by mouth every 8 (eight) hours as needed for cough. Caution of sedation 100 mL 0  . insulin NPH Human (NOVOLIN N RELION) 100 UNIT/ML injection Inject 38 units into the skin every morning and 40 units into the skin in the evening. 10 mL 11  . Insulin Pen Needle 31G X 8 MM MISC Use as directed with Lantus. 100 each 5  . insulin regular (NOVOLIN R RELION) 100 units/mL injection Inject 25 units into the skin twice daily with a smaller meal. Inject 32 units into the skin twice daily with a larger meal or if you have dessert. 10 mL 11  . isosorbide mononitrate (  IMDUR) 30 MG 24 hr tablet Take 1 tablet (30 mg total) by mouth daily. Please keep upcoming appt in January for future refills. Thank you 90 tablet 0  . lisinopril (PRINIVIL,ZESTRIL) 5 MG tablet TAKE 1 TABLET (5 MG TOTAL) DAILY BY MOUTH. 90 tablet 0  . MAGNESIUM PO Take 1 tablet by mouth daily as needed (for leg cramping).     . nitroGLYCERIN (NITROSTAT) 0.4 MG SL tablet Place 1 tablet (0.4 mg total) under the tongue every 5 (five) minutes as needed for chest pain. 25 tablet 3  . rosuvastatin (CRESTOR) 10 MG tablet Take 1 tablet (10 mg total) by mouth daily. Please keep upcoming appt in January for future refills. Thank you 90 tablet 0  . tamsulosin (FLOMAX) 0.4 MG CAPS capsule TAKE 1 CAPSULE (0.4 MG TOTAL) BY MOUTH AS NEEDED. FOR DIFFICULTY URINATING. 90 capsule 1  . traMADol (ULTRAM) 50 MG tablet Take 1 tablet (50 mg total) by mouth every 12 (twelve) hours as needed for severe pain. 14 tablet 0   No current facility-administered medications for this visit.     Allergies:   Lipitor [atorvastatin]; Pravastatin; and Simvastatin    Social History:  The patient  reports that he has never smoked. He has never used smokeless tobacco. He reports that he does not drink alcohol or use drugs.   Family History:  The patient's family history  includes Alzheimer's disease in his mother; Arrhythmia in his sister; Cancer in his brother; Emphysema in his father; Heart attack in his sister; Heart disease in his sister; Hyperlipidemia in his sister; Hypertension in his sister.    ROS:  Please see the history of present illness.   Otherwise, review of systems are positive for recent URI.   All other systems are reviewed and negative.    PHYSICAL EXAM: VS:  BP 110/64   Pulse 77   Ht 5' 7.5" (1.715 m)   Wt 210 lb 3.2 oz (95.3 kg)   SpO2 99%   BMI 32.44 kg/m  , BMI Body mass index is 32.44 kg/m. GEN: Well nourished, well developed, in no acute distress  HEENT: normal  Neck: no JVD, carotid bruits, or masses Cardiac: RRR; no murmurs, rubs, or gallops,no edema  Respiratory:  clear to auscultation bilaterally, normal work of breathing GI: soft, nontender, nondistended, + BS, mild obesity MS: no deformity or atrophy  Skin: warm and dry, skin irritation on the chest from his cat Neuro:  Strength and sensation are intact Psych: euthymic mood, full affect   EKG:   The ekg ordered today demonstrates NSR, nonspecific ST segment changes   Recent Labs: 09/05/2017: ALT 22; BUN 15; Creatinine, Ser 1.04; Potassium 4.7; Sodium 139   Lipid Panel    Component Value Date/Time   CHOL 98 09/05/2017 0913   TRIG 68.0 09/05/2017 0913   HDL 38.20 (L) 09/05/2017 0913   CHOLHDL 3 09/05/2017 0913   VLDL 13.6 09/05/2017 0913   LDLCALC 46 09/05/2017 0913     Other studies Reviewed: Additional studies/ records that were reviewed today with results demonstrating: EF 50-55% in 2018.   ASSESSMENT AND PLAN:  1. AFib: No palpitations.  No bleeding problems.  DOing well on Eliquis.  WIll check Hbg. 2. CAD: s/p CABG.  Occluded SVG to RCA.  Occluded native RCA. No angina on meds. 3. Hyperlipidemia: LDL 46 in 9/19.  Continue lipid owering therapy.  4. Diabetes: A1C elevated. Healthy diet encouraged. Increase exercise.     Current medicines are  reviewed  at length with the patient today.  The patient concerns regarding his medicines were addressed.  The following changes have been made:  No change  Labs/ tests ordered today include: CBC BMet No orders of the defined types were placed in this encounter.   Recommend 150 minutes/week of aerobic exercise Low fat, low carb, high fiber diet recommended  Disposition:   FU in 1 year   Signed, Larae Grooms, MD  01/14/2018 8:15 AM    [facility] [address], [place], [place]  [address] Phone: [phone]; Fax: [phone]

## 2018-01-14 ENCOUNTER — Ambulatory Visit (INDEPENDENT_AMBULATORY_CARE_PROVIDER_SITE_OTHER): Payer: Medicare Other | Admitting: Interventional Cardiology

## 2018-01-14 ENCOUNTER — Encounter: Payer: Self-pay | Admitting: Interventional Cardiology

## 2018-01-14 VITALS — BP 110/64 | HR 77 | Ht 67.5 in | Wt 210.2 lb

## 2018-01-14 DIAGNOSIS — E782 Mixed hyperlipidemia: Secondary | ICD-10-CM

## 2018-01-14 DIAGNOSIS — I25118 Atherosclerotic heart disease of native coronary artery with other forms of angina pectoris: Secondary | ICD-10-CM

## 2018-01-14 DIAGNOSIS — E1159 Type 2 diabetes mellitus with other circulatory complications: Secondary | ICD-10-CM | POA: Diagnosis not present

## 2018-01-14 DIAGNOSIS — I2510 Atherosclerotic heart disease of native coronary artery without angina pectoris: Secondary | ICD-10-CM | POA: Diagnosis not present

## 2018-01-14 DIAGNOSIS — I480 Paroxysmal atrial fibrillation: Secondary | ICD-10-CM

## 2018-01-14 LAB — CBC
Hematocrit: 44.1 % (ref 37.5–51.0)
Hemoglobin: 14.8 g/dL (ref 13.0–17.7)
MCH: 30 pg (ref 26.6–33.0)
MCHC: 33.6 g/dL (ref 31.5–35.7)
MCV: 89 fL (ref 79–97)
Platelets: 242 10*3/uL (ref 150–450)
RBC: 4.94 x10E6/uL (ref 4.14–5.80)
RDW: 12.8 % (ref 11.6–15.4)
WBC: 8.1 10*3/uL (ref 3.4–10.8)

## 2018-01-14 LAB — BASIC METABOLIC PANEL
BUN/Creatinine Ratio: 18 (ref 10–24)
BUN: 19 mg/dL (ref 8–27)
CO2: 21 mmol/L (ref 20–29)
Calcium: 9.7 mg/dL (ref 8.6–10.2)
Chloride: 102 mmol/L (ref 96–106)
Creatinine, Ser: 1.07 mg/dL (ref 0.76–1.27)
GFR calc Af Amer: 80 mL/min/{1.73_m2} (ref >59)
GFR calc non Af Amer: 69 mL/min/{1.73_m2} (ref >59)
Glucose: 228 mg/dL — ABNORMAL HIGH (ref 65–99)
Potassium: 4.5 mmol/L (ref 3.5–5.2)
Sodium: 138 mmol/L (ref 134–144)

## 2018-01-14 NOTE — Patient Instructions (Signed)
Medication Instructions:  Your physician recommends that you continue on your current medications as directed. Please refer to the Current Medication list given to you today.  If you need a refill on your cardiac medications before your next appointment, please call your pharmacy.   Lab work: TODAY: CBC, BMET  If you have labs (blood work) drawn today and your tests are completely normal, you will receive your results only by: Marland Kitchen MyChart Message (if you have MyChart) OR . A paper copy in the mail If you have any lab test that is abnormal or we need to change your treatment, we will call you to review the results.  Testing/Procedures: None ordered  Follow-Up: At [facility], you and your health needs are our priority.  As part of our continuing mission to provide you with exceptional heart care, we have created designated Provider Care Teams.  These Care Teams include your primary Cardiologist (physician) and Advanced Practice Providers (APPs -  Physician Assistants and Nurse Practitioners) who all work together to provide you with the care you need, when you need it. . You will need a follow up appointment in 1 year.  Please call our office 2 months in advance to schedule this appointment.  You may see Casandra Doffing, MD or one of the following Advanced Practice Providers on your designated Care Team:   . Lyda Jester, PA-C . Dayna Dunn, PA-C . Ermalinda Barrios, PA-C  Any Other Special Instructions Will Be Listed Below (If Applicable).

## 2018-01-16 ENCOUNTER — Encounter: Payer: Self-pay | Admitting: Primary Care

## 2018-01-16 ENCOUNTER — Ambulatory Visit (INDEPENDENT_AMBULATORY_CARE_PROVIDER_SITE_OTHER): Payer: Medicare Other | Admitting: Primary Care

## 2018-01-16 VITALS — BP 130/74 | HR 73 | Temp 98.2°F | Wt 207.5 lb

## 2018-01-16 DIAGNOSIS — I25118 Atherosclerotic heart disease of native coronary artery with other forms of angina pectoris: Secondary | ICD-10-CM

## 2018-01-16 DIAGNOSIS — E1159 Type 2 diabetes mellitus with other circulatory complications: Secondary | ICD-10-CM

## 2018-01-16 DIAGNOSIS — J069 Acute upper respiratory infection, unspecified: Secondary | ICD-10-CM

## 2018-01-16 DIAGNOSIS — Z794 Long term (current) use of insulin: Secondary | ICD-10-CM

## 2018-01-16 LAB — POCT GLYCOSYLATED HEMOGLOBIN (HGB A1C): Hemoglobin A1C: 9.4 % — AB (ref 4.0–5.6)

## 2018-01-16 MED ORDER — BENZONATATE 200 MG PO CAPS: 200.0000 mg | ORAL_CAPSULE | Freq: Three times a day (TID) | ORAL | 0 refills | Status: DC | PRN

## 2018-01-16 NOTE — Assessment & Plan Note (Signed)
Uncontrolled for a long time, has refused endocrinology evaluation numerous times. A1C with slow, but gradual improvement over the last 6 months. He has been sick for the last three weeks so this may be contributing to the above goal reading.   Continue current regimen for now. Strongly advised exercise daily, working on diet. He agrees. Follow up in 3 months for re-evaluation.

## 2018-01-16 NOTE — Progress Notes (Signed)
Subjective:    Patient ID: [name], male    DOB: 1945-02-15, 73 y.o.   MRN: [id number]  HPI  Mr. Kluger is a 73 year old male who presents today with a chief complaint of cough. He was also sent here by his cardiologist to discuss diabetes.  1) Cough: Evaluated by Dr. Glori Bickers on 01/09/18 for cough and congestion. Initially evaluated at Urgent Care on 01/02/18, diagnosed with flu like illness and treated with Hycodan. During his visit with Dr. Glori Bickers he mentioned persistent symptoms without improvement on prescribed and OTC treatment. He underwent chest xray which was negative for pneumonia. He was provided with a Zpak antibiotic and albuterol inhaler and asked to return if no improving.   Today he reports completing his azithromycin four days ago. Felt better when taking but then symptoms returned after completion. His current symptoms include fatigue, cough, chest congestion with creamy colored mucous. He denies fevers.   BP Readings from Last 3 Encounters:  01/16/18 130/74  01/14/18 110/64  01/09/18 122/60     2) Type 2 Diabetes:   Current medications include: Relion N 38 units in the morning, 40 units in the evening; Relion R 25 units TID, 32 units if larger meal and/or dessert.  He is checking his blood glucose 2 times daily and is getting readings of: AM Fasting: 125-200's Before Lunch: 80-200's  Last A1C: 9.8 in October 2019, 9.4 today Last Eye Exam: Due in February 2020 Last Foot Exam: Due in March  Pneumonia Vaccination: Completed in 2018 ACE/ARB: Lisinopril Statin: Crestor    Review of Systems  Constitutional: Positive for fatigue. Negative for fever.  HENT: Positive for congestion and postnasal drip. Negative for ear pain, sinus pressure, sinus pain and sore throat.   Respiratory: Positive for cough. Negative for shortness of breath and wheezing.        Past Medical History:  Diagnosis Date  . Arthritis    "fingers, shoulders" (08/03/2015)  . Atrial  fibrillation (HCC)    Paroxysmal, rare episodes, sinus rhythm on flecainide, patient prefers not to take Coumadin  . Bradycardia    April, 2013  . Chest pain    Nuclear, 2006, no ischemia  . Chronic lower back pain    "all my life"  . Coronary artery disease    s/p staged cath 05/12/2014 and 5/11, DES x 2 to heavily calcified RCA, residual with 60% prox LAD, 70% D1  . Ejection fraction    EF 55-60%,  echo, January, 2011  . Elevated CPK    CPK elevated with normal MB and normal troponin the past  . Heart murmur   . History of echocardiogram    Echo 8/16: EF 55%, normal wall motion, grade 2 diastolic dysfunction, trivial MR, normal RV function, PASP 27 mmHg  . Hypertension   . IBS (irritable bowel syndrome)   . Myocardial infarction ([place]) 06/2014  . Sinus drainage   . Type II diabetes mellitus ([place])      Social History   Socioeconomic History  . Marital status: Married    Spouse name: Not on file  . Number of children: Not on file  . Years of education: Not on file  . Highest education level: Not on file  Occupational History  . Not on file  Social Needs  . Financial resource strain: Not on file  . Food insecurity:    Worry: Not on file    Inability: Not on file  . Transportation needs:  Medical: Not on file    Non-medical: Not on file  Tobacco Use  . Smoking status: Never Smoker  . Smokeless tobacco: Never Used  Substance and Sexual Activity  . Alcohol use: No  . Drug use: No  . Sexual activity: Not on file  Lifestyle  . Physical activity:    Days per week: Not on file    Minutes per session: Not on file  . Stress: Not on file  Relationships  . Social connections:    Talks on phone: Not on file    Gets together: Not on file    Attends religious service: Not on file    Active member of club or organization: Not on file    Attends meetings of clubs or organizations: Not on file    Relationship status: Not on file  . Intimate partner violence:    Fear of  current or ex partner: Not on file    Emotionally abused: Not on file    Physically abused: Not on file    Forced sexual activity: Not on file  Other Topics Concern  . Not on file  Social History Narrative  . Not on file    Past Surgical History:  Procedure Laterality Date  . APPENDECTOMY    . CARDIAC CATHETERIZATION N/A 05/12/2014   Procedure: Left Heart Cath and Coronary Angiography;  Surgeon: Troy Sine, MD;  Location: [facility];  Service: Cardiovascular;  Laterality: N/A;  . CARDIAC CATHETERIZATION N/A 05/13/2014   Procedure: Coronary/Graft Atherectomy;  Surgeon: Troy Sine, MD;  Location: [facility];  Service: Cardiovascular;  Laterality: N/A;  . CARDIAC CATHETERIZATION Right 05/13/2014   Procedure: Temporary Pacemaker;  Surgeon: Troy Sine, MD;  Location: [facility];  Service: Cardiovascular;  Laterality: Right;  . CARDIAC CATHETERIZATION N/A 05/13/2014   Procedure: Coronary Stent Intervention;  Surgeon: Troy Sine, MD;  Location: [facility];  Service: Cardiovascular;  Laterality: N/A;  . CARDIAC CATHETERIZATION N/A 06/18/2014   Procedure: Left Heart Cath and Coronary Angiography;  Surgeon: Peter M Martinique, MD;  Location: [facility];  Service: Cardiovascular;  Laterality: N/A;  . CARDIAC CATHETERIZATION N/A 08/03/2015   Procedure: Left Heart Cath and Cors/Grafts Angiography;  Surgeon: Nelva Bush, MD;  Location: [facility];  Service: Cardiovascular;  Laterality: N/A;  . CARDIOVASCULAR STRESS TEST  05/06/2014  . CATARACT EXTRACTION W/ INTRAOCULAR LENS IMPLANT Left   . CORONARY ARTERY BYPASS GRAFT N/A 06/22/2014   Procedure: CORONARY ARTERY BYPASS GRAFTING (CABG) x five, using left internal mammary artery, and right leg greater saphenous vein harvested endoscopically;  Surgeon: Melrose Nakayama, MD;  Location: [facility];  Service: Open Heart Surgery;  Laterality: N/A;  . LAPAROSCOPIC CHOLECYSTECTOMY    . MAZE N/A 06/22/2014     Procedure: MAZE;  Surgeon: Melrose Nakayama, MD;  Location: [facility];  Service: Open Heart Surgery;  Laterality: N/A;  . TEE WITHOUT CARDIOVERSION N/A 06/22/2014   Procedure: TRANSESOPHAGEAL ECHOCARDIOGRAM (TEE);  Surgeon: Melrose Nakayama, MD;  Location: [facility];  Service: Open Heart Surgery;  Laterality: N/A;    Family History  Problem Relation Age of Onset  . Alzheimer's disease Mother   . Emphysema Father   . Cancer Brother   . Arrhythmia Sister   . Heart attack Sister   . Heart disease Sister   . Hyperlipidemia Sister   . Hypertension Sister     Allergies  Allergen Reactions  . Lipitor [  Atorvastatin] Other (See Comments)    Myopathy Spring 2006  . Pravastatin Other (See Comments)    LEG CRAMPS  . Simvastatin Other (See Comments)    LEG CRAMPS    Current Outpatient Medications on File Prior to Visit  Medication Sig Dispense Refill  . albuterol (PROVENTIL HFA;VENTOLIN HFA) 108 (90 Base) MCG/ACT inhaler Inhale 2 puffs into the lungs every 4 (four) hours as needed for wheezing or shortness of breath (cough, shortness of breath or wheezing.). 1 Inhaler 1  . aspirin 81 MG tablet Take 81 mg by mouth daily.    . carvedilol (COREG) 6.25 MG tablet TAKE 1 TABLET BY MOUTH TWICE A DAY WITH FOOD 180 tablet 0  . dicyclomine (BENTYL) 20 MG tablet Take 20 mg 2 (two) times daily by mouth.    Arne [place] 5 MG TABS tablet TAKE 1 TABLET BY MOUTH TWICE A DAY 60 tablet 5  . fluorometholone (FML) 0.1 % ophthalmic suspension Place 1 drop into the right eye daily as needed (for eye redness and itching).     . fluticasone (FLONASE) 50 MCG/ACT nasal spray SPRAY ONCE INTO EACH NOSTRIL AS NEEDED FOR ALLERGIES OR RHINITIS DAILY 16 g 2  . insulin NPH Human (NOVOLIN N RELION) 100 UNIT/ML injection Inject 38 units into the skin every morning and 40 units into the skin in the evening. 10 mL 11  . Insulin Pen Needle 31G X 8 MM MISC Use as directed with Lantus. 100 each 5  . insulin regular (NOVOLIN R  RELION) 100 units/mL injection Inject 25 units into the skin twice daily with a smaller meal. Inject 32 units into the skin twice daily with a larger meal or if you have dessert. 10 mL 11  . isosorbide mononitrate (IMDUR) 30 MG 24 hr tablet Take 1 tablet (30 mg total) by mouth daily. Please keep upcoming appt in January for future refills. Thank you 90 tablet 0  . lisinopril (PRINIVIL,ZESTRIL) 5 MG tablet TAKE 1 TABLET (5 MG TOTAL) DAILY BY MOUTH. 90 tablet 0  . MAGNESIUM PO Take 1 tablet by mouth daily as needed (for leg cramping).     . nitroGLYCERIN (NITROSTAT) 0.4 MG SL tablet Place 1 tablet (0.4 mg total) under the tongue every 5 (five) minutes as needed for chest pain. 25 tablet 3  . rosuvastatin (CRESTOR) 10 MG tablet Take 1 tablet (10 mg total) by mouth daily. Please keep upcoming appt in January for future refills. Thank you 90 tablet 0  . tamsulosin (FLOMAX) 0.4 MG CAPS capsule TAKE 1 CAPSULE (0.4 MG TOTAL) BY MOUTH AS NEEDED. FOR DIFFICULTY URINATING. 90 capsule 1  . traMADol (ULTRAM) 50 MG tablet Take 1 tablet (50 mg total) by mouth every 12 (twelve) hours as needed for severe pain. 14 tablet 0   No current facility-administered medications on file prior to visit.     BP 130/74 (BP Location: Left Arm, Patient Position: Sitting, Cuff Size: Normal)   Pulse 73   Temp 98.2 F (36.8 C) (Oral)   Wt 207 lb 8 oz (94.1 kg)   SpO2 97%   BMI 32.02 kg/m    Objective:   Physical Exam  Constitutional: He appears well-nourished. He does not appear ill.  HENT:  Right Ear: Tympanic membrane and ear canal normal.  Left Ear: Tympanic membrane and ear canal normal.  Nose: No mucosal edema. Right sinus exhibits no maxillary sinus tenderness and no frontal sinus tenderness. Left sinus exhibits no maxillary sinus tenderness and no frontal sinus  tenderness.  Mouth/Throat: Oropharynx is clear and moist.  Neck: Neck supple.  Cardiovascular: Normal rate and regular rhythm.  Respiratory: Effort  normal and breath sounds normal. He has no wheezes.  Skin: Skin is warm and dry.           Assessment & Plan:

## 2018-01-16 NOTE — Assessment & Plan Note (Signed)
Exam today with clear lungs, normal vitals. He appears tired but not sickly. Suspect post infectious cough, discussed that this may last up to four weeks after treatment. Rx for Ladona Ridgel sent to pharmacy.  Fluids, rest, follow up PRN.

## 2018-01-16 NOTE — Patient Instructions (Signed)
You cough should be improving over the next several weeks. Please call me if you develop fevers and/or if you cough does not improve.  You may take Benzonatate capsules for cough. Take 1 capsule by mouth three times daily as needed for cough.  Continue your current diabetes regimen for now. It's very important that you start exercising everyday and work on your diet.  Please schedule a follow up appointment in 3 months for diabetes check.  It was a pleasure to see you today!   Diabetes Mellitus and Nutrition, Adult When you have diabetes (diabetes mellitus), it is very important to have healthy eating habits because your blood sugar (glucose) levels are greatly affected by what you eat and drink. Eating healthy foods in the appropriate amounts, at about the same times every day, can help you:  Control your blood glucose.  Lower your risk of heart disease.  Improve your blood pressure.  Reach or maintain a healthy weight. Every person with diabetes is different, and each person has different needs for a meal plan. Your health care provider may recommend that you work with a diet and nutrition specialist (dietitian) to make a meal plan that is best for you. Your meal plan may vary depending on factors such as:  The calories you need.  The medicines you take.  Your weight.  Your blood glucose, blood pressure, and cholesterol levels.  Your activity level.  Other health conditions you have, such as heart or kidney disease. How do carbohydrates affect me? Carbohydrates, also called carbs, affect your blood glucose level more than any other type of food. Eating carbs naturally raises the amount of glucose in your blood. Carb counting is a method for keeping track of how many carbs you eat. Counting carbs is important to keep your blood glucose at a healthy level, especially if you use insulin or take certain oral diabetes medicines. It is important to know how many carbs you can safely  have in each meal. This is different for every person. Your dietitian can help you calculate how many carbs you should have at each meal and for each snack. Foods that contain carbs include:  Bread, cereal, rice, pasta, and crackers.  Potatoes and corn.  Peas, beans, and lentils.  Milk and yogurt.  Fruit and juice.  Desserts, such as cakes, cookies, ice cream, and candy. How does alcohol affect me? Alcohol can cause a sudden decrease in blood glucose (hypoglycemia), especially if you use insulin or take certain oral diabetes medicines. Hypoglycemia can be a life-threatening condition. Symptoms of hypoglycemia (sleepiness, dizziness, and confusion) are similar to symptoms of having too much alcohol. If your health care provider says that alcohol is safe for you, follow these guidelines:  Limit alcohol intake to no more than 1 drink per day for nonpregnant women and 2 drinks per day for men. One drink equals 12 oz of beer, 5 oz of wine, or 1 oz of hard liquor.  Do not drink on an empty stomach.  Keep yourself hydrated with water, diet soda, or unsweetened iced tea.  Keep in mind that regular soda, juice, and other mixers may contain a lot of sugar and must be counted as carbs. What are tips for following this plan?  Reading food labels  Start by checking the serving size on the "Nutrition Facts" label of packaged foods and drinks. The amount of calories, carbs, fats, and other nutrients listed on the label is based on one serving of the item. Many  items contain more than one serving per package.  Check the total grams (g) of carbs in one serving. You can calculate the number of servings of carbs in one serving by dividing the total carbs by 15. For example, if a food has 30 g of total carbs, it would be equal to 2 servings of carbs.  Check the number of grams (g) of saturated and trans fats in one serving. Choose foods that have low or no amount of these fats.  Check the number of  milligrams (mg) of salt (sodium) in one serving. Most people should limit total sodium intake to less than 2,300 mg per day.  Always check the nutrition information of foods labeled as "low-fat" or "nonfat". These foods may be higher in added sugar or refined carbs and should be avoided.  Talk to your dietitian to identify your daily goals for nutrients listed on the label. Shopping  Avoid buying canned, premade, or processed foods. These foods tend to be high in fat, sodium, and added sugar.  Shop around the outside edge of the grocery store. This includes fresh fruits and vegetables, bulk grains, fresh meats, and fresh dairy. Cooking  Use low-heat cooking methods, such as baking, instead of high-heat cooking methods like deep frying.  Cook using healthy oils, such as olive, canola, or sunflower oil.  Avoid cooking with butter, cream, or high-fat meats. Meal planning  Eat meals and snacks regularly, preferably at the same times every day. Avoid going long periods of time without eating.  Eat foods high in fiber, such as fresh fruits, vegetables, beans, and whole grains. Talk to your dietitian about how many servings of carbs you can eat at each meal.  Eat 4-6 ounces (oz) of lean protein each day, such as lean meat, chicken, fish, eggs, or tofu. One oz of lean protein is equal to: ? 1 oz of meat, chicken, or fish. ? 1 egg. ?  cup of tofu.  Eat some foods each day that contain healthy fats, such as avocado, nuts, seeds, and fish. Lifestyle  Check your blood glucose regularly.  Exercise regularly as told by your health care provider. This may include: ? 150 minutes of moderate-intensity or vigorous-intensity exercise each week. This could be brisk walking, biking, or water aerobics. ? Stretching and doing strength exercises, such as yoga or weightlifting, at least 2 times a week.  Take medicines as told by your health care provider.  Do not use any products that contain nicotine  or tobacco, such as cigarettes and e-cigarettes. If you need help quitting, ask your health care provider.  Work with a Social worker or diabetes educator to identify strategies to manage stress and any emotional and social challenges. Questions to ask a health care provider  Do I need to meet with a diabetes educator?  Do I need to meet with a dietitian?  What number can I call if I have questions?  When are the best times to check my blood glucose? Where to find more information:  American Diabetes Association: diabetes.org  Academy of Nutrition and Dietetics: www.eatright.[employer] of Diabetes and Digestive and Kidney Diseases (NIH): [employer] Summary  A healthy meal plan will help you control your blood glucose and maintain a healthy lifestyle.  Working with a diet and nutrition specialist (dietitian) can help you make a meal plan that is best for you.  Keep in mind that carbohydrates (carbs) and alcohol have immediate effects on your blood glucose levels. It is important  to count carbs and to use alcohol carefully. This information is not intended to replace advice given to you by your health care provider. Make sure you discuss any questions you have with your health care provider. Document Released: 09/15/2004 Document Revised: 07/19/2016 Document Reviewed: 01/24/2016 Elsevier Interactive Patient Education  2019 [employer].

## 2018-01-19 ENCOUNTER — Other Ambulatory Visit: Payer: Self-pay | Admitting: Interventional Cardiology

## 2018-01-31 ENCOUNTER — Other Ambulatory Visit: Payer: Self-pay | Admitting: Interventional Cardiology

## 2018-02-28 ENCOUNTER — Other Ambulatory Visit: Payer: Self-pay | Admitting: Interventional Cardiology

## 2018-03-18 DIAGNOSIS — H16142 Punctate keratitis, left eye: Secondary | ICD-10-CM | POA: Diagnosis not present

## 2018-03-29 ENCOUNTER — Telehealth: Payer: Self-pay | Admitting: Primary Care

## 2018-03-29 NOTE — Telephone Encounter (Signed)
That's fine. Can reschedule but he will need a lab appointment after April 15

## 2018-03-29 NOTE — Telephone Encounter (Signed)
Patient called to cancel his appointment on 04/18/18 for follow up diabetes.  Patient said he'd like to change it until the first of June.  He doesn't have video capability.  Patient said he's not having any problems.

## 2018-04-01 ENCOUNTER — Other Ambulatory Visit: Payer: Self-pay | Admitting: Primary Care

## 2018-04-01 DIAGNOSIS — Z794 Long term (current) use of insulin: Principal | ICD-10-CM

## 2018-04-01 DIAGNOSIS — E1159 Type 2 diabetes mellitus with other circulatory complications: Secondary | ICD-10-CM

## 2018-04-01 MED ORDER — GLYBURIDE-METFORMIN 2.5-500 MG PO TABS: 1.0000 | ORAL_TABLET | Freq: Every day | ORAL | 0 refills | Status: DC

## 2018-04-01 NOTE — Telephone Encounter (Signed)
I need some additional information:  How much glyburide is he taking (i.e. dose)? How often is he taking the glyburide, once daily? How much metformin is he taking including dose and frequency?  We do need an A1c before his visit with me if possible.  Have him come by the [facility] lab on April 15 for A1c check.  We will just do the point-of-care A1c check.

## 2018-04-01 NOTE — Telephone Encounter (Signed)
Pt has web appointment with you 4/16 he wanted to know if you could call him in some  Glyburide metformin 534m.  He has been taking this along with insulin shot.  He takes his insulin shot in am and pm and the glyburide metformin  About 4.  He stated this has helped in blood sugars a lot.  They have been running  Between 140 down to  80 for the last 4 days.  He stated when it gets around 125 he gets shaky. And when it gets to 80 he know to eat something real quick.  [facility]  Best number [phone]

## 2018-04-01 NOTE — Telephone Encounter (Signed)
Noted, refill sent to pharmacy. 

## 2018-04-01 NOTE — Telephone Encounter (Signed)
Patient is taking glyburide-metformin (GLUCOVANCE) 2.5-500 mg and he is taking tablet once daily.  Lab appt on 04/17/2018

## 2018-04-17 ENCOUNTER — Other Ambulatory Visit: Payer: Self-pay

## 2018-04-17 ENCOUNTER — Other Ambulatory Visit (INDEPENDENT_AMBULATORY_CARE_PROVIDER_SITE_OTHER): Payer: Medicare Other

## 2018-04-17 DIAGNOSIS — Z794 Long term (current) use of insulin: Secondary | ICD-10-CM | POA: Diagnosis not present

## 2018-04-17 DIAGNOSIS — E1159 Type 2 diabetes mellitus with other circulatory complications: Secondary | ICD-10-CM

## 2018-04-17 LAB — POCT GLYCOSYLATED HEMOGLOBIN (HGB A1C): Hemoglobin A1C: 9.4 % — AB (ref 4.0–5.6)

## 2018-04-18 ENCOUNTER — Encounter: Payer: Self-pay | Admitting: Primary Care

## 2018-04-18 ENCOUNTER — Telehealth: Payer: Self-pay | Admitting: Primary Care

## 2018-04-18 ENCOUNTER — Ambulatory Visit (INDEPENDENT_AMBULATORY_CARE_PROVIDER_SITE_OTHER): Payer: Medicare Other | Admitting: Primary Care

## 2018-04-18 VITALS — BP 131/70 | HR 70

## 2018-04-18 DIAGNOSIS — E1159 Type 2 diabetes mellitus with other circulatory complications: Secondary | ICD-10-CM

## 2018-04-18 DIAGNOSIS — Z794 Long term (current) use of insulin: Secondary | ICD-10-CM

## 2018-04-18 MED ORDER — DULAGLUTIDE 0.75 MG/0.5ML ~~LOC~~ SOAJ: 0.7500 mg | SUBCUTANEOUS | 0 refills | Status: DC

## 2018-04-18 NOTE — Telephone Encounter (Signed)
Patient went to [facility]. He was told Trulicity would cost over $400.  Patient said the pharmacy will send a prior authorization to be filled out and sent back.

## 2018-04-18 NOTE — Patient Instructions (Signed)
Start Trulicity 2.20 mg once weekly for diabetes. Inject this once weekly into the skin.  Continue the Novoling N 40 units in the morning and 45 units in the evening.  Continue Novolin R 20 units twice daily before breakfast and supper.  Start checking your blood sugars before every meal, 2 hours after every meal, and at bedtime. Write down your readings and send them to me in three weeks.  Continue glyburide-metformin.   It was a pleasure to speak with you today!

## 2018-04-18 NOTE — Assessment & Plan Note (Signed)
Recent A1C unchanged from 9.4 in January 2020, he did start glyburide-metformin three weeks ago.  We discussed options and he agrees to start Trulicity 9.98 mg weekly with a potential goal of 1.5 mg weekly. We will continue his Novolin N at 40 units in the AM and 45 units in the PM, and his Novolin R at 20 units BID.  We also discussed for him to start rotating times of glucose checks, start checking before meals and 2 hours after meals so we can learn what effect the Novolin R has on his body. We may need to increase the R vs N.  Foot exam next visit. Pneumonia vaccination UTD. Eye exam UTD. Managed on statin and ACE.  We will have him send [place] some readings in 2-4 weeks.

## 2018-04-18 NOTE — Progress Notes (Signed)
Subjective:    Patient ID: [name], male    DOB: [birth year], 74 y.o.   MRN: [id number]  HPI     [name] - 72 y.o. male  MRN [id number]  Date of Birth: Nov 29, 1945  PCP: [name], NP  This service was provided via telemedicine. Phone Visit performed on 04/18/2018    Rationale for phone visit along with limitations reviewed. Patient consented to telephone encounter.    Location of patient: Home Location of provider: Office at [employer] @ [facility] Name of referring provider: N/A   Names of persons and role in encounter: Provider: [name], NP  Patient: [name]  Other: N/A   Time on call: 14 min - 37 sec   Subjective: CC: Follow up for diabetes HPI:  [name] is a 73 year old male who presents today for follow up of diabetes.  Current medications include: glyburide-metformin 2.5-500 mg daily, Novolin N 38 units in the AM, 40 units in the PM; Novolin R 25 units with smaller meals, 32 units with larger meals.   He cut his Novolin R back to 20 units BID on his own. He's increased his Novolin N to 40 in the AM and 45 in the PM.    He is checking his blood glucose 2 times daily and is getting readings of:  AM fasting: 182 this morning low 100's-low 200's.  Before Dinner: 150-220's  He will have drops in glucose when playing golf three times weekly, numbers of 70-80's.   Last A1C: 9.4 in mid January 2020, 9.4 on recent labs Last Eye Exam: Completed in February 2020 Last Foot Exam: Due next visit Pneumonia Vaccination: Completed in 2018 ACE/ARB: Lisinopril  Statin: Rosuvastatin  Diet currently consists of:  Breakfast: Frozen sausage/egg/cheese biscuit  Lunch: Skips Dinner: Meat, vegetable, starch, beans Snacks: Candy bars Desserts: Several times weekly  Beverages: Water, diet soda   Exercise: Playing golf occasionally, no other exercise.     Objective/Observations:   No physical exam or vital signs collected unless  specifically identified below.   BP 131/70   Pulse 70    Respiratory status: speaks in complete sentences without evident shortness of breath.   Assessment/Plan:  No problem-specific Assessment & Plan notes found for this encounter.   I discussed the assessment and treatment plan with the patient. The patient was provided an opportunity to ask questions and all were answered. The patient agreed with the plan and demonstrated an understanding of the instructions.  Lab Orders  No laboratory test(s) ordered today    Meds ordered this encounter  Medications  . Dulaglutide (TRULICITY) 3.64 WO/0.3OZ SOPN    Sig: Inject 0.75 mg into the skin once a week.    Dispense:  2 mL    Refill:  0    Order Specific Question:   Supervising Provider    Answer:   Diona Browner, AMY E [[id number]]    The patient was advised to call back or seek an in-person evaluation if the symptoms worsen or if the condition fails to improve as anticipated.  [name], NP    Review of Systems  Eyes: Negative for visual disturbance.  Respiratory: Negative for shortness of breath.   Cardiovascular: Negative for chest pain.  Neurological: Negative for dizziness and headaches.       Past Medical History:  Diagnosis Date  . Arthritis    "fingers, shoulders" (08/03/2015)  . Atrial fibrillation (HCC)    Paroxysmal, rare  episodes, sinus rhythm on flecainide, patient prefers not to take Coumadin  . Bradycardia    April, 2013  . Chest pain    Nuclear, 2006, no ischemia  . Chronic lower back pain    "all my life"  . Coronary artery disease    s/p staged cath 05/12/2014 and 5/11, DES x 2 to heavily calcified RCA, residual with 60% prox LAD, 70% D1  . Ejection fraction    EF 55-60%,  echo, January, 2011  . Elevated CPK    CPK elevated with normal MB and normal troponin the past  . Heart murmur   . History of echocardiogram    Echo 8/16: EF 55%, normal wall motion, grade 2 diastolic dysfunction, trivial MR,  normal RV function, PASP 27 mmHg  . Hypertension   . IBS (irritable bowel syndrome)   . Myocardial infarction ([place]) 06/2014  . Sinus drainage   . Type II diabetes mellitus ([place])      Social History   Socioeconomic History  . Marital status: Married    Spouse name: Not on file  . Number of children: Not on file  . Years of education: Not on file  . Highest education level: Not on file  Occupational History  . Not on file  Social Needs  . Financial resource strain: Not on file  . Food insecurity:    Worry: Not on file    Inability: Not on file  . Transportation needs:    Medical: Not on file    Non-medical: Not on file  Tobacco Use  . Smoking status: Never Smoker  . Smokeless tobacco: Never Used  Substance and Sexual Activity  . Alcohol use: No  . Drug use: No  . Sexual activity: Not on file  Lifestyle  . Physical activity:    Days per week: Not on file    Minutes per session: Not on file  . Stress: Not on file  Relationships  . Social connections:    Talks on phone: Not on file    Gets together: Not on file    Attends religious service: Not on file    Active member of club or organization: Not on file    Attends meetings of clubs or organizations: Not on file    Relationship status: Not on file  . Intimate partner violence:    Fear of current or ex partner: Not on file    Emotionally abused: Not on file    Physically abused: Not on file    Forced sexual activity: Not on file  Other Topics Concern  . Not on file  Social History Narrative  . Not on file    Past Surgical History:  Procedure Laterality Date  . APPENDECTOMY    . CARDIAC CATHETERIZATION N/A 05/12/2014   Procedure: Left Heart Cath and Coronary Angiography;  Surgeon: Troy Sine, MD;  Location: [place] [facility];  Service: Cardiovascular;  Laterality: N/A;  . CARDIAC CATHETERIZATION N/A 05/13/2014   Procedure: Coronary/Graft Atherectomy;  Surgeon: Troy Sine, MD;  Location: [place] [facility];  Service: Cardiovascular;  Laterality: N/A;  . CARDIAC CATHETERIZATION Right 05/13/2014   Procedure: Temporary Pacemaker;  Surgeon: Troy Sine, MD;  Location: [place] [facility];  Service: Cardiovascular;  Laterality: Right;  . CARDIAC CATHETERIZATION N/A 05/13/2014   Procedure: Coronary Stent Intervention;  Surgeon: Troy Sine, MD;  Location: [place] [facility];  Service: Cardiovascular;  Laterality: N/A;  . CARDIAC CATHETERIZATION N/A 06/18/2014  Procedure: Left Heart Cath and Coronary Angiography;  Surgeon: Peter M Martinique, MD;  Location: [place] [facility];  Service: Cardiovascular;  Laterality: N/A;  . CARDIAC CATHETERIZATION N/A 08/03/2015   Procedure: Left Heart Cath and Cors/Grafts Angiography;  Surgeon: Nelva Bush, MD;  Location: [place] [facility];  Service: Cardiovascular;  Laterality: N/A;  . CARDIOVASCULAR STRESS TEST  05/06/2014  . CATARACT EXTRACTION W/ INTRAOCULAR LENS IMPLANT Left   . CORONARY ARTERY BYPASS GRAFT N/A 06/22/2014   Procedure: CORONARY ARTERY BYPASS GRAFTING (CABG) x five, using left internal mammary artery, and right leg greater saphenous vein harvested endoscopically;  Surgeon: Melrose Nakayama, MD;  Location: [place];  Service: Open Heart Surgery;  Laterality: N/A;  . LAPAROSCOPIC CHOLECYSTECTOMY    . MAZE N/A 06/22/2014   Procedure: MAZE;  Surgeon: Melrose Nakayama, MD;  Location: [place];  Service: Open Heart Surgery;  Laterality: N/A;  . TEE WITHOUT CARDIOVERSION N/A 06/22/2014   Procedure: TRANSESOPHAGEAL ECHOCARDIOGRAM (TEE);  Surgeon: Melrose Nakayama, MD;  Location: [place];  Service: Open Heart Surgery;  Laterality: N/A;    Family History  Problem Relation Age of Onset  . Alzheimer's disease Mother   . Emphysema Father   . Cancer Brother   . Arrhythmia Sister   . Heart attack Sister   . Heart disease Sister   . Hyperlipidemia Sister   . Hypertension Sister     Allergies  Allergen Reactions  . Lipitor [Atorvastatin] Other (See  Comments)    Myopathy Spring 2006  . Pravastatin Other (See Comments)    LEG CRAMPS  . Simvastatin Other (See Comments)    LEG CRAMPS    Current Outpatient Medications on File Prior to Visit  Medication Sig Dispense Refill  . albuterol (PROVENTIL HFA;VENTOLIN HFA) 108 (90 Base) MCG/ACT inhaler Inhale 2 puffs into the lungs every 4 (four) hours as needed for wheezing or shortness of breath (cough, shortness of breath or wheezing.). 1 Inhaler 1  . aspirin 81 MG tablet Take 81 mg by mouth daily.    . benzonatate (TESSALON) 200 MG capsule Take 1 capsule (200 mg total) by mouth 3 (three) times daily as needed for cough. Do not bite pill 30 capsule 0  . carvedilol (COREG) 6.25 MG tablet TAKE 1 TABLET BY MOUTH TWICE A DAY WITH FOOD 180 tablet 0  . dicyclomine (BENTYL) 20 MG tablet Take 20 mg 2 (two) times daily by mouth.    Arne Cleveland 5 MG TABS tablet TAKE 1 TABLET BY MOUTH TWICE A DAY 60 tablet 5  . fluorometholone (FML) 0.1 % ophthalmic suspension Place 1 drop into the right eye daily as needed (for eye redness and itching).     . fluticasone (FLONASE) 50 MCG/ACT nasal spray SPRAY ONCE INTO EACH NOSTRIL AS NEEDED FOR ALLERGIES OR RHINITIS DAILY 16 g 2  . glyBURIDE-metformin (GLUCOVANCE) 2.5-500 MG tablet Take 1 tablet by mouth daily with breakfast. For diabetes. 90 tablet 0  . insulin NPH Human (NOVOLIN N RELION) 100 UNIT/ML injection Inject 38 units into the skin every morning and 40 units into the skin in the evening. 10 mL 11  . Insulin Pen Needle 31G X 8 MM MISC Use as directed with Lantus. 100 each 5  . insulin regular (NOVOLIN R RELION) 100 units/mL injection Inject 25 units into the skin twice daily with a smaller meal. Inject 32 units into the skin twice daily with a larger meal or if you have dessert. 10 mL 11  .  isosorbide mononitrate (IMDUR) 30 MG 24 hr tablet Take 1 tablet (30 mg total) by mouth daily. 90 tablet 3  . lisinopril (PRINIVIL,ZESTRIL) 5 MG tablet TAKE 1 TABLET (5 MG TOTAL)  DAILY BY MOUTH. 90 tablet 3  . MAGNESIUM PO Take 1 tablet by mouth daily as needed (for leg cramping).     . nitroGLYCERIN (NITROSTAT) 0.4 MG SL tablet Place 1 tablet (0.4 mg total) under the tongue every 5 (five) minutes as needed for chest pain. 25 tablet 3  . rosuvastatin (CRESTOR) 10 MG tablet Take 1 tablet (10 mg total) by mouth daily. 90 tablet 3  . tamsulosin (FLOMAX) 0.4 MG CAPS capsule TAKE 1 CAPSULE (0.4 MG TOTAL) BY MOUTH AS NEEDED. FOR DIFFICULTY URINATING. 90 capsule 1  . traMADol (ULTRAM) 50 MG tablet Take 1 tablet (50 mg total) by mouth every 12 (twelve) hours as needed for severe pain. 14 tablet 0   No current facility-administered medications on file prior to visit.     BP 131/70   Pulse 70    Objective:   Physical Exam  Constitutional: He is oriented to person, place, and time.  Respiratory: Effort normal.  Neurological: He is alert and oriented to person, place, and time.  Psychiatric: He has a normal mood and affect.           Assessment & Plan:

## 2018-04-19 NOTE — Telephone Encounter (Signed)
Patient is following up on a message that was sent back yesterday in regards to his Trulicity   He is requesting a call back

## 2018-04-22 NOTE — Telephone Encounter (Signed)
Best number [phone] Pt called checking on his trulicity prior autho Please let pt know when this is done

## 2018-04-22 NOTE — Telephone Encounter (Signed)
What's the status on this?

## 2018-04-22 NOTE — Telephone Encounter (Signed)
Pt call back he was a little upset no one has called him back about his rx

## 2018-04-23 NOTE — Telephone Encounter (Signed)
Spoken to patient and stated that this was faxed on 04/19/2018.  Inform patient that I have not heard anything or received anything since 04/19/2018.  Will refax form as requested. Inform patient that I will call with an update as soon as hear back from [facility]

## 2018-04-29 NOTE — Telephone Encounter (Signed)
Please update patient on this. Is he willing to increase his insulin? This would be the next best step. I recommend he increase his Novolin N to 45 units in the morning and keep 45 units in the evening. Increase his Novolin R to 25 units before meals.   Let me know.  I would like a follow up visit with him virtually in one month for diabetes.

## 2018-04-29 NOTE — Telephone Encounter (Signed)
Please advise. Trulicity has been denied  Per fax from [facility]: We denied this request because: This biologic drug is covered at the cost-sharing (co-pay) tier listed in your 2020 Drug Guide. Since there isn't an alternative biologic drug in a lower cost-sharing tier to treat your condition, a tier exception is not permitted.

## 2018-04-30 MED ORDER — INSULIN NPH (HUMAN) (ISOPHANE) 100 UNIT/ML ~~LOC~~ SUSP: SUBCUTANEOUS | 11 refills | Status: DC

## 2018-04-30 NOTE — Telephone Encounter (Signed)
Spoken to patient this morning. He is agreeable to the increase. I have inform patient of the recommendation below.  He stated that he could not get the phone to allow the camera the last time. Patient wants to wait and see if we will be able to do in person in the next month or so.

## 2018-04-30 NOTE — Addendum Note (Signed)
Addended by: [name] on: 04/30/2018 05:28 PM   Modules accepted: Orders

## 2018-04-30 NOTE — Telephone Encounter (Signed)
Noted. Raquel Sarna will you schedule him for an in person visit for one month for diabetes check? Thanks.

## 2018-05-22 ENCOUNTER — Other Ambulatory Visit: Payer: Self-pay | Admitting: Interventional Cardiology

## 2018-05-28 ENCOUNTER — Encounter: Payer: Self-pay | Admitting: Primary Care

## 2018-05-28 ENCOUNTER — Other Ambulatory Visit: Payer: Self-pay

## 2018-05-28 ENCOUNTER — Ambulatory Visit (INDEPENDENT_AMBULATORY_CARE_PROVIDER_SITE_OTHER): Payer: Medicare Other | Admitting: Primary Care

## 2018-05-28 DIAGNOSIS — I25118 Atherosclerotic heart disease of native coronary artery with other forms of angina pectoris: Secondary | ICD-10-CM

## 2018-05-28 DIAGNOSIS — Z794 Long term (current) use of insulin: Secondary | ICD-10-CM

## 2018-05-28 DIAGNOSIS — E1159 Type 2 diabetes mellitus with other circulatory complications: Secondary | ICD-10-CM

## 2018-05-28 NOTE — Progress Notes (Signed)
Subjective:    Patient ID: [name], male    DOB: [birth year], 73 y.o.   MRN: [id number]  HPI  [name] is a 73 year old male who presents today for follow up of diabetes.  Current medications include: glyburide-metformin 9.7-353 mg BID, Trulicity 2.99 mg weekly, Relion N 45 units BID, Relion R 25 units TID with meals.   He is checking his blood glucose 2 times daily and is getting readings of:  AM fasting: 85-175, 170 this morning Before dinner: 100-170  Highest reading: 220 Lowest reading: 75  He is still feeling "shaky" in the mornings while playing golf. He typically walks 18 holes when playing. He will inject both his NPH and Regular insulin on golf days. When feeling shaky he will eat a candy bar with improvement. This will occur three days weekly on average.   Last A1C: 9.4 in mid April 2020 Last Eye Exam: Due in February 2021 Last Foot Exam: Due Pneumonia Vaccination: Completed in 2018 ACE/ARB: Lisinopril  Statin: Crestor  Diet currently consists of:  Breakfast: Small meal, frozen breakfast bowl Lunch: Sandwich, meat/vegetable/starch Dinner: Meat, vegetable, starch Snacks: Candy bars, crackers  Desserts: Daily  Beverages: Diet soda, water   Exercise: Playing golf 1-3 times weekly    Review of Systems  Respiratory: Negative for shortness of breath.   Cardiovascular: Negative for chest pain.  Neurological: Negative for dizziness, numbness and headaches.       Past Medical History:  Diagnosis Date  . Arthritis    "fingers, shoulders" (08/03/2015)  . Atrial fibrillation (HCC)    Paroxysmal, rare episodes, sinus rhythm on flecainide, patient prefers not to take Coumadin  . Bradycardia    April, 2013  . Chest pain    Nuclear, 2006, no ischemia  . Chronic lower back pain    "all my life"  . Coronary artery disease    s/p staged cath 05/12/2014 and 5/11, DES x 2 to heavily calcified RCA, residual with 60% prox LAD, 70% D1  . Ejection fraction    EF  55-60%,  echo, January, 2011  . Elevated CPK    CPK elevated with normal MB and normal troponin the past  . Heart murmur   . History of echocardiogram    Echo 8/16: EF 55%, normal wall motion, grade 2 diastolic dysfunction, trivial MR, normal RV function, PASP 27 mmHg  . Hypertension   . IBS (irritable bowel syndrome)   . Myocardial infarction ([place]) 06/2014  . Sinus drainage   . Type II diabetes mellitus ([place])      Social History   Socioeconomic History  . Marital status: Married    Spouse name: Not on file  . Number of children: Not on file  . Years of education: Not on file  . Highest education level: Not on file  Occupational History  . Not on file  Social Needs  . Financial resource strain: Not on file  . Food insecurity:    Worry: Not on file    Inability: Not on file  . Transportation needs:    Medical: Not on file    Non-medical: Not on file  Tobacco Use  . Smoking status: Never Smoker  . Smokeless tobacco: Never Used  Substance and Sexual Activity  . Alcohol use: No  . Drug use: No  . Sexual activity: Not on file  Lifestyle  . Physical activity:    Days per week: Not on file    Minutes per session: Not  on file  . Stress: Not on file  Relationships  . Social connections:    Talks on phone: Not on file    Gets together: Not on file    Attends religious service: Not on file    Active member of club or organization: Not on file    Attends meetings of clubs or organizations: Not on file    Relationship status: Not on file  . Intimate partner violence:    Fear of current or ex partner: Not on file    Emotionally abused: Not on file    Physically abused: Not on file    Forced sexual activity: Not on file  Other Topics Concern  . Not on file  Social History Narrative  . Not on file    Past Surgical History:  Procedure Laterality Date  . APPENDECTOMY    . CARDIAC CATHETERIZATION N/A 05/12/2014   Procedure: Left Heart Cath and Coronary Angiography;   Surgeon: Troy Sine, MD;  Location: [place] [facility];  Service: Cardiovascular;  Laterality: N/A;  . CARDIAC CATHETERIZATION N/A 05/13/2014   Procedure: Coronary/Graft Atherectomy;  Surgeon: Troy Sine, MD;  Location: [place] [facility];  Service: Cardiovascular;  Laterality: N/A;  . CARDIAC CATHETERIZATION Right 05/13/2014   Procedure: Temporary Pacemaker;  Surgeon: Troy Sine, MD;  Location: [place] [facility];  Service: Cardiovascular;  Laterality: Right;  . CARDIAC CATHETERIZATION N/A 05/13/2014   Procedure: Coronary Stent Intervention;  Surgeon: Troy Sine, MD;  Location: [place] [facility];  Service: Cardiovascular;  Laterality: N/A;  . CARDIAC CATHETERIZATION N/A 06/18/2014   Procedure: Left Heart Cath and Coronary Angiography;  Surgeon: Peter M Martinique, MD;  Location: [place] [facility];  Service: Cardiovascular;  Laterality: N/A;  . CARDIAC CATHETERIZATION N/A 08/03/2015   Procedure: Left Heart Cath and Cors/Grafts Angiography;  Surgeon: Nelva Bush, MD;  Location: [place] [facility];  Service: Cardiovascular;  Laterality: N/A;  . CARDIOVASCULAR STRESS TEST  05/06/2014  . CATARACT EXTRACTION W/ INTRAOCULAR LENS IMPLANT Left   . CORONARY ARTERY BYPASS GRAFT N/A 06/22/2014   Procedure: CORONARY ARTERY BYPASS GRAFTING (CABG) x five, using left internal mammary artery, and right leg greater saphenous vein harvested endoscopically;  Surgeon: Melrose Nakayama, MD;  Location: [place];  Service: Open Heart Surgery;  Laterality: N/A;  . LAPAROSCOPIC CHOLECYSTECTOMY    . MAZE N/A 06/22/2014   Procedure: MAZE;  Surgeon: Melrose Nakayama, MD;  Location: [place];  Service: Open Heart Surgery;  Laterality: N/A;  . TEE WITHOUT CARDIOVERSION N/A 06/22/2014   Procedure: TRANSESOPHAGEAL ECHOCARDIOGRAM (TEE);  Surgeon: Melrose Nakayama, MD;  Location: [place];  Service: Open Heart Surgery;  Laterality: N/A;    Family History  Problem Relation Age of Onset  . Alzheimer's disease Mother    . Emphysema Father   . Cancer Brother   . Arrhythmia Sister   . Heart attack Sister   . Heart disease Sister   . Hyperlipidemia Sister   . Hypertension Sister     Allergies  Allergen Reactions  . Lipitor [Atorvastatin] Other (See Comments)    Myopathy Spring 2006  . Pravastatin Other (See Comments)    LEG CRAMPS  . Simvastatin Other (See Comments)    LEG CRAMPS    Current Outpatient Medications on File Prior to Visit  Medication Sig Dispense Refill  . albuterol (PROVENTIL HFA;VENTOLIN HFA) 108 (90 Base) MCG/ACT inhaler Inhale 2 puffs into the lungs every 4 (four) hours as needed  for wheezing or shortness of breath (cough, shortness of breath or wheezing.). 1 Inhaler 1  . aspirin 81 MG tablet Take 81 mg by mouth daily.    . carvedilol (COREG) 6.25 MG tablet TAKE 1 TABLET BY MOUTH TWICE A DAY WITH FOOD 180 tablet 3  . dicyclomine (BENTYL) 20 MG tablet Take 20 mg 2 (two) times daily by mouth.    Arne Cleveland 5 MG TABS tablet TAKE 1 TABLET BY MOUTH TWICE A DAY 60 tablet 5  . fluorometholone (FML) 0.1 % ophthalmic suspension Place 1 drop into the right eye daily as needed (for eye redness and itching).     . fluticasone (FLONASE) 50 MCG/ACT nasal spray SPRAY ONCE INTO EACH NOSTRIL AS NEEDED FOR ALLERGIES OR RHINITIS DAILY 16 g 2  . glyBURIDE-metformin (GLUCOVANCE) 2.5-500 MG tablet Take 1 tablet by mouth daily with breakfast. For diabetes. 90 tablet 0  . insulin NPH Human (NOVOLIN N RELION) 100 UNIT/ML injection Inject 45 units into the skin every morning and 45 units into the skin in the evening. 10 mL 11  . Insulin Pen Needle 31G X 8 MM MISC Use as directed with Lantus. 100 each 5  . insulin regular (NOVOLIN R RELION) 100 units/mL injection Inject 25 units into the skin twice daily with a smaller meal. Inject 32 units into the skin twice daily with a larger meal or if you have dessert. 10 mL 11  . isosorbide mononitrate (IMDUR) 30 MG 24 hr tablet Take 1 tablet (30 mg total) by mouth  daily. 90 tablet 3  . lisinopril (PRINIVIL,ZESTRIL) 5 MG tablet TAKE 1 TABLET (5 MG TOTAL) DAILY BY MOUTH. 90 tablet 3  . MAGNESIUM PO Take 1 tablet by mouth daily as needed (for leg cramping).     . nitroGLYCERIN (NITROSTAT) 0.4 MG SL tablet Place 1 tablet (0.4 mg total) under the tongue every 5 (five) minutes as needed for chest pain. 25 tablet 3  . rosuvastatin (CRESTOR) 10 MG tablet Take 1 tablet (10 mg total) by mouth daily. 90 tablet 3  . tamsulosin (FLOMAX) 0.4 MG CAPS capsule TAKE 1 CAPSULE (0.4 MG TOTAL) BY MOUTH AS NEEDED. FOR DIFFICULTY URINATING. 90 capsule 1  . traMADol (ULTRAM) 50 MG tablet Take 1 tablet (50 mg total) by mouth every 12 (twelve) hours as needed for severe pain. 14 tablet 0   No current facility-administered medications on file prior to visit.     BP 130/78   Pulse 71   Temp 97.9 F (36.6 C) (Oral)   Ht 5' 7.5" (1.715 m)   Wt 217 lb (98.4 kg)   SpO2 97%   BMI 33.49 kg/m    Objective:   Physical Exam  Constitutional: He appears well-nourished.  Neck: Neck supple.  Cardiovascular: Normal rate and regular rhythm.  Respiratory: Effort normal and breath sounds normal.  Skin: Skin is warm and dry.           Assessment & Plan:

## 2018-05-28 NOTE — Patient Instructions (Signed)
Continue Relion N 45 units twice daily, everyday.  Inject 25 units of the Relion R three times daily before meals when eating. Reduce the Relion R by half on mornings that you don't eat much or when playing golf.  Schedule a lab only appointment for on or after July 15th.   We will see you in September 2020 for your annual physical.  It was a pleasure to see you today!   Diabetes Mellitus and Nutrition, Adult When you have diabetes (diabetes mellitus), it is very important to have healthy eating habits because your blood sugar (glucose) levels are greatly affected by what you eat and drink. Eating healthy foods in the appropriate amounts, at about the same times every day, can help you:  Control your blood glucose.  Lower your risk of heart disease.  Improve your blood pressure.  Reach or maintain a healthy weight. Every person with diabetes is different, and each person has different needs for a meal plan. Your health care provider may recommend that you work with a diet and nutrition specialist (dietitian) to make a meal plan that is best for you. Your meal plan may vary depending on factors such as:  The calories you need.  The medicines you take.  Your weight.  Your blood glucose, blood pressure, and cholesterol levels.  Your activity level.  Other health conditions you have, such as heart or kidney disease. How do carbohydrates affect me? Carbohydrates, also called carbs, affect your blood glucose level more than any other type of food. Eating carbs naturally raises the amount of glucose in your blood. Carb counting is a method for keeping track of how many carbs you eat. Counting carbs is important to keep your blood glucose at a healthy level, especially if you use insulin or take certain oral diabetes medicines. It is important to know how many carbs you can safely have in each meal. This is different for every person. Your dietitian can help you calculate how many carbs  you should have at each meal and for each snack. Foods that contain carbs include:  Bread, cereal, rice, pasta, and crackers.  Potatoes and corn.  Peas, beans, and lentils.  Milk and yogurt.  Fruit and juice.  Desserts, such as cakes, cookies, ice cream, and candy. How does alcohol affect me? Alcohol can cause a sudden decrease in blood glucose (hypoglycemia), especially if you use insulin or take certain oral diabetes medicines. Hypoglycemia can be a life-threatening condition. Symptoms of hypoglycemia (sleepiness, dizziness, and confusion) are similar to symptoms of having too much alcohol. If your health care provider says that alcohol is safe for you, follow these guidelines:  Limit alcohol intake to no more than 1 drink per day for nonpregnant women and 2 drinks per day for men. One drink equals 12 oz of beer, 5 oz of wine, or 1 oz of hard liquor.  Do not drink on an empty stomach.  Keep yourself hydrated with water, diet soda, or unsweetened iced tea.  Keep in mind that regular soda, juice, and other mixers may contain a lot of sugar and must be counted as carbs. What are tips for following this plan?  Reading food labels  Start by checking the serving size on the "Nutrition Facts" label of packaged foods and drinks. The amount of calories, carbs, fats, and other nutrients listed on the label is based on one serving of the item. Many items contain more than one serving per package.  Check the total grams (  g) of carbs in one serving. You can calculate the number of servings of carbs in one serving by dividing the total carbs by 15. For example, if a food has 30 g of total carbs, it would be equal to 2 servings of carbs.  Check the number of grams (g) of saturated and trans fats in one serving. Choose foods that have low or no amount of these fats.  Check the number of milligrams (mg) of salt (sodium) in one serving. Most people should limit total sodium intake to less than  2,300 mg per day.  Always check the nutrition information of foods labeled as "low-fat" or "nonfat". These foods may be higher in added sugar or refined carbs and should be avoided.  Talk to your dietitian to identify your daily goals for nutrients listed on the label. Shopping  Avoid buying canned, premade, or processed foods. These foods tend to be high in fat, sodium, and added sugar.  Shop around the outside edge of the grocery store. This includes fresh fruits and vegetables, bulk grains, fresh meats, and fresh dairy. Cooking  Use low-heat cooking methods, such as baking, instead of high-heat cooking methods like deep frying.  Cook using healthy oils, such as olive, canola, or sunflower oil.  Avoid cooking with butter, cream, or high-fat meats. Meal planning  Eat meals and snacks regularly, preferably at the same times every day. Avoid going long periods of time without eating.  Eat foods high in fiber, such as fresh fruits, vegetables, beans, and whole grains. Talk to your dietitian about how many servings of carbs you can eat at each meal.  Eat 4-6 ounces (oz) of lean protein each day, such as lean meat, chicken, fish, eggs, or tofu. One oz of lean protein is equal to: ? 1 oz of meat, chicken, or fish. ? 1 egg. ?  cup of tofu.  Eat some foods each day that contain healthy fats, such as avocado, nuts, seeds, and fish. Lifestyle  Check your blood glucose regularly.  Exercise regularly as told by your health care provider. This may include: ? 150 minutes of moderate-intensity or vigorous-intensity exercise each week. This could be brisk walking, biking, or water aerobics. ? Stretching and doing strength exercises, such as yoga or weightlifting, at least 2 times a week.  Take medicines as told by your health care provider.  Do not use any products that contain nicotine or tobacco, such as cigarettes and e-cigarettes. If you need help quitting, ask your health care provider.   Work with a Social worker or diabetes educator to identify strategies to manage stress and any emotional and social challenges. Questions to ask a health care provider  Do I need to meet with a diabetes educator?  Do I need to meet with a dietitian?  What number can I call if I have questions?  When are the best times to check my blood glucose? Where to find more information:  American Diabetes Association: diabetes.org  Academy of Nutrition and Dietetics: www.eatright.[employer] of Diabetes and Digestive and Kidney Diseases (NIH): [employer] Summary  A healthy meal plan will help you control your blood glucose and maintain a healthy lifestyle.  Working with a diet and nutrition specialist (dietitian) can help you make a meal plan that is best for you.  Keep in mind that carbohydrates (carbs) and alcohol have immediate effects on your blood glucose levels. It is important to count carbs and to use alcohol carefully. This information is not intended  to replace advice given to you by your health care provider. Make sure you discuss any questions you have with your health care provider. Document Released: 09/15/2004 Document Revised: 07/19/2016 Document Reviewed: 01/24/2016 Elsevier Interactive Patient Education  2019 [employer].

## 2018-05-28 NOTE — Assessment & Plan Note (Signed)
Glucose readings improved, commended him on this.  Discussed to cut Regular insulin in half on mornings prior to golf mornings. This is an attempt to prevent him from eating candy bars for symptoms.   Continue current regimen. Repeat A1C in mid July 2020.

## 2018-06-21 ENCOUNTER — Other Ambulatory Visit: Payer: Self-pay

## 2018-06-21 ENCOUNTER — Ambulatory Visit: Payer: Medicare Other | Admitting: Primary Care

## 2018-06-21 ENCOUNTER — Encounter: Payer: Self-pay | Admitting: Primary Care

## 2018-06-21 ENCOUNTER — Telehealth: Payer: Self-pay | Admitting: Primary Care

## 2018-06-21 VITALS — Temp 96.9°F | Wt 217.0 lb

## 2018-06-21 DIAGNOSIS — J309 Allergic rhinitis, unspecified: Secondary | ICD-10-CM

## 2018-06-21 MED ORDER — FEXOFENADINE HCL 180 MG PO TABS: 180.0000 mg | ORAL_TABLET | Freq: Every day | ORAL | 0 refills | Status: DC

## 2018-06-21 MED ORDER — FLUTICASONE PROPIONATE 50 MCG/ACT NA SUSP: 1.0000 | Freq: Two times a day (BID) | NASAL | 0 refills | Status: DC | PRN

## 2018-06-21 NOTE — Assessment & Plan Note (Signed)
Sneezing, itchy/watery eyes, post nasal drip, cough x 4 days. Recent rainy weather. No fevers. Feeling well otherwise. Symptoms suggestive of allergic rhinitis. Refill sent of Flonase. Rx for Allegra sent to pharmacy. He will update. Return precautions provided.

## 2018-06-21 NOTE — Patient Instructions (Signed)
Nasal Congestion/Ear Pressure/Sinus Pressure: Try using Flonase (fluticasone) nasal spray. Instill 1 spray in each nostril twice daily.   Start fexofenadine (Allegra) once daily for allergies.  Please notify me if you develop persistent fevers of 101, develop shortness of breath or increased shortness of breath, and/or feel worse after 1 week of onset of symptoms.   It was a pleasure to see you today!

## 2018-06-21 NOTE — Telephone Encounter (Signed)
Patient called office today to schedule appt He has been battling a cough, congestion and coughing up mucus.  Patient stated that he could not wait until Monday and needed to do a virtual as soon as he could.   When is a good time to have him scheduled. Schedules are very full today,

## 2018-06-21 NOTE — Telephone Encounter (Signed)
Please get some additional detail.  How long has he been coughing? Any fevers?

## 2018-06-21 NOTE — Progress Notes (Signed)
Subjective:    Patient ID: [name], male    DOB: 11-25-1945, 73 y.o.   MRN: [id number]  HPI     BEKIM WERNTZ - 73 y.o. male  MRN [id number]  Date of Birth: 1945-06-05  PCP: [name], NP  This service was provided via telemedicine. Phone Visit performed on 06/21/2018    Rationale for phone visit along with limitations reviewed. Patient consented to telephone encounter.    Location of patient: Home Location of provider: Office [place] @ [facility] Name of referring provider: N/A   Names of persons and role in encounter: Provider: [name], NP  Patient: [name]  Other: N/A   Time on call: 4 min - 49 sec   Subjective: Chief Complaint  Patient presents with  . Cough  . Wheezing     HPI:  Mr. Lotts is a 73 year old male with a history of hypertension, CAD, type 2 diabetes who presents today with a chief complaint of cough.  He also endorses "wheezing" when laying down at night, post nasal drip, watery eyes, sneezing, productive cough with yellow sputum. Symptoms began four days ago. He's taking Mucinex cough medication with improvement. Today symptoms are better. He feels well overall.   Objective/Observations:   No physical exam or vital signs collected unless specifically identified below.   Temp 99.6 F (37.6 C) (Oral)   Wt 217 lb (98.4 kg)   BMI 33.49 kg/m    Respiratory status: speaks in complete sentences without evident shortness of breath.   Assessment/Plan:  Sneezing, itchy/watery eyes, post nasal drip, cough x 4 days. Recent rainy weather. No fevers. Feeling well otherwise. Symptoms suggestive of allergic rhinitis. Refill sent of Flonase. Rx for Allegra sent to pharmacy. He will update. Return precautions provided.   No problem-specific Assessment & Plan notes found for this encounter.   I discussed the assessment and treatment plan with the patient. The patient was provided an opportunity to ask questions and  all were answered. The patient agreed with the plan and demonstrated an understanding of the instructions.  Lab Orders  No laboratory test(s) ordered today    No orders of the defined types were placed in this encounter.   The patient was advised to call back or seek an in-person evaluation if the symptoms worsen or if the condition fails to improve as anticipated.  [name], NP    Review of Systems  HENT: Positive for congestion, postnasal drip and sneezing. Negative for sinus pressure and sore throat.   Eyes: Positive for itching.  Respiratory: Positive for cough. Negative for shortness of breath.   Cardiovascular: Negative for chest pain.  Allergic/Immunologic: Positive for environmental allergies.       Past Medical History:  Diagnosis Date  . Arthritis    "fingers, shoulders" (08/03/2015)  . Atrial fibrillation (HCC)    Paroxysmal, rare episodes, sinus rhythm on flecainide, patient prefers not to take Coumadin  . Bradycardia    April, 2013  . Chest pain    Nuclear, 2006, no ischemia  . Chronic lower back pain    "all my life"  . Coronary artery disease    s/p staged cath 05/12/2014 and 5/11, DES x 2 to heavily calcified RCA, residual with 60% prox LAD, 70% D1  . Ejection fraction    EF 55-60%,  echo, January, 2011  . Elevated CPK    CPK elevated with normal MB and normal troponin the past  .  Heart murmur   . History of echocardiogram    Echo 8/16: EF 55%, normal wall motion, grade 2 diastolic dysfunction, trivial MR, normal RV function, PASP 27 mmHg  . Hypertension   . IBS (irritable bowel syndrome)   . Myocardial infarction ([place]) 06/2014  . Sinus drainage   . Type II diabetes mellitus ([place])      Social History   Socioeconomic History  . Marital status: Married    Spouse name: Not on file  . Number of children: Not on file  . Years of education: Not on file  . Highest education level: Not on file  Occupational History  . Not on file  Social  Needs  . Financial resource strain: Not on file  . Food insecurity    Worry: Not on file    Inability: Not on file  . Transportation needs    Medical: Not on file    Non-medical: Not on file  Tobacco Use  . Smoking status: Never Smoker  . Smokeless tobacco: Never Used  Substance and Sexual Activity  . Alcohol use: No  . Drug use: No  . Sexual activity: Not on file  Lifestyle  . Physical activity    Days per week: Not on file    Minutes per session: Not on file  . Stress: Not on file  Relationships  . Social Herbalist on phone: Not on file    Gets together: Not on file    Attends religious service: Not on file    Active member of club or organization: Not on file    Attends meetings of clubs or organizations: Not on file    Relationship status: Not on file  . Intimate partner violence    Fear of current or ex partner: Not on file    Emotionally abused: Not on file    Physically abused: Not on file    Forced sexual activity: Not on file  Other Topics Concern  . Not on file  Social History Narrative  . Not on file    Past Surgical History:  Procedure Laterality Date  . APPENDECTOMY    . CARDIAC CATHETERIZATION N/A 05/12/2014   Procedure: Left Heart Cath and Coronary Angiography;  Surgeon: Troy Sine, MD;  Location: [place] [facility];  Service: Cardiovascular;  Laterality: N/A;  . CARDIAC CATHETERIZATION N/A 05/13/2014   Procedure: Coronary/Graft Atherectomy;  Surgeon: Troy Sine, MD;  Location: [place] [facility];  Service: Cardiovascular;  Laterality: N/A;  . CARDIAC CATHETERIZATION Right 05/13/2014   Procedure: Temporary Pacemaker;  Surgeon: Troy Sine, MD;  Location: [place] [facility];  Service: Cardiovascular;  Laterality: Right;  . CARDIAC CATHETERIZATION N/A 05/13/2014   Procedure: Coronary Stent Intervention;  Surgeon: Troy Sine, MD;  Location: [place] [facility];  Service: Cardiovascular;  Laterality: N/A;  . CARDIAC CATHETERIZATION N/A  06/18/2014   Procedure: Left Heart Cath and Coronary Angiography;  Surgeon: Peter M Martinique, MD;  Location: [place] [facility];  Service: Cardiovascular;  Laterality: N/A;  . CARDIAC CATHETERIZATION N/A 08/03/2015   Procedure: Left Heart Cath and Cors/Grafts Angiography;  Surgeon: Nelva Bush, MD;  Location: [place] [facility];  Service: Cardiovascular;  Laterality: N/A;  . CARDIOVASCULAR STRESS TEST  05/06/2014  . CATARACT EXTRACTION W/ INTRAOCULAR LENS IMPLANT Left   . CORONARY ARTERY BYPASS GRAFT N/A 06/22/2014   Procedure: CORONARY ARTERY BYPASS GRAFTING (CABG) x five, using left internal mammary artery, and right leg greater  saphenous vein harvested endoscopically;  Surgeon: Melrose Nakayama, MD;  Location: [facility];  Service: Open Heart Surgery;  Laterality: N/A;  . LAPAROSCOPIC CHOLECYSTECTOMY    . MAZE N/A 06/22/2014   Procedure: MAZE;  Surgeon: Melrose Nakayama, MD;  Location: [place];  Service: Open Heart Surgery;  Laterality: N/A;  . TEE WITHOUT CARDIOVERSION N/A 06/22/2014   Procedure: TRANSESOPHAGEAL ECHOCARDIOGRAM (TEE);  Surgeon: Melrose Nakayama, MD;  Location: [place];  Service: Open Heart Surgery;  Laterality: N/A;    Family History  Problem Relation Age of Onset  . Alzheimer's disease Mother   . Emphysema Father   . Cancer Brother   . Arrhythmia Sister   . Heart attack Sister   . Heart disease Sister   . Hyperlipidemia Sister   . Hypertension Sister     Allergies  Allergen Reactions  . Lipitor [Atorvastatin] Other (See Comments)    Myopathy Spring 2006  . Pravastatin Other (See Comments)    LEG CRAMPS  . Simvastatin Other (See Comments)    LEG CRAMPS    Current Outpatient Medications on File Prior to Visit  Medication Sig Dispense Refill  . albuterol (PROVENTIL HFA;VENTOLIN HFA) 108 (90 Base) MCG/ACT inhaler Inhale 2 puffs into the lungs every 4 (four) hours as needed for wheezing or shortness of breath (cough, shortness of breath or wheezing.). 1 Inhaler 1   . aspirin 81 MG tablet Take 81 mg by mouth daily.    . carvedilol (COREG) 6.25 MG tablet TAKE 1 TABLET BY MOUTH TWICE A DAY WITH FOOD 180 tablet 3  . dicyclomine (BENTYL) 20 MG tablet Take 20 mg 2 (two) times daily by mouth.    Arne Cleveland 5 MG TABS tablet TAKE 1 TABLET BY MOUTH TWICE A DAY 60 tablet 5  . fluorometholone (FML) 0.1 % ophthalmic suspension Place 1 drop into the right eye daily as needed (for eye redness and itching).     Marland Kitchen glyBURIDE-metformin (GLUCOVANCE) 2.5-500 MG tablet Take 1 tablet by mouth daily with breakfast. For diabetes. 90 tablet 0  . insulin NPH Human (NOVOLIN N RELION) 100 UNIT/ML injection Inject 45 units into the skin every morning and 45 units into the skin in the evening. 10 mL 11  . Insulin Pen Needle 31G X 8 MM MISC Use as directed with Lantus. 100 each 5  . insulin regular (NOVOLIN R RELION) 100 units/mL injection Inject 25 units into the skin twice daily with a smaller meal. Inject 32 units into the skin twice daily with a larger meal or if you have dessert. 10 mL 11  . isosorbide mononitrate (IMDUR) 30 MG 24 hr tablet Take 1 tablet (30 mg total) by mouth daily. 90 tablet 3  . lisinopril (PRINIVIL,ZESTRIL) 5 MG tablet TAKE 1 TABLET (5 MG TOTAL) DAILY BY MOUTH. 90 tablet 3  . MAGNESIUM PO Take 1 tablet by mouth daily as needed (for leg cramping).     . nitroGLYCERIN (NITROSTAT) 0.4 MG SL tablet Place 1 tablet (0.4 mg total) under the tongue every 5 (five) minutes as needed for chest pain. 25 tablet 3  . rosuvastatin (CRESTOR) 10 MG tablet Take 1 tablet (10 mg total) by mouth daily. 90 tablet 3  . tamsulosin (FLOMAX) 0.4 MG CAPS capsule TAKE 1 CAPSULE (0.4 MG TOTAL) BY MOUTH AS NEEDED. FOR DIFFICULTY URINATING. 90 capsule 1  . traMADol (ULTRAM) 50 MG tablet Take 1 tablet (50 mg total) by mouth every 12 (twelve) hours as needed for severe pain. [place]  tablet 0   No current facility-administered medications on file prior to visit.     Temp (!) 96.9 F (36.1 C) (Oral)    Wt 217 lb (98.4 kg)   BMI 33.49 kg/m    Objective:   Physical Exam  Constitutional: He is oriented to person, place, and time.  Sounds well, not sickly  Respiratory: Effort normal. No respiratory distress.  No cough during visit  Neurological: He is alert and oriented to person, place, and time.  Psychiatric: He has a normal mood and affect.           Assessment & Plan:

## 2018-06-21 NOTE — Telephone Encounter (Signed)
schedule patient on 06/21/2018 per [name]  Patient have this cough for over 1 week and no fever

## 2018-06-23 ENCOUNTER — Other Ambulatory Visit: Payer: Self-pay | Admitting: Primary Care

## 2018-06-23 DIAGNOSIS — E1159 Type 2 diabetes mellitus with other circulatory complications: Secondary | ICD-10-CM

## 2018-06-23 DIAGNOSIS — Z794 Long term (current) use of insulin: Secondary | ICD-10-CM

## 2018-06-24 ENCOUNTER — Ambulatory Visit: Payer: Medicare Other | Admitting: Primary Care

## 2018-06-29 ENCOUNTER — Other Ambulatory Visit: Payer: Self-pay | Admitting: Interventional Cardiology

## 2018-07-01 NOTE — Telephone Encounter (Signed)
Eliquis 5mg  refill request received; pt is 73 yrs old, wt-98.4kg, Crea-1.07 on 01/14/2018, last seen by Dr. Irish Lack on 01/14/2018; will send in refill to requested pharmacy.

## 2018-07-13 ENCOUNTER — Other Ambulatory Visit: Payer: Self-pay | Admitting: Primary Care

## 2018-07-13 DIAGNOSIS — J309 Allergic rhinitis, unspecified: Secondary | ICD-10-CM

## 2018-07-24 ENCOUNTER — Other Ambulatory Visit: Payer: Self-pay

## 2018-07-24 ENCOUNTER — Other Ambulatory Visit (INDEPENDENT_AMBULATORY_CARE_PROVIDER_SITE_OTHER): Payer: Medicare Other

## 2018-07-24 DIAGNOSIS — Z794 Long term (current) use of insulin: Secondary | ICD-10-CM | POA: Diagnosis not present

## 2018-07-24 DIAGNOSIS — E1159 Type 2 diabetes mellitus with other circulatory complications: Secondary | ICD-10-CM | POA: Diagnosis not present

## 2018-07-24 LAB — POCT GLYCOSYLATED HEMOGLOBIN (HGB A1C): Hemoglobin A1C: 8.5 % — AB (ref 4.0–5.6)

## 2018-08-01 ENCOUNTER — Other Ambulatory Visit: Payer: Self-pay | Admitting: Primary Care

## 2018-08-01 DIAGNOSIS — J309 Allergic rhinitis, unspecified: Secondary | ICD-10-CM

## 2018-09-03 ENCOUNTER — Other Ambulatory Visit: Payer: Self-pay | Admitting: Primary Care

## 2018-09-03 DIAGNOSIS — E782 Mixed hyperlipidemia: Secondary | ICD-10-CM

## 2018-09-03 DIAGNOSIS — I10 Essential (primary) hypertension: Secondary | ICD-10-CM

## 2018-09-03 DIAGNOSIS — E1159 Type 2 diabetes mellitus with other circulatory complications: Secondary | ICD-10-CM

## 2018-09-13 ENCOUNTER — Other Ambulatory Visit (INDEPENDENT_AMBULATORY_CARE_PROVIDER_SITE_OTHER): Payer: Medicare Other

## 2018-09-13 ENCOUNTER — Ambulatory Visit: Payer: Medicare Other

## 2018-09-13 ENCOUNTER — Ambulatory Visit (INDEPENDENT_AMBULATORY_CARE_PROVIDER_SITE_OTHER): Payer: Medicare Other

## 2018-09-13 VITALS — BP 130/70 | HR 73 | Temp 97.4°F | Ht 68.0 in | Wt 211.0 lb

## 2018-09-13 DIAGNOSIS — I10 Essential (primary) hypertension: Secondary | ICD-10-CM | POA: Diagnosis not present

## 2018-09-13 DIAGNOSIS — E1159 Type 2 diabetes mellitus with other circulatory complications: Secondary | ICD-10-CM | POA: Diagnosis not present

## 2018-09-13 DIAGNOSIS — Z125 Encounter for screening for malignant neoplasm of prostate: Secondary | ICD-10-CM

## 2018-09-13 DIAGNOSIS — E782 Mixed hyperlipidemia: Secondary | ICD-10-CM | POA: Diagnosis not present

## 2018-09-13 DIAGNOSIS — Z794 Long term (current) use of insulin: Secondary | ICD-10-CM

## 2018-09-13 DIAGNOSIS — Z0000 Encounter for general adult medical examination without abnormal findings: Secondary | ICD-10-CM | POA: Diagnosis not present

## 2018-09-13 LAB — HEMOGLOBIN A1C: Hgb A1c MFr Bld: 9.5 % — ABNORMAL HIGH (ref 4.6–6.5)

## 2018-09-13 LAB — COMPREHENSIVE METABOLIC PANEL
ALT: 31 U/L (ref 0–53)
AST: 19 U/L (ref 0–37)
Albumin: 4.1 g/dL (ref 3.5–5.2)
Alkaline Phosphatase: 56 U/L (ref 39–117)
BUN: 17 mg/dL (ref 6–23)
CO2: 28 mEq/L (ref 19–32)
Calcium: 9.2 mg/dL (ref 8.4–10.5)
Chloride: 105 mEq/L (ref 96–112)
Creatinine, Ser: 1.01 mg/dL (ref 0.40–1.50)
GFR: 72.36 mL/min (ref >60.00)
Glucose, Bld: 220 mg/dL — ABNORMAL HIGH (ref 70–99)
Potassium: 4.6 mEq/L (ref 3.5–5.1)
Sodium: 140 mEq/L (ref 135–145)
Total Bilirubin: 0.5 mg/dL (ref 0.2–1.2)
Total Protein: 6.6 g/dL (ref 6.0–8.3)

## 2018-09-13 LAB — LIPID PANEL
Cholesterol: 99 mg/dL (ref 0–200)
HDL: 36.5 mg/dL — ABNORMAL LOW (ref >39.00)
LDL Cholesterol: 52 mg/dL (ref 0–99)
NonHDL: 62.81
Total CHOL/HDL Ratio: 3
Triglycerides: 53 mg/dL (ref 0.0–149.0)
VLDL: 10.6 mg/dL (ref 0.0–40.0)

## 2018-09-13 LAB — PSA, MEDICARE: PSA: 1.39 ng/ml (ref 0.10–4.00)

## 2018-09-13 NOTE — Progress Notes (Signed)
PCP notes:  Health Maintenance:  Colonoscopy and flu vaccine are due. Declines colonoscopy would take cologuard.  Abnormal Screenings:  None  Patient concerns:  None  Nurse concerns:  None  Next PCP appt.: 09/17/2018 at 8:20

## 2018-09-13 NOTE — Patient Instructions (Signed)
[name] , Thank you for taking time to come for your Medicare Wellness Visit. I appreciate your ongoing commitment to your health goals. Please review the following plan we discussed and let me know if I can assist you in the future.   Screening recommendations/referrals: Colonoscopy: declines Recommended yearly ophthalmology/optometry visit for glaucoma screening and checkup Recommended yearly dental visit for hygiene and checkup  Vaccinations: Influenza vaccine: 09/2017 Pneumococcal vaccine: 08/2016 Tdap vaccine: 01/2011 Shingles vaccine: discussed    Advanced directives: Please bring a copy of your POA (Power of [place]) and/or Living Will to your next appointment.    Conditions/risks identified: obesity  Next appointment: 09/17/2018 at 8:20  Preventive Care 49 Years and Older, Male Preventive care refers to lifestyle choices and visits with your health care provider that can promote health and wellness. What does preventive care include?  A yearly physical exam. This is also called an annual well check.  Dental exams once or twice a year.  Routine eye exams. Ask your health care provider how often you should have your eyes checked.  Personal lifestyle choices, including:  Daily care of your teeth and gums.  Regular physical activity.  Eating a healthy diet.  Avoiding tobacco and drug use.  Limiting alcohol use.  Practicing safe sex.  Taking low doses of aspirin every day.  Taking vitamin and mineral supplements as recommended by your health care provider. What happens during an annual well check? The services and screenings done by your health care provider during your annual well check will depend on your age, overall health, lifestyle risk factors, and family history of disease. Counseling  Your health care provider may ask you questions about your:  Alcohol use.  Tobacco use.  Drug use.  Emotional well-being.  Home and relationship well-being.   Sexual activity.  Eating habits.  History of falls.  Memory and ability to understand (cognition).  Work and work Statistician. Screening  You may have the following tests or measurements:  Height, weight, and BMI.  Blood pressure.  Lipid and cholesterol levels. These may be checked every 5 years, or more frequently if you are over 40 years old.  Skin check.  Lung cancer screening. You may have this screening every year starting at age 69 if you have a 30-pack-year history of smoking and currently smoke or have quit within the past 15 years.  Fecal occult blood test (FOBT) of the stool. You may have this test every year starting at age 36.  Flexible sigmoidoscopy or colonoscopy. You may have a sigmoidoscopy every 5 years or a colonoscopy every 10 years starting at age 55.  Prostate cancer screening. Recommendations will vary depending on your family history and other risks.  Hepatitis C blood test.  Hepatitis B blood test.  Sexually transmitted disease (STD) testing.  Diabetes screening. This is done by checking your blood sugar (glucose) after you have not eaten for a while (fasting). You may have this done every 1-3 years.  Abdominal aortic aneurysm (AAA) screening. You may need this if you are a current or former smoker.  Osteoporosis. You may be screened starting at age 36 if you are at high risk. Talk with your health care provider about your test results, treatment options, and if necessary, the need for more tests. Vaccines  Your health care provider may recommend certain vaccines, such as:  Influenza vaccine. This is recommended every year.  Tetanus, diphtheria, and acellular pertussis (Tdap, Td) vaccine. You may need a Td booster every  10 years.  Zoster vaccine. You may need this after age 5.  Pneumococcal 13-valent conjugate (PCV13) vaccine. One dose is recommended after age 12.  Pneumococcal polysaccharide (PPSV23) vaccine. One dose is recommended after  age 31. Talk to your health care provider about which screenings and vaccines you need and how often you need them. This information is not intended to replace advice given to you by your health care provider. Make sure you discuss any questions you have with your health care provider. Document Released: 01/15/2015 Document Revised: 09/08/2015 Document Reviewed: 10/20/2014 Elsevier Interactive Patient Education  2017 [employer] Prevention in the Home Falls can cause injuries. They can happen to people of all ages. There are many things you can do to make your home safe and to help prevent falls. What can I do on the outside of my home?  Regularly fix the edges of walkways and driveways and fix any cracks.  Remove anything that might make you trip as you walk through a door, such as a raised step or threshold.  Trim any bushes or trees on the path to your home.  Use bright outdoor lighting.  Clear any walking paths of anything that might make someone trip, such as rocks or tools.  Regularly check to see if handrails are loose or broken. Make sure that both sides of any steps have handrails.  Any raised decks and porches should have guardrails on the edges.  Have any leaves, snow, or ice cleared regularly.  Use sand or salt on walking paths during winter.  Clean up any spills in your garage right away. This includes oil or grease spills. What can I do in the bathroom?  Use night lights.  Install grab bars by the toilet and in the tub and shower. Do not use towel bars as grab bars.  Use non-skid mats or decals in the tub or shower.  If you need to sit down in the shower, use a plastic, non-slip stool.  Keep the floor dry. Clean up any water that spills on the floor as soon as it happens.  Remove soap buildup in the tub or shower regularly.  Attach bath mats securely with double-sided non-slip rug tape.  Do not have throw rugs and other things on the floor that can  make you trip. What can I do in the bedroom?  Use night lights.  Make sure that you have a light by your bed that is easy to reach.  Do not use any sheets or blankets that are too big for your bed. They should not hang down onto the floor.  Have a firm chair that has side arms. You can use this for support while you get dressed.  Do not have throw rugs and other things on the floor that can make you trip. What can I do in the kitchen?  Clean up any spills right away.  Avoid walking on wet floors.  Keep items that you use a lot in easy-to-reach places.  If you need to reach something above you, use a strong step stool that has a grab bar.  Keep electrical cords out of the way.  Do not use floor polish or wax that makes floors slippery. If you must use wax, use non-skid floor wax.  Do not have throw rugs and other things on the floor that can make you trip. What can I do with my stairs?  Do not leave any items on the stairs.  Make sure  that there are handrails on both sides of the stairs and use them. Fix handrails that are broken or loose. Make sure that handrails are as long as the stairways.  Check any carpeting to make sure that it is firmly attached to the stairs. Fix any carpet that is loose or worn.  Avoid having throw rugs at the top or bottom of the stairs. If you do have throw rugs, attach them to the floor with carpet tape.  Make sure that you have a light switch at the top of the stairs and the bottom of the stairs. If you do not have them, ask someone to add them for you. What else can I do to help prevent falls?  Wear shoes that:  Do not have high heels.  Have rubber bottoms.  Are comfortable and fit you well.  Are closed at the toe. Do not wear sandals.  If you use a stepladder:  Make sure that it is fully opened. Do not climb a closed stepladder.  Make sure that both sides of the stepladder are locked into place.  Ask someone to hold it for you,  if possible.  Clearly mark and make sure that you can see:  Any grab bars or handrails.  First and last steps.  Where the edge of each step is.  Use tools that help you move around (mobility aids) if they are needed. These include:  Canes.  Walkers.  Scooters.  Crutches.  Turn on the lights when you go into a dark area. Replace any light bulbs as soon as they burn out.  Set up your furniture so you have a clear path. Avoid moving your furniture around.  If any of your floors are uneven, fix them.  If there are any pets around you, be aware of where they are.  Review your medicines with your doctor. Some medicines can make you feel dizzy. This can increase your chance of falling. Ask your doctor what other things that you can do to help prevent falls. This information is not intended to replace advice given to you by your health care provider. Make sure you discuss any questions you have with your health care provider. Document Released: 10/15/2008 Document Revised: 05/27/2015 Document Reviewed: 01/23/2014 Elsevier Interactive Patient Education  2017 [employer].

## 2018-09-13 NOTE — Progress Notes (Signed)
Subjective:   [name] is a 73 y.o. male who presents for Medicare Annual/Subsequent preventive examination.  This visit type was conducted due to national recommendations for restrictions regarding the COVID-19 Pandemic (e.g. social distancing). This format is felt to be most appropriate for this patient at this time. All issues noted in this document were discussed and addressed. No physical exam was performed (except for noted visual exam findings with Video Visits). This patient, [name], has given permission to perform this visit via telephone. Vital signs may be absent or patient reported.  Patient location:  At home  Nurse location:  At home     Review of Systems:  n/a Cardiac Risk Factors include: advanced age (>93mn, >>16women);diabetes mellitus;hypertension;male gender;obesity (BMI >30kg/m2);dyslipidemia     Objective:    Vitals: BP 130/70 Comment: per patient   Pulse 73 Comment: per patient   Temp (!) 97.4 F (36.3 C) Comment: per patient   Ht 5' 8"  (1.727 m) Comment: per patient   Wt 211 lb (95.7 kg) Comment: per patient   BMI 32.08 kg/m   Body mass index is 32.08 kg/m.  Advanced Directives 09/13/2018 09/05/2017 08/24/2016 08/03/2015 06/18/2014 06/17/2014 05/12/2014  Does Patient Have a Medical Advance Directive? Yes Yes Yes Yes Yes No Yes  Type of [place]aramed[place]of [place] will Living will Living will;Healthcare Power of Attorney - Living will;Healthcare Power of Attorney  Does patient want to make changes to medical advance directive? - - - No - Patient declined No - Patient declined - No - Patient declined  Copy of [place] Chart? No - copy requested No - copy requested No - copy requested No - copy requested No - copy requested - No - copy requested    Tobacco Social History   Tobacco Use  Smoking Status Never Smoker    Smokeless Tobacco Never Used     Counseling given: Not Answered   Clinical Intake:  Pre-visit preparation completed: Yes  Pain : No/denies pain     Nutritional Status: BMI > 30  Obese Nutritional Risks: None Diabetes: Yes CBG done?: No Did pt. bring in CBG monitor from home?: No  How often do you need to have someone help you when you read instructions, pamphlets, or other written materials from your doctor or pharmacy?: 1 - Never What is the last grade level you completed in school?: 12th grade  Interpreter Needed?: No  Information entered by :: NAllen LPN  Past Medical History:  Diagnosis Date   Arthritis    "fingers, shoulders" (08/03/2015)   Atrial fibrillation ([place]    Paroxysmal, rare episodes, sinus rhythm on flecainide, patient prefers not to take Coumadin   Bradycardia    April, 2013   Chest pain    Nuclear, 2006, no ischemia   Chronic lower back pain    "all my life"   Coronary artery disease    s/p staged cath 05/12/2014 and 5/11, DES x 2 to heavily calcified RCA, residual with 60% prox LAD, 70% D1   Ejection fraction    EF 55-60%,  echo, January, 2011   Elevated CPK    CPK elevated with normal MB and normal troponin the past   Heart murmur    History of echocardiogram    Echo 8/16: EF 55%, normal wall motion, grade 2 diastolic dysfunction, trivial MR, normal RV function, PASP 27 mmHg   Hypertension  IBS (irritable bowel syndrome)    Myocardial infarction ([place]) 06/2014   Sinus drainage    Type II diabetes mellitus ([place])    Past Surgical History:  Procedure Laterality Date   APPENDECTOMY     CARDIAC CATHETERIZATION N/A 05/12/2014   Procedure: Left Heart Cath and Coronary Angiography;  Surgeon: Troy Sine, MD;  Location: [place] [facility];  Service: Cardiovascular;  Laterality: N/A;   CARDIAC CATHETERIZATION N/A 05/13/2014   Procedure: Coronary/Graft Atherectomy;  Surgeon: Troy Sine, MD;  Location: [place] [facility];   Service: Cardiovascular;  Laterality: N/A;   CARDIAC CATHETERIZATION Right 05/13/2014   Procedure: Temporary Pacemaker;  Surgeon: Troy Sine, MD;  Location: [place] [facility];  Service: Cardiovascular;  Laterality: Right;   CARDIAC CATHETERIZATION N/A 05/13/2014   Procedure: Coronary Stent Intervention;  Surgeon: Troy Sine, MD;  Location: [place] [facility];  Service: Cardiovascular;  Laterality: N/A;   CARDIAC CATHETERIZATION N/A 06/18/2014   Procedure: Left Heart Cath and Coronary Angiography;  Surgeon: Peter M Martinique, MD;  Location: [place] [facility];  Service: Cardiovascular;  Laterality: N/A;   CARDIAC CATHETERIZATION N/A 08/03/2015   Procedure: Left Heart Cath and Cors/Grafts Angiography;  Surgeon: Nelva Bush, MD;  Location: [place] [facility];  Service: Cardiovascular;  Laterality: N/A;   CARDIOVASCULAR STRESS TEST  05/06/2014   CATARACT EXTRACTION W/ INTRAOCULAR LENS IMPLANT Left    CORONARY ARTERY BYPASS GRAFT N/A 06/22/2014   Procedure: CORONARY ARTERY BYPASS GRAFTING (CABG) x five, using left internal mammary artery, and right leg greater saphenous vein harvested endoscopically;  Surgeon: Melrose Nakayama, MD;  Location: [place];  Service: Open Heart Surgery;  Laterality: N/A;   LAPAROSCOPIC CHOLECYSTECTOMY     MAZE N/A 06/22/2014   Procedure: MAZE;  Surgeon: Melrose Nakayama, MD;  Location: [place];  Service: Open Heart Surgery;  Laterality: N/A;   TEE WITHOUT CARDIOVERSION N/A 06/22/2014   Procedure: TRANSESOPHAGEAL ECHOCARDIOGRAM (TEE);  Surgeon: Melrose Nakayama, MD;  Location: [place];  Service: Open Heart Surgery;  Laterality: N/A;   Family History  Problem Relation Age of Onset   Alzheimer's disease Mother    Emphysema Father    Cancer Brother    Arrhythmia Sister    Heart attack Sister    Heart disease Sister    Hyperlipidemia Sister    Hypertension Sister    Social History   Socioeconomic History   Marital status: Married    Spouse  name: Not on file   Number of children: Not on file   Years of education: Not on file   Highest education level: Not on file  Occupational History   Not on file  Social Needs   Financial resource strain: Not hard at all   Food insecurity    Worry: Never true    Inability: Never true   Transportation needs    Medical: No    Non-medical: No  Tobacco Use   Smoking status: Never Smoker   Smokeless tobacco: Never Used  Substance and Sexual Activity   Alcohol use: No   Drug use: No   Sexual activity: Not on file  Lifestyle   Physical activity    Days per week: 3 days    Minutes per session: Not on file   Stress: Not at all  Relationships   Social connections    Talks on phone: Not on file    Gets together: Not on file    Attends religious service: Not  on file    Active member of club or organization: Not on file    Attends meetings of clubs or organizations: Not on file    Relationship status: Not on file  Other Topics Concern   Not on file  Social History Narrative   Not on file    Outpatient Encounter Medications as of 09/13/2018  Medication Sig   albuterol (PROVENTIL HFA;VENTOLIN HFA) 108 (90 Base) MCG/ACT inhaler Inhale 2 puffs into the lungs every 4 (four) hours as needed for wheezing or shortness of breath (cough, shortness of breath or wheezing.).   aspirin 81 MG tablet Take 81 mg by mouth daily.   carvedilol (COREG) 6.25 MG tablet TAKE 1 TABLET BY MOUTH TWICE A DAY WITH FOOD   dicyclomine (BENTYL) 20 MG tablet Take 20 mg 2 (two) times daily by mouth.   ELIQUIS 5 MG TABS tablet TAKE 1 TABLET BY MOUTH TWICE A DAY   fexofenadine (ALLEGRA) 180 MG tablet TAKE 1 TABLET (180 MG TOTAL) BY MOUTH DAILY. FOR ALLERGIES.   fluorometholone (FML) 0.1 % ophthalmic suspension Place 1 drop into the right eye daily as needed (for eye redness and itching).    fluticasone (FLONASE) 50 MCG/ACT nasal spray PLACE 1 SPRAY INTO BOTH NOSTRILS 2 (TWO) TIMES DAILY AS  NEEDED FOR ALLERGIES OR RHINITIS.   glyBURIDE-metformin (GLUCOVANCE) 2.5-500 MG tablet TAKE 1 TABLET BY MOUTH DAILY WITH BREAKFAST. FOR DIABETES.   insulin NPH Human (NOVOLIN N RELION) 100 UNIT/ML injection Inject 45 units into the skin every morning and 45 units into the skin in the evening.   Insulin Pen Needle 31G X 8 MM MISC Use as directed with Lantus.   insulin regular (NOVOLIN R RELION) 100 units/mL injection Inject 25 units into the skin twice daily with a smaller meal. Inject 32 units into the skin twice daily with a larger meal or if you have dessert.   isosorbide mononitrate (IMDUR) 30 MG 24 hr tablet Take 1 tablet (30 mg total) by mouth daily.   lisinopril (PRINIVIL,ZESTRIL) 5 MG tablet TAKE 1 TABLET (5 MG TOTAL) DAILY BY MOUTH.   MAGNESIUM PO Take 1 tablet by mouth daily as needed (for leg cramping).    nitroGLYCERIN (NITROSTAT) 0.4 MG SL tablet Place 1 tablet (0.4 mg total) under the tongue every 5 (five) minutes as needed for chest pain.   rosuvastatin (CRESTOR) 10 MG tablet Take 1 tablet (10 mg total) by mouth daily.   tamsulosin (FLOMAX) 0.4 MG CAPS capsule TAKE 1 CAPSULE (0.4 MG TOTAL) BY MOUTH AS NEEDED. FOR DIFFICULTY URINATING.   traMADol (ULTRAM) 50 MG tablet Take 1 tablet (50 mg total) by mouth every 12 (twelve) hours as needed for severe pain.   No facility-administered encounter medications on file as of 09/13/2018.     Activities of Daily Living In your present state of health, do you have any difficulty performing the following activities: 09/13/2018  Hearing? N  Vision? N  Difficulty concentrating or making decisions? N  Walking or climbing stairs? N  Dressing or bathing? N  Preparing Food and eating ? N  Using the Toilet? N  In the past six months, have you accidently leaked urine? N  Do you have problems with loss of bowel control? N  Managing your Medications? N  Managing your Finances? N  Housekeeping or managing your Housekeeping? N  Some recent  data might be hidden    Patient Care Team: [name], NP as PCP - General (Internal Medicine) Larae Grooms  S, MD as PCP - Cardiology (Cardiology)   Assessment:   This is a routine wellness examination for Jubal.  Exercise Activities and Dietary recommendations Current Exercise Habits: Home exercise routine(goes golfing), Frequency (Times/Week): 3  Goals     Increase physical activity     Starting 09/05/2017, I will continue to play golf for at least 4 hours 3 days per week and do yard work as needed.      Weight (lb) < 200 lb (90.7 kg)     09/13/2018, wants to get to 180 pounds       Fall Risk Fall Risk  09/13/2018 09/05/2017 08/24/2016 07/28/2016 03/10/2015  Falls in the past year? 0 No No No No  Comment - - - Emmi Telephone Survey: data to providers prior to load -  Risk for fall due to : Medication side effect - - - -  Follow up Falls evaluation completed;Falls prevention discussed - - - -   Is the patient's home free of loose throw rugs in walkways, pet beds, electrical cords, etc?   yes      Grab bars in the bathroom? yes      Handrails on the stairs?   yes      Adequate lighting?   yes  Timed Get Up and Go Performed: n/a  Depression Screen PHQ 2/9 Scores 09/13/2018 09/05/2017 08/24/2016 03/10/2015  PHQ - 2 Score 0 0 2 0  PHQ- 9 Score 0 0 2 -    Cognitive Function MMSE - Mini Mental State Exam 09/13/2018 09/05/2017 08/24/2016  Orientation to time 5 5 5   Orientation to Place 5 5 5   Registration 3 3 3   Attention/ Calculation 5 0 0  Recall 3 3 2   Recall-comments - - pt was unable to recall 1 of 3 words  Language- name 2 objects 0 0 0  Language- repeat 1 1 1   Language- follow 3 step command 0 3 3  Language- read & follow direction 0 0 0  Write a sentence 0 0 0  Copy design 0 0 0  Total score 22 20 19    Mini Cog  Mini-Cog screen was completed. Maximum score is 22. A value of 0 denotes this part of the MMSE was not completed or the patient failed this part of  the Mini-Cog screening.       Immunization History  Administered Date(s) Administered   Influenza Split 11/02/2013   Influenza,inj,Quad PF,6+ Mos 11/08/2015, 10/24/2016, 09/07/2017   Influenza-Unspecified 11/02/2012   Pneumococcal Conjugate-13 12/11/2014   Pneumococcal Polysaccharide-23 05/25/2005, 08/24/2016   Tdap 01/16/2011   Zoster 03/02/2013    Qualifies for Shingles Vaccine? yes  Screening Tests Health Maintenance  Topic Date Due   INFLUENZA VACCINE  08/03/2018   COLONOSCOPY  09/13/2019 (Originally 04/29/2018)   HEMOGLOBIN A1C  01/24/2019   OPHTHALMOLOGY EXAM  02/03/2019   FOOT EXAM  05/28/2019   TETANUS/TDAP  01/15/2021   Hepatitis C Screening  Completed   PNA vac Low Risk Adult  Completed   Cancer Screenings: Lung: Low Dose CT Chest recommended if Age 99-80 years, 30 pack-year currently smoking OR have quit w/in 15years. Patient does not qualify. Colorectal: declines colonoscopy  Additional Screenings:  Hepatitis C Screening:03/2015      Plan:    Patient wants to get to 180 pounds.  I have personally reviewed and noted the following in the patients chart:    Medical and social history  Use of alcohol, tobacco or illicit drugs   Current medications and  supplements  Functional ability and status  Nutritional status  Physical activity  Advanced directives  List of other physicians  Hospitalizations, surgeries, and ER visits in previous 12 months  Vitals  Screenings to include cognitive, depression, and falls  Referrals and appointments  In addition, I have reviewed and discussed with patient certain preventive protocols, quality metrics, and best practice recommendations. A written personalized care plan for preventive services as well as general preventive health recommendations were provided to patient.     Kellie Simmering, LPN  0/[id number]

## 2018-09-17 ENCOUNTER — Encounter: Payer: Self-pay | Admitting: Primary Care

## 2018-09-17 ENCOUNTER — Ambulatory Visit (INDEPENDENT_AMBULATORY_CARE_PROVIDER_SITE_OTHER): Payer: Medicare Other | Admitting: Primary Care

## 2018-09-17 ENCOUNTER — Encounter: Payer: Self-pay | Admitting: *Deleted

## 2018-09-17 ENCOUNTER — Other Ambulatory Visit: Payer: Self-pay

## 2018-09-17 VITALS — BP 128/78 | HR 78 | Temp 98.3°F | Ht 68.0 in | Wt 219.2 lb

## 2018-09-17 DIAGNOSIS — I25810 Atherosclerosis of coronary artery bypass graft(s) without angina pectoris: Secondary | ICD-10-CM

## 2018-09-17 DIAGNOSIS — Z794 Long term (current) use of insulin: Secondary | ICD-10-CM | POA: Diagnosis not present

## 2018-09-17 DIAGNOSIS — I480 Paroxysmal atrial fibrillation: Secondary | ICD-10-CM | POA: Diagnosis not present

## 2018-09-17 DIAGNOSIS — N400 Enlarged prostate without lower urinary tract symptoms: Secondary | ICD-10-CM | POA: Diagnosis not present

## 2018-09-17 DIAGNOSIS — E782 Mixed hyperlipidemia: Secondary | ICD-10-CM | POA: Diagnosis not present

## 2018-09-17 DIAGNOSIS — J309 Allergic rhinitis, unspecified: Secondary | ICD-10-CM | POA: Diagnosis not present

## 2018-09-17 DIAGNOSIS — K7581 Nonalcoholic steatohepatitis (NASH): Secondary | ICD-10-CM | POA: Diagnosis not present

## 2018-09-17 DIAGNOSIS — K589 Irritable bowel syndrome without diarrhea: Secondary | ICD-10-CM | POA: Diagnosis not present

## 2018-09-17 DIAGNOSIS — Z23 Encounter for immunization: Secondary | ICD-10-CM | POA: Diagnosis not present

## 2018-09-17 DIAGNOSIS — I10 Essential (primary) hypertension: Secondary | ICD-10-CM | POA: Diagnosis not present

## 2018-09-17 DIAGNOSIS — E1159 Type 2 diabetes mellitus with other circulatory complications: Secondary | ICD-10-CM

## 2018-09-17 MED ORDER — ZOSTER VAC RECOMB ADJUVANTED 50 MCG/0.5ML IM SUSR: 0.5000 mL | Freq: Once | INTRAMUSCULAR | 1 refills | Status: AC

## 2018-09-17 MED ORDER — NITROGLYCERIN 0.4 MG SL SUBL: 0.4000 mg | SUBLINGUAL_TABLET | SUBLINGUAL | 0 refills | Status: DC | PRN

## 2018-09-17 MED ORDER — ALBUTEROL SULFATE HFA 108 (90 BASE) MCG/ACT IN AERS: 2.0000 | INHALATION_SPRAY | RESPIRATORY_TRACT | 0 refills | Status: DC | PRN

## 2018-09-17 NOTE — Assessment & Plan Note (Signed)
LDL at goal on rosuvastatin, continue same.

## 2018-09-17 NOTE — Assessment & Plan Note (Signed)
Stable in the office today, continue current regimen. 

## 2018-09-17 NOTE — Assessment & Plan Note (Signed)
Uncontrolled with recent A1C of 9.5 which is most likely secondary to poor diet and lack of exercise.  He continues to refuse endocrinology evaluation. Discussed that he must improve his diet. Will stop regular insulin as he seems to be experiencing too many symptoms secondary.   Will have him continue with NPH 45 units BID for now as he works on his diet. He will monitor glucose readings and notify me if readings spike. In that case would increase NPH to 50 units BID.  Follow up in 3 months.

## 2018-09-17 NOTE — Assessment & Plan Note (Signed)
>>  ASSESSMENT AND PLAN FOR CAD (CORONARY ARTERY DISEASE) OF ARTERY BYPASS GRAFT WRITTEN ON 09/17/2018  8:50 AM BY CLARK, KATHERINE K, NP  Asymptomatic. Refilled nitroglycerin  due to expiration, no recent use. Following with cardiology. Continue statin therapy and BP control. Working very hard to gain control of diabetes, he continues to refuse endocrinology.

## 2018-09-17 NOTE — Assessment & Plan Note (Signed)
LFT's within normal range. Continue to monitor.

## 2018-09-17 NOTE — Assessment & Plan Note (Signed)
Denies concerns. No longer using dicyclomine.

## 2018-09-17 NOTE — Assessment & Plan Note (Signed)
Doing well on current regimen, refill provided for albuterol inhaler for which he uses infrequently.

## 2018-09-17 NOTE — Addendum Note (Signed)
Addended by: Jacqualin Combes on: 09/17/2018 10:01 AM   Modules accepted: Orders

## 2018-09-17 NOTE — Assessment & Plan Note (Signed)
Rate and rhythm regular. Continue carvedilol, Eliquis, and aspirin. Following with cardiology.

## 2018-09-17 NOTE — Patient Instructions (Signed)
Stop taking the insulin R for now.  Continue NPH 45 units twice daily. Please notify me if you start to notice spikes in your blood sugar as discussed.  It is important that you improve your diet. Please limit carbohydrates in the form of white bread, rice, pasta, sweets, fast food, fried food, sugary drinks, etc. Increase your consumption of fresh fruits and vegetables, whole grains, lean protein.  Ensure you are consuming 64 ounces of water daily.  Start exercising. You should be getting 150 minutes of exercise weekly.  Schedule a follow up visit for 3 months for diabetes check.  It was a pleasure to see you today!

## 2018-09-17 NOTE — Assessment & Plan Note (Signed)
Doing well on tamsulosin 0.4 mg daily, continue same. PSA of 1.39 on recent labs.

## 2018-09-17 NOTE — Assessment & Plan Note (Signed)
Asymptomatic. Refilled nitroglycerin due to expiration, no recent use. Following with cardiology. Continue statin therapy and BP control. Working very hard to gain control of diabetes, he continues to refuse endocrinology.

## 2018-09-17 NOTE — Progress Notes (Signed)
Subjective:    Patient ID: [name], male    DOB: 04-26-1945, 73 y.o.   MRN: [id number]  HPI  [name] is a 73 year old male who presents today for follow up.   1) Type 2 Diabetes:   Current medications include: Glyburide-metformin 2.5-500 BID, NPH 45 units BID, Novolin R 25-32 units TID with meals but is actually doing 20 units BID with meals.   He is checking his blood glucose 2 times daily and is getting readings of:  AM fasting: 130's-180's Before dinner: low 100's-170's  Lowest reading: 70's Highest reading: 220  He continues to experience periods of feeling shaky, jittery after playing golf which will cause him to eat candy bars for improvement.   Last A1C: 9.5 in September 2020 Last Eye Exam: Due in February 2021 Last Foot Exam: Completed in May 2020 Pneumonia Vaccination: Completed in 2018 ACE/ARB: Lisinopril Statin: Rosuvastatin   He admits to a poor diet with traveling over the last one month. Has also been eating poorly due to birthdays and anniversaries. He endorses that "I ate like a pig for one month".   2) CAD/Atrial Fibrillation/Hyperlipidemia: Currently managed on carvedilol 6.25 mg BD, isosorbide mononitrate 30 mg daily, Eliquis 5 mg BID, Lisinopril 5 mg daily, nitroglycerin PRN. Following with cardiology with last visit in January 2020. He is needing a refill of his nitroglycerin, no recent use.   BP Readings from Last 3 Encounters:  09/17/18 128/78  09/13/18 130/70  05/28/18 130/78    3) BPH: Currently managed on tamsulosin 0.4 mg daily. Denies nocturia. PSA on recent labs of 1.39.  Review of Systems  Eyes: Negative for visual disturbance.  Respiratory: Negative for shortness of breath.   Cardiovascular: Negative for chest pain.  Neurological: Negative for dizziness, numbness and headaches.       Past Medical History:  Diagnosis Date  . Arthritis    "fingers, shoulders" (08/03/2015)  . Atrial fibrillation (HCC)    Paroxysmal, rare  episodes, sinus rhythm on flecainide, patient prefers not to take Coumadin  . Bradycardia    April, 2013  . Chest pain    Nuclear, 2006, no ischemia  . Chronic lower back pain    "all my life"  . Coronary artery disease    s/p staged cath 05/12/2014 and 5/11, DES x 2 to heavily calcified RCA, residual with 60% prox LAD, 70% D1  . Ejection fraction    EF 55-60%,  echo, January, 2011  . Elevated CPK    CPK elevated with normal MB and normal troponin the past  . Heart murmur   . History of echocardiogram    Echo 8/16: EF 55%, normal wall motion, grade 2 diastolic dysfunction, trivial MR, normal RV function, PASP 27 mmHg  . Hypertension   . IBS (irritable bowel syndrome)   . Myocardial infarction ([place]) 06/2014  . Sinus drainage   . Type II diabetes mellitus ([place])      Social History   Socioeconomic History  . Marital status: Married    Spouse name: Not on file  . Number of children: Not on file  . Years of education: Not on file  . Highest education level: Not on file  Occupational History  . Not on file  Social Needs  . Financial resource strain: Not hard at all  . Food insecurity    Worry: Never true    Inability: Never true  . Transportation needs    Medical: No    Non-medical:  No  Tobacco Use  . Smoking status: Never Smoker  . Smokeless tobacco: Never Used  Substance and Sexual Activity  . Alcohol use: No  . Drug use: No  . Sexual activity: Not on file  Lifestyle  . Physical activity    Days per week: 3 days    Minutes per session: Not on file  . Stress: Not at all  Relationships  . Social Herbalist on phone: Not on file    Gets together: Not on file    Attends religious service: Not on file    Active member of club or organization: Not on file    Attends meetings of clubs or organizations: Not on file    Relationship status: Not on file  . Intimate partner violence    Fear of current or ex partner: No    Emotionally abused: No    Physically  abused: No    Forced sexual activity: No  Other Topics Concern  . Not on file  Social History Narrative  . Not on file    Past Surgical History:  Procedure Laterality Date  . APPENDECTOMY    . CARDIAC CATHETERIZATION N/A 05/12/2014   Procedure: Left Heart Cath and Coronary Angiography;  Surgeon: Troy Sine, MD;  Location: [facility];  Service: Cardiovascular;  Laterality: N/A;  . CARDIAC CATHETERIZATION N/A 05/13/2014   Procedure: Coronary/Graft Atherectomy;  Surgeon: Troy Sine, MD;  Location: [facility];  Service: Cardiovascular;  Laterality: N/A;  . CARDIAC CATHETERIZATION Right 05/13/2014   Procedure: Temporary Pacemaker;  Surgeon: Troy Sine, MD;  Location: [facility];  Service: Cardiovascular;  Laterality: Right;  . CARDIAC CATHETERIZATION N/A 05/13/2014   Procedure: Coronary Stent Intervention;  Surgeon: Troy Sine, MD;  Location: [facility];  Service: Cardiovascular;  Laterality: N/A;  . CARDIAC CATHETERIZATION N/A 06/18/2014   Procedure: Left Heart Cath and Coronary Angiography;  Surgeon: Peter M Martinique, MD;  Location: [facility];  Service: Cardiovascular;  Laterality: N/A;  . CARDIAC CATHETERIZATION N/A 08/03/2015   Procedure: Left Heart Cath and Cors/Grafts Angiography;  Surgeon: Nelva Bush, MD;  Location: [facility];  Service: Cardiovascular;  Laterality: N/A;  . CARDIOVASCULAR STRESS TEST  05/06/2014  . CATARACT EXTRACTION W/ INTRAOCULAR LENS IMPLANT Left   . CORONARY ARTERY BYPASS GRAFT N/A 06/22/2014   Procedure: CORONARY ARTERY BYPASS GRAFTING (CABG) x five, using left internal mammary artery, and right leg greater saphenous vein harvested endoscopically;  Surgeon: Melrose Nakayama, MD;  Location: [facility];  Service: Open Heart Surgery;  Laterality: N/A;  . LAPAROSCOPIC CHOLECYSTECTOMY    . MAZE N/A 06/22/2014   Procedure: MAZE;  Surgeon: Melrose Nakayama, MD;  Location: [facility];  Service: Open Heart Surgery;   Laterality: N/A;  . TEE WITHOUT CARDIOVERSION N/A 06/22/2014   Procedure: TRANSESOPHAGEAL ECHOCARDIOGRAM (TEE);  Surgeon: Melrose Nakayama, MD;  Location: [facility];  Service: Open Heart Surgery;  Laterality: N/A;    Family History  Problem Relation Age of Onset  . Alzheimer's disease Mother   . Emphysema Father   . Cancer Brother   . Arrhythmia Sister   . Heart attack Sister   . Heart disease Sister   . Hyperlipidemia Sister   . Hypertension Sister     Allergies  Allergen Reactions  . Lipitor [Atorvastatin] Other (See Comments)    Myopathy Spring 2006  . Pravastatin Other (See Comments)    LEG  CRAMPS  . Simvastatin Other (See Comments)    LEG CRAMPS    Current Outpatient Medications on File Prior to Visit  Medication Sig Dispense Refill  . aspirin 81 MG tablet Take 81 mg by mouth daily.    . carvedilol (COREG) 6.25 MG tablet TAKE 1 TABLET BY MOUTH TWICE A DAY WITH FOOD 180 tablet 3  . ELIQUIS 5 MG TABS tablet TAKE 1 TABLET BY MOUTH TWICE A DAY 60 tablet 5  . fexofenadine (ALLEGRA) 180 MG tablet TAKE 1 TABLET (180 MG TOTAL) BY MOUTH DAILY. FOR ALLERGIES. 90 tablet 0  . fluorometholone (FML) 0.1 % ophthalmic suspension Place 1 drop into the right eye daily as needed (for eye redness and itching).     . fluticasone (FLONASE) 50 MCG/ACT nasal spray PLACE 1 SPRAY INTO BOTH NOSTRILS 2 (TWO) TIMES DAILY AS NEEDED FOR ALLERGIES OR RHINITIS. 16 mL 2  . glyBURIDE-metformin (GLUCOVANCE) 2.5-500 MG tablet TAKE 1 TABLET BY MOUTH DAILY WITH BREAKFAST. FOR DIABETES. 90 tablet 1  . insulin NPH Human (NOVOLIN N RELION) 100 UNIT/ML injection Inject 45 units into the skin every morning and 45 units into the skin in the evening. 10 mL 11  . Insulin Pen Needle 31G X 8 MM MISC Use as directed with Lantus. 100 each 5  . isosorbide mononitrate (IMDUR) 30 MG 24 hr tablet Take 1 tablet (30 mg total) by mouth daily. 90 tablet 3  . lisinopril (PRINIVIL,ZESTRIL) 5 MG tablet TAKE 1 TABLET (5 MG TOTAL) DAILY  BY MOUTH. 90 tablet 3  . MAGNESIUM PO Take 1 tablet by mouth daily as needed (for leg cramping).     . rosuvastatin (CRESTOR) 10 MG tablet Take 1 tablet (10 mg total) by mouth daily. 90 tablet 3  . tamsulosin (FLOMAX) 0.4 MG CAPS capsule TAKE 1 CAPSULE (0.4 MG TOTAL) BY MOUTH AS NEEDED. FOR DIFFICULTY URINATING. 90 capsule 1   No current facility-administered medications on file prior to visit.     BP 128/78   Pulse 78   Temp 98.3 F (36.8 C) (Temporal)   Ht 5\' 8"  (1.727 m)   Wt 219 lb 4 oz (99.5 kg)   SpO2 98%   BMI 33.34 kg/m    Objective:   Physical Exam  Constitutional: He appears well-nourished.  Neck: Neck supple.  Cardiovascular: Normal rate and regular rhythm.  Respiratory: Effort normal and breath sounds normal.  Skin: Skin is warm and dry.  Psychiatric: He has a normal mood and affect.           Assessment & Plan:

## 2018-10-11 ENCOUNTER — Other Ambulatory Visit: Payer: Self-pay | Admitting: Primary Care

## 2018-10-11 DIAGNOSIS — I25810 Atherosclerosis of coronary artery bypass graft(s) without angina pectoris: Secondary | ICD-10-CM

## 2018-10-25 ENCOUNTER — Telehealth: Payer: Self-pay | Admitting: Primary Care

## 2018-10-25 NOTE — Telephone Encounter (Signed)
I received a form from [place] requesting approval for cardiovascular risk assessment. Did he initiate this? He already sees cardiology.

## 2018-10-28 NOTE — Telephone Encounter (Signed)
Noted and discarded.

## 2018-10-28 NOTE — Telephone Encounter (Signed)
Spoken and notified patient of Tawni Millers comments. Patient verbalized understanding. Patient stated that did not initiate and please discard it.

## 2018-11-04 ENCOUNTER — Other Ambulatory Visit: Payer: Self-pay | Admitting: Interventional Cardiology

## 2018-11-07 NOTE — Telephone Encounter (Signed)
[employer] called to follow up on paperwork Advised that we reached out to patient.  And patient stated that he did not initiate this and wanted this discarded.

## 2018-12-18 ENCOUNTER — Encounter: Payer: Self-pay | Admitting: Primary Care

## 2018-12-18 ENCOUNTER — Other Ambulatory Visit: Payer: Self-pay

## 2018-12-18 ENCOUNTER — Ambulatory Visit (INDEPENDENT_AMBULATORY_CARE_PROVIDER_SITE_OTHER): Payer: Medicare Other | Admitting: Primary Care

## 2018-12-18 VITALS — BP 136/82 | HR 82 | Temp 96.5°F | Ht 68.0 in | Wt 212.0 lb

## 2018-12-18 DIAGNOSIS — I25810 Atherosclerosis of coronary artery bypass graft(s) without angina pectoris: Secondary | ICD-10-CM | POA: Diagnosis not present

## 2018-12-18 DIAGNOSIS — Z794 Long term (current) use of insulin: Secondary | ICD-10-CM | POA: Diagnosis not present

## 2018-12-18 DIAGNOSIS — E1159 Type 2 diabetes mellitus with other circulatory complications: Secondary | ICD-10-CM | POA: Diagnosis not present

## 2018-12-18 LAB — POCT GLYCOSYLATED HEMOGLOBIN (HGB A1C): Hemoglobin A1C: 9.6 % — AB (ref 4.0–5.6)

## 2018-12-18 MED ORDER — INSULIN NPH (HUMAN) (ISOPHANE) 100 UNIT/ML ~~LOC~~ SUSP: SUBCUTANEOUS | 5 refills | Status: DC

## 2018-12-18 NOTE — Progress Notes (Signed)
   Subjective:    Patient ID: [name], male    DOB: 1945/02/27, 73 y.o.   MRN: [id number]  HPI  [name] is a 73 year old male with a history of atrial fibrillation on Eliquis, hypertension, hyperlipidemia, CAD c/ NSTEMI, BPH and type 2 diabetes who presents today for a follow-up of his diabetes.   Current medications include: NPH 45 units bid. He stopped taking the Glyburide-Metformin 2.5-500 mg daily due to severe GI upset.   He is checking his/her blood glucose 2 times daily, in the morning before meals and around 6:30/7pm, sometimes 30 minutes after a meal. He is getting readings of 170s-230s, with a low of 95, highest reading 235.   Last A1C: 9.5, 09/13/18 Last Eye Exam: Due, scheduled for  Last Foot Exam: Completed 2020 Pneumonia Vaccination: Completed Influenza Vaccination: Completed this season ACE/ARB: Lisinopril 5 mg  Statin: Crestor 10 mg  Diet currently consists of: He endorses a poor diet. Eating mostly home cooked meals. Desserts when blood sugar drops. He states he has eaten poorly with the holidays. Drinking caffeine diet coke and water.   Exercise: Not exercising. Playing golf three times a week.    BP Readings from Last 3 Encounters:  12/18/18 136/82  09/17/18 128/78  09/13/18 130/70       Review of Systems  Constitutional: Negative for unexpected weight change.  Eyes: Negative for visual disturbance.  Respiratory: Negative for shortness of breath.   Cardiovascular: Negative for chest pain.  Gastrointestinal: Negative for constipation and diarrhea.  Endocrine: Negative for polydipsia, polyphagia and polyuria.  Genitourinary: Negative for difficulty urinating.  Neurological: Negative for dizziness, numbness and headaches.       Objective:   Physical Exam Cardiovascular:     Rate and Rhythm: Normal rate and regular rhythm.     Heart sounds: Normal heart sounds.  Pulmonary:     Effort: Pulmonary effort is normal.     Breath sounds: Normal  breath sounds.  Neurological:     Mental Status: He is alert and oriented to person, place, and time.           Assessment & Plan:

## 2018-12-18 NOTE — Patient Instructions (Addendum)
We have increased your NPH insulin to 50 units twice daily. Inject this medication into the skin.   Start checking your blood sugar levels.  Appropriate times to check your blood sugar levels are:  -Before any meal (breakfast, lunch, dinner) -Two hours after any meal (breakfast, lunch, dinner) -Bedtime  Record your readings and notify me if you continue to consistently run at or above 200.  It's important to improve your diet by reducing consumption of fast food, fried food, processed snack foods, sugary drinks. Increase consumption of fresh vegetables and fruits, whole grains, water.  Ensure you are drinking 64 ounces of water daily.  Start exercising. You should be getting 150 minutes of moderate intensity exercise weekly.   Please schedule a follow up appointment in 3 months.  It was a pleasure to see you today!

## 2018-12-18 NOTE — Progress Notes (Signed)
Subjective:    Patient ID: [name], male    DOB: 26-Apr-1945, 73 y.o.   MRN: [id number]  HPI  [name] is a 73 year old male with a history of uncontrolled diabetes, CAD, hypertension, atrial fibrillation, hyperlipidemia who presents today for follow up of diabetes.   Current medications include: Novolin N 45 units BID.  He is checking his blood glucose 2 times daily and is getting readings of:  AM fasting: 170's Before Dinner: 230's 30 minutes after Dinner: 230's  Lowest reading: 95 Highest reading: 230's   Last A1C: 9.5 in September 2020, today 9.6 Last Eye Exam: UTD Last Foot Exam: UTD Pneumonia Vaccination: Completed in 2018 ACE/ARB: ACE Statin: Crestor  BP Readings from Last 3 Encounters:  12/18/18 136/82  09/17/18 128/78  09/13/18 130/70        Review of Systems  Eyes: Negative for visual disturbance.  Respiratory: Negative for shortness of breath.   Cardiovascular: Negative for chest pain.  Neurological: Negative for dizziness and headaches.       Past Medical History:  Diagnosis Date  . Arthritis    "fingers, shoulders" (08/03/2015)  . Atrial fibrillation (HCC)    Paroxysmal, rare episodes, sinus rhythm on flecainide, patient prefers not to take Coumadin  . Bradycardia    April, 2013  . Chest pain    Nuclear, 2006, no ischemia  . Chronic lower back pain    "all my life"  . Coronary artery disease    s/p staged cath 05/12/2014 and 5/11, DES x 2 to heavily calcified RCA, residual with 60% prox LAD, 70% D1  . Ejection fraction    EF 55-60%,  echo, January, 2011  . Elevated CPK    CPK elevated with normal MB and normal troponin the past  . Heart murmur   . History of echocardiogram    Echo 8/16: EF 55%, normal wall motion, grade 2 diastolic dysfunction, trivial MR, normal RV function, PASP 27 mmHg  . Hypertension   . IBS (irritable bowel syndrome)   . Myocardial infarction ([facility]) 06/2014  . Sinus drainage   . Type II diabetes mellitus  ([place])      Social History   Socioeconomic History  . Marital status: Married    Spouse name: Not on file  . Number of children: Not on file  . Years of education: Not on file  . Highest education level: Not on file  Occupational History  . Not on file  Tobacco Use  . Smoking status: Never Smoker  . Smokeless tobacco: Never Used  Substance and Sexual Activity  . Alcohol use: No  . Drug use: No  . Sexual activity: Not on file  Other Topics Concern  . Not on file  Social History Narrative  . Not on file   Social Determinants of Health   Financial Resource Strain: Low Risk   . Difficulty of Paying Living Expenses: Not hard at all  Food Insecurity: No Food Insecurity  . Worried About Charity fundraiser in the Last Year: Never true  . Ran Out of Food in the Last Year: Never true  Transportation Needs: No Transportation Needs  . Lack of Transportation (Medical): No  . Lack of Transportation (Non-Medical): No  Physical Activity: Unknown  . Days of Exercise per Week: 3 days  . Minutes of Exercise per Session: Not on file  Stress: No Stress Concern Present  . Feeling of Stress : Not at all  Social Connections:   .  Frequency of Communication with Friends and Family: Not on file  . Frequency of Social Gatherings with Friends and Family: Not on file  . Attends Religious Services: Not on file  . Active Member of Clubs or Organizations: Not on file  . Attends Archivist Meetings: Not on file  . Marital Status: Not on file  Intimate Partner Violence: Not At Risk  . Fear of Current or Ex-Partner: No  . Emotionally Abused: No  . Physically Abused: No  . Sexually Abused: No    Past Surgical History:  Procedure Laterality Date  . APPENDECTOMY    . CARDIAC CATHETERIZATION N/A 05/12/2014   Procedure: Left Heart Cath and Coronary Angiography;  Surgeon: Troy Sine, MD;  Location: [facility];  Service: Cardiovascular;  Laterality: N/A;  . CARDIAC  CATHETERIZATION N/A 05/13/2014   Procedure: Coronary/Graft Atherectomy;  Surgeon: Troy Sine, MD;  Location: [facility];  Service: Cardiovascular;  Laterality: N/A;  . CARDIAC CATHETERIZATION Right 05/13/2014   Procedure: Temporary Pacemaker;  Surgeon: Troy Sine, MD;  Location: [facility];  Service: Cardiovascular;  Laterality: Right;  . CARDIAC CATHETERIZATION N/A 05/13/2014   Procedure: Coronary Stent Intervention;  Surgeon: Troy Sine, MD;  Location: [facility];  Service: Cardiovascular;  Laterality: N/A;  . CARDIAC CATHETERIZATION N/A 06/18/2014   Procedure: Left Heart Cath and Coronary Angiography;  Surgeon: Peter M Martinique, MD;  Location: [facility];  Service: Cardiovascular;  Laterality: N/A;  . CARDIAC CATHETERIZATION N/A 08/03/2015   Procedure: Left Heart Cath and Cors/Grafts Angiography;  Surgeon: Nelva Bush, MD;  Location: [facility];  Service: Cardiovascular;  Laterality: N/A;  . CARDIOVASCULAR STRESS TEST  05/06/2014  . CATARACT EXTRACTION W/ INTRAOCULAR LENS IMPLANT Left   . CORONARY ARTERY BYPASS GRAFT N/A 06/22/2014   Procedure: CORONARY ARTERY BYPASS GRAFTING (CABG) x five, using left internal mammary artery, and right leg greater saphenous vein harvested endoscopically;  Surgeon: Melrose Nakayama, MD;  Location: [facility];  Service: Open Heart Surgery;  Laterality: N/A;  . LAPAROSCOPIC CHOLECYSTECTOMY    . MAZE N/A 06/22/2014   Procedure: MAZE;  Surgeon: Melrose Nakayama, MD;  Location: [facility];  Service: Open Heart Surgery;  Laterality: N/A;  . TEE WITHOUT CARDIOVERSION N/A 06/22/2014   Procedure: TRANSESOPHAGEAL ECHOCARDIOGRAM (TEE);  Surgeon: Melrose Nakayama, MD;  Location: [facility];  Service: Open Heart Surgery;  Laterality: N/A;    Family History  Problem Relation Age of Onset  . Alzheimer's disease Mother   . Emphysema Father   . Cancer Brother   . Arrhythmia Sister   . Heart attack Sister   . Heart disease Sister    . Hyperlipidemia Sister   . Hypertension Sister     Allergies  Allergen Reactions  . Lipitor [Atorvastatin] Other (See Comments)    Myopathy Spring 2006  . Pravastatin Other (See Comments)    LEG CRAMPS  . Simvastatin Other (See Comments)    LEG CRAMPS    Current Outpatient Medications on File Prior to Visit  Medication Sig Dispense Refill  . albuterol (VENTOLIN HFA) 108 (90 Base) MCG/ACT inhaler Inhale 2 puffs into the lungs every 4 (four) hours as needed for wheezing or shortness of breath (cough, shortness of breath or wheezing.). 18 g 0  . aspirin 81 MG tablet Take 81 mg by mouth daily.    . carvedilol (COREG) 6.25 MG tablet TAKE 1 TABLET BY MOUTH TWICE A DAY  WITH FOOD 180 tablet 3  . ELIQUIS 5 MG TABS tablet TAKE 1 TABLET BY MOUTH TWICE A DAY 60 tablet 5  . fexofenadine (ALLEGRA) 180 MG tablet TAKE 1 TABLET (180 MG TOTAL) BY MOUTH DAILY. FOR ALLERGIES. 90 tablet 0  . fluorometholone (FML) 0.1 % ophthalmic suspension Place 1 drop into the right eye daily as needed (for eye redness and itching).     . fluticasone (FLONASE) 50 MCG/ACT nasal spray PLACE 1 SPRAY INTO BOTH NOSTRILS 2 (TWO) TIMES DAILY AS NEEDED FOR ALLERGIES OR RHINITIS. 16 mL 2  . Insulin Pen Needle 31G X 8 MM MISC Use as directed with Lantus. 100 each 5  . isosorbide mononitrate (IMDUR) 30 MG 24 hr tablet Take 1 tablet (30 mg total) by mouth daily. 90 tablet 3  . lisinopril (ZESTRIL) 5 MG tablet TAKE 1 TABLET (5 MG TOTAL) DAILY BY MOUTH. 90 tablet 3  . MAGNESIUM PO Take 1 tablet by mouth daily as needed (for leg cramping).     . nitroGLYCERIN (NITROSTAT) 0.4 MG SL tablet Place 1 tablet (0.4 mg total) under the tongue every 5 (five) minutes as needed for chest pain. 25 tablet 0  . rosuvastatin (CRESTOR) 10 MG tablet Take 1 tablet (10 mg total) by mouth daily. 90 tablet 3  . tamsulosin (FLOMAX) 0.4 MG CAPS capsule TAKE 1 CAPSULE (0.4 MG TOTAL) BY MOUTH AS NEEDED. FOR DIFFICULTY URINATING. 90 capsule 1   No current  facility-administered medications on file prior to visit.    BP 136/82   Pulse 82   Temp (!) 96.5 F (35.8 C) (Temporal)   Ht 5' 8"  (1.727 m)   Wt 212 lb (96.2 kg)   SpO2 98%   BMI 32.23 kg/m    Objective:   Physical Exam  Constitutional: He appears well-nourished.  Cardiovascular: Normal rate and regular rhythm.  Respiratory: Effort normal and breath sounds normal.  Musculoskeletal:     Cervical back: Neck supple.  Skin: Skin is warm and dry.  Psychiatric: He has a normal mood and affect.           Assessment & Plan:

## 2018-12-18 NOTE — Assessment & Plan Note (Addendum)
Uncontrolled with A1C today of 9.6, increased from last visit. He endorses a poor diet and no exercise.  His treatment options are limited due to cost, as he is in the doughnut hole quickly with medications such as Lantus, Levemir, Basaglar, Trulicity.   We discussed Trulicity, however, he was unable to afford this in the past. He is unable to tolerate Metformin due to severe GI upset and did not tolerate regular insulin due to secondary symptoms of hypoglycemia and feeling jittery.   He continues to decline referral to endocrinology despite strong recommendations.  As he remains above his goal A1C, we will increase his NPH to 50 units bid.   He will notify if blood sugar readings consistently > 200.   Labs, foot exam up to date. Managed on ACE/ARB and statin. He has an appointment with opthalmology for an eye exam in January 2021.   Follow-up in 3 months.    Agree with plan, [name], NP

## 2018-12-26 ENCOUNTER — Encounter: Payer: Self-pay | Admitting: Primary Care

## 2018-12-26 ENCOUNTER — Other Ambulatory Visit: Payer: Self-pay | Admitting: *Deleted

## 2018-12-26 ENCOUNTER — Telehealth: Payer: Self-pay

## 2018-12-26 ENCOUNTER — Ambulatory Visit (INDEPENDENT_AMBULATORY_CARE_PROVIDER_SITE_OTHER): Payer: Medicare Other | Admitting: Primary Care

## 2018-12-26 DIAGNOSIS — R197 Diarrhea, unspecified: Secondary | ICD-10-CM | POA: Insufficient documentation

## 2018-12-26 DIAGNOSIS — I25810 Atherosclerosis of coronary artery bypass graft(s) without angina pectoris: Secondary | ICD-10-CM

## 2018-12-26 NOTE — Assessment & Plan Note (Signed)
Suspect viral GI etiology, lower likelihood for Covid-19. Discussed to switch to a bland diet, stop eating fried foods. Limit sugary sodas/drinks, switch to low calorie gatorade/ginger ale, water.  He appear very well and vitals are WNL which is reassuring. Discussed to start imodium tomorrow if no decrease in stool frequency. Continue to hydrate and advance diet as tolerated.  Return precautions provided.

## 2018-12-26 NOTE — Telephone Encounter (Signed)
Patient evaluated via [email or web address]. He is eating and drinking well. See notes.

## 2018-12-26 NOTE — Progress Notes (Signed)
Subjective:    Patient ID: [name], male    DOB: 10-18-1945, 73 y.o.   MRN: [id number]  HPI  Virtual Visit via Video Note  I connected with [name] on 12/26/18 at 10:20 AM EST by a video enabled telemedicine application and verified that I am speaking with the correct person using two identifiers.  Location: Patient: Home Provider: Office   I discussed the limitations of evaluation and management by telemedicine and the availability of in person appointments. The patient expressed understanding and agreed to proceed.  History of Present Illness:  [name] is a 73 year old male with a history of CAD, hypertension, NSTEMI, uncontrolled diabetes, NASH, IBS, BPH who presents today with a chief complaint of diarrhea.  He's experiencing bouts of diarrhea that began around 10 am two days ago that's occuring every 2 hours and has been so since. His stools are liquid/watery. He doesn't wake during the night with diarrhea. Today he's had four episodes of diarrhea this morning. He did have one episode of vomiting two days ago, none since. Also with intermittent full body cramping.   He denies nausea, vomiting, abdominal pain, bloody stools, cough/cold symptoms, loss of taste/smell, known exposure to Covid-19. No one else has these symptoms in his household. He's drinking a lot of ginger ale and water, eating well overall as he's eating fried chicken and potatoes. Today he feels well.   He's not taken anything OTC for symptoms.     Observations/Objective:  Alert and oriented. Appears well, not sickly. No distress. Speaking in complete sentences.   Assessment and Plan:  Suspect viral GI etiology, lower likelihood for Covid-19. Discussed to switch to a bland diet, stop eating fried foods. Limit sugary sodas/drinks, switch to low calorie gatorade/ginger ale, water.  He appear very well and vitals are WNL which is reassuring. Discussed to start imodium tomorrow if no decrease  in stool frequency. Continue to hydrate and advance diet as tolerated.  Return precautions provided.  Follow Up Instructions:  Stop eating fried/fatty/greasy food. Switch to a bland diet.  Start Imodium tomorrow if your stools do not reduce in frequency.  Switch to low calorie Gatorade/low calorie ginger ale.   Please go to the hospital if you start to vomit, your diarrhea progresses, you start running fevers, you can't stay hydrated.  It was a pleasure to see you today! [name], NP-C    I discussed the assessment and treatment plan with the patient. The patient was provided an opportunity to ask questions and all were answered. The patient agreed with the plan and demonstrated an understanding of the instructions.   The patient was advised to call back or seek an in-person evaluation if the symptoms worsen or if the condition fails to improve as anticipated.    [name], NP    Review of Systems  Constitutional: Negative for fever.  Gastrointestinal: Positive for diarrhea. Negative for abdominal pain, blood in stool, nausea and vomiting.  Musculoskeletal: Positive for myalgias.       Past Medical History:  Diagnosis Date  . Arthritis    "fingers, shoulders" (08/03/2015)  . Atrial fibrillation (HCC)    Paroxysmal, rare episodes, sinus rhythm on flecainide, patient prefers not to take Coumadin  . Bradycardia    April, 2013  . Chest pain    Nuclear, 2006, no ischemia  . Chronic lower back pain    "all my life"  . Coronary artery disease    s/p staged  cath 05/12/2014 and 5/11, DES x 2 to heavily calcified RCA, residual with 60% prox LAD, 70% D1  . Ejection fraction    EF 55-60%,  echo, January, 2011  . Elevated CPK    CPK elevated with normal MB and normal troponin the past  . Heart murmur   . History of echocardiogram    Echo 8/16: EF 55%, normal wall motion, grade 2 diastolic dysfunction, trivial MR, normal RV function, PASP 27 mmHg  . Hypertension   .  IBS (irritable bowel syndrome)   . Myocardial infarction ([place]) 06/2014  . Sinus drainage   . Type II diabetes mellitus ([place])      Social History   Socioeconomic History  . Marital status: Married    Spouse name: Not on file  . Number of children: Not on file  . Years of education: Not on file  . Highest education level: Not on file  Occupational History  . Not on file  Tobacco Use  . Smoking status: Never Smoker  . Smokeless tobacco: Never Used  Substance and Sexual Activity  . Alcohol use: No  . Drug use: No  . Sexual activity: Not on file  Other Topics Concern  . Not on file  Social History Narrative  . Not on file   Social Determinants of Health   Financial Resource Strain: Low Risk   . Difficulty of Paying Living Expenses: Not hard at all  Food Insecurity: No Food Insecurity  . Worried About Charity fundraiser in the Last Year: Never true  . Ran Out of Food in the Last Year: Never true  Transportation Needs: No Transportation Needs  . Lack of Transportation (Medical): No  . Lack of Transportation (Non-Medical): No  Physical Activity: Unknown  . Days of Exercise per Week: 3 days  . Minutes of Exercise per Session: Not on file  Stress: No Stress Concern Present  . Feeling of Stress : Not at all  Social Connections:   . Frequency of Communication with Friends and Family: Not on file  . Frequency of Social Gatherings with Friends and Family: Not on file  . Attends Religious Services: Not on file  . Active Member of Clubs or Organizations: Not on file  . Attends Archivist Meetings: Not on file  . Marital Status: Not on file  Intimate Partner Violence: Not At Risk  . Fear of Current or Ex-Partner: No  . Emotionally Abused: No  . Physically Abused: No  . Sexually Abused: No    Past Surgical History:  Procedure Laterality Date  . APPENDECTOMY    . CARDIAC CATHETERIZATION N/A 05/12/2014   Procedure: Left Heart Cath and Coronary Angiography;  Surgeon:  Troy Sine, MD;  Location: [place] [facility];  Service: Cardiovascular;  Laterality: N/A;  . CARDIAC CATHETERIZATION N/A 05/13/2014   Procedure: Coronary/Graft Atherectomy;  Surgeon: Troy Sine, MD;  Location: [facility] [facility];  Service: Cardiovascular;  Laterality: N/A;  . CARDIAC CATHETERIZATION Right 05/13/2014   Procedure: Temporary Pacemaker;  Surgeon: Troy Sine, MD;  Location: [place] [facility];  Service: Cardiovascular;  Laterality: Right;  . CARDIAC CATHETERIZATION N/A 05/13/2014   Procedure: Coronary Stent Intervention;  Surgeon: Troy Sine, MD;  Location: [facility];  Service: Cardiovascular;  Laterality: N/A;  . CARDIAC CATHETERIZATION N/A 06/18/2014   Procedure: Left Heart Cath and Coronary Angiography;  Surgeon: Peter M Martinique, MD;  Location: [facility];  Service: Cardiovascular;  Laterality:  N/A;  . CARDIAC CATHETERIZATION N/A 08/03/2015   Procedure: Left Heart Cath and Cors/Grafts Angiography;  Surgeon: Nelva Bush, MD;  Location: [facility] [facility];  Service: Cardiovascular;  Laterality: N/A;  . CARDIOVASCULAR STRESS TEST  05/06/2014  . CATARACT EXTRACTION W/ INTRAOCULAR LENS IMPLANT Left   . CORONARY ARTERY BYPASS GRAFT N/A 06/22/2014   Procedure: CORONARY ARTERY BYPASS GRAFTING (CABG) x five, using left internal mammary artery, and right leg greater saphenous vein harvested endoscopically;  Surgeon: Melrose Nakayama, MD;  Location: [facility];  Service: Open Heart Surgery;  Laterality: N/A;  . LAPAROSCOPIC CHOLECYSTECTOMY    . MAZE N/A 06/22/2014   Procedure: MAZE;  Surgeon: Melrose Nakayama, MD;  Location: [facility];  Service: Open Heart Surgery;  Laterality: N/A;  . TEE WITHOUT CARDIOVERSION N/A 06/22/2014   Procedure: TRANSESOPHAGEAL ECHOCARDIOGRAM (TEE);  Surgeon: Melrose Nakayama, MD;  Location: [facility];  Service: Open Heart Surgery;  Laterality: N/A;    Family History  Problem Relation Age of Onset  . Alzheimer's disease Mother   .  Emphysema Father   . Cancer Brother   . Arrhythmia Sister   . Heart attack Sister   . Heart disease Sister   . Hyperlipidemia Sister   . Hypertension Sister     Allergies  Allergen Reactions  . Lipitor [Atorvastatin] Other (See Comments)    Myopathy Spring 2006  . Pravastatin Other (See Comments)    LEG CRAMPS  . Simvastatin Other (See Comments)    LEG CRAMPS    Current Outpatient Medications on File Prior to Visit  Medication Sig Dispense Refill  . albuterol (VENTOLIN HFA) 108 (90 Base) MCG/ACT inhaler Inhale 2 puffs into the lungs every 4 (four) hours as needed for wheezing or shortness of breath (cough, shortness of breath or wheezing.). 18 g 0  . aspirin 81 MG tablet Take 81 mg by mouth daily.    . carvedilol (COREG) 6.25 MG tablet TAKE 1 TABLET BY MOUTH TWICE A DAY WITH FOOD 180 tablet 3  . ELIQUIS 5 MG TABS tablet TAKE 1 TABLET BY MOUTH TWICE A DAY 60 tablet 5  . fexofenadine (ALLEGRA) 180 MG tablet TAKE 1 TABLET (180 MG TOTAL) BY MOUTH DAILY. FOR ALLERGIES. 90 tablet 0  . fluorometholone (FML) 0.1 % ophthalmic suspension Place 1 drop into the right eye daily as needed (for eye redness and itching).     . fluticasone (FLONASE) 50 MCG/ACT nasal spray PLACE 1 SPRAY INTO BOTH NOSTRILS 2 (TWO) TIMES DAILY AS NEEDED FOR ALLERGIES OR RHINITIS. 16 mL 2  . insulin NPH Human (NOVOLIN N RELION) 100 UNIT/ML injection Inject 50 units into the skin every morning and 50 units into the skin in the evening. 30 mL 5  . Insulin Pen Needle 31G X 8 MM MISC Use as directed with Lantus. 100 each 5  . isosorbide mononitrate (IMDUR) 30 MG 24 hr tablet Take 1 tablet (30 mg total) by mouth daily. 90 tablet 3  . lisinopril (ZESTRIL) 5 MG tablet TAKE 1 TABLET (5 MG TOTAL) DAILY BY MOUTH. 90 tablet 3  . MAGNESIUM PO Take 1 tablet by mouth daily as needed (for leg cramping).     . nitroGLYCERIN (NITROSTAT) 0.4 MG SL tablet Place 1 tablet (0.4 mg total) under the tongue every 5 (five) minutes as needed for  chest pain. 25 tablet 0  . rosuvastatin (CRESTOR) 10 MG tablet Take 1 tablet (10 mg total) by mouth daily. 90 tablet 3  . tamsulosin (FLOMAX)  0.4 MG CAPS capsule TAKE 1 CAPSULE (0.4 MG TOTAL) BY MOUTH AS NEEDED. FOR DIFFICULTY URINATING. 90 capsule 1   No current facility-administered medications on file prior to visit.    BP 130/79   Pulse 75   Temp (!) 97 F (36.1 C)    Objective:   Physical Exam  Constitutional: He is oriented to person, place, and time. He appears well-nourished. He does not have a sickly appearance. He does not appear ill.  Respiratory: Effort normal.  Neurological: He is alert and oriented to person, place, and time.  Psychiatric: He has a normal mood and affect.           Assessment & Plan:

## 2018-12-26 NOTE — Patient Instructions (Signed)
Stop eating fried/fatty/greasy food. Switch to a bland diet.  Start Imodium tomorrow if your stools do not reduce in frequency.  Switch to low calorie Gatorade/low calorie ginger ale.   Please go to the hospital if you start to vomit, your diarrhea progresses, you start running fevers, you can't stay hydrated.  It was a pleasure to see you today! [name], NP-C

## 2018-12-26 NOTE — Telephone Encounter (Signed)
Pt has had watery diarrhea since 12/24/18; pt has cramps all over body; in the last 24 hrs pt said he has gone 4 - 5 times an hr with diarrhea. Pt said he is drinking but not eating a lot. Pt said he does not feel bad, urinating normally, no dizziness, no abd pain and no dry mouth.Pt has no covid symptoms except diarrhea, no travel and no known exposure to + covid.  Pt said he cannot go anywhere because he cannot leave bathroom. Pt request virtual visit with Gentry Fitz NP today; scheduled at 10;20. ED precautions given and pt voiced understanding.

## 2019-01-03 DIAGNOSIS — E1165 Type 2 diabetes mellitus with hyperglycemia: Secondary | ICD-10-CM | POA: Diagnosis not present

## 2019-01-03 DIAGNOSIS — R4020 Unspecified coma: Secondary | ICD-10-CM | POA: Diagnosis not present

## 2019-01-03 DIAGNOSIS — R55 Syncope and collapse: Secondary | ICD-10-CM | POA: Diagnosis not present

## 2019-01-05 ENCOUNTER — Emergency Department (HOSPITAL_COMMUNITY): Payer: Medicare Other

## 2019-01-05 ENCOUNTER — Encounter (HOSPITAL_COMMUNITY): Payer: Self-pay | Admitting: Emergency Medicine

## 2019-01-05 ENCOUNTER — Inpatient Hospital Stay (HOSPITAL_COMMUNITY)
Admission: EM | Admit: 2019-01-05 | Discharge: 2019-01-07 | DRG: 225 | Disposition: A | Discharge status: Home / Self Care | Payer: Medicare Other | Attending: Internal Medicine | Admitting: Internal Medicine

## 2019-01-05 ENCOUNTER — Other Ambulatory Visit: Payer: Self-pay

## 2019-01-05 DIAGNOSIS — Z8249 Family history of ischemic heart disease and other diseases of the circulatory system: Secondary | ICD-10-CM

## 2019-01-05 DIAGNOSIS — I472 Ventricular tachycardia: Principal | ICD-10-CM | POA: Diagnosis present

## 2019-01-05 DIAGNOSIS — I428 Other cardiomyopathies: Secondary | ICD-10-CM | POA: Diagnosis present

## 2019-01-05 DIAGNOSIS — I110 Hypertensive heart disease with heart failure: Secondary | ICD-10-CM | POA: Diagnosis present

## 2019-01-05 DIAGNOSIS — Z820 Family history of epilepsy and other diseases of the nervous system: Secondary | ICD-10-CM

## 2019-01-05 DIAGNOSIS — I25810 Atherosclerosis of coronary artery bypass graft(s) without angina pectoris: Secondary | ICD-10-CM | POA: Diagnosis present

## 2019-01-05 DIAGNOSIS — M545 Low back pain: Secondary | ICD-10-CM | POA: Diagnosis present

## 2019-01-05 DIAGNOSIS — Z95810 Presence of automatic (implantable) cardiac defibrillator: Secondary | ICD-10-CM

## 2019-01-05 DIAGNOSIS — Z951 Presence of aortocoronary bypass graft: Secondary | ICD-10-CM

## 2019-01-05 DIAGNOSIS — R55 Syncope and collapse: Secondary | ICD-10-CM

## 2019-01-05 DIAGNOSIS — Z8349 Family history of other endocrine, nutritional and metabolic diseases: Secondary | ICD-10-CM

## 2019-01-05 DIAGNOSIS — I471 Supraventricular tachycardia: Secondary | ICD-10-CM | POA: Diagnosis present

## 2019-01-05 DIAGNOSIS — E782 Mixed hyperlipidemia: Secondary | ICD-10-CM

## 2019-01-05 DIAGNOSIS — Z888 Allergy status to other drugs, medicaments and biological substances status: Secondary | ICD-10-CM

## 2019-01-05 DIAGNOSIS — I25710 Atherosclerosis of autologous vein coronary artery bypass graft(s) with unstable angina pectoris: Secondary | ICD-10-CM | POA: Diagnosis not present

## 2019-01-05 DIAGNOSIS — Z79899 Other long term (current) drug therapy: Secondary | ICD-10-CM

## 2019-01-05 DIAGNOSIS — I082 Rheumatic disorders of both aortic and tricuspid valves: Secondary | ICD-10-CM | POA: Diagnosis present

## 2019-01-05 DIAGNOSIS — Z809 Family history of malignant neoplasm, unspecified: Secondary | ICD-10-CM | POA: Diagnosis not present

## 2019-01-05 DIAGNOSIS — I25110 Atherosclerotic heart disease of native coronary artery with unstable angina pectoris: Secondary | ICD-10-CM | POA: Diagnosis present

## 2019-01-05 DIAGNOSIS — I252 Old myocardial infarction: Secondary | ICD-10-CM

## 2019-01-05 DIAGNOSIS — G8929 Other chronic pain: Secondary | ICD-10-CM | POA: Diagnosis present

## 2019-01-05 DIAGNOSIS — I480 Paroxysmal atrial fibrillation: Secondary | ICD-10-CM | POA: Diagnosis present

## 2019-01-05 DIAGNOSIS — Z825 Family history of asthma and other chronic lower respiratory diseases: Secondary | ICD-10-CM | POA: Diagnosis not present

## 2019-01-05 DIAGNOSIS — I255 Ischemic cardiomyopathy: Secondary | ICD-10-CM | POA: Diagnosis present

## 2019-01-05 DIAGNOSIS — I4891 Unspecified atrial fibrillation: Secondary | ICD-10-CM | POA: Diagnosis not present

## 2019-01-05 DIAGNOSIS — Z794 Long term (current) use of insulin: Secondary | ICD-10-CM

## 2019-01-05 DIAGNOSIS — I10 Essential (primary) hypertension: Secondary | ICD-10-CM | POA: Diagnosis not present

## 2019-01-05 DIAGNOSIS — E1165 Type 2 diabetes mellitus with hyperglycemia: Secondary | ICD-10-CM | POA: Diagnosis present

## 2019-01-05 DIAGNOSIS — Z20822 Contact with and (suspected) exposure to covid-19: Secondary | ICD-10-CM | POA: Diagnosis present

## 2019-01-05 DIAGNOSIS — I2510 Atherosclerotic heart disease of native coronary artery without angina pectoris: Secondary | ICD-10-CM

## 2019-01-05 DIAGNOSIS — E785 Hyperlipidemia, unspecified: Secondary | ICD-10-CM | POA: Diagnosis present

## 2019-01-05 DIAGNOSIS — I25118 Atherosclerotic heart disease of native coronary artery with other forms of angina pectoris: Secondary | ICD-10-CM

## 2019-01-05 DIAGNOSIS — Z6831 Body mass index (BMI) 31.0-31.9, adult: Secondary | ICD-10-CM

## 2019-01-05 DIAGNOSIS — K589 Irritable bowel syndrome without diarrhea: Secondary | ICD-10-CM | POA: Diagnosis present

## 2019-01-05 DIAGNOSIS — I5022 Chronic systolic (congestive) heart failure: Secondary | ICD-10-CM | POA: Diagnosis present

## 2019-01-05 DIAGNOSIS — Z955 Presence of coronary angioplasty implant and graft: Secondary | ICD-10-CM | POA: Diagnosis not present

## 2019-01-05 DIAGNOSIS — I200 Unstable angina: Secondary | ICD-10-CM | POA: Diagnosis present

## 2019-01-05 DIAGNOSIS — I493 Ventricular premature depolarization: Secondary | ICD-10-CM | POA: Diagnosis present

## 2019-01-05 DIAGNOSIS — E669 Obesity, unspecified: Secondary | ICD-10-CM | POA: Diagnosis present

## 2019-01-05 DIAGNOSIS — I340 Nonrheumatic mitral (valve) insufficiency: Secondary | ICD-10-CM | POA: Diagnosis not present

## 2019-01-05 DIAGNOSIS — Z9049 Acquired absence of other specified parts of digestive tract: Secondary | ICD-10-CM | POA: Diagnosis not present

## 2019-01-05 DIAGNOSIS — R072 Precordial pain: Secondary | ICD-10-CM | POA: Diagnosis not present

## 2019-01-05 DIAGNOSIS — Z959 Presence of cardiac and vascular implant and graft, unspecified: Secondary | ICD-10-CM

## 2019-01-05 DIAGNOSIS — R079 Chest pain, unspecified: Secondary | ICD-10-CM

## 2019-01-05 DIAGNOSIS — I361 Nonrheumatic tricuspid (valve) insufficiency: Secondary | ICD-10-CM | POA: Diagnosis not present

## 2019-01-05 DIAGNOSIS — Z7901 Long term (current) use of anticoagulants: Secondary | ICD-10-CM

## 2019-01-05 HISTORY — DX: Unstable angina: I20.0

## 2019-01-05 LAB — BASIC METABOLIC PANEL
Anion gap: 11 (ref 5–15)
BUN: 12 mg/dL (ref 8–23)
CO2: 24 mmol/L (ref 22–32)
Calcium: 9 mg/dL (ref 8.9–10.3)
Chloride: 103 mmol/L (ref 98–111)
Creatinine, Ser: 1.14 mg/dL (ref 0.61–1.24)
GFR calc Af Amer: >60 mL/min (ref >60)
GFR calc non Af Amer: >60 mL/min (ref >60)
Glucose, Bld: 142 mg/dL — ABNORMAL HIGH (ref 70–99)
Potassium: 3.7 mmol/L (ref 3.5–5.1)
Sodium: 138 mmol/L (ref 135–145)

## 2019-01-05 LAB — CBC
HCT: 43 % (ref 39.0–52.0)
Hemoglobin: 13.9 g/dL (ref 13.0–17.0)
MCH: 29.3 pg (ref 26.0–34.0)
MCHC: 32.3 g/dL (ref 30.0–36.0)
MCV: 90.5 fL (ref 80.0–100.0)
Platelets: 234 10*3/uL (ref 150–400)
RBC: 4.75 MIL/uL (ref 4.22–5.81)
RDW: 14.2 % (ref 11.5–15.5)
WBC: 6.3 10*3/uL (ref 4.0–10.5)
nRBC: 0 % (ref 0.0–0.2)

## 2019-01-05 LAB — HEPATIC FUNCTION PANEL
ALT: 77 U/L — ABNORMAL HIGH (ref 0–44)
AST: 27 U/L (ref 15–41)
Albumin: 3.4 g/dL — ABNORMAL LOW (ref 3.5–5.0)
Alkaline Phosphatase: 78 U/L (ref 38–126)
Bilirubin, Direct: 0.2 mg/dL (ref 0.0–0.2)
Indirect Bilirubin: 1.1 mg/dL — ABNORMAL HIGH (ref 0.3–0.9)
Total Bilirubin: 1.3 mg/dL — ABNORMAL HIGH (ref 0.3–1.2)
Total Protein: 7.2 g/dL (ref 6.5–8.1)

## 2019-01-05 LAB — URINALYSIS, ROUTINE W REFLEX MICROSCOPIC
Bacteria, UA: NONE SEEN
Bilirubin Urine: NEGATIVE
Glucose, UA: 150 mg/dL — AB
Ketones, ur: NEGATIVE mg/dL
Leukocytes,Ua: NEGATIVE
Nitrite: NEGATIVE
Protein, ur: 30 mg/dL — AB
Specific Gravity, Urine: 1.019 (ref 1.005–1.030)
pH: 5 (ref 5.0–8.0)

## 2019-01-05 LAB — CBG MONITORING, ED: Glucose-Capillary: 145 mg/dL — ABNORMAL HIGH (ref 70–99)

## 2019-01-05 LAB — TROPONIN I (HIGH SENSITIVITY)
Troponin I (High Sensitivity): 17 ng/L (ref <18)
Troponin I (High Sensitivity): 19 ng/L — ABNORMAL HIGH (ref <18)

## 2019-01-05 LAB — LIPASE, BLOOD: Lipase: 29 U/L (ref 11–51)

## 2019-01-05 LAB — GLUCOSE, CAPILLARY: Glucose-Capillary: 137 mg/dL — ABNORMAL HIGH (ref 70–99)

## 2019-01-05 LAB — SARS CORONAVIRUS 2 (TAT 6-24 HRS): SARS Coronavirus 2: NEGATIVE

## 2019-01-05 MED ORDER — SODIUM CHLORIDE 0.9% FLUSH: 3.0000 mL | Freq: Two times a day (BID) | INTRAVENOUS | Status: DC

## 2019-01-05 MED ORDER — SODIUM CHLORIDE 0.9 % IV SOLN: 250.0000 mL | INTRAVENOUS | Status: DC | PRN

## 2019-01-05 MED ORDER — INSULIN NPH (HUMAN) (ISOPHANE) 100 UNIT/ML ~~LOC~~ SUSP
10.0000 [IU] | Freq: Every day | SUBCUTANEOUS | Status: DC
  Filled 2019-01-05: qty 10

## 2019-01-05 MED ORDER — ACETAMINOPHEN 325 MG PO TABS: 650.0000 mg | ORAL_TABLET | ORAL | Status: DC | PRN

## 2019-01-05 MED ORDER — SODIUM CHLORIDE 0.9% FLUSH: 3.0000 mL | INTRAVENOUS | Status: DC | PRN

## 2019-01-05 MED ORDER — SODIUM CHLORIDE 0.9 % WEIGHT BASED INFUSION: 3.0000 mL/kg/h | INTRAVENOUS | Status: DC

## 2019-01-05 MED ORDER — LISINOPRIL 5 MG PO TABS
5.0000 mg | ORAL_TABLET | Freq: Every day | ORAL | Status: DC
  Administered 2019-01-06 – 2019-01-07 (×2): 5 mg via ORAL
  Filled 2019-01-05 (×2): qty 1

## 2019-01-05 MED ORDER — ROSUVASTATIN CALCIUM 5 MG PO TABS
10.0000 mg | ORAL_TABLET | Freq: Every day | ORAL | Status: DC
  Administered 2019-01-05 – 2019-01-07 (×3): 10 mg via ORAL
  Filled 2019-01-05 (×3): qty 2

## 2019-01-05 MED ORDER — SODIUM CHLORIDE 0.9 % WEIGHT BASED INFUSION: 1.0000 mL/kg/h | INTRAVENOUS | Status: DC

## 2019-01-05 MED ORDER — SODIUM CHLORIDE 0.9% FLUSH
3.0000 mL | Freq: Once | INTRAVENOUS | Status: AC
  Administered 2019-01-05: 3 mL via INTRAVENOUS

## 2019-01-05 MED ORDER — HEPARIN (PORCINE) 25000 UT/250ML-% IV SOLN
1300.0000 [IU]/h | INTRAVENOUS | Status: DC
  Administered 2019-01-05: 1050 [IU]/h via INTRAVENOUS
  Filled 2019-01-05: qty 250

## 2019-01-05 MED ORDER — ONDANSETRON HCL 4 MG/2ML IJ SOLN: 4.0000 mg | Freq: Four times a day (QID) | INTRAMUSCULAR | Status: DC | PRN

## 2019-01-05 MED ORDER — CARVEDILOL 6.25 MG PO TABS
6.2500 mg | ORAL_TABLET | Freq: Two times a day (BID) | ORAL | Status: DC
  Administered 2019-01-05 – 2019-01-07 (×5): 6.25 mg via ORAL
  Filled 2019-01-05 (×5): qty 1

## 2019-01-05 MED ORDER — NITROGLYCERIN 0.4 MG SL SUBL: 0.4000 mg | SUBLINGUAL_TABLET | SUBLINGUAL | Status: DC | PRN

## 2019-01-05 MED ORDER — ASPIRIN EC 81 MG PO TBEC
81.0000 mg | DELAYED_RELEASE_TABLET | Freq: Every day | ORAL | Status: DC
  Administered 2019-01-07: 81 mg via ORAL
  Filled 2019-01-05: qty 1

## 2019-01-05 MED ORDER — ISOSORBIDE MONONITRATE ER 30 MG PO TB24
30.0000 mg | ORAL_TABLET | Freq: Every day | ORAL | Status: DC
  Administered 2019-01-06 – 2019-01-07 (×2): 30 mg via ORAL
  Filled 2019-01-05 (×2): qty 1

## 2019-01-05 NOTE — Consult Note (Addendum)
Cardiology Consultation:   Patient ID: [name] MRN: [id number]; DOB: [birth year]  Admit date: 01/05/2019 Date of Consult: 01/05/2019  Primary Care Provider: [name], NP Primary Cardiologist: Larae Grooms, MD   Patient Profile:   [name] is a 74 y.o. male with a hx of CAD, status post CABG, who is being seen today for the evaluation of exertional dizziness, syncope, worsening dyspnea on exertion at the request of Lennice Sites, DO.  History of Present Illness:   [name] is a 74 y.o. male, patient of Dr. Irish Lack,  with history of diabetes mellitus, CAD, status post LAD PCI 5/16 with rotational atherectomy. He then had early restenosis and progression of left sided disease. He had surgery with Dr. Roxan Hockey. He is post-CABG; after having surgery in June 2016. He has some toe numbness intermittently. His surgery was June 22, 2014. In addition he had a maze procedure and a left atrial appendage clip was placed. He remains on anticoagulation. During his initial hospitalization his EF was in the 40-45% range. EF had normalized at 55%.   He had a NSTEMI in Augist 2017. EF by cath was 25-30% EF by echo was 50-55%. SVG to RCAoccludedbut medically managed.  He was last seen in our clinic a year ago when he was doing well was fairly asymptomatic and able to play golf 3 times a week.    He presented today with exertional dizziness, as an example he states time when he was walking on golf course and after very short help he develop dizziness associated with shortness of breath.  He has also noticed overall worsening fatigue in the last 2 weeks as well as dyspnea on exertion after walking short distances such as flight of stairs.  On Friday he was trying to pick up his cat and when he got up he got dizzy and passed out, this was witnessed by his family, he did not have any head injury, he recovered quickly, not associated with urinary incontinence.  He has also been  filling pressure-like left-sided chest pain in the last 2 days.  He states that prior to his bypass surgery his main presentation was exertional dizziness and shortness of breath.  Initial troponin is 17, EKG in the ER shows normal sinus rhythm with PACs, PVC and nonspecific ST-T wave abnormalities unchanged from prior EKG on January 14, 2018.  His labs are unremarkable.  Chest x-ray does not show any acute changes.  Heart Pathway Score:     Past Medical History:  Diagnosis Date  . Arthritis    "fingers, shoulders" (08/03/2015)  . Atrial fibrillation (HCC)    Paroxysmal, rare episodes, sinus rhythm on flecainide, patient prefers not to take Coumadin  . Bradycardia    April, 2013  . Chest pain    Nuclear, 2006, no ischemia  . Chronic lower back pain    "all my life"  . Coronary artery disease    s/p staged cath 05/12/2014 and 5/11, DES x 2 to heavily calcified RCA, residual with 60% prox LAD, 70% D1  . Ejection fraction    EF 55-60%,  echo, January, 2011  . Elevated CPK    CPK elevated with normal MB and normal troponin the past  . Heart murmur   . History of echocardiogram    Echo 8/16: EF 55%, normal wall motion, grade 2 diastolic dysfunction, trivial MR, normal RV function, PASP 27 mmHg  . Hypertension   . IBS (irritable bowel syndrome)   . Myocardial  infarction ([place]) 06/2014  . Sinus drainage   . Type II diabetes mellitus ([place])     Past Surgical History:  Procedure Laterality Date  . APPENDECTOMY    . CARDIAC CATHETERIZATION N/A 05/12/2014   Procedure: Left Heart Cath and Coronary Angiography;  Surgeon: Troy Sine, MD;  Location: [facility];  Service: Cardiovascular;  Laterality: N/A;  . CARDIAC CATHETERIZATION N/A 05/13/2014   Procedure: Coronary/Graft Atherectomy;  Surgeon: Troy Sine, MD;  Location: [facility];  Service: Cardiovascular;  Laterality: N/A;  . CARDIAC CATHETERIZATION Right 05/13/2014   Procedure: Temporary Pacemaker;  Surgeon: Troy Sine, MD;  Location: [facility];  Service: Cardiovascular;  Laterality: Right;  . CARDIAC CATHETERIZATION N/A 05/13/2014   Procedure: Coronary Stent Intervention;  Surgeon: Troy Sine, MD;  Location: [facility];  Service: Cardiovascular;  Laterality: N/A;  . CARDIAC CATHETERIZATION N/A 06/18/2014   Procedure: Left Heart Cath and Coronary Angiography;  Surgeon: Peter M Martinique, MD;  Location: [place] [facility];  Service: Cardiovascular;  Laterality: N/A;  . CARDIAC CATHETERIZATION N/A 08/03/2015   Procedure: Left Heart Cath and Cors/Grafts Angiography;  Surgeon: Nelva Bush, MD;  Location: [facility];  Service: Cardiovascular;  Laterality: N/A;  . CARDIOVASCULAR STRESS TEST  05/06/2014  . CATARACT EXTRACTION W/ INTRAOCULAR LENS IMPLANT Left   . CORONARY ARTERY BYPASS GRAFT N/A 06/22/2014   Procedure: CORONARY ARTERY BYPASS GRAFTING (CABG) x five, using left internal mammary artery, and right leg greater saphenous vein harvested endoscopically;  Surgeon: Melrose Nakayama, MD;  Location: [facility];  Service: Open Heart Surgery;  Laterality: N/A;  . LAPAROSCOPIC CHOLECYSTECTOMY    . MAZE N/A 06/22/2014   Procedure: MAZE;  Surgeon: Melrose Nakayama, MD;  Location: [facility];  Service: Open Heart Surgery;  Laterality: N/A;  . TEE WITHOUT CARDIOVERSION N/A 06/22/2014   Procedure: TRANSESOPHAGEAL ECHOCARDIOGRAM (TEE);  Surgeon: Melrose Nakayama, MD;  Location: [facility];  Service: Open Heart Surgery;  Laterality: N/A;    Inpatient Medications: Scheduled Meds: . sodium chloride flush  3 mL Intravenous Once   Continuous Infusions:  PRN Meds:  Allergies:    Allergies  Allergen Reactions  . Lipitor [Atorvastatin] Other (See Comments)    Myopathy Spring 2006  . Pravastatin Other (See Comments)    LEG CRAMPS  . Simvastatin Other (See Comments)    LEG CRAMPS   Social History:   Social History   Socioeconomic History  . Marital status: Married    Spouse name: Not on  file  . Number of children: Not on file  . Years of education: Not on file  . Highest education level: Not on file  Occupational History  . Not on file  Tobacco Use  . Smoking status: Never Smoker  . Smokeless tobacco: Never Used  Substance and Sexual Activity  . Alcohol use: No  . Drug use: No  . Sexual activity: Not on file  Other Topics Concern  . Not on file  Social History Narrative  . Not on file   Social Determinants of Health   Financial Resource Strain: Low Risk   . Difficulty of Paying Living Expenses: Not hard at all  Food Insecurity: No Food Insecurity  . Worried About Charity fundraiser in the Last Year: Never true  . Ran Out of Food in the Last Year: Never true  Transportation Needs: No Transportation Needs  . Lack of Transportation (Medical): No  .  Lack of Transportation (Non-Medical): No  Physical Activity: Unknown  . Days of Exercise per Week: 3 days  . Minutes of Exercise per Session: Not on file  Stress: No Stress Concern Present  . Feeling of Stress : Not at all  Social Connections:   . Frequency of Communication with Friends and Family: Not on file  . Frequency of Social Gatherings with Friends and Family: Not on file  . Attends Religious Services: Not on file  . Active Member of Clubs or Organizations: Not on file  . Attends Archivist Meetings: Not on file  . Marital Status: Not on file  Intimate Partner Violence: Not At Risk  . Fear of Current or Ex-Partner: No  . Emotionally Abused: No  . Physically Abused: No  . Sexually Abused: No    Family History:    Family History  Problem Relation Age of Onset  . Alzheimer's disease Mother   . Emphysema Father   . Cancer Brother   . Arrhythmia Sister   . Heart attack Sister   . Heart disease Sister   . Hyperlipidemia Sister   . Hypertension Sister     ROS:  Please see the history of present illness.  All other ROS reviewed and negative.     Physical Exam/Data:   Vitals:    01/05/19 0834 01/05/19 1132 01/05/19 1145 01/05/19 1153  BP: 129/86 132/79 (!) 148/71   Pulse: 73  70   Resp: 16 14 (!) 21   Temp: 97.8 F (36.6 C) 98.3 F (36.8 C)    TempSrc: Oral Oral    SpO2: 99% 98% 99% 97%   No intake or output data in the 24 hours ending 01/05/19 1247 Last 3 Weights 12/18/2018 09/17/2018 09/13/2018  Weight (lbs) 212 lb 219 lb 4 oz 211 lb  Weight (kg) 96.163 kg 99.451 kg 95.709 kg     There is no height or weight on file to calculate BMI.  General:  Well nourished, well developed, in no acute distress HEENT: normal Lymph: no adenopathy Neck: no JVD Endocrine:  No thryomegaly Vascular: No carotid bruits; FA pulses 2+ bilaterally without bruits  Cardiac:  normal S1, S2; RRR; no murmur  Lungs:  clear to auscultation bilaterally, no wheezing, rhonchi or rales  Abd: soft, nontender, no hepatomegaly  Ext: no edema, poor peripheral pulses in his lower extremities Musculoskeletal:  No deformities, BUE and BLE strength normal and equal Skin: warm and dry  Neuro:  CNs 2-12 intact, no focal abnormalities noted Psych:  Normal affect   EKG:  The EKG was personally reviewed and demonstrates: Simona Huh rhythm with PACs, PVC otherwise nonspecific ST-T wave abnormalities unchanged from January 14, 2018. Telemetry:  Telemetry was personally reviewed and demonstrates: Sinus rhythm Relevant CV Studies:  Laboratory Data:  High Sensitivity Troponin:   Recent Labs  Lab 01/05/19 0846  TROPONINIHS 17     Chemistry Recent Labs  Lab 01/05/19 0846  NA 138  K 3.7  CL 103  CO2 24  GLUCOSE 142*  BUN 12  CREATININE 1.14  CALCIUM 9.0  GFRNONAA >60  GFRAA >60  ANIONGAP 11    No results for input(s): PROT, ALBUMIN, AST, ALT, ALKPHOS, BILITOT in the last 168 hours. Hematology Recent Labs  Lab 01/05/19 0846  WBC 6.3  RBC 4.75  HGB 13.9  HCT 43.0  MCV 90.5  MCH 29.3  MCHC 32.3  RDW 14.2  PLT 234   BNPNo results for input(s): BNP, PROBNP in the last 168  hours.    DDimer No results for input(s): DDIMER in the last 168 hours.  Radiology/Studies:  DG Chest 2 View  Result Date: 01/05/2019 CLINICAL DATA:  C/o intermittent L sided chest pain x 2 weeks with SOB and dizziness. Reports vomiting last week. Hx CABG x5 2016 EXAM: CHEST - 2 VIEW COMPARISON:  Chest radiograph 01/09/2018 FINDINGS: Stable cardiomediastinal contours status post median sternotomy and CABG. The lungs are clear. No pneumothorax or pleural effusion. No acute finding in the visualized skeleton. IMPRESSION: No acute cardiopulmonary finding. Electronically Signed   By: Audie Pinto M.D.   On: 01/05/2019 09:09    Assessment and Plan:   1. Exertional dyspnea and dizziness, syncopal episode  This is highly suspicious for angina equivalent, the patient also has overall fatigue.  We will place on a cath scheduled for tomorrow, he understands and agrees, his creatinine is normal.  The only limitation is that he took Eliquis at 5 AM this morning, we will hold from now on start heparin tonight, he might need to be postponed for tomorrow later in the afternoon.  2. CAD: s/p CABG.    Last cath in 2017 showed: Severe 3-vessel native coronary artery disease. 2.  Patent LIMA to LAD. 3.  Patent but small and diffusely diseased sequential SVG to PDA and PL with discrete 90% stenosis in the distal SVG. 4.  Ostially occluded sequential SVG to diagonal and ramus. 5.  Severe global LV dysfunction with LVEF 20-25%. 6.  Mildly elevated left ventricular filling pressure.  Recommendations: 1.  Medical management of severe multivessel coronary artery disease.  Small, diffusely diseased SVG to PDA and PL branch is not a good PCI target. 2.  Transthoracic echocardiogram to further assess LV structure. 3.  Aggressive medical optimization of coronary artery disease and severe systolic heart failure.  3. AFib: No palpitations.  No bleeding problems.  We will hold Eliquis hemoglobin is 13.9, the patient states  that ever since he had Maze procedure during his bypass surgery he has not had any episodes of A. fib he is pretty sure about that as he previously had very symptomatic episodes of A. Fib.  4.Hyperlipidemia: Continue Crestor.    5. Diabetes: A1C elevated.  He would probably benefit from Metformin long-term.   6.  Hypertension we will restart home lisinopril carvedilol and Imdur.   For questions or updates, please contact [place] Please consult [email or web address] for contact info under   Signed, Ena Dawley, MD  01/05/2019 12:47 PM

## 2019-01-05 NOTE — ED Provider Notes (Signed)
[name]   CSN: [id number] Arrival date & time: 01/05/19  0831     History Chief Complaint  Patient presents with  . Chest Pain    [name] is a 74 y.o. male.  The history is provided by the patient.  Chest Pain Pain location:  Substernal area Pain quality: aching and sharp   Pain radiates to:  Does not radiate Pain severity:  Mild Onset quality:  Gradual Duration:  2 weeks Timing:  Intermittent Progression:  Waxing and waning Context: at rest   Relieved by:  Nothing Worsened by:  Nothing Associated symptoms: dizziness (had episode of dizziness and passing out two nights ago. Bent over to pick up cat and got lightheaded and walked around until he passed out. )   Associated symptoms: no abdominal pain, no anxiety, no back pain, no cough, no fever, no headache, no nausea, no numbness, no palpitations, no shortness of breath, no vomiting and no weakness   Risk factors: coronary artery disease, high cholesterol and hypertension   Risk factors: no diabetes mellitus and no prior DVT/PE        Past Medical History:  Diagnosis Date  . Arthritis    "fingers, shoulders" (08/03/2015)  . Atrial fibrillation (HCC)    Paroxysmal, rare episodes, sinus rhythm on flecainide, patient prefers not to take Coumadin  . Bradycardia    April, 2013  . Chest pain    Nuclear, 2006, no ischemia  . Chronic lower back pain    "all my life"  . Coronary artery disease    s/p staged cath 05/12/2014 and 5/11, DES x 2 to heavily calcified RCA, residual with 60% prox LAD, 70% D1  . Ejection fraction    EF 55-60%,  echo, January, 2011  . Elevated CPK    CPK elevated with normal MB and normal troponin the past  . Heart murmur   . History of echocardiogram    Echo 8/16: EF 55%, normal wall motion, grade 2 diastolic dysfunction, trivial MR, normal RV function, PASP 27 mmHg  . Hypertension   . IBS (irritable bowel syndrome)   . Myocardial  infarction ([place]) 06/2014  . Sinus drainage   . Type II diabetes mellitus [facility])     Patient Active Problem List   Diagnosis Date Noted  . Diarrhea 12/26/2018  . Allergic rhinitis 06/21/2018  . NSTEMI (non-ST elevated myocardial infarction) ([place]) 08/02/2015  . Insomnia 07/21/2014  . Hx of CABG 07/19/2014  . Nonsustained ventricular tachycardia ([place]) 06/23/2014  . Cardiomyopathy, ischemic   . CAD (coronary artery disease) of artery bypass graft   . Junctional bradycardia   . CAD (coronary artery disease), native coronary artery 05/12/2014  . Hyperlipidemia 04/21/2013  . NASH (nonalcoholic steatohepatitis) 09/06/2011  . Rotator cuff tendinitis 07/28/2011  . Hypertension   . BPH (benign prostatic hyperplasia) 03/11/2011  . IBS (irritable bowel syndrome) 03/11/2011  . Bursitis of hip 03/11/2011  . DM type 2 (diabetes mellitus, type 2) ([place]) 01/21/2009  . ATRIAL FIBRILLATION, PAROXYSMAL 01/21/2009    Past Surgical History:  Procedure Laterality Date  . APPENDECTOMY    . CARDIAC CATHETERIZATION N/A 05/12/2014   Procedure: Left Heart Cath and Coronary Angiography;  Surgeon: Troy Sine, MD;  Location: [place] [facility];  Service: Cardiovascular;  Laterality: N/A;  . CARDIAC CATHETERIZATION N/A 05/13/2014   Procedure: Coronary/Graft Atherectomy;  Surgeon: Troy Sine, MD;  Location: [place] [facility];  Service: Cardiovascular;  Laterality: N/A;  .  CARDIAC CATHETERIZATION Right 05/13/2014   Procedure: Temporary Pacemaker;  Surgeon: Troy Sine, MD;  Location: [place] [facility];  Service: Cardiovascular;  Laterality: Right;  . CARDIAC CATHETERIZATION N/A 05/13/2014   Procedure: Coronary Stent Intervention;  Surgeon: Troy Sine, MD;  Location: [place] [facility];  Service: Cardiovascular;  Laterality: N/A;  . CARDIAC CATHETERIZATION N/A 06/18/2014   Procedure: Left Heart Cath and Coronary Angiography;  Surgeon: Peter M Martinique, MD;  Location: [place] [facility];  Service:  Cardiovascular;  Laterality: N/A;  . CARDIAC CATHETERIZATION N/A 08/03/2015   Procedure: Left Heart Cath and Cors/Grafts Angiography;  Surgeon: Nelva Bush, MD;  Location: [place] [facility];  Service: Cardiovascular;  Laterality: N/A;  . CARDIOVASCULAR STRESS TEST  05/06/2014  . CATARACT EXTRACTION W/ INTRAOCULAR LENS IMPLANT Left   . CORONARY ARTERY BYPASS GRAFT N/A 06/22/2014   Procedure: CORONARY ARTERY BYPASS GRAFTING (CABG) x five, using left internal mammary artery, and right leg greater saphenous vein harvested endoscopically;  Surgeon: Melrose Nakayama, MD;  Location: [place];  Service: Open Heart Surgery;  Laterality: N/A;  . LAPAROSCOPIC CHOLECYSTECTOMY    . MAZE N/A 06/22/2014   Procedure: MAZE;  Surgeon: Melrose Nakayama, MD;  Location: [place];  Service: Open Heart Surgery;  Laterality: N/A;  . TEE WITHOUT CARDIOVERSION N/A 06/22/2014   Procedure: TRANSESOPHAGEAL ECHOCARDIOGRAM (TEE);  Surgeon: Melrose Nakayama, MD;  Location: [place];  Service: Open Heart Surgery;  Laterality: N/A;       Family History  Problem Relation Age of Onset  . Alzheimer's disease Mother   . Emphysema Father   . Cancer Brother   . Arrhythmia Sister   . Heart attack Sister   . Heart disease Sister   . Hyperlipidemia Sister   . Hypertension Sister     Social History   Tobacco Use  . Smoking status: Never Smoker  . Smokeless tobacco: Never Used  Substance Use Topics  . Alcohol use: No  . Drug use: No    Home Medications Prior to Admission medications   Medication Sig Start Date End Date Taking? Authorizing Provider  albuterol (VENTOLIN HFA) 108 (90 Base) MCG/ACT inhaler Inhale 2 puffs into the lungs every 4 (four) hours as needed for wheezing or shortness of breath (cough, shortness of breath or wheezing.). 09/17/18   [name], NP  aspirin 81 MG tablet Take 81 mg by mouth daily.    [provider]  carvedilol (COREG) 6.25 MG tablet TAKE 1 TABLET BY MOUTH TWICE A DAY  WITH FOOD 05/23/18   Jettie Booze, MD  ELIQUIS 5 MG TABS tablet TAKE 1 TABLET BY MOUTH TWICE A DAY 07/01/18   Jettie Booze, MD  fexofenadine (ALLEGRA) 180 MG tablet TAKE 1 TABLET (180 MG TOTAL) BY MOUTH DAILY. FOR ALLERGIES. 08/01/18   [name], NP  fluorometholone (FML) 0.1 % ophthalmic suspension Place 1 drop into the right eye daily as needed (for eye redness and itching).  06/04/15   [provider]  fluticasone (FLONASE) 50 MCG/ACT nasal spray PLACE 1 SPRAY INTO BOTH NOSTRILS 2 (TWO) TIMES DAILY AS NEEDED FOR ALLERGIES OR RHINITIS. 07/15/18   [name], NP  insulin NPH Human (NOVOLIN N RELION) 100 UNIT/ML injection Inject 50 units into the skin every morning and 50 units into the skin in the evening. 12/18/18   [name], NP  Insulin Pen Needle 31G X 8 MM MISC Use as directed with Lantus. 08/24/15  [name], NP  isosorbide mononitrate (IMDUR) 30 MG 24 hr tablet Take 1 tablet (30 mg total) by mouth daily. 01/31/18   Jettie Booze, MD  lisinopril (ZESTRIL) 5 MG tablet TAKE 1 TABLET (5 MG TOTAL) DAILY BY MOUTH. 11/04/18   Jettie Booze, MD  MAGNESIUM PO Take 1 tablet by mouth daily as needed (for leg cramping).     [provider]  nitroGLYCERIN (NITROSTAT) 0.4 MG SL tablet Place 1 tablet (0.4 mg total) under the tongue every 5 (five) minutes as needed for chest pain. 09/17/18   [name], NP  rosuvastatin (CRESTOR) 10 MG tablet Take 1 tablet (10 mg total) by mouth daily. 01/31/18   Jettie Booze, MD  tamsulosin (FLOMAX) 0.4 MG CAPS capsule TAKE 1 CAPSULE (0.4 MG TOTAL) BY MOUTH AS NEEDED. FOR DIFFICULTY URINATING. 12/03/17   [name], NP    Allergies    Lipitor [atorvastatin], Pravastatin, and Simvastatin  Review of Systems   Review of Systems  Constitutional: Negative for chills and fever.  HENT: Negative for ear pain and sore throat.   Eyes: Negative for pain and visual disturbance.   Respiratory: Negative for cough and shortness of breath.   Cardiovascular: Positive for chest pain. Negative for palpitations.  Gastrointestinal: Negative for abdominal pain, diarrhea, nausea and vomiting.  Genitourinary: Negative for dysuria and hematuria.  Musculoskeletal: Negative for arthralgias and back pain.  Skin: Negative for color change, pallor, rash and wound.  Neurological: Positive for dizziness (had episode of dizziness and passing out two nights ago. Bent over to pick up cat and got lightheaded and walked around until he passed out. ) and syncope. Negative for tremors, seizures, facial asymmetry, speech difficulty, weakness, light-headedness, numbness and headaches.  All other systems reviewed and are negative.   Physical Exam Updated Vital Signs  ED Triage Vitals  Enc Vitals Group     BP 01/05/19 0834 129/86     Pulse Rate 01/05/19 0834 73     Resp 01/05/19 0834 16     Temp 01/05/19 0834 97.8 F (36.6 C)     Temp Source 01/05/19 0834 Oral     SpO2 01/05/19 0834 99 %     Weight --      Height --      Head Circumference --      Peak Flow --      Pain Score 01/05/19 0843 1     Pain Loc --      Pain Edu? --      Excl. in [place]? --     Physical Exam Vitals and nursing [name] reviewed.  Constitutional:      General: He is not in acute distress.    Appearance: He is well-developed. He is not ill-appearing.  HENT:     Head: Normocephalic and atraumatic.  Eyes:     Extraocular Movements: Extraocular movements intact.     Conjunctiva/sclera: Conjunctivae normal.     Pupils: Pupils are equal, round, and reactive to light.  Cardiovascular:     Rate and Rhythm: Normal rate and regular rhythm.     Pulses:          Radial pulses are 2+ on the right side and 2+ on the left side.     Heart sounds: Normal heart sounds. No murmur.  Pulmonary:     Effort: Pulmonary effort is normal. No respiratory distress.     Breath sounds: Normal breath sounds. No decreased breath sounds,  wheezing,  rhonchi or rales.  Abdominal:     Palpations: Abdomen is soft.     Tenderness: There is no abdominal tenderness.  Musculoskeletal:        General: Normal range of motion.     Cervical back: Normal range of motion and neck supple.     Right lower leg: No edema.     Left lower leg: No edema.  Skin:    General: Skin is warm and dry.     Capillary Refill: Capillary refill takes less than 2 seconds.  Neurological:     General: No focal deficit present.     Mental Status: He is alert and oriented to person, place, and time.     Cranial Nerves: No cranial nerve deficit.     Motor: No weakness.     Comments: 5+ out of 5 strength throughout, normal sensation, normal finger-to-nose finger, normal speech, normal gait  Psychiatric:        Mood and Affect: Mood normal.     ED Results / Procedures / Treatments   Labs (all labs ordered are listed, but only abnormal results are displayed) Labs Reviewed  BASIC METABOLIC PANEL - Abnormal; Notable for the following components:      Result Value   Glucose, Bld 142 (*)    All other components within normal limits  SARS CORONAVIRUS 2 (TAT 6-24 HRS)  CBC  HEPATIC FUNCTION PANEL  LIPASE, BLOOD  URINALYSIS, ROUTINE W REFLEX MICROSCOPIC  TROPONIN I (HIGH SENSITIVITY)  TROPONIN I (HIGH SENSITIVITY)    EKG EKG Interpretation  Date/Time:  Sunday January 05 2019 08:45:59 EST Ventricular Rate:  75 PR Interval:  174 QRS Duration: 110 QT Interval:  452 QTC Calculation: 504 R Axis:   105 Text Interpretation: Sinus rhythm with occasional Premature ventricular complexes Rightward axis Nonspecific ST and T wave abnormality Prolonged QT Abnormal ECG Confirmed by Lennice Sites [id number]) on 01/05/2019 11:21:03 AM   Radiology DG Chest 2 View  Result Date: 01/05/2019 CLINICAL DATA:  C/o intermittent L sided chest pain x 2 weeks with SOB and dizziness. Reports vomiting last week. Hx CABG x5 2016 EXAM: CHEST - 2 VIEW COMPARISON:  Chest radiograph  01/09/2018 FINDINGS: Stable cardiomediastinal contours status post median sternotomy and CABG. The lungs are clear. No pneumothorax or pleural effusion. No acute finding in the visualized skeleton. IMPRESSION: No acute cardiopulmonary finding. Electronically Signed   By: Audie Pinto M.D.   On: 01/05/2019 09:09    Procedures Procedures (including critical care time)  Medications Ordered in ED Medications  sodium chloride flush (NS) 0.9 % injection 3 mL (has no administration in time range)    ED Course  I have reviewed the triage vital signs and the nursing notes.  Pertinent labs & imaging results that were available during my care of the patient were reviewed by me and considered in my medical decision making (see chart for details).    MDM Rules/Calculators/A&P  [name] is a 74 year old male with history of CAD, atrial fibrillation on Eliquis who presents to the ED with generalized malaise, chest pain, dizziness.  Patient with unremarkable vitals.  No fever.  Had similar symptoms with his last heart attack.  Had an episode of lightheadedness and syncope 3 days ago and has felt generally weak since.  Has had some intermittent chest pain during this time.  Chest pain usually is at rest.  Denies any shortness of breath, cough, infectious symptoms.  Overall is asymptomatic currently.  No chest pain, no  dizziness.  Patient is neurologically intact.  Chest x-ray has been performed that shows no infectious findings.  No pneumothorax.  No pleural effusion.  EKG shows sinus rhythm.  No ischemic changes.  Initial troponin is normal.  No significant anemia, electrolyte abnormality, kidney injury.  Given his cardiac history and concern for anginal equivalent cardiology was consulted and came down to the ED to evaluate the patient.  They will admit and anticipate likely cardiac catheterization tomorrow.  No PE risk factors.  Patient is on Eliquis.  Doubt PE.  Repeat troponin is pending.  Head CT  has also been ordered in the setting of syncope and fall.  That is pending at the time of admission as well.  This chart was dictated using voice recognition software.  Despite best efforts to proofread,  errors can occur which can change the documentation meaning.    Final Clinical Impression(s) / ED Diagnoses Final diagnoses:  Chest pain, unspecified type    Rx / DC Orders ED Discharge Orders    None       Lennice Sites, DO 01/05/19 1312

## 2019-01-05 NOTE — ED Triage Notes (Signed)
C/o intermittent L sided chest pain x 2 weeks with SOB and dizziness.  Reports vomiting last week.

## 2019-01-05 NOTE — ED Notes (Signed)
Patient eating dinner tray.

## 2019-01-05 NOTE — ED Notes (Signed)
ED TO INPATIENT HANDOFF REPORT  ED Nurse Name and Phone #: Karlon Schlafer [phone]  S Name/Age/Gender [name] 74 y.o. male Room/Bed: 027C/027C  Code Status   Code Status: Full Code  Home/SNF/Other Home Patient oriented to: self, place, time and situation Is this baseline? Yes   Triage Complete: Triage complete  Chief Complaint Unstable angina ([place]) [I20.0]  Triage Note C/o intermittent L sided chest pain x 2 weeks with SOB and dizziness.  Reports vomiting last week.    Allergies Allergies  Allergen Reactions  . Lipitor [Atorvastatin] Other (See Comments)    Myopathy Spring 2006  . Pravastatin Other (See Comments)    LEG CRAMPS  . Simvastatin Other (See Comments)    LEG CRAMPS    Level of Care/Admitting Diagnosis ED Disposition    ED Disposition Condition Comment   Admit  Hospital Area: [facility] [[id number]]  Level of Care: Telemetry Cardiac [[id number]]  Covid Evaluation: Asymptomatic Screening Protocol (No Symptoms)  Diagnosis: Unstable angina [facility]) [[id number]]  Admitting Physician: Dorothy Spark [[id number]]  Attending Physician: Dorothy Spark [[id number]]  Estimated length of stay: inpatient only procedure  Certification:: I certify this patient is being admitted for an inpatient-only procedure       B Medical/Surgery History Past Medical History:  Diagnosis Date  . Arthritis    "fingers, shoulders" (08/03/2015)  . Atrial fibrillation (HCC)    Paroxysmal, rare episodes, sinus rhythm on flecainide, patient prefers not to take Coumadin  . Bradycardia    April, 2013  . Chest pain    Nuclear, 2006, no ischemia  . Chronic lower back pain    "all my life"  . Coronary artery disease    s/p staged cath 05/12/2014 and 5/11, DES x 2 to heavily calcified RCA, residual with 60% prox LAD, 70% D1  . Ejection fraction    EF 55-60%,  echo, January, 2011  . Elevated CPK    CPK elevated with normal MB and normal troponin the past  . Heart murmur   .  History of echocardiogram    Echo 8/16: EF 55%, normal wall motion, grade 2 diastolic dysfunction, trivial MR, normal RV function, PASP 27 mmHg  . Hypertension   . IBS (irritable bowel syndrome)   . Myocardial infarction ([place]) 06/2014  . Sinus drainage   . Type II diabetes mellitus ([place])    Past Surgical History:  Procedure Laterality Date  . APPENDECTOMY    . CARDIAC CATHETERIZATION N/A 05/12/2014   Procedure: Left Heart Cath and Coronary Angiography;  Surgeon: Troy Sine, MD;  Location: [place] [facility];  Service: Cardiovascular;  Laterality: N/A;  . CARDIAC CATHETERIZATION N/A 05/13/2014   Procedure: Coronary/Graft Atherectomy;  Surgeon: Troy Sine, MD;  Location: [place] [facility];  Service: Cardiovascular;  Laterality: N/A;  . CARDIAC CATHETERIZATION Right 05/13/2014   Procedure: Temporary Pacemaker;  Surgeon: Troy Sine, MD;  Location: [place] [facility];  Service: Cardiovascular;  Laterality: Right;  . CARDIAC CATHETERIZATION N/A 05/13/2014   Procedure: Coronary Stent Intervention;  Surgeon: Troy Sine, MD;  Location: [place] [facility];  Service: Cardiovascular;  Laterality: N/A;  . CARDIAC CATHETERIZATION N/A 06/18/2014   Procedure: Left Heart Cath and Coronary Angiography;  Surgeon: Peter M Martinique, MD;  Location: [place] [facility];  Service: Cardiovascular;  Laterality: N/A;  . CARDIAC CATHETERIZATION N/A 08/03/2015   Procedure: Left Heart Cath and Cors/Grafts Angiography;  Surgeon: Nelva Bush, MD;  Location: [place] [facility];  Service: Cardiovascular;  Laterality: N/A;  . CARDIOVASCULAR STRESS TEST  05/06/2014  . CATARACT EXTRACTION W/ INTRAOCULAR LENS IMPLANT Left   . CORONARY ARTERY BYPASS GRAFT N/A 06/22/2014   Procedure: CORONARY ARTERY BYPASS GRAFTING (CABG) x five, using left internal mammary artery, and right leg greater saphenous vein harvested endoscopically;  Surgeon: Melrose Nakayama, MD;  Location: [facility];  Service: Open Heart Surgery;   Laterality: N/A;  . LAPAROSCOPIC CHOLECYSTECTOMY    . MAZE N/A 06/22/2014   Procedure: MAZE;  Surgeon: Melrose Nakayama, MD;  Location: [facility];  Service: Open Heart Surgery;  Laterality: N/A;  . TEE WITHOUT CARDIOVERSION N/A 06/22/2014   Procedure: TRANSESOPHAGEAL ECHOCARDIOGRAM (TEE);  Surgeon: Melrose Nakayama, MD;  Location: [facility];  Service: Open Heart Surgery;  Laterality: N/A;     A IV Location/Drains/Wounds Patient Lines/Drains/Airways Status   Active Line/Drains/Airways    Name:   Placement date:   Placement time:   Site:   Days:   Peripheral IV Left Hand   --    --    Hand      Peripheral IV Right Hand   --    --    Hand      Peripheral IV 08/02/15 Right Antecubital   08/02/15    1600    Antecubital   1252   Peripheral IV 08/03/15 Left;Anterior Forearm   08/03/15    0520    Forearm   1251   Peripheral IV 01/05/19 Left Forearm   01/05/19    1435    Forearm   less than 1   Incision (Closed) 06/22/14 Chest Other (Comment)   06/22/14    1329     1658   Incision (Closed) 06/22/14 Leg Right   06/22/14    1329     1658          Intake/Output Last 24 hours No intake or output data in the 24 hours ending 01/05/19 1842  Labs/Imaging Results for orders placed or performed during the hospital encounter of 01/05/19 (from the past 48 hour(s))  Basic metabolic panel     Status: Abnormal   Collection Time: 01/05/19  8:46 AM  Result Value Ref Range   Sodium 138 135 - 145 mmol/L   Potassium 3.7 3.5 - 5.1 mmol/L   Chloride 103 98 - 111 mmol/L   CO2 24 22 - 32 mmol/L   Glucose, Bld 142 (H) 70 - 99 mg/dL   BUN 12 8 - 23 mg/dL   Creatinine, Ser 1.14 0.61 - 1.24 mg/dL   Calcium 9.0 8.9 - 10.3 mg/dL   GFR calc non Af Amer >60 >60 mL/min   GFR calc Af Amer >60 >60 mL/min   Anion gap 11 5 - 15    Comment: Performed at [facility], [place] [address] [place] [address]  CBC     Status: None   Collection Time: 01/05/19  8:46 AM  Result Value Ref Range   WBC 6.3 4.0 - 10.5  K/uL   RBC 4.75 4.22 - 5.81 MIL/uL   Hemoglobin 13.9 13.0 - 17.0 g/dL   HCT 43.0 39.0 - 52.0 %   MCV 90.5 80.0 - 100.0 fL   MCH 29.3 26.0 - 34.0 pg   MCHC 32.3 30.0 - 36.0 g/dL   RDW 14.2 11.5 - 15.5 %   Platelets 234 150 - 400 K/uL   nRBC 0.0 0.0 - 0.2 %    Comment: Performed at [facility], [place] [address], [place], [place] [address]  Troponin I (High Sensitivity)     Status: None   Collection Time: 01/05/19  8:46 AM  Result Value Ref Range   Troponin I (High Sensitivity) 17 <18 ng/L    Comment: (NOTE) Elevated high sensitivity troponin I (hsTnI) values and significant  changes across serial measurements may suggest ACS but many other  chronic and acute conditions are known to elevate hsTnI results.  Refer to the "Links" section for chest pain algorithms and additional  guidance. Performed at [facility], [place] [address] [place], [place] [address]   Troponin I (High Sensitivity)     Status: Abnormal   Collection Time: 01/05/19 12:05 PM  Result Value Ref Range   Troponin I (High Sensitivity) 19 (H) <18 ng/L    Comment: (NOTE) Elevated high sensitivity troponin I (hsTnI) values and significant  changes across serial measurements may suggest ACS but many other  chronic and acute conditions are known to elevate hsTnI results.  Refer to the "Links" section for chest pain algorithms and additional  guidance. Performed at [facility], [place] [address], [place], [place] [address]   Hepatic function panel     Status: Abnormal   Collection Time: 01/05/19 12:05 PM  Result Value Ref Range   Total Protein 7.2 6.5 - 8.1 g/dL   Albumin 3.4 (L) 3.5 - 5.0 g/dL   AST 27 15 - 41 U/L   ALT 77 (H) 0 - 44 U/L   Alkaline Phosphatase 78 38 - 126 U/L   Total Bilirubin 1.3 (H) 0.3 - 1.2 mg/dL   Bilirubin, Direct 0.2 0.0 - 0.2 mg/dL   Indirect Bilirubin 1.1 (H) 0.3 - 0.9 mg/dL    Comment: Performed at [facility] [address], [place], [place] [address]  Lipase,  blood     Status: None   Collection Time: 01/05/19 12:05 PM  Result Value Ref Range   Lipase 29 11 - 51 U/L    Comment: Performed at [facility] [address], [place], [place] [address]  SARS CORONAVIRUS 2 (TAT 6-24 HRS) Nasopharyngeal Nasopharyngeal Swab     Status: None   Collection Time: 01/05/19  1:06 PM   Specimen: Nasopharyngeal Swab  Result Value Ref Range   SARS Coronavirus 2 NEGATIVE NEGATIVE    Comment: (NOTE) SARS-CoV-2 target nucleic acids are NOT DETECTED. The SARS-CoV-2 RNA is generally detectable in upper and lower respiratory specimens during the acute phase of infection. Negative results do not preclude SARS-CoV-2 infection, do not rule out co-infections with other pathogens, and should not be used as the sole basis for treatment or other patient management decisions. Negative results must be combined with clinical observations, patient history, and epidemiological information. The expected result is Negative. Fact Sheet for Patients: SugarRoll.be Fact Sheet for Healthcare Providers: https://www.woods-mathews.com/ This test is not yet approved or cleared by the [place] FDA and  has been authorized for detection and/or diagnosis of SARS-CoV-2 by FDA under an Emergency Use Authorization (EUA). This EUA will remain  in effect (meaning this test can be used) for the duration of the COVID-19 declaration under Section 56 4(b)(1) of the Act, 21 U.S.C. section 360bbb-3(b)(1), unless the authorization is terminated or revoked sooner. Performed at [facility], [place] [address] [place], [place] [address]   Urinalysis, Routine w reflex microscopic     Status: Abnormal   Collection Time: 01/05/19  1:08 PM  Result  Value Ref Range   Color, Urine YELLOW YELLOW   APPearance CLEAR CLEAR   Specific Gravity, Urine 1.019 1.005 - 1.030   pH 5.0 5.0 - 8.0   Glucose, UA 150 (A) NEGATIVE mg/dL   Hgb urine dipstick SMALL  (A) NEGATIVE   Bilirubin Urine NEGATIVE NEGATIVE   Ketones, ur NEGATIVE NEGATIVE mg/dL   Protein, ur 30 (A) NEGATIVE mg/dL   Nitrite NEGATIVE NEGATIVE   Leukocytes,Ua NEGATIVE NEGATIVE   RBC / HPF 0-5 0 - 5 RBC/hpf   WBC, UA 0-5 0 - 5 WBC/hpf   Bacteria, UA NONE SEEN NONE SEEN   Mucus PRESENT     Comment: Performed at [facility] [address] [place], [place] [address]   DG Chest 2 View  Result Date: 01/05/2019 CLINICAL DATA:  C/o intermittent L sided chest pain x 2 weeks with SOB and dizziness. Reports vomiting last week. Hx CABG x5 2016 EXAM: CHEST - 2 VIEW COMPARISON:  Chest radiograph 01/09/2018 FINDINGS: Stable cardiomediastinal contours status post median sternotomy and CABG. The lungs are clear. No pneumothorax or pleural effusion. No acute finding in the visualized skeleton. IMPRESSION: No acute cardiopulmonary finding. Electronically Signed   By: Audie Pinto M.D.   On: 01/05/2019 09:09   CT Head Wo Contrast  Result Date: 01/05/2019 CLINICAL DATA:  Syncope EXAM: CT HEAD WITHOUT CONTRAST TECHNIQUE: Contiguous axial images were obtained from the base of the skull through the vertex without intravenous contrast. COMPARISON:  None. FINDINGS: Brain: Age related atrophy and minor white matter microvascular ischemic changes about the lateral ventricles. Remote lacunar type infarct in the left basal ganglia. No acute intracranial hemorrhage, mass lesion, new infarction, midline shift, herniation, hydrocephalus, or extra-axial fluid collection. No focal mass effect or edema. Gray-white matter differentiation maintained. Cisterns remain patent. Cerebellar atrophy as well. Vascular: Intracranial atherosclerosis.  No hyperdense vessel. Skull: Normal. Negative for fracture or focal lesion. Sinuses/Orbits: No acute finding. Other: None. IMPRESSION: Atrophy and chronic white matter microvascular ischemic changes. No acute intracranial abnormality by noncontrast CT. Intracranial  atherosclerosis. Electronically Signed   By: Jerilynn Mages.  Shick M.D.   On: 01/05/2019 14:02    Pending Labs Unresulted Labs (From admission, onward)    Start     Ordered   01/06/19 5361  Basic metabolic panel  Tomorrow morning,   R     01/05/19 1514   01/06/19 0500  Lipid panel  Tomorrow morning,   R     01/05/19 1514   01/06/19 0500  Hemoglobin A1c  Tomorrow morning,   R     01/05/19 1514   01/06/19 0500  Heparin level (unfractionated)  Daily,   R     01/05/19 1501   01/06/19 0230  APTT  Once-Timed,   STAT     01/05/19 1501          Vitals/Pain Today's Vitals   01/05/19 1400 01/05/19 1405 01/05/19 1415 01/05/19 1434  BP: (!) 147/88  (!) 142/80   Pulse: 71  70   Resp: (!) 22  17   Temp:      TempSrc:      SpO2: 99%  100%   Weight:    99.1 kg  Height:    5' 8"  (1.727 m)  PainSc:  0-No pain  0-No pain    Isolation Precautions No active isolations  Medications Medications  aspirin EC tablet 81 mg (has no administration in time range)  carvedilol (COREG) tablet 6.25 mg (has no administration in time  range)  isosorbide mononitrate (IMDUR) 24 hr tablet 30 mg (has no administration in time range)  lisinopril (ZESTRIL) tablet 5 mg (has no administration in time range)  nitroGLYCERIN (NITROSTAT) SL tablet 0.4 mg (has no administration in time range)  rosuvastatin (CRESTOR) tablet 10 mg (has no administration in time range)  insulin NPH Human (NOVOLIN N) injection 10 Units (has no administration in time range)  acetaminophen (TYLENOL) tablet 650 mg (has no administration in time range)  ondansetron (ZOFRAN) injection 4 mg (has no administration in time range)  sodium chloride flush (NS) 0.9 % injection 3 mL (has no administration in time range)  sodium chloride flush (NS) 0.9 % injection 3 mL (has no administration in time range)  0.9 %  sodium chloride infusion (has no administration in time range)  0.9% sodium chloride infusion (has no administration in time range)    Followed by   0.9% sodium chloride infusion (has no administration in time range)  heparin ADULT infusion 100 units/mL (25000 units/220m sodium chloride 0.45%) (has no administration in time range)  sodium chloride flush (NS) 0.9 % injection 3 mL (3 mLs Intravenous Given 01/05/19 1435)    Mobility walks with person assist Low fall risk   Focused Assessments Cardiac Assessment Handoff:    Lab Results  Component Value Date   CKTOTAL 211 06/09/2013   TROPONINI 0.57 ([place] 08/03/2015   No results found for: DDIMER Does the Patient currently have chest pain? No     R Recommendations: See Admitting Provider Note  Report given to:   Additional Notes:

## 2019-01-05 NOTE — Progress Notes (Signed)
ANTICOAGULATION CONSULT NOTE - Initial Consult  Pharmacy Consult for Heparin Indication: chest pain/ACS  Allergies  Allergen Reactions  . Lipitor [Atorvastatin] Other (See Comments)    Myopathy Spring 2006  . Pravastatin Other (See Comments)    LEG CRAMPS  . Simvastatin Other (See Comments)    LEG CRAMPS    Patient Measurements: Height: 5' 8"  (172.7 cm) Weight: 218 lb 8 oz (99.1 kg) IBW/kg (Calculated) : 68.4 Heparin Dosing Weight: 89.5  Vital Signs: Temp: 98.3 F (36.8 C) (01/03 1132) Temp Source: Oral (01/03 1132) BP: 142/80 (01/03 1415) Pulse Rate: 70 (01/03 1415)  Labs: Recent Labs    01/05/19 0846 01/05/19 1205  HGB 13.9  --   HCT 43.0  --   PLT 234  --   CREATININE 1.14  --   TROPONINIHS 17 19*    Estimated Creatinine Clearance: 65.9 mL/min (by C-G formula based on SCr of 1.14 mg/dL).   Medical History: Past Medical History:  Diagnosis Date  . Arthritis    "fingers, shoulders" (08/03/2015)  . Atrial fibrillation (HCC)    Paroxysmal, rare episodes, sinus rhythm on flecainide, patient prefers not to take Coumadin  . Bradycardia    April, 2013  . Chest pain    Nuclear, 2006, no ischemia  . Chronic lower back pain    "all my life"  . Coronary artery disease    s/p staged cath 05/12/2014 and 5/11, DES x 2 to heavily calcified RCA, residual with 60% prox LAD, 70% D1  . Ejection fraction    EF 55-60%,  echo, January, 2011  . Elevated CPK    CPK elevated with normal MB and normal troponin the past  . Heart murmur   . History of echocardiogram    Echo 8/16: EF 55%, normal wall motion, grade 2 diastolic dysfunction, trivial MR, normal RV function, PASP 27 mmHg  . Hypertension   . IBS (irritable bowel syndrome)   . Myocardial infarction ([place]) 06/2014  . Sinus drainage   . Type II diabetes mellitus (HCC)     Medications:  Scheduled:    Assessment: Patient is a 85 yom that presented to the ED today after dizziness , syncope, and worsening dyspnea.  Patient is post CABG and pharmacy has been asked to start heparin at this time.  Patient is on Apixaban PTA and his last dose at at 0800 on 1/3.   Goal of Therapy:  Heparin level 0.3-0.7 units/ml aPTT 66-102 seconds Monitor platelets by anticoagulation protocol: Yes   Plan:  - Last dose of heparin being on 1/3 at 0800 - Will start heparin drip @ 1050 units/hr @ 2000  - Will monitor heparin using aPTT at this time until Heparin level and aPTT correlate.  - Monitor patient for s/s of bleeding and CBC daily   Duanne Limerick PharmD. BCPS  01/05/2019,3:01 PM

## 2019-01-05 NOTE — ED Notes (Signed)
Pt reports one quick sharp pain in the chest about 20 minutes ago; denies chest pain currently

## 2019-01-06 ENCOUNTER — Encounter (HOSPITAL_COMMUNITY)
Admission: EM | Disposition: A | Discharge status: Home / Self Care | Payer: Self-pay | Source: Home / Self Care | Attending: Cardiology

## 2019-01-06 ENCOUNTER — Inpatient Hospital Stay (HOSPITAL_COMMUNITY): Payer: Medicare Other

## 2019-01-06 DIAGNOSIS — I25110 Atherosclerotic heart disease of native coronary artery with unstable angina pectoris: Secondary | ICD-10-CM

## 2019-01-06 DIAGNOSIS — I255 Ischemic cardiomyopathy: Secondary | ICD-10-CM

## 2019-01-06 DIAGNOSIS — I340 Nonrheumatic mitral (valve) insufficiency: Secondary | ICD-10-CM

## 2019-01-06 DIAGNOSIS — I4891 Unspecified atrial fibrillation: Secondary | ICD-10-CM

## 2019-01-06 DIAGNOSIS — I25118 Atherosclerotic heart disease of native coronary artery with other forms of angina pectoris: Secondary | ICD-10-CM

## 2019-01-06 DIAGNOSIS — I361 Nonrheumatic tricuspid (valve) insufficiency: Secondary | ICD-10-CM

## 2019-01-06 DIAGNOSIS — I25710 Atherosclerosis of autologous vein coronary artery bypass graft(s) with unstable angina pectoris: Secondary | ICD-10-CM

## 2019-01-06 HISTORY — PX: LEFT HEART CATH AND CORS/GRAFTS ANGIOGRAPHY: CATH118250

## 2019-01-06 LAB — ECHOCARDIOGRAM COMPLETE
Height: 68 in
Weight: 3313.6 oz

## 2019-01-06 LAB — LIPID PANEL
Cholesterol: 117 mg/dL (ref 0–200)
HDL: 29 mg/dL — ABNORMAL LOW (ref >40)
LDL Cholesterol: 66 mg/dL (ref 0–99)
Total CHOL/HDL Ratio: 4 RATIO
Triglycerides: 112 mg/dL (ref <150)
VLDL: 22 mg/dL (ref 0–40)

## 2019-01-06 LAB — BASIC METABOLIC PANEL
Anion gap: 9 (ref 5–15)
BUN: 14 mg/dL (ref 8–23)
CO2: 24 mmol/L (ref 22–32)
Calcium: 8.8 mg/dL — ABNORMAL LOW (ref 8.9–10.3)
Chloride: 104 mmol/L (ref 98–111)
Creatinine, Ser: 1.19 mg/dL (ref 0.61–1.24)
GFR calc Af Amer: >60 mL/min (ref >60)
GFR calc non Af Amer: >60 mL/min (ref >60)
Glucose, Bld: 149 mg/dL — ABNORMAL HIGH (ref 70–99)
Potassium: 3.8 mmol/L (ref 3.5–5.1)
Sodium: 137 mmol/L (ref 135–145)

## 2019-01-06 LAB — APTT: aPTT: 33 seconds (ref 24–36)

## 2019-01-06 LAB — HEPARIN LEVEL (UNFRACTIONATED): Heparin Unfractionated: 0.82 IU/mL — ABNORMAL HIGH (ref 0.30–0.70)

## 2019-01-06 LAB — HEMOGLOBIN A1C
Hgb A1c MFr Bld: 9.2 % — ABNORMAL HIGH (ref 4.8–5.6)
Mean Plasma Glucose: 217.34 mg/dL

## 2019-01-06 LAB — TSH: TSH: 1.529 u[IU]/mL (ref 0.350–4.500)

## 2019-01-06 LAB — SURGICAL PCR SCREEN
MRSA, PCR: NEGATIVE
Staphylococcus aureus: NEGATIVE

## 2019-01-06 SURGERY — LEFT HEART CATH AND CORS/GRAFTS ANGIOGRAPHY
Anesthesia: LOCAL

## 2019-01-06 MED ORDER — MIDAZOLAM HCL 2 MG/2ML IJ SOLN
INTRAMUSCULAR | Status: DC | PRN
  Administered 2019-01-06: 1 mg via INTRAVENOUS

## 2019-01-06 MED ORDER — SODIUM CHLORIDE 0.9% FLUSH: 3.0000 mL | Freq: Two times a day (BID) | INTRAVENOUS | Status: DC

## 2019-01-06 MED ORDER — LIDOCAINE HCL (PF) 1 % IJ SOLN
INTRAMUSCULAR | Status: AC
  Filled 2019-01-06: qty 30

## 2019-01-06 MED ORDER — CEFAZOLIN SODIUM-DEXTROSE 2-4 GM/100ML-% IV SOLN
2.0000 g | INTRAVENOUS | Status: AC
  Administered 2019-01-07: 2 g via INTRAVENOUS

## 2019-01-06 MED ORDER — SODIUM CHLORIDE 0.9 % IV SOLN
80.0000 mg | INTRAVENOUS | Status: AC
  Filled 2019-01-06: qty 2

## 2019-01-06 MED ORDER — MIDAZOLAM HCL 2 MG/2ML IJ SOLN
INTRAMUSCULAR | Status: AC
  Filled 2019-01-06: qty 2

## 2019-01-06 MED ORDER — VERAPAMIL HCL 2.5 MG/ML IV SOLN
INTRAVENOUS | Status: AC
  Filled 2019-01-06: qty 2

## 2019-01-06 MED ORDER — VERAPAMIL HCL 2.5 MG/ML IV SOLN
INTRAVENOUS | Status: DC | PRN
  Administered 2019-01-06: 14:00:00 10 mL via INTRA_ARTERIAL

## 2019-01-06 MED ORDER — SODIUM CHLORIDE 0.9 % IV SOLN: 250.0000 mL | INTRAVENOUS | Status: DC | PRN

## 2019-01-06 MED ORDER — SODIUM CHLORIDE 0.9 % WEIGHT BASED INFUSION: 1.0000 mL/kg/h | INTRAVENOUS | Status: DC

## 2019-01-06 MED ORDER — HEPARIN (PORCINE) IN NACL 1000-0.9 UT/500ML-% IV SOLN
INTRAVENOUS | Status: AC
  Filled 2019-01-06: qty 1000

## 2019-01-06 MED ORDER — HEPARIN SODIUM (PORCINE) 1000 UNIT/ML IJ SOLN
INTRAMUSCULAR | Status: DC | PRN
  Administered 2019-01-06: 5000 [IU] via INTRAVENOUS

## 2019-01-06 MED ORDER — SODIUM CHLORIDE 0.9% FLUSH: 3.0000 mL | INTRAVENOUS | Status: DC | PRN

## 2019-01-06 MED ORDER — FENTANYL CITRATE (PF) 100 MCG/2ML IJ SOLN
INTRAMUSCULAR | Status: AC
  Filled 2019-01-06: qty 2

## 2019-01-06 MED ORDER — LIDOCAINE HCL (PF) 1 % IJ SOLN
INTRAMUSCULAR | Status: DC | PRN
  Administered 2019-01-06: 2 mL via INTRADERMAL

## 2019-01-06 MED ORDER — IOHEXOL 350 MG/ML SOLN
INTRAVENOUS | Status: DC | PRN
  Administered 2019-01-06: 14:00:00 80 mL via INTRACARDIAC

## 2019-01-06 MED ORDER — SODIUM CHLORIDE 0.9 % WEIGHT BASED INFUSION
3.0000 mL/kg/h | INTRAVENOUS | Status: AC
  Administered 2019-01-06: 3 mL/kg/h via INTRAVENOUS

## 2019-01-06 MED ORDER — LABETALOL HCL 5 MG/ML IV SOLN: 10.0000 mg | INTRAVENOUS | Status: AC | PRN

## 2019-01-06 MED ORDER — SODIUM CHLORIDE 0.9 % IV SOLN: INTRAVENOUS | Status: DC

## 2019-01-06 MED ORDER — HEPARIN (PORCINE) IN NACL 1000-0.9 UT/500ML-% IV SOLN
INTRAVENOUS | Status: DC | PRN
  Administered 2019-01-06 (×2): 500 mL

## 2019-01-06 MED ORDER — HEPARIN SODIUM (PORCINE) 1000 UNIT/ML IJ SOLN
INTRAMUSCULAR | Status: AC
  Filled 2019-01-06: qty 1

## 2019-01-06 MED ORDER — FENTANYL CITRATE (PF) 100 MCG/2ML IJ SOLN
INTRAMUSCULAR | Status: DC | PRN
  Administered 2019-01-06: 25 ug via INTRAVENOUS

## 2019-01-06 MED ORDER — HYDRALAZINE HCL 20 MG/ML IJ SOLN: 10.0000 mg | INTRAMUSCULAR | Status: AC | PRN

## 2019-01-06 MED ORDER — ASPIRIN 81 MG PO CHEW
CHEWABLE_TABLET | ORAL | Status: AC
  Administered 2019-01-06: 81 mg
  Filled 2019-01-06: qty 1

## 2019-01-06 SURGICAL SUPPLY — 10 items
CATH DXT MULTI JL4 JR4 ANG PIG (CATHETERS) ×1 IMPLANT
CATH INFINITI 5 FR IM (CATHETERS) ×1 IMPLANT
DEVICE RAD COMP TR BAND LRG (VASCULAR PRODUCTS) ×1 IMPLANT
GLIDESHEATH SLEND SS 6F .021 (SHEATH) ×1 IMPLANT
GUIDEWIRE INQWIRE 1.5J.035X260 (WIRE) IMPLANT
INQWIRE 1.5J .035X260CM (WIRE) ×2
KIT HEART LEFT (KITS) ×2 IMPLANT
PACK CARDIAC CATHETERIZATION (CUSTOM PROCEDURE TRAY) ×2 IMPLANT
TRANSDUCER W/STOPCOCK (MISCELLANEOUS) ×2 IMPLANT
TUBING CIL FLEX 10 FLL-RA (TUBING) ×2 IMPLANT

## 2019-01-06 NOTE — H&P (Signed)
Please see the consult note from 01/05/2019.  [name] 01/06/2019

## 2019-01-06 NOTE — Progress Notes (Signed)
[facility] for Heparin Indication: chest pain/ACS  Allergies  Allergen Reactions  . Lipitor [Atorvastatin] Other (See Comments)    Myopathy Spring 2006  . Pravastatin Other (See Comments)    LEG CRAMPS  . Simvastatin Other (See Comments)    LEG CRAMPS    Patient Measurements: Height: 5' 8"  (172.7 cm) Weight: 218 lb 8 oz (99.1 kg) IBW/kg (Calculated) : 68.4 Heparin Dosing Weight: 89.5  Vital Signs: Temp: 100 F (37.8 C) (01/03 2106) Temp Source: Oral (01/03 2106) BP: 148/76 (01/03 2106) Pulse Rate: 76 (01/03 2106)  Labs: Recent Labs    01/05/19 0846 01/05/19 1205 01/06/19 0151  HGB 13.9  --   --   HCT 43.0  --   --   PLT 234  --   --   APTT  --   --  33  HEPARINUNFRC  --   --  0.82*  CREATININE 1.14  --  1.19  TROPONINIHS 17 19*  --     Estimated Creatinine Clearance: 63.1 mL/min (by C-G formula based on SCr of 1.19 mg/dL).  Assessment: 74 y.o. male with h/o Afib, Eliquis on hold, for heparin  Goal of Therapy:  Heparin level 0.3-0.7 units/ml aPTT 66-102 seconds Monitor platelets by anticoagulation protocol: Yes   Plan:  Increase Heparin 1300 units/hr Follow up after cath today   Umberto Pavek, Bronson Curb PharmD. BCPS  01/06/2019,3:08 AM

## 2019-01-06 NOTE — Plan of Care (Signed)
  Problem: Health Behavior/Discharge Planning: Goal: Ability to manage health-related needs will improve Outcome: Progressing   Problem: Clinical Measurements: Goal: Ability to maintain clinical measurements within normal limits will improve Outcome: Progressing Goal: Will remain free from infection Outcome: Progressing   Problem: Safety: Goal: Ability to remain free from injury will improve Outcome: Progressing   Problem: Education: Goal: Understanding of cardiac disease, CV risk reduction, and recovery process will improve Outcome: Progressing

## 2019-01-06 NOTE — Consult Note (Addendum)
ELECTROPHYSIOLOGY CONSULT NOTE    Patient ID: [name] MRN: [id number], DOB/AGE: May 26, 1945 74 y.o.  Admit date: 01/05/2019 Date of Consult: 01/06/2019  Primary Physician: [name], NP Primary Cardiologist: Larae Grooms, MD  Electrophysiologist: Dr. Rayann Heman   Referring Provider: Dr. Meda Coffee  Patient Profile: SIE FORMISANO is a 74 y.o. male with a history of paroxysmal A fib s/p MAZE and LAA clip, on longterm Eliquis, CAD s/p CABG 06/2014, DM2, HTN who is being seen today for the evaluation of syncope at the request of Dr. Meda Coffee.  HPI:  AVEION NGUYEN is a 74 y.o. male with medical history as above. He has been feeling dizziness every 2-3 days for the "past several weeks". This past Friday, he leaned over to get his cat out from under the bed, walked back out of his room, then he suddenly passed out per family who witnessed this. He is unsure how long he was out. They estimate he was out 30-60 seconds, and very diaphoretic. He did have the diaphoresis along with his dizzy spells, and these mostly occurred with activity. Denies palpitations. He did not feel anything specific prior to passing out. He denies any dizzy spells at rest, states they were all with activity.   No chest pain, minimal DOE, no PND or orthopnea  No palpitations, and no AFib of which he is aware since his MAZE surgery  Thromboembolic risk factors ( age -34 , HTN-1, DM-1, Vasc disease -1, CHF-1, ) for a CHADSVASc Score of >=4    Past Medical History:  Diagnosis Date  . Arthritis    "fingers, shoulders" (08/03/2015)  . Atrial fibrillation (HCC)    Paroxysmal, rare episodes, sinus rhythm on flecainide, patient prefers not to take Coumadin  . Bradycardia    April, 2013  . Chest pain    Nuclear, 2006, no ischemia  . Chronic lower back pain    "all my life"  . Coronary artery disease    s/p staged cath 05/12/2014 and 5/11, DES x 2 to heavily calcified RCA, residual with 60% prox LAD, 70% D1  .  Ejection fraction    EF 55-60%,  echo, January, 2011  . Elevated CPK    CPK elevated with normal MB and normal troponin the past  . Heart murmur   . History of echocardiogram    Echo 8/16: EF 55%, normal wall motion, grade 2 diastolic dysfunction, trivial MR, normal RV function, PASP 27 mmHg  . Hypertension   . IBS (irritable bowel syndrome)   . Myocardial infarction ([place]) 06/2014  . Sinus drainage   . Type II diabetes mellitus ([place])      Surgical History:  Past Surgical History:  Procedure Laterality Date  . APPENDECTOMY    . CARDIAC CATHETERIZATION N/A 05/12/2014   Procedure: Left Heart Cath and Coronary Angiography;  Surgeon: Troy Sine, MD;  Location: [facility];  Service: Cardiovascular;  Laterality: N/A;  . CARDIAC CATHETERIZATION N/A 05/13/2014   Procedure: Coronary/Graft Atherectomy;  Surgeon: Troy Sine, MD;  Location: [facility];  Service: Cardiovascular;  Laterality: N/A;  . CARDIAC CATHETERIZATION Right 05/13/2014   Procedure: Temporary Pacemaker;  Surgeon: Troy Sine, MD;  Location: [facility];  Service: Cardiovascular;  Laterality: Right;  . CARDIAC CATHETERIZATION N/A 05/13/2014   Procedure: Coronary Stent Intervention;  Surgeon: Troy Sine, MD;  Location: [facility];  Service: Cardiovascular;  Laterality: N/A;  . CARDIAC CATHETERIZATION N/A 06/18/2014  Procedure: Left Heart Cath and Coronary Angiography;  Surgeon: Peter M Martinique, MD;  Location: [facility];  Service: Cardiovascular;  Laterality: N/A;  . CARDIAC CATHETERIZATION N/A 08/03/2015   Procedure: Left Heart Cath and Cors/Grafts Angiography;  Surgeon: Nelva Bush, MD;  Location: [facility];  Service: Cardiovascular;  Laterality: N/A;  . CARDIOVASCULAR STRESS TEST  05/06/2014  . CATARACT EXTRACTION W/ INTRAOCULAR LENS IMPLANT Left   . CORONARY ARTERY BYPASS GRAFT N/A 06/22/2014   Procedure: CORONARY ARTERY BYPASS GRAFTING (CABG) x five, using left  internal mammary artery, and right leg greater saphenous vein harvested endoscopically;  Surgeon: Melrose Nakayama, MD;  Location: [facility];  Service: Open Heart Surgery;  Laterality: N/A;  . LAPAROSCOPIC CHOLECYSTECTOMY    . MAZE N/A 06/22/2014   Procedure: MAZE;  Surgeon: Melrose Nakayama, MD;  Location: [facility];  Service: Open Heart Surgery;  Laterality: N/A;  . TEE WITHOUT CARDIOVERSION N/A 06/22/2014   Procedure: TRANSESOPHAGEAL ECHOCARDIOGRAM (TEE);  Surgeon: Melrose Nakayama, MD;  Location: [facility];  Service: Open Heart Surgery;  Laterality: N/A;     Medications Prior to Admission  Medication Sig Dispense Refill Last Dose  . aspirin 81 MG tablet Take 81 mg by mouth daily.   01/05/2019 at Unknown time  . carvedilol (COREG) 6.25 MG tablet TAKE 1 TABLET BY MOUTH TWICE A DAY WITH FOOD (Patient taking differently: Take 6.25 mg by mouth 2 (two) times daily with a meal. ) 180 tablet 3 01/05/2019 at Unknown time  . ELIQUIS 5 MG TABS tablet TAKE 1 TABLET BY MOUTH TWICE A DAY (Patient taking differently: Take 5 mg by mouth 2 (two) times daily. ) 60 tablet 5 01/05/2019 at Unknown time  . insulin NPH Human (NOVOLIN N RELION) 100 UNIT/ML injection Inject 50 units into the skin every morning and 50 units into the skin in the evening. (Patient taking differently: Inject 10 Units into the skin daily before breakfast. ) 30 mL 5 01/05/2019 at Unknown time  . Insulin Pen Needle 31G X 8 MM MISC Use as directed with Lantus. (Patient taking differently: 1 each by Other route See admin instructions. Use as directed with Insulin administration.) 100 each 5 01/05/2019 at Unknown time  . insulin regular (NOVOLIN R) 100 units/mL injection Inject 40 Units into the skin 2 (two) times daily before a meal.   01/05/2019 at Unknown time  . isosorbide mononitrate (IMDUR) 30 MG 24 hr tablet Take 1 tablet (30 mg total) by mouth daily. 90 tablet 3 01/05/2019 at Unknown time  . lisinopril (ZESTRIL) 5 MG tablet TAKE 1 TABLET (5 MG TOTAL)  DAILY BY MOUTH. 90 tablet 3 01/05/2019 at Unknown time  . rosuvastatin (CRESTOR) 10 MG tablet Take 1 tablet (10 mg total) by mouth daily. 90 tablet 3 01/05/2019 at Unknown time  . albuterol (VENTOLIN HFA) 108 (90 Base) MCG/ACT inhaler Inhale 2 puffs into the lungs every 4 (four) hours as needed for wheezing or shortness of breath (cough, shortness of breath or wheezing.). 18 g 0 unknown  . fexofenadine (ALLEGRA) 180 MG tablet TAKE 1 TABLET (180 MG TOTAL) BY MOUTH DAILY. FOR ALLERGIES. (Patient not taking: Reported on 01/05/2019) 90 tablet 0 Not Taking at Unknown time  . fluorometholone (FML) 0.1 % ophthalmic suspension Place 1 drop into the right eye daily as needed (for eye redness and itching).    Not Taking at Unknown time  . fluticasone (FLONASE) 50 MCG/ACT nasal spray PLACE 1 SPRAY INTO BOTH NOSTRILS 2 (  TWO) TIMES DAILY AS NEEDED FOR ALLERGIES OR RHINITIS. (Patient not taking: Reported on 01/05/2019) 16 mL 2 Not Taking at Unknown time  . MAGNESIUM PO Take 1 tablet by mouth daily as needed (for leg cramping).    unknown  . nitroGLYCERIN (NITROSTAT) 0.4 MG SL tablet Place 1 tablet (0.4 mg total) under the tongue every 5 (five) minutes as needed for chest pain. 25 tablet 0 unknown  . tamsulosin (FLOMAX) 0.4 MG CAPS capsule TAKE 1 CAPSULE (0.4 MG TOTAL) BY MOUTH AS NEEDED. FOR DIFFICULTY URINATING. 90 capsule 1 unknown    Inpatient Medications:  . aspirin EC  81 mg Oral Daily  . carvedilol  6.25 mg Oral BID WC  . insulin NPH Human  10 Units Subcutaneous QAC breakfast  . isosorbide mononitrate  30 mg Oral Daily  . lisinopril  5 mg Oral Daily  . rosuvastatin  10 mg Oral q1800  . sodium chloride flush  3 mL Intravenous Q12H  . sodium chloride flush  3 mL Intravenous Q12H    Allergies:  Allergies  Allergen Reactions  . Lipitor [Atorvastatin] Other (See Comments)    Myopathy Spring 2006  . Pravastatin Other (See Comments)    LEG CRAMPS  . Simvastatin Other (See Comments)    LEG CRAMPS    Social  History   Socioeconomic History  . Marital status: Married    Spouse name: Not on file  . Number of children: Not on file  . Years of education: Not on file  . Highest education level: Not on file  Occupational History  . Not on file  Tobacco Use  . Smoking status: Never Smoker  . Smokeless tobacco: Never Used  Substance and Sexual Activity  . Alcohol use: No  . Drug use: No  . Sexual activity: Not on file  Other Topics Concern  . Not on file  Social History Narrative  . Not on file   Social Determinants of Health   Financial Resource Strain: Low Risk   . Difficulty of Paying Living Expenses: Not hard at all  Food Insecurity: No Food Insecurity  . Worried About Charity fundraiser in the Last Year: Never true  . Ran Out of Food in the Last Year: Never true  Transportation Needs: No Transportation Needs  . Lack of Transportation (Medical): No  . Lack of Transportation (Non-Medical): No  Physical Activity: Unknown  . Days of Exercise per Week: 3 days  . Minutes of Exercise per Session: Not on file  Stress: No Stress Concern Present  . Feeling of Stress : Not at all  Social Connections:   . Frequency of Communication with Friends and Family: Not on file  . Frequency of Social Gatherings with Friends and Family: Not on file  . Attends Religious Services: Not on file  . Active Member of Clubs or Organizations: Not on file  . Attends Archivist Meetings: Not on file  . Marital Status: Not on file  Intimate Partner Violence: Not At Risk  . Fear of Current or Ex-Partner: No  . Emotionally Abused: No  . Physically Abused: No  . Sexually Abused: No     Family History  Problem Relation Age of Onset  . Alzheimer's disease Mother   . Emphysema Father   . Cancer Brother   . Arrhythmia Sister   . Heart attack Sister   . Heart disease Sister   . Hyperlipidemia Sister   . Hypertension Sister      Review  of Systems: All other systems reviewed and are otherwise  negative except as noted above.  Physical Exam: Vitals:   01/06/19 1355 01/06/19 1400 01/06/19 1405 01/06/19 1424  BP: 124/71 131/73 134/76 128/70  Pulse: 72 68 67   Resp: (!) 22 19 13 17   Temp:      TempSrc:      SpO2: 100% 97% 99% 98%  Weight:      Height:        GEN- The patient is well appearing, alert and oriented x 3 today.   HEENT: normocephalic, atraumatic; sclera clear, conjunctiva pink; hearing intact; oropharynx clear; neck supple Lungs- Clear to ausculation bilaterally, normal work of breathing.  No wheezes, rales, rhonchi Heart- Regular rate and rhythm, no murmurs, rubs or gallops GI- soft, non-tender, non-distended, bowel sounds present Extremities- no clubbing, cyanosis, or edema; DP/PT/radial pulses 2+ bilaterally. Left TR band in place. MS- no significant deformity or atrophy Skin- warm and dry, no rash or lesion Psych- euthymic mood, full affect Neuro- strength and sensation are intact  Labs:   Lab Results  Component Value Date   WBC 6.3 01/05/2019   HGB 13.9 01/05/2019   HCT 43.0 01/05/2019   MCV 90.5 01/05/2019   PLT 234 01/05/2019    Recent Labs  Lab 01/05/19 1205 01/06/19 0151  NA  --  137  K  --  3.8  CL  --  104  CO2  --  24  BUN  --  14  CREATININE  --  1.19  CALCIUM  --  8.8*  PROT 7.2  --   BILITOT 1.3*  --   ALKPHOS 78  --   ALT 77*  --   AST 27  --   GLUCOSE  --  149*      Radiology/Studies: DG Chest 2 View  Result Date: 01/05/2019 CLINICAL DATA:  C/o intermittent L sided chest pain x 2 weeks with SOB and dizziness. Reports vomiting last week. Hx CABG x5 2016 EXAM: CHEST - 2 VIEW COMPARISON:  Chest radiograph 01/09/2018 FINDINGS: Stable cardiomediastinal contours status post median sternotomy and CABG. The lungs are clear. No pneumothorax or pleural effusion. No acute finding in the visualized skeleton. IMPRESSION: No acute cardiopulmonary finding. Electronically Signed   By: Audie Pinto M.D.   On: 01/05/2019 09:09   CT  Head Wo Contrast  Result Date: 01/05/2019 CLINICAL DATA:  Syncope EXAM: CT HEAD WITHOUT CONTRAST TECHNIQUE: Contiguous axial images were obtained from the base of the skull through the vertex without intravenous contrast. COMPARISON:  None. FINDINGS: Brain: Age related atrophy and minor white matter microvascular ischemic changes about the lateral ventricles. Remote lacunar type infarct in the left basal ganglia. No acute intracranial hemorrhage, mass lesion, new infarction, midline shift, herniation, hydrocephalus, or extra-axial fluid collection. No focal mass effect or edema. Gray-white matter differentiation maintained. Cisterns remain patent. Cerebellar atrophy as well. Vascular: Intracranial atherosclerosis.  No hyperdense vessel. Skull: Normal. Negative for fracture or focal lesion. Sinuses/Orbits: No acute finding. Other: None. IMPRESSION: Atrophy and chronic white matter microvascular ischemic changes. No acute intracranial abnormality by noncontrast CT. Intracranial atherosclerosis. Electronically Signed   By: Jerilynn Mages.  Shick M.D.   On: 01/05/2019 14:02   ECHOCARDIOGRAM COMPLETE  Result Date: 01/06/2019   ECHOCARDIOGRAM REPORT   Patient Name:   BURNHAM TROST Neubert Date of Exam: 01/06/2019 Medical Rec #:  [id number]      Height:       68.0 in Accession #:    [id number]  Weight:       207.1 lb Date of Birth:  1945-12-09       BSA:          2.07 m Patient Age:    89 years       BP:           127/88 mmHg Patient Gender: M              HR:           69 bpm. Exam Location:  Inpatient Procedure: 2D Echo, Cardiac Doppler and Color Doppler Indications:    R07.9* Chest pain, unspecified  History:        Patient has prior history of Echocardiogram examinations, most                 recent 01/10/2016. Cardiomyopathy, CAD and Previous Myocardial                 Infarction, Abnormal ECG and Prior CABG, Pulmonary HTN,                 Signs/Symptoms:Chest Pain; Risk Factors:Diabetes and                 Hypertension.  Sonographer:     [place] Referring Phys: [id number] [place]  1. Left ventricular ejection fraction, by visual estimation, is 35 to 40%. The left ventricle has moderately decreased function. There is moderately increased left ventricular hypertrophy.  2. Left ventricular diastolic parameters are consistent with Grade III diastolic dysfunction (restrictive).  3. The left ventricle has no regional wall motion abnormalities.  4. Global right ventricle has moderately reduced systolic function.The right ventricular size is moderately enlarged. No increase in right ventricular wall thickness.  5. Left atrial size was normal.  6. Right atrial size was normal.  7. The mitral valve is normal in structure. Mild mitral valve regurgitation.  8. The tricuspid valve is grossly normal.  9. The aortic valve is normal in structure. Aortic valve regurgitation is not visualized. No evidence of aortic valve sclerosis or stenosis. 10. The pulmonic valve was grossly normal. Pulmonic valve regurgitation is not visualized. 11. Moderately elevated pulmonary artery systolic pressure. 12. The atrial septum is grossly normal. FINDINGS  Left Ventricle: Left ventricular ejection fraction, by visual estimation, is 35 to 40%. The left ventricle has moderately decreased function. The left ventricle has no regional wall motion abnormalities. There is moderately increased left ventricular hypertrophy. Concentric left ventricular hypertrophy. Left ventricular diastolic parameters are consistent with Grade III diastolic dysfunction (restrictive). Right Ventricle: The right ventricular size is moderately enlarged. No increase in right ventricular wall thickness. Global RV systolic function is has moderately reduced systolic function. The tricuspid regurgitant velocity is 3.35 m/s, and with an assumed right atrial pressure of 15 mmHg, the estimated right ventricular systolic pressure is moderately elevated at 60.0 mmHg. Left Atrium: Left atrial  size was normal in size. Right Atrium: Right atrial size was normal in size Pericardium: There is no evidence of pericardial effusion. Mitral Valve: The mitral valve is normal in structure. Mild mitral valve regurgitation. Tricuspid Valve: The tricuspid valve is grossly normal. Tricuspid valve regurgitation is mild. Aortic Valve: The aortic valve is normal in structure. Aortic valve regurgitation is not visualized. The aortic valve is structurally normal, with no evidence of sclerosis or stenosis. Pulmonic Valve: The pulmonic valve was grossly normal. Pulmonic valve regurgitation is not visualized. Pulmonic regurgitation is not visualized. Aorta: The aortic root and ascending  aorta are structurally normal, with no evidence of dilitation. IAS/Shunts: The atrial septum is grossly normal.  LEFT VENTRICLE PLAX 2D LVIDd:         5.29 cm LVIDs:         4.09 cm LV PW:         1.36 cm LV IVS:        1.40 cm LVOT diam:     2.00 cm LV SV:         61 ml LV SV Index:   28.33 LVOT Area:     3.14 cm  LV Volumes (MOD) LV area d, A2C:    35.80 cm LV area d, A4C:    29.70 cm LV area s, A2C:    28.90 cm LV area s, A4C:    20.70 cm LV major d, A2C:   8.52 cm LV major d, A4C:   7.98 cm LV major s, A2C:   7.85 cm LV major s, A4C:   7.07 cm LV vol d, MOD A2C: 127.0 ml LV vol d, MOD A4C: 91.9 ml LV vol s, MOD A2C: 91.2 ml LV vol s, MOD A4C: 52.7 ml LV SV MOD A2C:     35.8 ml LV SV MOD A4C:     91.9 ml LV SV MOD BP:      39.1 ml RIGHT VENTRICLE         IVC TAPSE (M-mode): 1.4 cm  IVC diam: 2.13 cm LEFT ATRIUM             Index       RIGHT ATRIUM           Index LA diam:        3.40 cm 1.64 cm/m  RA Area:     14.80 cm LA Vol (A2C):   49.5 ml 23.86 ml/m RA Volume:   32.60 ml  15.72 ml/m LA Vol (A4C):   39.2 ml 18.90 ml/m LA Biplane Vol: 44.3 ml 21.36 ml/m  AORTIC VALVE LVOT Vmax:   95.30 cm/s LVOT Vmean:  63.700 cm/s LVOT VTI:    0.201 m  AORTA Ao Root diam: 3.30 cm Ao Asc diam:  3.30 cm MITRAL VALVE                        TRICUSPID VALVE MV Area (PHT): 4.65 cm            TR Peak grad:   45.0 mmHg MV PHT:        47.27 msec          TR Vmax:        364.00 cm/s MV Decel Time: 163 msec MV E velocity: 99.43 cm/s 103 cm/s SHUNTS                                    Systemic VTI:  0.20 m                                    Systemic Diam: 2.00 cm  Mertie Moores MD Electronically signed by Mertie Moores MD Signature Date/Time: 01/06/2019/9:23:56 AM    Final     EKG: NSR 75 bpm, PR interval 174 ms, QRS 110 ms (personally reviewed)  TELEMETRY: NSR 60-70s, 5-10 PVCs per minute (Estimate at least 10-15%) (personally reviewed)  Assessment/Plan: 1.  Syncope In the setting of known ischemic disease, WMA, and frequent PVCs, suspicion for tachyarrhythmia as a cause is very high.   Dr. Caryl Comes had lengthy discussion about ICD in this setting with suspected VT. Will further discuss and consider ICD implantation.  2. Ischemic cardiomyopathy EF 35-40% this admission, down from 50-55% 01/2016. ? Contribution of PVCs noted on tele.   3. CAD s/p CABG Overall stable CAD and patent grafts per Dr. Saunders Revel.  Continue medical management.  4. H/o AF s/p MAZE and LAA clip Remains on Eliquis.  5. Uncontrolled IDDM A1c 9.2 on admission. Will need close PCP follow up.   6. Abnormal LFTs Mild elevation of ALT and T bili. Plan repeat tomorrow to follow.  For questions or updates, please contact [place] Please consult [email or web address] for contact info under Cardiology/STEMI.  Signed, Shirley Friar, PA-C  01/06/2019 2:46 PM  Syncope abrupt onset-offset  Ischemic cardiomyopathy, prior MI, CABG and EF 35%  Afib s/p MAZE and LAA clip on apixoban   Obesity  PVCs ( telemetry -- approx 5-10%)    Pt has abrupt onset syncope in the setting of prior MI and recurrent moderate-severe LV dysfunction EF about 35%.  The alacrity of his syncope strongly support an arrhythmic mechanism; QRSd is only minimally widened, making a brady  arrhythmia less likely.  The concern is VT  Two strategies present themselves; if his EF were still nearly normal, with his prior MI would recommend an EPS and if neg>> loop and if pos>>ICD However, his EF is now 35% or so, and that by itself identifies him as high risk  Have reviewed with JA-JV, both of whom are in agreement as to proceeding with an ICD for secondary prevention  He is advised not to drive for 6 months  He plays golf, and told him we would place the generator as medially as we could to keep it out of the way of his backswing  Orders written

## 2019-01-06 NOTE — Progress Notes (Signed)
  Echocardiogram 2D Echocardiogram has been performed.  [name] 01/06/2019, 8:31 AM

## 2019-01-06 NOTE — Plan of Care (Signed)
  Problem: Clinical Measurements: Goal: Diagnostic test results will improve Outcome: Progressing   Problem: Nutrition: Goal: Adequate nutrition will be maintained Outcome: Completed/Met   Problem: Pain Managment: Goal: General experience of comfort will improve Outcome: Completed/Met Pt denies CP or any other discomfort at this time.

## 2019-01-06 NOTE — Interval H&P Note (Signed)
History and Physical Interval Note:  01/06/2019 1:26 PM  [name]  has presented today for cardiac catheterization, with the diagnosis of unstable angina.  The various methods of treatment have been discussed with the patient and family. After consideration of risks, benefits and other options for treatment, the patient has consented to  Procedure(s): LEFT HEART CATH AND CORS/GRAFTS ANGIOGRAPHY (N/A) as a surgical intervention.  The patient's history has been reviewed, patient examined, no change in status, stable for surgery.  I have reviewed the patient's chart and labs.  Questions were answered to the patient's satisfaction.    Cath Lab Visit (complete for each Cath Lab visit)  Clinical Evaluation Leading to the Procedure:   ACS: Yes.    Non-ACS:  N/A  Alexa Blish

## 2019-01-06 NOTE — H&P (View-Only) (Signed)
Progress Note  Patient Name: [name] Date of Encounter: 01/06/2019  Primary Cardiologist: Larae Grooms, MD  Subjective   No symptoms since yesterday. Exertional dizziness was prior anginal equivalent.  Inpatient Medications    Scheduled Meds: . aspirin EC  81 mg Oral Daily  . carvedilol  6.25 mg Oral BID WC  . insulin NPH Human  10 Units Subcutaneous QAC breakfast  . isosorbide mononitrate  30 mg Oral Daily  . lisinopril  5 mg Oral Daily  . rosuvastatin  10 mg Oral q1800  . sodium chloride flush  3 mL Intravenous Q12H   Continuous Infusions: . sodium chloride    . sodium chloride 1 mL/kg/hr (01/06/19 0717)  . heparin 1,300 Units/hr (01/06/19 0432)   PRN Meds: sodium chloride, acetaminophen, nitroGLYCERIN, ondansetron (ZOFRAN) IV, sodium chloride flush   Vital Signs    Vitals:   01/05/19 2025 01/05/19 2106 01/06/19 0434 01/06/19 0823  BP: (!) 149/68 (!) 148/76 127/88 134/77  Pulse: 81 76 68   Resp: (!) 22 19 19    Temp: 98.2 F (36.8 C) 100 F (37.8 C) 98.9 F (37.2 C)   TempSrc: Oral Oral Oral   SpO2: 93% 98% 98%   Weight:   93.9 kg   Height:        Intake/Output Summary (Last 24 hours) at 01/06/2019 0919 Last data filed at 01/06/2019 0439 Gross per 24 hour  Intake 78.84 ml  Output --  Net 78.84 ml   Last 3 Weights 01/06/2019 01/05/2019 12/18/2018  Weight (lbs) 207 lb 1.6 oz 218 lb 8 oz 212 lb  Weight (kg) 93.94 kg 99.111 kg 96.163 kg     Telemetry    NSR, occasional PACs+PVCs, brief run of narrow complex tachycardia at 857a - Personally Reviewed  Physical Exam   GEN: No acute distress.  HEENT: Normocephalic, atraumatic, sclera non-icteric. Neck: No JVD or bruits. Cardiac: RRR no murmurs, rubs, or gallops.  Radials/DP/PT 1+ and equal bilaterally.  Respiratory: Clear to auscultation bilaterally. Breathing is unlabored. GI: Soft, nontender, non-distended, BS +x 4. MS: no deformity. Extremities: No clubbing or cyanosis. No edema. Distal pedal  pulses are 2+ and equal bilaterally. Neuro:  AAOx3. Follows commands. Psych:  Responds to questions appropriately with a normal affect. Examined by Dr. Meda Coffee - exam documentation is per our discussion Labs    High Sensitivity Troponin:   Recent Labs  Lab 01/05/19 0846 01/05/19 1205  TROPONINIHS 17 19*      Cardiac EnzymesNo results for input(s): TROPONINI in the last 168 hours. No results for input(s): TROPIPOC in the last 168 hours.   Chemistry Recent Labs  Lab 01/05/19 0846 01/05/19 1205 01/06/19 0151  NA 138  --  137  K 3.7  --  3.8  CL 103  --  104  CO2 24  --  24  GLUCOSE 142*  --  149*  BUN 12  --  14  CREATININE 1.14  --  1.19  CALCIUM 9.0  --  8.8*  PROT  --  7.2  --   ALBUMIN  --  3.4*  --   AST  --  27  --   ALT  --  77*  --   ALKPHOS  --  78  --   BILITOT  --  1.3*  --   GFRNONAA >60  --  >60  GFRAA >60  --  >60  ANIONGAP 11  --  9     Hematology Recent Labs  Lab 01/05/19  0846  WBC 6.3  RBC 4.75  HGB 13.9  HCT 43.0  MCV 90.5  MCH 29.3  MCHC 32.3  RDW 14.2  PLT 234    BNPNo results for input(s): BNP, PROBNP in the last 168 hours.   DDimer No results for input(s): DDIMER in the last 168 hours.   Radiology    DG Chest 2 View  Result Date: 01/05/2019 CLINICAL DATA:  C/o intermittent L sided chest pain x 2 weeks with SOB and dizziness. Reports vomiting last week. Hx CABG x5 2016 EXAM: CHEST - 2 VIEW COMPARISON:  Chest radiograph 01/09/2018 FINDINGS: Stable cardiomediastinal contours status post median sternotomy and CABG. The lungs are clear. No pneumothorax or pleural effusion. No acute finding in the visualized skeleton. IMPRESSION: No acute cardiopulmonary finding. Electronically Signed   By: Audie Pinto M.D.   On: 01/05/2019 09:09   CT Head Wo Contrast  Result Date: 01/05/2019 CLINICAL DATA:  Syncope EXAM: CT HEAD WITHOUT CONTRAST TECHNIQUE: Contiguous axial images were obtained from the base of the skull through the vertex without  intravenous contrast. COMPARISON:  None. FINDINGS: Brain: Age related atrophy and minor white matter microvascular ischemic changes about the lateral ventricles. Remote lacunar type infarct in the left basal ganglia. No acute intracranial hemorrhage, mass lesion, new infarction, midline shift, herniation, hydrocephalus, or extra-axial fluid collection. No focal mass effect or edema. Gray-white matter differentiation maintained. Cisterns remain patent. Cerebellar atrophy as well. Vascular: Intracranial atherosclerosis.  No hyperdense vessel. Skull: Normal. Negative for fracture or focal lesion. Sinuses/Orbits: No acute finding. Other: None. IMPRESSION: Atrophy and chronic white matter microvascular ischemic changes. No acute intracranial abnormality by noncontrast CT. Intracranial atherosclerosis. Electronically Signed   By: Jerilynn Mages.  Shick M.D.   On: 01/05/2019 14:02    Cardiac Studies   2d echo 01/06/19 IMPRESSIONS  1. Left ventricular ejection fraction, by visual estimation, is 35 to 40%. The left ventricle has moderately decreased function. There is moderately increased left ventricular hypertrophy.  2. Left ventricular diastolic parameters are consistent with Grade III diastolic dysfunction (restrictive).  3. The left ventricle has no regional wall motion abnormalities.  4. Global right ventricle has moderately reduced systolic function.The right ventricular size is moderately enlarged. No increase in right ventricular wall thickness.  5. Left atrial size was normal.  6. Right atrial size was normal.  7. The mitral valve is normal in structure. Mild mitral valve regurgitation.  8. The tricuspid valve is grossly normal.  9. The aortic valve is normal in structure. Aortic valve regurgitation is not visualized. No evidence of aortic valve sclerosis or stenosis. 10. The pulmonic valve was grossly normal. Pulmonic valve regurgitation is not visualized. 11. Moderately elevated pulmonary artery systolic  pressure. 12. The atrial septum is grossly normal.  Patient Profile     74 y.o. male with CAD s/p LAD PCI 2016 then CABG in 06/2014, ischemic cardiomyopathy, PAF, bradycardia, elevated CPK, HTN, uncontrolled DM2, IBS was admitted with exertional dizziness, fatigue, DOE. On Friday 1/1 had an episode of dizziness and syncope.   Assessment & Plan    1. Exertional dizziness/dyspnea - hsTroponin 17-19. Question possible unstable angina, plan cath today. Dr. Meda Coffee reviewed risks/benefits with patient. 2D echocardiogram demonstrates decline in LVEF from 50-55% in 2018 to 35-40%. He is on ASA, heparin, BB, statin (moderate dose given h/o myopathy and LDL at goal).   2. Syncope - etiology unclear. VSS, no hypotension or bradycardia. May need event monitor at discharge if cath unrevealing. Will need advisement of no  driving x 6 months with discharge information.  3. Ischemic cardiomyopathy - variable EF over the years. Await cath for further management.  4. H/o PAF - Telemetry shows NSR with PACs/PVCs and brief run of narrow complex SVT. It's possible this could be contributing to presentation. As above, consider event monitor. Will add TSH to labs. Eliquis on hold for cath; currently on heparin.  5. Abnormal LFTs - ALT and Tbili elevated, will repeat value in AM.  6. Uncontrolled IDDM - will need close OP PCP f/u. A1c 9.2. Insulin held this AM due to plan for NPO/cath.  For questions or updates, please contact [place] Please consult [email or web address] for contact info under Cardiology/STEMI.  Signed, Charlie Pitter, PA-C 01/06/2019, 9:19 AM    The patient was seen, examined and discussed with Melina Copa, PA-C and I agree with the above.   The patient has been asymptomatic since the admission, he is scheduled for cardiac cath today.  His telemetry shows a very short episode of SVT total of 12 beats, possibly short run of atrial fibrillation that was asymptomatic.  His creatinine is normal, if he is  He does not show any significant new findings we will plan for an outpatient event monitor as of work-up for recurrent presyncope and syncope.  Ena Dawley, MD 01/06/2019

## 2019-01-06 NOTE — Progress Notes (Addendum)
Progress Note  Patient Name: [name] Date of Encounter: 01/06/2019  Primary Cardiologist: Larae Grooms, MD  Subjective   No symptoms since yesterday. Exertional dizziness was prior anginal equivalent.  Inpatient Medications    Scheduled Meds: . aspirin EC  81 mg Oral Daily  . carvedilol  6.25 mg Oral BID WC  . insulin NPH Human  10 Units Subcutaneous QAC breakfast  . isosorbide mononitrate  30 mg Oral Daily  . lisinopril  5 mg Oral Daily  . rosuvastatin  10 mg Oral q1800  . sodium chloride flush  3 mL Intravenous Q12H   Continuous Infusions: . sodium chloride    . sodium chloride 1 mL/kg/hr (01/06/19 0717)  . heparin 1,300 Units/hr (01/06/19 0432)   PRN Meds: sodium chloride, acetaminophen, nitroGLYCERIN, ondansetron (ZOFRAN) IV, sodium chloride flush   Vital Signs    Vitals:   01/05/19 2025 01/05/19 2106 01/06/19 0434 01/06/19 0823  BP: (!) 149/68 (!) 148/76 127/88 134/77  Pulse: 81 76 68   Resp: (!) 22 19 19    Temp: 98.2 F (36.8 C) 100 F (37.8 C) 98.9 F (37.2 C)   TempSrc: Oral Oral Oral   SpO2: 93% 98% 98%   Weight:   93.9 kg   Height:        Intake/Output Summary (Last 24 hours) at 01/06/2019 0919 Last data filed at 01/06/2019 0439 Gross per 24 hour  Intake 78.84 ml  Output --  Net 78.84 ml   Last 3 Weights 01/06/2019 01/05/2019 12/18/2018  Weight (lbs) 207 lb 1.6 oz 218 lb 8 oz 212 lb  Weight (kg) 93.94 kg 99.111 kg 96.163 kg     Telemetry    NSR, occasional PACs+PVCs, brief run of narrow complex tachycardia at 857a - Personally Reviewed  Physical Exam   GEN: No acute distress.  HEENT: Normocephalic, atraumatic, sclera non-icteric. Neck: No JVD or bruits. Cardiac: RRR no murmurs, rubs, or gallops.  Radials/DP/PT 1+ and equal bilaterally.  Respiratory: Clear to auscultation bilaterally. Breathing is unlabored. GI: Soft, nontender, non-distended, BS +x 4. MS: no deformity. Extremities: No clubbing or cyanosis. No edema. Distal pedal  pulses are 2+ and equal bilaterally. Neuro:  AAOx3. Follows commands. Psych:  Responds to questions appropriately with a normal affect. Examined by Dr. Meda Coffee - exam documentation is per our discussion Labs    High Sensitivity Troponin:   Recent Labs  Lab 01/05/19 0846 01/05/19 1205  TROPONINIHS 17 19*      Cardiac EnzymesNo results for input(s): TROPONINI in the last 168 hours. No results for input(s): TROPIPOC in the last 168 hours.   Chemistry Recent Labs  Lab 01/05/19 0846 01/05/19 1205 01/06/19 0151  NA 138  --  137  K 3.7  --  3.8  CL 103  --  104  CO2 24  --  24  GLUCOSE 142*  --  149*  BUN 12  --  14  CREATININE 1.14  --  1.19  CALCIUM 9.0  --  8.8*  PROT  --  7.2  --   ALBUMIN  --  3.4*  --   AST  --  27  --   ALT  --  77*  --   ALKPHOS  --  78  --   BILITOT  --  1.3*  --   GFRNONAA >60  --  >60  GFRAA >60  --  >60  ANIONGAP 11  --  9     Hematology Recent Labs  Lab 01/05/19  0846  WBC 6.3  RBC 4.75  HGB 13.9  HCT 43.0  MCV 90.5  MCH 29.3  MCHC 32.3  RDW 14.2  PLT 234    BNPNo results for input(s): BNP, PROBNP in the last 168 hours.   DDimer No results for input(s): DDIMER in the last 168 hours.   Radiology    DG Chest 2 View  Result Date: 01/05/2019 CLINICAL DATA:  C/o intermittent L sided chest pain x 2 weeks with SOB and dizziness. Reports vomiting last week. Hx CABG x5 2016 EXAM: CHEST - 2 VIEW COMPARISON:  Chest radiograph 01/09/2018 FINDINGS: Stable cardiomediastinal contours status post median sternotomy and CABG. The lungs are clear. No pneumothorax or pleural effusion. No acute finding in the visualized skeleton. IMPRESSION: No acute cardiopulmonary finding. Electronically Signed   By: Audie Pinto M.D.   On: 01/05/2019 09:09   CT Head Wo Contrast  Result Date: 01/05/2019 CLINICAL DATA:  Syncope EXAM: CT HEAD WITHOUT CONTRAST TECHNIQUE: Contiguous axial images were obtained from the base of the skull through the vertex without  intravenous contrast. COMPARISON:  None. FINDINGS: Brain: Age related atrophy and minor white matter microvascular ischemic changes about the lateral ventricles. Remote lacunar type infarct in the left basal ganglia. No acute intracranial hemorrhage, mass lesion, new infarction, midline shift, herniation, hydrocephalus, or extra-axial fluid collection. No focal mass effect or edema. Gray-white matter differentiation maintained. Cisterns remain patent. Cerebellar atrophy as well. Vascular: Intracranial atherosclerosis.  No hyperdense vessel. Skull: Normal. Negative for fracture or focal lesion. Sinuses/Orbits: No acute finding. Other: None. IMPRESSION: Atrophy and chronic white matter microvascular ischemic changes. No acute intracranial abnormality by noncontrast CT. Intracranial atherosclerosis. Electronically Signed   By: Jerilynn Mages.  Shick M.D.   On: 01/05/2019 14:02    Cardiac Studies   2d echo 01/06/19 IMPRESSIONS  1. Left ventricular ejection fraction, by visual estimation, is 35 to 40%. The left ventricle has moderately decreased function. There is moderately increased left ventricular hypertrophy.  2. Left ventricular diastolic parameters are consistent with Grade III diastolic dysfunction (restrictive).  3. The left ventricle has no regional wall motion abnormalities.  4. Global right ventricle has moderately reduced systolic function.The right ventricular size is moderately enlarged. No increase in right ventricular wall thickness.  5. Left atrial size was normal.  6. Right atrial size was normal.  7. The mitral valve is normal in structure. Mild mitral valve regurgitation.  8. The tricuspid valve is grossly normal.  9. The aortic valve is normal in structure. Aortic valve regurgitation is not visualized. No evidence of aortic valve sclerosis or stenosis. 10. The pulmonic valve was grossly normal. Pulmonic valve regurgitation is not visualized. 11. Moderately elevated pulmonary artery systolic  pressure. 12. The atrial septum is grossly normal.  Patient Profile     74 y.o. male with CAD s/p LAD PCI 2016 then CABG in 06/2014, ischemic cardiomyopathy, PAF, bradycardia, elevated CPK, HTN, uncontrolled DM2, IBS was admitted with exertional dizziness, fatigue, DOE. On Friday 1/1 had an episode of dizziness and syncope.   Assessment & Plan    1. Exertional dizziness/dyspnea - hsTroponin 17-19. Question possible unstable angina, plan cath today. Dr. Meda Coffee reviewed risks/benefits with patient. 2D echocardiogram demonstrates decline in LVEF from 50-55% in 2018 to 35-40%. He is on ASA, heparin, BB, statin (moderate dose given h/o myopathy and LDL at goal).   2. Syncope - etiology unclear. VSS, no hypotension or bradycardia. May need event monitor at discharge if cath unrevealing. Will need advisement of no  driving x 6 months with discharge information.  3. Ischemic cardiomyopathy - variable EF over the years. Await cath for further management.  4. H/o PAF - Telemetry shows NSR with PACs/PVCs and brief run of narrow complex SVT. It's possible this could be contributing to presentation. As above, consider event monitor. Will add TSH to labs. Eliquis on hold for cath; currently on heparin.  5. Abnormal LFTs - ALT and Tbili elevated, will repeat value in AM.  6. Uncontrolled IDDM - will need close OP PCP f/u. A1c 9.2. Insulin held this AM due to plan for NPO/cath.  For questions or updates, please contact [place] Please consult [email or web address] for contact info under Cardiology/STEMI.  Signed, Charlie Pitter, PA-C 01/06/2019, 9:19 AM    The patient was seen, examined and discussed with Melina Copa, PA-C and I agree with the above.   The patient has been asymptomatic since the admission, he is scheduled for cardiac cath today.  His telemetry shows a very short episode of SVT total of 12 beats, possibly short run of atrial fibrillation that was asymptomatic.  His creatinine is normal, if he is  He does not show any significant new findings we will plan for an outpatient event monitor as of work-up for recurrent presyncope and syncope.  Ena Dawley, MD 01/06/2019

## 2019-01-06 NOTE — Brief Op Note (Signed)
BRIEF CARDIAC CATHETERIZATION NOTE  DATE: 01/06/19  Time: 2:15 PM  PATIENT:  [name]  74 y.o. male  PRE-OPERATIVE DIAGNOSIS:  unstable angina  POST-OPERATIVE DIAGNOSIS:  same  PROCEDURE:  Procedure(s): LEFT HEART CATH AND CORS/GRAFTS ANGIOGRAPHY (N/A)  SURGEON:  Surgeon(s) and Role:    * Ashlie Mcmenamy, Harrell Gave, MD - Primary  FINDINGS: 1. Severe native CAD with functionally occluded mit LAD, subtotally occluded ramus, and chronically occluded mid RCA. 2. Widely patent LIMA-LAD. 3. Chronically occluded SVG-OM1-D 4. Small and diffusely diseased SVG-rPDA-rPL with focal 90% distal stenosis, which appears stable compared with prior study in 08/2015. 5. Mildly elevated LVEDP.  RECOMMENDATIONS: 1. Overall stable appearance of coronary arteries and bypass grafts.  Consider evaluation for underlying arrhythmia leading to syncope. 2. Aggressive secondary prevention and escalation of evidence based heart failure therapy.  Nelva Bush, MD [facility]

## 2019-01-07 ENCOUNTER — Inpatient Hospital Stay (HOSPITAL_COMMUNITY)
Admission: EM | Disposition: A | Discharge status: Home / Self Care | Payer: Self-pay | Source: Home / Self Care | Attending: Cardiology

## 2019-01-07 ENCOUNTER — Inpatient Hospital Stay (HOSPITAL_COMMUNITY): Payer: Medicare Other

## 2019-01-07 HISTORY — PX: ICD IMPLANT: EP1208

## 2019-01-07 LAB — BASIC METABOLIC PANEL
Anion gap: 9 (ref 5–15)
BUN: 12 mg/dL (ref 8–23)
CO2: 26 mmol/L (ref 22–32)
Calcium: 8.6 mg/dL — ABNORMAL LOW (ref 8.9–10.3)
Chloride: 104 mmol/L (ref 98–111)
Creatinine, Ser: 1.1 mg/dL (ref 0.61–1.24)
GFR calc Af Amer: >60 mL/min (ref >60)
GFR calc non Af Amer: >60 mL/min (ref >60)
Glucose, Bld: 183 mg/dL — ABNORMAL HIGH (ref 70–99)
Potassium: 3.9 mmol/L (ref 3.5–5.1)
Sodium: 139 mmol/L (ref 135–145)

## 2019-01-07 LAB — CBC
HCT: 40 % (ref 39.0–52.0)
Hemoglobin: 12.9 g/dL — ABNORMAL LOW (ref 13.0–17.0)
MCH: 29.4 pg (ref 26.0–34.0)
MCHC: 32.3 g/dL (ref 30.0–36.0)
MCV: 91.1 fL (ref 80.0–100.0)
Platelets: 263 10*3/uL (ref 150–400)
RBC: 4.39 MIL/uL (ref 4.22–5.81)
RDW: 14.1 % (ref 11.5–15.5)
WBC: 5.7 10*3/uL (ref 4.0–10.5)
nRBC: 0 % (ref 0.0–0.2)

## 2019-01-07 LAB — HEPATIC FUNCTION PANEL
ALT: 46 U/L — ABNORMAL HIGH (ref 0–44)
AST: 17 U/L (ref 15–41)
Albumin: 3.1 g/dL — ABNORMAL LOW (ref 3.5–5.0)
Alkaline Phosphatase: 72 U/L (ref 38–126)
Bilirubin, Direct: 0.2 mg/dL (ref 0.0–0.2)
Indirect Bilirubin: 0.5 mg/dL (ref 0.3–0.9)
Total Bilirubin: 0.7 mg/dL (ref 0.3–1.2)
Total Protein: 6.6 g/dL (ref 6.5–8.1)

## 2019-01-07 LAB — GLUCOSE, CAPILLARY: Glucose-Capillary: 207 mg/dL — ABNORMAL HIGH (ref 70–99)

## 2019-01-07 SURGERY — ICD IMPLANT

## 2019-01-07 MED ORDER — IOHEXOL 350 MG/ML SOLN
INTRAVENOUS | Status: DC | PRN
  Administered 2019-01-07: 15 mL

## 2019-01-07 MED ORDER — SODIUM CHLORIDE 0.9% FLUSH: 3.0000 mL | Freq: Two times a day (BID) | INTRAVENOUS | Status: DC

## 2019-01-07 MED ORDER — HYDROCODONE-ACETAMINOPHEN 5-325 MG PO TABS: 1.0000 | ORAL_TABLET | ORAL | Status: DC | PRN

## 2019-01-07 MED ORDER — MIDAZOLAM HCL 5 MG/5ML IJ SOLN
INTRAMUSCULAR | Status: DC | PRN
  Administered 2019-01-07 (×2): 1 mg via INTRAVENOUS

## 2019-01-07 MED ORDER — SODIUM CHLORIDE 0.9 % IV SOLN: INTRAVENOUS | Status: DC

## 2019-01-07 MED ORDER — SODIUM CHLORIDE 0.9% FLUSH: 3.0000 mL | INTRAVENOUS | Status: DC | PRN

## 2019-01-07 MED ORDER — SODIUM CHLORIDE 0.9 % IV SOLN
INTRAVENOUS | Status: AC
  Filled 2019-01-07: qty 2

## 2019-01-07 MED ORDER — INSULIN ASPART 100 UNIT/ML ~~LOC~~ SOLN: 0.0000 [IU] | Freq: Three times a day (TID) | SUBCUTANEOUS | Status: DC

## 2019-01-07 MED ORDER — CEFAZOLIN SODIUM-DEXTROSE 2-4 GM/100ML-% IV SOLN
INTRAVENOUS | Status: AC
  Filled 2019-01-07: qty 100

## 2019-01-07 MED ORDER — APIXABAN 5 MG PO TABS: 5.0000 mg | ORAL_TABLET | Freq: Two times a day (BID) | ORAL | 5 refills | Status: DC

## 2019-01-07 MED ORDER — ACETAMINOPHEN 325 MG PO TABS: 325.0000 mg | ORAL_TABLET | ORAL | Status: DC | PRN

## 2019-01-07 MED ORDER — LIDOCAINE HCL (PF) 1 % IJ SOLN
INTRAMUSCULAR | Status: AC
  Filled 2019-01-07: qty 30

## 2019-01-07 MED ORDER — INSULIN ASPART 100 UNIT/ML ~~LOC~~ SOLN: 0.0000 [IU] | Freq: Every day | SUBCUTANEOUS | Status: DC

## 2019-01-07 MED ORDER — ONDANSETRON HCL 4 MG/2ML IJ SOLN: 4.0000 mg | Freq: Four times a day (QID) | INTRAMUSCULAR | Status: DC | PRN

## 2019-01-07 MED ORDER — HEPARIN (PORCINE) IN NACL 1000-0.9 UT/500ML-% IV SOLN
INTRAVENOUS | Status: AC
  Filled 2019-01-07: qty 500

## 2019-01-07 MED ORDER — HEPARIN (PORCINE) IN NACL 2-0.9 UNITS/ML
INTRAMUSCULAR | Status: AC | PRN
  Administered 2019-01-07: 500 mL

## 2019-01-07 MED ORDER — SODIUM CHLORIDE 0.9 % IV SOLN: INTRAVENOUS | Status: DC | PRN

## 2019-01-07 MED ORDER — CEFAZOLIN SODIUM-DEXTROSE 1-4 GM/50ML-% IV SOLN
1.0000 g | Freq: Four times a day (QID) | INTRAVENOUS | Status: DC
  Filled 2019-01-07 (×3): qty 50

## 2019-01-07 MED ORDER — SODIUM CHLORIDE 0.9 % IV SOLN: 250.0000 mL | INTRAVENOUS | Status: DC | PRN

## 2019-01-07 MED ORDER — LIDOCAINE HCL (PF) 1 % IJ SOLN
INTRAMUSCULAR | Status: DC | PRN
  Administered 2019-01-07: 60 mL

## 2019-01-07 MED ORDER — MIDAZOLAM HCL 5 MG/5ML IJ SOLN
INTRAMUSCULAR | Status: AC
  Filled 2019-01-07: qty 5

## 2019-01-07 SURGICAL SUPPLY — 7 items
CABLE SURGICAL S-101-97-12 (CABLE) ×3 IMPLANT
ICD VISIA MRI VR DVFB1D4 (ICD Generator) IMPLANT
LEAD SPRINT QUAT SEC 6935M-62 (Lead) ×2 IMPLANT
PAD PRO RADIOLUCENT 2001M-C (PAD) ×3 IMPLANT
SHEATH 9FR PRELUDE SNAP 13 (SHEATH) ×2 IMPLANT
TRAY PACEMAKER INSERTION (PACKS) ×3 IMPLANT
VISIA MRI VR DVFB1D4 (ICD Generator) ×3 IMPLANT

## 2019-01-07 NOTE — Interval H&P Note (Signed)
History and Physical Interval Note:  01/07/2019 11:49 AM  [name]  has presented today for surgery, with the diagnosis of syncope - cardiomyopathy.  The various methods of treatment have been discussed with the patient and family. After consideration of risks, benefits and other options for treatment, the patient has consented to  Procedure(s): ICD IMPLANT (N/A) as a surgical intervention.  The patient's history has been reviewed, patient examined, no change in status, stable for surgery.  I have reviewed the patient's chart and labs.  Questions were answered to the patient's satisfaction.    ICD Criteria  Current LVEF:35%. Within 12 months prior to implant: Yes   Heart failure history: Yes, Class II  Cardiomyopathy history: Yes, Mixed Ischemic and Non-Ischemic Cardiomyopathies.  Atrial Fibrillation/Atrial Flutter: Yes, Persistent (> 7 Days).  Ventricular tachycardia history: No.  Cardiac arrest history: No.  History of syndromes with risk of sudden death: No.  Previous ICD: No.  Current ICD indication: Secondary for treatment of syncope and structural heart disease  PPM indication: No.  Class I or II Bradycardia indication present: No  Beta Blocker therapy for 3 or more months: Yes, prescribed.   Ace Inhibitor/ARB therapy for 3 or more months: Yes, prescribed.    I have seen [name] is a 74 y.o. malepre-procedural and has been presents for consideration of ICD implant for secondary prevention of sudden death.  The patient's chart has been reviewed and they meet criteria for ICD implant.  I have had a thorough discussion with the patient reviewing options.  The patient has had opportunities to ask questions and have them answered. The patient and I have decided together through the [place] Support Tool to proceed with ICD at this time.  Risks, benefits, alternatives to ICD implantation were discussed in detail with the patient today. The patient   understands that the risks include but are not limited to bleeding, infection, pneumothorax, perforation, tamponade, vascular damage, renal failure, MI, stroke, death, inappropriate shocks, and lead dislodgement and wishes to proceed.   QRS < 130 msec, no pacing  indication.  Will therefore plan single chamber ICD.  Will use [employer] Visia AF technology to monitor for afib recurrence.  Thompson Grayer

## 2019-01-07 NOTE — Discharge Summary (Signed)
ELECTROPHYSIOLOGY PROCEDURE DISCHARGE SUMMARY    Patient ID: [name],  MRN: [id number], DOB/AGE: [birth year] 74 y.o.  Admit date: 01/05/2019 Discharge date: 01/07/2019  Primary Care Physician: [name], NP  Primary Cardiologist: Larae Grooms, MD  Electrophysiologist: New to Dr. Rayann Heman  Primary Diagnosis:  Syncope Chronic systolic CHF  Secondary Diagnosis: Mixed ischemic/non-ischemic cardiomyopathy s/p CABG Diabetes  Paroxysmal AF s/p h/o MAZE and LAA clip  Allergies  Allergen Reactions  . Lipitor [Atorvastatin] Other (See Comments)    Myopathy Spring 2006  . Pravastatin Other (See Comments)    LEG CRAMPS  . Simvastatin Other (See Comments)    LEG CRAMPS     Procedures This Admission:  1.  Implantation of a Medtronic single chamber ICD on 01/07/2019 by Dr. Rayann Heman.  The patient received a Medtronic model number [id number] ICD with model number [id number] right ventricular lead. DFT's were deferred at time of implant. There were no immediate post procedure complications. 2.  CXR on 01/07/2019 demonstrated no pneumothorax status post device implantation.  Brief HPI: [name] is a 74 y.o. male was admitted for syncope and consulted by electrophysiology  for consideration of next step in patient with known ischemic CM s/p CABG with initially improved EF. Work up showed EF back down to 35-40% range with WMA. Past medical history includes above.  With recurrent LV dysfunction despite guideline directed therapy and syncope, ICD implantation was recommended. Risks, benefits, and alternatives to ICD implantation were reviewed with the patient who wished to proceed.   Hospital Course:  The patient was admitted as above. He underwent implantation of a Medtronic single chamber ICD with details as outlined above. They were monitored on telemetry overnight which demonstrated no ventricular arrhythmias.  Left chest was without hematoma or ecchymosis, at time of discharge.  The device was interrogated and found to be functioning normally.  CXR was obtained and demonstrated no pneumothorax status post device implantation..  Wound care, arm mobility, and restrictions were reviewed with the patient.  The patient was examined and considered stable for discharge to home.   The patient's discharge medications include an ACE-I/ARB/ARNI (lisinopril) and beta blocker (carvedilol).     Reviewed [place] DMV restrictions of no driving for 6 months s/p syncope with pt and wife who verbalized understanding.  Pt and wife verbalized understanding to not restart Eliquis until Saturday, 01/11/2019.  Physical Exam: Vitals:   01/07/19 1517 01/07/19 1522 01/07/19 1527 01/07/19 1557  BP:    140/70  Pulse: (!) 0 (!) 0 (!) 0 67  Resp: (!) 0 (!) 31 (!) 0   Temp:      TempSrc:      SpO2: (!) 0% (!) 0% (!) 0% 99%  Weight:      Height:        GEN- The patient is well appearing, alert and oriented x 3 today.   HEENT: normocephalic, atraumatic; sclera clear, conjunctiva pink; hearing intact; oropharynx clear; neck supple, no JVP Lymph- no cervical lymphadenopathy Lungs- Clear to ausculation bilaterally, normal work of breathing.  No wheezes, rales, rhonchi Heart- Regular rate and rhythm, no murmurs, rubs or gallops, PMI not laterally displaced GI- soft, non-tender, non-distended, bowel sounds present, no hepatosplenomegaly Extremities- no clubbing, cyanosis, or edema; DP/PT/radial pulses 2+ bilaterally MS- no significant deformity or atrophy Skin- warm and dry, no rash or lesion. ICD site stable, with dressing C/D/I prior to discharge.  Psych- euthymic mood, full affect Neuro- strength and sensation are intact  Labs:   Lab Results  Component Value Date   WBC 5.7 01/07/2019   HGB 12.9 (L) 01/07/2019   HCT 40.0 01/07/2019   MCV 91.1 01/07/2019   PLT 263 01/07/2019    Recent Labs  Lab 01/07/19 0230  NA 139  K 3.9  CL 104  CO2 26  BUN 12  CREATININE 1.10  CALCIUM 8.6*  PROT  6.6  BILITOT 0.7  ALKPHOS 72  ALT 46*  AST 17  GLUCOSE 183*    Discharge Medications:  Allergies as of 01/07/2019      Reactions   Lipitor [atorvastatin] Other (See Comments)   Myopathy Spring 2006   Pravastatin Other (See Comments)   LEG CRAMPS   Simvastatin Other (See Comments)   LEG CRAMPS      Medication List    TAKE these medications   acetaminophen 325 MG tablet Commonly known as: TYLENOL Take 1-2 tablets (325-650 mg total) by mouth every 4 (four) hours as needed for mild pain.   albuterol 108 (90 Base) MCG/ACT inhaler Commonly known as: VENTOLIN HFA Inhale 2 puffs into the lungs every 4 (four) hours as needed for wheezing or shortness of breath (cough, shortness of breath or wheezing.).   apixaban 5 MG Tabs tablet Commonly known as: Eliquis Take 1 tablet (5 mg total) by mouth 2 (two) times daily. (restart on Saturday) Start taking on: January 11, 2019 What changed:   how much to take  additional instructions  These instructions start on January 11, 2019. If you are unsure what to do until then, ask your doctor or other care provider.   aspirin 81 MG tablet Take 81 mg by mouth daily.   carvedilol 6.25 MG tablet Commonly known as: COREG TAKE 1 TABLET BY MOUTH TWICE A DAY WITH FOOD   fexofenadine 180 MG tablet Commonly known as: ALLEGRA TAKE 1 TABLET (180 MG TOTAL) BY MOUTH DAILY. FOR ALLERGIES.   fluorometholone 0.1 % ophthalmic suspension Commonly known as: FML Place 1 drop into the right eye daily as needed (for eye redness and itching).   fluticasone 50 MCG/ACT nasal spray Commonly known as: FLONASE PLACE 1 SPRAY INTO BOTH NOSTRILS 2 (TWO) TIMES DAILY AS NEEDED FOR ALLERGIES OR RHINITIS.   insulin NPH Human 100 UNIT/ML injection Commonly known as: NovoLIN N ReliOn Inject 50 units into the skin every morning and 50 units into the skin in the evening. What changed:   how much to take  how to take this  when to take this  additional  instructions   Insulin Pen Needle 31G X 8 MM Misc Use as directed with Lantus. What changed:   how much to take  how to take this  when to take this  additional instructions   insulin regular 100 units/mL injection Commonly known as: NOVOLIN R Inject 40 Units into the skin 2 (two) times daily before a meal.   isosorbide mononitrate 30 MG 24 hr tablet Commonly known as: IMDUR Take 1 tablet (30 mg total) by mouth daily.   lisinopril 5 MG tablet Commonly known as: ZESTRIL TAKE 1 TABLET (5 MG TOTAL) DAILY BY MOUTH.   MAGNESIUM PO Take 1 tablet by mouth daily as needed (for leg cramping).   nitroGLYCERIN 0.4 MG SL tablet Commonly known as: NITROSTAT Place 1 tablet (0.4 mg total) under the tongue every 5 (five) minutes as needed for chest pain.   rosuvastatin 10 MG tablet Commonly known as: CRESTOR Take 1 tablet (10 mg total) by mouth daily.  tamsulosin 0.4 MG Caps capsule Commonly known as: FLOMAX TAKE 1 CAPSULE (0.4 MG TOTAL) BY MOUTH AS NEEDED. FOR DIFFICULTY URINATING.       Disposition:   Follow-up Information    [facility] Follow up on 01/21/2019.   Why: at 09:30 for 10-14 day wound check s/p ICD Contact information: [place] [id number] [phone]       Thompson Grayer, MD Follow up in 3 month(s).   Specialty: Cardiology Contact information: [place] [address] [phone]        Imogene Burn, PA-C Follow up on 01/21/2019.   Specialty: Cardiology Why: at 11:30 am for post-hospital general cardiology follow up. Selinda Eon is one of the PAs that works closely with Dr. Irish Lack and our cardiology team. This is the same day as your ICD wound check. Contact information: [address] [place] [address] [phone]           Duration of Discharge Encounter: Greater than 30 minutes including physician  time.  Signed, Shirley Friar, PA-C  01/07/2019 4:28 PM  Doing well s/p ICD No driving x 6 months (pt aware) Routine wound care and follow-up  Thompson Grayer MD, [facility] 01/07/2019 7:25 PM

## 2019-01-07 NOTE — Progress Notes (Signed)
Nsg Discharge Note  Admit Date:  01/05/2019 Discharge date: 01/07/2019   [name] to be D/C'd Home per MD order.  AVS completed.  Copy for chart, and copy for patient signed, and dated. Patient/caregiver able to verbalize understanding.  Discharge Medication: Allergies as of 01/07/2019      Reactions   Lipitor [atorvastatin] Other (See Comments)   Myopathy Spring 2006   Pravastatin Other (See Comments)   LEG CRAMPS   Simvastatin Other (See Comments)   LEG CRAMPS      Medication List    TAKE these medications   acetaminophen 325 MG tablet Commonly known as: TYLENOL Take 1-2 tablets (325-650 mg total) by mouth every 4 (four) hours as needed for mild pain.   albuterol 108 (90 Base) MCG/ACT inhaler Commonly known as: VENTOLIN HFA Inhale 2 puffs into the lungs every 4 (four) hours as needed for wheezing or shortness of breath (cough, shortness of breath or wheezing.).   apixaban 5 MG Tabs tablet Commonly known as: Eliquis Take 1 tablet (5 mg total) by mouth 2 (two) times daily. (restart on Saturday) Start taking on: January 11, 2019 What changed:   how much to take  additional instructions  These instructions start on January 11, 2019. If you are unsure what to do until then, ask your doctor or other care provider.   aspirin 81 MG tablet Take 81 mg by mouth daily.   carvedilol 6.25 MG tablet Commonly known as: COREG TAKE 1 TABLET BY MOUTH TWICE A DAY WITH FOOD   fexofenadine 180 MG tablet Commonly known as: ALLEGRA TAKE 1 TABLET (180 MG TOTAL) BY MOUTH DAILY. FOR ALLERGIES.   fluorometholone 0.1 % ophthalmic suspension Commonly known as: FML Place 1 drop into the right eye daily as needed (for eye redness and itching).   fluticasone 50 MCG/ACT nasal spray Commonly known as: FLONASE PLACE 1 SPRAY INTO BOTH NOSTRILS 2 (TWO) TIMES DAILY AS NEEDED FOR ALLERGIES OR RHINITIS.   insulin NPH Human 100 UNIT/ML injection Commonly known as: NovoLIN N ReliOn Inject 50  units into the skin every morning and 50 units into the skin in the evening. What changed:   how much to take  how to take this  when to take this  additional instructions   Insulin Pen Needle 31G X 8 MM Misc Use as directed with Lantus. What changed:   how much to take  how to take this  when to take this  additional instructions   insulin regular 100 units/mL injection Commonly known as: NOVOLIN R Inject 40 Units into the skin 2 (two) times daily before a meal.   isosorbide mononitrate 30 MG 24 hr tablet Commonly known as: IMDUR Take 1 tablet (30 mg total) by mouth daily.   lisinopril 5 MG tablet Commonly known as: ZESTRIL TAKE 1 TABLET (5 MG TOTAL) DAILY BY MOUTH.   MAGNESIUM PO Take 1 tablet by mouth daily as needed (for leg cramping).   nitroGLYCERIN 0.4 MG SL tablet Commonly known as: NITROSTAT Place 1 tablet (0.4 mg total) under the tongue every 5 (five) minutes as needed for chest pain.   rosuvastatin 10 MG tablet Commonly known as: CRESTOR Take 1 tablet (10 mg total) by mouth daily.   tamsulosin 0.4 MG Caps capsule Commonly known as: FLOMAX TAKE 1 CAPSULE (0.4 MG TOTAL) BY MOUTH AS NEEDED. FOR DIFFICULTY URINATING.       Discharge Assessment: Vitals:   01/07/19 1655 01/07/19 1725  BP: 134/74 140/80  Pulse:  Resp:    Temp:    SpO2:     Skin clean, dry and intact without evidence of skin break down, no evidence of skin tears noted. IV catheter discontinued intact. Site without signs and symptoms of complications - no redness or edema noted at insertion site, patient denies c/o pain - only slight tenderness at site.  Dressing with slight pressure applied.  D/c Instructions-Education: Discharge instructions given to patient/family with verbalized understanding. D/c education completed with patient/family including follow up instructions, medication list, d/c activities limitations if indicated, with other d/c instructions as indicated by MD -  patient able to verbalize understanding, all questions fully answered. Patient instructed to return to ED, call 911, or call MD for any changes in condition.  Patient escorted via [place], and D/C home via private auto.  Clydene Pugh, RN 01/07/2019 8:01 PM

## 2019-01-07 NOTE — Progress Notes (Signed)
Plan of care discussed with [name] - EP will be planning to discharge pt today pending CXR results; they will complete discharge paperwork. We have arranged general post-hospital f/u same day as wound check (tried to schedule as close together as we had available). Also has f/u in late Feb 2021 with Dr. Irish Lack which we will keep. [name] PA-C

## 2019-01-07 NOTE — Progress Notes (Signed)
Inpatient Diabetes Program Recommendations  AACE/ADA: New Consensus Statement on Inpatient Glycemic Control (2015)  Target Ranges:  Prepandial:   less than 140 mg/dL      Peak postprandial:   less than 180 mg/dL (1-2 hours)      Critically ill patients:  140 - 180 mg/dL   Lab Results  Component Value Date   GLUCAP 137 (H) 01/05/2019   HGBA1C 9.2 (H) 01/06/2019    Review of Glycemic Control Results for [name], [name] (MRN [id number]) as of 01/07/2019 12:04  Ref. Range 01/05/2019 20:32 01/05/2019 21:09  Glucose-Capillary Latest Ref Range: 70 - 99 mg/dL 145 (H) 137 (H)   Diabetes history: DM 2 Outpatient Diabetes medications:  Per chart review patient was supposed to be taking NPH 50 units bid (12/18/18) Current orders for Inpatient glycemic control:  NPH 10 units q AM Inpatient Diabetes Program Recommendations:   Please add Novolog sensitive tid with meals and HS. Called and discussed with PA and orders received.  Thanks  Adah Perl, RN, BC-ADM Inpatient Diabetes Coordinator Pager [phone] (8a-5p)

## 2019-01-07 NOTE — Discharge Instructions (Signed)
After Your ICD (Implantable Cardiac Defibrillator)   . You have a Medtronic ICD  . Do not lift your arm above shoulder height for 1 week after your procedure. After 7 days, you may progress as below.     Tuesday January 14, 2019  Wednesday January 15, 2019 Thursday January 16, 2019 Friday January 17, 2019   . Do not lift, push, pull, or carry anything over 10 pounds with the affected arm until 6 weeks (Tuesday February 18, 2019 ) after your procedure.   . Do not drive until you have been seen for your wound check, or as long as instructed by your healthcare provider.   . Monitor your defibrillator site for redness, swelling, and drainage. Call the device clinic at [phone] if you experience these symptoms or fever/chills.  . If your incision is sealed with Steri-strips or staples, you may shower 10 days after your procedure. Do not remove the steri-strips or let the shower hit directly on your site. You may wash around your site with soap and water. If your incision is closed with Dermabond/Surgical glue. You may shower 1 day after your pacemaker implant and wash around the site with soap and water. Avoid lotions, ointments, or perfumes over your incision until it is well-healed.  . You may use a hot tub or a pool AFTER your wound check appointment if the incision is completely closed.  . Your ICD  may be MRI compatible. This will be discussed at your next office visit/wound check.   . Your ICD is designed to protect you from life threatening heart rhythms. Because of this, you may receive a shock.   o 1 shock with no symptoms:  Call the office during business hours. o 1 shock with symptoms (chest pain, chest pressure, dizziness, lightheadedness, shortness of breath, overall feeling unwell):  Call 911. o If you experience 2 or more shocks in 24 hours:  Call 911. o If you receive a shock, you should not drive for 6 months per the [place] DMV IF you receive appropriate therapy from your ICD.    . ICD Alerts:  Some alerts are vibratory and others beep. These are NOT emergencies. Please call our office to let us know. If this occurs at night or on weekends, it can wait until the next business day. Send a remote transmission.  . If your device is capable of reading fluid status (for heart failure), you will be offered monthly monitoring to review this with you.   . Remote monitoring is used to monitor your ICD from home. This monitoring is scheduled every 91 days by our office. It allows [place] to keep an eye on the functioning of your device to ensure it is working properly. You will routinely see your Electrophysiologist annually (more often if necessary).    Cardioverter Defibrillator Implantation, Care After This sheet gives you information about how to care for yourself after your procedure. Your health care provider may also give you more specific instructions. If you have problems or questions, contact your health care provider. What can I expect after the procedure? After the procedure, it is common to have:  Some pain. It may last a few days.  A slight bump over the skin where the device was placed. Sometimes, it is possible to feel the device under the skin. This is normal.  During the months and years after your procedure, your health care provider will check the device, the leads, and the battery every few months. Eventually,  when the battery is low, the device will be replaced.  You should receive your defibrillator ID card for your new device in the next 4-8 weeks.  Follow these instructions at home: Medicines  Take over-the-counter and prescription medicines only as told by your health care provider.  If you were prescribed an antibiotic medicine, take it as told by your health care provider. Do not stop taking the antibiotic even if you start to feel better. Incision care        Follow instructions from your health care provider about how to take care of your  incision area. Make sure you: ? Leave stitches (sutures), skin glue, or adhesive strips in place. These skin closures may need to stay in place for 2 weeks or longer. If adhesive strip edges start to loosen and curl up, you may trim the loose edges. Do not remove adhesive strips completely unless your health care provider tells you to do that.  Check your incision area every day for signs of infection. Check for: ? More redness, swelling, or pain. ? More fluid or blood. ? Warmth. ? Pus or a bad smell.  Do not use lotions or ointments near the incision area unless told by your health care provider.  Keep the incision area clean and dry for 10 days after the procedure or for as long as told by your health care provider. It takes several weeks for the incision site to heal completely.  Do not take baths, swim, or use a hot tub until your health care provider approves. Activity  Try to walk a little every day. Exercising is important after this procedure. Also, use your shoulder on the side of the defibrillator in daily tasks that do not require a lot of motion.  For at least 1 week: ? Do not lift your upper arm above your shoulders. This means no tennis, golf, or swimming for this period of time. If you tend to sleep with your arm above your head, use a restraint to prevent this during sleep.  For at least 6 weeks: ? Avoid sudden jerking, pulling, or chopping movements that pull your upper arm far away from your body.  Ask your health care provider when you may go back to work.  Check with your health care provider before you start to drive or play sports. Electric and magnetic fields  Tell all health care providers that you have a defibrillator. This may prevent them from giving you an MRI scan because strong magnets are used for that test.  If you must pass through a metal detector, quickly walk through it. Do not stop under the detector, and do not stand near it.  Avoid places or  objects that have a strong electric or magnetic field, including: ? Airport Herbalist. At the airport, let officials know that you have a defibrillator. Your defibrillator ID card will let you be checked in a way that is safe for you and will not damage your defibrillator. Also, do not let a security person wave a magnetic wand near your defibrillator. That can make it stop working. ? Power plants. ? Large electrical generators. ? Anti-theft systems or electronic article surveillance (EAS). ? Radiofrequency transmission towers, such as cell phone and radio towers.  Do not use amateur (ham) radio equipment or electric (arc) welding torches. Some devices are safe to use if held at least 12 inches (30 cm) from your defibrillator. These include power tools, lawn mowers, and speakers. If you are  unsure if something is safe to use, ask your health care provider.  Do not use MP3 player headphones. They have magnets.  You may safely use electric blankets, heating pads, computers, and microwave ovens.  When using your cell phone, hold it to the ear that is on the opposite side from the defibrillator. Do not leave your cell phone in a pocket over the defibrillator. General instructions  Follow diet instructions from your health care provider, if this applies.  Always keep your defibrillator ID card with you. The card should list the implant date, device model, and manufacturer. Consider wearing a medical alert bracelet or necklace.  Have your defibrillator checked every 3-6 months or as often as told by your health care provider. Most defibrillators last for 4-8 years.  Keep all follow-up visits as told by your health care provider. This is important for your health care provider to make sure your chest is healing the way it should. Ask your health care provider when you should come back to have your stitches or staples taken out. Contact a health care provider if:  You gain weight  suddenly.  Your legs or feet swell more than they have before.  It feels like your heart is fluttering or skipping beats (heart palpitations).  You have more redness, swelling, or pain around your incision.  You have more fluid or blood coming from your incision.  Your incision feels warm to the touch.  You have pus or a bad smell coming from your incision.  You have a fever. Get help right away if:  You have chest pain.  You feel more than one shock.  You feel more short of breath than you have felt before.  You feel more light-headed than you have felt before.  Your incision starts to open up. This information is not intended to replace advice given to you by your health care provider. Make sure you discuss any questions you have with your health care provider.

## 2019-01-07 NOTE — H&P (View-Only) (Signed)
Electrophysiology Rounding Note  Patient Name: [name] Date of Encounter: 01/07/2019  Primary Cardiologist: Larae Grooms, MD Electrophysiologist: New to Dr. Rayann Heman  Subjective   The patient is doing well today.  At this time, the patient denies chest pain, shortness of breath, or any new concerns.  Discussed ICD at length and pt agrees to proceed.   Inpatient Medications    Scheduled Meds:  aspirin EC  81 mg Oral Daily   carvedilol  6.25 mg Oral BID WC   gentamicin irrigation  80 mg Irrigation To SSTC   insulin NPH Human  10 Units Subcutaneous QAC breakfast   isosorbide mononitrate  30 mg Oral Daily   lisinopril  5 mg Oral Daily   rosuvastatin  10 mg Oral q1800   sodium chloride flush  3 mL Intravenous Q12H   sodium chloride flush  3 mL Intravenous Q12H   Continuous Infusions:  sodium chloride     sodium chloride     sodium chloride      ceFAZolin (ANCEF) IV     PRN Meds: sodium chloride, acetaminophen, nitroGLYCERIN, ondansetron (ZOFRAN) IV, sodium chloride flush   Vital Signs    Vitals:   01/06/19 1742 01/06/19 1810 01/06/19 2118 01/07/19 0630  BP: 131/67 (!) 144/78 (!) 111/57 (!) 157/67  Pulse:  73 70 62  Resp:   20 18  Temp:   98 F (36.7 C) 98.9 F (37.2 C)  TempSrc:   Oral Oral  SpO2:  99% 96% 98%  Weight:    92.8 kg  Height:        Intake/Output Summary (Last 24 hours) at 01/07/2019 0801 Last data filed at 01/07/2019 0500 Gross per 24 hour  Intake 1401.9 ml  Output 100 ml  Net 1301.9 ml   Filed Weights   01/05/19 1434 01/06/19 0434 01/07/19 0630  Weight: 99.1 kg 93.9 kg 92.8 kg    Physical Exam    GEN- The patient is well appearing, alert and oriented x 3 today.   Head- normocephalic, atraumatic Eyes-  Sclera clear, conjunctiva pink Ears- hearing intact Oropharynx- clear Neck- supple Lungs- Clear to ausculation bilaterally, normal work of breathing Heart- Regular rate and rhythm, no murmurs, rubs or gallops GI-  soft, NT, ND, + BS Extremities- no clubbing, cyanosis, or edema Skin- no rash or lesion Psych- euthymic mood, full affect Neuro- strength and sensation are intact  Labs    CBC Recent Labs    01/05/19 0846 01/07/19 0230  WBC 6.3 5.7  HGB 13.9 12.9*  HCT 43.0 40.0  MCV 90.5 91.1  PLT 234 254   Basic Metabolic Panel Recent Labs    01/06/19 0151 01/07/19 0230  NA 137 139  K 3.8 3.9  CL 104 104  CO2 24 26  GLUCOSE 149* 183*  BUN 14 12  CREATININE 1.19 1.10  CALCIUM 8.8* 8.6*   Liver Function Tests Recent Labs    01/05/19 1205 01/07/19 0230  AST 27 17  ALT 77* 46*  ALKPHOS 78 72  BILITOT 1.3* 0.7  PROT 7.2 6.6  ALBUMIN 3.4* 3.1*   Recent Labs    01/05/19 1205  LIPASE 29   Cardiac Enzymes No results for input(s): CKTOTAL, CKMB, CKMBINDEX, TROPONINI in the last 72 hours.   Telemetry    NSR 60-70s with PACs/PVCs (personally reviewed)  Radiology    DG Chest 2 View  Result Date: 01/05/2019 CLINICAL DATA:  C/o intermittent L sided chest pain x 2 weeks with SOB and dizziness.  Reports vomiting last week. Hx CABG x5 2016 EXAM: CHEST - 2 VIEW COMPARISON:  Chest radiograph 01/09/2018 FINDINGS: Stable cardiomediastinal contours status post median sternotomy and CABG. The lungs are clear. No pneumothorax or pleural effusion. No acute finding in the visualized skeleton. IMPRESSION: No acute cardiopulmonary finding. Electronically Signed   By: Audie Pinto M.D.   On: 01/05/2019 09:09   CT Head Wo Contrast  Result Date: 01/05/2019 CLINICAL DATA:  Syncope EXAM: CT HEAD WITHOUT CONTRAST TECHNIQUE: Contiguous axial images were obtained from the base of the skull through the vertex without intravenous contrast. COMPARISON:  None. FINDINGS: Brain: Age related atrophy and minor white matter microvascular ischemic changes about the lateral ventricles. Remote lacunar type infarct in the left basal ganglia. No acute intracranial hemorrhage, mass lesion, new infarction, midline  shift, herniation, hydrocephalus, or extra-axial fluid collection. No focal mass effect or edema. Gray-white matter differentiation maintained. Cisterns remain patent. Cerebellar atrophy as well. Vascular: Intracranial atherosclerosis.  No hyperdense vessel. Skull: Normal. Negative for fracture or focal lesion. Sinuses/Orbits: No acute finding. Other: None. IMPRESSION: Atrophy and chronic white matter microvascular ischemic changes. No acute intracranial abnormality by noncontrast CT. Intracranial atherosclerosis. Electronically Signed   By: Jerilynn Mages.  Shick M.D.   On: 01/05/2019 14:02   CARDIAC CATHETERIZATION  Result Date: 01/06/2019 Conclusions: 1. Severe native coronary artery disease, as detailed below, not significant changed from prior catheterization in 2017. 2. Widely patent LIMA-LAD. 3. Patent but small and diffusely diseased SVG-rPDA-rPL, with a focal 90% stenosis just before the SVG-rPDA anastomosis.  Graft appearance is similar to prior catheterization in 2017. 4. Chronically occluded SVG-ramus-D1. 5. Mildly elevated left ventricular filling pressure. Recommendations: 1. Given stable appearance of coronary artery disease and negligible troponin, I do not believe that worsening CAD is driving his dizziness/syncope.  Further evaluation for potential arrhythmic cause is recommended. 2. Continue medical therapy and aggressive secondary prevention. 3. Restart apixaban tomorrow if no evidence of bleeding/vascular complications. Nelva Bush, MD [facility]   ECHOCARDIOGRAM COMPLETE  Result Date: 01/06/2019   ECHOCARDIOGRAM REPORT   Patient Name:   [name] Date of Exam: 01/06/2019 Medical Rec #:  [id number]      Height:       68.0 in Accession #:    [id number]     Weight:       207.1 lb Date of Birth:  10-Jan-1945       BSA:          2.07 m Patient Age:    74 years       BP:           127/88 mmHg Patient Gender: M              HR:           69 bpm. Exam Location:  Inpatient Procedure: 2D Echo, Cardiac  Doppler and Color Doppler Indications:    R07.9* Chest pain, unspecified  History:        Patient has prior history of Echocardiogram examinations, most                 recent 01/10/2016. Cardiomyopathy, CAD and Previous Myocardial                 Infarction, Abnormal ECG and Prior CABG, Pulmonary HTN,                 Signs/Symptoms:Chest Pain; Risk Factors:Diabetes and  Hypertension.  Sonographer:    [place] Referring Phys: [id number] [place]  1. Left ventricular ejection fraction, by visual estimation, is 35 to 40%. The left ventricle has moderately decreased function. There is moderately increased left ventricular hypertrophy.  2. Left ventricular diastolic parameters are consistent with Grade III diastolic dysfunction (restrictive).  3. The left ventricle has no regional wall motion abnormalities.  4. Global right ventricle has moderately reduced systolic function.The right ventricular size is moderately enlarged. No increase in right ventricular wall thickness.  5. Left atrial size was normal.  6. Right atrial size was normal.  7. The mitral valve is normal in structure. Mild mitral valve regurgitation.  8. The tricuspid valve is grossly normal.  9. The aortic valve is normal in structure. Aortic valve regurgitation is not visualized. No evidence of aortic valve sclerosis or stenosis. 10. The pulmonic valve was grossly normal. Pulmonic valve regurgitation is not visualized. 11. Moderately elevated pulmonary artery systolic pressure. 12. The atrial septum is grossly normal. FINDINGS  Left Ventricle: Left ventricular ejection fraction, by visual estimation, is 35 to 40%. The left ventricle has moderately decreased function. The left ventricle has no regional wall motion abnormalities. There is moderately increased left ventricular hypertrophy. Concentric left ventricular hypertrophy. Left ventricular diastolic parameters are consistent with Grade III diastolic dysfunction  (restrictive). Right Ventricle: The right ventricular size is moderately enlarged. No increase in right ventricular wall thickness. Global RV systolic function is has moderately reduced systolic function. The tricuspid regurgitant velocity is 3.35 m/s, and with an assumed right atrial pressure of 15 mmHg, the estimated right ventricular systolic pressure is moderately elevated at 60.0 mmHg. Left Atrium: Left atrial size was normal in size. Right Atrium: Right atrial size was normal in size Pericardium: There is no evidence of pericardial effusion. Mitral Valve: The mitral valve is normal in structure. Mild mitral valve regurgitation. Tricuspid Valve: The tricuspid valve is grossly normal. Tricuspid valve regurgitation is mild. Aortic Valve: The aortic valve is normal in structure. Aortic valve regurgitation is not visualized. The aortic valve is structurally normal, with no evidence of sclerosis or stenosis. Pulmonic Valve: The pulmonic valve was grossly normal. Pulmonic valve regurgitation is not visualized. Pulmonic regurgitation is not visualized. Aorta: The aortic root and ascending aorta are structurally normal, with no evidence of dilitation. IAS/Shunts: The atrial septum is grossly normal.  LEFT VENTRICLE PLAX 2D LVIDd:         5.29 cm LVIDs:         4.09 cm LV PW:         1.36 cm LV IVS:        1.40 cm LVOT diam:     2.00 cm LV SV:         61 ml LV SV Index:   28.33 LVOT Area:     3.14 cm  LV Volumes (MOD) LV area d, A2C:    35.80 cm LV area d, A4C:    29.70 cm LV area s, A2C:    28.90 cm LV area s, A4C:    20.70 cm LV major d, A2C:   8.52 cm LV major d, A4C:   7.98 cm LV major s, A2C:   7.85 cm LV major s, A4C:   7.07 cm LV vol d, MOD A2C: 127.0 ml LV vol d, MOD A4C: 91.9 ml LV vol s, MOD A2C: 91.2 ml LV vol s, MOD A4C: 52.7 ml LV SV MOD A2C:     35.8  ml LV SV MOD A4C:     91.9 ml LV SV MOD BP:      39.1 ml RIGHT VENTRICLE         IVC TAPSE (M-mode): 1.4 cm  IVC diam: 2.13 cm LEFT ATRIUM              Index       RIGHT ATRIUM           Index LA diam:        3.40 cm 1.64 cm/m  RA Area:     14.80 cm LA Vol (A2C):   49.5 ml 23.86 ml/m RA Volume:   32.60 ml  15.72 ml/m LA Vol (A4C):   39.2 ml 18.90 ml/m LA Biplane Vol: 44.3 ml 21.36 ml/m  AORTIC VALVE LVOT Vmax:   95.30 cm/s LVOT Vmean:  63.700 cm/s LVOT VTI:    0.201 m  AORTA Ao Root diam: 3.30 cm Ao Asc diam:  3.30 cm MITRAL VALVE                       TRICUSPID VALVE MV Area (PHT): 4.65 cm            TR Peak grad:   45.0 mmHg MV PHT:        47.27 msec          TR Vmax:        364.00 cm/s MV Decel Time: 163 msec MV E velocity: 99.43 cm/s 103 cm/s SHUNTS                                    Systemic VTI:  0.20 m                                    Systemic Diam: 2.00 cm  Mertie Moores MD Electronically signed by Mertie Moores MD Signature Date/Time: 01/06/2019/9:23:56 AM    Final     Patient Profile     CHRISTIEN FRANKL is a 74 y.o. male with a history of paroxysmal A fib s/p MAZE and LAA clip, on longterm Eliquis, CAD s/p CABG 06/2014, DM2, HTN who is being seen for the evaluation of syncope at the request of Dr. Meda Coffee.  Assessment & Plan    1.  Syncope Discussed among Dr. Caryl Comes, Dr. Irish Lack, and Dr. Rayann Heman With known ICM, WMA, and frequent PVCs, suspicion for tachyarrhythmia is very high Plan for ICD today by Dr. Rayann Heman.  Discussed risks and benefits at length with patient today who agrees to proceed.  QRS 110 ms so not CRT candidate.   2. Ischemic cardiomyopathy EF 35-40% down from 50-55% in the past.  ? Contribution of PVCs noted on tele.   3. CAD s/p CABG Overall stable CAD and patent grafts by cath 01/06/19 Denies ischemic symptoms. Continue medical management.   4. H/o AF s/p MAZE and LAA clip On Eliquis as OP. Held for for cath -> ICD.   5. Uncontrolled IDDM A1c 9.2 on admission. Will need close PCP follow up.  Reviewed wound care and arm restrictions carefully.   For questions or updates, please contact [facility] Please consult [email or web address] for contact info under Cardiology/STEMI.  Signed, Shirley Friar, PA-C  01/07/2019, 8:01 AM   I have seen, examined the patient, and reviewed the above assessment and  plan.  Changes to above are made where necessary.  On exam, RRR.  Patient with structural heart disease and syncope.  I agree with Dr Caryl Comes that ICD is indicated.  Risks and benefits of the procedure were discussed with the patient who wishes to proceed.  Co Sign: Thompson Grayer, MD 01/07/2019 11:47 AM

## 2019-01-07 NOTE — Progress Notes (Addendum)
Electrophysiology Rounding Note  Patient Name: [name] Date of Encounter: 01/07/2019  Primary Cardiologist: Larae Grooms, MD Electrophysiologist: New to Dr. Rayann Heman  Subjective   The patient is doing well today.  At this time, the patient denies chest pain, shortness of breath, or any new concerns.  Discussed ICD at length and pt agrees to proceed.   Inpatient Medications    Scheduled Meds: . aspirin EC  81 mg Oral Daily  . carvedilol  6.25 mg Oral BID WC  . gentamicin irrigation  80 mg Irrigation To SSTC  . insulin NPH Human  10 Units Subcutaneous QAC breakfast  . isosorbide mononitrate  30 mg Oral Daily  . lisinopril  5 mg Oral Daily  . rosuvastatin  10 mg Oral q1800  . sodium chloride flush  3 mL Intravenous Q12H  . sodium chloride flush  3 mL Intravenous Q12H   Continuous Infusions: . sodium chloride    . sodium chloride    . sodium chloride    .  ceFAZolin (ANCEF) IV     PRN Meds: sodium chloride, acetaminophen, nitroGLYCERIN, ondansetron (ZOFRAN) IV, sodium chloride flush   Vital Signs    Vitals:   01/06/19 1742 01/06/19 1810 01/06/19 2118 01/07/19 0630  BP: 131/67 (!) 144/78 (!) 111/57 (!) 157/67  Pulse:  73 70 62  Resp:   20 18  Temp:   98 F (36.7 C) 98.9 F (37.2 C)  TempSrc:   Oral Oral  SpO2:  99% 96% 98%  Weight:    92.8 kg  Height:        Intake/Output Summary (Last 24 hours) at 01/07/2019 0801 Last data filed at 01/07/2019 0500 Gross per 24 hour  Intake 1401.9 ml  Output 100 ml  Net 1301.9 ml   Filed Weights   01/05/19 1434 01/06/19 0434 01/07/19 0630  Weight: 99.1 kg 93.9 kg 92.8 kg    Physical Exam    GEN- The patient is well appearing, alert and oriented x 3 today.   Head- normocephalic, atraumatic Eyes-  Sclera clear, conjunctiva pink Ears- hearing intact Oropharynx- clear Neck- supple Lungs- Clear to ausculation bilaterally, normal work of breathing Heart- Regular rate and rhythm, no murmurs, rubs or gallops GI-  soft, NT, ND, + BS Extremities- no clubbing, cyanosis, or edema Skin- no rash or lesion Psych- euthymic mood, full affect Neuro- strength and sensation are intact  Labs    CBC Recent Labs    01/05/19 0846 01/07/19 0230  WBC 6.3 5.7  HGB 13.9 12.9*  HCT 43.0 40.0  MCV 90.5 91.1  PLT 234 767   Basic Metabolic Panel Recent Labs    01/06/19 0151 01/07/19 0230  NA 137 139  K 3.8 3.9  CL 104 104  CO2 24 26  GLUCOSE 149* 183*  BUN 14 12  CREATININE 1.19 1.10  CALCIUM 8.8* 8.6*   Liver Function Tests Recent Labs    01/05/19 1205 01/07/19 0230  AST 27 17  ALT 77* 46*  ALKPHOS 78 72  BILITOT 1.3* 0.7  PROT 7.2 6.6  ALBUMIN 3.4* 3.1*   Recent Labs    01/05/19 1205  LIPASE 29   Cardiac Enzymes No results for input(s): CKTOTAL, CKMB, CKMBINDEX, TROPONINI in the last 72 hours.   Telemetry    NSR 60-70s with PACs/PVCs (personally reviewed)  Radiology    DG Chest 2 View  Result Date: 01/05/2019 CLINICAL DATA:  C/o intermittent L sided chest pain x 2 weeks with SOB and dizziness.  Reports vomiting last week. Hx CABG x5 2016 EXAM: CHEST - 2 VIEW COMPARISON:  Chest radiograph 01/09/2018 FINDINGS: Stable cardiomediastinal contours status post median sternotomy and CABG. The lungs are clear. No pneumothorax or pleural effusion. No acute finding in the visualized skeleton. IMPRESSION: No acute cardiopulmonary finding. Electronically Signed   By: Audie Pinto M.D.   On: 01/05/2019 09:09   CT Head Wo Contrast  Result Date: 01/05/2019 CLINICAL DATA:  Syncope EXAM: CT HEAD WITHOUT CONTRAST TECHNIQUE: Contiguous axial images were obtained from the base of the skull through the vertex without intravenous contrast. COMPARISON:  None. FINDINGS: Brain: Age related atrophy and minor white matter microvascular ischemic changes about the lateral ventricles. Remote lacunar type infarct in the left basal ganglia. No acute intracranial hemorrhage, mass lesion, new infarction, midline  shift, herniation, hydrocephalus, or extra-axial fluid collection. No focal mass effect or edema. Gray-white matter differentiation maintained. Cisterns remain patent. Cerebellar atrophy as well. Vascular: Intracranial atherosclerosis.  No hyperdense vessel. Skull: Normal. Negative for fracture or focal lesion. Sinuses/Orbits: No acute finding. Other: None. IMPRESSION: Atrophy and chronic white matter microvascular ischemic changes. No acute intracranial abnormality by noncontrast CT. Intracranial atherosclerosis. Electronically Signed   By: Jerilynn Mages.  Shick M.D.   On: 01/05/2019 14:02   CARDIAC CATHETERIZATION  Result Date: 01/06/2019 Conclusions: 1. Severe native coronary artery disease, as detailed below, not significant changed from prior catheterization in 2017. 2. Widely patent LIMA-LAD. 3. Patent but small and diffusely diseased SVG-rPDA-rPL, with a focal 90% stenosis just before the SVG-rPDA anastomosis.  Graft appearance is similar to prior catheterization in 2017. 4. Chronically occluded SVG-ramus-D1. 5. Mildly elevated left ventricular filling pressure. Recommendations: 1. Given stable appearance of coronary artery disease and negligible troponin, I do not believe that worsening CAD is driving his dizziness/syncope.  Further evaluation for potential arrhythmic cause is recommended. 2. Continue medical therapy and aggressive secondary prevention. 3. Restart apixaban tomorrow if no evidence of bleeding/vascular complications. Nelva Bush, MD [facility]   ECHOCARDIOGRAM COMPLETE  Result Date: 01/06/2019   ECHOCARDIOGRAM REPORT   Patient Name:   [name] Date of Exam: 01/06/2019 Medical Rec #:  [id number]      Height:       68.0 in Accession #:    [id number]     Weight:       207.1 lb Date of Birth:  1945/07/04       BSA:          2.07 m Patient Age:    74 years       BP:           127/88 mmHg Patient Gender: M              HR:           69 bpm. Exam Location:  Inpatient Procedure: 2D Echo, Cardiac  Doppler and Color Doppler Indications:    R07.9* Chest pain, unspecified  History:        Patient has prior history of Echocardiogram examinations, most                 recent 01/10/2016. Cardiomyopathy, CAD and Previous Myocardial                 Infarction, Abnormal ECG and Prior CABG, Pulmonary HTN,                 Signs/Symptoms:Chest Pain; Risk Factors:Diabetes and  Hypertension.  Sonographer:    [place] Referring Phys: [id number] [place]  1. Left ventricular ejection fraction, by visual estimation, is 35 to 40%. The left ventricle has moderately decreased function. There is moderately increased left ventricular hypertrophy.  2. Left ventricular diastolic parameters are consistent with Grade III diastolic dysfunction (restrictive).  3. The left ventricle has no regional wall motion abnormalities.  4. Global right ventricle has moderately reduced systolic function.The right ventricular size is moderately enlarged. No increase in right ventricular wall thickness.  5. Left atrial size was normal.  6. Right atrial size was normal.  7. The mitral valve is normal in structure. Mild mitral valve regurgitation.  8. The tricuspid valve is grossly normal.  9. The aortic valve is normal in structure. Aortic valve regurgitation is not visualized. No evidence of aortic valve sclerosis or stenosis. 10. The pulmonic valve was grossly normal. Pulmonic valve regurgitation is not visualized. 11. Moderately elevated pulmonary artery systolic pressure. 12. The atrial septum is grossly normal. FINDINGS  Left Ventricle: Left ventricular ejection fraction, by visual estimation, is 35 to 40%. The left ventricle has moderately decreased function. The left ventricle has no regional wall motion abnormalities. There is moderately increased left ventricular hypertrophy. Concentric left ventricular hypertrophy. Left ventricular diastolic parameters are consistent with Grade III diastolic dysfunction  (restrictive). Right Ventricle: The right ventricular size is moderately enlarged. No increase in right ventricular wall thickness. Global RV systolic function is has moderately reduced systolic function. The tricuspid regurgitant velocity is 3.35 m/s, and with an assumed right atrial pressure of 15 mmHg, the estimated right ventricular systolic pressure is moderately elevated at 60.0 mmHg. Left Atrium: Left atrial size was normal in size. Right Atrium: Right atrial size was normal in size Pericardium: There is no evidence of pericardial effusion. Mitral Valve: The mitral valve is normal in structure. Mild mitral valve regurgitation. Tricuspid Valve: The tricuspid valve is grossly normal. Tricuspid valve regurgitation is mild. Aortic Valve: The aortic valve is normal in structure. Aortic valve regurgitation is not visualized. The aortic valve is structurally normal, with no evidence of sclerosis or stenosis. Pulmonic Valve: The pulmonic valve was grossly normal. Pulmonic valve regurgitation is not visualized. Pulmonic regurgitation is not visualized. Aorta: The aortic root and ascending aorta are structurally normal, with no evidence of dilitation. IAS/Shunts: The atrial septum is grossly normal.  LEFT VENTRICLE PLAX 2D LVIDd:         5.29 cm LVIDs:         4.09 cm LV PW:         1.36 cm LV IVS:        1.40 cm LVOT diam:     2.00 cm LV SV:         61 ml LV SV Index:   28.33 LVOT Area:     3.14 cm  LV Volumes (MOD) LV area d, A2C:    35.80 cm LV area d, A4C:    29.70 cm LV area s, A2C:    28.90 cm LV area s, A4C:    20.70 cm LV major d, A2C:   8.52 cm LV major d, A4C:   7.98 cm LV major s, A2C:   7.85 cm LV major s, A4C:   7.07 cm LV vol d, MOD A2C: 127.0 ml LV vol d, MOD A4C: 91.9 ml LV vol s, MOD A2C: 91.2 ml LV vol s, MOD A4C: 52.7 ml LV SV MOD A2C:     35.8  ml LV SV MOD A4C:     91.9 ml LV SV MOD BP:      39.1 ml RIGHT VENTRICLE         IVC TAPSE (M-mode): 1.4 cm  IVC diam: 2.13 cm LEFT ATRIUM              Index       RIGHT ATRIUM           Index LA diam:        3.40 cm 1.64 cm/m  RA Area:     14.80 cm LA Vol (A2C):   49.5 ml 23.86 ml/m RA Volume:   32.60 ml  15.72 ml/m LA Vol (A4C):   39.2 ml 18.90 ml/m LA Biplane Vol: 44.3 ml 21.36 ml/m  AORTIC VALVE LVOT Vmax:   95.30 cm/s LVOT Vmean:  63.700 cm/s LVOT VTI:    0.201 m  AORTA Ao Root diam: 3.30 cm Ao Asc diam:  3.30 cm MITRAL VALVE                       TRICUSPID VALVE MV Area (PHT): 4.65 cm            TR Peak grad:   45.0 mmHg MV PHT:        47.27 msec          TR Vmax:        364.00 cm/s MV Decel Time: 163 msec MV E velocity: 99.43 cm/s 103 cm/s SHUNTS                                    Systemic VTI:  0.20 m                                    Systemic Diam: 2.00 cm  Mertie Moores MD Electronically signed by Mertie Moores MD Signature Date/Time: 01/06/2019/9:23:56 AM    Final     Patient Profile     ROHNAN BARTLESON is a 74 y.o. male with a history of paroxysmal A fib s/p MAZE and LAA clip, on longterm Eliquis, CAD s/p CABG 06/2014, DM2, HTN who is being seen for the evaluation of syncope at the request of Dr. Meda Coffee.  Assessment & Plan    1.  Syncope Discussed among Dr. Caryl Comes, Dr. Irish Lack, and Dr. Rayann Heman With known ICM, WMA, and frequent PVCs, suspicion for tachyarrhythmia is very high Plan for ICD today by Dr. Rayann Heman.  Discussed risks and benefits at length with patient today who agrees to proceed.  QRS 110 ms so not CRT candidate.   2. Ischemic cardiomyopathy EF 35-40% down from 50-55% in the past.  ? Contribution of PVCs noted on tele.   3. CAD s/p CABG Overall stable CAD and patent grafts by cath 01/06/19 Denies ischemic symptoms. Continue medical management.   4. H/o AF s/p MAZE and LAA clip On Eliquis as OP. Held for for cath -> ICD.   5. Uncontrolled IDDM A1c 9.2 on admission. Will need close PCP follow up.  Reviewed wound care and arm restrictions carefully.   For questions or updates, please contact [place] Please consult [email or web address] for contact info under Cardiology/STEMI.  Signed, Shirley Friar, PA-C  01/07/2019, 8:01 AM   I have seen, examined the patient, and reviewed the above assessment and  plan.  Changes to above are made where necessary.  On exam, RRR.  Patient with structural heart disease and syncope.  I agree with Dr Caryl Comes that ICD is indicated.  Risks and benefits of the procedure were discussed with the patient who wishes to proceed.  Co Sign: Thompson Grayer, MD 01/07/2019 11:47 AM

## 2019-01-08 ENCOUNTER — Telehealth: Payer: Self-pay | Admitting: Interventional Cardiology

## 2019-01-08 ENCOUNTER — Telehealth: Payer: Self-pay

## 2019-01-08 MED FILL — Cefazolin Sodium-Dextrose IV Solution 2 GM/100ML-4%: INTRAVENOUS | Qty: 100 | Status: AC

## 2019-01-08 NOTE — Telephone Encounter (Signed)
New Message  Patient's wife is calling in to double check to make sure that patient was not supposed to be sent home with antibiotics following his procedure. Please call patient's wife back to confirm.

## 2019-01-08 NOTE — Telephone Encounter (Signed)
Returned call to Pt.    Advised he did not need any additional antibiotics.  Pt indicates understanding.

## 2019-01-08 NOTE — Telephone Encounter (Signed)
I called the pt to get the model/serial number for his home remote monitor to put into [facility].

## 2019-01-15 NOTE — Progress Notes (Signed)
Cardiology Office Note    Date:  01/21/2019   ID:  [name], DOB 18-Jul-1945, MRN [id number]  PCP:  [name], NP  Cardiologist: Larae Grooms, MD EPS: None  Chief Complaint  Patient presents with  . Hospitalization Follow-up    History of Present Illness:  [name] is a 74 y.o. male with history of CAD status post PCI of the LAD 05/2014 with rotational atherectomy, early restenosis status post CABG 06/2014, Maze procedure and left atrial appendage clip was placed.  EF initially was 40 to 45% and then normalized to 55%.  NSTEMI 08/2015 occluded SVG to the RCA treated medically EF 25 to 30% on cath 50 to 55% on echo.  Patient was admitted with syncope 01/04/2018.  Patient underwent cardiac cath 01/06/2019 and had stable coronary artery disease that was not felt to be the cause of his syncope.  2D echo 01/06/2019 LVEF 35 to 40% with moderate LVH and grade 3 DD that was restrictive.  Patient underwent implantation of Medtronic ICD 01/07/2019 by Dr. Rayann Heman without complication.   Patient comes in for f/u. No ICD discharges. No chest pain, shortness of breath, dizziness or presyncope. Frustrated he can't drive for 6 months. Trouble affording eliquis.   Past Medical History:  Diagnosis Date  . Arthritis    "fingers, shoulders" (08/03/2015)  . Atrial fibrillation (HCC)    Paroxysmal, rare episodes, sinus rhythm on flecainide, patient prefers not to take Coumadin  . Bradycardia    April, 2013  . Chest pain    Nuclear, 2006, no ischemia  . Chronic lower back pain    "all my life"  . Coronary artery disease    s/p staged cath 05/12/2014 and 5/11, DES x 2 to heavily calcified RCA, residual with 60% prox LAD, 70% D1  . Ejection fraction    EF 55-60%,  echo, January, 2011  . Elevated CPK    CPK elevated with normal MB and normal troponin the past  . Heart murmur   . History of echocardiogram    Echo 8/16: EF 55%, normal wall motion, grade 2 diastolic dysfunction, trivial MR,  normal RV function, PASP 27 mmHg  . Hypertension   . IBS (irritable bowel syndrome)   . Myocardial infarction ([facility]) 06/2014  . Sinus drainage   . Type II diabetes mellitus ([place])     Past Surgical History:  Procedure Laterality Date  . APPENDECTOMY    . CARDIAC CATHETERIZATION N/A 05/12/2014   Procedure: Left Heart Cath and Coronary Angiography;  Surgeon: Troy Sine, MD;  Location: [facility];  Service: Cardiovascular;  Laterality: N/A;  . CARDIAC CATHETERIZATION N/A 05/13/2014   Procedure: Coronary/Graft Atherectomy;  Surgeon: Troy Sine, MD;  Location: [facility];  Service: Cardiovascular;  Laterality: N/A;  . CARDIAC CATHETERIZATION Right 05/13/2014   Procedure: Temporary Pacemaker;  Surgeon: Troy Sine, MD;  Location: [facility];  Service: Cardiovascular;  Laterality: Right;  . CARDIAC CATHETERIZATION N/A 05/13/2014   Procedure: Coronary Stent Intervention;  Surgeon: Troy Sine, MD;  Location: [facility];  Service: Cardiovascular;  Laterality: N/A;  . CARDIAC CATHETERIZATION N/A 06/18/2014   Procedure: Left Heart Cath and Coronary Angiography;  Surgeon: Peter M Martinique, MD;  Location: [facility];  Service: Cardiovascular;  Laterality: N/A;  . CARDIAC CATHETERIZATION N/A 08/03/2015   Procedure: Left Heart Cath and Cors/Grafts Angiography;  Surgeon: Nelva Bush, MD;  Location: [facility];  Service: Cardiovascular;  Laterality: N/A;  . CARDIOVASCULAR STRESS TEST  05/06/2014  . CATARACT EXTRACTION W/ INTRAOCULAR LENS IMPLANT Left   . CORONARY ARTERY BYPASS GRAFT N/A 06/22/2014   Procedure: CORONARY ARTERY BYPASS GRAFTING (CABG) x five, using left internal mammary artery, and right leg greater saphenous vein harvested endoscopically;  Surgeon: Melrose Nakayama, MD;  Location: [facility];  Service: Open Heart Surgery;  Laterality: N/A;  . ICD IMPLANT N/A 01/07/2019   Procedure: ICD IMPLANT;  Surgeon: Thompson Grayer, MD;  Location: [facility];  Service: Cardiovascular;  Laterality: N/A;  . LAPAROSCOPIC CHOLECYSTECTOMY    . LEFT HEART CATH AND CORS/GRAFTS ANGIOGRAPHY N/A 01/06/2019   Procedure: LEFT HEART CATH AND CORS/GRAFTS ANGIOGRAPHY;  Surgeon: Nelva Bush, MD;  Location: [facility];  Service: Cardiovascular;  Laterality: N/A;  . MAZE N/A 06/22/2014   Procedure: MAZE;  Surgeon: Melrose Nakayama, MD;  Location: [facility];  Service: Open Heart Surgery;  Laterality: N/A;  . TEE WITHOUT CARDIOVERSION N/A 06/22/2014   Procedure: TRANSESOPHAGEAL ECHOCARDIOGRAM (TEE);  Surgeon: Melrose Nakayama, MD;  Location: [facility];  Service: Open Heart Surgery;  Laterality: N/A;    Current Medications: Current Meds  Medication Sig  . acetaminophen (TYLENOL) 325 MG tablet Take 1-2 tablets (325-650 mg total) by mouth every 4 (four) hours as needed for mild pain.  Marland Kitchen albuterol (VENTOLIN HFA) 108 (90 Base) MCG/ACT inhaler Inhale 2 puffs into the lungs every 4 (four) hours as needed for wheezing or shortness of breath (cough, shortness of breath or wheezing.).  Marland Kitchen apixaban (ELIQUIS) 5 MG TABS tablet Take 1 tablet (5 mg total) by mouth 2 (two) times daily. (restart on Saturday)  . aspirin 81 MG tablet Take 81 mg by mouth daily.  . carvedilol (COREG) 6.25 MG tablet TAKE 1 TABLET BY MOUTH TWICE A DAY WITH FOOD  . fexofenadine (ALLEGRA) 180 MG tablet TAKE 1 TABLET (180 MG TOTAL) BY MOUTH DAILY. FOR ALLERGIES.  . fluorometholone (FML) 0.1 % ophthalmic suspension Place 1 drop into the right eye daily as needed (for eye redness and itching).   . fluticasone (FLONASE) 50 MCG/ACT nasal spray PLACE 1 SPRAY INTO BOTH NOSTRILS 2 (TWO) TIMES DAILY AS NEEDED FOR ALLERGIES OR RHINITIS.  Marland Kitchen insulin NPH Human (NOVOLIN N RELION) 100 UNIT/ML injection Inject 50 units into the skin every morning and 50 units into the skin in the evening.  . Insulin Pen Needle 31G X 8 MM MISC Use as directed with Lantus.  . insulin regular (NOVOLIN R) 100 units/mL  injection Inject 40 Units into the skin 2 (two) times daily before a meal.  . isosorbide mononitrate (IMDUR) 30 MG 24 hr tablet Take 1 tablet (30 mg total) by mouth daily.  Marland Kitchen lisinopril (ZESTRIL) 5 MG tablet TAKE 1 TABLET (5 MG TOTAL) DAILY BY MOUTH.  Marland Kitchen MAGNESIUM PO Take 1 tablet by mouth daily as needed (for leg cramping).   . nitroGLYCERIN (NITROSTAT) 0.4 MG SL tablet Place 1 tablet (0.4 mg total) under the tongue every 5 (five) minutes as needed for chest pain.  . rosuvastatin (CRESTOR) 10 MG tablet Take 1 tablet (10 mg total) by mouth daily.  . tamsulosin (FLOMAX) 0.4 MG CAPS capsule TAKE 1 CAPSULE (0.4 MG TOTAL) BY MOUTH AS NEEDED. FOR DIFFICULTY URINATING.     Allergies:   Lipitor [atorvastatin], Pravastatin, and Simvastatin   Social History   Socioeconomic History  . Marital status: Married    Spouse name: Not on file  .  Number of children: Not on file  . Years of education: Not on file  . Highest education level: Not on file  Occupational History  . Not on file  Tobacco Use  . Smoking status: Never Smoker  . Smokeless tobacco: Never Used  Substance and Sexual Activity  . Alcohol use: No  . Drug use: No  . Sexual activity: Not on file  Other Topics Concern  . Not on file  Social History Narrative  . Not on file   Social Determinants of Health   Financial Resource Strain: Low Risk   . Difficulty of Paying Living Expenses: Not hard at all  Food Insecurity: No Food Insecurity  . Worried About Charity fundraiser in the Last Year: Never true  . Ran Out of Food in the Last Year: Never true  Transportation Needs: No Transportation Needs  . Lack of Transportation (Medical): No  . Lack of Transportation (Non-Medical): No  Physical Activity: Unknown  . Days of Exercise per Week: 3 days  . Minutes of Exercise per Session: Not on file  Stress: No Stress Concern Present  . Feeling of Stress : Not at all  Social Connections:   . Frequency of Communication with Friends and  Family: Not on file  . Frequency of Social Gatherings with Friends and Family: Not on file  . Attends Religious Services: Not on file  . Active Member of Clubs or Organizations: Not on file  . Attends Archivist Meetings: Not on file  . Marital Status: Not on file     Family History:  The patient's   family history includes Alzheimer's disease in his mother; Arrhythmia in his sister; Cancer in his brother; Emphysema in his father; Heart attack in his sister; Heart disease in his sister; Hyperlipidemia in his sister; Hypertension in his sister.   ROS:   Please see the history of present illness.    ROS All other systems reviewed and are negative.   PHYSICAL EXAM:   VS:  BP 106/60   Pulse 68   Ht 5' 8"  (1.727 m)   Wt 206 lb (93.4 kg)   SpO2 97%   BMI 31.32 kg/m   Physical Exam  GEN: Well nourished, well developed, in no acute distress   Neck: no JVD, carotid bruits, or masses Cardiac:RRR; 1/6 systolic murmur the left sternal border, incision healing well at ICD site, no hematoma Respiratory:  clear to auscultation bilaterally, normal work of breathing GI: soft, nontender, nondistended, + BS Ext: without cyanosis, clubbing, or edema, Good distal pulses bilaterally MS: no deformity or atrophy  Skin: warm and dry, no rash Neuro:  Alert and Oriented x 3 Psych: euthymic mood, full affect  Wt Readings from Last 3 Encounters:  01/21/19 206 lb (93.4 kg)  01/07/19 204 lb 9.6 oz (92.8 kg)  12/18/18 212 lb (96.2 kg)      Studies/Labs Reviewed:   EKG:  EKG is not ordered today.   Recent Labs: 01/06/2019: TSH 1.529 01/07/2019: ALT 46; BUN 12; Creatinine, Ser 1.10; Hemoglobin 12.9; Platelets 263; Potassium 3.9; Sodium 139   Lipid Panel    Component Value Date/Time   CHOL 117 01/06/2019 0151   TRIG 112 01/06/2019 0151   HDL 29 (L) 01/06/2019 0151   CHOLHDL 4.0 01/06/2019 0151   VLDL 22 01/06/2019 0151   LDLCALC 66 01/06/2019 0151    Additional studies/ records that  were reviewed today include:  2D echo 01/06/2019  IMPRESSIONS  1. Left ventricular ejection fraction, by visual estimation, is 35 to 40%. The left ventricle has moderately decreased function. There is moderately increased left ventricular hypertrophy.  2. Left ventricular diastolic parameters are consistent with Grade III diastolic dysfunction (restrictive).  3. The left ventricle has no regional wall motion abnormalities.  4. Global right ventricle has moderately reduced systolic function.The right ventricular size is moderately enlarged. No increase in right ventricular wall thickness.  5. Left atrial size was normal.  6. Right atrial size was normal.  7. The mitral valve is normal in structure. Mild mitral valve regurgitation.  8. The tricuspid valve is grossly normal.  9. The aortic valve is normal in structure. Aortic valve regurgitation is not visualized. No evidence of aortic valve sclerosis or stenosis. 10. The pulmonic valve was grossly normal. Pulmonic valve regurgitation is not visualized. 11. Moderately elevated pulmonary artery systolic pressure. 12. The atrial septum is grossly normal.  1. Catheterization 1/4/2021Severe native coronary artery disease, as detailed below, not significant changed from prior catheterization in 2017. 2. Widely patent LIMA-LAD. 3. Patent but small and diffusely diseased SVG-rPDA-rPL, with a focal 90% stenosis just before the SVG-rPDA anastomosis.  Graft appearance is similar to prior catheterization in 2017. 4. Chronically occluded SVG-ramus-D1. 5. Mildly elevated left ventricular filling pressure.   Recommendations: 1. Given stable appearance of coronary artery disease and negligible troponin, I do not believe that worsening CAD is driving his dizziness/syncope.  Further evaluation for potential arrhythmic cause is recommended. 2. Continue medical therapy and aggressive secondary prevention. 3. Restart apixaban tomorrow if no evidence of  bleeding/vascular complications.      ASSESSMENT:    1. Coronary artery disease involving coronary bypass graft of native heart without angina pectoris   2. Cardiomyopathy, ischemic   3. History of syncope   4. Paroxysmal atrial fibrillation (HCC)      PLAN:  In order of problems listed above:  CAD status post PCI to the LAD 2016 followed by CABG 06/2014, NSTEMI 2017 with occluded SVG to the RCA treated medically-no angina.  Continue aspirin Coreg and Imdur and Crestor  Combined ischemic/nonischemic cardiomyopathy ejection fraction 35 to 40% on echo 01/06/2019 compensated  Syncope with ischemic cardiomyopathy status post ICD 01/07/2019 no further syncope  PAF status post Maze procedure and LAA clip on Eliquis having trouble affording we will reach out to [facility] to see if he qualifies for assistance  Medication Adjustments/Labs and Tests Ordered: Current medicines are reviewed at length with the patient today.  Concerns regarding medicines are outlined above.  Medication changes, Labs and Tests ordered today are listed in the Patient Instructions below. Patient Instructions  Your physician recommends that you continue on your current medications as directed. Please refer to the Current Medication list given to you today.   Your physician recommends that you schedule a follow-up appointment in: [place], Ermalinda Barrios, PA-C  01/21/2019 10:17 AM    [address], [place], [place]  [address] Phone: [phone]; Fax: [phone]

## 2019-01-18 ENCOUNTER — Other Ambulatory Visit: Payer: Self-pay | Admitting: Primary Care

## 2019-01-18 DIAGNOSIS — Z794 Long term (current) use of insulin: Secondary | ICD-10-CM

## 2019-01-18 DIAGNOSIS — E1159 Type 2 diabetes mellitus with other circulatory complications: Secondary | ICD-10-CM

## 2019-01-20 ENCOUNTER — Telehealth: Payer: Self-pay | Admitting: Physician Assistant

## 2019-01-20 NOTE — Telephone Encounter (Signed)
Per pt call stated he needs to bring his wife into the appointment to hear everything following up on his Procedure and to please give him a call back on a yes or no.

## 2019-01-21 ENCOUNTER — Other Ambulatory Visit: Payer: Self-pay

## 2019-01-21 ENCOUNTER — Ambulatory Visit (INDEPENDENT_AMBULATORY_CARE_PROVIDER_SITE_OTHER): Payer: Medicare Other | Admitting: Physician Assistant

## 2019-01-21 ENCOUNTER — Encounter: Payer: Self-pay | Admitting: Physician Assistant

## 2019-01-21 ENCOUNTER — Ambulatory Visit (INDEPENDENT_AMBULATORY_CARE_PROVIDER_SITE_OTHER): Payer: Medicare Other | Admitting: *Deleted

## 2019-01-21 VITALS — BP 106/60 | HR 68 | Ht 68.0 in | Wt 206.0 lb

## 2019-01-21 DIAGNOSIS — Z87898 Personal history of other specified conditions: Secondary | ICD-10-CM

## 2019-01-21 DIAGNOSIS — I25810 Atherosclerosis of coronary artery bypass graft(s) without angina pectoris: Secondary | ICD-10-CM | POA: Diagnosis not present

## 2019-01-21 DIAGNOSIS — I480 Paroxysmal atrial fibrillation: Secondary | ICD-10-CM

## 2019-01-21 DIAGNOSIS — I472 Ventricular tachycardia: Secondary | ICD-10-CM | POA: Diagnosis not present

## 2019-01-21 DIAGNOSIS — I4729 Other ventricular tachycardia: Secondary | ICD-10-CM

## 2019-01-21 DIAGNOSIS — I255 Ischemic cardiomyopathy: Secondary | ICD-10-CM

## 2019-01-21 NOTE — Patient Instructions (Signed)
Call office if you have any swelling, drainage or redness at wound site.

## 2019-01-21 NOTE — Patient Instructions (Signed)
Your physician recommends that you continue on your current medications as directed. Please refer to the Current Medication list given to you today.   Your physician recommends that you schedule a follow-up appointment in: AS PLANNED

## 2019-01-22 NOTE — Telephone Encounter (Signed)
Call was sent while I was not in the office.  The patient was seen in the office yesterday with his wife present at appointment.

## 2019-01-23 ENCOUNTER — Telehealth: Payer: Self-pay

## 2019-01-23 NOTE — Telephone Encounter (Signed)
**Note De-Identified Alaira Level Obfuscation** I called the pt to discuss and he states that he does not want to change to another medication so he will try to pay his deductible to get the cost of his Eliquis back down to $47/30 day supply.

## 2019-01-23 NOTE — Telephone Encounter (Signed)
**Note De-identified Romina Divirgilio Obfuscation** -----  **Note De-Identified Ruthanne Mcneish Obfuscation** Message from Imogene Burn, PA-C sent at 01/23/2019  9:26 AM EST ----- Jeani Hawking, do you mind calling the patient and explaining this to him? If he can't afford it we'll have to consider changing his medicine. Thanks, Selinda Eon ----- Message ----- From: Dennie Fetters, LPN Sent: 5/79/0383  10:22 AM EST To: Imogene Burn, PA-C  Karie Soda, I think this cost is his deductible and I dont have anyway to help with deductibles. Since the pt has INS coverage he will not be eligible for pt asst until he goes into the donut hole or has no INS at all. Im so sorry, Jeani Hawking ----- Message ----- From: Murrell Converse Sent: 01/21/2019  10:17 AM EST To: Deliah Boston Kaelah Hayashi, LPN  Jeani Hawking, can you see if you can help this patient get eliquis assistance? It's going to cost him $490 for his first Rx

## 2019-01-29 ENCOUNTER — Other Ambulatory Visit: Payer: Self-pay | Admitting: Interventional Cardiology

## 2019-01-30 ENCOUNTER — Other Ambulatory Visit: Payer: Self-pay | Admitting: Primary Care

## 2019-01-30 DIAGNOSIS — N400 Enlarged prostate without lower urinary tract symptoms: Secondary | ICD-10-CM

## 2019-02-06 LAB — CUP PACEART INCLINIC DEVICE CHECK
Battery Remaining Longevity: 136 mo
Battery Voltage: 3.14 V
Brady Statistic RV Percent Paced: 0.01 %
Date Time Interrogation Session: 20210119094000
HighPow Impedance: 67 Ohm
Implantable Lead Implant Date: 20210104190000
Implantable Lead Location: 753860
Implantable Pulse Generator Implant Date: 20210104190000
Lead Channel Impedance Value: 437 Ohm
Lead Channel Impedance Value: 513 Ohm
Lead Channel Pacing Threshold Amplitude: 0.5 V
Lead Channel Pacing Threshold Pulse Width: 0.4 ms
Lead Channel Sensing Intrinsic Amplitude: 11.875 mV
Lead Channel Setting Pacing Amplitude: 3.5 V
Lead Channel Setting Pacing Pulse Width: 0.4 ms
Lead Channel Setting Sensing Sensitivity: 0.3 mV

## 2019-02-06 NOTE — Progress Notes (Signed)
Wound check appointment. Steri-strips removed. Wound without redness or edema. Incision edges approximated, wound well healed. Normal device function. Thresholds, sensing, and impedances consistent with implant measurements. Device programmed at 3.5V for extra safety margin until 3 month visit. Histogram distribution appropriate for patient and level of activity. (7 AMS episodes that appear to be AF with controlled v-rates, + Eliquis. No ventricular arrhythmias noted. Patient educated about wound care, arm mobility, lifting restrictions, shock plan. ROV with Dr Rayann Heman on 04/11/19 and next remote transmission 04/09/19.

## 2019-02-09 ENCOUNTER — Other Ambulatory Visit: Payer: Self-pay | Admitting: Interventional Cardiology

## 2019-02-23 NOTE — Progress Notes (Signed)
Cardiology Office Note   Date:  02/26/2019   ID:  [name], DOB 12/24/1945, MRN [id number]  PCP:  [name], NP    No chief complaint on file.  CAD  Wt Readings from Last 3 Encounters:  02/26/19 220 lb 3.2 oz (99.9 kg)  01/21/19 206 lb (93.4 kg)  01/07/19 204 lb 9.6 oz (92.8 kg)       History of Present Illness: [name] is a 74 y.o. male   who presents today to follow-up coronary disease. He had stents in 5/16 with rotational atherectomy. He then had early restenosis and progression of left sided disease. He had surgery with Dr. Roxan Hockey. He is post-CABG; after having surgery in June 2016. He has some toe numbness intermittently. His surgery was June 22, 2014. In addition he had a maze procedure and a left atrial appendage clip was placed. He remains on anticoagulation. During his initial hospitalization his EF was in the 40-45% range. EF had normalized at 55%.   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.He had palpitations withhis prior atrial fibrillation. He did have left atrial appendage clipping during his open heart surgery.   He had a NSTEMI in Augist 2017. EF by cath was low. EF by echo was 50-55%. SVG to RCAoccludedbut medically managed.   He had syncope in Jan 2021.  EF has decreased to EF 35-40%.  Cath was done: 1. "Severe native coronary artery disease, as detailed below, not significant changed from prior catheterization in 2017. 2. Widely patent LIMA-LAD. 3. Patent but small and diffusely diseased SVG-rPDA-rPL, with a focal 90% stenosis just before the SVG-rPDA anastomosis.  Graft appearance is similar to prior catheterization in 2017. 4. Chronically occluded SVG-ramus-D1. 5. Mildly elevated left ventricular filling pressure.  Recommendations: 1. Given stable appearance of coronary artery disease and negligible troponin, I do not believe that worsening CAD is driving his dizziness/syncope.  Further evaluation for  potential arrhythmic cause is recommended. 2. Continue medical therapy and aggressive secondary prevention. Restart apixaban tomorrow if no evidence of bleeding/vascular complications."  AICD was placed:" Implantation of a Medtronic single chamber ICD on 01/07/2019 by Dr. Rayann Heman.  The patient received a Medtronic model number [id number] ICD with model number [id number] right ventricular lead. DFT's were deferred at time of implant."  Since the hospital stay, he has felt well.  No ICD discharges.  Denies : Chest pain. Dizziness. Leg edema. Nitroglycerin use. Orthopnea. Palpitations. Paroxysmal nocturnal dyspnea. Shortness of breath. Syncope.   Playing golf again after ICD.    Past Medical History:  Diagnosis Date  . Arthritis    "fingers, shoulders" (08/03/2015)  . Atrial fibrillation (HCC)    Paroxysmal, rare episodes, sinus rhythm on flecainide, patient prefers not to take Coumadin  . Bradycardia    April, 2013  . Chest pain    Nuclear, 2006, no ischemia  . Chronic lower back pain    "all my life"  . Coronary artery disease    s/p staged cath 05/12/2014 and 5/11, DES x 2 to heavily calcified RCA, residual with 60% prox LAD, 70% D1  . Ejection fraction    EF 55-60%,  echo, January, 2011  . Elevated CPK    CPK elevated with normal MB and normal troponin the past  . Heart murmur   . History of echocardiogram    Echo 8/16: EF 55%, normal wall motion, grade 2 diastolic dysfunction, trivial MR, normal RV function, PASP 27 mmHg  . Hypertension   .  IBS (irritable bowel syndrome)   . Myocardial infarction ([place]) 06/2014  . Sinus drainage   . Type II diabetes mellitus ([place])     Past Surgical History:  Procedure Laterality Date  . APPENDECTOMY    . CARDIAC CATHETERIZATION N/A 05/12/2014   Procedure: Left Heart Cath and Coronary Angiography;  Surgeon: Troy Sine, MD;  Location: [facility];  Service: Cardiovascular;  Laterality: N/A;  . CARDIAC CATHETERIZATION N/A 05/13/2014    Procedure: Coronary/Graft Atherectomy;  Surgeon: Troy Sine, MD;  Location: [facility];  Service: Cardiovascular;  Laterality: N/A;  . CARDIAC CATHETERIZATION Right 05/13/2014   Procedure: Temporary Pacemaker;  Surgeon: Troy Sine, MD;  Location: [facility];  Service: Cardiovascular;  Laterality: Right;  . CARDIAC CATHETERIZATION N/A 05/13/2014   Procedure: Coronary Stent Intervention;  Surgeon: Troy Sine, MD;  Location: [facility];  Service: Cardiovascular;  Laterality: N/A;  . CARDIAC CATHETERIZATION N/A 06/18/2014   Procedure: Left Heart Cath and Coronary Angiography;  Surgeon: Peter M Martinique, MD;  Location: [facility];  Service: Cardiovascular;  Laterality: N/A;  . CARDIAC CATHETERIZATION N/A 08/03/2015   Procedure: Left Heart Cath and Cors/Grafts Angiography;  Surgeon: Nelva Bush, MD;  Location: [facility];  Service: Cardiovascular;  Laterality: N/A;  . CARDIOVASCULAR STRESS TEST  05/06/2014  . CATARACT EXTRACTION W/ INTRAOCULAR LENS IMPLANT Left   . CORONARY ARTERY BYPASS GRAFT N/A 06/22/2014   Procedure: CORONARY ARTERY BYPASS GRAFTING (CABG) x five, using left internal mammary artery, and right leg greater saphenous vein harvested endoscopically;  Surgeon: Melrose Nakayama, MD;  Location: [facility];  Service: Open Heart Surgery;  Laterality: N/A;  . ICD IMPLANT N/A 01/07/2019   Procedure: ICD IMPLANT;  Surgeon: Thompson Grayer, MD;  Location: [facility];  Service: Cardiovascular;  Laterality: N/A;  . LAPAROSCOPIC CHOLECYSTECTOMY    . LEFT HEART CATH AND CORS/GRAFTS ANGIOGRAPHY N/A 01/06/2019   Procedure: LEFT HEART CATH AND CORS/GRAFTS ANGIOGRAPHY;  Surgeon: Nelva Bush, MD;  Location: [facility];  Service: Cardiovascular;  Laterality: N/A;  . MAZE N/A 06/22/2014   Procedure: MAZE;  Surgeon: Melrose Nakayama, MD;  Location: [facility];  Service: Open Heart Surgery;  Laterality: N/A;  . TEE WITHOUT CARDIOVERSION N/A 06/22/2014     Procedure: TRANSESOPHAGEAL ECHOCARDIOGRAM (TEE);  Surgeon: Melrose Nakayama, MD;  Location: [facility];  Service: Open Heart Surgery;  Laterality: N/A;     Current Outpatient Medications  Medication Sig Dispense Refill  . acetaminophen (TYLENOL) 325 MG tablet Take 1-2 tablets (325-650 mg total) by mouth every 4 (four) hours as needed for mild pain.    Marland Kitchen albuterol (VENTOLIN HFA) 108 (90 Base) MCG/ACT inhaler Inhale 2 puffs into the lungs every 4 (four) hours as needed for wheezing or shortness of breath (cough, shortness of breath or wheezing.). 18 g 0  . apixaban (ELIQUIS) 5 MG TABS tablet Take 1 tablet (5 mg total) by mouth 2 (two) times daily. (restart on Saturday) 60 tablet 5  . aspirin 81 MG tablet Take 81 mg by mouth daily.    . carvedilol (COREG) 6.25 MG tablet TAKE 1 TABLET BY MOUTH TWICE A DAY WITH FOOD 180 tablet 3  . fexofenadine (ALLEGRA) 180 MG tablet TAKE 1 TABLET (180 MG TOTAL) BY MOUTH DAILY. FOR ALLERGIES. 90 tablet 0  . fluorometholone (FML) 0.1 % ophthalmic suspension Place 1 drop into the right eye daily as needed (for eye redness and  itching).     . fluticasone (FLONASE) 50 MCG/ACT nasal spray PLACE 1 SPRAY INTO BOTH NOSTRILS 2 (TWO) TIMES DAILY AS NEEDED FOR ALLERGIES OR RHINITIS. 16 mL 2  . insulin NPH Human (NOVOLIN N RELION) 100 UNIT/ML injection Inject 50 units into the skin every morning and 50 units into the skin in the evening. 30 mL 5  . Insulin Pen Needle 31G X 8 MM MISC Use as directed with Lantus. 100 each 5  . insulin regular (NOVOLIN R) 100 units/mL injection Inject 40 Units into the skin 2 (two) times daily before a meal.    . isosorbide mononitrate (IMDUR) 30 MG 24 hr tablet TAKE 1 TABLET BY MOUTH EVERY DAY 90 tablet 3  . lisinopril (ZESTRIL) 5 MG tablet TAKE 1 TABLET (5 MG TOTAL) DAILY BY MOUTH. 90 tablet 3  . MAGNESIUM PO Take 1 tablet by mouth daily as needed (for leg cramping).     . nitroGLYCERIN (NITROSTAT) 0.4 MG SL tablet Place 1 tablet (0.4 mg total)  under the tongue every 5 (five) minutes as needed for chest pain. 25 tablet 0  . rosuvastatin (CRESTOR) 10 MG tablet TAKE 1 TABLET BY MOUTH EVERY DAY 90 tablet 3  . tamsulosin (FLOMAX) 0.4 MG CAPS capsule TAKE 1 CAPSULE (0.4 MG TOTAL) BY MOUTH AS NEEDED. FOR DIFFICULTY URINATING. 90 capsule 1   No current facility-administered medications for this visit.    Allergies:   Lipitor [atorvastatin], Pravastatin, and Simvastatin    Social History:  The patient  reports that he has never smoked. He has never used smokeless tobacco. He reports that he does not drink alcohol or use drugs.   Family History:  The patient's family history includes Alzheimer's disease in his mother; Arrhythmia in his sister; Cancer in his brother; Emphysema in his father; Heart attack in his sister; Heart disease in his sister; Hyperlipidemia in his sister; Hypertension in his sister.    ROS:  Please see the history of present illness.   Otherwise, review of systems are positive for increasing activity.   All other systems are reviewed and negative.    PHYSICAL EXAM: VS:  BP (!) 158/82   Pulse 71   Ht 5' 8"  (1.727 m)   Wt 220 lb 3.2 oz (99.9 kg)   SpO2 97%   BMI 33.48 kg/m  , BMI Body mass index is 33.48 kg/m. GEN: Well nourished, well developed, in no acute distress  HEENT: normal  Neck: no JVD, carotid bruits, or masses Cardiac: RRR; no murmurs, rubs, or gallops,no edema  Respiratory:  clear to auscultation bilaterally, normal work of breathing GI: soft, nontender, nondistended, + BS MS: no deformity or atrophy - well healed ICD incision Skin: warm and dry, no rash Neuro:  Strength and sensation are intact Psych: euthymic mood, full affect   EKG:   The ekg ordered 01/2019 demonstrates sinus bradycardia, nonspecific ST T wave chages   Recent Labs: 01/06/2019: TSH 1.529 01/07/2019: ALT 46; BUN 12; Creatinine, Ser 1.10; Hemoglobin 12.9; Platelets 263; Potassium 3.9; Sodium 139   Lipid Panel    Component  Value Date/Time   CHOL 117 01/06/2019 0151   TRIG 112 01/06/2019 0151   HDL 29 (L) 01/06/2019 0151   CHOLHDL 4.0 01/06/2019 0151   VLDL 22 01/06/2019 0151   LDLCALC 66 01/06/2019 0151     Other studies Reviewed: Additional studies/ records that were reviewed today with results demonstrating: labs reviewed, hospital records reviewed.   ASSESSMENT AND PLAN:  1.  CAD: No angina.  COntinue aggressive secondary prevention.  Stop aspirin at this time to prevent bleeding problems. COntinue Eliquis.  2.   Chronic systolic heart failure/HTN: BP controlled at home.  Appears euvolemic.  Systolics at home in the 396-886 range.  3.   AICD placed by Dr. Rayann Heman.  Incision well healed. 4.   Hyperlipidemia: LDL 66 in 1/21. Continue Crestor.  5.   Type 2 diabetes: A1C 9.2.  Working on this.  Trying to improve diet.     Current medicines are reviewed at length with the patient today.  The patient concerns regarding his medicines were addressed.  The following changes have been made:  Stop aspirin  Labs/ tests ordered today include:  No orders of the defined types were placed in this encounter.   Recommend 150 minutes/week of aerobic exercise Low fat, low carb, high fiber diet recommended  Disposition:   FU in 6 months   Signed, Larae Grooms, MD  02/26/2019 9:10 AM    [facility] [address], [place], [place]  [address] Phone: [phone]; Fax: [phone]

## 2019-02-26 ENCOUNTER — Encounter: Payer: Self-pay | Admitting: Interventional Cardiology

## 2019-02-26 ENCOUNTER — Other Ambulatory Visit: Payer: Self-pay

## 2019-02-26 ENCOUNTER — Ambulatory Visit (INDEPENDENT_AMBULATORY_CARE_PROVIDER_SITE_OTHER): Payer: Medicare Other | Admitting: Interventional Cardiology

## 2019-02-26 VITALS — BP 158/82 | HR 71 | Ht 68.0 in | Wt 220.2 lb

## 2019-02-26 DIAGNOSIS — I255 Ischemic cardiomyopathy: Secondary | ICD-10-CM | POA: Diagnosis not present

## 2019-02-26 DIAGNOSIS — I25810 Atherosclerosis of coronary artery bypass graft(s) without angina pectoris: Secondary | ICD-10-CM | POA: Diagnosis not present

## 2019-02-26 DIAGNOSIS — E782 Mixed hyperlipidemia: Secondary | ICD-10-CM | POA: Diagnosis not present

## 2019-02-26 DIAGNOSIS — Z87898 Personal history of other specified conditions: Secondary | ICD-10-CM | POA: Diagnosis not present

## 2019-02-26 DIAGNOSIS — E1159 Type 2 diabetes mellitus with other circulatory complications: Secondary | ICD-10-CM

## 2019-02-26 DIAGNOSIS — I5022 Chronic systolic (congestive) heart failure: Secondary | ICD-10-CM | POA: Diagnosis not present

## 2019-02-26 NOTE — Patient Instructions (Signed)
Medication Instructions:  Your physician has recommended you make the following change in your medication:   STOP: aspirin  *If you need a refill on your cardiac medications before your next appointment, please call your pharmacy*  Lab Work: None ordered  If you have labs (blood work) drawn today and your tests are completely normal, you will receive your results only by: Marland Kitchen MyChart Message (if you have MyChart) OR . A paper copy in the mail If you have any lab test that is abnormal or we need to change your treatment, we will call you to review the results.  Testing/Procedures: None ordered  Follow-Up: At [facility], you and your health needs are our priority.  As part of our continuing mission to provide you with exceptional heart care, we have created designated Provider Care Teams.  These Care Teams include your primary Cardiologist (physician) and Advanced Practice Providers (APPs -  Physician Assistants and Nurse Practitioners) who all work together to provide you with the care you need, when you need it.  Your next appointment:   6 month(s)  The format for your next appointment:   In Person  Provider:   You may see Larae Grooms, MD or one of the following Advanced Practice Providers on your designated Care Team:    Melina Copa, PA-C  Ermalinda Barrios, PA-C   Other Instructions   High-Fiber Diet Fiber, also called dietary fiber, is a type of carbohydrate that is found in fruits, vegetables, whole grains, and beans. A high-fiber diet can have many health benefits. Your health care provider may recommend a high-fiber diet to help:  Prevent constipation. Fiber can make your bowel movements more regular.  Lower your cholesterol.  Relieve the following conditions: ? Swelling of veins in the anus (hemorrhoids). ? Swelling and irritation (inflammation) of specific areas of the digestive tract (uncomplicated diverticulosis). ? A problem of the large intestine (colon)  that sometimes causes pain and diarrhea (irritable bowel syndrome, IBS).  Prevent overeating as part of a weight-loss plan.  Prevent heart disease, type 2 diabetes, and certain cancers. What is my plan? The recommended daily fiber intake in grams (g) includes:  38 g for men age 83 or younger.  30 g for men over age 77.  51 g for women age 28 or younger.  21 g for women over age 58. You can get the recommended daily intake of dietary fiber by:  Eating a variety of fruits, vegetables, grains, and beans.  Taking a fiber supplement, if it is not possible to get enough fiber through your diet. What do I need to know about a high-fiber diet?  It is better to get fiber through food sources rather than from fiber supplements. There is not a lot of research about how effective supplements are.  Always check the fiber content on the nutrition facts label of any prepackaged food. Look for foods that contain 5 g of fiber or more per serving.  Talk with a diet and nutrition specialist (dietitian) if you have questions about specific foods that are recommended or not recommended for your medical condition, especially if those foods are not listed below.  Gradually increase how much fiber you consume. If you increase your intake of dietary fiber too quickly, you may have bloating, cramping, or gas.  Drink plenty of water. Water helps you to digest fiber. What are tips for following this plan?  Eat a wide variety of high-fiber foods.  Make sure that half of the grains  that you eat each day are whole grains.  Eat breads and cereals that are made with whole-grain flour instead of refined flour or white flour.  Eat brown rice, bulgur wheat, or millet instead of white rice.  Start the day with a breakfast that is high in fiber, such as a cereal that contains 5 g of fiber or more per serving.  Use beans in place of meat in soups, salads, and pasta dishes.  Eat high-fiber snacks, such as  berries, raw vegetables, nuts, and popcorn.  Choose whole fruits and vegetables instead of processed forms like juice or sauce. What foods can I eat?  Fruits Berries. Pears. Apples. Oranges. Avocado. Prunes and raisins. Dried figs. Vegetables Sweet potatoes. Spinach. Kale. Artichokes. Cabbage. Broccoli. Cauliflower. Green peas. Carrots. Squash. Grains Whole-grain breads. Multigrain cereal. Oats and oatmeal. Brown rice. Barley. Bulgur wheat. Mann[place]. Quinoa. Bran muffins. Popcorn. Rye wafer crackers. Meats and other proteins Navy, kidney, and pinto beans. Soybeans. Split peas. Lentils. Nuts and seeds. Dairy Fiber-fortified yogurt. Beverages Fiber-fortified soy milk. Fiber-fortified orange juice. Other foods Fiber bars. The items listed above may not be a complete list of recommended foods and beverages. Contact a dietitian for more options. What foods are not recommended? Fruits Fruit juice. Cooked, strained fruit. Vegetables Fried potatoes. Canned vegetables. Well-cooked vegetables. Grains White bread. Pasta made with refined flour. White rice. Meats and other proteins Fatty cuts of meat. Fried chicken or fried fish. Dairy Milk. Yogurt. Cream cheese. Sour cream. Fats and oils Butters. Beverages Soft drinks. Other foods Cakes and pastries. The items listed above may not be a complete list of foods and beverages to avoid. Contact a dietitian for more information. Summary  Fiber is a type of carbohydrate. It is found in fruits, vegetables, whole grains, and beans.  There are many health benefits of eating a high-fiber diet, such as preventing constipation, lowering blood cholesterol, helping with weight loss, and reducing your risk of heart disease, diabetes, and certain cancers.  Gradually increase your intake of fiber. Increasing too fast can result in cramping, bloating, and gas. Drink plenty of water while you increase your fiber.  The best sources of fiber include whole  fruits and vegetables, whole grains, nuts, seeds, and beans. This information is not intended to replace advice given to you by your health care provider. Make sure you discuss any questions you have with your health care provider. Document Revised: 10/23/2016 Document Reviewed: 10/23/2016 Elsevier Patient Education  2020 [employer].

## 2019-02-28 ENCOUNTER — Ambulatory Visit: Payer: Medicare Other | Attending: Internal Medicine

## 2019-02-28 DIAGNOSIS — Z23 Encounter for immunization: Secondary | ICD-10-CM | POA: Insufficient documentation

## 2019-02-28 NOTE — Progress Notes (Signed)
   Covid-19 Vaccination Clinic  Name:  [name]    MRN: [id number] DOB: 03/22/1945  02/28/2019  [name] was observed post Covid-19 immunization for 15 minutes without incidence. He was provided with Vaccine Information Sheet and instruction to access the V-Safe system.   [name] was instructed to call 911 with any severe reactions post vaccine: Marland Kitchen Difficulty breathing  . Swelling of your face and throat  . A fast heartbeat  . A bad rash all over your body  . Dizziness and weakness    Immunizations Administered    Name Date Dose VIS Date Route   Pfizer COVID-19 Vaccine 02/28/2019 10:01 AM 0.3 mL 12/13/2018 Intramuscular   Manufacturer: [place]   Lot: [id number]   [place]: [id number]

## 2019-03-20 ENCOUNTER — Ambulatory Visit: Payer: Medicare Other | Admitting: Primary Care

## 2019-03-26 ENCOUNTER — Ambulatory Visit: Payer: Medicare Other | Attending: Internal Medicine

## 2019-03-26 DIAGNOSIS — Z23 Encounter for immunization: Secondary | ICD-10-CM

## 2019-03-26 NOTE — Progress Notes (Signed)
   Covid-19 Vaccination Clinic  Name:  [name]    MRN: [id number] DOB: April 25, 1945  03/26/2019  [name] was observed post Covid-19 immunization for 15 minutes without incident. He was provided with Vaccine Information Sheet and instruction to access the V-Safe system.   [name] was instructed to call 911 with any severe reactions post vaccine: Marland Kitchen Difficulty breathing  . Swelling of face and throat  . A fast heartbeat  . A bad rash all over body  . Dizziness and weakness   Immunizations Administered    Name Date Dose VIS Date Route   Pfizer COVID-19 Vaccine 03/26/2019  1:03 PM 0.3 mL 12/13/2018 Intramuscular   Manufacturer: [place]   Lot: [id number]   [place]: [id number]

## 2019-04-09 ENCOUNTER — Ambulatory Visit (INDEPENDENT_AMBULATORY_CARE_PROVIDER_SITE_OTHER): Payer: Medicare Other | Admitting: *Deleted

## 2019-04-09 DIAGNOSIS — I255 Ischemic cardiomyopathy: Secondary | ICD-10-CM | POA: Diagnosis not present

## 2019-04-09 LAB — CUP PACEART REMOTE DEVICE CHECK
Battery Remaining Longevity: 136 mo
Battery Voltage: 3.12 V
Brady Statistic RV Percent Paced: 0.01 %
Date Time Interrogation Session: 20210407012304
HighPow Impedance: 76 Ohm
Implantable Lead Implant Date: 20210105
Implantable Lead Location: 753860
Implantable Pulse Generator Implant Date: 20210105
Lead Channel Impedance Value: 380 Ohm
Lead Channel Impedance Value: 456 Ohm
Lead Channel Pacing Threshold Amplitude: 0.375 V
Lead Channel Pacing Threshold Pulse Width: 0.4 ms
Lead Channel Sensing Intrinsic Amplitude: 10.5 mV
Lead Channel Sensing Intrinsic Amplitude: 10.5 mV
Lead Channel Setting Pacing Amplitude: 3.25 V
Lead Channel Setting Pacing Pulse Width: 0.4 ms
Lead Channel Setting Sensing Sensitivity: 0.3 mV

## 2019-04-09 NOTE — Progress Notes (Signed)
ICD Remote  

## 2019-04-11 ENCOUNTER — Ambulatory Visit (INDEPENDENT_AMBULATORY_CARE_PROVIDER_SITE_OTHER): Payer: Medicare Other | Admitting: Internal Medicine

## 2019-04-11 ENCOUNTER — Other Ambulatory Visit: Payer: Self-pay

## 2019-04-11 ENCOUNTER — Encounter: Payer: Self-pay | Admitting: Internal Medicine

## 2019-04-11 ENCOUNTER — Encounter: Payer: Medicare Other | Admitting: Internal Medicine

## 2019-04-11 VITALS — BP 154/80 | HR 73 | Ht 68.0 in | Wt 217.0 lb

## 2019-04-11 DIAGNOSIS — R55 Syncope and collapse: Secondary | ICD-10-CM

## 2019-04-11 DIAGNOSIS — Z95810 Presence of automatic (implantable) cardiac defibrillator: Secondary | ICD-10-CM | POA: Insufficient documentation

## 2019-04-11 DIAGNOSIS — I480 Paroxysmal atrial fibrillation: Secondary | ICD-10-CM | POA: Diagnosis not present

## 2019-04-11 DIAGNOSIS — I255 Ischemic cardiomyopathy: Secondary | ICD-10-CM | POA: Diagnosis not present

## 2019-04-11 LAB — CUP PACEART INCLINIC DEVICE CHECK
Battery Remaining Longevity: 135 mo
Battery Voltage: 3.11 V
Brady Statistic RV Percent Paced: 0.01 %
Date Time Interrogation Session: 20210409072100
HighPow Impedance: 75 Ohm
Implantable Lead Implant Date: 20210105
Implantable Lead Location: 753860
Implantable Pulse Generator Implant Date: 20210105
Lead Channel Impedance Value: 399 Ohm
Lead Channel Impedance Value: 494 Ohm
Lead Channel Pacing Threshold Amplitude: 0.375 V
Lead Channel Pacing Threshold Pulse Width: 0.4 ms
Lead Channel Sensing Intrinsic Amplitude: 11 mV
Lead Channel Setting Pacing Amplitude: 2.5 V
Lead Channel Setting Pacing Pulse Width: 0.4 ms
Lead Channel Setting Sensing Sensitivity: 0.3 mV

## 2019-04-11 NOTE — Progress Notes (Signed)
PCP: [name], NP Primary Cardiologist: Dr Irish Lack Primary EP: Dr Verdis Frederickson is a 74 y.o. male who presents today for routine electrophysiology followup.  Since his ICD implant, the patient reports doing very well.  Today, he denies symptoms of palpitations, chest pain, shortness of breath,  lower extremity edema, dizziness, presyncope, syncope, or ICD shocks.  The patient is otherwise without complaint today.   Past Medical History:  Diagnosis Date  . Arthritis    "fingers, shoulders" (08/03/2015)  . Atrial fibrillation (HCC)    Paroxysmal, rare episodes, sinus rhythm on flecainide, patient prefers not to take Coumadin  . Bradycardia    April, 2013  . Chest pain    Nuclear, 2006, no ischemia  . Chronic lower back pain    "all my life"  . Coronary artery disease    s/p staged cath 05/12/2014 and 5/11, DES x 2 to heavily calcified RCA, residual with 60% prox LAD, 70% D1  . Ejection fraction    EF 55-60%,  echo, January, 2011  . Elevated CPK    CPK elevated with normal MB and normal troponin the past  . Heart murmur   . History of echocardiogram    Echo 8/16: EF 55%, normal wall motion, grade 2 diastolic dysfunction, trivial MR, normal RV function, PASP 27 mmHg  . Hypertension   . IBS (irritable bowel syndrome)   . Myocardial infarction ([place]) 06/2014  . Sinus drainage   . Type II diabetes mellitus ([place])    Past Surgical History:  Procedure Laterality Date  . APPENDECTOMY    . CARDIAC CATHETERIZATION N/A 05/12/2014   Procedure: Left Heart Cath and Coronary Angiography;  Surgeon: Troy Sine, MD;  Location: [facility];  Service: Cardiovascular;  Laterality: N/A;  . CARDIAC CATHETERIZATION N/A 05/13/2014   Procedure: Coronary/Graft Atherectomy;  Surgeon: Troy Sine, MD;  Location: [facility];  Service: Cardiovascular;  Laterality: N/A;  . CARDIAC CATHETERIZATION Right 05/13/2014   Procedure: Temporary Pacemaker;  Surgeon: Troy Sine,  MD;  Location: [facility];  Service: Cardiovascular;  Laterality: Right;  . CARDIAC CATHETERIZATION N/A 05/13/2014   Procedure: Coronary Stent Intervention;  Surgeon: Troy Sine, MD;  Location: [facility];  Service: Cardiovascular;  Laterality: N/A;  . CARDIAC CATHETERIZATION N/A 06/18/2014   Procedure: Left Heart Cath and Coronary Angiography;  Surgeon: Peter M Martinique, MD;  Location: [facility];  Service: Cardiovascular;  Laterality: N/A;  . CARDIAC CATHETERIZATION N/A 08/03/2015   Procedure: Left Heart Cath and Cors/Grafts Angiography;  Surgeon: Nelva Bush, MD;  Location: [facility];  Service: Cardiovascular;  Laterality: N/A;  . CARDIOVASCULAR STRESS TEST  05/06/2014  . CATARACT EXTRACTION W/ INTRAOCULAR LENS IMPLANT Left   . CORONARY ARTERY BYPASS GRAFT N/A 06/22/2014   Procedure: CORONARY ARTERY BYPASS GRAFTING (CABG) x five, using left internal mammary artery, and right leg greater saphenous vein harvested endoscopically;  Surgeon: Melrose Nakayama, MD;  Location: [facility];  Service: Open Heart Surgery;  Laterality: N/A;  . ICD IMPLANT N/A 01/07/2019   Procedure: ICD IMPLANT;  Surgeon: Thompson Grayer, MD;  Location: [facility];  Service: Cardiovascular;  Laterality: N/A;  . LAPAROSCOPIC CHOLECYSTECTOMY    . LEFT HEART CATH AND CORS/GRAFTS ANGIOGRAPHY N/A 01/06/2019   Procedure: LEFT HEART CATH AND CORS/GRAFTS ANGIOGRAPHY;  Surgeon: Nelva Bush, MD;  Location: [facility];  Service: Cardiovascular;  Laterality: N/A;  . MAZE  N/A 06/22/2014   Procedure: MAZE;  Surgeon: Melrose Nakayama, MD;  Location: [facility];  Service: Open Heart Surgery;  Laterality: N/A;  . TEE WITHOUT CARDIOVERSION N/A 06/22/2014   Procedure: TRANSESOPHAGEAL ECHOCARDIOGRAM (TEE);  Surgeon: Melrose Nakayama, MD;  Location: [facility];  Service: Open Heart Surgery;  Laterality: N/A;    ROS- all systems are reviewed and negative except as per HPI above  Current Outpatient  Medications  Medication Sig Dispense Refill  . acetaminophen (TYLENOL) 325 MG tablet Take 1-2 tablets (325-650 mg total) by mouth every 4 (four) hours as needed for mild pain.    Marland Kitchen albuterol (VENTOLIN HFA) 108 (90 Base) MCG/ACT inhaler Inhale 2 puffs into the lungs every 4 (four) hours as needed for wheezing or shortness of breath (cough, shortness of breath or wheezing.). 18 g 0  . apixaban (ELIQUIS) 5 MG TABS tablet Take 1 tablet (5 mg total) by mouth 2 (two) times daily. (restart on Saturday) 60 tablet 5  . carvedilol (COREG) 6.25 MG tablet TAKE 1 TABLET BY MOUTH TWICE A DAY WITH FOOD 180 tablet 3  . fexofenadine (ALLEGRA) 180 MG tablet TAKE 1 TABLET (180 MG TOTAL) BY MOUTH DAILY. FOR ALLERGIES. 90 tablet 0  . fluorometholone (FML) 0.1 % ophthalmic suspension Place 1 drop into the right eye daily as needed (for eye redness and itching).     . fluticasone (FLONASE) 50 MCG/ACT nasal spray PLACE 1 SPRAY INTO BOTH NOSTRILS 2 (TWO) TIMES DAILY AS NEEDED FOR ALLERGIES OR RHINITIS. 16 mL 2  . insulin NPH Human (NOVOLIN N RELION) 100 UNIT/ML injection Inject 50 units into the skin every morning and 50 units into the skin in the evening. 30 mL 5  . Insulin Pen Needle 31G X 8 MM MISC Use as directed with Lantus. 100 each 5  . insulin regular (NOVOLIN R) 100 units/mL injection Inject 40 Units into the skin 2 (two) times daily before a meal.    . isosorbide mononitrate (IMDUR) 30 MG 24 hr tablet TAKE 1 TABLET BY MOUTH EVERY DAY 90 tablet 3  . lisinopril (ZESTRIL) 5 MG tablet TAKE 1 TABLET (5 MG TOTAL) DAILY BY MOUTH. 90 tablet 3  . MAGNESIUM PO Take 1 tablet by mouth daily as needed (for leg cramping).     . nitroGLYCERIN (NITROSTAT) 0.4 MG SL tablet Place 1 tablet (0.4 mg total) under the tongue every 5 (five) minutes as needed for chest pain. 25 tablet 0  . rosuvastatin (CRESTOR) 10 MG tablet TAKE 1 TABLET BY MOUTH EVERY DAY 90 tablet 3  . tamsulosin (FLOMAX) 0.4 MG CAPS capsule TAKE 1 CAPSULE (0.4 MG  TOTAL) BY MOUTH AS NEEDED. FOR DIFFICULTY URINATING. 90 capsule 1   No current facility-administered medications for this visit.    Physical Exam: Vitals:   04/11/19 0817  BP: (!) 154/80  Pulse: 73  SpO2: 93%  Weight: 217 lb (98.4 kg)  Height: 5' 8"  (1.727 m)    GEN- The patient is well appearing, alert and oriented x 3 today.   Head- normocephalic, atraumatic Eyes-  Sclera clear, conjunctiva pink Ears- hearing intact Oropharynx- clear Lungs- Clear to ausculation bilaterally, normal work of breathing Chest- ICD pocket is well healed Heart- Regular rate and rhythm, no murmurs, rubs or gallops, PMI not laterally displaced GI- soft, NT, ND, + BS Extremities- no clubbing, cyanosis, or edema  ICD interrogation- reviewed in detail today,  See PACEART report  ekg tracing ordered today is personally reviewed and shows sinus  Wt Readings from Last 3 Encounters:  04/11/19 217 lb (98.4 kg)  02/26/19 220 lb 3.2 oz (99.9 kg)  01/21/19 206 lb (93.4 kg)    Assessment and Plan:  1.  Chronic systolic dysfunction/ mixed ischemic/ nonischemic CM/ NSVT/ syncope/ CAD s/p CABG euvolemic today No ischemic symptoms Stable on an appropriate medical regimen No further syncope Normal ICD function See [facility] report No changes today he is not device dependant today Will offer enrollment in ICM device clinic  2. afib S/p MAZE and LAA clip Very low afib burden (1.1%) On eliquis  3. HL Continue crestor  Thompson Grayer MD, [facility] 04/11/2019 8:44 AM

## 2019-04-11 NOTE — Progress Notes (Deleted)
ICD check in clinic. Normal device function. Thresholds and sensing consistent with previous device measurements. Impedance trends stable over time. 1.1% AT/AF burden, + Eliquis. No ventricular arrhythmias. Histogram distribution appropriate for patient and level of activity. Outputs decreased to 2.5V for safety margin. Device programmed at appropriate safety margins. Device programmed to optimize intrinsic conduction. Estimated longevity 11.3 years. Pt enrolled in remote follow-up 07/09/19. Patient education completed. ROV w/ Dr. Rayann Heman in 1 year.

## 2019-04-11 NOTE — Patient Instructions (Addendum)
Medication Instructions:  Your physician recommends that you continue on your current medications as directed. Please refer to the Current Medication list given to you today.  Labwork: None ordered.  Testing/Procedures: None ordered.  Follow-Up: Your physician wants you to follow-up in: one year with Dr. Rayann Heman.   You will receive a reminder letter in the mail two months in advance. If you don't receive a letter, please call our office to schedule the follow-up appointment.  Remote monitoring is used to monitor your ICD from home. This monitoring reduces the number of office visits required to check your device to one time per year. It allows [place] to keep an eye on the functioning of your device to ensure it is working properly. You are scheduled for a device check from home on 07/09/2019. You may send your transmission at any time that day. If you have a wireless device, the transmission will be sent automatically. After your physician reviews your transmission, you will receive a postcard with your next transmission date.  Any Other Special Instructions Will Be Listed Below (If Applicable).  You are being referred to the [place] with Sharman Cheek.  She will contact you to get you enrolled.  If you need a refill on your cardiac medications before your next appointment, please call your pharmacy.

## 2019-04-14 ENCOUNTER — Ambulatory Visit: Payer: Medicare Other | Admitting: Primary Care

## 2019-04-16 ENCOUNTER — Telehealth: Payer: Self-pay

## 2019-04-16 NOTE — Telephone Encounter (Signed)
Patient referred to [facility] clinic by Dr Rayann Heman at 04/11/2019 office visit.  Attempted ICM intro call to patient and wife stated he was playing golf today.  Advised I will try back another day.

## 2019-04-21 ENCOUNTER — Other Ambulatory Visit: Payer: Self-pay | Admitting: Interventional Cardiology

## 2019-04-21 ENCOUNTER — Other Ambulatory Visit: Payer: Self-pay | Admitting: Primary Care

## 2019-04-21 DIAGNOSIS — J309 Allergic rhinitis, unspecified: Secondary | ICD-10-CM

## 2019-04-23 NOTE — Telephone Encounter (Signed)
Spoke with patient and ICM intro given.  Patient agreed to [facility] monthly follow up for 2-3 months and then evaluate at if should continue monthly or every 3 months.  Discussed limiting salt and fluid intake.  He normally drinks >64 oz daily but will decrease amount and try to stay within that limit.   He feels fine at this time and has not complaints.  1st ICM remote transmission scheduled for 05/20/2019.

## 2019-04-29 ENCOUNTER — Telehealth: Payer: Self-pay | Admitting: Primary Care

## 2019-04-29 NOTE — Progress Notes (Signed)
  Chronic Care Management   Note  04/29/2019 Name: [name] MRN: [id number] DOB: [birth year]  [name] is a 74 y.o. year old male who is a primary care patient of [name], NP. I reached out to [name] by phone today in response to a referral sent by [name]'s PCP, [name], NP.   [name] was given information about Chronic Care Management services today including:  1. CCM service includes personalized support from designated clinical staff supervised by his physician, including individualized plan of care and coordination with other care providers 2. 24/7 contact phone numbers for assistance for urgent and routine care needs. 3. Service will only be billed when office clinical staff spend 20 minutes or more in a month to coordinate care. 4. Only one practitioner may furnish and bill the service in a calendar month. 5. The patient may stop CCM services at any time (effective at the end of the month) by phone call to the office staff.   Patient agreed to services and verbal consent obtained.    This note is not being shared with the patient for the following reason: To respect privacy (The patient or proxy has requested that the information not be shared).  Follow up plan:   Raynicia Dukes [employer] Scheduler

## 2019-04-30 ENCOUNTER — Other Ambulatory Visit: Payer: Self-pay | Admitting: Primary Care

## 2019-04-30 DIAGNOSIS — I25810 Atherosclerosis of coronary artery bypass graft(s) without angina pectoris: Secondary | ICD-10-CM

## 2019-04-30 NOTE — Telephone Encounter (Signed)
Noted, refill provided 

## 2019-04-30 NOTE — Telephone Encounter (Signed)
LOV 12/26/2018 for acute issue Next appointment on 05/19/19.  Last filled on 09/17/2018 #25 with 0 refill

## 2019-05-19 ENCOUNTER — Ambulatory Visit: Payer: Medicare Other | Admitting: Primary Care

## 2019-05-20 ENCOUNTER — Telehealth: Payer: Self-pay

## 2019-05-20 ENCOUNTER — Ambulatory Visit: Payer: Medicare Other

## 2019-05-20 DIAGNOSIS — I5022 Chronic systolic (congestive) heart failure: Secondary | ICD-10-CM

## 2019-05-20 DIAGNOSIS — Z95810 Presence of automatic (implantable) cardiac defibrillator: Secondary | ICD-10-CM

## 2019-05-20 NOTE — Telephone Encounter (Signed)
Remote ICM transmission received.  Attempted call to patient regarding ICM remote transmission and received high pitch noise after person answered phone.  Attempted 2nd call back and went to voice mail.  Also tried cell number and went straight to voice mail.

## 2019-05-20 NOTE — Progress Notes (Signed)
EPIC Encounter for ICM Monitoring  Patient Name: [name] is a 74 y.o. male Date: 05/20/2019 Primary Care Physican: [name], NP Primary Cardiologist: Irish Lack Electrophysiologist: Allred Weight:  unknown       AF               1 Episode Time in AF  <0.1 hr/day (<0.1%)  1st ICM remote transmission.  Attempted call to patient and unable to reach.   Transmission reviewed.      Opivol thoracic impedance normal but was suggesting possible fluid accumulation from 04/26/19 - 05/04/19.   No diuretic prescribed  Recommendations: Unable to reach.    Follow-up plan: ICM clinic phone appointment on 06/23/2019.  91 day device clinic remote transmission 07/09/2019.     Copy of ICM check sent to Dr. Rayann Heman.   3 month ICM trend: 05/19/2019    1 Year ICM trend:       [name], RN 05/20/2019 5:12 PM

## 2019-05-28 ENCOUNTER — Other Ambulatory Visit: Payer: Self-pay | Admitting: Primary Care

## 2019-05-28 DIAGNOSIS — I25810 Atherosclerosis of coronary artery bypass graft(s) without angina pectoris: Secondary | ICD-10-CM

## 2019-05-29 ENCOUNTER — Telehealth: Payer: Medicare Other

## 2019-06-18 ENCOUNTER — Ambulatory Visit (INDEPENDENT_AMBULATORY_CARE_PROVIDER_SITE_OTHER): Payer: Medicare Other | Admitting: Primary Care

## 2019-06-18 ENCOUNTER — Encounter: Payer: Self-pay | Admitting: Primary Care

## 2019-06-18 ENCOUNTER — Other Ambulatory Visit: Payer: Self-pay

## 2019-06-18 VITALS — BP 150/80 | HR 74 | Temp 95.4°F | Ht 68.0 in | Wt 212.2 lb

## 2019-06-18 DIAGNOSIS — I255 Ischemic cardiomyopathy: Secondary | ICD-10-CM | POA: Diagnosis not present

## 2019-06-18 DIAGNOSIS — E1159 Type 2 diabetes mellitus with other circulatory complications: Secondary | ICD-10-CM

## 2019-06-18 DIAGNOSIS — Z794 Long term (current) use of insulin: Secondary | ICD-10-CM | POA: Diagnosis not present

## 2019-06-18 LAB — POCT GLYCOSYLATED HEMOGLOBIN (HGB A1C): Hemoglobin A1C: 10.1 % — AB (ref 4.0–5.6)

## 2019-06-18 NOTE — Patient Instructions (Addendum)
Continue Novolin NPH 50 units twice daily for diabetes.  Add back Novolin R and inject 10 units three times daily before meals.  Check  your blood sugar levels three times daily. Appropriate times to check your blood sugar levels are:  -Before any meal (breakfast, lunch, dinner) -Two hours after any meal (breakfast, lunch, dinner) -Bedtime  Record your readings and notify me if you continue to consistently run at or above 150.  Please schedule a follow up visit for 6 weeks for diabetes check. Bring your glucose logs.  It was a pleasure to see you today!   Diabetes Mellitus and Nutrition, Adult When you have diabetes (diabetes mellitus), it is very important to have healthy eating habits because your blood sugar (glucose) levels are greatly affected by what you eat and drink. Eating healthy foods in the appropriate amounts, at about the same times every day, can help you:  Control your blood glucose.  Lower your risk of heart disease.  Improve your blood pressure.  Reach or maintain a healthy weight. Every person with diabetes is different, and each person has different needs for a meal plan. Your health care provider may recommend that you work with a diet and nutrition specialist (dietitian) to make a meal plan that is best for you. Your meal plan may vary depending on factors such as:  The calories you need.  The medicines you take.  Your weight.  Your blood glucose, blood pressure, and cholesterol levels.  Your activity level.  Other health conditions you have, such as heart or kidney disease. How do carbohydrates affect me? Carbohydrates, also called carbs, affect your blood glucose level more than any other type of food. Eating carbs naturally raises the amount of glucose in your blood. Carb counting is a method for keeping track of how many carbs you eat. Counting carbs is important to keep your blood glucose at a healthy level, especially if you use insulin or take  certain oral diabetes medicines. It is important to know how many carbs you can safely have in each meal. This is different for every person. Your dietitian can help you calculate how many carbs you should have at each meal and for each snack. Foods that contain carbs include:  Bread, cereal, rice, pasta, and crackers.  Potatoes and corn.  Peas, beans, and lentils.  Milk and yogurt.  Fruit and juice.  Desserts, such as cakes, cookies, ice cream, and candy. How does alcohol affect me? Alcohol can cause a sudden decrease in blood glucose (hypoglycemia), especially if you use insulin or take certain oral diabetes medicines. Hypoglycemia can be a life-threatening condition. Symptoms of hypoglycemia (sleepiness, dizziness, and confusion) are similar to symptoms of having too much alcohol. If your health care provider says that alcohol is safe for you, follow these guidelines:  Limit alcohol intake to no more than 1 drink per day for nonpregnant women and 2 drinks per day for men. One drink equals 12 oz of beer, 5 oz of wine, or 1 oz of hard liquor.  Do not drink on an empty stomach.  Keep yourself hydrated with water, diet soda, or unsweetened iced tea.  Keep in mind that regular soda, juice, and other mixers may contain a lot of sugar and must be counted as carbs. What are tips for following this plan?  Reading food labels  Start by checking the serving size on the "Nutrition Facts" label of packaged foods and drinks. The amount of calories, carbs, fats, and other  nutrients listed on the label is based on one serving of the item. Many items contain more than one serving per package.  Check the total grams (g) of carbs in one serving. You can calculate the number of servings of carbs in one serving by dividing the total carbs by 15. For example, if a food has 30 g of total carbs, it would be equal to 2 servings of carbs.  Check the number of grams (g) of saturated and trans fats in one  serving. Choose foods that have low or no amount of these fats.  Check the number of milligrams (mg) of salt (sodium) in one serving. Most people should limit total sodium intake to less than 2,300 mg per day.  Always check the nutrition information of foods labeled as "low-fat" or "nonfat". These foods may be higher in added sugar or refined carbs and should be avoided.  Talk to your dietitian to identify your daily goals for nutrients listed on the label. Shopping  Avoid buying canned, premade, or processed foods. These foods tend to be high in fat, sodium, and added sugar.  Shop around the outside edge of the grocery store. This includes fresh fruits and vegetables, bulk grains, fresh meats, and fresh dairy. Cooking  Use low-heat cooking methods, such as baking, instead of high-heat cooking methods like deep frying.  Cook using healthy oils, such as olive, canola, or sunflower oil.  Avoid cooking with butter, cream, or high-fat meats. Meal planning  Eat meals and snacks regularly, preferably at the same times every day. Avoid going long periods of time without eating.  Eat foods high in fiber, such as fresh fruits, vegetables, beans, and whole grains. Talk to your dietitian about how many servings of carbs you can eat at each meal.  Eat 4-6 ounces (oz) of lean protein each day, such as lean meat, chicken, fish, eggs, or tofu. One oz of lean protein is equal to: ? 1 oz of meat, chicken, or fish. ? 1 egg. ?  cup of tofu.  Eat some foods each day that contain healthy fats, such as avocado, nuts, seeds, and fish. Lifestyle  Check your blood glucose regularly.  Exercise regularly as told by your health care provider. This may include: ? 150 minutes of moderate-intensity or vigorous-intensity exercise each week. This could be brisk walking, biking, or water aerobics. ? Stretching and doing strength exercises, such as yoga or weightlifting, at least 2 times a week.  Take medicines  as told by your health care provider.  Do not use any products that contain nicotine or tobacco, such as cigarettes and e-cigarettes. If you need help quitting, ask your health care provider.  Work with a Social worker or diabetes educator to identify strategies to manage stress and any emotional and social challenges. Questions to ask a health care provider  Do I need to meet with a diabetes educator?  Do I need to meet with a dietitian?  What number can I call if I have questions?  When are the best times to check my blood glucose? Where to find more information:  American Diabetes Association: diabetes.org  Academy of Nutrition and Dietetics: www.eatright.[employer] of Diabetes and Digestive and Kidney Diseases (NIH): [employer] Summary  A healthy meal plan will help you control your blood glucose and maintain a healthy lifestyle.  Working with a diet and nutrition specialist (dietitian) can help you make a meal plan that is best for you.  Keep in mind that carbohydrates (  carbs) and alcohol have immediate effects on your blood glucose levels. It is important to count carbs and to use alcohol carefully. This information is not intended to replace advice given to you by your health care provider. Make sure you discuss any questions you have with your health care provider. Document Revised: 12/01/2016 Document Reviewed: 01/24/2016 Elsevier Patient Education  2020 [employer].

## 2019-06-18 NOTE — Assessment & Plan Note (Signed)
Uncontrolled, increased A1C to 10.1. Strongly advised endocrinology evaluation, he continues to decline.  Continue NPH 50 units BID. Add back Novolin R, 10 units TID with meals.  Provided patient with glucose logs, encouraged to check glucose levels three times daily.   He is intolerant to metformin XR, cannot afford other medications including oral agents and injectables.   We will plan to see him back in 6 weeks with glucose logs. Strongly advised he work on his diet.

## 2019-06-18 NOTE — Progress Notes (Signed)
Subjective:    Patient ID: [name], male    DOB: 1945-07-18, 74 y.o.   MRN: [id number]  HPI  This visit occurred during the SARS-CoV-2 public health emergency.  Safety protocols were in place, including screening questions prior to the visit, additional usage of staff PPE, and extensive cleaning of exam room while observing appropriate contact time as indicated for disinfecting solutions.   [name] is a 74 year old male with a medical history of atrial fibrillation, hypertension, CAD with history of CABG, uncontrolled diabetes, NASH, IBS, cardiomyopathy who presents today for follow up of diabetes.  Current medications include: NPH 50 units twice daily. He is no longer on Novlin R. He cannot afford other medications, intolerant to Metformin plain and XR.   He is checking his blood glucose 2 times daily and is getting readings of:  AM fasting: 100-175 over the last two weeks, 235 prior to this Before dinner: 100-175  Last A1C: 9.2 in January 2021 Last Eye Exam: Scheduled for later this year  Last Foot Exam:  Pneumonia Vaccination: Completed in 2018 ACE/ARB: Lisinopril  Statin: Crestor  BP Readings from Last 3 Encounters:  06/18/19 (!) 150/80  04/11/19 (!) 154/80  02/26/19 (!) 158/82   He checks his BP at home which typically runs in the 120-130/70's-80's. He endorses a poor diet, large portion sizes, take out food. He feels well overall, denies fatigue, chest pain, dizziness. We discontinued his rapid acting insulin the past as it dropped his glucose to 70 and he felt "shaky". He has refused to see endocrinology numerous times in the past, doesn't wish to go now.   Review of Systems  Constitutional: Negative for fatigue.  Eyes: Negative for visual disturbance.  Respiratory: Negative for shortness of breath.   Cardiovascular: Negative for chest pain.  Neurological: Negative for numbness.       Past Medical History:  Diagnosis Date  . Arthritis    "fingers,  shoulders" (08/03/2015)  . Atrial fibrillation (HCC)    Paroxysmal, rare episodes, sinus rhythm on flecainide, patient prefers not to take Coumadin  . Bradycardia    April, 2013  . Chest pain    Nuclear, 2006, no ischemia  . Chronic lower back pain    "all my life"  . Coronary artery disease    s/p staged cath 05/12/2014 and 5/11, DES x 2 to heavily calcified RCA, residual with 60% prox LAD, 70% D1  . Ejection fraction    EF 55-60%,  echo, January, 2011  . Elevated CPK    CPK elevated with normal MB and normal troponin the past  . Heart murmur   . History of echocardiogram    Echo 8/16: EF 55%, normal wall motion, grade 2 diastolic dysfunction, trivial MR, normal RV function, PASP 27 mmHg  . Hypertension   . IBS (irritable bowel syndrome)   . Myocardial infarction ([place]) 06/2014  . Sinus drainage   . Type II diabetes mellitus ([place])      Social History   Socioeconomic History  . Marital status: Married    Spouse name: Not on file  . Number of children: Not on file  . Years of education: Not on file  . Highest education level: Not on file  Occupational History  . Not on file  Tobacco Use  . Smoking status: Never Smoker  . Smokeless tobacco: Never Used  Vaping Use  . Vaping Use: Never used  Substance and Sexual Activity  . Alcohol use: No  .  Drug use: No  . Sexual activity: Not on file  Other Topics Concern  . Not on file  Social History Narrative  . Not on file   Social Determinants of Health   Financial Resource Strain: Low Risk   . Difficulty of Paying Living Expenses: Not hard at all  Food Insecurity: No Food Insecurity  . Worried About Charity fundraiser in the Last Year: Never true  . Ran Out of Food in the Last Year: Never true  Transportation Needs: No Transportation Needs  . Lack of Transportation (Medical): No  . Lack of Transportation (Non-Medical): No  Physical Activity: Unknown  . Days of Exercise per Week: 3 days  . Minutes of Exercise per Session:  Not on file  Stress: No Stress Concern Present  . Feeling of Stress : Not at all  Social Connections:   . Frequency of Communication with Friends and Family:   . Frequency of Social Gatherings with Friends and Family:   . Attends Religious Services:   . Active Member of Clubs or Organizations:   . Attends Archivist Meetings:   Marland Kitchen Marital Status:   Intimate Partner Violence: Not At Risk  . Fear of Current or Ex-Partner: No  . Emotionally Abused: No  . Physically Abused: No  . Sexually Abused: No    Past Surgical History:  Procedure Laterality Date  . APPENDECTOMY    . CARDIAC CATHETERIZATION N/A 05/12/2014   Procedure: Left Heart Cath and Coronary Angiography;  Surgeon: Troy Sine, MD;  Location: [place] [facility];  Service: Cardiovascular;  Laterality: N/A;  . CARDIAC CATHETERIZATION N/A 05/13/2014   Procedure: Coronary/Graft Atherectomy;  Surgeon: Troy Sine, MD;  Location: [place] [facility];  Service: Cardiovascular;  Laterality: N/A;  . CARDIAC CATHETERIZATION Right 05/13/2014   Procedure: Temporary Pacemaker;  Surgeon: Troy Sine, MD;  Location: [place] [facility];  Service: Cardiovascular;  Laterality: Right;  . CARDIAC CATHETERIZATION N/A 05/13/2014   Procedure: Coronary Stent Intervention;  Surgeon: Troy Sine, MD;  Location: [place] [facility];  Service: Cardiovascular;  Laterality: N/A;  . CARDIAC CATHETERIZATION N/A 06/18/2014   Procedure: Left Heart Cath and Coronary Angiography;  Surgeon: Peter M Martinique, MD;  Location: [place] [facility];  Service: Cardiovascular;  Laterality: N/A;  . CARDIAC CATHETERIZATION N/A 08/03/2015   Procedure: Left Heart Cath and Cors/Grafts Angiography;  Surgeon: Nelva Bush, MD;  Location: [place] [facility];  Service: Cardiovascular;  Laterality: N/A;  . CARDIOVASCULAR STRESS TEST  05/06/2014  . CATARACT EXTRACTION W/ INTRAOCULAR LENS IMPLANT Left   . CORONARY ARTERY BYPASS GRAFT N/A 06/22/2014   Procedure: CORONARY  ARTERY BYPASS GRAFTING (CABG) x five, using left internal mammary artery, and right leg greater saphenous vein harvested endoscopically;  Surgeon: Melrose Nakayama, MD;  Location: [place];  Service: Open Heart Surgery;  Laterality: N/A;  . ICD IMPLANT N/A 01/07/2019   Procedure: ICD IMPLANT;  Surgeon: Thompson Grayer, MD;  Location: [place] [facility];  Service: Cardiovascular;  Laterality: N/A;  . LAPAROSCOPIC CHOLECYSTECTOMY    . LEFT HEART CATH AND CORS/GRAFTS ANGIOGRAPHY N/A 01/06/2019   Procedure: LEFT HEART CATH AND CORS/GRAFTS ANGIOGRAPHY;  Surgeon: Nelva Bush, MD;  Location: [place] [facility];  Service: Cardiovascular;  Laterality: N/A;  . MAZE N/A 06/22/2014   Procedure: MAZE;  Surgeon: Melrose Nakayama, MD;  Location: [place];  Service: Open Heart Surgery;  Laterality: N/A;  . TEE WITHOUT CARDIOVERSION N/A 06/22/2014  Procedure: TRANSESOPHAGEAL ECHOCARDIOGRAM (TEE);  Surgeon: Melrose Nakayama, MD;  Location: [place];  Service: Open Heart Surgery;  Laterality: N/A;    Family History  Problem Relation Age of Onset  . Alzheimer's disease Mother   . Emphysema Father   . Cancer Brother   . Arrhythmia Sister   . Heart attack Sister   . Heart disease Sister   . Hyperlipidemia Sister   . Hypertension Sister     Allergies  Allergen Reactions  . Lipitor [Atorvastatin] Other (See Comments)    Myopathy Spring 2006  . Pravastatin Other (See Comments)    LEG CRAMPS  . Simvastatin Other (See Comments)    LEG CRAMPS    Current Outpatient Medications on File Prior to Visit  Medication Sig Dispense Refill  . acetaminophen (TYLENOL) 325 MG tablet Take 1-2 tablets (325-650 mg total) by mouth every 4 (four) hours as needed for mild pain.    Marland Kitchen albuterol (VENTOLIN HFA) 108 (90 Base) MCG/ACT inhaler INHALE 2 PUFFS INTO THE LUNGS EVERY 4 (FOUR) HOURS AS NEEDED FOR WHEEZING OR SHORTNESS OF BREATH (COUGH, SHORTNESS OF BREATH OR WHEEZING.). 18 g 0  . apixaban (ELIQUIS) 5 MG TABS tablet Take  1 tablet (5 mg total) by mouth 2 (two) times daily. (restart on Saturday) 60 tablet 5  . carvedilol (COREG) 6.25 MG tablet TAKE 1 TABLET BY MOUTH TWICE A DAY WITH FOOD 180 tablet 3  . fexofenadine (ALLEGRA) 180 MG tablet TAKE 1 TABLET (180 MG TOTAL) BY MOUTH DAILY. FOR ALLERGIES. 90 tablet 0  . fluorometholone (FML) 0.1 % ophthalmic suspension Place 1 drop into the right eye daily as needed (for eye redness and itching).     . fluticasone (FLONASE) 50 MCG/ACT nasal spray PLACE 1 SPRAY INTO BOTH NOSTRILS 2 (TWO) TIMES DAILY AS NEEDED FOR ALLERGIES OR RHINITIS. 16 mL 2  . insulin NPH Human (NOVOLIN N RELION) 100 UNIT/ML injection Inject 50 units into the skin every morning and 50 units into the skin in the evening. 30 mL 5  . Insulin Pen Needle 31G X 8 MM MISC Use as directed with Lantus. 100 each 5  . insulin regular (NOVOLIN R) 100 units/mL injection Inject 40 Units into the skin 2 (two) times daily before a meal.    . isosorbide mononitrate (IMDUR) 30 MG 24 hr tablet TAKE 1 TABLET BY MOUTH EVERY DAY 90 tablet 3  . lisinopril (ZESTRIL) 5 MG tablet TAKE 1 TABLET (5 MG TOTAL) DAILY BY MOUTH. 90 tablet 3  . MAGNESIUM PO Take 1 tablet by mouth daily as needed (for leg cramping).     . nitroGLYCERIN (NITROSTAT) 0.4 MG SL tablet PLACE 1 TABLET (0.4 MG TOTAL) UNDER THE TONGUE EVERY 5 (FIVE) MINUTES AS NEEDED FOR CHEST PAIN. 25 tablet 0  . rosuvastatin (CRESTOR) 10 MG tablet TAKE 1 TABLET BY MOUTH EVERY DAY 90 tablet 3  . tamsulosin (FLOMAX) 0.4 MG CAPS capsule TAKE 1 CAPSULE (0.4 MG TOTAL) BY MOUTH AS NEEDED. FOR DIFFICULTY URINATING. 90 capsule 1   No current facility-administered medications on file prior to visit.    BP (!) 150/80   Pulse 74   Temp (!) 95.4 F (35.2 C) (Temporal)   Ht 5' 8"  (1.727 m)   Wt 212 lb 4 oz (96.3 kg)   SpO2 98%   BMI 32.27 kg/m    Objective:   Physical Exam  Cardiovascular: Normal rate and regular rhythm.  Respiratory: Effort normal and breath sounds normal.    Neurological:  He is alert.  Skin: Skin is warm and dry.  Psychiatric: Mood normal.           Assessment & Plan:

## 2019-06-23 ENCOUNTER — Ambulatory Visit (INDEPENDENT_AMBULATORY_CARE_PROVIDER_SITE_OTHER): Payer: Medicare Other

## 2019-06-23 DIAGNOSIS — Z95810 Presence of automatic (implantable) cardiac defibrillator: Secondary | ICD-10-CM | POA: Diagnosis not present

## 2019-06-23 DIAGNOSIS — I5022 Chronic systolic (congestive) heart failure: Secondary | ICD-10-CM | POA: Diagnosis not present

## 2019-06-25 NOTE — Progress Notes (Signed)
EPIC Encounter for ICM Monitoring  Patient Name: [name] is a 74 y.o. male Date: 06/25/2019 Primary Care Physican: [name], NP Primary Cardiologist: Irish Lack Electrophysiologist: Allred Weight:  209 lbs                                                          Time in AF        0.0 hr/day (0.0%)  Spoke with patient.  He reports he is doing well.  He is working on losing weight and getting his BS lower.      Opivol thoracic impedance normal.   No diuretic prescribed  Recommendations: No changes and encouraged to call if experiencing any fluid symptoms.  Follow-up plan: ICM clinic phone appointment on 08/25/2019 (he requests ICM follow every 2 months at this time).  91 day device clinic remote transmission 07/09/2019.     Copy of ICM check sent to Dr. Rayann Heman.  3 month ICM trend: 06/23/2019    1 Year ICM trend:       [name], RN 06/25/2019 2:47 PM

## 2019-07-09 ENCOUNTER — Ambulatory Visit (INDEPENDENT_AMBULATORY_CARE_PROVIDER_SITE_OTHER): Payer: Medicare Other | Admitting: *Deleted

## 2019-07-09 DIAGNOSIS — I255 Ischemic cardiomyopathy: Secondary | ICD-10-CM | POA: Diagnosis not present

## 2019-07-09 LAB — CUP PACEART REMOTE DEVICE CHECK
Battery Remaining Longevity: 135 mo
Battery Voltage: 3.08 V
Brady Statistic RV Percent Paced: 0.02 %
Date Time Interrogation Session: 20210707001804
HighPow Impedance: 77 Ohm
Implantable Lead Implant Date: 20210105
Implantable Lead Location: 753860
Implantable Pulse Generator Implant Date: 20210105
Lead Channel Impedance Value: 380 Ohm
Lead Channel Impedance Value: 456 Ohm
Lead Channel Pacing Threshold Amplitude: 0.375 V
Lead Channel Pacing Threshold Pulse Width: 0.4 ms
Lead Channel Sensing Intrinsic Amplitude: 10.375 mV
Lead Channel Sensing Intrinsic Amplitude: 10.375 mV
Lead Channel Setting Pacing Amplitude: 2.5 V
Lead Channel Setting Pacing Pulse Width: 0.4 ms
Lead Channel Setting Sensing Sensitivity: 0.3 mV

## 2019-07-11 NOTE — Progress Notes (Signed)
Remote ICD transmission.   

## 2019-08-18 ENCOUNTER — Ambulatory Visit: Payer: Medicare Other | Admitting: Primary Care

## 2019-08-20 DIAGNOSIS — E11319 Type 2 diabetes mellitus with unspecified diabetic retinopathy without macular edema: Secondary | ICD-10-CM | POA: Diagnosis not present

## 2019-08-20 DIAGNOSIS — H3563 Retinal hemorrhage, bilateral: Secondary | ICD-10-CM | POA: Diagnosis not present

## 2019-08-20 DIAGNOSIS — H524 Presbyopia: Secondary | ICD-10-CM | POA: Diagnosis not present

## 2019-08-20 DIAGNOSIS — H52223 Regular astigmatism, bilateral: Secondary | ICD-10-CM | POA: Diagnosis not present

## 2019-08-20 DIAGNOSIS — H5203 Hypermetropia, bilateral: Secondary | ICD-10-CM | POA: Diagnosis not present

## 2019-08-20 DIAGNOSIS — E113393 Type 2 diabetes mellitus with moderate nonproliferative diabetic retinopathy without macular edema, bilateral: Secondary | ICD-10-CM | POA: Diagnosis not present

## 2019-08-20 DIAGNOSIS — E119 Type 2 diabetes mellitus without complications: Secondary | ICD-10-CM | POA: Diagnosis not present

## 2019-08-20 DIAGNOSIS — H3589 Other specified retinal disorders: Secondary | ICD-10-CM | POA: Diagnosis not present

## 2019-08-20 LAB — HM DIABETES EYE EXAM

## 2019-08-25 ENCOUNTER — Ambulatory Visit (INDEPENDENT_AMBULATORY_CARE_PROVIDER_SITE_OTHER): Payer: Medicare Other

## 2019-08-25 DIAGNOSIS — I5022 Chronic systolic (congestive) heart failure: Secondary | ICD-10-CM

## 2019-08-25 DIAGNOSIS — Z95810 Presence of automatic (implantable) cardiac defibrillator: Secondary | ICD-10-CM | POA: Diagnosis not present

## 2019-08-26 ENCOUNTER — Other Ambulatory Visit: Payer: Self-pay

## 2019-08-26 ENCOUNTER — Encounter: Payer: Self-pay | Admitting: Primary Care

## 2019-08-26 ENCOUNTER — Ambulatory Visit (INDEPENDENT_AMBULATORY_CARE_PROVIDER_SITE_OTHER): Payer: Medicare Other | Admitting: Primary Care

## 2019-08-26 DIAGNOSIS — E1159 Type 2 diabetes mellitus with other circulatory complications: Secondary | ICD-10-CM

## 2019-08-26 DIAGNOSIS — I255 Ischemic cardiomyopathy: Secondary | ICD-10-CM

## 2019-08-26 DIAGNOSIS — Z794 Long term (current) use of insulin: Secondary | ICD-10-CM

## 2019-08-26 MED ORDER — INSULIN NPH (HUMAN) (ISOPHANE) 100 UNIT/ML ~~LOC~~ SUSP: SUBCUTANEOUS | 5 refills | Status: DC

## 2019-08-26 NOTE — Progress Notes (Signed)
Subjective:    Patient ID: [name], male    DOB: 06/12/1945, 74 y.o.   MRN: [id number]  HPI  This visit occurred during the SARS-CoV-2 public health emergency.  Safety protocols were in place, including screening questions prior to the visit, additional usage of staff PPE, and extensive cleaning of exam room while observing appropriate contact time as indicated for disinfecting solutions.   [name] is a 74 year old male with a history of CAD, atrial fibrillation, hypertension, NASH, type 2 diabetes who presents today for follow up of diabetes.  Current medications include: Relion NPH 50 units BID. We discontinued his regular insulin last visit due to lack of improvement.   He is actually doing NPH 55 units BID. He resumed his glyburide-metformin and took 1/2 tablet yesterday. He has not used regular inulin. Denies low readings below 80.  He is checking his blood glucose 3-5 times daily and is getting readings of:  AM fasting: high 100's to low 200's Before Lunch: Same 2 hours after Dinner: Same  Last A1C: 10.1 in June 2021  Last Eye Exam: Due Last Foot Exam: UTD Pneumonia Vaccination: UTD ACE/ARB: Lisinopril  Statin: Crestor  BP Readings from Last 3 Encounters:  08/26/19 120/82  06/18/19 (!) 150/80  04/11/19 (!) 154/80     Review of Systems  Eyes: Negative for visual disturbance.  Respiratory: Negative for shortness of breath.   Cardiovascular: Negative for chest pain.  Neurological: Negative for dizziness.       Past Medical History:  Diagnosis Date   Arthritis    "fingers, shoulders" (08/03/2015)   Atrial fibrillation (HCC)    Paroxysmal, rare episodes, sinus rhythm on flecainide, patient prefers not to take Coumadin   Bradycardia    April, 2013   Chest pain    Nuclear, 2006, no ischemia   Chronic lower back pain    "all my life"   Coronary artery disease    s/p staged cath 05/12/2014 and 5/11, DES x 2 to heavily calcified RCA, residual with 60%  prox LAD, 70% D1   Ejection fraction    EF 55-60%,  echo, January, 2011   Elevated CPK    CPK elevated with normal MB and normal troponin the past   Heart murmur    History of echocardiogram    Echo 8/16: EF 55%, normal wall motion, grade 2 diastolic dysfunction, trivial MR, normal RV function, PASP 27 mmHg   Hypertension    IBS (irritable bowel syndrome)    Myocardial infarction ([place]) 06/2014   Sinus drainage    Type II diabetes mellitus ([place])      Social History   Socioeconomic History   Marital status: Married    Spouse name: Not on file   Number of children: Not on file   Years of education: Not on file   Highest education level: Not on file  Occupational History   Not on file  Tobacco Use   Smoking status: Never Smoker   Smokeless tobacco: Never Used  Vaping Use   Vaping Use: Never used  Substance and Sexual Activity   Alcohol use: No   Drug use: No   Sexual activity: Not on file  Other Topics Concern   Not on file  Social History Narrative   Not on file   Social Determinants of Health   Financial Resource Strain: Low Risk    Difficulty of Paying Living Expenses: Not hard at all  Food Insecurity: No Food Insecurity  Worried About Charity fundraiser in the Last Year: Never true   [place] in the Last Year: Never true  Transportation Needs: No Transportation Needs   Lack of Transportation (Medical): No   Lack of Transportation (Non-Medical): No  Physical Activity: Unknown   Days of Exercise per Week: 3 days   Minutes of Exercise per Session: Not on file  Stress: No Stress Concern Present   Feeling of Stress : Not at all  Social Connections:    Frequency of Communication with Friends and Family: Not on file   Frequency of Social Gatherings with Friends and Family: Not on file   Attends Religious Services: Not on file   Active Member of Clubs or Organizations: Not on file   Attends Archivist Meetings:  Not on file   Marital Status: Not on file  Intimate Partner Violence: Not At Risk   Fear of Current or Ex-Partner: No   Emotionally Abused: No   Physically Abused: No   Sexually Abused: No    Past Surgical History:  Procedure Laterality Date   APPENDECTOMY     CARDIAC CATHETERIZATION N/A 05/12/2014   Procedure: Left Heart Cath and Coronary Angiography;  Surgeon: Troy Sine, MD;  Location: [place] [facility];  Service: Cardiovascular;  Laterality: N/A;   CARDIAC CATHETERIZATION N/A 05/13/2014   Procedure: Coronary/Graft Atherectomy;  Surgeon: Troy Sine, MD;  Location: [place] [facility];  Service: Cardiovascular;  Laterality: N/A;   CARDIAC CATHETERIZATION Right 05/13/2014   Procedure: Temporary Pacemaker;  Surgeon: Troy Sine, MD;  Location: [place] [facility];  Service: Cardiovascular;  Laterality: Right;   CARDIAC CATHETERIZATION N/A 05/13/2014   Procedure: Coronary Stent Intervention;  Surgeon: Troy Sine, MD;  Location: [place] [place];  Service: Cardiovascular;  Laterality: N/A;   CARDIAC CATHETERIZATION N/A 06/18/2014   Procedure: Left Heart Cath and Coronary Angiography;  Surgeon: Peter M Martinique, MD;  Location: [place] [facility];  Service: Cardiovascular;  Laterality: N/A;   CARDIAC CATHETERIZATION N/A 08/03/2015   Procedure: Left Heart Cath and Cors/Grafts Angiography;  Surgeon: Nelva Bush, MD;  Location: [place] [facility];  Service: Cardiovascular;  Laterality: N/A;   CARDIOVASCULAR STRESS TEST  05/06/2014   CATARACT EXTRACTION W/ INTRAOCULAR LENS IMPLANT Left    CORONARY ARTERY BYPASS GRAFT N/A 06/22/2014   Procedure: CORONARY ARTERY BYPASS GRAFTING (CABG) x five, using left internal mammary artery, and right leg greater saphenous vein harvested endoscopically;  Surgeon: Melrose Nakayama, MD;  Location: [place];  Service: Open Heart Surgery;  Laterality: N/A;   ICD IMPLANT N/A 01/07/2019   Procedure: ICD IMPLANT;  Surgeon: Thompson Grayer, MD;   Location: [place] [facility];  Service: Cardiovascular;  Laterality: N/A;   LAPAROSCOPIC CHOLECYSTECTOMY     LEFT HEART CATH AND CORS/GRAFTS ANGIOGRAPHY N/A 01/06/2019   Procedure: LEFT HEART CATH AND CORS/GRAFTS ANGIOGRAPHY;  Surgeon: Nelva Bush, MD;  Location: [place] [facility];  Service: Cardiovascular;  Laterality: N/A;   MAZE N/A 06/22/2014   Procedure: MAZE;  Surgeon: Melrose Nakayama, MD;  Location: [place];  Service: Open Heart Surgery;  Laterality: N/A;   TEE WITHOUT CARDIOVERSION N/A 06/22/2014   Procedure: TRANSESOPHAGEAL ECHOCARDIOGRAM (TEE);  Surgeon: Melrose Nakayama, MD;  Location: [place];  Service: Open Heart Surgery;  Laterality: N/A;    Family History  Problem Relation Age of Onset   Alzheimer's disease Mother    Emphysema Father    Cancer Brother  Arrhythmia Sister    Heart attack Sister    Heart disease Sister    Hyperlipidemia Sister    Hypertension Sister     Allergies  Allergen Reactions   Lipitor [Atorvastatin] Other (See Comments)    Myopathy Spring 2006   Pravastatin Other (See Comments)    LEG CRAMPS   Simvastatin Other (See Comments)    LEG CRAMPS    Current Outpatient Medications on File Prior to Visit  Medication Sig Dispense Refill   acetaminophen (TYLENOL) 325 MG tablet Take 1-2 tablets (325-650 mg total) by mouth every 4 (four) hours as needed for mild pain.     albuterol (VENTOLIN HFA) 108 (90 Base) MCG/ACT inhaler INHALE 2 PUFFS INTO THE LUNGS EVERY 4 (FOUR) HOURS AS NEEDED FOR WHEEZING OR SHORTNESS OF BREATH (COUGH, SHORTNESS OF BREATH OR WHEEZING.). 18 g 0   apixaban (ELIQUIS) 5 MG TABS tablet Take 1 tablet (5 mg total) by mouth 2 (two) times daily. (restart on Saturday) 60 tablet 5   carvedilol (COREG) 6.25 MG tablet TAKE 1 TABLET BY MOUTH TWICE A DAY WITH FOOD 180 tablet 3   fexofenadine (ALLEGRA) 180 MG tablet TAKE 1 TABLET (180 MG TOTAL) BY MOUTH DAILY. FOR ALLERGIES. 90 tablet 0   fluorometholone (FML) 0.1  % ophthalmic suspension Place 1 drop into the right eye daily as needed (for eye redness and itching).      fluticasone (FLONASE) 50 MCG/ACT nasal spray PLACE 1 SPRAY INTO BOTH NOSTRILS 2 (TWO) TIMES DAILY AS NEEDED FOR ALLERGIES OR RHINITIS. 16 mL 2   insulin NPH Human (NOVOLIN N RELION) 100 UNIT/ML injection Inject 50 units into the skin every morning and 50 units into the skin in the evening. 30 mL 5   Insulin Pen Needle 31G X 8 MM MISC Use as directed with Lantus. 100 each 5   insulin regular (NOVOLIN R) 100 units/mL injection Inject 10 Units into the skin 3 (three) times daily with meals as needed.     isosorbide mononitrate (IMDUR) 30 MG 24 hr tablet TAKE 1 TABLET BY MOUTH EVERY DAY 90 tablet 3   lisinopril (ZESTRIL) 5 MG tablet TAKE 1 TABLET (5 MG TOTAL) DAILY BY MOUTH. 90 tablet 3   MAGNESIUM PO Take 1 tablet by mouth daily as needed (for leg cramping).      nitroGLYCERIN (NITROSTAT) 0.4 MG SL tablet PLACE 1 TABLET (0.4 MG TOTAL) UNDER THE TONGUE EVERY 5 (FIVE) MINUTES AS NEEDED FOR CHEST PAIN. 25 tablet 0   rosuvastatin (CRESTOR) 10 MG tablet TAKE 1 TABLET BY MOUTH EVERY DAY 90 tablet 3   tamsulosin (FLOMAX) 0.4 MG CAPS capsule TAKE 1 CAPSULE (0.4 MG TOTAL) BY MOUTH AS NEEDED. FOR DIFFICULTY URINATING. 90 capsule 1   No current facility-administered medications on file prior to visit.    BP 120/82    Pulse 67    Temp (!) 96.4 F (35.8 C) (Temporal)    Ht 5' 8"  (1.727 m)    Wt 211 lb 8 oz (95.9 kg)    SpO2 98%    BMI 32.16 kg/m    Objective:   Physical Exam Cardiovascular:     Rate and Rhythm: Normal rate and regular rhythm.  Pulmonary:     Effort: Pulmonary effort is normal.     Breath sounds: Normal breath sounds.  Musculoskeletal:     Cervical back: Neck supple.  Skin:    General: Skin is warm and dry.  Neurological:     Mental Status: He is  oriented to person, place, and time.            Assessment & Plan:

## 2019-08-26 NOTE — Assessment & Plan Note (Signed)
Uncontrolled with glucose readings above goal. Would like to see readings below 150 consistently.  He increased his NPH to 55 units BID, this seems okay. He did NOT add back in regular insulin, so we will start this today. Start regular insulin at 10 units TID with meals.   Hold glyburide-metformin for now.  Continue to monitor glucose readings.  Follow up in 6 weeks for repeat A1C.

## 2019-08-26 NOTE — Progress Notes (Signed)
EPIC Encounter for ICM Monitoring  Patient Name: [name] is a 74 y.o. male Date: 08/26/2019 Primary Care Physican: [name], NP Primary Cardiologist:[place] Electrophysiologist:Allred 08/26/2019 Weight: 210 lbs  Time in AF 0.0 hr/day (0.0%)  Spoke with patient.  He reports he is doing well and denies any fluid symptoms.  He is not strict with salt or fluid intake.  He drinks a lot of water and constantly keeps a 16 ounce cup filled all day.   Continues to work on improving A1C.  Opivol thoracic impedance suggesting possible fluid accumulation since 08/21/2019.  Impedance also decreased from 7/20-8/9.  No diuretic prescribed  Recommendations:Recommendation to limit salt intake to 2000 mg daily and fluid intake to 64 oz daily. Encouraged him not to use a salt shaker.  Encouraged to call if experiencing any fluid symptoms.   Follow-up plan: ICM clinic phone appointment on8/30/2021 to recheck fluid levels (he requests ICM follow every 2 months at this time).  91 day device clinic remote transmission 10/08/2019.    EP/Cardiology Office Visits: Recalls for 08/25/2019 with Dr. Beau Fanny and 04/08/2020 with Dr Lovena Le.    Copy of ICM check sent to Dr. Rayann Heman and Dr Irish Lack for review.   3 month ICM trend: 08/25/2019    1 Year ICM trend:       [name], RN 08/26/2019 12:13 PM

## 2019-08-26 NOTE — Patient Instructions (Signed)
Continue to monitor glucose readings.  Continue NPH insulin at 55 units twice daily.  START regular insulin at 10 units three times daily with meals.  Do not take glyburide-metformin for now.  It is important that you improve your diet. Please limit carbohydrates in the form of white bread, rice, pasta, sweets, fast food, fried food, sugary drinks, etc. Increase your consumption of fresh fruits and vegetables, whole grains, lean protein.  Ensure you are consuming 64 ounces of water daily.  Please schedule a follow up visit for 6 weeks for follow up diabetes.  It was a pleasure to see you today!   Diabetes Mellitus and Nutrition, Adult When you have diabetes (diabetes mellitus), it is very important to have healthy eating habits because your blood sugar (glucose) levels are greatly affected by what you eat and drink. Eating healthy foods in the appropriate amounts, at about the same times every day, can help you:  Control your blood glucose.  Lower your risk of heart disease.  Improve your blood pressure.  Reach or maintain a healthy weight. Every person with diabetes is different, and each person has different needs for a meal plan. Your health care provider may recommend that you work with a diet and nutrition specialist (dietitian) to make a meal plan that is best for you. Your meal plan may vary depending on factors such as:  The calories you need.  The medicines you take.  Your weight.  Your blood glucose, blood pressure, and cholesterol levels.  Your activity level.  Other health conditions you have, such as heart or kidney disease. How do carbohydrates affect me? Carbohydrates, also called carbs, affect your blood glucose level more than any other type of food. Eating carbs naturally raises the amount of glucose in your blood. Carb counting is a method for keeping track of how many carbs you eat. Counting carbs is important to keep your blood glucose at a healthy level,  especially if you use insulin or take certain oral diabetes medicines. It is important to know how many carbs you can safely have in each meal. This is different for every person. Your dietitian can help you calculate how many carbs you should have at each meal and for each snack. Foods that contain carbs include:  Bread, cereal, rice, pasta, and crackers.  Potatoes and corn.  Peas, beans, and lentils.  Milk and yogurt.  Fruit and juice.  Desserts, such as cakes, cookies, ice cream, and candy. How does alcohol affect me? Alcohol can cause a sudden decrease in blood glucose (hypoglycemia), especially if you use insulin or take certain oral diabetes medicines. Hypoglycemia can be a life-threatening condition. Symptoms of hypoglycemia (sleepiness, dizziness, and confusion) are similar to symptoms of having too much alcohol. If your health care provider says that alcohol is safe for you, follow these guidelines:  Limit alcohol intake to no more than 1 drink per day for nonpregnant women and 2 drinks per day for men. One drink equals 12 oz of beer, 5 oz of wine, or 1 oz of hard liquor.  Do not drink on an empty stomach.  Keep yourself hydrated with water, diet soda, or unsweetened iced tea.  Keep in mind that regular soda, juice, and other mixers may contain a lot of sugar and must be counted as carbs. What are tips for following this plan?  Reading food labels  Start by checking the serving size on the "Nutrition Facts" label of packaged foods and drinks. The amount of  calories, carbs, fats, and other nutrients listed on the label is based on one serving of the item. Many items contain more than one serving per package.  Check the total grams (g) of carbs in one serving. You can calculate the number of servings of carbs in one serving by dividing the total carbs by 15. For example, if a food has 30 g of total carbs, it would be equal to 2 servings of carbs.  Check the number of grams  (g) of saturated and trans fats in one serving. Choose foods that have low or no amount of these fats.  Check the number of milligrams (mg) of salt (sodium) in one serving. Most people should limit total sodium intake to less than 2,300 mg per day.  Always check the nutrition information of foods labeled as "low-fat" or "nonfat". These foods may be higher in added sugar or refined carbs and should be avoided.  Talk to your dietitian to identify your daily goals for nutrients listed on the label. Shopping  Avoid buying canned, premade, or processed foods. These foods tend to be high in fat, sodium, and added sugar.  Shop around the outside edge of the grocery store. This includes fresh fruits and vegetables, bulk grains, fresh meats, and fresh dairy. Cooking  Use low-heat cooking methods, such as baking, instead of high-heat cooking methods like deep frying.  Cook using healthy oils, such as olive, canola, or sunflower oil.  Avoid cooking with butter, cream, or high-fat meats. Meal planning  Eat meals and snacks regularly, preferably at the same times every day. Avoid going long periods of time without eating.  Eat foods high in fiber, such as fresh fruits, vegetables, beans, and whole grains. Talk to your dietitian about how many servings of carbs you can eat at each meal.  Eat 4-6 ounces (oz) of lean protein each day, such as lean meat, chicken, fish, eggs, or tofu. One oz of lean protein is equal to: ? 1 oz of meat, chicken, or fish. ? 1 egg. ?  cup of tofu.  Eat some foods each day that contain healthy fats, such as avocado, nuts, seeds, and fish. Lifestyle  Check your blood glucose regularly.  Exercise regularly as told by your health care provider. This may include: ? 150 minutes of moderate-intensity or vigorous-intensity exercise each week. This could be brisk walking, biking, or water aerobics. ? Stretching and doing strength exercises, such as yoga or weightlifting, at  least 2 times a week.  Take medicines as told by your health care provider.  Do not use any products that contain nicotine or tobacco, such as cigarettes and e-cigarettes. If you need help quitting, ask your health care provider.  Work with a Social worker or diabetes educator to identify strategies to manage stress and any emotional and social challenges. Questions to ask a health care provider  Do I need to meet with a diabetes educator?  Do I need to meet with a dietitian?  What number can I call if I have questions?  When are the best times to check my blood glucose? Where to find more information:  American Diabetes Association: diabetes.org  Academy of Nutrition and Dietetics: www.eatright.[employer] of Diabetes and Digestive and Kidney Diseases (NIH): [employer] Summary  A healthy meal plan will help you control your blood glucose and maintain a healthy lifestyle.  Working with a diet and nutrition specialist (dietitian) can help you make a meal plan that is best for you.  Keep in mind that carbohydrates (carbs) and alcohol have immediate effects on your blood glucose levels. It is important to count carbs and to use alcohol carefully. This information is not intended to replace advice given to you by your health care provider. Make sure you discuss any questions you have with your health care provider. Document Revised: 12/01/2016 Document Reviewed: 01/24/2016 Elsevier Patient Education  2020 [employer].

## 2019-09-01 ENCOUNTER — Ambulatory Visit (INDEPENDENT_AMBULATORY_CARE_PROVIDER_SITE_OTHER): Payer: Medicare Other

## 2019-09-01 DIAGNOSIS — Z95810 Presence of automatic (implantable) cardiac defibrillator: Secondary | ICD-10-CM

## 2019-09-01 DIAGNOSIS — I5022 Chronic systolic (congestive) heart failure: Secondary | ICD-10-CM

## 2019-09-02 NOTE — Progress Notes (Signed)
EPIC Encounter for ICM Monitoring  Patient Name: [name] is a 74 y.o. male Date: 09/02/2019 Primary Care Physican: [name], NP Primary Cardiologist:Var[place]asi Electrophysiologist:Allred 08/26/2019 Weight:210 lbs  Time in AF0.0 hr/day (0.0%)  Spoke with patient. He reports he is doing well and denies any fluid symptoms.  He has decreased fluid intake since last ICM remote transmission which improved impedance.    Opivol thoracic impedance improving and trending close to normal.  No diuretic prescribed  Recommendations:Recommendation to limit salt intake to 2000 mg daily and fluid intake to 64 oz daily. Encouraged him not to use a salt shaker.  Encouraged to call if experiencing any fluid symptoms.   Follow-up plan: ICM clinic phone appointment on10/25/2021 to recheck fluid levels(he requests ICM follow every 2 months at this time).  91 day device clinic remote transmission 10/08/2019.    EP/Cardiology Office Visits: Recalls for 08/25/2019 with Dr. Beau Fanny and 04/08/2020 with Dr Lovena Le.    Copy of ICM check sent to Dr. Rayann Heman  3 month ICM trend: 09/01/2019    1 Year ICM trend:       [name], RN 09/02/2019 3:52 PM

## 2019-10-08 ENCOUNTER — Ambulatory Visit (INDEPENDENT_AMBULATORY_CARE_PROVIDER_SITE_OTHER): Payer: Medicare Other

## 2019-10-08 DIAGNOSIS — I255 Ischemic cardiomyopathy: Secondary | ICD-10-CM

## 2019-10-09 LAB — CUP PACEART REMOTE DEVICE CHECK
Battery Remaining Longevity: 133 mo
Battery Voltage: 3.05 V
Brady Statistic RV Percent Paced: 0.02 %
Date Time Interrogation Session: 20211006001702
HighPow Impedance: 73 Ohm
Implantable Lead Implant Date: 20210105
Implantable Lead Location: 753860
Implantable Pulse Generator Implant Date: 20210105
Lead Channel Impedance Value: 380 Ohm
Lead Channel Impedance Value: 456 Ohm
Lead Channel Pacing Threshold Amplitude: 0.5 V
Lead Channel Pacing Threshold Pulse Width: 0.4 ms
Lead Channel Sensing Intrinsic Amplitude: 10 mV
Lead Channel Sensing Intrinsic Amplitude: 10 mV
Lead Channel Setting Pacing Amplitude: 2.5 V
Lead Channel Setting Pacing Pulse Width: 0.4 ms
Lead Channel Setting Sensing Sensitivity: 0.3 mV

## 2019-10-13 NOTE — Progress Notes (Signed)
Remote ICD transmission.   

## 2019-10-27 ENCOUNTER — Ambulatory Visit (INDEPENDENT_AMBULATORY_CARE_PROVIDER_SITE_OTHER): Payer: Medicare Other

## 2019-10-27 DIAGNOSIS — I5022 Chronic systolic (congestive) heart failure: Secondary | ICD-10-CM

## 2019-10-27 DIAGNOSIS — Z95810 Presence of automatic (implantable) cardiac defibrillator: Secondary | ICD-10-CM | POA: Diagnosis not present

## 2019-10-28 DIAGNOSIS — Z23 Encounter for immunization: Secondary | ICD-10-CM | POA: Diagnosis not present

## 2019-10-31 NOTE — Progress Notes (Signed)
EPIC Encounter for ICM Monitoring  Patient Name: [name] is a 74 y.o. male Date: 10/31/2019 Primary Care Physican: [name], NP Primary Cardiologist:Varanasi Electrophysiologist:Allred 8/24/2021Weight:210lbs  Time in AF0.0 hr/day (0.0%)  Spoke with patient. He reports he is doing welland denies any fluid symptoms. He does not follow low salt diet and admits to liking salt.  Advised to limit salt intake which can contribute to fluid accumulation and make the heart work harder.  Opivol thoracic impedancesuggesting possible fluid accumulation but trending close to baseline.  No diuretic prescribed  Recommendations:Recommendation to limit salt intake to 2000 mg daily and fluid intake to 64 oz daily.Encouraged him not to use a salt shaker.Encouraged to call if experiencing any fluid symptoms.   Follow-up plan: ICM clinic phone appointment on1/06/2020(he requests ICM follow every 2 months at this time).91 day device clinic remote transmission 01/07/2020.   EP/Cardiology Office Visits:Recalls for 08/25/2019 with Dr.Varansi and 04/08/2020 with Dr Lovena Le.   Copy of ICM check sent to Dr.Allred   3 month ICM trend: 10/27/2019    1 Year ICM trend:       [name], RN 10/31/2019 4:59 PM

## 2019-11-25 DIAGNOSIS — H52223 Regular astigmatism, bilateral: Secondary | ICD-10-CM | POA: Diagnosis not present

## 2019-11-25 DIAGNOSIS — H5203 Hypermetropia, bilateral: Secondary | ICD-10-CM | POA: Diagnosis not present

## 2019-11-25 DIAGNOSIS — H524 Presbyopia: Secondary | ICD-10-CM | POA: Diagnosis not present

## 2019-11-25 DIAGNOSIS — H43392 Other vitreous opacities, left eye: Secondary | ICD-10-CM | POA: Diagnosis not present

## 2019-11-26 ENCOUNTER — Ambulatory Visit (INDEPENDENT_AMBULATORY_CARE_PROVIDER_SITE_OTHER): Payer: Medicare Other | Admitting: Primary Care

## 2019-11-26 ENCOUNTER — Other Ambulatory Visit: Payer: Self-pay

## 2019-11-26 ENCOUNTER — Encounter: Payer: Self-pay | Admitting: Primary Care

## 2019-11-26 VITALS — BP 120/82 | HR 61 | Temp 98.4°F | Ht 68.0 in | Wt 216.0 lb

## 2019-11-26 DIAGNOSIS — Z1211 Encounter for screening for malignant neoplasm of colon: Secondary | ICD-10-CM | POA: Diagnosis not present

## 2019-11-26 DIAGNOSIS — N400 Enlarged prostate without lower urinary tract symptoms: Secondary | ICD-10-CM | POA: Diagnosis not present

## 2019-11-26 DIAGNOSIS — E782 Mixed hyperlipidemia: Secondary | ICD-10-CM

## 2019-11-26 DIAGNOSIS — Z95810 Presence of automatic (implantable) cardiac defibrillator: Secondary | ICD-10-CM | POA: Diagnosis not present

## 2019-11-26 DIAGNOSIS — Z794 Long term (current) use of insulin: Secondary | ICD-10-CM | POA: Diagnosis not present

## 2019-11-26 DIAGNOSIS — I25810 Atherosclerosis of coronary artery bypass graft(s) without angina pectoris: Secondary | ICD-10-CM

## 2019-11-26 DIAGNOSIS — I480 Paroxysmal atrial fibrillation: Secondary | ICD-10-CM

## 2019-11-26 DIAGNOSIS — Z125 Encounter for screening for malignant neoplasm of prostate: Secondary | ICD-10-CM | POA: Diagnosis not present

## 2019-11-26 DIAGNOSIS — K7581 Nonalcoholic steatohepatitis (NASH): Secondary | ICD-10-CM

## 2019-11-26 DIAGNOSIS — I255 Ischemic cardiomyopathy: Secondary | ICD-10-CM

## 2019-11-26 DIAGNOSIS — I10 Essential (primary) hypertension: Secondary | ICD-10-CM

## 2019-11-26 DIAGNOSIS — E1159 Type 2 diabetes mellitus with other circulatory complications: Secondary | ICD-10-CM | POA: Diagnosis not present

## 2019-11-26 LAB — COMPREHENSIVE METABOLIC PANEL
ALT: 41 U/L (ref 0–53)
AST: 21 U/L (ref 0–37)
Albumin: 4.1 g/dL (ref 3.5–5.2)
Alkaline Phosphatase: 56 U/L (ref 39–117)
BUN: 17 mg/dL (ref 6–23)
CO2: 29 mEq/L (ref 19–32)
Calcium: 9.2 mg/dL (ref 8.4–10.5)
Chloride: 105 mEq/L (ref 96–112)
Creatinine, Ser: 1.2 mg/dL (ref 0.40–1.50)
GFR: 59.59 mL/min — ABNORMAL LOW (ref >60.00)
Glucose, Bld: 102 mg/dL — ABNORMAL HIGH (ref 70–99)
Potassium: 4.5 mEq/L (ref 3.5–5.1)
Sodium: 141 mEq/L (ref 135–145)
Total Bilirubin: 0.8 mg/dL (ref 0.2–1.2)
Total Protein: 6.7 g/dL (ref 6.0–8.3)

## 2019-11-26 LAB — LIPID PANEL
Cholesterol: 159 mg/dL (ref 0–200)
HDL: 33.4 mg/dL — ABNORMAL LOW (ref >39.00)
LDL Cholesterol: 108 mg/dL — ABNORMAL HIGH (ref 0–99)
NonHDL: 125.3
Total CHOL/HDL Ratio: 5
Triglycerides: 86 mg/dL (ref 0.0–149.0)
VLDL: 17.2 mg/dL (ref 0.0–40.0)

## 2019-11-26 LAB — POCT GLYCOSYLATED HEMOGLOBIN (HGB A1C): Hemoglobin A1C: 9.1 % — AB (ref 4.0–5.6)

## 2019-11-26 LAB — HM DIABETES EYE EXAM

## 2019-11-26 LAB — PSA, MEDICARE: PSA: 1.18 ng/ml (ref 0.10–4.00)

## 2019-11-26 LAB — CBC
HCT: 42.1 % (ref 39.0–52.0)
Hemoglobin: 13.8 g/dL (ref 13.0–17.0)
MCHC: 32.8 g/dL (ref 30.0–36.0)
MCV: 90.7 fl (ref 78.0–100.0)
Platelets: 207 10*3/uL (ref 150.0–400.0)
RBC: 4.64 Mil/uL (ref 4.22–5.81)
RDW: 14.4 % (ref 11.5–15.5)
WBC: 7.2 10*3/uL (ref 4.0–10.5)

## 2019-11-26 NOTE — Assessment & Plan Note (Signed)
No history of firing Last remote pacemaker check reviewed.

## 2019-11-26 NOTE — Progress Notes (Signed)
Subjective:    Patient ID: [name], male    DOB: 1945-09-17, 74 y.o.   MRN: [id number]  HPI  This visit occurred during the SARS-CoV-2 public health emergency.  Safety protocols were in place, including screening questions prior to the visit, additional usage of staff PPE, and extensive cleaning of exam room while observing appropriate contact time as indicated for disinfecting solutions.   [name] is a 74 year old male with a history of hypertension, atrial fibrillation, CAD, unstable angina, IBS, NASH, uncontrolled type 2 diabetes who presents today for follow up.  1) Type 2 Diabetes:  Current medications include: Novolin N 55 untis BID, Novolin R 10 units BID.   He is checking his blood glucose 3 times daily and is getting readings of mid 100's to low 200's.   Lowest reading: 78. Occurred once.  Last A1C: 10.1 in June 2021, 9.1 today Last Eye Exam: Due Last Foot Exam: UTD Pneumonia Vaccination: Completed last in 2018 ACE/ARB: Lisinopril Statin: Crestor  He endorses a poor diet, does not watch his diet at all. His wife prepares cakes and sweets frequently and he endulges, he also eats ice cream several times weekly. Mostly eats fast food/take out food. He's been celebrating birthdays for family members from June through now, this makes it tough to eat healthy. He refuses to see a diabetes nutritionist and endocrinology.   2) CAD/Hypertension/Unstable Angina/Atrial Fibrillation/Cardiomyopathy: Currently following with cardiology and electrophysiology. Managed apixaban 5 mg BID, carvedilol 6.25 mg BID, Imdur 30 mg, Lisinopril 5 mg daily, rosuvastatin 10 mg.  He denies chest pain, shortness of breath, dizziness. He has an implanted ICD/Pacemaker, denies any shocks.  BP Readings from Last 3 Encounters:  11/26/19 120/82  08/26/19 120/82  06/18/19 (!) 150/80   3) Chronic Back Pain: Chronic to left lower back with intermittent radiation to left lower extremity. This began 6  months ago, increased pain with moderate and prolonged activity. Today his pain isn't so bothersome. He does wear a back brace when playing golf, this helps. He denies numbness/tingling, saddle anesthesia, loss of bowel/bladder control.  Wt Readings from Last 3 Encounters:  11/26/19 216 lb (98 kg)  08/26/19 211 lb 8 oz (95.9 kg)  06/18/19 212 lb 4 oz (96.3 kg)     Review of Systems  Eyes: Negative for visual disturbance.  Respiratory: Negative for shortness of breath.   Cardiovascular: Negative for chest pain.  Musculoskeletal: Positive for back pain.  Neurological: Negative for dizziness.       Past Medical History:  Diagnosis Date  . Arthritis    "fingers, shoulders" (08/03/2015)  . Atrial fibrillation (HCC)    Paroxysmal, rare episodes, sinus rhythm on flecainide, patient prefers not to take Coumadin  . Bradycardia    April, 2013  . Chest pain    Nuclear, 2006, no ischemia  . Chronic lower back pain    "all my life"  . Coronary artery disease    s/p staged cath 05/12/2014 and 5/11, DES x 2 to heavily calcified RCA, residual with 60% prox LAD, 70% D1  . Ejection fraction    EF 55-60%,  echo, January, 2011  . Elevated CPK    CPK elevated with normal MB and normal troponin the past  . Heart murmur   . History of echocardiogram    Echo 8/16: EF 55%, normal wall motion, grade 2 diastolic dysfunction, trivial MR, normal RV function, PASP 27 mmHg  . Hypertension   . IBS (irritable bowel syndrome)   .  Myocardial infarction ([place]) 06/2014  . Sinus drainage   . Type II diabetes mellitus ([place])      Social History   Socioeconomic History  . Marital status: Married    Spouse name: Not on file  . Number of children: Not on file  . Years of education: Not on file  . Highest education level: Not on file  Occupational History  . Not on file  Tobacco Use  . Smoking status: Never Smoker  . Smokeless tobacco: Never Used  Vaping Use  . Vaping Use: Never used  Substance and  Sexual Activity  . Alcohol use: No  . Drug use: No  . Sexual activity: Not on file  Other Topics Concern  . Not on file  Social History Narrative  . Not on file   Social Determinants of Health   Financial Resource Strain:   . Difficulty of Paying Living Expenses: Not on file  Food Insecurity:   . Worried About Charity fundraiser in the Last Year: Not on file  . Ran Out of Food in the Last Year: Not on file  Transportation Needs:   . Lack of Transportation (Medical): Not on file  . Lack of Transportation (Non-Medical): Not on file  Physical Activity:   . Days of Exercise per Week: Not on file  . Minutes of Exercise per Session: Not on file  Stress:   . Feeling of Stress : Not on file  Social Connections:   . Frequency of Communication with Friends and Family: Not on file  . Frequency of Social Gatherings with Friends and Family: Not on file  . Attends Religious Services: Not on file  . Active Member of Clubs or Organizations: Not on file  . Attends Archivist Meetings: Not on file  . Marital Status: Not on file  Intimate Partner Violence:   . Fear of Current or Ex-Partner: Not on file  . Emotionally Abused: Not on file  . Physically Abused: Not on file  . Sexually Abused: Not on file    Past Surgical History:  Procedure Laterality Date  . APPENDECTOMY    . CARDIAC CATHETERIZATION N/A 05/12/2014   Procedure: Left Heart Cath and Coronary Angiography;  Surgeon: Troy Sine, MD;  Location: [facility];  Service: Cardiovascular;  Laterality: N/A;  . CARDIAC CATHETERIZATION N/A 05/13/2014   Procedure: Coronary/Graft Atherectomy;  Surgeon: Troy Sine, MD;  Location: [facility];  Service: Cardiovascular;  Laterality: N/A;  . CARDIAC CATHETERIZATION Right 05/13/2014   Procedure: Temporary Pacemaker;  Surgeon: Troy Sine, MD;  Location: [facility];  Service: Cardiovascular;  Laterality: Right;  . CARDIAC CATHETERIZATION N/A 05/13/2014    Procedure: Coronary Stent Intervention;  Surgeon: Troy Sine, MD;  Location: [facility];  Service: Cardiovascular;  Laterality: N/A;  . CARDIAC CATHETERIZATION N/A 06/18/2014   Procedure: Left Heart Cath and Coronary Angiography;  Surgeon: Peter M Martinique, MD;  Location: [facility];  Service: Cardiovascular;  Laterality: N/A;  . CARDIAC CATHETERIZATION N/A 08/03/2015   Procedure: Left Heart Cath and Cors/Grafts Angiography;  Surgeon: Nelva Bush, MD;  Location: [facility];  Service: Cardiovascular;  Laterality: N/A;  . CARDIOVASCULAR STRESS TEST  05/06/2014  . CATARACT EXTRACTION W/ INTRAOCULAR LENS IMPLANT Left   . CORONARY ARTERY BYPASS GRAFT N/A 06/22/2014   Procedure: CORONARY ARTERY BYPASS GRAFTING (CABG) x five, using left internal mammary artery, and right leg greater saphenous vein harvested endoscopically;  Surgeon: Melrose Nakayama, MD;  Location: [facility];  Service: Open Heart Surgery;  Laterality: N/A;  . ICD IMPLANT N/A 01/07/2019   Procedure: ICD IMPLANT;  Surgeon: Thompson Grayer, MD;  Location: [facility];  Service: Cardiovascular;  Laterality: N/A;  . LAPAROSCOPIC CHOLECYSTECTOMY    . LEFT HEART CATH AND CORS/GRAFTS ANGIOGRAPHY N/A 01/06/2019   Procedure: LEFT HEART CATH AND CORS/GRAFTS ANGIOGRAPHY;  Surgeon: Nelva Bush, MD;  Location: [facility];  Service: Cardiovascular;  Laterality: N/A;  . MAZE N/A 06/22/2014   Procedure: MAZE;  Surgeon: Melrose Nakayama, MD;  Location: [facility];  Service: Open Heart Surgery;  Laterality: N/A;  . TEE WITHOUT CARDIOVERSION N/A 06/22/2014   Procedure: TRANSESOPHAGEAL ECHOCARDIOGRAM (TEE);  Surgeon: Melrose Nakayama, MD;  Location: [place];  Service: Open Heart Surgery;  Laterality: N/A;    Family History  Problem Relation Age of Onset  . Alzheimer's disease Mother   . Emphysema Father   . Cancer Brother   . Arrhythmia Sister   . Heart attack Sister   . Heart disease Sister   . Hyperlipidemia  Sister   . Hypertension Sister     Allergies  Allergen Reactions  . Lipitor [Atorvastatin] Other (See Comments)    Myopathy Spring 2006  . Pravastatin Other (See Comments)    LEG CRAMPS  . Simvastatin Other (See Comments)    LEG CRAMPS    Current Outpatient Medications on File Prior to Visit  Medication Sig Dispense Refill  . acetaminophen (TYLENOL) 325 MG tablet Take 1-2 tablets (325-650 mg total) by mouth every 4 (four) hours as needed for mild pain.    Marland Kitchen albuterol (VENTOLIN HFA) 108 (90 Base) MCG/ACT inhaler INHALE 2 PUFFS INTO THE LUNGS EVERY 4 (FOUR) HOURS AS NEEDED FOR WHEEZING OR SHORTNESS OF BREATH (COUGH, SHORTNESS OF BREATH OR WHEEZING.). 18 g 0  . apixaban (ELIQUIS) 5 MG TABS tablet Take 1 tablet (5 mg total) by mouth 2 (two) times daily. (restart on Saturday) 60 tablet 5  . carvedilol (COREG) 6.25 MG tablet TAKE 1 TABLET BY MOUTH TWICE A DAY WITH FOOD 180 tablet 3  . fexofenadine (ALLEGRA) 180 MG tablet TAKE 1 TABLET (180 MG TOTAL) BY MOUTH DAILY. FOR ALLERGIES. 90 tablet 0  . fluorometholone (FML) 0.1 % ophthalmic suspension Place 1 drop into the right eye daily as needed (for eye redness and itching).     . fluticasone (FLONASE) 50 MCG/ACT nasal spray PLACE 1 SPRAY INTO BOTH NOSTRILS 2 (TWO) TIMES DAILY AS NEEDED FOR ALLERGIES OR RHINITIS. 16 mL 2  . insulin NPH Human (NOVOLIN N RELION) 100 UNIT/ML injection Inject 55 units into the skin every morning and 55 units into the skin in the evening. 30 mL 5  . Insulin Pen Needle 31G X 8 MM MISC Use as directed with Lantus. 100 each 5  . insulin regular (NOVOLIN R) 100 units/mL injection Inject 15 Units into the skin 3 (three) times daily with meals as needed.    . isosorbide mononitrate (IMDUR) 30 MG 24 hr tablet TAKE 1 TABLET BY MOUTH EVERY DAY 90 tablet 3  . lisinopril (ZESTRIL) 5 MG tablet TAKE 1 TABLET (5 MG TOTAL) DAILY BY MOUTH. 90 tablet 3  . MAGNESIUM PO Take 1 tablet by mouth daily as needed (for leg cramping).     .  nitroGLYCERIN (NITROSTAT) 0.4 MG SL tablet PLACE 1 TABLET (0.4 MG TOTAL) UNDER THE TONGUE EVERY 5 (FIVE) MINUTES AS NEEDED FOR CHEST PAIN. 25  tablet 0  . rosuvastatin (CRESTOR) 10 MG tablet TAKE 1 TABLET BY MOUTH EVERY DAY 90 tablet 3  . tamsulosin (FLOMAX) 0.4 MG CAPS capsule TAKE 1 CAPSULE (0.4 MG TOTAL) BY MOUTH AS NEEDED. FOR DIFFICULTY URINATING. 90 capsule 1   No current facility-administered medications on file prior to visit.    BP 120/82   Pulse 61   Temp 98.4 F (36.9 C) (Temporal)   Ht 5' 8"  (1.727 m)   Wt 216 lb (98 kg)   SpO2 99%   BMI 32.84 kg/m    Objective:   Physical Exam Cardiovascular:     Rate and Rhythm: Normal rate and regular rhythm.  Pulmonary:     Effort: Pulmonary effort is normal.     Breath sounds: Normal breath sounds.  Musculoskeletal:     Cervical back: Neck supple.  Skin:    General: Skin is warm and dry.  Psychiatric:        Mood and Affect: Mood normal.            Assessment & Plan:

## 2019-11-26 NOTE — Assessment & Plan Note (Signed)
Well controlled in the office today. Continue Imdur 30 mg, carvedilol 6.25 mg BID, lisinopril 5 mg daily.   CMP pending.

## 2019-11-26 NOTE — Patient Instructions (Signed)
We increased your Novolin R to 15 units. Make sure to inject this three times daily BEFORE meals. Do not inject the R if you do not eat.   Continue Novolin N 55 units twice daily.   Continue checking your blood sugar levels.  Appropriate times to check your blood sugar levels are:  -Before any meal (breakfast, lunch, dinner) -Two hours after any meal (breakfast, lunch, dinner) -Bedtime  Record your readings and notify me if you continue to consistently run at or above 150 after 2-3 weeks.   You MUST work on [employer] as discussed.   Stop by the lab prior to leaving today. I will notify you of your results once received.   Please schedule a follow up appointment in 3 months for diabetes check.  It was a pleasure to see you today!   Diabetes Mellitus and Nutrition, Adult When you have diabetes (diabetes mellitus), it is very important to have healthy eating habits because your blood sugar (glucose) levels are greatly affected by what you eat and drink. Eating healthy foods in the appropriate amounts, at about the same times every day, can help you:  Control your blood glucose.  Lower your risk of heart disease.  Improve your blood pressure.  Reach or maintain a healthy weight. Every person with diabetes is different, and each person has different needs for a meal plan. Your health care provider may recommend that you work with a diet and nutrition specialist (dietitian) to make a meal plan that is best for you. Your meal plan may vary depending on factors such as:  The calories you need.  The medicines you take.  Your weight.  Your blood glucose, blood pressure, and cholesterol levels.  Your activity level.  Other health conditions you have, such as heart or kidney disease. How do carbohydrates affect me? Carbohydrates, also called carbs, affect your blood glucose level more than any other type of food. Eating carbs naturally raises the amount of glucose in your blood.  Carb counting is a method for keeping track of how many carbs you eat. Counting carbs is important to keep your blood glucose at a healthy level, especially if you use insulin or take certain oral diabetes medicines. It is important to know how many carbs you can safely have in each meal. This is different for every person. Your dietitian can help you calculate how many carbs you should have at each meal and for each snack. Foods that contain carbs include:  Bread, cereal, rice, pasta, and crackers.  Potatoes and corn.  Peas, beans, and lentils.  Milk and yogurt.  Fruit and juice.  Desserts, such as cakes, cookies, ice cream, and candy. How does alcohol affect me? Alcohol can cause a sudden decrease in blood glucose (hypoglycemia), especially if you use insulin or take certain oral diabetes medicines. Hypoglycemia can be a life-threatening condition. Symptoms of hypoglycemia (sleepiness, dizziness, and confusion) are similar to symptoms of having too much alcohol. If your health care provider says that alcohol is safe for you, follow these guidelines:  Limit alcohol intake to no more than 1 drink per day for nonpregnant women and 2 drinks per day for men. One drink equals 12 oz of beer, 5 oz of wine, or 1 oz of hard liquor.  Do not drink on an empty stomach.  Keep yourself hydrated with water, diet soda, or unsweetened iced tea.  Keep in mind that regular soda, juice, and other mixers may contain a lot of sugar  and must be counted as carbs. What are tips for following this plan?  Reading food labels  Start by checking the serving size on the "Nutrition Facts" label of packaged foods and drinks. The amount of calories, carbs, fats, and other nutrients listed on the label is based on one serving of the item. Many items contain more than one serving per package.  Check the total grams (g) of carbs in one serving. You can calculate the number of servings of carbs in one serving by  dividing the total carbs by 15. For example, if a food has 30 g of total carbs, it would be equal to 2 servings of carbs.  Check the number of grams (g) of saturated and trans fats in one serving. Choose foods that have low or no amount of these fats.  Check the number of milligrams (mg) of salt (sodium) in one serving. Most people should limit total sodium intake to less than 2,300 mg per day.  Always check the nutrition information of foods labeled as "low-fat" or "nonfat". These foods may be higher in added sugar or refined carbs and should be avoided.  Talk to your dietitian to identify your daily goals for nutrients listed on the label. Shopping  Avoid buying canned, premade, or processed foods. These foods tend to be high in fat, sodium, and added sugar.  Shop around the outside edge of the grocery store. This includes fresh fruits and vegetables, bulk grains, fresh meats, and fresh dairy. Cooking  Use low-heat cooking methods, such as baking, instead of high-heat cooking methods like deep frying.  Cook using healthy oils, such as olive, canola, or sunflower oil.  Avoid cooking with butter, cream, or high-fat meats. Meal planning  Eat meals and snacks regularly, preferably at the same times every day. Avoid going long periods of time without eating.  Eat foods high in fiber, such as fresh fruits, vegetables, beans, and whole grains. Talk to your dietitian about how many servings of carbs you can eat at each meal.  Eat 4-6 ounces (oz) of lean protein each day, such as lean meat, chicken, fish, eggs, or tofu. One oz of lean protein is equal to: ? 1 oz of meat, chicken, or fish. ? 1 egg. ?  cup of tofu.  Eat some foods each day that contain healthy fats, such as avocado, nuts, seeds, and fish. Lifestyle  Check your blood glucose regularly.  Exercise regularly as told by your health care provider. This may include: ? 150 minutes of moderate-intensity or vigorous-intensity  exercise each week. This could be brisk walking, biking, or water aerobics. ? Stretching and doing strength exercises, such as yoga or weightlifting, at least 2 times a week.  Take medicines as told by your health care provider.  Do not use any products that contain nicotine or tobacco, such as cigarettes and e-cigarettes. If you need help quitting, ask your health care provider.  Work with a Social worker or diabetes educator to identify strategies to manage stress and any emotional and social challenges. Questions to ask a health care provider  Do I need to meet with a diabetes educator?  Do I need to meet with a dietitian?  What number can I call if I have questions?  When are the best times to check my blood glucose? Where to find more information:  American Diabetes Association: diabetes.org  Academy of Nutrition and Dietetics: www.eatright.[employer] of Diabetes and Digestive and Kidney Diseases (NIH): [employer] Summary  A  healthy meal plan will help you control your blood glucose and maintain a healthy lifestyle.  Working with a diet and nutrition specialist (dietitian) can help you make a meal plan that is best for you.  Keep in mind that carbohydrates (carbs) and alcohol have immediate effects on your blood glucose levels. It is important to count carbs and to use alcohol carefully. This information is not intended to replace advice given to you by your health care provider. Make sure you discuss any questions you have with your health care provider. Document Revised: 12/01/2016 Document Reviewed: 01/24/2016 Elsevier Patient Education  2020 [employer].

## 2019-11-26 NOTE — Assessment & Plan Note (Signed)
Asymptomatic.  Discussed the absolute need to gain better control over diabetes to prevent further cardiac disease.   BP well controlled. Repeat lipid panel pending. Compliant to statin therapy.  Follows with cardiology .

## 2019-11-26 NOTE — Assessment & Plan Note (Signed)
Uncontrolled with A1C today of 9.1. This is improved from 10.1 in June 2021, but continues to remain uncontrolled.   In fact A1C has been above goal for 4-5 years with A1C ranging 9-11.5 since 2018. Discussed this today, also discussed his risk for further cardiac disease, kidney disease, stroke, etc.   He has a poor diet, does not exercise. I strongly suspect that his A1C would improve if he simply changed his diet. I offered a diabetic nutritionist referral for which he declines.  I also strongly advised endocrinology referral for which he continues to decline.   Continue Novolin N 55 units BID. Increase Novolin R 15 units and told him to inject TID rather than BID unless he doesn't eat.   We also discussed to call me if glucose readings remain at or above 150 or higher after 2-3 weeks.

## 2019-11-26 NOTE — Assessment & Plan Note (Signed)
Rhythm and rate regular today. Has pacemaker/defibrilator, recent remote check reviewed.  Follows with electrophysiology. Continue carvedilol 6.25 mg BID and apixaban 5 mg BID.

## 2019-11-26 NOTE — Assessment & Plan Note (Signed)
Doing well on tamsulosin 0.4 mg, continue same. Repeat labs pending.

## 2019-11-26 NOTE — Assessment & Plan Note (Signed)
Repeat lipid panel pending. Continue Crestor 10 mg daily. 

## 2019-11-26 NOTE — Assessment & Plan Note (Signed)
>>  ASSESSMENT AND PLAN FOR CAD (CORONARY ARTERY DISEASE) OF ARTERY BYPASS GRAFT WRITTEN ON 11/26/2019  7:50 AM BY CLARK, KATHERINE K, NP  Asymptomatic.  Discussed the absolute need to gain better control over diabetes to prevent further cardiac disease.   BP well controlled. Repeat lipid panel pending. Compliant to statin therapy.  Follows with cardiology .

## 2019-11-26 NOTE — Assessment & Plan Note (Signed)
Poor diet, no regular aerobic exercise.  Strongly advised both.  Repeat CMP pending. Continue statin therapy.

## 2019-11-26 NOTE — Assessment & Plan Note (Signed)
Following with cardiology and electrophysiology.  Has pacemaker/defibrilator.  Recent remote check reviewed.

## 2019-12-08 NOTE — Progress Notes (Signed)
Message sent thru MyChart 

## 2019-12-09 ENCOUNTER — Encounter: Payer: Self-pay | Admitting: Primary Care

## 2019-12-09 DIAGNOSIS — Z1211 Encounter for screening for malignant neoplasm of colon: Secondary | ICD-10-CM | POA: Diagnosis not present

## 2019-12-09 LAB — COLOGUARD: Cologuard: POSITIVE — AB

## 2019-12-19 ENCOUNTER — Telehealth: Payer: Self-pay | Admitting: Primary Care

## 2019-12-19 ENCOUNTER — Encounter: Payer: Self-pay | Admitting: Primary Care

## 2019-12-19 LAB — COLOGUARD: Cologuard: POSITIVE — AB

## 2019-12-19 NOTE — Telephone Encounter (Signed)
Please notify patient that his Cologuard result came back positive.  Given the positive result he will need a colonoscopy to rule out malignancy or other suspicious reasons for positive result.

## 2019-12-22 ENCOUNTER — Other Ambulatory Visit: Payer: Self-pay

## 2019-12-22 DIAGNOSIS — Z1211 Encounter for screening for malignant neoplasm of colon: Secondary | ICD-10-CM

## 2019-12-22 NOTE — Telephone Encounter (Signed)
Called patient and reviewed results. Requested referral to [facility]. I have started. Will let our office know has not received call in next few days.

## 2019-12-22 NOTE — Telephone Encounter (Signed)
Called patient golfing will not be home until after 3 will try to get him then.

## 2020-01-01 ENCOUNTER — Other Ambulatory Visit: Payer: Self-pay | Admitting: Interventional Cardiology

## 2020-01-07 ENCOUNTER — Ambulatory Visit (INDEPENDENT_AMBULATORY_CARE_PROVIDER_SITE_OTHER): Payer: Medicare Other

## 2020-01-07 DIAGNOSIS — I255 Ischemic cardiomyopathy: Secondary | ICD-10-CM

## 2020-01-07 LAB — CUP PACEART REMOTE DEVICE CHECK
Battery Remaining Longevity: 132 mo
Battery Voltage: 3.03 V
Brady Statistic RV Percent Paced: 0.02 %
Date Time Interrogation Session: 20220105043823
HighPow Impedance: 73 Ohm
Implantable Lead Implant Date: 20210105
Implantable Lead Location: 753860
Implantable Pulse Generator Implant Date: 20210105
Lead Channel Impedance Value: 380 Ohm
Lead Channel Impedance Value: 437 Ohm
Lead Channel Pacing Threshold Amplitude: 0.375 V
Lead Channel Pacing Threshold Pulse Width: 0.4 ms
Lead Channel Sensing Intrinsic Amplitude: 10.875 mV
Lead Channel Sensing Intrinsic Amplitude: 10.875 mV
Lead Channel Setting Pacing Amplitude: 2.5 V
Lead Channel Setting Pacing Pulse Width: 0.4 ms
Lead Channel Setting Sensing Sensitivity: 0.3 mV

## 2020-01-08 ENCOUNTER — Telehealth: Payer: Self-pay | Admitting: Primary Care

## 2020-01-08 NOTE — Telephone Encounter (Signed)
Per Epic, patient has been scheduled with [facility] as of today. No further action is needed.

## 2020-01-08 NOTE — Telephone Encounter (Signed)
Pt called in wanted to know about the set up for colonoscopy .

## 2020-01-08 NOTE — Telephone Encounter (Signed)
Per other phone note app has been made and pt informed

## 2020-01-08 NOTE — Telephone Encounter (Signed)
[place] Night - Client Nonclinical Telephone Record AccessNurse Client [facility] Night - Client Client Site [place] - Night Contact Type Call Who Is Calling Patient / Member / Family / Caregiver Caller Name [name] Phone Number [phone] Patient Name [name] Patient DOB March 21, 1945 Call Type Message Only Information Provided Reason for Call Request to Schedule Office Appointment Initial Comment Caller states that he needs to schedule a colonoscopy. Declined triage. Disp. Time Disposition Final User 01/08/2020 7:58:21 AM General Information Provided Yes Windy Canny Call Closed By: Windy Canny Transaction Date/Time: 01/08/2020 7:56:19 AM (ET)

## 2020-01-09 ENCOUNTER — Ambulatory Visit (INDEPENDENT_AMBULATORY_CARE_PROVIDER_SITE_OTHER): Payer: Medicare Other

## 2020-01-09 DIAGNOSIS — Z95810 Presence of automatic (implantable) cardiac defibrillator: Secondary | ICD-10-CM | POA: Diagnosis not present

## 2020-01-09 DIAGNOSIS — I5022 Chronic systolic (congestive) heart failure: Secondary | ICD-10-CM

## 2020-01-09 NOTE — Telephone Encounter (Signed)
Patient advised that referral was re sent to Dr Benson Norway today.

## 2020-01-09 NOTE — Telephone Encounter (Signed)
Patient called back would like to have referral changed to Dr. Benson Norway. He would like call when changed.

## 2020-01-12 IMAGING — CT CT HEAD W/O CM
4 series · 16 of 47 positions shown, 18 images · non-contrast
Comparison: None.

CLINICAL DATA: Syncope

EXAM:
CT HEAD WITHOUT CONTRAST
TECHNIQUE: Contiguous axial images were obtained from the base of the skull
through the vertex without intravenous contrast.

[Series 3: head wo · axial · 0.49mm/px · z∈[-162,-42]mm · 7 of 33 slices shown, 9 images]
[im 5/33  brain]
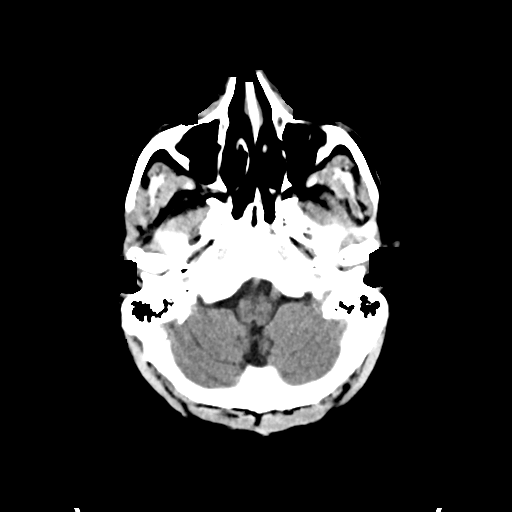
[im 5/33  bone]
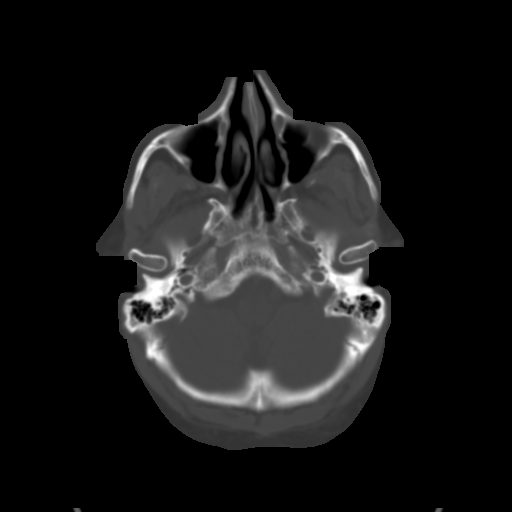
[im 9/33  brain]
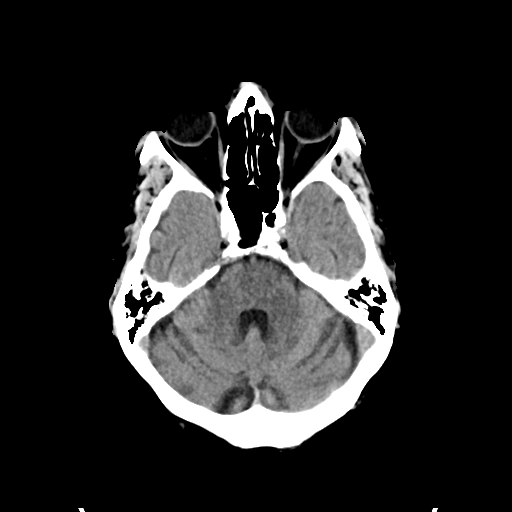
[im 13/33  brain]
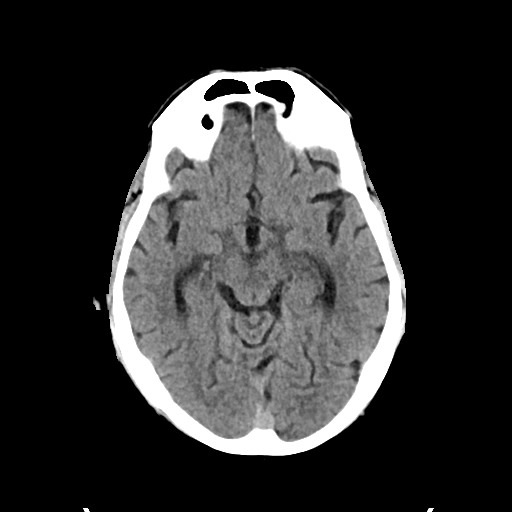
[im 17/33  brain]
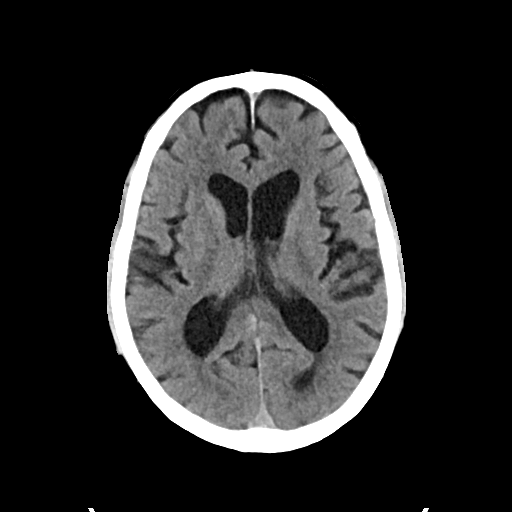
[im 21/33  brain]
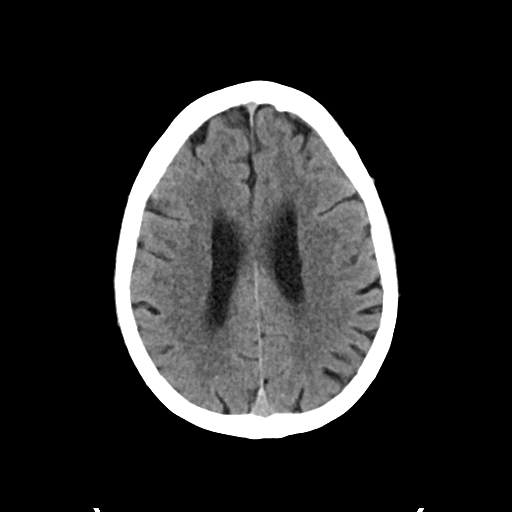
[im 21/33  bone]
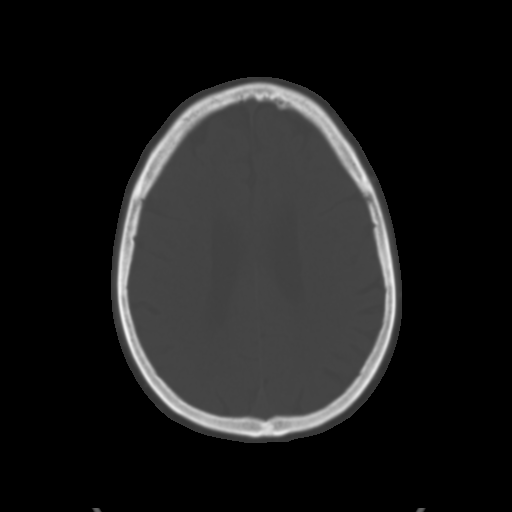
[im 25/33  brain]
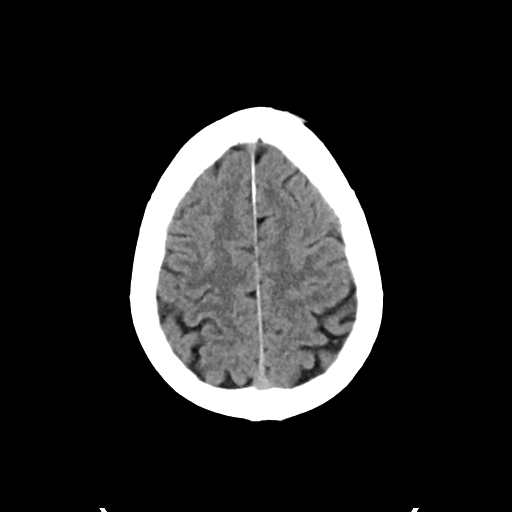
[im 29/33  brain]
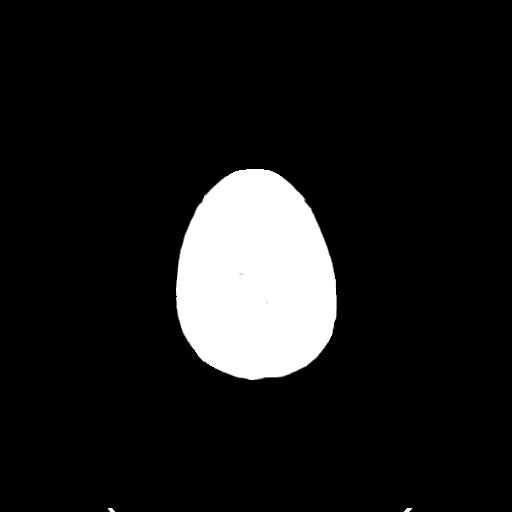

[Series 4: head bone · axial · 0.49mm/px · z∈[-166,-134]mm · 3 of 83 slices shown]
[im 9/83  bone]
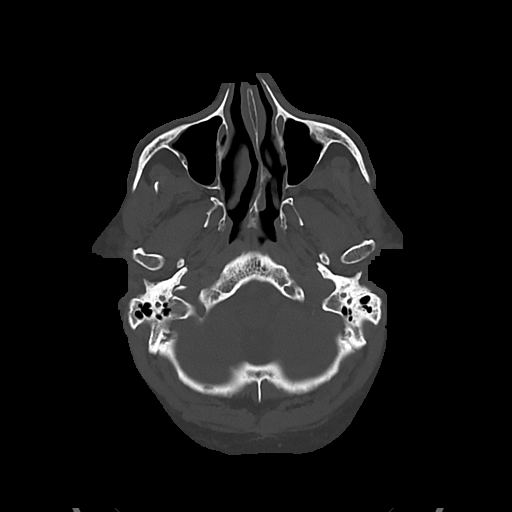
[im 17/83  bone]
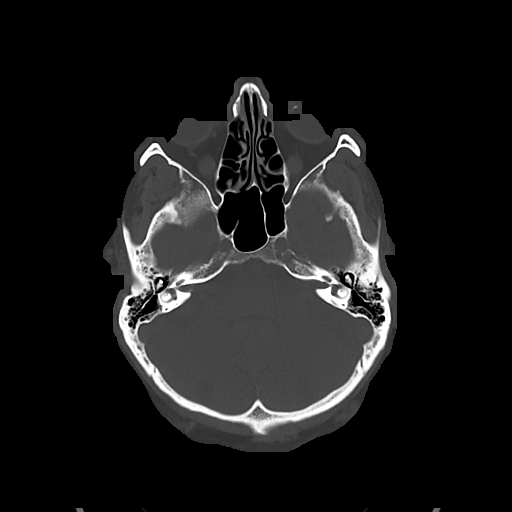
[im 25/83  bone]
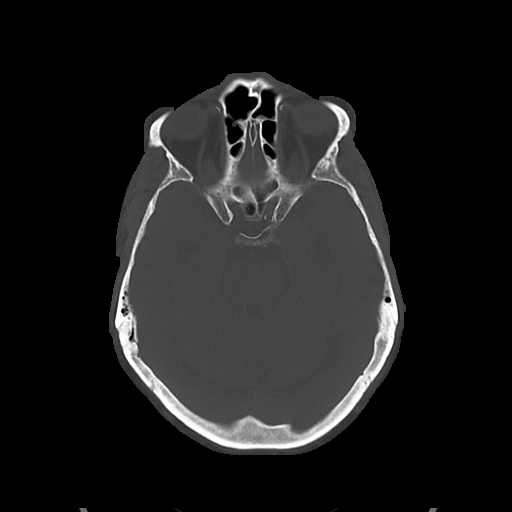

[Series 5: cor soft · coronal · 0.30mm/px · 3 of 72 slices shown]
[im 24/72  brain]
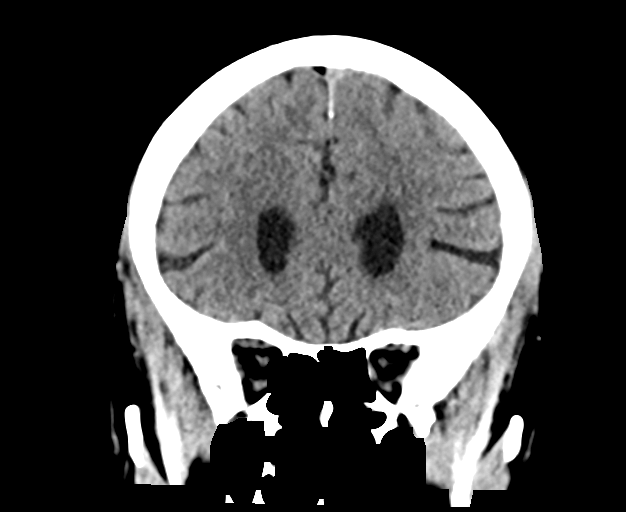
[im 32/72  brain]
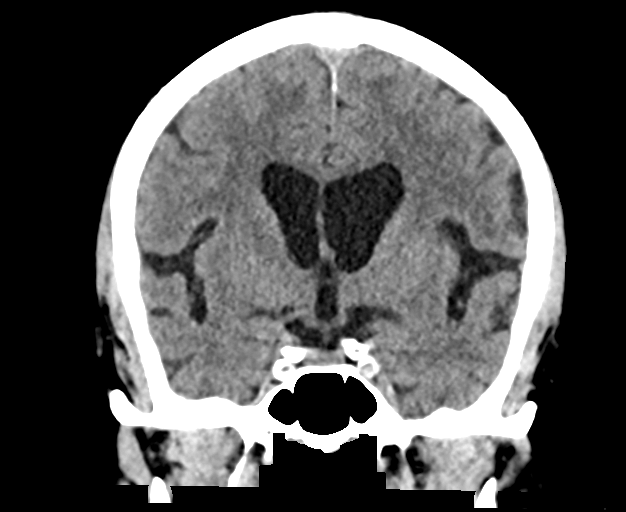
[im 40/72  brain]
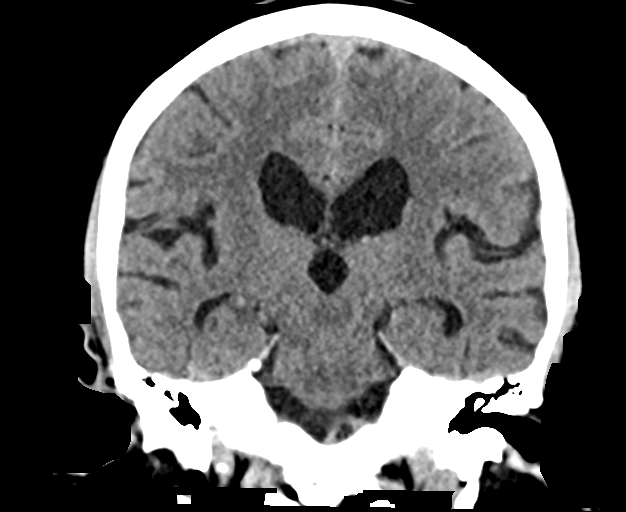

[Series 6: sag soft · sagittal · 0.30mm/px · 3 of 61 slices shown]
[im 21/61  brain]
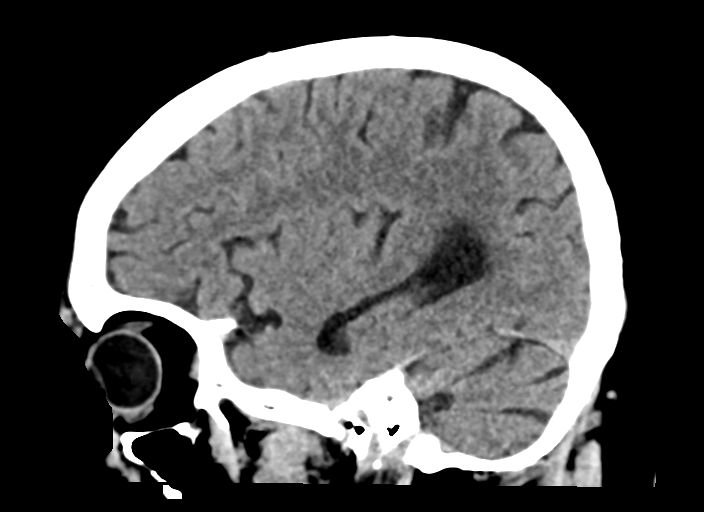
[im 31/61  brain]
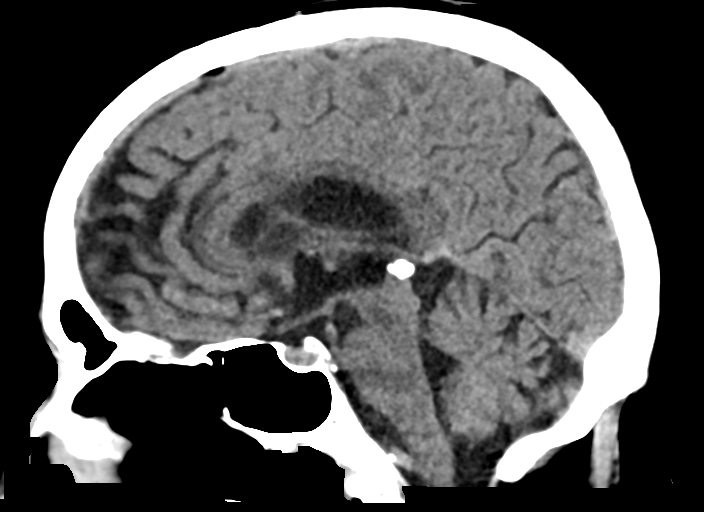
[im 41/61  brain]
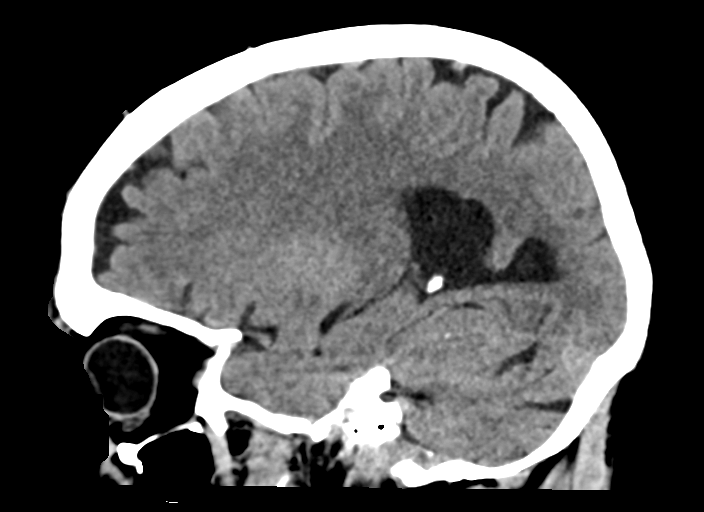

[16 of 47 positions shown; findings below may reference images not displayed]

FINDINGS: Brain: Age related atrophy and minor white matter microvascular
ischemic changes about the lateral ventricles. Remote lacunar type
infarct in the left basal ganglia. No acute intracranial hemorrhage,
mass lesion, new infarction, midline shift, herniation,
hydrocephalus, or extra-axial fluid collection. No focal mass effect
or edema. Gray-white matter differentiation maintained. Cisterns
remain patent. Cerebellar atrophy as well.

Vascular: Intracranial atherosclerosis.  No hyperdense vessel.

Skull: Normal. Negative for fracture or focal lesion.

Sinuses/Orbits: No acute finding.

Other: None.
IMPRESSION: Atrophy and chronic white matter microvascular ischemic changes.

No acute intracranial abnormality by noncontrast CT.

Intracranial atherosclerosis.

## 2020-01-12 NOTE — Progress Notes (Signed)
EPIC Encounter for ICM Monitoring  Patient Name: [name] is a 75 y.o. male Date: 01/12/2020 Primary Care Physican: [name], NP Primary Cardiologist:Varanasi Electrophysiologist:Allred 11/24/2021Weight:216lbs  Time in AF 0.2 hr/day (0.7%)  Transmission reviewed.  Opivol thoracic impedancenormal.  No diuretic prescribed  Recommendations:No changes.  Follow-up plan: ICM clinic phone appointment on3/07/2020(he requests ICM follow every 2 months at this time).91 day device clinic remote transmission 04/07/2020.   EP/Cardiology Office Visits:Recalls for 08/25/2019 with Dr.Varansi and 04/08/2020 with Dr Lovena Le.   Copy of ICM check sent to Dr.Allred    3 month ICM trend: 01/07/2020.    1 Year ICM trend:       [name], RN 01/12/2020 11:21 AM

## 2020-01-14 ENCOUNTER — Other Ambulatory Visit: Payer: Self-pay | Admitting: Gastroenterology

## 2020-01-14 ENCOUNTER — Telehealth: Payer: Self-pay | Admitting: *Deleted

## 2020-01-14 DIAGNOSIS — R195 Other fecal abnormalities: Secondary | ICD-10-CM | POA: Diagnosis not present

## 2020-01-14 DIAGNOSIS — I2510 Atherosclerotic heart disease of native coronary artery without angina pectoris: Secondary | ICD-10-CM | POA: Diagnosis not present

## 2020-01-14 DIAGNOSIS — E119 Type 2 diabetes mellitus without complications: Secondary | ICD-10-CM | POA: Diagnosis not present

## 2020-01-14 DIAGNOSIS — I10 Essential (primary) hypertension: Secondary | ICD-10-CM | POA: Diagnosis not present

## 2020-01-14 NOTE — Telephone Encounter (Signed)
Clinical pharmacist to review Eliquis

## 2020-01-14 NOTE — Telephone Encounter (Signed)
   [facility] Pre-operative Risk Assessment    HEARTCARE STAFF: - Please ensure there is not already an duplicate clearance open for this procedure. - Under Visit Info/Reason for Call, type in Other and utilize the format Clearance MM/DD/YY or Clearance TBD. Do not use dashes or single digits. - If request is for dental extraction, please clarify the # of teeth to be extracted.  Request for surgical clearance:  1. What type of surgery is being performed? COLONOSCOPY   2. When is this surgery scheduled? 02/13/20   3. What type of clearance is required (medical clearance vs. Pharmacy clearance to hold med vs. Both)? BOTH  4. Are there any medications that need to be held prior to surgery and how long? ELIQUIS   5. Practice name and name of physician performing surgery? [place]; DR. HUNG   6. What is the office phone number? [phone]   7.   What is the office fax number? [phone]  8.   Anesthesia type (None, local, MAC, general) ? PROPOFOL   [name] 01/14/2020, 3:18 PM  _________________________________________________________________   (provider comments below)

## 2020-01-15 NOTE — Telephone Encounter (Signed)
Patient with diagnosis of afib on Eliquis for anticoagulation.    Procedure: colonoscopy Date of procedure: 02/13/20  CHA2DS2-VASc Score = 5  This indicates a 7.2% annual risk of stroke. The patient's score is based upon: CHF History: Yes HTN History: Yes Diabetes History: Yes Stroke History: No Vascular Disease History: Yes Age Score: 1 Gender Score: 0  CrCl 27m/min Platelet count 207K  Per office protocol, patient can hold Eliquis for 1-2 days prior to procedure.

## 2020-01-15 NOTE — Telephone Encounter (Signed)
   Primary Cardiologist: Larae Grooms, MD  Chart reviewed as part of pre-operative protocol coverage. Patient was contacted 01/15/2020 in reference to pre-operative risk assessment for pending surgery as outlined below.  GREGROY DOMBKOWSKI was last seen on 04/11/2019 by Dr. Rayann Heman.  Since that day, JOSAFAT ENRICO has done well without chest pain or shortness of breath.  Therefore, based on ACC/AHA guidelines, the patient would be at acceptable risk for the planned procedure without further cardiovascular testing.   Patient will need to hold Eliquis for 2 days prior to the procedure and restart as soon as possible after the procedure at the discretion of GI physician based on the bleeding risk.  The patient was advised that if he develops new symptoms prior to surgery to contact our office to arrange for a follow-up visit, and he verbalized understanding.  I will route this recommendation to the requesting party via Epic fax function and remove from pre-op pool. Please call with questions.  [place], [place] 01/15/2020, 2:10 PM

## 2020-01-21 NOTE — Progress Notes (Signed)
Remote ICD transmission.   

## 2020-01-25 ENCOUNTER — Other Ambulatory Visit: Payer: Self-pay | Admitting: Student

## 2020-01-26 NOTE — Telephone Encounter (Signed)
Eliquis 11m refill request received. Patient is 75years old, weight-98kg, Crea-1.20 on 11/26/2019, Diagnosis-Afib, and last seen by Dr. ARayann Heman4/09/2019. Dose is appropriate based on dosing criteria. Will send in refill to requested pharmacy.

## 2020-02-06 ENCOUNTER — Other Ambulatory Visit: Payer: Self-pay

## 2020-02-10 ENCOUNTER — Other Ambulatory Visit (HOSPITAL_COMMUNITY)
Admission: RE | Admit: 2020-02-10 | Discharge: 2020-02-10 | Disposition: A | Discharge status: Home / Self Care | Payer: Medicare Other | Source: Ambulatory Visit | Attending: Gastroenterology | Admitting: Gastroenterology

## 2020-02-10 DIAGNOSIS — Z01812 Encounter for preprocedural laboratory examination: Secondary | ICD-10-CM | POA: Diagnosis not present

## 2020-02-10 DIAGNOSIS — Z20822 Contact with and (suspected) exposure to covid-19: Secondary | ICD-10-CM | POA: Insufficient documentation

## 2020-02-10 LAB — SARS CORONAVIRUS 2 (TAT 6-24 HRS): SARS Coronavirus 2: NEGATIVE

## 2020-02-11 ENCOUNTER — Other Ambulatory Visit: Payer: Self-pay | Admitting: Interventional Cardiology

## 2020-02-13 ENCOUNTER — Encounter (HOSPITAL_COMMUNITY)
Admission: RE | Disposition: A | Discharge status: Home / Self Care | Payer: Self-pay | Source: Home / Self Care | Attending: Gastroenterology

## 2020-02-13 ENCOUNTER — Ambulatory Visit (HOSPITAL_COMMUNITY): Payer: Medicare Other | Admitting: Anesthesiology

## 2020-02-13 ENCOUNTER — Encounter (HOSPITAL_COMMUNITY): Payer: Self-pay | Admitting: Gastroenterology

## 2020-02-13 ENCOUNTER — Ambulatory Visit (HOSPITAL_COMMUNITY)
Admission: RE | Admit: 2020-02-13 | Discharge: 2020-02-13 | Disposition: A | Discharge status: Home / Self Care | Payer: Medicare Other | Attending: Gastroenterology | Admitting: Gastroenterology

## 2020-02-13 ENCOUNTER — Other Ambulatory Visit: Payer: Self-pay

## 2020-02-13 DIAGNOSIS — I10 Essential (primary) hypertension: Secondary | ICD-10-CM | POA: Insufficient documentation

## 2020-02-13 DIAGNOSIS — Z86010 Personal history of colonic polyps: Secondary | ICD-10-CM | POA: Diagnosis not present

## 2020-02-13 DIAGNOSIS — R195 Other fecal abnormalities: Secondary | ICD-10-CM | POA: Insufficient documentation

## 2020-02-13 DIAGNOSIS — Z8249 Family history of ischemic heart disease and other diseases of the circulatory system: Secondary | ICD-10-CM | POA: Diagnosis not present

## 2020-02-13 DIAGNOSIS — Z950 Presence of cardiac pacemaker: Secondary | ICD-10-CM | POA: Diagnosis not present

## 2020-02-13 DIAGNOSIS — Z888 Allergy status to other drugs, medicaments and biological substances status: Secondary | ICD-10-CM | POA: Insufficient documentation

## 2020-02-13 DIAGNOSIS — I252 Old myocardial infarction: Secondary | ICD-10-CM | POA: Insufficient documentation

## 2020-02-13 DIAGNOSIS — Z9049 Acquired absence of other specified parts of digestive tract: Secondary | ICD-10-CM | POA: Insufficient documentation

## 2020-02-13 DIAGNOSIS — Z955 Presence of coronary angioplasty implant and graft: Secondary | ICD-10-CM | POA: Diagnosis not present

## 2020-02-13 DIAGNOSIS — I480 Paroxysmal atrial fibrillation: Secondary | ICD-10-CM | POA: Insufficient documentation

## 2020-02-13 DIAGNOSIS — E119 Type 2 diabetes mellitus without complications: Secondary | ICD-10-CM | POA: Insufficient documentation

## 2020-02-13 DIAGNOSIS — D123 Benign neoplasm of transverse colon: Secondary | ICD-10-CM | POA: Diagnosis not present

## 2020-02-13 DIAGNOSIS — K635 Polyp of colon: Secondary | ICD-10-CM | POA: Diagnosis not present

## 2020-02-13 DIAGNOSIS — J45909 Unspecified asthma, uncomplicated: Secondary | ICD-10-CM | POA: Diagnosis not present

## 2020-02-13 DIAGNOSIS — I2510 Atherosclerotic heart disease of native coronary artery without angina pectoris: Secondary | ICD-10-CM | POA: Diagnosis not present

## 2020-02-13 HISTORY — PX: COLONOSCOPY WITH PROPOFOL: SHX5780

## 2020-02-13 HISTORY — PX: POLYPECTOMY: SHX5525

## 2020-02-13 LAB — GLUCOSE, CAPILLARY: Glucose-Capillary: 240 mg/dL — ABNORMAL HIGH (ref 70–99)

## 2020-02-13 SURGERY — COLONOSCOPY WITH PROPOFOL
Anesthesia: Monitor Anesthesia Care

## 2020-02-13 MED ORDER — SODIUM CHLORIDE 0.9 % IV SOLN: INTRAVENOUS | Status: DC

## 2020-02-13 MED ORDER — PROPOFOL 10 MG/ML IV BOLUS
INTRAVENOUS | Status: AC
  Filled 2020-02-13: qty 20

## 2020-02-13 MED ORDER — PHENYLEPHRINE 40 MCG/ML (10ML) SYRINGE FOR IV PUSH (FOR BLOOD PRESSURE SUPPORT)
PREFILLED_SYRINGE | INTRAVENOUS | Status: DC | PRN
  Administered 2020-02-13 (×7): 80 ug via INTRAVENOUS

## 2020-02-13 MED ORDER — LIDOCAINE 2% (20 MG/ML) 5 ML SYRINGE
INTRAMUSCULAR | Status: DC | PRN
  Administered 2020-02-13: 100 mg via INTRAVENOUS

## 2020-02-13 MED ORDER — LACTATED RINGERS IV SOLN
INTRAVENOUS | Status: DC
  Administered 2020-02-13: 1000 mL via INTRAVENOUS

## 2020-02-13 MED ORDER — PROPOFOL 10 MG/ML IV BOLUS
INTRAVENOUS | Status: DC | PRN
  Administered 2020-02-13: 20 mg via INTRAVENOUS
  Administered 2020-02-13: 50 mg via INTRAVENOUS
  Administered 2020-02-13 (×3): 20 mg via INTRAVENOUS
  Administered 2020-02-13: 30 mg via INTRAVENOUS
  Administered 2020-02-13: 20 mg via INTRAVENOUS

## 2020-02-13 SURGICAL SUPPLY — 21 items

## 2020-02-13 NOTE — Discharge Instructions (Signed)

## 2020-02-13 NOTE — Anesthesia Preprocedure Evaluation (Addendum)
Anesthesia Evaluation  Patient identified by MRN, date of birth, ID band Patient awake    Reviewed: Allergy & Precautions, NPO status , Patient's Chart, lab work & pertinent test results, reviewed documented beta blocker date and time   Airway Mallampati: I       Dental  (+) Edentulous Upper, Partial Lower, Poor Dentition   Pulmonary asthma ,    Pulmonary exam normal        Cardiovascular hypertension, Pt. on medications and Pt. on home beta blockers + CAD and + Cardiac Stents  Normal cardiovascular exam+ dysrhythmias Atrial Fibrillation      Neuro/Psych negative neurological ROS  negative psych ROS   GI/Hepatic   Endo/Other  diabetes, Poorly Controlled, Type 1, Insulin Dependent  Renal/GU   negative genitourinary   Musculoskeletal   Abdominal (+) + obese,   Peds  Hematology   Anesthesia Other Findings     Remote device check reviewed. Normal device function. Battery status, leads stable. Histograms reviewed and appropriate.  Routine follow-up    Reproductive/Obstetrics                            Anesthesia Physical Anesthesia Plan  ASA: III  Anesthesia Plan: MAC   Post-op Pain Management:    Induction:   PONV Risk Score and Plan: 1 and Propofol infusion and TIVA  Airway Management Planned: Natural Airway and Simple Face Mask  Additional Equipment: None  Intra-op Plan:   Post-operative Plan:   Informed Consent: I have reviewed the patients History and Physical, chart, labs and discussed the procedure including the risks, benefits and alternatives for the proposed anesthesia with the patient or authorized representative who has indicated his/her understanding and acceptance.     Dental advisory given  Plan Discussed with: CRNA  Anesthesia Plan Comments:         Anesthesia Quick Evaluation

## 2020-02-13 NOTE — H&P (Addendum)
[name]   HPI: His colonoscopy on 04/21/2008 was positive for an adenoma. With routine testing the patient was identified to have a positive Cologuard test. The patient denies any issues with hematochezia, melena, or abdominal pain. He does take Eliquis for his CAD and he is s/p CABG x 5 vessels. Last January (01/2019) he suffered with a syncopal episode, but no overt source was identified. The thought that it was secondary to a cardiac source he is is s/p pacemaker/AICD.     Past Medical History:  Diagnosis Date  . Arthritis    "fingers, shoulders" (08/03/2015)  . Atrial fibrillation (HCC)    Paroxysmal, rare episodes, sinus rhythm on flecainide, patient prefers not to take Coumadin  . Bradycardia    April, 2013  . Chest pain    Nuclear, 2006, no ischemia  . Chronic lower back pain    "all my life"  . Coronary artery disease    s/p staged cath 05/12/2014 and 5/11, DES x 2 to heavily calcified RCA, residual with 60% prox LAD, 70% D1  . Ejection fraction    EF 55-60%,  echo, January, 2011  . Elevated CPK    CPK elevated with normal MB and normal troponin the past  . Heart murmur   . History of echocardiogram    Echo 8/16: EF 55%, normal wall motion, grade 2 diastolic dysfunction, trivial MR, normal RV function, PASP 27 mmHg  . Hypertension   . IBS (irritable bowel syndrome)   . Myocardial infarction ([place]) 06/2014  . Sinus drainage   . Type II diabetes mellitus ([place])     Past Surgical History:  Procedure Laterality Date  . APPENDECTOMY    . CARDIAC CATHETERIZATION N/A 05/12/2014   Procedure: Left Heart Cath and Coronary Angiography;  Surgeon: Troy Sine, MD;  Location: [facility];  Service: Cardiovascular;  Laterality: N/A;  . CARDIAC CATHETERIZATION N/A 05/13/2014   Procedure: Coronary/Graft Atherectomy;  Surgeon: Troy Sine, MD;  Location: [place] [facility];  Service: Cardiovascular;  Laterality: N/A;  . CARDIAC CATHETERIZATION Right 05/13/2014    Procedure: Temporary Pacemaker;  Surgeon: Troy Sine, MD;  Location: [place] [facility];  Service: Cardiovascular;  Laterality: Right;  . CARDIAC CATHETERIZATION N/A 05/13/2014   Procedure: Coronary Stent Intervention;  Surgeon: Troy Sine, MD;  Location: [facility] [facility];  Service: Cardiovascular;  Laterality: N/A;  . CARDIAC CATHETERIZATION N/A 06/18/2014   Procedure: Left Heart Cath and Coronary Angiography;  Surgeon: Peter M Martinique, MD;  Location: [facility] [place];  Service: Cardiovascular;  Laterality: N/A;  . CARDIAC CATHETERIZATION N/A 08/03/2015   Procedure: Left Heart Cath and Cors/Grafts Angiography;  Surgeon: Nelva Bush, MD;  Location: [place] [facility];  Service: Cardiovascular;  Laterality: N/A;  . CARDIOVASCULAR STRESS TEST  05/06/2014  . CATARACT EXTRACTION W/ INTRAOCULAR LENS IMPLANT Left   . CORONARY ARTERY BYPASS GRAFT N/A 06/22/2014   Procedure: CORONARY ARTERY BYPASS GRAFTING (CABG) x five, using left internal mammary artery, and right leg greater saphenous vein harvested endoscopically;  Surgeon: Melrose Nakayama, MD;  Location: [place];  Service: Open Heart Surgery;  Laterality: N/A;  . ICD IMPLANT N/A 01/07/2019   Procedure: ICD IMPLANT;  Surgeon: Thompson Grayer, MD;  Location: [facility];  Service: Cardiovascular;  Laterality: N/A;  . LAPAROSCOPIC CHOLECYSTECTOMY    . LEFT HEART CATH AND CORS/GRAFTS ANGIOGRAPHY N/A 01/06/2019   Procedure: LEFT HEART CATH AND CORS/GRAFTS ANGIOGRAPHY;  Surgeon: Nelva Bush,  MD;  Location: [facility];  Service: Cardiovascular;  Laterality: N/A;  . MAZE N/A 06/22/2014   Procedure: MAZE;  Surgeon: Melrose Nakayama, MD;  Location: [place];  Service: Open Heart Surgery;  Laterality: N/A;  . TEE WITHOUT CARDIOVERSION N/A 06/22/2014   Procedure: TRANSESOPHAGEAL ECHOCARDIOGRAM (TEE);  Surgeon: Melrose Nakayama, MD;  Location: [place];  Service: Open Heart Surgery;  Laterality: N/A;    Family History  Problem  Relation Age of Onset  . Alzheimer's disease Mother   . Emphysema Father   . Cancer Brother   . Arrhythmia Sister   . Heart attack Sister   . Heart disease Sister   . Hyperlipidemia Sister   . Hypertension Sister     Social History:  reports that he has never smoked. He has never used smokeless tobacco. He reports that he does not drink alcohol and does not use drugs.  Allergies:  Allergies  Allergen Reactions  . Lipitor [Atorvastatin] Other (See Comments)    Myopathy Spring 2006  . Pravastatin Other (See Comments)    LEG CRAMPS  . Simvastatin Other (See Comments)    LEG CRAMPS    Medications: Scheduled: Continuous:  No results found for this or any previous visit (from the past 24 hour(s)).   No results found.  ROS:  As stated above in the HPI otherwise negative.  Height 5' 8"  (1.727 m), weight 95.3 kg.    PE: Gen: NAD, Alert and Oriented HEENT:  [place]/AT, EOMI Neck: Supple, no LAD Lungs: CTA Bilaterally CV: RRR without M/G/R ABD: Soft, NTND, +BS Ext: No C/C/E  Assessment/Plan: 1) + Cologuard test - colonoscopy.  Chellie Vanlue D 02/13/2020, 8:36 AM

## 2020-02-13 NOTE — Anesthesia Postprocedure Evaluation (Signed)
Anesthesia Post Note  Patient: [name]  Procedure(s) Performed: COLONOSCOPY WITH PROPOFOL (N/A ) POLYPECTOMY     Patient location during evaluation: Endoscopy Anesthesia Type: MAC Level of consciousness: awake Pain management: pain level controlled Vital Signs Assessment: post-procedure vital signs reviewed and stable Respiratory status: spontaneous breathing Cardiovascular status: stable Postop Assessment: no apparent nausea or vomiting Anesthetic complications: no   No complications documented.  Last Vitals:  Vitals:   02/13/20 1040 02/13/20 1051  BP: 130/76 135/70  Pulse: 63 65  Resp: (!) 23 (!) 21  Temp: 36.8 C   SpO2: 98% 100%    Last Pain:  Vitals:   02/13/20 1051  TempSrc:   PainSc: 0-No pain                 John F Salome Arnt

## 2020-02-13 NOTE — Anesthesia Procedure Notes (Signed)
Date/Time: 02/13/2020 10:00 AM Performed by: Talbot Grumbling, CRNA Oxygen Delivery Method: Simple face mask

## 2020-02-13 NOTE — Transfer of Care (Signed)
Immediate Anesthesia Transfer of Care Note  Patient: [name]  Procedure(s) Performed: COLONOSCOPY WITH PROPOFOL (N/A ) POLYPECTOMY  Patient Location: PACU  Anesthesia Type:MAC  Level of Consciousness: sedated  Airway & Oxygen Therapy: Patient Spontanous Breathing and Patient connected to face mask oxygen  Post-op Assessment: Report given to RN and Post -op Vital signs reviewed and stable  Post vital signs: Reviewed and stable  Last Vitals:  Vitals Value Taken Time  BP 109/52 02/13/20 1028  Temp    Pulse 57 02/13/20 1029  Resp 21 02/13/20 1029  SpO2 100 % 02/13/20 1029  Vitals shown include unvalidated device data.  Last Pain:  Vitals:   02/13/20 0850  TempSrc: Oral  PainSc: 0-No pain         Complications: No complications documented.

## 2020-02-13 NOTE — Op Note (Addendum)
[facility] Patient Name: [name] Procedure Date: 02/13/2020 MRN: [id number] Attending MD: Carol Ada , MD Date of Birth: 1945/11/29 CSN: [id number] Age: 75 Admit Type: Outpatient Procedure:                Colonoscopy Indications:              Positive Cologuard test Providers:                Carol Ada, MD, Cleda Daub, RN, Fransico Setters                            Mbumina, Technician Referring MD:              Medicines:                 Complications:            No immediate complications. Estimated Blood Loss:     Estimated blood loss: none. Procedure:                Pre-Anesthesia Assessment:                           - Prior to the procedure, a History and Physical                            was performed, and patient medications and                            allergies were reviewed. The patient's tolerance of                            previous anesthesia was also reviewed. The risks                            and benefits of the procedure and the sedation                            options and risks were discussed with the patient.                            All questions were answered, and informed consent                            was obtained. Prior Anticoagulants: The patient has                            taken Eliquis (apixaban), last dose was 5 days                            prior to procedure. ASA Grade Assessment: III - A                            patient with severe systemic disease. After                            reviewing the risks  and benefits, the patient was                            deemed in satisfactory condition to undergo the                            procedure.                           - Sedation was administered by an anesthesia                            professional. Deep sedation was attained.                           After obtaining informed consent, the colonoscope                            was passed under direct  vision. Throughout the                            procedure, the patient's blood pressure, pulse, and                            oxygen saturations were monitored continuously. The                            CF-HQ190L ([id number] Olympus colonoscope was                            introduced through the anus and advanced to the the                            cecum, identified by appendiceal orifice and                            ileocecal valve. The colonoscopy was performed                            without difficulty. The patient tolerated the                            procedure well. The quality of the bowel                            preparation was good. The ileocecal valve,                            appendiceal orifice, and rectum were photographed. Scope In: 10:03:58 AM Scope Out: 10:21:38 AM Scope Withdrawal Time: 0 hours 13 minutes 14 seconds  Total Procedure Duration: 0 hours 17 minutes 40 seconds  Findings:      Three sessile polyps were found in the transverse colon. The polyps were       2 to 4 mm in size. These polyps were removed with a cold snare.  Resection and retrieval were complete. Impression:               - Three 2 to 4 mm polyps in the transverse colon,                            removed with a cold snare. Resected and retrieved. Moderate Sedation:      Not Applicable - Patient had care per Anesthesia. Recommendation:           - Patient has a contact number available for                            emergencies. The signs and symptoms of potential                            delayed complications were discussed with the                            patient. Return to normal activities tomorrow.                            Written discharge instructions were provided to the                            patient.                           - Resume previous diet.                           - Continue present medications.                           - Await pathology  results.                           - Repeat colonoscopy in 5 years for surveillance,                            if clinically appropriate.                           - Resume Eliquis. Procedure Code(s):        --- Professional ---                           [phone], Colonoscopy, flexible; with removal of                            tumor(s), polyp(s), or other lesion(s) by snare                            technique Diagnosis Code(s):        --- Professional ---                           K63.5, Polyp of colon  Z86.010, Personal history of colonic polyps CPT copyright 2019 [employer] Association. All rights reserved. The codes documented in this report are preliminary and upon coder review may  be revised to meet current compliance requirements. Carol Ada, MD Carol Ada, MD 02/13/2020 10:27:55 AM This report has been signed electronically. Number of Addenda: 0

## 2020-02-16 ENCOUNTER — Encounter (HOSPITAL_COMMUNITY): Payer: Self-pay | Admitting: Gastroenterology

## 2020-02-16 LAB — SURGICAL PATHOLOGY

## 2020-02-17 ENCOUNTER — Ambulatory Visit: Payer: Medicare Other | Admitting: Gastroenterology

## 2020-02-26 ENCOUNTER — Other Ambulatory Visit: Payer: Self-pay

## 2020-02-26 ENCOUNTER — Encounter: Payer: Self-pay | Admitting: Primary Care

## 2020-02-26 ENCOUNTER — Ambulatory Visit (INDEPENDENT_AMBULATORY_CARE_PROVIDER_SITE_OTHER): Payer: Medicare Other | Admitting: Primary Care

## 2020-02-26 VITALS — BP 134/78 | HR 71 | Temp 97.3°F | Ht 68.0 in | Wt 205.0 lb

## 2020-02-26 DIAGNOSIS — I255 Ischemic cardiomyopathy: Secondary | ICD-10-CM

## 2020-02-26 DIAGNOSIS — Z794 Long term (current) use of insulin: Secondary | ICD-10-CM | POA: Diagnosis not present

## 2020-02-26 DIAGNOSIS — E1159 Type 2 diabetes mellitus with other circulatory complications: Secondary | ICD-10-CM

## 2020-02-26 LAB — POCT GLYCOSYLATED HEMOGLOBIN (HGB A1C): Hemoglobin A1C: 9.5 % — AB (ref 4.0–5.6)

## 2020-02-26 MED ORDER — INSULIN REGULAR HUMAN 100 UNIT/ML IJ SOLN: 25.0000 [IU] | Freq: Two times a day (BID) | INTRAMUSCULAR | 3 refills | Status: DC

## 2020-02-26 MED ORDER — GLIPIZIDE ER 10 MG PO TB24: 10.0000 mg | ORAL_TABLET | Freq: Every day | ORAL | 3 refills | Status: DC

## 2020-02-26 NOTE — Assessment & Plan Note (Signed)
Remains uncontrolled, A1c of 9.5 today which is higher than last check.  He is compliant to his diabetes regimen.  Long discussion today about the need to gain control over his diabetes, and I strongly recommended endocrinology evaluation for which he continues to decline.  Continue Novolin NPH 55 units twice daily, increase Novolin R to 25 units twice daily with meals (he often skips lunch).  He cannot afford numerous oral medications and injectable medications.  Add glipizide XL 10 mg daily.  Strongly encouraged healthy diet, weight loss, exercise.  Also recommended he notify me if his glucose readings continue to remain at or above 150 consistently after a few weeks.  Follow-up in 3 months.

## 2020-02-26 NOTE — Progress Notes (Signed)
Subjective:    Patient ID: [name], male    DOB: Mar 07, 1945, 75 y.o.   MRN: [id number]  HPI  This visit occurred during the SARS-CoV-2 public health emergency.  Safety protocols were in place, including screening questions prior to the visit, additional usage of staff PPE, and extensive cleaning of exam room while observing appropriate contact time as indicated for disinfecting solutions.   [name] is a 75 year old male with a significant medical history including uncontrolled type 2 diabetes, CAD, hypertension, cardiomyopathy, [name], BPH who presents today for follow-up of diabetes and repeat lipid panel.  Current medications include: Novolin NPH 55 units twice daily, Novolin R 15 units twice daily. He is actually injecting 20 units BID  He is checking his blood glucose 3-4 times daily and is getting readings of:  AM fasting: mid to high 100's Before lunch: mid to high 100's, some 200's Bedtime: mid to high 100's, some 200's  Last A1C: 9.1 in November 2021, Last Eye Exam: UTD Last Foot Exam: Up-to-date Pneumonia Vaccination: 2018 ACE/ARB: Lisinopril Statin: Could not tolerate, declines   BP Readings from Last 3 Encounters:  02/26/20 134/78  02/13/20 135/70  11/26/19 120/82     Review of Systems  Eyes: Negative for visual disturbance.  Respiratory: Negative for shortness of breath.   Cardiovascular: Negative for chest pain.  Neurological: Negative for dizziness and headaches.       Past Medical History:  Diagnosis Date  . Arthritis    "fingers, shoulders" (08/03/2015)  . Atrial fibrillation (HCC)    Paroxysmal, rare episodes, sinus rhythm on flecainide, patient prefers not to take Coumadin  . Bradycardia    April, 2013  . Chest pain    Nuclear, 2006, no ischemia  . Chronic lower back pain    "all my life"  . Coronary artery disease    s/p staged cath 05/12/2014 and 5/11, DES x 2 to heavily calcified RCA, residual with 60% prox LAD, 70% D1  . Ejection  fraction    EF 55-60%,  echo, January, 2011  . Elevated CPK    CPK elevated with normal MB and normal troponin the past  . Heart murmur   . History of echocardiogram    Echo 8/16: EF 55%, normal wall motion, grade 2 diastolic dysfunction, trivial MR, normal RV function, PASP 27 mmHg  . Hypertension   . IBS (irritable bowel syndrome)   . Myocardial infarction ([place]) 06/2014  . Sinus drainage   . Type II diabetes mellitus ([place])      Social History   Socioeconomic History  . Marital status: Married    Spouse name: Not on file  . Number of children: Not on file  . Years of education: Not on file  . Highest education level: Not on file  Occupational History  . Not on file  Tobacco Use  . Smoking status: Never Smoker  . Smokeless tobacco: Never Used  Vaping Use  . Vaping Use: Never used  Substance and Sexual Activity  . Alcohol use: No  . Drug use: No  . Sexual activity: Not on file  Other Topics Concern  . Not on file  Social History Narrative  . Not on file   Social Determinants of Health   Financial Resource Strain: Not on file  Food Insecurity: Not on file  Transportation Needs: Not on file  Physical Activity: Not on file  Stress: Not on file  Social Connections: Not on file  Intimate Partner Violence:  Not on file    Past Surgical History:  Procedure Laterality Date  . APPENDECTOMY    . CARDIAC CATHETERIZATION N/A 05/12/2014   Procedure: Left Heart Cath and Coronary Angiography;  Surgeon: Troy Sine, MD;  Location: [place] [facility];  Service: Cardiovascular;  Laterality: N/A;  . CARDIAC CATHETERIZATION N/A 05/13/2014   Procedure: Coronary/Graft Atherectomy;  Surgeon: Troy Sine, MD;  Location: [place] [facility];  Service: Cardiovascular;  Laterality: N/A;  . CARDIAC CATHETERIZATION Right 05/13/2014   Procedure: Temporary Pacemaker;  Surgeon: Troy Sine, MD;  Location: [place] [facility];  Service: Cardiovascular;  Laterality: Right;  . CARDIAC  CATHETERIZATION N/A 05/13/2014   Procedure: Coronary Stent Intervention;  Surgeon: Troy Sine, MD;  Location: [place] [facility];  Service: Cardiovascular;  Laterality: N/A;  . CARDIAC CATHETERIZATION N/A 06/18/2014   Procedure: Left Heart Cath and Coronary Angiography;  Surgeon: Peter M Martinique, MD;  Location: [place] [facility];  Service: Cardiovascular;  Laterality: N/A;  . CARDIAC CATHETERIZATION N/A 08/03/2015   Procedure: Left Heart Cath and Cors/Grafts Angiography;  Surgeon: Nelva Bush, MD;  Location: [place] [facility];  Service: Cardiovascular;  Laterality: N/A;  . CARDIOVASCULAR STRESS TEST  05/06/2014  . CATARACT EXTRACTION W/ INTRAOCULAR LENS IMPLANT Left   . COLONOSCOPY WITH PROPOFOL N/A 02/13/2020   Procedure: COLONOSCOPY WITH PROPOFOL;  Surgeon: Carol Ada, MD;  Location: [facility];  Service: Endoscopy;  Laterality: N/A;  . CORONARY ARTERY BYPASS GRAFT N/A 06/22/2014   Procedure: CORONARY ARTERY BYPASS GRAFTING (CABG) x five, using left internal mammary artery, and right leg greater saphenous vein harvested endoscopically;  Surgeon: Melrose Nakayama, MD;  Location: [place];  Service: Open Heart Surgery;  Laterality: N/A;  . ICD IMPLANT N/A 01/07/2019   Procedure: ICD IMPLANT;  Surgeon: [place] Grayer, MD;  Location: [place] [facility];  Service: Cardiovascular;  Laterality: N/A;  . LAPAROSCOPIC CHOLECYSTECTOMY    . LEFT HEART CATH AND CORS/GRAFTS ANGIOGRAPHY N/A 01/06/2019   Procedure: LEFT HEART CATH AND CORS/GRAFTS ANGIOGRAPHY;  Surgeon: Nelva Bush, MD;  Location: [place] [facility];  Service: Cardiovascular;  Laterality: N/A;  . MAZE N/A 06/22/2014   Procedure: MAZE;  Surgeon: Melrose Nakayama, MD;  Location: [place];  Service: Open Heart Surgery;  Laterality: N/A;  . POLYPECTOMY  02/13/2020   Procedure: POLYPECTOMY;  Surgeon: Carol Ada, MD;  Location: [facility];  Service: Endoscopy;;  . TEE WITHOUT CARDIOVERSION N/A 06/22/2014   Procedure: TRANSESOPHAGEAL  ECHOCARDIOGRAM (TEE);  Surgeon: Melrose Nakayama, MD;  Location: [place];  Service: Open Heart Surgery;  Laterality: N/A;    Family History  Problem Relation Age of Onset  . Alzheimer's disease Mother   . Emphysema Father   . Cancer Brother   . Arrhythmia Sister   . Heart attack Sister   . Heart disease Sister   . Hyperlipidemia Sister   . Hypertension Sister     Allergies  Allergen Reactions  . Lipitor [Atorvastatin] Other (See Comments)    Myopathy Spring 2006  . Pravastatin Other (See Comments)    LEG CRAMPS  . Simvastatin Other (See Comments)    LEG CRAMPS    Current Outpatient Medications on File Prior to Visit  Medication Sig Dispense Refill  . acetaminophen (TYLENOL) 325 MG tablet Take 1-2 tablets (325-650 mg total) by mouth every 4 (four) hours as needed for mild pain.    Marland Kitchen apixaban (ELIQUIS) 5 MG TABS tablet Take 1 tablet (5 mg  total) by mouth 2 (two) times daily. 60 tablet 5  . carvedilol (COREG) 6.25 MG tablet TAKE 1 TABLET BY MOUTH TWICE A DAY WITH FOOD (Patient taking differently: Take 6.25 mg by mouth 2 (two) times daily with a meal.) 180 tablet 3  . insulin NPH Human (NOVOLIN N RELION) 100 UNIT/ML injection Inject 55 units into the skin every morning and 55 units into the skin in the evening. (Patient taking differently: Inject 55 Units into the skin 2 (two) times daily before a meal.) 30 mL 5  . Insulin Pen Needle 31G X 8 MM MISC Use as directed with Lantus. 100 each 5  . insulin regular (NOVOLIN R) 100 units/mL injection Inject 15 Units into the skin 2 (two) times daily before a meal.    . isosorbide mononitrate (IMDUR) 30 MG 24 hr tablet Take 1 tablet (30 mg total) by mouth daily. Please make overdue appt with Dr. Irish Lack before anymore refills. Thank you 1st attempt 30 tablet 0  . lisinopril (ZESTRIL) 5 MG tablet TAKE 1 TABLET (5 MG TOTAL) DAILY BY MOUTH. 90 tablet 0  . nitroGLYCERIN (NITROSTAT) 0.4 MG SL tablet PLACE 1 TABLET (0.4 MG TOTAL) UNDER THE TONGUE  EVERY 5 (FIVE) MINUTES AS NEEDED FOR CHEST PAIN. 25 tablet 0  . OVER THE COUNTER MEDICATION Place 1 drop into both eyes 2 (two) times daily. liquid msm eye drops/ for floaters in the eyes     No current facility-administered medications on file prior to visit.    BP 134/78   Pulse 71   Temp (!) 97.3 F (36.3 C) (Temporal)   Ht 5\' 8"  (1.727 m)   Wt 205 lb (93 kg)   SpO2 96%   BMI 31.17 kg/m    Objective:   Physical Exam Constitutional:      Appearance: He is well-nourished.  Cardiovascular:     Rate and Rhythm: Normal rate and regular rhythm.  Pulmonary:     Effort: Pulmonary effort is normal.     Breath sounds: Normal breath sounds.  Musculoskeletal:     Cervical back: Neck supple.  Skin:    General: Skin is warm and dry.  Psychiatric:        Mood and Affect: Mood and affect normal.            Assessment & Plan:

## 2020-02-26 NOTE — Patient Instructions (Signed)
Continue Novolin NPH 55 units twice daily for diabetes.  We increased your Novolin Regular to 25 units before every meal.  Start Glipizide XL 10 mg once daily for blood sugar.   It is important that you improve your diet. Please limit carbohydrates in the form of white bread, rice, pasta, sweets, fast food, fried food, sugary drinks, etc. Increase your consumption of fresh fruits and vegetables, whole grains, lean protein.  Ensure you are consuming 64 ounces of water daily.  Continue to check your blood sugars, please notify me if you continue to see readings at or above 150 consistently after 2-3 weeks.  Please schedule a follow up appointment in 3 months for diabetes check.  It was a pleasure to see you today!   Diabetes Mellitus and Nutrition, Adult When you have diabetes, or diabetes mellitus, it is very important to have healthy eating habits because your blood sugar (glucose) levels are greatly affected by what you eat and drink. Eating healthy foods in the right amounts, at about the same times every day, can help you:  Control your blood glucose.  Lower your risk of heart disease.  Improve your blood pressure.  Reach or maintain a healthy weight. What can affect my meal plan? Every person with diabetes is different, and each person has different needs for a meal plan. Your health care provider may recommend that you work with a dietitian to make a meal plan that is best for you. Your meal plan may vary depending on factors such as:  The calories you need.  The medicines you take.  Your weight.  Your blood glucose, blood pressure, and cholesterol levels.  Your activity level.  Other health conditions you have, such as heart or kidney disease. How do carbohydrates affect me? Carbohydrates, also called carbs, affect your blood glucose level more than any other type of food. Eating carbs naturally raises the amount of glucose in your blood. Carb counting is a method for  keeping track of how many carbs you eat. Counting carbs is important to keep your blood glucose at a healthy level, especially if you use insulin or take certain oral diabetes medicines. It is important to know how many carbs you can safely have in each meal. This is different for every person. Your dietitian can help you calculate how many carbs you should have at each meal and for each snack. How does alcohol affect me? Alcohol can cause a sudden decrease in blood glucose (hypoglycemia), especially if you use insulin or take certain oral diabetes medicines. Hypoglycemia can be a life-threatening condition. Symptoms of hypoglycemia, such as sleepiness, dizziness, and confusion, are similar to symptoms of having too much alcohol.  Do not drink alcohol if: ? Your health care provider tells you not to drink. ? You are pregnant, may be pregnant, or are planning to become pregnant.  If you drink alcohol: ? Do not drink on an empty stomach. ? Limit how much you use to:  0-1 drink a day for women.  0-2 drinks a day for men. ? Be aware of how much alcohol is in your drink. In the [place] one drink equals one 12 oz bottle of beer (355 mL), one 5 oz glass of wine (148 mL), or one 1 oz glass of hard liquor (44 mL). ? Keep yourself hydrated with water, diet soda, or unsweetened iced tea.  Keep in mind that regular soda, juice, and other mixers may contain a lot of sugar and must be counted  as carbs. What are tips for following this plan? Reading food labels  Start by checking the serving size on the "Nutrition Facts" label of packaged foods and drinks. The amount of calories, carbs, fats, and other nutrients listed on the label is based on one serving of the item. Many items contain more than one serving per package.  Check the total grams (g) of carbs in one serving. You can calculate the number of servings of carbs in one serving by dividing the total carbs by 15. For example, if a food has 30 g of  total carbs per serving, it would be equal to 2 servings of carbs.  Check the number of grams (g) of saturated fats and trans fats in one serving. Choose foods that have a low amount or none of these fats.  Check the number of milligrams (mg) of salt (sodium) in one serving. Most people should limit total sodium intake to less than 2,300 mg per day.  Always check the nutrition information of foods labeled as "low-fat" or "nonfat." These foods may be higher in added sugar or refined carbs and should be avoided.  Talk to your dietitian to identify your daily goals for nutrients listed on the label. Shopping  Avoid buying canned, pre-made, or processed foods. These foods tend to be high in fat, sodium, and added sugar.  Shop around the outside edge of the grocery store. This is where you will most often find fresh fruits and vegetables, bulk grains, fresh meats, and fresh dairy. Cooking  Use low-heat cooking methods, such as baking, instead of high-heat cooking methods like deep frying.  Cook using healthy oils, such as olive, canola, or sunflower oil.  Avoid cooking with butter, cream, or high-fat meats. Meal planning  Eat meals and snacks regularly, preferably at the same times every day. Avoid going long periods of time without eating.  Eat foods that are high in fiber, such as fresh fruits, vegetables, beans, and whole grains. Talk with your dietitian about how many servings of carbs you can eat at each meal.  Eat 4-6 oz (112-168 g) of lean protein each day, such as lean meat, chicken, fish, eggs, or tofu. One ounce (oz) of lean protein is equal to: ? 1 oz (28 g) of meat, chicken, or fish. ? 1 egg. ?  cup (62 g) of tofu.  Eat some foods each day that contain healthy fats, such as avocado, nuts, seeds, and fish.   What foods should I eat? Fruits Berries. Apples. Oranges. Peaches. Apricots. Plums. Grapes. Mango. Papaya. Pomegranate. Kiwi. Cherries. Vegetables Lettuce. Spinach.  Leafy greens, including kale, chard, collard greens, and mustard greens. Beets. Cauliflower. Cabbage. Broccoli. Carrots. Green beans. Tomatoes. Peppers. Onions. Cucumbers. Brussels sprouts. Grains Whole grains, such as whole-wheat or whole-grain bread, crackers, tortillas, cereal, and pasta. Unsweetened oatmeal. Quinoa. Brown or wild rice. Meats and other proteins Seafood. Poultry without skin. Lean cuts of poultry and beef. Tofu. Nuts. Seeds. Dairy Low-fat or fat-free dairy products such as milk, yogurt, and cheese. The items listed above may not be a complete list of foods and beverages you can eat. Contact a dietitian for more information. What foods should I avoid? Fruits Fruits canned with syrup. Vegetables Canned vegetables. Frozen vegetables with butter or cream sauce. Grains Refined white flour and flour products such as bread, pasta, snack foods, and cereals. Avoid all processed foods. Meats and other proteins Fatty cuts of meat. Poultry with skin. Breaded or fried meats. Processed meat. Avoid saturated fats.  Dairy Full-fat yogurt, cheese, or milk. Beverages Sweetened drinks, such as soda or iced tea. The items listed above may not be a complete list of foods and beverages you should avoid. Contact a dietitian for more information. Questions to ask a health care provider  Do I need to meet with a diabetes educator?  Do I need to meet with a dietitian?  What number can I call if I have questions?  When are the best times to check my blood glucose? Where to find more information:  American Diabetes Association: diabetes.org  Academy of Nutrition and Dietetics: www.eatright.[employer] of Diabetes and Digestive and Kidney Diseases: [employer]  Association of Diabetes Care and Education Specialists: www.diabeteseducator.org Summary  It is important to have healthy eating habits because your blood sugar (glucose) levels are greatly affected by what you  eat and drink.  A healthy meal plan will help you control your blood glucose and maintain a healthy lifestyle.  Your health care provider may recommend that you work with a dietitian to make a meal plan that is best for you.  Keep in mind that carbohydrates (carbs) and alcohol have immediate effects on your blood glucose levels. It is important to count carbs and to use alcohol carefully. This information is not intended to replace advice given to you by your health care provider. Make sure you discuss any questions you have with your health care provider. Document Revised: 11/26/2018 Document Reviewed: 11/26/2018 Elsevier Patient Education  2021 [employer].

## 2020-03-01 ENCOUNTER — Encounter: Payer: Self-pay | Admitting: Primary Care

## 2020-03-08 ENCOUNTER — Ambulatory Visit (INDEPENDENT_AMBULATORY_CARE_PROVIDER_SITE_OTHER): Payer: Medicare Other

## 2020-03-08 DIAGNOSIS — Z95810 Presence of automatic (implantable) cardiac defibrillator: Secondary | ICD-10-CM

## 2020-03-08 DIAGNOSIS — I5022 Chronic systolic (congestive) heart failure: Secondary | ICD-10-CM

## 2020-03-12 NOTE — Progress Notes (Signed)
EPIC Encounter for ICM Monitoring  Patient Name: [name] is a 75 y.o. male Date: 03/12/2020 Primary Care Physican: [name], NP Primary [place] Electrophysiologist:Allred 02/26/2020 Office Weight:205lbs  Time in AF0.2 hr/day (0.9%)  Spoke with wife and patient is doing well with the exception he had the flu about a week ago.    Opivol thoracic impedancenormal.  No diuretic prescribed  Recommendations:No changes and encouraged to call if experiencing any fluid symptoms.  Follow-up plan: ICM clinic phone appointment on5/03/2020(he requests ICM follow every 2 months at this time).91 day device clinic remote transmission4/06/2020.   EP/Cardiology Office Visits:Recalls for 08/25/2019 with Dr.Varansi and 04/08/2020 with Dr Rayann Heman.   Copy of ICM check sent to Dr.Allred      3 month ICM trend: 03/08/2020.    1 Year ICM trend:       [name], RN 03/12/2020 9:27 AM

## 2020-03-13 ENCOUNTER — Other Ambulatory Visit: Payer: Self-pay | Admitting: Interventional Cardiology

## 2020-04-05 ENCOUNTER — Other Ambulatory Visit: Payer: Self-pay | Admitting: Interventional Cardiology

## 2020-04-07 ENCOUNTER — Ambulatory Visit (INDEPENDENT_AMBULATORY_CARE_PROVIDER_SITE_OTHER): Payer: Medicare Other

## 2020-04-07 DIAGNOSIS — I255 Ischemic cardiomyopathy: Secondary | ICD-10-CM

## 2020-04-08 LAB — CUP PACEART REMOTE DEVICE CHECK
Battery Remaining Longevity: 130 mo
Battery Voltage: 3.03 V
Brady Statistic RV Percent Paced: 0.01 %
Date Time Interrogation Session: 20220406022724
HighPow Impedance: 70 Ohm
Implantable Lead Implant Date: 20210105
Implantable Lead Location: 753860
Implantable Pulse Generator Implant Date: 20210105
Lead Channel Impedance Value: 380 Ohm
Lead Channel Impedance Value: 437 Ohm
Lead Channel Pacing Threshold Amplitude: 0.5 V
Lead Channel Pacing Threshold Pulse Width: 0.4 ms
Lead Channel Sensing Intrinsic Amplitude: 10.875 mV
Lead Channel Sensing Intrinsic Amplitude: 10.875 mV
Lead Channel Setting Pacing Amplitude: 2.5 V
Lead Channel Setting Pacing Pulse Width: 0.4 ms
Lead Channel Setting Sensing Sensitivity: 0.3 mV

## 2020-04-16 DIAGNOSIS — J324 Chronic pansinusitis: Secondary | ICD-10-CM | POA: Diagnosis not present

## 2020-04-19 NOTE — Progress Notes (Signed)
Remote ICD transmission.   

## 2020-04-20 ENCOUNTER — Other Ambulatory Visit: Payer: Self-pay | Admitting: Interventional Cardiology

## 2020-05-04 ENCOUNTER — Ambulatory Visit (INDEPENDENT_AMBULATORY_CARE_PROVIDER_SITE_OTHER): Payer: Medicare Other

## 2020-05-04 DIAGNOSIS — I5022 Chronic systolic (congestive) heart failure: Secondary | ICD-10-CM

## 2020-05-04 DIAGNOSIS — Z95810 Presence of automatic (implantable) cardiac defibrillator: Secondary | ICD-10-CM | POA: Diagnosis not present

## 2020-05-05 NOTE — Progress Notes (Signed)
EPIC Encounter for ICM Monitoring  Patient Name: [name] is a 75 y.o. male Date: 05/05/2020 Primary Care Physican: [name], NP Primary [place] Electrophysiologist:Allred 05/05/2020 Office Weight:205lbs  Time in AF<0.1 hr/day (<0.1%)  Spoke with patient and he is feeling well.  Denies any fluid symptoms during decreased impedance.  He uses a lot of salt on his foods.  Advised to limit salt intake to 2000 mg a day and review food labels.     Opivol thoracic impedancenormal but was suggesting possible fluid accumulation from 3/7-4/23.  No diuretic prescribed  Recommendations:No changes and encouraged to call if experiencing any fluid symptoms.  Follow-up plan: ICM clinic phone appointment on7/01/2020(he requests ICM follow every 2 months at this time).91 day device clinic remote transmission7/06/2020.   EP/Cardiology Office Visits:Recalls for 08/25/2019 with Dr.Varansi and 04/08/2020 with Dr Rayann Heman.   Copy of ICM check sent to Dr.Allred.   3 month ICM trend: 05/04/2020.    1 Year ICM trend:       [name], RN 05/05/2020 4:07 PM

## 2020-05-16 ENCOUNTER — Other Ambulatory Visit: Payer: Self-pay | Admitting: Interventional Cardiology

## 2020-05-19 DIAGNOSIS — J309 Allergic rhinitis, unspecified: Secondary | ICD-10-CM | POA: Diagnosis not present

## 2020-05-25 ENCOUNTER — Other Ambulatory Visit: Payer: Self-pay | Admitting: Interventional Cardiology

## 2020-05-25 ENCOUNTER — Telehealth: Payer: Self-pay | Admitting: Primary Care

## 2020-05-25 NOTE — Telephone Encounter (Signed)
Called pt for appt reminder. Pt said that he had a cough and soar throat. Pt sated that he went to urgent care on last Tuesday and they gave him Zpac, cough medicine and protozone. Pt stated that he is getting better. Pt also stated that he took a at home test two weeks ago and it was negative.

## 2020-05-25 NOTE — Telephone Encounter (Signed)
Noted, will evaluate as scheduled.

## 2020-05-26 ENCOUNTER — Other Ambulatory Visit: Payer: Self-pay

## 2020-05-26 ENCOUNTER — Encounter: Payer: Self-pay | Admitting: Primary Care

## 2020-05-26 ENCOUNTER — Ambulatory Visit (INDEPENDENT_AMBULATORY_CARE_PROVIDER_SITE_OTHER): Payer: Medicare Other | Admitting: Primary Care

## 2020-05-26 VITALS — BP 140/76 | HR 79 | Temp 98.1°F | Ht 68.0 in | Wt 210.2 lb

## 2020-05-26 DIAGNOSIS — Z794 Long term (current) use of insulin: Secondary | ICD-10-CM | POA: Diagnosis not present

## 2020-05-26 DIAGNOSIS — E1159 Type 2 diabetes mellitus with other circulatory complications: Secondary | ICD-10-CM

## 2020-05-26 DIAGNOSIS — I255 Ischemic cardiomyopathy: Secondary | ICD-10-CM | POA: Diagnosis not present

## 2020-05-26 LAB — POCT GLYCOSYLATED HEMOGLOBIN (HGB A1C): Hemoglobin A1C: 9.3 % — AB (ref 4.0–5.6)

## 2020-05-26 MED ORDER — INSULIN REGULAR HUMAN 100 UNIT/ML IJ SOLN: 25.0000 [IU] | Freq: Three times a day (TID) | INTRAMUSCULAR | 2 refills | Status: DC

## 2020-05-26 NOTE — Assessment & Plan Note (Signed)
Remains uncontrolled with A1C of 9.3 today, has also had two rounds of prednisone within the last month.   He is now eating three meals daily but is injecting Novolin R 25 units twice daily. Discussed to add in Novolin 25 units for each meal consumed.   He continues to decline endocrinology evaluation. Continue Novolin N 55 units BID. conitnue Novolin R 25 units but add extra 25 units daily.  Stressed to notify me if he continues to run at or above 150 after 1-2 weeks. He agreess.  Follow up in 3 months.

## 2020-05-26 NOTE — Patient Instructions (Signed)
Continue injecting Novolin N 55 units twice daily.  Inject Novolin R 25 units three times daily before each meal.  Continue checking your blood sugar levels.  Appropriate times to check your blood sugar levels are:  -Before any meal (breakfast, lunch, dinner) -Two hours after any meal (breakfast, lunch, dinner) -Bedtime  Record your readings and notify me if you continue to consistently run at or above 150 after 1-2 weeks.  Please schedule a follow up appointment in 3 months for diabetes check.  It was a pleasure to see you today!

## 2020-05-26 NOTE — Progress Notes (Signed)
Subjective:    Patient ID: [name], male    DOB: 15-Sep-1945, 75 y.o.   MRN: [id number]  HPI  [name] is a very pleasant 75 y.o. male with a significant medical history including uncontrolled type 2 diabetes, CAD, hypertension, atrial fibrillation, NASH, BPH, ICD present who presents today for follow up of diabetes.  Current medications include: Glipizide XL 10 mg, Novolin N 55 units BID, Novolin R 25 units BID before meals, only eats two meals daily  He is checking his blood glucose 2 times daily and is getting readings of 100-155 until mid April 2022. He caught a "viral" infection in mid April 2022 and again in early May 2022, was placed on two different courses of prednisone. Now his glucose is running low 200's.  Last A1C: 9.5 in February 2022, 9.3 today. Last Eye Exam: UTD Last Foot Exam: UTD Pneumonia Vaccination: 2018 ACE/ARB: Lisinopril  Statin: None, intolerant.   He endorses an improved diet, is eating smaller portions. He denies chest pain, dizziness, visual changes, numbness/tinlging. He feels "great!".   Wt Readings from Last 3 Encounters:  05/26/20 210 lb 4 oz (95.4 kg)  02/26/20 205 lb (93 kg)  02/06/20 210 lb (95.3 kg)     Review of Systems  Eyes: Negative for visual disturbance.  Respiratory: Negative for shortness of breath.   Cardiovascular: Negative for chest pain.  Neurological: Negative for dizziness and headaches.         Past Medical History:  Diagnosis Date  . Arthritis    "fingers, shoulders" (08/03/2015)  . Atrial fibrillation (HCC)    Paroxysmal, rare episodes, sinus rhythm on flecainide, patient prefers not to take Coumadin  . Bradycardia    April, 2013  . Chest pain    Nuclear, 2006, no ischemia  . Chronic lower back pain    "all my life"  . Coronary artery disease    s/p staged cath 05/12/2014 and 5/11, DES x 2 to heavily calcified RCA, residual with 60% prox LAD, 70% D1  . Ejection fraction    EF 55-60%,  echo, January,  2011  . Elevated CPK    CPK elevated with normal MB and normal troponin the past  . Heart murmur   . History of echocardiogram    Echo 8/16: EF 55%, normal wall motion, grade 2 diastolic dysfunction, trivial MR, normal RV function, PASP 27 mmHg  . Hypertension   . IBS (irritable bowel syndrome)   . Myocardial infarction ([place]) 06/2014  . Sinus drainage   . Type II diabetes mellitus ([place])     Social History   Socioeconomic History  . Marital status: Married    Spouse name: Not on file  . Number of children: Not on file  . Years of education: Not on file  . Highest education level: Not on file  Occupational History  . Not on file  Tobacco Use  . Smoking status: Never Smoker  . Smokeless tobacco: Never Used  Vaping Use  . Vaping Use: Never used  Substance and Sexual Activity  . Alcohol use: No  . Drug use: No  . Sexual activity: Not on file  Other Topics Concern  . Not on file  Social History Narrative  . Not on file   Social Determinants of Health   Financial Resource Strain: Not on file  Food Insecurity: Not on file  Transportation Needs: Not on file  Physical Activity: Not on file  Stress: Not on file  Social Connections:  Not on file  Intimate Partner Violence: Not on file    Past Surgical History:  Procedure Laterality Date  . APPENDECTOMY    . CARDIAC CATHETERIZATION N/A 05/12/2014   Procedure: Left Heart Cath and Coronary Angiography;  Surgeon: Troy Sine, MD;  Location: [facility];  Service: Cardiovascular;  Laterality: N/A;  . CARDIAC CATHETERIZATION N/A 05/13/2014   Procedure: Coronary/Graft Atherectomy;  Surgeon: Troy Sine, MD;  Location: [facility];  Service: Cardiovascular;  Laterality: N/A;  . CARDIAC CATHETERIZATION Right 05/13/2014   Procedure: Temporary Pacemaker;  Surgeon: Troy Sine, MD;  Location: [facility];  Service: Cardiovascular;  Laterality: Right;  . CARDIAC CATHETERIZATION N/A 05/13/2014   Procedure:  Coronary Stent Intervention;  Surgeon: Troy Sine, MD;  Location: [facility];  Service: Cardiovascular;  Laterality: N/A;  . CARDIAC CATHETERIZATION N/A 06/18/2014   Procedure: Left Heart Cath and Coronary Angiography;  Surgeon: Peter M Martinique, MD;  Location: [facility];  Service: Cardiovascular;  Laterality: N/A;  . CARDIAC CATHETERIZATION N/A 08/03/2015   Procedure: Left Heart Cath and Cors/Grafts Angiography;  Surgeon: Nelva Bush, MD;  Location: [facility];  Service: Cardiovascular;  Laterality: N/A;  . CARDIOVASCULAR STRESS TEST  05/06/2014  . CATARACT EXTRACTION W/ INTRAOCULAR LENS IMPLANT Left   . COLONOSCOPY WITH PROPOFOL N/A 02/13/2020   Procedure: COLONOSCOPY WITH PROPOFOL;  Surgeon: Carol Ada, MD;  Location: [facility];  Service: Endoscopy;  Laterality: N/A;  . CORONARY ARTERY BYPASS GRAFT N/A 06/22/2014   Procedure: CORONARY ARTERY BYPASS GRAFTING (CABG) x five, using left internal mammary artery, and right leg greater saphenous vein harvested endoscopically;  Surgeon: Melrose Nakayama, MD;  Location: [facility];  Service: Open Heart Surgery;  Laterality: N/A;  . ICD IMPLANT N/A 01/07/2019   Procedure: ICD IMPLANT;  Surgeon: Thompson Grayer, MD;  Location: [facility];  Service: Cardiovascular;  Laterality: N/A;  . LAPAROSCOPIC CHOLECYSTECTOMY    . LEFT HEART CATH AND CORS/GRAFTS ANGIOGRAPHY N/A 01/06/2019   Procedure: LEFT HEART CATH AND CORS/GRAFTS ANGIOGRAPHY;  Surgeon: Nelva Bush, MD;  Location: [facility];  Service: Cardiovascular;  Laterality: N/A;  . MAZE N/A 06/22/2014   Procedure: MAZE;  Surgeon: Melrose Nakayama, MD;  Location: [facility];  Service: Open Heart Surgery;  Laterality: N/A;  . POLYPECTOMY  02/13/2020   Procedure: POLYPECTOMY;  Surgeon: Carol Ada, MD;  Location: [facility];  Service: Endoscopy;;  . TEE WITHOUT CARDIOVERSION N/A 06/22/2014   Procedure: TRANSESOPHAGEAL ECHOCARDIOGRAM (TEE);  Surgeon: Melrose Nakayama, MD;  Location: [facility];  Service: Open Heart Surgery;  Laterality: N/A;    Family History  Problem Relation Age of Onset  . Alzheimer's disease Mother   . Emphysema Father   . Cancer Brother   . Arrhythmia Sister   . Heart attack Sister   . Heart disease Sister   . Hyperlipidemia Sister   . Hypertension Sister     Allergies  Allergen Reactions  . Lipitor [Atorvastatin] Other (See Comments)    Myopathy Spring 2006  . Pravastatin Other (See Comments)    LEG CRAMPS  . Simvastatin Other (See Comments)    LEG CRAMPS    Current Outpatient Medications on File Prior to Visit  Medication Sig Dispense Refill  . acetaminophen (TYLENOL) 325 MG tablet Take 1-2 tablets (325-650 mg total) by mouth every 4 (four) hours as needed for mild pain.    Marland Kitchen apixaban (ELIQUIS) 5 MG  TABS tablet Take 1 tablet (5 mg total) by mouth 2 (two) times daily. 60 tablet 5  . carvedilol (COREG) 6.25 MG tablet TAKE 1 TABLET BY MOUTH TWICE A DAY WITH MEAL 30 tablet 0  . glipiZIDE (GLUCOTROL XL) 10 MG 24 hr tablet Take 1 tablet (10 mg total) by mouth daily with breakfast. For diabetes. 90 tablet 3  . insulin NPH Human (NOVOLIN N RELION) 100 UNIT/ML injection Inject 55 units into the skin every morning and 55 units into the skin in the evening. (Patient taking differently: Inject 55 Units into the skin 2 (two) times daily before a meal.) 30 mL 5  . Insulin Pen Needle 31G X 8 MM MISC Use as directed with Lantus. 100 each 5  . insulin regular (NOVOLIN R) 100 units/mL injection Inject 0.25 mLs (25 Units total) into the skin 2 (two) times daily before a meal. 20 mL 3  . isosorbide mononitrate (IMDUR) 30 MG 24 hr tablet Take 1 tablet (30 mg total) by mouth daily. Please make overdue appt with Dr. Irish Lack before anymore refills. Thank you 2nd attempt 15 tablet 0  . lisinopril (ZESTRIL) 5 MG tablet TAKE 1 TABLET BY MOUTH EVERY DAY 90 tablet 0  . nitroGLYCERIN (NITROSTAT) 0.4 MG SL tablet PLACE 1 TABLET (0.4 MG TOTAL)  UNDER THE TONGUE EVERY 5 (FIVE) MINUTES AS NEEDED FOR CHEST PAIN. 25 tablet 0  . OVER THE COUNTER MEDICATION Place 1 drop into both eyes 2 (two) times daily. liquid msm eye drops/ for floaters in the eyes     No current facility-administered medications on file prior to visit.    BP 140/76   Pulse 79   Temp 98.1 F (36.7 C) (Temporal)   Ht 5' 8"  (1.727 m)   Wt 210 lb 4 oz (95.4 kg)   SpO2 98%   BMI 31.97 kg/m  Objective:   Physical Exam Cardiovascular:     Rate and Rhythm: Normal rate and regular rhythm.  Pulmonary:     Effort: Pulmonary effort is normal.     Breath sounds: Normal breath sounds. No wheezing or rales.  Musculoskeletal:     Cervical back: Neck supple.  Skin:    General: Skin is warm and dry.  Neurological:     Mental Status: He is alert and oriented to person, place, and time.           Assessment & Plan:      This visit occurred during the SARS-CoV-2 public health emergency.  Safety protocols were in place, including screening questions prior to the visit, additional usage of staff PPE, and extensive cleaning of exam room while observing appropriate contact time as indicated for disinfecting solutions.

## 2020-05-27 ENCOUNTER — Ambulatory Visit: Payer: Medicare Other | Admitting: Primary Care

## 2020-06-07 ENCOUNTER — Other Ambulatory Visit: Payer: Self-pay | Admitting: Interventional Cardiology

## 2020-06-19 ENCOUNTER — Other Ambulatory Visit: Payer: Self-pay | Admitting: Interventional Cardiology

## 2020-07-02 ENCOUNTER — Other Ambulatory Visit: Payer: Self-pay | Admitting: Interventional Cardiology

## 2020-07-02 ENCOUNTER — Ambulatory Visit (INDEPENDENT_AMBULATORY_CARE_PROVIDER_SITE_OTHER): Payer: Medicare Other

## 2020-07-02 DIAGNOSIS — Z95810 Presence of automatic (implantable) cardiac defibrillator: Secondary | ICD-10-CM

## 2020-07-02 DIAGNOSIS — I5022 Chronic systolic (congestive) heart failure: Secondary | ICD-10-CM

## 2020-07-02 NOTE — Progress Notes (Signed)
EPIC Encounter for ICM Monitoring  Patient Name: [name] is a 75 y.o. male Date: 07/02/2020 Primary Care Physican: [name], NP Primary Cardiologist: Irish Lack Electrophysiologist: Allred 07/02/2020 Weight:  210 lbs                                                          Time in AF   <0.1 hr/day (<0.1%)   Spoke with patient and he is feeling well.  Denies any fluid symptoms during decreased impedance.  He has made changes to diet and eliminating salt as much as possible.     Opivol thoracic impedance normal.   No diuretic prescribed   Recommendations: No changes and encouraged to call if experiencing any fluid symptoms.   Follow-up plan: ICM clinic phone appointment on 08/30/2020 (he requests ICM follow every 2 months at this time).  91 day device clinic remote transmission 10/56/2022.     EP/Cardiology Office Visits: 07/07/2020 with Dr. Beau Fanny.     Copy of ICM check sent to Dr. Rayann Heman.    3 month ICM trend: 07/02/2020.    1 Year ICM trend:       [name], RN 07/02/2020 3:35 PM

## 2020-07-05 LAB — CUP PACEART REMOTE DEVICE CHECK
Battery Remaining Longevity: 128 mo
Battery Voltage: 3.02 V
Brady Statistic RV Percent Paced: 0.02 %
Date Time Interrogation Session: 20220701001802
HighPow Impedance: 78 Ohm
Implantable Lead Implant Date: 20210105
Implantable Lead Location: 753860
Implantable Pulse Generator Implant Date: 20210105
Lead Channel Impedance Value: 380 Ohm
Lead Channel Impedance Value: 437 Ohm
Lead Channel Pacing Threshold Amplitude: 0.5 V
Lead Channel Pacing Threshold Pulse Width: 0.4 ms
Lead Channel Sensing Intrinsic Amplitude: 11.125 mV
Lead Channel Sensing Intrinsic Amplitude: 11.125 mV
Lead Channel Setting Pacing Amplitude: 2.5 V
Lead Channel Setting Pacing Pulse Width: 0.4 ms
Lead Channel Setting Sensing Sensitivity: 0.3 mV

## 2020-07-05 NOTE — Progress Notes (Signed)
Cardiology Office Note   Date:  07/07/2020   ID:  IHAN PAT, DOB [birth year], MRN [id number]  PCP:  [name], NP    No chief complaint on file.  CAD  Wt Readings from Last 3 Encounters:  07/07/20 214 lb 9.6 oz (97.3 kg)  05/26/20 210 lb 4 oz (95.4 kg)  02/26/20 205 lb (93 kg)       History of Present Illness: [name] is a 75 y.o. male    who presents today to follow-up coronary disease.  He had stents in 5/16 with rotational atherectomy.  He then had early restenosis and progression of left sided disease. He had surgery with Dr. Roxan Hockey.  He is post-CABG; after having surgery in June 2016.  He has some toe numbness intermittently. His surgery was June 22, 2014. In addition he had a maze procedure and a left atrial appendage clip was placed. He remains on anticoagulation. During his initial hospitalization his EF was in the 40-45% range.  EF had normalized at 55%.   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.  He had palpitations with his prior atrial fibrillation. He did have left atrial appendage clipping during his open heart surgery.   He had a NSTEMI in Augist 2017.  EF by cath was low.  EF by echo was 50-55%. SVG to RCA occluded but medically managed.     He had syncope in Jan 2021.  EF has decreased to EF 35-40%.  Cath was done: "Severe native coronary artery disease, as detailed below, not significant changed from prior catheterization in 2017. Widely patent LIMA-LAD. Patent but small and diffusely diseased SVG-rPDA-rPL, with a focal 90% stenosis just before the SVG-rPDA anastomosis.  Graft appearance is similar to prior catheterization in 2017. Chronically occluded SVG-ramus-D1. Mildly elevated left ventricular filling pressure.   Recommendations: Given stable appearance of coronary artery disease and negligible troponin, I do not believe that worsening CAD is driving his dizziness/syncope.  Further evaluation for potential arrhythmic cause  is recommended. Continue medical therapy and aggressive secondary prevention. Restart apixaban tomorrow if no evidence of bleeding/vascular complications."   AICD was placed:" Implantation of a Medtronic single chamber ICD on 01/07/2019 by Dr. Rayann Heman.  The patient received a Medtronic model number [id number] ICD with model number [id number] right ventricular lead. DFT's were deferred at time of implant."   Since the hospital stay, he felt well.  No ICD discharges.  Denies : Chest pain. Dizziness. Leg edema. Nitroglycerin use. Orthopnea. Palpitations. Paroxysmal nocturnal dyspnea. Shortness of breath. Syncope.    Plays golf 3 x/week.  This is his most strenuous activity.    Past Medical History:  Diagnosis Date   Arthritis    "fingers, shoulders" (08/03/2015)   Atrial fibrillation (HCC)    Paroxysmal, rare episodes, sinus rhythm on flecainide, patient prefers not to take Coumadin   Bradycardia    April, 2013   Chest pain    Nuclear, 2006, no ischemia   Chronic lower back pain    "all my life"   Coronary artery disease    s/p staged cath 05/12/2014 and 5/11, DES x 2 to heavily calcified RCA, residual with 60% prox LAD, 70% D1   Ejection fraction    EF 55-60%,  echo, January, 2011   Elevated CPK    CPK elevated with normal MB and normal troponin the past   Heart murmur    History of echocardiogram    Echo 8/16: EF 55%, normal  wall motion, grade 2 diastolic dysfunction, trivial MR, normal RV function, PASP 27 mmHg   Hypertension    IBS (irritable bowel syndrome)    Myocardial infarction ([place]) 06/2014   Sinus drainage    Type II diabetes mellitus ([place])     Past Surgical History:  Procedure Laterality Date   APPENDECTOMY     CARDIAC CATHETERIZATION N/A 05/12/2014   Procedure: Left Heart Cath and Coronary Angiography;  Surgeon: Troy Sine, MD;  Location: [facility];  Service: Cardiovascular;  Laterality: N/A;   CARDIAC CATHETERIZATION N/A 05/13/2014   Procedure:  Coronary/Graft Atherectomy;  Surgeon: Troy Sine, MD;  Location: [facility];  Service: Cardiovascular;  Laterality: N/A;   CARDIAC CATHETERIZATION Right 05/13/2014   Procedure: Temporary Pacemaker;  Surgeon: Troy Sine, MD;  Location: [facility];  Service: Cardiovascular;  Laterality: Right;   CARDIAC CATHETERIZATION N/A 05/13/2014   Procedure: Coronary Stent Intervention;  Surgeon: Troy Sine, MD;  Location: [facility];  Service: Cardiovascular;  Laterality: N/A;   CARDIAC CATHETERIZATION N/A 06/18/2014   Procedure: Left Heart Cath and Coronary Angiography;  Surgeon: Peter M Martinique, MD;  Location: [facility];  Service: Cardiovascular;  Laterality: N/A;   CARDIAC CATHETERIZATION N/A 08/03/2015   Procedure: Left Heart Cath and Cors/Grafts Angiography;  Surgeon: Nelva Bush, MD;  Location: [facility];  Service: Cardiovascular;  Laterality: N/A;   CARDIOVASCULAR STRESS TEST  05/06/2014   CATARACT EXTRACTION W/ INTRAOCULAR LENS IMPLANT Left    COLONOSCOPY WITH PROPOFOL N/A 02/13/2020   Procedure: COLONOSCOPY WITH PROPOFOL;  Surgeon: Carol Ada, MD;  Location: [facility];  Service: Endoscopy;  Laterality: N/A;   CORONARY ARTERY BYPASS GRAFT N/A 06/22/2014   Procedure: CORONARY ARTERY BYPASS GRAFTING (CABG) x five, using left internal mammary artery, and right leg greater saphenous vein harvested endoscopically;  Surgeon: Melrose Nakayama, MD;  Location: [facility];  Service: Open Heart Surgery;  Laterality: N/A;   ICD IMPLANT N/A 01/07/2019   Procedure: ICD IMPLANT;  Surgeon: Thompson Grayer, MD;  Location: [facility];  Service: Cardiovascular;  Laterality: N/A;   LAPAROSCOPIC CHOLECYSTECTOMY     LEFT HEART CATH AND CORS/GRAFTS ANGIOGRAPHY N/A 01/06/2019   Procedure: LEFT HEART CATH AND CORS/GRAFTS ANGIOGRAPHY;  Surgeon: Nelva Bush, MD;  Location: [facility];  Service: Cardiovascular;  Laterality: N/A;   MAZE N/A 06/22/2014    Procedure: MAZE;  Surgeon: Melrose Nakayama, MD;  Location: [facility];  Service: Open Heart Surgery;  Laterality: N/A;   POLYPECTOMY  02/13/2020   Procedure: POLYPECTOMY;  Surgeon: Carol Ada, MD;  Location: [facility];  Service: Endoscopy;;   TEE WITHOUT CARDIOVERSION N/A 06/22/2014   Procedure: TRANSESOPHAGEAL ECHOCARDIOGRAM (TEE);  Surgeon: Melrose Nakayama, MD;  Location: [facility];  Service: Open Heart Surgery;  Laterality: N/A;     Current Outpatient Medications  Medication Sig Dispense Refill   acetaminophen (TYLENOL) 325 MG tablet Take 1-2 tablets (325-650 mg total) by mouth every 4 (four) hours as needed for mild pain.     apixaban (ELIQUIS) 5 MG TABS tablet Take 1 tablet (5 mg total) by mouth 2 (two) times daily. 60 tablet 5   carvedilol (COREG) 6.25 MG tablet TAKE 1 TABLET BY MOUTH TWICE A DAY WITH MEAL 30 tablet 0   glipiZIDE (GLUCOTROL XL) 10 MG 24 hr tablet Take 1 tablet (10 mg total) by mouth daily with breakfast. For diabetes. 90 tablet 3   insulin  NPH Human (NOVOLIN N RELION) 100 UNIT/ML injection Inject 55 units into the skin every morning and 55 units into the skin in the evening. (Patient taking differently: Inject 55 Units into the skin 2 (two) times daily before a meal.) 30 mL 5   Insulin Pen Needle 31G X 8 MM MISC Use as directed with Lantus. 100 each 5   insulin regular (NOVOLIN R) 100 units/mL injection Inject 0.25 mLs (25 Units total) into the skin 3 (three) times daily before meals. 60 mL 2   isosorbide mononitrate (IMDUR) 30 MG 24 hr tablet Take 1 tablet (30 mg total) by mouth daily. Please make overdue appt with Dr. Irish Lack before anymore refills. Thank you 2nd attempt 15 tablet 0   lisinopril (ZESTRIL) 5 MG tablet Take 1 tablet (5 mg total) by mouth daily. Please keep upcoming appt for future refills. 90 tablet 0   nitroGLYCERIN (NITROSTAT) 0.4 MG SL tablet PLACE 1 TABLET (0.4 MG TOTAL) UNDER THE TONGUE EVERY 5 (FIVE) MINUTES AS NEEDED FOR CHEST PAIN. 25 tablet 0    OVER THE COUNTER MEDICATION Place 1 drop into both eyes 2 (two) times daily. liquid msm eye drops/ for floaters in the eyes     No current facility-administered medications for this visit.    Allergies:   Lipitor [atorvastatin], Pravastatin, and Simvastatin    Social History:  The patient  reports that he has never smoked. He has never used smokeless tobacco. He reports that he does not drink alcohol and does not use drugs.   Family History:  The patient's family history includes Alzheimer's disease in his mother; Arrhythmia in his sister; Cancer in his brother; Emphysema in his father; Heart attack in his sister; Heart disease in his sister; Hyperlipidemia in his sister; Hypertension in his sister.    ROS:  Please see the history of present illness.   Otherwise, review of systems are positive for back pain.   All other systems are reviewed and negative.    PHYSICAL EXAM: VS:  BP (!) 92/58   Pulse 68   Ht 5' 8"  (1.727 m)   Wt 214 lb 9.6 oz (97.3 kg)   SpO2 97%   BMI 32.63 kg/m  , BMI Body mass index is 32.63 kg/m. GEN: Well nourished, well developed, in no acute distress HEENT: normal Neck: no JVD, carotid bruits, or masses Cardiac: RRR; no murmurs, rubs, or gallops,no edema  Respiratory:  clear to auscultation bilaterally, normal work of breathing GI: soft, nontender, nondistended, + BS MS: no deformity or atrophy Skin: warm and dry, no rash Neuro:  Strength and sensation are intact Psych: euthymic mood, full affect   EKG:   The ekg ordered today demonstrates NSR, PVC, nonspecific ST changes   Recent Labs: 11/26/2019: ALT 41; BUN 17; Creatinine, Ser 1.20; Hemoglobin 13.8; Platelets 207.0; Potassium 4.5; Sodium 141   Lipid Panel    Component Value Date/Time   CHOL 159 11/26/2019 0748   TRIG 86.0 11/26/2019 0748   HDL 33.40 (L) 11/26/2019 0748   CHOLHDL 5 11/26/2019 0748   VLDL 17.2 11/26/2019 0748   LDLCALC 108 (H) 11/26/2019 0748     Other studies  Reviewed: Additional studies/ records that were reviewed today with results demonstrating: labs reviewed, LDL 108 in 2021.   ASSESSMENT AND PLAN:  CAD: No angina. Continue aggressive secondary prevention.   Chronic systolic heart failure: Appears euvolemic.  Low salt diet.   HTN: Home readings are in the 120/80 range.  The current medical regimen  is effective;  continue present plan and medications. S/p AICD: Followed with EP.  Hyperlipidemia: statin intolerance.  Will check with Pharm.D. if he would be a candidate for PCSK9 inhibitor.  I did explain to him that this was a new class of medicine not related to statins.  Would like to see his LDL closer to 70. Type 2 DM: A1C 9.3.  High fiber diet.  Managed with PMD.  Insulin being adjusted.  PAF: in NSR.  No palpitations.  Eliquis for stroke prevention.    Current medicines are reviewed at length with the patient today.  The patient concerns regarding his medicines were addressed.  The following changes have been made:  No change  Labs/ tests ordered today include:  No orders of the defined types were placed in this encounter.   Recommend 150 minutes/week of aerobic exercise Low fat, low carb, high fiber diet recommended  Disposition:   FU in 1 year   Signed, Larae Grooms, MD  07/07/2020 8:38 AM    [facility] [address], [place], [place]  [address] Phone: [phone]; Fax: [phone]

## 2020-07-07 ENCOUNTER — Ambulatory Visit (INDEPENDENT_AMBULATORY_CARE_PROVIDER_SITE_OTHER): Payer: Medicare Other

## 2020-07-07 ENCOUNTER — Encounter: Payer: Self-pay | Admitting: Interventional Cardiology

## 2020-07-07 ENCOUNTER — Ambulatory Visit (INDEPENDENT_AMBULATORY_CARE_PROVIDER_SITE_OTHER): Payer: Medicare Other | Admitting: Interventional Cardiology

## 2020-07-07 ENCOUNTER — Other Ambulatory Visit: Payer: Self-pay

## 2020-07-07 ENCOUNTER — Telehealth: Payer: Self-pay | Admitting: Pharmacist

## 2020-07-07 VITALS — BP 92/58 | HR 68 | Ht 68.0 in | Wt 214.6 lb

## 2020-07-07 DIAGNOSIS — I25810 Atherosclerosis of coronary artery bypass graft(s) without angina pectoris: Secondary | ICD-10-CM | POA: Diagnosis not present

## 2020-07-07 DIAGNOSIS — I480 Paroxysmal atrial fibrillation: Secondary | ICD-10-CM | POA: Diagnosis not present

## 2020-07-07 DIAGNOSIS — I255 Ischemic cardiomyopathy: Secondary | ICD-10-CM

## 2020-07-07 DIAGNOSIS — Z95810 Presence of automatic (implantable) cardiac defibrillator: Secondary | ICD-10-CM

## 2020-07-07 DIAGNOSIS — I5022 Chronic systolic (congestive) heart failure: Secondary | ICD-10-CM

## 2020-07-07 DIAGNOSIS — E1159 Type 2 diabetes mellitus with other circulatory complications: Secondary | ICD-10-CM

## 2020-07-07 DIAGNOSIS — E782 Mixed hyperlipidemia: Secondary | ICD-10-CM | POA: Diagnosis not present

## 2020-07-07 MED ORDER — EZETIMIBE 10 MG PO TABS: 10.0000 mg | ORAL_TABLET | Freq: Every day | ORAL | 11 refills | Status: DC

## 2020-07-07 MED ORDER — NITROGLYCERIN 0.4 MG SL SUBL: 0.4000 mg | SUBLINGUAL_TABLET | SUBLINGUAL | 6 refills | Status: AC | PRN

## 2020-07-07 NOTE — Telephone Encounter (Signed)
Received message from Dr Irish Lack regarding additional lipid lowering therapy since pt is statin intolerant.  Our charts show pt has previously tried: pravastatin 20-40mg daily, rosuvastatin 20m daily and 3x per week, simvastatin 170m3x per week, atorvastatin. Pt reports myalgias and leg cramps on statin therapy.  PCSK9i and Nexlizet are unfortunately cost prohibitive for pt as he is almost in the donut hole already from his Eliquis and insulin prescriptions, so he does not wish to add on another branded med. Advised him I can let him know when grant funding with [facility].  In the mean time, he is agreeable to try ezetimibe 1080maily. Copay was $17 at his pharmacy, [facility] cheaper at $5, however when I called them they stated that copay is $30 with the card, so [facility]. Scheduled follow up labs in ~5-6 weeks same day as appt with Dr AllRayann Heman

## 2020-07-07 NOTE — Patient Instructions (Signed)
Medication Instructions:  Your physician recommends that you continue on your current medications as directed. Please refer to the Current Medication list given to you today.  *If you need a refill on your cardiac medications before your next appointment, please call your pharmacy*   Lab Work: none If you have labs (blood work) drawn today and your tests are completely normal, you will receive your results only by: [place] (if you have MyChart) OR A paper copy in the mail If you have any lab test that is abnormal or we need to change your treatment, we will call you to review the results.   Testing/Procedures: none   Follow-Up: At [facility], you and your health needs are our priority.  As part of our continuing mission to provide you with exceptional heart care, we have created designated Provider Care Teams.  These Care Teams include your primary Cardiologist (physician) and Advanced Practice Providers (APPs -  Physician Assistants and Nurse Practitioners) who all work together to provide you with the care you need, when you need it.  We recommend signing up for the patient portal called "MyChart".  Sign up information is provided on this After Visit Summary.  MyChart is used to connect with patients for Virtual Visits (Telemedicine).  Patients are able to view lab/test results, encounter notes, upcoming appointments, etc.  Non-urgent messages can be sent to your provider as well.   To learn more about what you can do with MyChart, go to [email or web address].    Your next appointment:   6 month(s)  The format for your next appointment:   In Person  Provider:   You may see Larae Grooms, MD or one of the following Advanced Practice Providers on your designated Care Team:   Melina Copa, PA-C Ermalinda Barrios, PA-C   Other Instructions Please schedule follow up appointment with Dr Rayann Heman

## 2020-07-28 NOTE — Progress Notes (Signed)
Remote ICD transmission.   

## 2020-08-04 ENCOUNTER — Telehealth: Payer: Self-pay

## 2020-08-04 NOTE — Telephone Encounter (Signed)
I spoke with pt; pt said the pain is not as bad as it was and pt has appt already scheduled for 08/10/19 with Dr Lorelei Pont. Offered pt sooner appt but thinks he wants to wait on appt with Dr Lorelei Pont. Pt will cb if needed. Sending note to DR Copland and Gentry Fitz NP as PCP.

## 2020-08-04 NOTE — Telephone Encounter (Signed)
Noted and appreciate Dr. Lillie Fragmin evaluation

## 2020-08-04 NOTE — Telephone Encounter (Signed)
[facility] - Client TELEPHONE ADVICE RECORD [facility] Patient Name: [name] Gender: Male DOB: 03/25/1945 Age: 75 Y 1 M 25 D Return Phone Number: [phone] (Primary), [phone] (Secondary) Address: City/ State/ Zip: [place] [address] Client [facility] - Client Client Site [place] - Day Physician Alma Friendly - NP Contact Type Call Who Is Calling Patient / Member / Family / Caregiver Call Type Triage / Clinical Relationship To Patient Self Return Phone Number [phone] (Primary) Chief Complaint Leg Pain Reason for Call Symptomatic / Request for [place] states he is having pain starting on his back going down towards his right leg. Translation No Nurse Assessment Nurse: Rodney Cruise, RN, Hilliard Clark Date/Time (Eastern Time): 08/04/2020 10:42:22 AM Confirm and document reason for call. If symptomatic, describe symptoms. ---Caller states having pain in right butt cheek that goes down into leg, can only sit for 20 minutes, hard to stand up because hurt leg. Denies recent injury, thinks pulled a muscle. COVID one month ago. Does the patient have any new or worsening symptoms? ---Yes Will a triage be completed? ---Yes Related visit to physician within the last 2 weeks? ---No Does the PT have any chronic conditions? (i.e. diabetes, asthma, this includes High risk factors for pregnancy, etc.) ---No Is this a behavioral health or substance abuse call? ---No Guidelines Guideline Title Affirmed Question Affirmed Notes Nurse Date/Time (Eastern Time) Back Pain [1] Pain radiates into the thigh or further down the leg AND [2] one leg Baxter, RN, Sean 08/04/2020 10:44:58 AM Disp. Time Eilene Ghazi Time) Disposition Final User 08/04/2020 10:47:54 AM SEE PCP WITHIN 3 DAYS Yes Baxter, RN, Hilliard Clark PLEASE NOTE: All timestamps contained within this report are  represented as [place] Standard Time. CONFIDENTIALTY NOTICE: This fax transmission is intended only for the addressee. It contains information that is legally privileged, confidential or otherwise protected from use or disclosure. If you are not the intended recipient, you are strictly prohibited from reviewing, disclosing, copying using or disseminating any of this information or taking any action in reliance on or regarding this information. If you have received this fax in error, please notify us immediately by telephone so that we can arrange for its return to [place]. Phone: [phone], Toll-Free: [phone], Fax: [phone] Page: 2 of 2 Call Id: [id number] Caller Disagree/Comply Comply Caller Understands Yes PreDisposition Did not know what to do Care Advice Given Per Guideline SEE PCP WITHIN 3 DAYS: PAIN MEDICINES: * For pain relief, you can take either acetaminophen, ibuprofen, or naproxen. CALL BACK IF: CARE ADVICE given per Back Pain (Adult) guideline.

## 2020-08-09 ENCOUNTER — Encounter: Payer: Self-pay | Admitting: Family Medicine

## 2020-08-09 ENCOUNTER — Other Ambulatory Visit: Payer: Self-pay

## 2020-08-09 ENCOUNTER — Ambulatory Visit (INDEPENDENT_AMBULATORY_CARE_PROVIDER_SITE_OTHER): Payer: Medicare Other | Admitting: Family Medicine

## 2020-08-09 VITALS — BP 130/70 | HR 96 | Temp 98.2°F | Ht 68.0 in | Wt 213.8 lb

## 2020-08-09 DIAGNOSIS — N182 Chronic kidney disease, stage 2 (mild): Secondary | ICD-10-CM

## 2020-08-09 DIAGNOSIS — M5416 Radiculopathy, lumbar region: Secondary | ICD-10-CM

## 2020-08-09 DIAGNOSIS — Z794 Long term (current) use of insulin: Secondary | ICD-10-CM

## 2020-08-09 DIAGNOSIS — I255 Ischemic cardiomyopathy: Secondary | ICD-10-CM

## 2020-08-09 DIAGNOSIS — E1122 Type 2 diabetes mellitus with diabetic chronic kidney disease: Secondary | ICD-10-CM | POA: Diagnosis not present

## 2020-08-09 MED ORDER — PREDNISONE 20 MG PO TABS
20.0000 mg | ORAL_TABLET | Freq: Every day | ORAL | 0 refills | Status: DC
Start: 2020-08-09 — End: 2020-08-25

## 2020-08-09 MED ORDER — TIZANIDINE HCL 4 MG PO TABS: 2.0000 mg | ORAL_TABLET | Freq: Every evening | ORAL | 0 refills | Status: DC | PRN

## 2020-08-09 NOTE — Progress Notes (Signed)
[name] Edler T. Avanna Sowder, MD, [place] [place], [address]  Phone: [phone]  FAX: [name] - 75 y.o. male  MRN [id number]  Date of Birth: 1945-08-29  Date: 08/09/2020  PCP: [name], NP  Referral: [name], NP  Chief Complaint  Patient presents with   Sciatica    Right    This visit occurred during the SARS-CoV-2 public health emergency.  Safety protocols were in place, including screening questions prior to the visit, additional usage of staff PPE, and extensive cleaning of exam room while observing appropriate contact time as indicated for disinfecting solutions.   Subjective:   [name] is a 75 y.o. very pleasant male patient who presents with the following: Back Pain  ongoing for approximately: 10 d The patient has had back pain before. The back pain is localized into the lumbar spine area. They also describe no radiculopathy.  R lumbar rad and hurting for about 10 days.   Failed NSAIDS  55 units of NPH Then extra of R during the day  ROM with golf  Incline bad Decline ok  50 years ago, he did hurt his back.   More on the R with rotation   No numbness or tingling. No bowel or bladder incontinence. No focal weakness. Prior interventions: none Physical therapy: No Chiropractic manipulations: No Acupuncture: No Osteopathic manipulation: No Heat or cold: Minimal effect   Review of Systems is noted in the HPI, as appropriate  Objective:   Blood pressure 130/70, pulse 96, temperature 98.2 F (36.8 C), temperature source Temporal, height 5' 8"  (1.727 m), weight 213 lb 12 oz (97 kg), SpO2 96 %.  GEN: No acute distress; alert,appropriate. PULM: Breathing comfortably in no respiratory distress PSYCH: Normally interactive.   Range of motion at  the waist: Flexion, rotation and lateral bending: Grossly normal with some pain with rotational  movement  No echymosis or edema Rises to examination table with no difficulty Gait: minimally antalgic  Inspection/Deformity: No abnormality Paraspinus T: Mildly tender, right greater than left L3-S1  B Ankle Dorsiflexion (L5,4): 5/5 B Great Toe Dorsiflexion (L5,4): 5/5 Heel Walk (L5): WNL Toe Walk (S1): WNL Rise/Squat (L4): WNL, mild pain  SENSORY B Medial Foot (L4): WNL B Dorsum (L5): WNL B Lateral (S1): WNL Light Touch: WNL Pinprick: WNL  REFLEXES Knee (L4): 2+ Ankle (S1): 2+  B SLR, seated: neg B SLR, supine: neg B FABER: neg B Reverse FABER: neg B Greater Troch: NT B Log Roll: neg B Sciatic Notch: NT  Radiology: No results found.  Assessment and Plan:     ICD-10-CM   1. Right lumbar radiculopathy  M54.16     2. Type 2 diabetes mellitus with stage 2 chronic kidney disease, with long-term current use of insulin (HCC)  E11.22    N18.2    Z79.4      Acute on chronic back pain, now with right-sided radiculopathy.  No neurovascular compromise.  Failure NSAIDs.  Burst of low-dose steroids as well as Flexeril at night, and also gave him range of motion rehab protocol.  Blood sugars have been running in the early 100s, and I think they should be able to tolerate this.  Anatomy reviewed. Conservative algorithms for acute back pain generally begin with the following: NSAIDS, Muscle Relaxants, Mild pain medication Start with medications, core rehab, and progress from there following low back pain algorithm. No red flags are  present.  Follow-up: No follow-ups on file.  Meds ordered this encounter  Medications   predniSONE (DELTASONE) 20 MG tablet    Sig: Take 1 tablet (20 mg total) by mouth daily with breakfast.    Dispense:  7 tablet    Refill:  0   tiZANidine (ZANAFLEX) 4 MG tablet    Sig: Take 0.5-1 tablets (2-4 mg total) by mouth at bedtime as needed for muscle spasms.    Dispense:  30 tablet    Refill:  0   There are no discontinued medications. No  orders of the defined types were placed in this encounter.   Signed,  [name]. Trissa Molina, MD   Outpatient Encounter Medications as of 08/09/2020  Medication Sig   acetaminophen (TYLENOL) 325 MG tablet Take 1-2 tablets (325-650 mg total) by mouth every 4 (four) hours as needed for mild pain.   apixaban (ELIQUIS) 5 MG TABS tablet Take 1 tablet (5 mg total) by mouth 2 (two) times daily.   carvedilol (COREG) 6.25 MG tablet TAKE 1 TABLET BY MOUTH TWICE A DAY WITH MEAL   ezetimibe (ZETIA) 10 MG tablet Take 1 tablet (10 mg total) by mouth daily.   glipiZIDE (GLUCOTROL XL) 10 MG 24 hr tablet Take 1 tablet (10 mg total) by mouth daily with breakfast. For diabetes.   insulin NPH Human (NOVOLIN N RELION) 100 UNIT/ML injection Inject 55 units into the skin every morning and 55 units into the skin in the evening.   Insulin Pen Needle 31G X 8 MM MISC Use as directed with Lantus.   insulin regular (NOVOLIN R) 100 units/mL injection Inject 0.25 mLs (25 Units total) into the skin 3 (three) times daily before meals.   isosorbide mononitrate (IMDUR) 30 MG 24 hr tablet Take 1 tablet (30 mg total) by mouth daily. Please make overdue appt with Dr. Irish Lack before anymore refills. Thank you 2nd attempt   lisinopril (ZESTRIL) 5 MG tablet Take 1 tablet (5 mg total) by mouth daily. Please keep upcoming appt for future refills.   nitroGLYCERIN (NITROSTAT) 0.4 MG SL tablet Place 1 tablet (0.4 mg total) under the tongue every 5 (five) minutes as needed for chest pain.   OVER THE COUNTER MEDICATION Place 1 drop into both eyes 2 (two) times daily. liquid msm eye drops/ for floaters in the eyes   predniSONE (DELTASONE) 20 MG tablet Take 1 tablet (20 mg total) by mouth daily with breakfast.   tiZANidine (ZANAFLEX) 4 MG tablet Take 0.5-1 tablets (2-4 mg total) by mouth at bedtime as needed for muscle spasms.   No facility-administered encounter medications on file as of 08/09/2020.

## 2020-08-18 ENCOUNTER — Other Ambulatory Visit: Payer: Medicare Other | Admitting: *Deleted

## 2020-08-18 ENCOUNTER — Ambulatory Visit (INDEPENDENT_AMBULATORY_CARE_PROVIDER_SITE_OTHER): Payer: Medicare Other | Admitting: Internal Medicine

## 2020-08-18 ENCOUNTER — Other Ambulatory Visit: Payer: Self-pay

## 2020-08-18 VITALS — BP 136/70 | HR 80 | Ht 68.0 in | Wt 213.1 lb

## 2020-08-18 DIAGNOSIS — I25810 Atherosclerosis of coronary artery bypass graft(s) without angina pectoris: Secondary | ICD-10-CM | POA: Diagnosis not present

## 2020-08-18 DIAGNOSIS — Z87898 Personal history of other specified conditions: Secondary | ICD-10-CM | POA: Diagnosis not present

## 2020-08-18 DIAGNOSIS — I428 Other cardiomyopathies: Secondary | ICD-10-CM

## 2020-08-18 DIAGNOSIS — I255 Ischemic cardiomyopathy: Secondary | ICD-10-CM | POA: Diagnosis not present

## 2020-08-18 DIAGNOSIS — I471 Supraventricular tachycardia: Secondary | ICD-10-CM | POA: Diagnosis not present

## 2020-08-18 DIAGNOSIS — I5022 Chronic systolic (congestive) heart failure: Secondary | ICD-10-CM

## 2020-08-18 LAB — CUP PACEART INCLINIC DEVICE CHECK
Battery Remaining Longevity: 127 mo
Battery Voltage: 3.03 V
Brady Statistic RV Percent Paced: 0.02 %
Date Time Interrogation Session: 20220817100056
HighPow Impedance: 73 Ohm
Implantable Lead Implant Date: 20210105
Implantable Lead Location: 753860
Implantable Pulse Generator Implant Date: 20210105
Lead Channel Impedance Value: 399 Ohm
Lead Channel Impedance Value: 494 Ohm
Lead Channel Pacing Threshold Amplitude: 0.5 V
Lead Channel Pacing Threshold Pulse Width: 0.4 ms
Lead Channel Sensing Intrinsic Amplitude: 10.875 mV
Lead Channel Sensing Intrinsic Amplitude: 11.875 mV
Lead Channel Setting Pacing Amplitude: 2.5 V
Lead Channel Setting Pacing Pulse Width: 0.4 ms
Lead Channel Setting Sensing Sensitivity: 0.3 mV

## 2020-08-18 LAB — HEPATIC FUNCTION PANEL
ALT: 37 IU/L (ref 0–44)
AST: 16 IU/L (ref 0–40)
Albumin: 4.6 g/dL (ref 3.7–4.7)
Alkaline Phosphatase: 69 IU/L (ref 44–121)
Bilirubin Total: 0.3 mg/dL (ref 0.0–1.2)
Bilirubin, Direct: 0.14 mg/dL (ref 0.00–0.40)
Total Protein: 7 g/dL (ref 6.0–8.5)

## 2020-08-18 LAB — LIPID PANEL
Chol/HDL Ratio: 4.4 ratio (ref 0.0–5.0)
Cholesterol, Total: 193 mg/dL (ref 100–199)
HDL: 44 mg/dL (ref >39)
LDL Chol Calc (NIH): 122 mg/dL — ABNORMAL HIGH (ref 0–99)
Triglycerides: 149 mg/dL (ref 0–149)
VLDL Cholesterol Cal: 27 mg/dL (ref 5–40)

## 2020-08-18 NOTE — Progress Notes (Signed)
PCP: [name], NP Primary Cardiologist: Dr Irish Lack Primary EP: Dr Verdis Frederickson is a 75 y.o. male who presents today for routine electrophysiology followup.  Since last being seen in our clinic, the patient reports doing very well.  Today, he denies symptoms of palpitations, chest pain, shortness of breath,  lower extremity edema, dizziness, presyncope, syncope, or ICD shocks.  The patient is otherwise without complaint today.   Past Medical History:  Diagnosis Date   Arthritis    "fingers, shoulders" (08/03/2015)   Atrial fibrillation (HCC)    Paroxysmal, rare episodes, sinus rhythm on flecainide, patient prefers not to take Coumadin   Bradycardia    April, 2013   Chest pain    Nuclear, 2006, no ischemia   Chronic lower back pain    "all my life"   Coronary artery disease    s/p staged cath 05/12/2014 and 5/11, DES x 2 to heavily calcified RCA, residual with 60% prox LAD, 70% D1   Ejection fraction    EF 55-60%,  echo, January, 2011   Elevated CPK    CPK elevated with normal MB and normal troponin the past   Heart murmur    History of echocardiogram    Echo 8/16: EF 55%, normal wall motion, grade 2 diastolic dysfunction, trivial MR, normal RV function, PASP 27 mmHg   Hypertension    IBS (irritable bowel syndrome)    Myocardial infarction ([place]) 06/2014   Sinus drainage    Type II diabetes mellitus ([place])    Past Surgical History:  Procedure Laterality Date   APPENDECTOMY     CARDIAC CATHETERIZATION N/A 05/12/2014   Procedure: Left Heart Cath and Coronary Angiography;  Surgeon: Troy Sine, MD;  Location: [facility];  Service: Cardiovascular;  Laterality: N/A;   CARDIAC CATHETERIZATION N/A 05/13/2014   Procedure: Coronary/Graft Atherectomy;  Surgeon: Troy Sine, MD;  Location: [place] [facility];  Service: Cardiovascular;  Laterality: N/A;   CARDIAC CATHETERIZATION Right 05/13/2014   Procedure: Temporary Pacemaker;  Surgeon: Troy Sine, MD;   Location: [place] [facility];  Service: Cardiovascular;  Laterality: Right;   CARDIAC CATHETERIZATION N/A 05/13/2014   Procedure: Coronary Stent Intervention;  Surgeon: Troy Sine, MD;  Location: [place] [place] [facility];  Service: Cardiovascular;  Laterality: N/A;   CARDIAC CATHETERIZATION N/A 06/18/2014   Procedure: Left Heart Cath and Coronary Angiography;  Surgeon: Peter M Martinique, MD;  Location: [place] [facility];  Service: Cardiovascular;  Laterality: N/A;   CARDIAC CATHETERIZATION N/A 08/03/2015   Procedure: Left Heart Cath and Cors/Grafts Angiography;  Surgeon: Nelva Bush, MD;  Location: [facility];  Service: Cardiovascular;  Laterality: N/A;   CARDIOVASCULAR STRESS TEST  05/06/2014   CATARACT EXTRACTION W/ INTRAOCULAR LENS IMPLANT Left    COLONOSCOPY WITH PROPOFOL N/A 02/13/2020   Procedure: COLONOSCOPY WITH PROPOFOL;  Surgeon: Carol Ada, MD;  [facility];  Service: Endoscopy;  Laterality: N/A;   CORONARY ARTERY BYPASS GRAFT N/A 06/22/2014   Procedure: CORONARY ARTERY BYPASS GRAFTING (CABG) x five, using left internal mammary artery, and right leg greater saphenous vein harvested endoscopically;  Surgeon: Melrose Nakayama, MD;  Location: [place];  Service: Open Heart Surgery;  Laterality: N/A;   ICD IMPLANT N/A 01/07/2019   Procedure: ICD IMPLANT;  Surgeon: Thompson Grayer, MD;  Location: [facility];  Service: Cardiovascular;  Laterality: N/A;   LAPAROSCOPIC CHOLECYSTECTOMY     LEFT HEART CATH AND CORS/GRAFTS ANGIOGRAPHY  N/A 01/06/2019   Procedure: LEFT HEART CATH AND CORS/GRAFTS ANGIOGRAPHY;  Surgeon: Nelva Bush, MD;  Location: [place] [facility];  Service: Cardiovascular;  Laterality: N/A;   MAZE N/A 06/22/2014   Procedure: MAZE;  Surgeon: Melrose Nakayama, MD;  Location: [place];  Service: Open Heart Surgery;  Laterality: N/A;   POLYPECTOMY  02/13/2020   Procedure: POLYPECTOMY;  Surgeon: Carol Ada, MD;  [facility];  Service: Endoscopy;;    TEE WITHOUT CARDIOVERSION N/A 06/22/2014   Procedure: TRANSESOPHAGEAL ECHOCARDIOGRAM (TEE);  Surgeon: Melrose Nakayama, MD;  Location: [place];  Service: Open Heart Surgery;  Laterality: N/A;    ROS- all systems are reviewed and negative except as per HPI above  Current Outpatient Medications  Medication Sig Dispense Refill   acetaminophen (TYLENOL) 325 MG tablet Take 1-2 tablets (325-650 mg total) by mouth every 4 (four) hours as needed for mild pain.     apixaban (ELIQUIS) 5 MG TABS tablet Take 1 tablet (5 mg total) by mouth 2 (two) times daily. 60 tablet 5   carvedilol (COREG) 6.25 MG tablet TAKE 1 TABLET BY MOUTH TWICE A DAY WITH MEAL 30 tablet 0   ezetimibe (ZETIA) 10 MG tablet Take 1 tablet (10 mg total) by mouth daily. 30 tablet 11   glipiZIDE (GLUCOTROL XL) 10 MG 24 hr tablet Take 1 tablet (10 mg total) by mouth daily with breakfast. For diabetes. 90 tablet 3   insulin NPH Human (NOVOLIN N RELION) 100 UNIT/ML injection Inject 55 units into the skin every morning and 55 units into the skin in the evening. 30 mL 5   Insulin Pen Needle 31G X 8 MM MISC Use as directed with Lantus. 100 each 5   insulin regular (NOVOLIN R) 100 units/mL injection Inject 0.25 mLs (25 Units total) into the skin 3 (three) times daily before meals. 60 mL 2   isosorbide mononitrate (IMDUR) 30 MG 24 hr tablet Take 1 tablet (30 mg total) by mouth daily. Please make overdue appt with Dr. Irish Lack before anymore refills. Thank you 2nd attempt 15 tablet 0   lisinopril (ZESTRIL) 5 MG tablet Take 1 tablet (5 mg total) by mouth daily. Please keep upcoming appt for future refills. 90 tablet 0   nitroGLYCERIN (NITROSTAT) 0.4 MG SL tablet Place 1 tablet (0.4 mg total) under the tongue every 5 (five) minutes as needed for chest pain. 25 tablet 6   OVER THE COUNTER MEDICATION Place 1 drop into both eyes 2 (two) times daily. liquid msm eye drops/ for floaters in the eyes     predniSONE (DELTASONE) 20 MG tablet Take 1 tablet (20  mg total) by mouth daily with breakfast. 7 tablet 0   tiZANidine (ZANAFLEX) 4 MG tablet Take 0.5-1 tablets (2-4 mg total) by mouth at bedtime as needed for muscle spasms. 30 tablet 0   No current facility-administered medications for this visit.    Physical Exam: Vitals:   08/18/20 0945  BP: 136/70  Pulse: 80  SpO2: 98%  Weight: 213 lb 1 oz (96.6 kg)  Height: 5' 8"  (1.727 m)    GEN- The patient is well appearing, alert and oriented x 3 today.   Head- normocephalic, atraumatic Eyes-  Sclera clear, conjunctiva pink Ears- hearing intact Oropharynx- clear Lungs- Clear to ausculation bilaterally, normal work of breathing Chest- ICD pocket is well healed Heart- Regular rate and rhythm, no murmurs, rubs or gallops, PMI not laterally displaced GI- soft, NT, ND, + BS Extremities- no clubbing, cyanosis, or  edema  ICD interrogation- reviewed in detail today,  See PACEART report   Wt Readings from Last 3 Encounters:  08/18/20 213 lb 1 oz (96.6 kg)  08/09/20 213 lb 12 oz (97 kg)  07/07/20 214 lb 9.6 oz (97.3 kg)    Assessment and Plan:  1.  Chronic systolic dysfunction/ CAD/ mixied ischemic/ nonischemic CM euvolemic today No ischemic symptoms Stable on an appropriate medical regimen Normal ICD function See [facility] report No changes today he is not device dependant today followed in ICM device clinic Would consider entresto and SGTL2 inhibitor.  Will defer to Dr Irish Lack.  2. Afib S/p MAZE and LAA clip AF burden is <1% He is doing well with eliquis  3. HL Continue zetia 61m daily  4. HTN Continue lisinopril 521mdaily   Risks, benefits and potential toxicities for medications prescribed and/or refilled reviewed with patient today.   Return in a year to see EP APP  JaThompson GrayerD, FASween[facility]/17/2022 10:58 AM

## 2020-08-18 NOTE — Patient Instructions (Addendum)
Medication Instructions:  Your physician recommends that you continue on your current medications as directed. Please refer to the Current Medication list given to you today.  Labwork: None ordered.  Testing/Procedures: None ordered.  Follow-Up: Your physician wants you to follow-up in: 12 months with Thompson Grayer, MD or one of the following Advanced Practice Providers on your designated Care Team:     Legrand Como "Jonni Sanger" Chalmers Cater, [place]   You will receive a reminder letter in the mail two months in advance. If you don't receive a letter, please call our office to schedule the follow-up appointment.  Remote monitoring is used to monitor your Pacemaker of ICD from home. This monitoring reduces the number of office visits required to check your device to one time per year. It allows [place] to keep an eye on the functioning of your device to ensure it is working properly. You are scheduled for a device check from home on 08/30/20. You may send your transmission at any time that day. If you have a wireless device, the transmission will be sent automatically. After your physician reviews your transmission, you will receive a postcard with your next transmission date.  Any Other Special Instructions Will Be Listed Below (If Applicable).  If you need a refill on your cardiac medications before your next appointment, please call your pharmacy.

## 2020-08-25 ENCOUNTER — Encounter: Payer: Self-pay | Admitting: Primary Care

## 2020-08-25 ENCOUNTER — Other Ambulatory Visit: Payer: Self-pay

## 2020-08-25 ENCOUNTER — Telehealth: Payer: Self-pay | Admitting: Primary Care

## 2020-08-25 ENCOUNTER — Ambulatory Visit (INDEPENDENT_AMBULATORY_CARE_PROVIDER_SITE_OTHER): Payer: Medicare Other | Admitting: Primary Care

## 2020-08-25 VITALS — BP 134/68 | HR 82 | Temp 98.4°F | Ht 67.0 in | Wt 211.0 lb

## 2020-08-25 DIAGNOSIS — M5416 Radiculopathy, lumbar region: Secondary | ICD-10-CM

## 2020-08-25 DIAGNOSIS — N182 Chronic kidney disease, stage 2 (mild): Secondary | ICD-10-CM

## 2020-08-25 DIAGNOSIS — I255 Ischemic cardiomyopathy: Secondary | ICD-10-CM | POA: Diagnosis not present

## 2020-08-25 DIAGNOSIS — Z794 Long term (current) use of insulin: Secondary | ICD-10-CM | POA: Diagnosis not present

## 2020-08-25 DIAGNOSIS — E1122 Type 2 diabetes mellitus with diabetic chronic kidney disease: Secondary | ICD-10-CM | POA: Diagnosis not present

## 2020-08-25 DIAGNOSIS — N400 Enlarged prostate without lower urinary tract symptoms: Secondary | ICD-10-CM

## 2020-08-25 LAB — POCT GLYCOSYLATED HEMOGLOBIN (HGB A1C): Hemoglobin A1C: 9.3 % — AB (ref 4.0–5.6)

## 2020-08-25 MED ORDER — TIZANIDINE HCL 4 MG PO TABS: 2.0000 mg | ORAL_TABLET | Freq: Every evening | ORAL | 0 refills | Status: DC | PRN

## 2020-08-25 MED ORDER — TAMSULOSIN HCL 0.4 MG PO CAPS: 0.4000 mg | ORAL_CAPSULE | Freq: Every day | ORAL | 1 refills | Status: DC

## 2020-08-25 MED ORDER — DAPAGLIFLOZIN PROPANEDIOL 5 MG PO TABS: 5.0000 mg | ORAL_TABLET | Freq: Every day | ORAL | 1 refills | Status: DC

## 2020-08-25 NOTE — Progress Notes (Signed)
Subjective:    Patient ID: [name], male    DOB: 01-Sep-1945, 75 y.o.   MRN: [id number]  HPI  [name] is a very pleasant 75 y.o. male with a history of type 2 diabetes, atrial fibrillation, CAD, NASH, BPH, hyperlipidemia who presents today for follow up of diabetes.  Current medications include: Glipizide XL 10 mg daily, Novolin N 55 units BID, Novolin R 25 units TID with meals.  He had Covid-19 infection in June 2022, stopped taking Novolin R up until one week ago. He was compliant to all of his other medications including Novolin N 55 units BID.  He is checking his blood glucose 1-2 times daily and is getting readings of 130's-180's.  Last A1C: 9.3 in May 2022 and 9.3 today Last Eye Exam: UTD Last Foot Exam: Due Pneumonia Vaccination: 2018 Urine Microalbumin: None. Managed on ACE-I Statin: None. Intolerant  Dietary changes since last visit: None.    Exercise: Playing golf 1-2 times weekly    Review of Systems  Eyes:  Negative for visual disturbance.  Respiratory:  Negative for shortness of breath.   Cardiovascular:  Negative for chest pain.  Neurological:  Negative for numbness.        Past Medical History:  Diagnosis Date   Arthritis    "fingers, shoulders" (08/03/2015)   Atrial fibrillation (HCC)    Paroxysmal, rare episodes, sinus rhythm on flecainide, patient prefers not to take Coumadin   Bradycardia    April, 2013   Chest pain    Nuclear, 2006, no ischemia   Chronic lower back pain    "all my life"   Coronary artery disease    s/p staged cath 05/12/2014 and 5/11, DES x 2 to heavily calcified RCA, residual with 60% prox LAD, 70% D1   Ejection fraction    EF 55-60%,  echo, January, 2011   Elevated CPK    CPK elevated with normal MB and normal troponin the past   Heart murmur    History of echocardiogram    Echo 8/16: EF 55%, normal wall motion, grade 2 diastolic dysfunction, trivial MR, normal RV function, PASP 27 mmHg   Hypertension    IBS  (irritable bowel syndrome)    Myocardial infarction ([place]) 06/2014   Sinus drainage    Type II diabetes mellitus ([place])     Social History   Socioeconomic History   Marital status: Married    Spouse name: Not on file   Number of children: Not on file   Years of education: Not on file   Highest education level: Not on file  Occupational History   Not on file  Tobacco Use   Smoking status: Never   Smokeless tobacco: Never  Vaping Use   Vaping Use: Never used  Substance and Sexual Activity   Alcohol use: No   Drug use: No   Sexual activity: Not on file  Other Topics Concern   Not on file  Social History Narrative   Not on file   Social Determinants of Health   Financial Resource Strain: Not on file  Food Insecurity: Not on file  Transportation Needs: Not on file  Physical Activity: Not on file  Stress: Not on file  Social Connections: Not on file  Intimate Partner Violence: Not on file    Past Surgical History:  Procedure Laterality Date   APPENDECTOMY     CARDIAC CATHETERIZATION N/A 05/12/2014   Procedure: Left Heart Cath and Coronary Angiography;  Surgeon: Marcello Moores  Floyce Stakes, MD;  Location: [facility];  Service: Cardiovascular;  Laterality: N/A;   CARDIAC CATHETERIZATION N/A 05/13/2014   Procedure: Coronary/Graft Atherectomy;  Surgeon: Troy Sine, MD;  Location: [facility];  Service: Cardiovascular;  Laterality: N/A;   CARDIAC CATHETERIZATION Right 05/13/2014   Procedure: Temporary Pacemaker;  Surgeon: Troy Sine, MD;  Location: [place] [facility];  Service: Cardiovascular;  Laterality: Right;   CARDIAC CATHETERIZATION N/A 05/13/2014   Procedure: Coronary Stent Intervention;  Surgeon: Troy Sine, MD;  Location: [facility];  Service: Cardiovascular;  Laterality: N/A;   CARDIAC CATHETERIZATION N/A 06/18/2014   Procedure: Left Heart Cath and Coronary Angiography;  Surgeon: Peter M Martinique, MD;  Location: [facility];  Service:  Cardiovascular;  Laterality: N/A;   CARDIAC CATHETERIZATION N/A 08/03/2015   Procedure: Left Heart Cath and Cors/Grafts Angiography;  Surgeon: Nelva Bush, MD;  Location: [facility];  Service: Cardiovascular;  Laterality: N/A;   CARDIOVASCULAR STRESS TEST  05/06/2014   CATARACT EXTRACTION W/ INTRAOCULAR LENS IMPLANT Left    COLONOSCOPY WITH PROPOFOL N/A 02/13/2020   Procedure: COLONOSCOPY WITH PROPOFOL;  Surgeon: Carol Ada, MD;  [facility];  Service: Endoscopy;  Laterality: N/A;   CORONARY ARTERY BYPASS GRAFT N/A 06/22/2014   Procedure: CORONARY ARTERY BYPASS GRAFTING (CABG) x five, using left internal mammary artery, and right leg greater saphenous vein harvested endoscopically;  Surgeon: Melrose Nakayama, MD;  Location: [place];  Service: Open Heart Surgery;  Laterality: N/A;   ICD IMPLANT N/A 01/07/2019   Procedure: ICD IMPLANT;  Surgeon: Thompson Grayer, MD;  Location: [facility];  Service: Cardiovascular;  Laterality: N/A;   LAPAROSCOPIC CHOLECYSTECTOMY     LEFT HEART CATH AND CORS/GRAFTS ANGIOGRAPHY N/A 01/06/2019   Procedure: LEFT HEART CATH AND CORS/GRAFTS ANGIOGRAPHY;  Surgeon: Nelva Bush, MD;  Location: [facility] [facility];  Service: Cardiovascular;  Laterality: N/A;   MAZE N/A 06/22/2014   Procedure: MAZE;  Surgeon: Melrose Nakayama, MD;  Location: [place] [place];  Service: Open Heart Surgery;  Laterality: N/A;   POLYPECTOMY  02/13/2020   Procedure: POLYPECTOMY;  Surgeon: Carol Ada, MD;  [facility];  Service: Endoscopy;;   TEE WITHOUT CARDIOVERSION N/A 06/22/2014   Procedure: TRANSESOPHAGEAL ECHOCARDIOGRAM (TEE);  Surgeon: Melrose Nakayama, MD;  Location: [place];  Service: Open Heart Surgery;  Laterality: N/A;    Family History  Problem Relation Age of Onset   Alzheimer's disease Mother    Emphysema Father    Cancer Brother    Arrhythmia Sister    Heart attack Sister    Heart disease Sister    Hyperlipidemia Sister    Hypertension  Sister     Allergies  Allergen Reactions   Lipitor [Atorvastatin] Other (See Comments)    Myopathy Spring 2006   Pravastatin Other (See Comments)    LEG CRAMPS   Simvastatin Other (See Comments)    LEG CRAMPS    Current Outpatient Medications on File Prior to Visit  Medication Sig Dispense Refill   acetaminophen (TYLENOL) 325 MG tablet Take 1-2 tablets (325-650 mg total) by mouth every 4 (four) hours as needed for mild pain.     apixaban (ELIQUIS) 5 MG TABS tablet Take 1 tablet (5 mg total) by mouth 2 (two) times daily. 60 tablet 5   carvedilol (COREG) 6.25 MG tablet TAKE 1 TABLET BY MOUTH TWICE A DAY WITH MEAL 30 tablet 0   ezetimibe (ZETIA) 10 MG  tablet Take 1 tablet (10 mg total) by mouth daily. 30 tablet 11   insulin NPH Human (NOVOLIN N RELION) 100 UNIT/ML injection Inject 55 units into the skin every morning and 55 units into the skin in the evening. 30 mL 5   Insulin Pen Needle 31G X 8 MM MISC Use as directed with Lantus. 100 each 5   insulin regular (NOVOLIN R) 100 units/mL injection Inject 0.25 mLs (25 Units total) into the skin 3 (three) times daily before meals. 60 mL 2   lisinopril (ZESTRIL) 5 MG tablet Take 1 tablet (5 mg total) by mouth daily. Please keep upcoming appt for future refills. 90 tablet 0   nitroGLYCERIN (NITROSTAT) 0.4 MG SL tablet Place 1 tablet (0.4 mg total) under the tongue every 5 (five) minutes as needed for chest pain. 25 tablet 6   OVER THE COUNTER MEDICATION Place 1 drop into both eyes 2 (two) times daily. liquid msm eye drops/ for floaters in the eyes     tiZANidine (ZANAFLEX) 4 MG tablet Take 0.5-1 tablets (2-4 mg total) by mouth at bedtime as needed for muscle spasms. 30 tablet 0   glipiZIDE (GLUCOTROL XL) 10 MG 24 hr tablet Take 1 tablet (10 mg total) by mouth daily with breakfast. For diabetes. 90 tablet 3   isosorbide mononitrate (IMDUR) 30 MG 24 hr tablet Take 1 tablet (30 mg total) by mouth daily. Please make overdue appt with Dr. Irish Lack before  anymore refills. Thank you 2nd attempt (Patient not taking: Reported on 08/25/2020) 15 tablet 0   No current facility-administered medications on file prior to visit.    BP 134/68   Pulse 82   Temp 98.4 F (36.9 C) (Temporal)   Ht 5' 7"  (1.702 m)   Wt 211 lb (95.7 kg)   SpO2 96%   BMI 33.05 kg/m  Objective:   Physical Exam Cardiovascular:     Rate and Rhythm: Normal rate and regular rhythm.  Pulmonary:     Effort: Pulmonary effort is normal.     Breath sounds: Normal breath sounds. No wheezing or rales.  Musculoskeletal:     Cervical back: Neck supple.  Skin:    General: Skin is warm and dry.  Neurological:     Mental Status: He is alert and oriented to person, place, and time.          Assessment & Plan:      This visit occurred during the SARS-CoV-2 public health emergency.  Safety protocols were in place, including screening questions prior to the visit, additional usage of staff PPE, and extensive cleaning of exam room while observing appropriate contact time as indicated for disinfecting solutions.

## 2020-08-25 NOTE — Progress Notes (Signed)
Established Patient Office Visit  Subjective:  Patient ID: [name], --male    DOB: 18-Oct-1945  Age: 75 y.o. MRN: [id number] CC:  Chief Complaint  Patient presents with   Diabetes    Home readings     HPI [name] is a 75 y/o male, reports feeling "generally well". Reports that he has been managing his diabetes well until he contracted COVID in June 2022. Stopped Novolin R after COVID. He has resumed this in the last week. Performs daily foot exams. Remains uncontrolled with A1C 9.3 today.   Current medications include:  Novolin N 55 units BID, Novolin R units 3x/day before meals, and glipizide XL 10 mg daily  He is checking his/her blood glucose 2 times daily and is getting readings of between 90-170, which he reports as elevated since contracting COVID in June 2022.   Last A1C: 9.04 May 2020, 9.3 today Last Eye Exam: up to date Last Foot Exam: due, 10/10 bilateral monofilament exam, no lesions/wounds  Pneumonia Vaccination: up to date, 2018 Urine Microalbumin: none, on ACE-i Statin:none, intolerant   Dietary changes since last visit: No changes, typical diet includes [employer] breakfast meals, sandwiches for lunch, and protein and vegetable dinner, drinks 6-7 glasses of water per day.  Denies ETOH use and smoking.  Exercise: Golf 3x/week, 4 hours per day   Past Medical History:  Diagnosis Date   Arthritis    "fingers, shoulders" (08/03/2015)   Atrial fibrillation (HCC)    Paroxysmal, rare episodes, sinus rhythm on flecainide, patient prefers not to take Coumadin   Bradycardia    April, 2013   Chest pain    Nuclear, 2006, no ischemia   Chronic lower back pain    "all my life"   Coronary artery disease    s/p staged cath 05/12/2014 and 5/11, DES x 2 to heavily calcified RCA, residual with 60% prox LAD, 70% D1   Ejection fraction    EF 55-60%,  echo, January, 2011   Elevated CPK    CPK elevated with normal MB and normal troponin the past   Heart murmur     History of echocardiogram    Echo 8/16: EF 55%, normal wall motion, grade 2 diastolic dysfunction, trivial MR, normal RV function, PASP 27 mmHg   Hypertension    IBS (irritable bowel syndrome)    Myocardial infarction ([place]) 06/2014   Sinus drainage    Type II diabetes mellitus ([place])     Past Surgical History:  Procedure Laterality Date   APPENDECTOMY     CARDIAC CATHETERIZATION N/A 05/12/2014   Procedure: Left Heart Cath and Coronary Angiography;  Surgeon: Troy Sine, MD;  Location: [facility];  Service: Cardiovascular;  Laterality: N/A;   CARDIAC CATHETERIZATION N/A 05/13/2014   Procedure: Coronary/Graft Atherectomy;  Surgeon: Troy Sine, MD;  Location: [place] [facility];  Service: Cardiovascular;  Laterality: N/A;   CARDIAC CATHETERIZATION Right 05/13/2014   Procedure: Temporary Pacemaker;  Surgeon: Troy Sine, MD;  Location: [facility];  Service: Cardiovascular;  Laterality: Right;   CARDIAC CATHETERIZATION N/A 05/13/2014   Procedure: Coronary Stent Intervention;  Surgeon: Troy Sine, MD;  Location: [facility];  Service: Cardiovascular;  Laterality: N/A;   CARDIAC CATHETERIZATION N/A 06/18/2014   Procedure: Left Heart Cath and Coronary Angiography;  Surgeon: Peter M Martinique, MD;  Location: [facility];  Service: Cardiovascular;  Laterality: N/A;   CARDIAC CATHETERIZATION N/A 08/03/2015   Procedure:  Left Heart Cath and Cors/Grafts Angiography;  Surgeon: Nelva Bush, MD;  Location: [facility];  Service: Cardiovascular;  Laterality: N/A;   CARDIOVASCULAR STRESS TEST  05/06/2014   CATARACT EXTRACTION W/ INTRAOCULAR LENS IMPLANT Left    COLONOSCOPY WITH PROPOFOL N/A 02/13/2020   Procedure: COLONOSCOPY WITH PROPOFOL;  Surgeon: Carol Ada, MD;  Location: [facility];  Service: Endoscopy;  Laterality: N/A;   CORONARY ARTERY BYPASS GRAFT N/A 06/22/2014   Procedure: CORONARY ARTERY BYPASS GRAFTING (CABG) x five, using left internal mammary  artery, and right leg greater saphenous vein harvested endoscopically;  Surgeon: Melrose Nakayama, MD;  Location: [place];  Service: Open Heart Surgery;  Laterality: N/A;   ICD IMPLANT N/A 01/07/2019   Procedure: ICD IMPLANT;  Surgeon: Thompson Grayer, MD;  Location: [place] [facility];  Service: Cardiovascular;  Laterality: N/A;   LAPAROSCOPIC CHOLECYSTECTOMY     LEFT HEART CATH AND CORS/GRAFTS ANGIOGRAPHY N/A 01/06/2019   Procedure: LEFT HEART CATH AND CORS/GRAFTS ANGIOGRAPHY;  Surgeon: Nelva Bush, MD;  Location: [place] [facility];  Service: Cardiovascular;  Laterality: N/A;   MAZE N/A 06/22/2014   Procedure: MAZE;  Surgeon: Melrose Nakayama, MD;  Location: [facility];  Service: Open Heart Surgery;  Laterality: N/A;   POLYPECTOMY  02/13/2020   Procedure: POLYPECTOMY;  Surgeon: Carol Ada, MD;  Location: [facility];  Service: Endoscopy;;   TEE WITHOUT CARDIOVERSION N/A 06/22/2014   Procedure: TRANSESOPHAGEAL ECHOCARDIOGRAM (TEE);  Surgeon: Melrose Nakayama, MD;  Location: [facility];  Service: Open Heart Surgery;  Laterality: N/A;    Family History  Problem Relation Age of Onset   Alzheimer's disease Mother    Emphysema Father    Cancer Brother    Arrhythmia Sister    Heart attack Sister    Heart disease Sister    Hyperlipidemia Sister    Hypertension Sister     Social History   Socioeconomic History   Marital status: Married    Spouse name: Not on file   Number of children: Not on file   Years of education: Not on file   Highest education level: Not on file  Occupational History   Not on file  Tobacco Use   Smoking status: Never   Smokeless tobacco: Never  Vaping Use   Vaping Use: Never used  Substance and Sexual Activity   Alcohol use: No   Drug use: No   Sexual activity: Not on file  Other Topics Concern   Not on file  Social History Narrative   Not on file   Social Determinants of Health   Financial Resource Strain: Not on file  Food Insecurity: Not on  file  Transportation Needs: Not on file  Physical Activity: Not on file  Stress: Not on file  Social Connections: Not on file  Intimate Partner Violence: Not on file    Outpatient Medications Prior to Visit  Medication Sig Dispense Refill   acetaminophen (TYLENOL) 325 MG tablet Take 1-2 tablets (325-650 mg total) by mouth every 4 (four) hours as needed for mild pain.     apixaban (ELIQUIS) 5 MG TABS tablet Take 1 tablet (5 mg total) by mouth 2 (two) times daily. 60 tablet 5   carvedilol (COREG) 6.25 MG tablet TAKE 1 TABLET BY MOUTH TWICE A DAY WITH MEAL 30 tablet 0   ezetimibe (ZETIA) 10 MG tablet Take 1 tablet (10 mg total) by mouth daily. 30 tablet 11   insulin NPH Human (NOVOLIN N RELION) 100 UNIT/ML injection  Inject 55 units into the skin every morning and 55 units into the skin in the evening. 30 mL 5   Insulin Pen Needle 31G X 8 MM MISC Use as directed with Lantus. 100 each 5   insulin regular (NOVOLIN R) 100 units/mL injection Inject 0.25 mLs (25 Units total) into the skin 3 (three) times daily before meals. 60 mL 2   lisinopril (ZESTRIL) 5 MG tablet Take 1 tablet (5 mg total) by mouth daily. Please keep upcoming appt for future refills. 90 tablet 0   nitroGLYCERIN (NITROSTAT) 0.4 MG SL tablet Place 1 tablet (0.4 mg total) under the tongue every 5 (five) minutes as needed for chest pain. 25 tablet 6   OVER THE COUNTER MEDICATION Place 1 drop into both eyes 2 (two) times daily. liquid msm eye drops/ for floaters in the eyes     tiZANidine (ZANAFLEX) 4 MG tablet Take 0.5-1 tablets (2-4 mg total) by mouth at bedtime as needed for muscle spasms. 30 tablet 0   glipiZIDE (GLUCOTROL XL) 10 MG 24 hr tablet Take 1 tablet (10 mg total) by mouth daily with breakfast. For diabetes. 90 tablet 3   isosorbide mononitrate (IMDUR) 30 MG 24 hr tablet Take 1 tablet (30 mg total) by mouth daily. Please make overdue appt with Dr. Irish Lack before anymore refills. Thank you 2nd attempt (Patient not taking:  Reported on 08/25/2020) 15 tablet 0   predniSONE (DELTASONE) 20 MG tablet Take 1 tablet (20 mg total) by mouth daily with breakfast. 7 tablet 0   No facility-administered medications prior to visit.    Allergies  Allergen Reactions   Lipitor [Atorvastatin] Other (See Comments)    Myopathy Spring 2006   Pravastatin Other (See Comments)    LEG CRAMPS   Simvastatin Other (See Comments)    LEG CRAMPS    ROS Review of Systems  Eyes:  Negative for visual disturbance.  Respiratory: Negative.    Cardiovascular: Negative.   Gastrointestinal: Negative.   Genitourinary:  Positive for difficulty urinating.       Weak stream  Neurological:  Positive for numbness.       Bilateral feet, baseline, no progression  Psychiatric/Behavioral: Negative.       Objective:    Physical Exam Constitutional:      Appearance: Normal appearance.  Cardiovascular:     Rate and Rhythm: Normal rate and regular rhythm.  Pulmonary:     Effort: Pulmonary effort is normal.     Breath sounds: Normal breath sounds.  Skin:    General: Skin is warm and dry.  Neurological:     General: No focal deficit present.     Mental Status: He is alert and oriented to person, place, and time.  Psychiatric:        Mood and Affect: Mood normal.        Behavior: Behavior normal.    BP 134/68   Pulse 82   Temp 98.4 F (36.9 C) (Temporal)   Ht 5' 7"  (1.702 m)   Wt 211 lb (95.7 kg)   SpO2 96%   BMI 33.05 kg/m  Wt Readings from Last 3 Encounters:  08/25/20 211 lb (95.7 kg)  08/18/20 213 lb 1 oz (96.6 kg)  08/09/20 213 lb 12 oz (97 kg)     Health Maintenance Due  Topic Date Due   COVID-19 Vaccine (4 - Booster for Pfizer series) 02/28/2020   FOOT EXAM  06/17/2020   INFLUENZA VACCINE  08/02/2020    There are no  preventive care reminders to display for this patient.  Lab Results  Component Value Date   TSH 1.529 01/06/2019   Lab Results  Component Value Date   WBC 7.2 11/26/2019   HGB 13.8 11/26/2019    HCT 42.1 11/26/2019   MCV 90.7 11/26/2019   PLT 207.0 11/26/2019   Lab Results  Component Value Date   NA 141 11/26/2019   K 4.5 11/26/2019   CO2 29 11/26/2019   GLUCOSE 102 (H) 11/26/2019   BUN 17 11/26/2019   CREATININE 1.20 11/26/2019   BILITOT 0.3 08/18/2020   ALKPHOS 69 08/18/2020   AST 16 08/18/2020   ALT 37 08/18/2020   PROT 7.0 08/18/2020   ALBUMIN 4.6 08/18/2020   CALCIUM 9.2 11/26/2019   ANIONGAP 9 01/07/2019   GFR 59.59 (L) 11/26/2019   Lab Results  Component Value Date   CHOL 193 08/18/2020   Lab Results  Component Value Date   HDL 44 08/18/2020   Lab Results  Component Value Date   LDLCALC 122 (H) 08/18/2020   Lab Results  Component Value Date   TRIG 149 08/18/2020   Lab Results  Component Value Date   CHOLHDL 4.4 08/18/2020   Lab Results  Component Value Date   HGBA1C 9.3 (A) 08/25/2020      Assessment & Plan:   Problem List Items Addressed This Visit       Endocrine   DM type 2 (diabetes mellitus, type 2) ([place]) - Primary   Relevant Orders   HgB A1c (Completed)    No orders of the defined types were placed in this encounter.   Follow-up: Follow up in 3 months for diabetes check, patient advised to call the office if blood sugars are consistently above 150.    Ninfa Meeker, RN

## 2020-08-25 NOTE — Telephone Encounter (Signed)
Called patient states that the Flomax was not called into pharmacy

## 2020-08-25 NOTE — Assessment & Plan Note (Addendum)
Uncontrolled, A1C 9.3 (same as last visit)   Compliant to Novolin NPH 55 unit BID, will continue  Has been off of Nolovin R for almost 2 months r/t COVID, has now resumed for 2 weeks, will continue 25 units TID with meals   Despite attempts of gaining glycemic control and given cardiac comorbidities, will add SGLT-2 for added cardioprotection in setting of uncontrolled DM2.  Added Farxiga 5 mg daily, continue NPH 55 units BID and regular insulin 25 units TID, and Glipizide XL 10 mg.  Strongly encouraged lifestyle changes including increased exercise and dietary changes to support glycemic control.   3 month follow for diabetes check, also notified patient to call if glucose readings remain at or above 150. A1C goal <7, but <8 is okay.   Patient evaluated. Agree with assessment and plan above per Budd Palmer, DNP student.

## 2020-08-25 NOTE — Addendum Note (Signed)
Addended by: [name] on: 08/25/2020 05:44 PM   Modules accepted: Orders

## 2020-08-25 NOTE — Telephone Encounter (Signed)
Noted. Since he's been taking all along, will send refills.

## 2020-08-25 NOTE — Telephone Encounter (Signed)
Called patient was never told to stop taking and has been taking all this time. His is not sure if the [name] will be covered until it is received by pharmacy tomorrow and they run. Will let us know if not.

## 2020-08-25 NOTE — Telephone Encounter (Signed)
Please notify patient:  I proceeded to refill his tamsulosin (Flomax) this morning and it was no longer on his medication list. It looks like this was discontinued by a Dr. Carol Ada? GI?  Did someone tell him to stop taking his tamsulosin (Flomax)?  Has he been taking it since February 2022?  Also, was the dapagliflozin [name]) new diabetes medication covered by his insurance?

## 2020-08-25 NOTE — Telephone Encounter (Signed)
Pt called to follow up on a new medication that was to be called in today. Pt didn't know the medication

## 2020-08-25 NOTE — Patient Instructions (Signed)
Start new medication, [name] for diabetes, 5 mg daily at breakfast.   Continue exercise, increase as tolerated.    Scheduled follow up visit in 3 months for diabetes check.  Call sooner if blood sugars run at or above 150 consistently.  Diabetes Mellitus and Nutrition, Adult When you have diabetes, or diabetes mellitus, it is very important to have healthy eating habits because your blood sugar (glucose) levels are greatly affected by what you eat and drink. Eating healthy foods in the right amounts, at about the same times every day, can help you: Control your blood glucose. Lower your risk of heart disease. Improve your blood pressure. Reach or maintain a healthy weight. What can affect my meal plan? Every person with diabetes is different, and each person has different needs for a meal plan. Your health care provider may recommend that you work with a dietitian to make a meal plan that is best for you. Your meal plan may vary depending on factors such as: The calories you need. The medicines you take. Your weight. Your blood glucose, blood pressure, and cholesterol levels. Your activity level. Other health conditions you have, such as heart or kidney disease. How do carbohydrates affect me? Carbohydrates, also called carbs, affect your blood glucose level more than any other type of food. Eating carbs naturally raises the amount of glucose in your blood. Carb counting is a method for keeping track of how many carbs you eat. Counting carbs is important to keep your blood glucose at a healthy level,especially if you use insulin or take certain oral diabetes medicines. It is important to know how many carbs you can safely have in each meal. This is different for every person. Your dietitian can help you calculate how manycarbs you should have at each meal and for each snack. How does alcohol affect me? Alcohol can cause a sudden decrease in blood glucose (hypoglycemia), especially if you  use insulin or take certain oral diabetes medicines. Hypoglycemia can be a life-threatening condition. Symptoms of hypoglycemia, such as sleepiness, dizziness, and confusion, are similar to symptoms of having too much alcohol. Do not drink alcohol if: Your health care provider tells you not to drink. You are pregnant, may be pregnant, or are planning to become pregnant. If you drink alcohol: Do not drink on an empty stomach. Limit how much you use to: 0-1 drink a day for women. 0-2 drinks a day for men. Be aware of how much alcohol is in your drink. In the [place] one drink equals one 12 oz bottle of beer (355 mL), one 5 oz glass of wine (148 mL), or one 1 oz glass of hard liquor (44 mL). Keep yourself hydrated with water, diet soda, or unsweetened iced tea. Keep in mind that regular soda, juice, and other mixers may contain a lot of sugar and must be counted as carbs. What are tips for following this plan?  Reading food labels Start by checking the serving size on the "Nutrition Facts" label of packaged foods and drinks. The amount of calories, carbs, fats, and other nutrients listed on the label is based on one serving of the item. Many items contain more than one serving per package. Check the total grams (g) of carbs in one serving. You can calculate the number of servings of carbs in one serving by dividing the total carbs by 15. For example, if a food has 30 g of total carbs per serving, it would be equal to 2 servings of carbs.  Check the number of grams (g) of saturated fats and trans fats in one serving. Choose foods that have a low amount or none of these fats. Check the number of milligrams (mg) of salt (sodium) in one serving. Most people should limit total sodium intake to less than 2,300 mg per day. Always check the nutrition information of foods labeled as "low-fat" or "nonfat." These foods may be higher in added sugar or refined carbs and should be avoided. Talk to your dietitian to  identify your daily goals for nutrients listed on the label. Shopping Avoid buying canned, pre-made, or processed foods. These foods tend to be high in fat, sodium, and added sugar. Shop around the outside edge of the grocery store. This is where you will most often find fresh fruits and vegetables, bulk grains, fresh meats, and fresh dairy. Cooking Use low-heat cooking methods, such as baking, instead of high-heat cooking methods like deep frying. Cook using healthy oils, such as olive, canola, or sunflower oil. Avoid cooking with butter, cream, or high-fat meats. Meal planning Eat meals and snacks regularly, preferably at the same times every day. Avoid going long periods of time without eating. Eat foods that are high in fiber, such as fresh fruits, vegetables, beans, and whole grains. Talk with your dietitian about how many servings of carbs you can eat at each meal. Eat 4-6 oz (112-168 g) of lean protein each day, such as lean meat, chicken, fish, eggs, or tofu. One ounce (oz) of lean protein is equal to: 1 oz (28 g) of meat, chicken, or fish. 1 egg.  cup (62 g) of tofu. Eat some foods each day that contain healthy fats, such as avocado, nuts, seeds, and fish. What foods should I eat? Fruits Berries. Apples. Oranges. Peaches. Apricots. Plums. Grapes. Mango. Papaya.Pomegranate. Kiwi. Cherries. Vegetables Lettuce. Spinach. Leafy greens, including kale, chard, collard greens, and mustard greens. Beets. Cauliflower. Cabbage. Broccoli. Carrots. Green beans.Tomatoes. Peppers. Onions. Cucumbers. Brussels sprouts. Grains Whole grains, such as whole-wheat or whole-grain bread, crackers, tortillas,cereal, and pasta. Unsweetened oatmeal. Quinoa. Brown or wild rice. Meats and other proteins Seafood. Poultry without skin. Lean cuts of poultry and beef. Tofu. Nuts. Seeds. Dairy Low-fat or fat-free dairy products such as milk, yogurt, and cheese. The items listed above may not be a complete list of  foods and beverages you can eat. Contact a dietitian for more information. What foods should I avoid? Fruits Fruits canned with syrup. Vegetables Canned vegetables. Frozen vegetables with butter or cream sauce. Grains Refined white flour and flour products such as bread, pasta, snack foods, andcereals. Avoid all processed foods. Meats and other proteins Fatty cuts of meat. Poultry with skin. Breaded or fried meats. Processed meat.Avoid saturated fats. Dairy Full-fat yogurt, cheese, or milk. Beverages Sweetened drinks, such as soda or iced tea. The items listed above may not be a complete list of foods and beverages you should avoid. Contact a dietitian for more information. Questions to ask a health care provider Do I need to meet with a diabetes educator? Do I need to meet with a dietitian? What number can I call if I have questions? When are the best times to check my blood glucose? Where to find more information: American Diabetes Association: diabetes.org Academy of Nutrition and Dietetics: www.eatright.[employer] of Diabetes and Digestive and Kidney Diseases: [employer] Association of Diabetes Care and Education Specialists: www.diabeteseducator.org Summary It is important to have healthy eating habits because your blood sugar (glucose) levels are greatly affected by what  you eat and drink. A healthy meal plan will help you control your blood glucose and maintain a healthy lifestyle. Your health care provider may recommend that you work with a dietitian to make a meal plan that is best for you. Keep in mind that carbohydrates (carbs) and alcohol have immediate effects on your blood glucose levels. It is important to count carbs and to use alcohol carefully. This information is not intended to replace advice given to you by your health care provider. Make sure you discuss any questions you have with your healthcare provider. Document Revised: 11/26/2018 Document  Reviewed: 11/26/2018 Elsevier Patient Education  2021 [employer].

## 2020-08-26 NOTE — Telephone Encounter (Signed)
[name] called in and stated that he was told to call and speak with [name].

## 2020-08-26 NOTE — Telephone Encounter (Signed)
[name],  I am trying to get this patient on SGLT2 treatment for uncontrolled diabetes and also for cardioprotective purposes but his insurance isn't covering anything.   Can you help with assistance with [name] or Jardiance?  Also, what about Trulicity? He's done well on this in the past but cannot afford.   Thanks! [name], NP-C

## 2020-08-26 NOTE — Telephone Encounter (Signed)
Patient called in states farxiga will be over $600. Did not pick up.

## 2020-08-27 NOTE — Telephone Encounter (Signed)
Yes, any of the above. Just let me know which one and I can proceed with application process.

## 2020-08-27 NOTE — Telephone Encounter (Signed)
Thanks!  Let's start with Farxiga 5 mg once daily for diabetes. #90, 1 refill Do you contact the patient regarding this?

## 2020-08-27 NOTE — Telephone Encounter (Addendum)
Ria Comment, can you fill out the [place] assistance form and see if he would be willing to come in next Monday or Tuesday to sign?   [name], yes, we usually contact the patient and discuss requirements.  Thanks,  Debbora Dus, PharmD Clinical Pharmacist [facility] [phone]

## 2020-08-27 NOTE — Telephone Encounter (Signed)
Called patient let know we are working on patient assistance for him. We will contact him with further information.

## 2020-08-30 ENCOUNTER — Telehealth: Payer: Self-pay

## 2020-08-30 ENCOUNTER — Ambulatory Visit (INDEPENDENT_AMBULATORY_CARE_PROVIDER_SITE_OTHER): Payer: Medicare Other

## 2020-08-30 DIAGNOSIS — Z95810 Presence of automatic (implantable) cardiac defibrillator: Secondary | ICD-10-CM

## 2020-08-30 DIAGNOSIS — I5022 Chronic systolic (congestive) heart failure: Secondary | ICD-10-CM

## 2020-08-30 NOTE — Progress Notes (Addendum)
Uploaded forms from [facility] [employer] to fill out for patient assistance for Farxiga 73m. Forms completed and the patient notified to come by office and sign.  MDebbora Dus CPP notified  [place] Sensor CLeisure Village WestAssistant [phone]

## 2020-08-30 NOTE — Telephone Encounter (Signed)
Application has been placed in front desk folder for patient to come in and sign. Patient notified.  Debbora Dus, PharmD Clinical Pharmacist [facility] at [facility] [phone]

## 2020-08-31 ENCOUNTER — Other Ambulatory Visit: Payer: Self-pay | Admitting: Family Medicine

## 2020-08-31 DIAGNOSIS — M5416 Radiculopathy, lumbar region: Secondary | ICD-10-CM

## 2020-09-01 ENCOUNTER — Telehealth: Payer: Self-pay

## 2020-09-01 NOTE — Progress Notes (Signed)
EPIC Encounter for ICM Monitoring  Patient Name: [name] is a 75 y.o. male Date: 09/01/2020 Primary Care Physican: [name], NP Primary Cardiologist: Irish Lack Electrophysiologist: Allred 07/02/2020 Weight:  210 lbs                                                          Time in AF   <0.1 hr/day (<0.1%)   Attempted call to patient and unable to reach.  Transmission reviewed.    Opivol thoracic impedance normal but was suggesting possible fluid accumulation from 7/15-7/27 and 8/1-8/8.   No diuretic prescribed   Recommendations: Unable to reach.     Follow-up plan: ICM clinic phone appointment on 11/01/2020 (he requests ICM follow every 2 months at this time).  91 day device clinic remote transmission 10/06/2020.     EP/Cardiology Office Visits: Recall 01/03/2021 with Dr. Beau Fanny.  Recall 08/13/2021 with Oda Kilts, PA   Copy of ICM check sent to Dr. Rayann Heman.   3 month ICM trend: 08/30/2020.    1 Year ICM trend:       [name], RN 09/01/2020 12:05 PM

## 2020-09-01 NOTE — Telephone Encounter (Signed)
Remote ICM transmission received.  Attempted call to patient regarding ICM remote transmission and no answer or voice mail option.

## 2020-09-08 ENCOUNTER — Encounter: Payer: Self-pay | Admitting: Family Medicine

## 2020-09-08 ENCOUNTER — Other Ambulatory Visit: Payer: Self-pay

## 2020-09-08 ENCOUNTER — Ambulatory Visit (INDEPENDENT_AMBULATORY_CARE_PROVIDER_SITE_OTHER): Payer: Medicare Other | Admitting: Family Medicine

## 2020-09-08 VITALS — BP 130/64 | HR 88 | Temp 98.4°F | Ht 67.0 in | Wt 214.5 lb

## 2020-09-08 DIAGNOSIS — I255 Ischemic cardiomyopathy: Secondary | ICD-10-CM | POA: Diagnosis not present

## 2020-09-08 DIAGNOSIS — M5416 Radiculopathy, lumbar region: Secondary | ICD-10-CM | POA: Diagnosis not present

## 2020-09-08 MED ORDER — TIZANIDINE HCL 4 MG PO TABS: 2.0000 mg | ORAL_TABLET | Freq: Every evening | ORAL | 2 refills | Status: DC | PRN

## 2020-09-08 MED ORDER — PREDNISONE 20 MG PO TABS: ORAL_TABLET | ORAL | 0 refills | Status: DC

## 2020-09-08 NOTE — Progress Notes (Signed)
[name] T. [name] Virden, MD, [place] at [facility] [place], [address]  Phone: [phone]  FAX: [place] - 75 y.o. male  MRN [id number]  Date of Birth: [birth year]  Date: 09/08/2020  PCP: [name] Koch, NP  Referral: [name] Koch, NP  Chief Complaint  Patient presents with   Follow-up    Lumbar Radiculopathy-Seen by Dr. Lorelei [name] 08/09/20    This visit occurred during the SARS-CoV-2 public health emergency.  Safety protocols were in place, including screening questions prior to the visit, additional usage of staff PPE, and extensive cleaning of exam room while observing appropriate contact time as indicated for disinfecting solutions.   Subjective:   [name] is a 75 y.o. very pleasant male patient with Body mass index is 33.6 kg/m. who presents with the following:  I saw him 08/09/2020, with lumbar rad, and he is here for follow-up.  6 weeks out from onset of symptoms.  Pred x 14 with Zanaflex.  His symptoms did improve while he was taking some steroids, but they have recurred and he continues to have right sided back pain, right buttocks pain as well as radicular pain down the posterior aspect of his leg.  He does not describe any diabetes or tingling.  No weakness.  No changes in his continence.  R leg - buttocks to foot No numbness or weakness.   [facility] will hurt quite a bit.  Knee brace may have helped some.   Lying down feels better.  When he lies down, he generally has no pain.  Review of Systems is noted in the HPI, as appropriate   Objective:   BP 130/64   Pulse 88   Temp 98.4 F (36.9 C) (Temporal)   Ht 5' 7"  (1.702 m)   Wt 214 lb 8 oz (97.3 kg)   SpO2 99%   BMI 33.60 kg/m    Range of motion at  the waist: Flexion, extension, lateral bending and rotation: Without limitation  No echymosis or edema Rises to examination table with mild difficulty Gait:  minimally antalgic  Inspection/Deformity: N Paraspinus Tenderness: Notable tenderness from L2-S1 on the right and to a lesser extent from L2-S1 on the left.  Very taut.  B Ankle Dorsiflexion (L5,4): 5/5 B Great Toe Dorsiflexion (L5,4): 5/5 Heel Walk (L5): WNL Toe Walk (S1): WNL Rise/Squat (L4): WNL, mild pain  SENSORY B Medial Foot (L4): WNL B Dorsum (L5): WNL B Lateral (S1): WNL Light Touch: WNL Pinprick: WNL  B SLR, seated: neg B SLR, supine: neg B FABER: neg B Reverse FABER: neg B Greater Troch: NT B Log Roll: neg   Radiology:  Assessment and Plan:     ICD-10-CM   1. Right lumbar radiculopathy  M54.16 tiZANidine (ZANAFLEX) 4 MG tablet    DG Lumbar Spine Complete     Acute on chronic back pain with exacerbation.  Continued right-sided radicular pain without resolution.  At this point, assess lumbar spine film for degenerative assessment.  Zanaflex as needed at nighttime is entirely reasonable.  I am also going to send him a pulse of some steroids.  I suggested some formal physical therapy, but at this point he would like to continue more with a home rehab program, so I helped him with a [facility]enzie protocol.  Meds ordered this encounter  Medications   tiZANidine (ZANAFLEX) 4 MG tablet    Sig: Take 0.5-1 tablets (2-4 mg total) by  mouth at bedtime as needed for muscle spasms.    Dispense:  30 tablet    Refill:  2   predniSONE (DELTASONE) 20 MG tablet    Sig: 2 tabs po daily for 5 days, then 1 tab po daily for 5 days    Dispense:  15 tablet    Refill:  0   Medications Discontinued During This Encounter  Medication Reason   tiZANidine (ZANAFLEX) 4 MG tablet Reorder   Orders Placed This Encounter  Procedures   DG Lumbar Spine Complete    Follow-up: Return in about 7 weeks (around 10/27/2020).  Dragon [employer] One speech-to-text software was used for transcription in this dictation.  Possible transcriptional errors can occur using Editor, commissioning.    Signed,  [name]. [name] Meares, MD   Outpatient Encounter Medications as of 09/08/2020  Medication Sig   acetaminophen (TYLENOL) 325 MG tablet Take 1-2 tablets (325-650 mg total) by mouth every 4 (four) hours as needed for mild pain.   apixaban (ELIQUIS) 5 MG TABS tablet Take 1 tablet (5 mg total) by mouth 2 (two) times daily.   carvedilol (COREG) 6.25 MG tablet TAKE 1 TABLET BY MOUTH TWICE A DAY WITH MEAL   dapagliflozin propanediol (FARXIGA) 5 MG TABS tablet Take 1 tablet (5 mg total) by mouth daily before breakfast. For diabetes   ezetimibe (ZETIA) 10 MG tablet Take 1 tablet (10 mg total) by mouth daily.   glipiZIDE (GLUCOTROL XL) 10 MG 24 hr tablet Take 1 tablet (10 mg total) by mouth daily with breakfast. For diabetes.   insulin NPH Human (NOVOLIN N RELION) 100 UNIT/ML injection Inject 55 units into the skin every morning and 55 units into the skin in the evening.   Insulin Pen Needle 31G X 8 MM MISC Use as directed with Lantus.   insulin regular (NOVOLIN R) 100 units/mL injection Inject 0.25 mLs (25 Units total) into the skin 3 (three) times daily before meals.   isosorbide mononitrate (IMDUR) 30 MG 24 hr tablet Take 1 tablet (30 mg total) by mouth daily. Please make overdue appt with Dr. Irish Lack before anymore refills. Thank you 2nd attempt   lisinopril (ZESTRIL) 5 MG tablet Take 1 tablet (5 mg total) by mouth daily. Please keep upcoming appt for future refills.   nitroGLYCERIN (NITROSTAT) 0.4 MG SL tablet Place 1 tablet (0.4 mg total) under the tongue every 5 (five) minutes as needed for chest pain.   OVER THE COUNTER MEDICATION Place 1 drop into both eyes 2 (two) times daily. liquid msm eye drops/ for floaters in the eyes   predniSONE (DELTASONE) 20 MG tablet 2 tabs po daily for 5 days, then 1 tab po daily for 5 days   tamsulosin (FLOMAX) 0.4 MG CAPS capsule Take 1 capsule (0.4 mg total) by mouth daily. For urine flow   [DISCONTINUED] tiZANidine (ZANAFLEX) 4 MG tablet Take 0.5-1  tablets (2-4 mg total) by mouth at bedtime as needed for muscle spasms.   tiZANidine (ZANAFLEX) 4 MG tablet Take 0.5-1 tablets (2-4 mg total) by mouth at bedtime as needed for muscle spasms.   No facility-administered encounter medications on file as of 09/08/2020.

## 2020-09-11 ENCOUNTER — Other Ambulatory Visit: Payer: Self-pay | Admitting: Internal Medicine

## 2020-09-13 NOTE — Telephone Encounter (Signed)
Eliquis 5 mg refill request received. Patient is 76 years old, weight-97.3 kg, Crea- 1.2 on 11/26/19, Diagnosis-afib, and last seen by Dr. Rayann Heman on 08/18/20. Dose is appropriate based on dosing criteria. Will send in refill to requested pharmacy.

## 2020-09-29 ENCOUNTER — Telehealth: Payer: Self-pay

## 2020-09-29 NOTE — Progress Notes (Signed)
    Chronic Care Management Pharmacy Assistant   Name: [name]  MRN: [id number] DOB: December 18, 1945  Reason for Encounter: Patient Assistance Form Follow-up   Spoke with [place].  Approved for Farxiga 09/10/2020 - 01/01/2021  Debbora Dus, CPP notified  [name], [place] Clinical Pharmacy Assistant [phone]

## 2020-09-30 ENCOUNTER — Other Ambulatory Visit: Payer: Self-pay | Admitting: Interventional Cardiology

## 2020-10-06 ENCOUNTER — Ambulatory Visit (INDEPENDENT_AMBULATORY_CARE_PROVIDER_SITE_OTHER): Payer: Medicare Other

## 2020-10-06 DIAGNOSIS — I428 Other cardiomyopathies: Secondary | ICD-10-CM

## 2020-10-06 LAB — CUP PACEART REMOTE DEVICE CHECK
Battery Remaining Longevity: 126 mo
Battery Voltage: 3.02 V
Brady Statistic RV Percent Paced: 0.01 %
Date Time Interrogation Session: 20221005001804
HighPow Impedance: 71 Ohm
Implantable Lead Implant Date: 20210105
Implantable Lead Location: 753860
Implantable Pulse Generator Implant Date: 20210105
Lead Channel Impedance Value: 380 Ohm
Lead Channel Impedance Value: 437 Ohm
Lead Channel Pacing Threshold Amplitude: 0.5 V
Lead Channel Pacing Threshold Pulse Width: 0.4 ms
Lead Channel Sensing Intrinsic Amplitude: 10.5 mV
Lead Channel Sensing Intrinsic Amplitude: 10.5 mV
Lead Channel Setting Pacing Amplitude: 2.5 V
Lead Channel Setting Pacing Pulse Width: 0.4 ms
Lead Channel Setting Sensing Sensitivity: 0.3 mV

## 2020-10-11 ENCOUNTER — Telehealth: Payer: Self-pay | Admitting: Primary Care

## 2020-10-11 NOTE — Chronic Care Management (AMB) (Signed)
  Chronic Care Management   Note  10/11/2020 Name: [name] MRN: [id number] DOB: 06-Jan-1945  [name] is a 75 y.o. year old male who is a primary care patient of [name], NP. I reached out to [name] by phone today in response to a referral sent by [name]'s PCP, [name], NP.   [name] was given information about Chronic Care Management services today including:  CCM service includes personalized support from designated clinical staff supervised by his physician, including individualized plan of care and coordination with other care providers 24/7 contact phone numbers for assistance for urgent and routine care needs. Service will only be billed when office clinical staff spend 20 minutes or more in a month to coordinate care. Only one practitioner may furnish and bill the service in a calendar month. The patient may stop CCM services at any time (effective at the end of the month) by phone call to the office staff.   Patient agreed to services and verbal consent obtained.   Follow up plan:   [name]

## 2020-10-14 NOTE — Progress Notes (Signed)
Remote ICD transmission.   

## 2020-10-28 ENCOUNTER — Telehealth: Payer: Self-pay

## 2020-10-28 NOTE — Progress Notes (Signed)
    Chronic Care Management Pharmacy Assistant   Name: RANSOM NICKSON  MRN: [id number] DOB: [birth year]  Reason for Encounter:  CCM ([place])   10/28/2020 - Called patient to inform that [facility] will auto renew [employer] for 2023. [place] [employer] [place] will call patient for information. Left message with patient's wife with information.   Debbora Dus, CPP notified  [name], [place] Clinical Pharmacy Assistant [phone]   Time Spent: 5 Minutes

## 2020-11-01 ENCOUNTER — Ambulatory Visit (INDEPENDENT_AMBULATORY_CARE_PROVIDER_SITE_OTHER): Payer: Medicare Other

## 2020-11-01 DIAGNOSIS — I5022 Chronic systolic (congestive) heart failure: Secondary | ICD-10-CM | POA: Diagnosis not present

## 2020-11-01 DIAGNOSIS — Z95810 Presence of automatic (implantable) cardiac defibrillator: Secondary | ICD-10-CM

## 2020-11-02 NOTE — Progress Notes (Signed)
Device clinic message  Benedetto Goad, RN  Alianys Chacko Panda, RN We will just continue to monitor for now and see what next month trends are.

## 2020-11-02 NOTE — Progress Notes (Signed)
EPIC Encounter for ICM Monitoring  Patient Name: [name] is a 75 y.o. male Date: 11/02/2020 Primary Care Physican: [name], NP Primary Cardiologist: Irish Lack Electrophysiologist: Allred 11/02/2020 Weight:  210 lbs       Compared previous reports for AT/AF burden to show increase Aug-Oct Clinical Status (06-Oct-2020 to 01-Nov-2020) Time in AF  0.6 hr/day (2.5%)   Clinical Status (30-Aug-2020 to 06-Oct-2020)  Time in AF 2.0 hr/day (8.2%) Clinical Status (18-Aug-2020 to 30-Aug-2020) Time in AF <0.1 hr/day (<0.1%) Clinical Status (04-May-2020 to 02-Jul-2020)   Time in AF <0.1 hr/day (<0.1%)   Spoke with patient and heart failure questions reviewed.  Pt asymptomatic for fluid accumulation and feeling well.  He did not have fluid symptoms during decreased impedance.   Opivol thoracic impedance normal but was suggesting possible fluid accumulation from 10/1-10/22 which appears to correlate with increased AT/AF.  Message sent to device clinic 11/1 to review AT/AF Burden changes.   No diuretic prescribed   Recommendations: No changes and encouraged to call if experiencing any fluid symptoms.   Follow-up plan: ICM clinic phone appointment on 01/06/2021 (he requests ICM follow every 2 months at this time).  91 day device clinic remote transmission 01/05/2021.     EP/Cardiology Office Visits: 01/11/2021 with Dr. Beau Fanny.  Recall 08/13/2021 with Oda Kilts, PA   Copy of ICM check sent to Dr. Rayann Heman.    3 month ICM trend: 11/01/2020.    1 Year ICM trend:       [name], RN 11/02/2020 2:17 PM

## 2020-11-10 ENCOUNTER — Telehealth: Payer: Self-pay | Admitting: Pharmacist

## 2020-11-10 NOTE — Telephone Encounter (Signed)
Called pt to follow up with lipids (see phone note 07/07/20 for more detail). [facility] has reopened so would likely be able to start pt on Repatha with no copay. Ezetimibe was prescribed in July, follow up LDL was 122 on 08/18/20. Pt states he hadn't started taking ezetimibe at that time though and has only been on it for the past 6 weeks or so. He sees his PCP at the end of the month and will ask her to check his cholesterol. He seems agreeable to try Repatha if his follow up LDL remains elevated.

## 2020-11-24 ENCOUNTER — Other Ambulatory Visit: Payer: Self-pay | Admitting: Interventional Cardiology

## 2020-12-01 ENCOUNTER — Telehealth: Payer: Self-pay

## 2020-12-01 ENCOUNTER — Ambulatory Visit: Payer: Medicare Other | Admitting: Primary Care

## 2020-12-01 NOTE — Telephone Encounter (Signed)
Called patient to r/s apt but the line kept ringing

## 2020-12-01 NOTE — Progress Notes (Deleted)
poc

## 2020-12-01 NOTE — Telephone Encounter (Signed)
[facility] Night - Client Nonclinical Telephone Record  [facility] Client [facility] Night - Client Client Site [place] - Night Provider Alma Friendly - NP Contact Type Call Who Is Calling Patient / Member / Family / Caregiver Caller Name [place] Phone Number [phone] Patient Name [name] Patient DOB 07/12/1945 Call Type Message Only Information Provided Reason for Call Request to [facility] Appointment Initial Comment Caller states he is needing to cancel his 740 appointment. Patient request to speak to RN No Additional Comment Office hours provided. Disp. Time Disposition Final User 12/01/2020 7:11:39 AM General Information Provided Yes Uvaldo Rising Call Closed By: Uvaldo Rising Transaction Date/Time: 12/01/2020 7:09:21 AM (ET

## 2020-12-07 ENCOUNTER — Telehealth: Payer: Self-pay | Admitting: Primary Care

## 2020-12-07 ENCOUNTER — Encounter: Payer: Self-pay | Admitting: Primary Care

## 2020-12-07 ENCOUNTER — Telehealth: Payer: Self-pay

## 2020-12-07 ENCOUNTER — Other Ambulatory Visit: Payer: Self-pay

## 2020-12-07 ENCOUNTER — Ambulatory Visit (INDEPENDENT_AMBULATORY_CARE_PROVIDER_SITE_OTHER): Payer: Medicare Other | Admitting: Primary Care

## 2020-12-07 VITALS — BP 126/66 | HR 68 | Temp 98.6°F | Ht 67.0 in | Wt 212.0 lb

## 2020-12-07 DIAGNOSIS — E782 Mixed hyperlipidemia: Secondary | ICD-10-CM

## 2020-12-07 DIAGNOSIS — N182 Chronic kidney disease, stage 2 (mild): Secondary | ICD-10-CM

## 2020-12-07 DIAGNOSIS — E1122 Type 2 diabetes mellitus with diabetic chronic kidney disease: Secondary | ICD-10-CM

## 2020-12-07 DIAGNOSIS — I255 Ischemic cardiomyopathy: Secondary | ICD-10-CM

## 2020-12-07 DIAGNOSIS — Z794 Long term (current) use of insulin: Secondary | ICD-10-CM

## 2020-12-07 DIAGNOSIS — E1159 Type 2 diabetes mellitus with other circulatory complications: Secondary | ICD-10-CM

## 2020-12-07 LAB — COMPREHENSIVE METABOLIC PANEL
ALT: 114 U/L — ABNORMAL HIGH (ref 0–53)
AST: 48 U/L — ABNORMAL HIGH (ref 0–37)
Albumin: 4.1 g/dL (ref 3.5–5.2)
Alkaline Phosphatase: 114 U/L (ref 39–117)
BUN: 15 mg/dL (ref 6–23)
CO2: 27 mEq/L (ref 19–32)
Calcium: 9.3 mg/dL (ref 8.4–10.5)
Chloride: 103 mEq/L (ref 96–112)
Creatinine, Ser: 0.98 mg/dL (ref 0.40–1.50)
GFR: 75.44 mL/min (ref >60.00)
Glucose, Bld: 112 mg/dL — ABNORMAL HIGH (ref 70–99)
Potassium: 3.8 mEq/L (ref 3.5–5.1)
Sodium: 138 mEq/L (ref 135–145)
Total Bilirubin: 3.1 mg/dL — ABNORMAL HIGH (ref 0.2–1.2)
Total Protein: 7.3 g/dL (ref 6.0–8.3)

## 2020-12-07 LAB — LIPID PANEL
Cholesterol: 125 mg/dL (ref 0–200)
HDL: 38.5 mg/dL — ABNORMAL LOW (ref >39.00)
LDL Cholesterol: 75 mg/dL (ref 0–99)
NonHDL: 86.83
Total CHOL/HDL Ratio: 3
Triglycerides: 60 mg/dL (ref 0.0–149.0)
VLDL: 12 mg/dL (ref 0.0–40.0)

## 2020-12-07 LAB — POCT GLYCOSYLATED HEMOGLOBIN (HGB A1C): Hemoglobin A1C: 7.5 % — AB (ref 4.0–5.6)

## 2020-12-07 MED ORDER — DAPAGLIFLOZIN PROPANEDIOL 10 MG PO TABS: 10.0000 mg | ORAL_TABLET | Freq: Every day | ORAL | 3 refills | Status: DC

## 2020-12-07 NOTE — Assessment & Plan Note (Signed)
Repeat lipid panel pending. Intolerant to statin therapy.  Continue Zetia 10 mg. Follows with cardiology.

## 2020-12-07 NOTE — Patient Instructions (Addendum)
Stop by the lab prior to leaving today. I will notify you of your results once received.   We will increase your dose of Farxiga to 10 mg. I will be in touch with our pharmacist. Please call me if you don't hear from someone within 2 weeks.  Set up a physical for 3 months from now.  It was a pleasure to see you today!

## 2020-12-07 NOTE — Telephone Encounter (Signed)
Done

## 2020-12-07 NOTE — Progress Notes (Signed)
Subjective:    Patient ID: [name], male    DOB: [birth year], 75 y.o.   MRN: [id number]  HPI  [name] is a very pleasant 75 y.o. male with a history of atrial fibrillation, hypertension, CAD, cardiomyopathy, uncontrolled type 2 diabetes, BPH, hyperlipidemia who presents today for follow up of diabetes.  He is also needing a lipid panel completed per his cardiologist's request. Intolerant to statin therapy, managed on Zetia 10 mg.   Current medications include: Glipizide XL 10 mg, NPH insulin 55 units BID, Regular insulin 25 units TID with meals, Farxiga 5 mg. He's only injecting Regular insulin twice daily.   He is checking his blood glucose 3 times daily and is getting readings ranging 70-175, mostly 120's-150's.   Last A1C: 9.3 in August 2022, 7.5 today. Last Eye Exam: Due Last Foot Exam: UTD Pneumonia Vaccination: 2018 Urine Microalbumin: None, managed on ACE-I Statin: None. Zetia  Dietary changes since last visit: Poor diet.    Exercise: Playing golf, mostly riding, three days weekly.   BP Readings from Last 3 Encounters:  12/07/20 126/66  09/08/20 130/64  08/25/20 134/68       Review of Systems  Respiratory:  Negative for shortness of breath.   Cardiovascular:  Negative for chest pain.  Neurological:  Negative for numbness and headaches.        Past Medical History:  Diagnosis Date   Arthritis    "fingers, shoulders" (08/03/2015)   Atrial fibrillation (HCC)    Paroxysmal, rare episodes, sinus rhythm on flecainide, patient prefers not to take Coumadin   Bradycardia    April, 2013   Chest pain    Nuclear, 2006, no ischemia   Chronic lower back pain    "all my life"   Coronary artery disease    s/p staged cath 05/12/2014 and 5/11, DES x 2 to heavily calcified RCA, residual with 60% prox LAD, 70% D1   Ejection fraction    EF 55-60%,  echo, January, 2011   Elevated CPK    CPK elevated with normal MB and normal troponin the past   Heart murmur     History of echocardiogram    Echo 8/16: EF 55%, normal wall motion, grade 2 diastolic dysfunction, trivial MR, normal RV function, PASP 27 mmHg   Hypertension    IBS (irritable bowel syndrome)    Myocardial infarction ([place]) 06/2014   Sinus drainage    Type II diabetes mellitus ([facility])     Social History   Socioeconomic History   Marital status: Married    Spouse name: Not on file   Number of children: Not on file   Years of education: Not on file   Highest education level: Not on file  Occupational History   Not on file  Tobacco Use   Smoking status: Never   Smokeless tobacco: Never  Vaping Use   Vaping Use: Never used  Substance and Sexual Activity   Alcohol use: No   Drug use: No   Sexual activity: Not on file  Other Topics Concern   Not on file  Social History Narrative   Not on file   Social Determinants of Health   Financial Resource Strain: Not on file  Food Insecurity: Not on file  Transportation Needs: Not on file  Physical Activity: Not on file  Stress: Not on file  Social Connections: Not on file  Intimate Partner Violence: Not on file    Past Surgical History:  Procedure Laterality Date  APPENDECTOMY     CARDIAC CATHETERIZATION N/A 05/12/2014   Procedure: Left Heart Cath and Coronary Angiography;  Surgeon: Troy Sine, MD;  Location: [facility];  Service: Cardiovascular;  Laterality: N/A;   CARDIAC CATHETERIZATION N/A 05/13/2014   Procedure: Coronary/Graft Atherectomy;  Surgeon: Troy Sine, MD;  Location: [facility];  Service: Cardiovascular;  Laterality: N/A;   CARDIAC CATHETERIZATION Right 05/13/2014   Procedure: Temporary Pacemaker;  Surgeon: Troy Sine, MD;  Location: [facility];  Service: Cardiovascular;  Laterality: Right;   CARDIAC CATHETERIZATION N/A 05/13/2014   Procedure: Coronary Stent Intervention;  Surgeon: Troy Sine, MD;  Location: [facility];  Service: Cardiovascular;  Laterality: N/A;    CARDIAC CATHETERIZATION N/A 06/18/2014   Procedure: Left Heart Cath and Coronary Angiography;  Surgeon: Peter M Martinique, MD;  Location: [facility];  Service: Cardiovascular;  Laterality: N/A;   CARDIAC CATHETERIZATION N/A 08/03/2015   Procedure: Left Heart Cath and Cors/Grafts Angiography;  Surgeon: Nelva Bush, MD;  Location: [facility];  Service: Cardiovascular;  Laterality: N/A;   CARDIOVASCULAR STRESS TEST  05/06/2014   CATARACT EXTRACTION W/ INTRAOCULAR LENS IMPLANT Left    COLONOSCOPY WITH PROPOFOL N/A 02/13/2020   Procedure: COLONOSCOPY WITH PROPOFOL;  Surgeon: Carol Ada, MD;  Location: [facility];  Service: Endoscopy;  Laterality: N/A;   CORONARY ARTERY BYPASS GRAFT N/A 06/22/2014   Procedure: CORONARY ARTERY BYPASS GRAFTING (CABG) x five, using left internal mammary artery, and right leg greater saphenous vein harvested endoscopically;  Surgeon: Melrose Nakayama, MD;  Location: [facility];  Service: Open Heart Surgery;  Laterality: N/A;   ICD IMPLANT N/A 01/07/2019   Procedure: ICD IMPLANT;  Surgeon: Thompson Grayer, MD;  Location: [facility];  Service: Cardiovascular;  Laterality: N/A;   LAPAROSCOPIC CHOLECYSTECTOMY     LEFT HEART CATH AND CORS/GRAFTS ANGIOGRAPHY N/A 01/06/2019   Procedure: LEFT HEART CATH AND CORS/GRAFTS ANGIOGRAPHY;  Surgeon: Nelva Bush, MD;  Location: [facility];  Service: Cardiovascular;  Laterality: N/A;   MAZE N/A 06/22/2014   Procedure: MAZE;  Surgeon: Melrose Nakayama, MD;  Location: [facility];  Service: Open Heart Surgery;  Laterality: N/A;   POLYPECTOMY  02/13/2020   Procedure: POLYPECTOMY;  Surgeon: Carol Ada, MD;  Location: [facility];  Service: Endoscopy;;   TEE WITHOUT CARDIOVERSION N/A 06/22/2014   Procedure: TRANSESOPHAGEAL ECHOCARDIOGRAM (TEE);  Surgeon: Melrose Nakayama, MD;  Location: [facility];  Service: Open Heart Surgery;  Laterality: N/A;    Family History  Problem Relation Age of Onset   Alzheimer's  disease Mother    Emphysema Father    Cancer Brother    Arrhythmia Sister    Heart attack Sister    Heart disease Sister    Hyperlipidemia Sister    Hypertension Sister     Allergies  Allergen Reactions   Lipitor [Atorvastatin] Other (See Comments)    Myopathy Spring 2006   Pravastatin Other (See Comments)    LEG CRAMPS   Simvastatin Other (See Comments)    LEG CRAMPS    Current Outpatient Medications on File Prior to Visit  Medication Sig Dispense Refill   acetaminophen (TYLENOL) 325 MG tablet Take 1-2 tablets (325-650 mg total) by mouth every 4 (four) hours as needed for mild pain.     carvedilol (COREG) 6.25 MG tablet TAKE 1 TABLET BY MOUTH TWICE A DAY WITH MEAL 180 tablet 2   dapagliflozin propanediol (FARXIGA) 5 MG TABS  tablet Take 1 tablet (5 mg total) by mouth daily before breakfast. For diabetes 90 tablet 1   ELIQUIS 5 MG TABS tablet TAKE 1 TABLET BY MOUTH TWICE A DAY 60 tablet 5   ezetimibe (ZETIA) 10 MG tablet Take 1 tablet (10 mg total) by mouth daily. 30 tablet 11   glipiZIDE (GLUCOTROL XL) 10 MG 24 hr tablet Take 1 tablet (10 mg total) by mouth daily with breakfast. For diabetes. 90 tablet 3   insulin NPH Human (NOVOLIN N RELION) 100 UNIT/ML injection Inject 55 units into the skin every morning and 55 units into the skin in the evening. 30 mL 5   Insulin Pen Needle 31G X 8 MM MISC Use as directed with Lantus. 100 each 5   insulin regular (NOVOLIN R) 100 units/mL injection Inject 0.25 mLs (25 Units total) into the skin 3 (three) times daily before meals. (Patient taking differently: Inject 25 Units into the skin 3 (three) times daily before meals. Only doing twice a day) 60 mL 2   isosorbide mononitrate (IMDUR) 30 MG 24 hr tablet Take 1 tablet (30 mg total) by mouth daily. 90 tablet 2   lisinopril (ZESTRIL) 5 MG tablet TAKE 1 TABLET (5 MG TOTAL) BY MOUTH DAILY. PLEASE KEEP UPCOMING APPT FOR FUTURE REFILLS. 90 tablet 3   nitroGLYCERIN (NITROSTAT) 0.4 MG SL tablet Place 1  tablet (0.4 mg total) under the tongue every 5 (five) minutes as needed for chest pain. 25 tablet 6   OVER THE COUNTER MEDICATION Place 1 drop into both eyes 2 (two) times daily. liquid msm eye drops/ for floaters in the eyes     tamsulosin (FLOMAX) 0.4 MG CAPS capsule Take 1 capsule (0.4 mg total) by mouth daily. For urine flow 90 capsule 1   tiZANidine (ZANAFLEX) 4 MG tablet Take 0.5-1 tablets (2-4 mg total) by mouth at bedtime as needed for muscle spasms. 30 tablet 2   No current facility-administered medications on file prior to visit.    BP 126/66   Pulse 68   Temp 98.6 F (37 C) (Temporal)   Ht 5' 7"  (1.702 m)   Wt 212 lb (96.2 kg)   SpO2 98%   BMI 33.20 kg/m  Objective:   Physical Exam Cardiovascular:     Rate and Rhythm: Normal rate and regular rhythm.  Pulmonary:     Effort: Pulmonary effort is normal.     Breath sounds: Normal breath sounds. No wheezing or rales.  Musculoskeletal:     Cervical back: Neck supple.  Skin:    General: Skin is warm and dry.  Neurological:     Mental Status: He is alert and oriented to person, place, and time.          Assessment & Plan:      This visit occurred during the SARS-CoV-2 public health emergency.  Safety protocols were in place, including screening questions prior to the visit, additional usage of staff PPE, and extensive cleaning of exam room while observing appropriate contact time as indicated for disinfecting solutions.

## 2020-12-07 NOTE — Progress Notes (Signed)
Chronic Care Management Pharmacy Assistant   Name: [name]  MRN: [id number] DOB: 1946/01/02  Reason for Encounter: CCM (Initial Questions)   Recent office visits:  12/07/2020 - Alma Friendly, NP - Patient presented for Type 2 Diabetes. Labs: A1c, CMP and Lipid. Stop due to completed course: predniSONE (DELTASONE) 20 MG tablet. CHANGE: Increase Farxiga to 10 mg vs. 5 mg.  08/25/2020 - Alma Friendly, NP - Patient presented for follow up for Type 2 Diabetes. Labs: A1c. Start: dapagliflozin propanediol (FARXIGA) 5 MG TABS tablet. Stop due to completed course: predniSONE (DELTASONE) 20 MG tablet.  Recent consult visits:  09/08/2020 - Family Medicine - Patient presented for Right lumbar radiculopathy. Start: predniSONE (DELTASONE) 20 MG tablet. Labs: DG Lumbar Spine.  08/18/2020 - Cardiology - Patient presented for routine electrophysiology followup. Labs: EKG and in-clinic cup Paceart device check. No medication changes.  08/09/2020 - Family Medicine - Patient presented for back pain. Start: prednisone (DELTASONE) 20 MG tablet for 7 days. Start: tiZANidine (ZANAFLEX) 4 MG tablet PRN for muscle spasms.  0706/2022 - Cardiology - Patient presented for follow-up coronary disease. Lab: EKG. Start: ezetimibe (ZETIA) 10 MG tablet.  Hospital visits:  None in previous 6 months  Medications: Outpatient Encounter Medications as of 12/07/2020  Medication Sig   acetaminophen (TYLENOL) 325 MG tablet Take 1-2 tablets (325-650 mg total) by mouth every 4 (four) hours as needed for mild pain.   carvedilol (COREG) 6.25 MG tablet TAKE 1 TABLET BY MOUTH TWICE A DAY WITH MEAL   dapagliflozin propanediol (FARXIGA) 5 MG TABS tablet Take 1 tablet (5 mg total) by mouth daily before breakfast. For diabetes   ELIQUIS 5 MG TABS tablet TAKE 1 TABLET BY MOUTH TWICE A DAY   ezetimibe (ZETIA) 10 MG tablet Take 1 tablet (10 mg total) by mouth daily.   glipiZIDE (GLUCOTROL XL) 10 MG 24 hr tablet Take 1 tablet (10  mg total) by mouth daily with breakfast. For diabetes.   insulin NPH Human (NOVOLIN N RELION) 100 UNIT/ML injection Inject 55 units into the skin every morning and 55 units into the skin in the evening.   Insulin Pen Needle 31G X 8 MM MISC Use as directed with Lantus.   insulin regular (NOVOLIN R) 100 units/mL injection Inject 0.25 mLs (25 Units total) into the skin 3 (three) times daily before meals. (Patient taking differently: Inject 25 Units into the skin 3 (three) times daily before meals. Only doing twice a day)   isosorbide mononitrate (IMDUR) 30 MG 24 hr tablet Take 1 tablet (30 mg total) by mouth daily.   lisinopril (ZESTRIL) 5 MG tablet TAKE 1 TABLET (5 MG TOTAL) BY MOUTH DAILY. PLEASE KEEP UPCOMING APPT FOR FUTURE REFILLS.   nitroGLYCERIN (NITROSTAT) 0.4 MG SL tablet Place 1 tablet (0.4 mg total) under the tongue every 5 (five) minutes as needed for chest pain.   OVER THE COUNTER MEDICATION Place 1 drop into both eyes 2 (two) times daily. liquid msm eye drops/ for floaters in the eyes   tamsulosin (FLOMAX) 0.4 MG CAPS capsule Take 1 capsule (0.4 mg total) by mouth daily. For urine flow   tiZANidine (ZANAFLEX) 4 MG tablet Take 0.5-1 tablets (2-4 mg total) by mouth at bedtime as needed for muscle spasms.   No facility-administered encounter medications on file as of 12/07/2020.   Lab Results  Component Value Date/Time   HGBA1C 7.5 (A) 12/07/2020 08:25 AM   HGBA1C 9.3 (A) 08/25/2020 08:24 AM   HGBA1C 9.2 (H)  01/06/2019 01:51 AM   HGBA1C 9.5 (H) 09/13/2018 09:04 AM   MICROALBUR 1.6 11/04/2015 08:26 AM   MICROALBUR 0.2 12/11/2014 01:22 PM    BP Readings from Last 3 Encounters:  12/07/20 126/66  09/08/20 130/64  08/25/20 134/68   Patient contacted to review initial questions prior to visit with Debbora Dus.  Have you seen any other providers since your last visit with PCP? No  Any changes in your medications or health? Yes Increase Farxiga to 35m vs. 511m   Any side effects  from any medications? No  Do you have an symptoms or problems not managed by your medications? No  Any concerns about your health right now? Yes - Patient states him and his wife have the stomach bug.   Has your provider asked that you check blood pressure, blood sugar, or follow special diet at home? Yes - Patient states he takes his blood sugar at home. I have asked patient to take and record his blood sugar starting today daily until his phone call with MiDebbora DusPatient agreed.   Do you get any type of exercise on a regular basis? No  Can you think of a goal you would like to reach for your health? Yes - Patient states he would like to be able to play golf without being worried if his stomach will be messed up and get sick.   Do you have any problems getting your medications? No  Is there anything that you would like to discuss during the appointment? No  Spoke with patient and reminded them to have all medications, supplements and any blood glucose and blood pressure readings available for review with pharmacist, at their telephone visit on 12/13/2020 at 11:00.    Star Rating Drugs:  Medication:  Last Fill: Day Supply Lisinopril 5 mg  09/30/2020 90   Novolin R 100 units Patient reports getting through [facility]  Glipizide 10 mg 11/21/2020 90 Novolin N 100 units [facility] [place] [facility] [place] [facility]   Care Gaps: Annual wellness visit in last year? No Most Recent BP reading: 126/66 on 12/07/2020  If Diabetic: Most recent A1C reading: 7.5 on 12/07/2020  Last eye exam / retinopathy screening: 11/26/2019 Last diabetic foot exam: Up to date  MiDebbora DusCPP notified  AmMarijean [phone] Spent: 4545inutes

## 2020-12-07 NOTE — Telephone Encounter (Signed)
Sharyn Lull,  I saw this patient today for his diabetes follow up and would like to increase his Farxiga to 10 mg.  He is on a patient assistance program you initiated. How do we increase his dose from 5 mg to 10 mg?   [name], NP-C

## 2020-12-07 NOTE — Telephone Encounter (Signed)
[place] [address]

## 2020-12-07 NOTE — Assessment & Plan Note (Signed)
Improved with A1C of 7.5 today! Would like to see him below 7 given his significant cardiac history, he agrees.  Increase Farxiga to 10 mg.  Continue NPH at 55 units BID. Continue Regular insulin at 25 units BID with meals. Continue Glipizide XL 10 mg.  We will see him back in 3 months. He will schedule eye exam.

## 2020-12-08 NOTE — Progress Notes (Deleted)
Subjective:   [name] is a 75 y.o. male who presents for Medicare Annual/Subsequent preventive examination.  I connected with Orion Modest today by telephone and verified that I am speaking with the correct person using two identifiers. Location patient: home Location provider: work Persons participating in the virtual visit: patient, Marine scientist.    I discussed the limitations, risks, security and privacy concerns of performing an evaluation and management service by telephone and the availability of in person appointments. I also discussed with the patient that there may be a patient responsible charge related to this service. The patient expressed understanding and verbally consented to this telephonic visit.    Interactive audio and video telecommunications were attempted between this provider and patient, however failed, due to patient having technical difficulties OR patient did not have access to video capability.  We continued and completed visit with audio only.  Some vital signs may be absent or patient reported.   Time Spent with patient on telephone encounter: *** minutes  Review of Systems           Objective:    There were no vitals filed for this visit. There is no height or weight on file to calculate BMI.  Advanced Directives 02/13/2020 01/06/2019 09/13/2018 09/05/2017 08/24/2016 08/03/2015 06/18/2014  Does Patient Have a Medical Advance Directive? Yes Yes Yes Yes Yes Yes Yes  Type of Advance Directive Living will [place];Living will [place];Living will [place];Living will [place];Living will Living will Living will;Healthcare Power of Attorney  Does patient want to make changes to medical advance directive? - No - Patient declined - - - No - Patient declined No - Patient declined  Copy of Healthcare Power of Attorney in Chart? - No - copy requested No - copy requested No - copy requested No  - copy requested No - copy requested No - copy requested    Current Medications (verified) Outpatient Encounter Medications as of 12/13/2020  Medication Sig   acetaminophen (TYLENOL) 325 MG tablet Take 1-2 tablets (325-650 mg total) by mouth every 4 (four) hours as needed for mild pain.   carvedilol (COREG) 6.25 MG tablet TAKE 1 TABLET BY MOUTH TWICE A DAY WITH MEAL   dapagliflozin propanediol (FARXIGA) 10 MG TABS tablet Take 1 tablet (10 mg total) by mouth daily before breakfast. For diabetes.   ELIQUIS 5 MG TABS tablet TAKE 1 TABLET BY MOUTH TWICE A DAY   ezetimibe (ZETIA) 10 MG tablet Take 1 tablet (10 mg total) by mouth daily.   glipiZIDE (GLUCOTROL XL) 10 MG 24 hr tablet Take 1 tablet (10 mg total) by mouth daily with breakfast. For diabetes.   insulin NPH Human (NOVOLIN N RELION) 100 UNIT/ML injection Inject 55 units into the skin every morning and 55 units into the skin in the evening.   Insulin Pen Needle 31G X 8 MM MISC Use as directed with Lantus.   insulin regular (NOVOLIN R) 100 units/mL injection Inject 0.25 mLs (25 Units total) into the skin 3 (three) times daily before meals. (Patient taking differently: Inject 25 Units into the skin 3 (three) times daily before meals. Only doing twice a day)   isosorbide mononitrate (IMDUR) 30 MG 24 hr tablet Take 1 tablet (30 mg total) by mouth daily.   lisinopril (ZESTRIL) 5 MG tablet TAKE 1 TABLET (5 MG TOTAL) BY MOUTH DAILY. PLEASE KEEP UPCOMING APPT FOR FUTURE REFILLS.   nitroGLYCERIN (NITROSTAT) 0.4 MG SL  tablet Place 1 tablet (0.4 mg total) under the tongue every 5 (five) minutes as needed for chest pain.   OVER THE COUNTER MEDICATION Place 1 drop into both eyes 2 (two) times daily. liquid msm eye drops/ for floaters in the eyes   tamsulosin (FLOMAX) 0.4 MG CAPS capsule Take 1 capsule (0.4 mg total) by mouth daily. For urine flow   tiZANidine (ZANAFLEX) 4 MG tablet Take 0.5-1 tablets (2-4 mg total) by mouth at bedtime as needed for muscle  spasms.   No facility-administered encounter medications on file as of 12/13/2020.    Allergies (verified) Lipitor [atorvastatin], Pravastatin, and Simvastatin   History: Past Medical History:  Diagnosis Date   Arthritis    "fingers, shoulders" (08/03/2015)   Atrial fibrillation (HCC)    Paroxysmal, rare episodes, sinus rhythm on flecainide, patient prefers not to take Coumadin   Bradycardia    April, 2013   Chest pain    Nuclear, 2006, no ischemia   Chronic lower back pain    "all my life"   Coronary artery disease    s/p staged cath 05/12/2014 and 5/11, DES x 2 to heavily calcified RCA, residual with 60% prox LAD, 70% D1   Ejection fraction    EF 55-60%,  echo, January, 2011   Elevated CPK    CPK elevated with normal MB and normal troponin the past   Heart murmur    History of echocardiogram    Echo 8/16: EF 55%, normal wall motion, grade 2 diastolic dysfunction, trivial MR, normal RV function, PASP 27 mmHg   Hypertension    IBS (irritable bowel syndrome)    Myocardial infarction ([place]) 06/2014   Sinus drainage    Type II diabetes mellitus ([place])    Past Surgical History:  Procedure Laterality Date   APPENDECTOMY     CARDIAC CATHETERIZATION N/A 05/12/2014   Procedure: Left Heart Cath and Coronary Angiography;  Surgeon: Troy Sine, MD;  Location: [place] [facility];  Service: Cardiovascular;  Laterality: N/A;   CARDIAC CATHETERIZATION N/A 05/13/2014   Procedure: Coronary/Graft Atherectomy;  Surgeon: Troy Sine, MD;  Location: [place] [facility];  Service: Cardiovascular;  Laterality: N/A;   CARDIAC CATHETERIZATION Right 05/13/2014   Procedure: Temporary Pacemaker;  Surgeon: Troy Sine, MD;  Location: [facility];  Service: Cardiovascular;  Laterality: Right;   CARDIAC CATHETERIZATION N/A 05/13/2014   Procedure: Coronary Stent Intervention;  Surgeon: Troy Sine, MD;  Location: [place] [facility];  Service: Cardiovascular;  Laterality: N/A;   CARDIAC  CATHETERIZATION N/A 06/18/2014   Procedure: Left Heart Cath and Coronary Angiography;  Surgeon: Peter M Martinique, MD;  Location: [place];  Service: Cardiovascular;  Laterality: N/A;   CARDIAC CATHETERIZATION N/A 08/03/2015   Procedure: Left Heart Cath and Cors/Grafts Angiography;  Surgeon: Nelva Bush, MD;  Location: [facility];  Service: Cardiovascular;  Laterality: N/A;   CARDIOVASCULAR STRESS TEST  05/06/2014   CATARACT EXTRACTION W/ INTRAOCULAR LENS IMPLANT Left    COLONOSCOPY WITH PROPOFOL N/A 02/13/2020   Procedure: COLONOSCOPY WITH PROPOFOL;  Surgeon: Carol Ada, MD;  Location: [facility];  Service: Endoscopy;  Laterality: N/A;   CORONARY ARTERY BYPASS GRAFT N/A 06/22/2014   Procedure: CORONARY ARTERY BYPASS GRAFTING (CABG) x five, using left internal mammary artery, and right leg greater saphenous vein harvested endoscopically;  Surgeon: Melrose Nakayama, MD;  Location: [facility];  Service: Open Heart Surgery;  Laterality: N/A;   ICD IMPLANT N/A 01/07/2019  Procedure: ICD IMPLANT;  Surgeon: Thompson Grayer, MD;  Location: [facility];  Service: Cardiovascular;  Laterality: N/A;   LAPAROSCOPIC CHOLECYSTECTOMY     LEFT HEART CATH AND CORS/GRAFTS ANGIOGRAPHY N/A 01/06/2019   Procedure: LEFT HEART CATH AND CORS/GRAFTS ANGIOGRAPHY;  Surgeon: Nelva Bush, MD;  Location: [facility];  Service: Cardiovascular;  Laterality: N/A;   MAZE N/A 06/22/2014   Procedure: MAZE;  Surgeon: Melrose Nakayama, MD;  Location: [facility];  Service: Open Heart Surgery;  Laterality: N/A;   POLYPECTOMY  02/13/2020   Procedure: POLYPECTOMY;  Surgeon: Carol Ada, MD;  [facility];  Service: Endoscopy;;   TEE WITHOUT CARDIOVERSION N/A 06/22/2014   Procedure: TRANSESOPHAGEAL ECHOCARDIOGRAM (TEE);  Surgeon: Melrose Nakayama, MD;  Location: [facility];  Service: Open Heart Surgery;  Laterality: N/A;   Family History  Problem Relation Age of Onset   Alzheimer's disease Mother     Emphysema Father    Cancer Brother    Arrhythmia Sister    Heart attack Sister    Heart disease Sister    Hyperlipidemia Sister    Hypertension Sister    Social History   Socioeconomic History   Marital status: Married    Spouse name: Not on file   Number of children: Not on file   Years of education: Not on file   Highest education level: Not on file  Occupational History   Not on file  Tobacco Use   Smoking status: Never   Smokeless tobacco: Never  Vaping Use   Vaping Use: Never used  Substance and Sexual Activity   Alcohol use: No   Drug use: No   Sexual activity: Not on file  Other Topics Concern   Not on file  Social History Narrative   Not on file   Social Determinants of Health   Financial Resource Strain: Not on file  Food Insecurity: Not on file  Transportation Needs: Not on file  Physical Activity: Not on file  Stress: Not on file  Social Connections: Not on file    Tobacco Counseling Counseling given: Not Answered   Clinical Intake:                 Diabetes:  Is the patient diabetic?  Yes  If diabetic, was a CBG obtained today?  No , visit completed over the phone. Did the patient bring in their glucometer from home?  No , visit completed over the phone. How often do you monitor your CBG's? ***.   Financial Strains and Diabetes Management:  Are you having any financial strains with the device, your supplies or your medication? {YES/NO:21197}.  Does the patient want to be seen by Chronic Care Management for management of their diabetes?  {YES/NO:21197} Would the patient like to be referred to a Nutritionist or for Diabetic Management?  {YES/NO:21197}  Diabetic Exams:  Diabetic Eye Exam: Completed ***. Overdue for diabetic eye exam. Pt has been advised about the importance in completing this exam. A referral has been placed today. Message sent to referral coordinator for scheduling purposes. Advised pt to expect a call from our  office re: appt.  Diabetic Foot Exam: Completed 08/25/20.         Activities of Daily Living In your present state of health, do you have any difficulty performing the following activities: 08/25/2020 02/26/2020  Hearing? N N  Vision? N N  Difficulty concentrating or making decisions? N N  Walking or climbing stairs? N N  Dressing  or bathing? N N  Doing errands, shopping? N N  Some recent data might be hidden    Patient Care Team: [name], NP as PCP - General (Internal Medicine) Jettie Booze, MD as PCP - Cardiology (Cardiology) Debbora Dus, [facility] as Pharmacist (Pharmacist) Marica Otter, OD (Optometry)  Indicate any recent Medical Services you may have received from other than [facility] providers in the past year (date may be approximate).     Assessment:   This is a routine wellness examination for [name].  Hearing/Vision screen No results found.  Dietary issues and exercise activities discussed:     Goals Addressed   None    Depression Screen PHQ 2/9 Scores 08/25/2020 11/26/2019 09/13/2018 09/05/2017 08/24/2016 03/10/2015 12/11/2014  PHQ - 2 Score 0 0 0 0 2 0 0  PHQ- 9 Score 0 0 0 0 2 - -    Fall Risk Fall Risk  08/25/2020 11/26/2019 09/13/2018 09/05/2017 08/24/2016  Falls in the past year? 0 0 0 No No  Comment - - - - -  Number falls in past yr: 0 0 - - -  Injury with Fall? 0 0 - - -  Risk for fall due to : - - Medication side effect - -  Follow up - - Falls evaluation completed;Falls prevention discussed - -    FALL RISK PREVENTION PERTAINING TO THE HOME:  Any stairs in or around the home? {YES/NO:21197} If so, are there any without handrails? {YES/NO:21197} Home free of loose throw rugs in walkways, pet beds, electrical cords, etc? {YES/NO:21197} Adequate lighting in your home to reduce risk of falls? {YES/NO:21197}  ASSISTIVE DEVICES UTILIZED TO PREVENT FALLS:  Life alert? {YES/NO:21197} Use of a cane, walker or w/c? {YES/NO:21197} Grab bars  in the bathroom? {YES/NO:21197} Shower chair or bench in shower? {YES/NO:21197} Elevated toilet seat or a handicapped toilet? {YES/NO:21197}  TIMED UP AND GO:  Was the test performed? No , visit completed over the phone.   Cognitive Function: MMSE - Mini Mental State Exam 09/13/2018 09/05/2017 08/24/2016  Orientation to time 5 5 5   Orientation to Place 5 5 5   Registration 3 3 3   Attention/ Calculation 5 0 0  Recall 3 3 2   Recall-comments - - pt was unable to recall 1 of 3 words  Language- name 2 objects 0 0 0  Language- repeat 1 1 1   Language- follow 3 step command 0 3 3  Language- read & follow direction 0 0 0  Write a sentence 0 0 0  Copy design 0 0 0  Total score 22 20 19         Immunizations Immunization History  Administered Date(s) Administered   Fluad Quad(high Dose 65+) 10/28/2019   Influenza Split 11/02/2013   Influenza,inj,Quad PF,6+ Mos 11/08/2015, 10/24/2016, 09/07/2017, 09/17/2018   Influenza-Unspecified 11/02/2012   PFIZER(Purple Top)SARS-COV-2 Vaccination 02/28/2019, 03/26/2019, 10/28/2019   Pneumococcal Conjugate-13 12/11/2014   Pneumococcal Polysaccharide-23 05/25/2005, 08/24/2016   Tdap 01/16/2011   Zoster Recombinat (Shingrix) 09/21/2018, 12/18/2018   Zoster, Live 03/02/2013    TDAP status: Up to date  {Flu Vaccine status:2101806}  Pneumococcal vaccine status: Up to date  {Covid-19 vaccine status:2101808}  Qualifies for Shingles Vaccine? Yes   Zostavax completed Yes   Shingrix Completed?: Yes  Screening Tests Health Maintenance  Topic Date Due   COVID-19 Vaccine (4 - Booster for Pfizer series) 12/23/2019   INFLUENZA VACCINE  08/02/2020   OPHTHALMOLOGY EXAM  11/25/2020   TETANUS/TDAP  01/15/2021   HEMOGLOBIN A1C  06/07/2021  FOOT EXAM  08/25/2021   Pneumonia Vaccine 54+ Years old  Completed   Hepatitis C Screening  Completed   Zoster Vaccines- Shingrix  Completed   HPV VACCINES  Aged Out   COLONOSCOPY (Pts 45-30yr Insurance coverage  will need to be confirmed)  [place] Due  Topic Date Due   COVID-19 Vaccine (4 - Booster for [place]) 12/23/2019   INFLUENZA VACCINE  08/02/2020   OPHTHALMOLOGY EXAM  11/25/2020    Colorectal cancer screening: Type of screening: Colonoscopy. Completed 02/13/20. Repeat every 5 years  Lung Cancer Screening: (Low Dose CT Chest recommended if Age 657-80years, 30 pack-year currently smoking OR have quit w/in 15years.) does not qualify.     Additional Screening:  Hepatitis C Screening: does qualify; Completed 03/10/15  Vision Screening: Recommended annual ophthalmology exams for early detection of glaucoma and other disorders of the eye. Is the patient up to date with their annual eye exam?  {YES/NO:21197} Who is the provider or what is the name of the office in which the patient attends annual eye exams? *** If pt is not established with a provider, would they like to be referred to a provider to establish care? {YES/NO:21197}.   Dental Screening: Recommended annual dental exams for proper oral hygiene  Community Resource Referral / Chronic Care Management: CRR required this visit?  {YES/NO:21197}  CCM required this visit?  {YES/NO:21197}     Plan:     I have personally reviewed and noted the following in the patient's chart:   Medical and social history Use of alcohol, tobacco or illicit drugs  Current medications and supplements including opioid prescriptions. {Opioid Prescriptions:[phone]} Functional ability and status Nutritional status Physical activity Advanced directives List of other physicians Hospitalizations, surgeries, and ER visits in previous 12 months Vitals Screenings to include cognitive, depression, and falls Referrals and appointments  In addition, I have reviewed and discussed with patient certain preventive protocols, quality metrics, and best practice recommendations. A written personalized care  plan for preventive services as well as general preventive health recommendations were provided to patient.   Due to this being a telephonic visit, the after visit summary with patients personalized plan was offered to patient via mail or my-chart. ***Patient declined at this time./ Patient would like to access on my-chart/ per request, patient was mailed a copy of AVS./ Patient preferred to pick up at office at next visit.   TLoma Messing LPN   [id number]  Nurse Health Advisor  Nurse Notes: none

## 2020-12-09 ENCOUNTER — Telehealth: Payer: Self-pay

## 2020-12-09 MED ORDER — DAPAGLIFLOZIN PROPANEDIOL 10 MG PO TABS: 10.0000 mg | ORAL_TABLET | Freq: Every day | ORAL | 3 refills | Status: DC

## 2020-12-09 NOTE — Progress Notes (Signed)
Opened in error

## 2020-12-09 NOTE — Progress Notes (Addendum)
    Chronic Care Management Pharmacy Assistant   Name: [name]  MRN: [id number] DOB: 03/28/1945  Reason for Encounter: CCM [name] Patient Assistance - Dose Change)   Called [facility] to verify that they received the updated prescription for Farxiga 10 mg. Representative stated they did not get the e-prescription. She states [employer] gets bogged down during holidays so fax would be faster. Please resend by fax to [phone]. Please add full name, date of birth and reference number: [id number] to the cover sheet. Spoke with [name] at [place], CPP notified  [name], [name] [phone]  Time Spent:  10 Minutes

## 2020-12-09 NOTE — Telephone Encounter (Signed)
My assistant called to verify [facility] received the new dose Farxiga 10 mg. The representative stated [employer] gets bogged down during holidays so fax would be preferred. Please resend the prescription by fax to [phone]. Include full name, date of birth and reference number: [id number] to the cover sheet.    Debbora Dus, PharmD Clinical Pharmacist Practitioner [facility] [phone]

## 2020-12-09 NOTE — Telephone Encounter (Signed)
Rx placed in [name]'s inbox for faxing.

## 2020-12-09 NOTE — Addendum Note (Signed)
Addended by: [name] on: 12/09/2020 12:31 PM   Modules accepted: Orders

## 2020-12-09 NOTE — Telephone Encounter (Signed)
Order faxed. No further action needed.

## 2020-12-13 ENCOUNTER — Telehealth: Payer: Medicare Other

## 2020-12-13 ENCOUNTER — Telehealth: Payer: Self-pay

## 2020-12-13 ENCOUNTER — Ambulatory Visit: Payer: Medicare Other

## 2020-12-13 NOTE — Telephone Encounter (Signed)
  Chronic Care Management   Outreach Note  12/13/2020 Name: [name] MRN: [id number] DOB: [birth year]  Referred by: [name], NP Reason for referral : CCM  Attempted to reach patient for CCM visit today 12/13/20 at 11 AM for medication review. Patient was not available. He was golfing until late afternoon. We rescheduled for 12/21/20 at 10 AM. He also requested to cancel his appt today (AWV) at 1:15 PM. Appt cancelled.   Debbora Dus, PharmD Clinical Pharmacist Practitioner [facility] [phone]

## 2020-12-13 NOTE — Progress Notes (Deleted)
 Chronic Care Management Pharmacy Note  12/13/2020 Name:  [name] MRN:  [id number] DOB:  02/20/1945  Summary: ***  Recommendations/Changes made from today's visit: ***  Plan: ***   Subjective: [name] is an 75 y.o. year old male who is a primary patient of Clark, Katherine K, NP.  The CCM team was consulted for assistance with disease management and care coordination needs.    Engaged with patient by telephone for initial visit in response to provider referral for pharmacy case management and/or care coordination services.   Consent to Services:  The patient was given the following information about Chronic Care Management services today, agreed to services, and gave verbal consent: 1. CCM service includes personalized support from designated clinical staff supervised by the primary care provider, including individualized plan of care and coordination with other care providers 2. 24/7 contact phone numbers for assistance for urgent and routine care needs. 3. Service will only be billed when office clinical staff spend 20 minutes or more in a month to coordinate care. 4. Only one practitioner may furnish and bill the service in a calendar month. 5.The patient may stop CCM services at any time (effective at the end of the month) by phone call to the office staff. 6. The patient will be responsible for cost sharing (co-pay) of up to 20% of the service fee (after annual deductible is met). Patient agreed to services and consent obtained.  Patient Care Team: Clark, Katherine K, NP as PCP - General (Internal Medicine) Varanasi, Jayadeep S, MD as PCP - Cardiology (Cardiology) Adams, Michelle, RPH as Pharmacist (Pharmacist) Miller, Sally, OD (Optometry)  Recent office visits: 12/07/2020 - Katherine Clark, NP - Patient presented for Type 2 Diabetes. Labs: A1c, CMP and Lipid. Stop due to completed course: predniSONE (DELTASONE) 20 MG tablet. CHANGE: Increase Farxiga to 10 mg vs. 5  mg.  08/25/2020 - Katherine Clark, NP - Patient presented for follow up for Type 2 Diabetes. Labs: A1c. Start: dapagliflozin propanediol (FARXIGA) 5 MG TABS tablet. Stop due to completed course: predniSONE (DELTASONE) 20 MG tablet.   Recent consult visits:  09/08/2020 - Family Medicine - Patient presented for Right lumbar radiculopathy. Start: predniSONE (DELTASONE) 20 MG tablet. Labs: DG Lumbar Spine.  08/18/2020 - Cardiology - Patient presented for routine electrophysiology followup. Labs: EKG and in-clinic cup Paceart device check. No medication changes.  08/09/2020 - Family Medicine - Patient presented for back pain. Start: prednisone (DELTASONE) 20 MG tablet for 7 days. Start: tiZANidine (ZANAFLEX) 4 MG tablet PRN for muscle spasms.  0706/2022 - Cardiology - Patient presented for follow-up coronary disease. Lab: EKG. Start: ezetimibe (ZETIA) 10 MG tablet.   Hospital visits:  None in previous 6 months  Objective:  Lab Results  Component Value Date   CREATININE 0.98 12/07/2020   BUN 15 12/07/2020   GFR 75.44 12/07/2020   GFRNONAA >60 01/07/2019   GFRAA >60 01/07/2019   NA 138 12/07/2020   K 3.8 12/07/2020   CALCIUM 9.3 12/07/2020   CO2 27 12/07/2020   GLUCOSE 112 (H) 12/07/2020    Lab Results  Component Value Date/Time   HGBA1C 7.5 (A) 12/07/2020 08:25 AM   HGBA1C 9.3 (A) 08/25/2020 08:24 AM   HGBA1C 9.2 (H) 01/06/2019 01:51 AM   HGBA1C 9.5 (H) 09/13/2018 09:04 AM   GFR 75.44 12/07/2020 08:35 AM   GFR 59.59 (L) 11/26/2019 07:48 AM   MICROALBUR 1.6 11/04/2015 08:26 AM   MICROALBUR 0.2 12/11/2014 01:22 PM    Last   diabetic Eye exam:  Lab Results  Component Value Date/Time   HMDIABEYEEXA Retinopathy (A) 11/26/2019 12:00 AM    Last diabetic Foot exam: No results found for: HMDIABFOOTEX   Lab Results  Component Value Date   CHOL 125 12/07/2020   HDL 38.50 (L) 12/07/2020   LDLCALC 75 12/07/2020   TRIG 60.0 12/07/2020   CHOLHDL 3 12/07/2020    Hepatic Function Latest  Ref Rng & Units 12/07/2020 08/18/2020 11/26/2019  Total Protein 6.0 - 8.3 g/dL 7.3 7.0 6.7  Albumin 3.5 - 5.2 g/dL 4.1 4.6 4.1  AST 0 - 37 U/L 48(H) 16 21  ALT 0 - 53 U/L 114(H) 37 41  Alk Phosphatase 39 - 117 U/L 114 69 56  Total Bilirubin 0.2 - 1.2 mg/dL 3.1(H) 0.3 0.8  Bilirubin, Direct 0.00 - 0.40 mg/dL - 0.14 -    Lab Results  Component Value Date/Time   TSH 1.529 01/06/2019 10:40 AM   TSH 0.748 06/18/2014 04:57 AM    CBC Latest Ref Rng & Units 11/26/2019 01/07/2019 01/05/2019  WBC 4.0 - 10.5 K/uL 7.2 5.7 6.3  Hemoglobin 13.0 - 17.0 g/dL 13.8 12.9(L) 13.9  Hematocrit 39.0 - 52.0 % 42.1 40.0 43.0  Platelets 150.0 - 400.0 K/uL 207.0 263 234    No results found for: VD25OH  Clinical ASCVD: Yes  The ASCVD Risk score (Arnett DK, et al., 2019) failed to calculate for the following reasons:   The patient has a prior MI or stroke diagnosis    Depression screen PHQ 2/9 08/25/2020 11/26/2019 09/13/2018  Decreased Interest 0 0 0  Down, Depressed, Hopeless 0 0 0  PHQ - 2 Score 0 0 0  Altered sleeping 0 0 0  Tired, decreased energy 0 0 0  Change in appetite 0 0 0  Feeling bad or failure about yourself  0 0 0  Trouble concentrating 0 0 0  Moving slowly or fidgety/restless 0 0 0  Suicidal thoughts 0 0 0  PHQ-9 Score 0 0 0  Difficult doing work/chores Not difficult at all Not difficult at all Not difficult at all  Some recent data might be hidden    Social History   Tobacco Use  Smoking Status Never  Smokeless Tobacco Never   BP Readings from Last 3 Encounters:  12/07/20 126/66  09/08/20 130/64  08/25/20 134/68   Pulse Readings from Last 3 Encounters:  12/07/20 68  09/08/20 88  08/25/20 82   Wt Readings from Last 3 Encounters:  12/07/20 212 lb (96.2 kg)  09/08/20 214 lb 8 oz (97.3 kg)  08/25/20 211 lb (95.7 kg)   BMI Readings from Last 3 Encounters:  12/07/20 33.20 kg/m  09/08/20 33.60 kg/m  08/25/20 33.05 kg/m    Assessment/Interventions: Review of patient past  medical history, allergies, medications, health status, including review of consultants reports, laboratory and other test data, was performed as part of comprehensive evaluation and provision of chronic care management services.   SDOH:  (Social Determinants of Health) assessments and interventions performed: Yes  SDOH Screenings   Alcohol Screen: Not on file  Depression (PHQ2-9): Low Risk    PHQ-2 Score: 0  Financial Resource Strain: Not on file  Food Insecurity: Not on file  Housing: Not on file  Physical Activity: Not on file  Social Connections: Not on file  Stress: Not on file  Tobacco Use: Low Risk    Smoking Tobacco Use: Never   Smokeless Tobacco Use: Never   Passive Exposure: Not on file    Transportation Needs: Not on file    CCM Care Plan  Allergies  Allergen Reactions   Lipitor [Atorvastatin] Other (See Comments)    Myopathy Spring 2006   Pravastatin Other (See Comments)    LEG CRAMPS   Simvastatin Other (See Comments)    LEG CRAMPS    Medications Reviewed Today     Reviewed by Clark, Katherine K, NP (Nurse Practitioner) on 12/07/20 at 0824  Med List Status: <None>   Medication Order Taking? Sig Documenting Provider Last Dose Status Informant  acetaminophen (TYLENOL) 325 MG tablet [id number] Yes Take 1-2 tablets (325-650 mg total) by mouth every 4 (four) hours as needed for mild pain. Tillery, Michael Andrew, PA-C Taking Active Self  carvedilol (COREG) 6.25 MG tablet [id number] Yes TAKE 1 TABLET BY MOUTH TWICE A DAY WITH MEAL Varanasi, Jayadeep S, MD Taking Active   dapagliflozin propanediol (FARXIGA) 5 MG TABS tablet [id number] Yes Take 1 tablet (5 mg total) by mouth daily before breakfast. For diabetes Clark, Katherine K, NP Taking Active   ELIQUIS 5 MG TABS tablet [id number] Yes TAKE 1 TABLET BY MOUTH TWICE A DAY Allred, James, MD Taking Active   ezetimibe (ZETIA) 10 MG tablet [id number] Yes Take 1 tablet (10 mg total) by mouth daily. Varanasi, Jayadeep S, MD Taking  Active   glipiZIDE (GLUCOTROL XL) 10 MG 24 hr tablet [id number] Yes Take 1 tablet (10 mg total) by mouth daily with breakfast. For diabetes. Clark, Katherine K, NP Taking Active   insulin NPH Human (NOVOLIN N RELION) 100 UNIT/ML injection [id number] Yes Inject 55 units into the skin every morning and 55 units into the skin in the evening. Clark, Katherine K, NP Taking Active Self  Insulin Pen Needle 31G X 8 MM MISC [id number] Yes Use as directed with Lantus. Clark, Katherine K, NP Taking Active Self  insulin regular (NOVOLIN R) 100 units/mL injection [id number] Yes Inject 0.25 mLs (25 Units total) into the skin 3 (three) times daily before meals.  Patient taking differently: Inject 25 Units into the skin 3 (three) times daily before meals. Only doing twice a day   Clark, Katherine K, NP Taking Active   isosorbide mononitrate (IMDUR) 30 MG 24 hr tablet [id number] Yes Take 1 tablet (30 mg total) by mouth daily. Varanasi, Jayadeep S, MD Taking Active   lisinopril (ZESTRIL) 5 MG tablet [id number] Yes TAKE 1 TABLET (5 MG TOTAL) BY MOUTH DAILY. PLEASE KEEP UPCOMING APPT FOR FUTURE REFILLS. Varanasi, Jayadeep S, MD Taking Active   nitroGLYCERIN (NITROSTAT) 0.4 MG SL tablet [id number] Yes Place 1 tablet (0.4 mg total) under the tongue every 5 (five) minutes as needed for chest pain. Varanasi, Jayadeep S, MD Taking Active   OVER THE COUNTER MEDICATION [id number] Yes Place 1 drop into both eyes 2 (two) times daily. liquid msm eye drops/ for floaters in the eyes [provider] Taking Active Self  tamsulosin (FLOMAX) 0.4 MG CAPS capsule [id number] Yes Take 1 capsule (0.4 mg total) by mouth daily. For urine flow Clark, Katherine K, NP Taking Active   tiZANidine (ZANAFLEX) 4 MG tablet [id number] Yes Take 0.5-1 tablets (2-4 mg total) by mouth at bedtime as needed for muscle spasms. Copland, Spencer, MD Taking Active             Patient Active Problem List   Diagnosis Date Noted   Syncope and collapse  04/11/2019   ICD (implantable cardioverter-defibrillator) in place 04/11/2019   Unstable angina (HCC) 01/05/2019   Diarrhea 12/26/2018   Allergic rhinitis 06/21/2018     NSTEMI (non-ST elevated myocardial infarction) (HCC) 08/02/2015   Insomnia 07/21/2014   Hx of CABG 07/19/2014   Nonsustained ventricular tachycardia 06/23/2014   Cardiomyopathy, ischemic    CAD (coronary artery disease) of artery bypass graft    Junctional bradycardia    Coronary artery disease of native heart with stable angina pectoris (HCC) 05/12/2014   Hyperlipidemia 04/21/2013   NASH (nonalcoholic steatohepatitis) 09/06/2011   Rotator cuff tendinitis 07/28/2011   Hypertension    BPH (benign prostatic hyperplasia) 03/11/2011   IBS (irritable bowel syndrome) 03/11/2011   Bursitis of hip 03/11/2011   DM type 2 (diabetes mellitus, type 2) (HCC) 01/21/2009   ATRIAL FIBRILLATION, PAROXYSMAL 01/21/2009    Immunization History  Administered Date(s) Administered   Fluad Quad(high Dose 65+) 10/28/2019   Influenza Split 11/02/2013   Influenza,inj,Quad PF,6+ Mos 11/08/2015, 10/24/2016, 09/07/2017, 09/17/2018   Influenza-Unspecified 11/02/2012   PFIZER(Purple Top)SARS-COV-2 Vaccination 02/28/2019, 03/26/2019, 10/28/2019   Pneumococcal Conjugate-13 12/11/2014   Pneumococcal Polysaccharide-23 05/25/2005, 08/24/2016   Tdap 01/16/2011   Zoster Recombinat (Shingrix) 09/21/2018, 12/18/2018   Zoster, Live 03/02/2013    Conditions to be addressed/monitored:  {USCCMDZASSESSMENTOPTIONS:23563}  There are no care plans that you recently modified to display for this patient.   Current Barriers:  {pharmacybarriers:24917}  Pharmacist Clinical Goal(s):  Patient will {PHARMACYGOALCHOICES:24921} through collaboration with PharmD and provider.   Interventions: 1:1 collaboration with Clark, Katherine K, NP regarding development and update of comprehensive plan of care as evidenced by provider attestation and  co-signature Inter-disciplinary care team collaboration (see longitudinal plan of care) Comprehensive medication review performed; medication list updated in electronic medical record  Hypertension (BP goal <130/80) -{US controlled/uncontrolled:25276} -Current treatment: Lisinopril 5 mg - 1 tablet daily  Carvedilol 6.25 mg - 1 BID Isosorbide Mononitrate 30 mg - 1 tablet daily -Medications previously tried: ***  -Current home readings: *** -Current dietary habits: *** -Current exercise habits: *** -{ACTIONS;DENIES/REPORTS:21021675::"Denies"} hypotensive/hypertensive symptoms -Educated on {CCM BP Counseling:25124} -Counseled to monitor BP at home ***, document, and provide log at future appointments -{CCMPHARMDINTERVENTION:25122}  Hyperlipidemia: (LDL goal < 70) -{US controlled/uncontrolled:25276} -Current treatment: Zetia 10 mg - 1 tablet daily -Medications previously tried: ***  -Current dietary patterns: *** -Current exercise habits: *** -Educated on {CCM HLD Counseling:25126} -{CCMPHARMDINTERVENTION:25122}  Diabetes (A1c goal <7%) -{US controlled/uncontrolled:25276} -Current medications: Farxiga 10 mg - 1 tablet daily Glipizide 10 mg - 1 tablet daily Insulin NPH (Novolin N) - Inject 55 units twice daily Insulin regular (Novolin R) - Inject 25 units before meals -Medications previously tried: ***  -Current home glucose readings fasting glucose: *** post prandial glucose: *** -{ACTIONS;DENIES/REPORTS:21021675::"Denies"} hypoglycemic/hyperglycemic symptoms -Current meal patterns:  breakfast: ***  lunch: ***  dinner: *** snacks: *** drinks: *** -Current exercise: *** -Educated on {CCM DM COUNSELING:25123} -Counseled to check feet daily and get yearly eye exams -{CCMPHARMDINTERVENTION:25122}  Atrial Fibrillation (Goal: prevent stroke and major bleeding) -{US controlled/uncontrolled:25276} -CHADSVASC: *** -Current treatment: Rate control: Carvedilol 6.25 mg - 1  BID Anticoagulation: Eliquis 5 mg - 1 BID -Medications previously tried: *** -Home BP and HR readings: ***  -Counseled on {CCMAFIBCOUNSELING:25120} -{CCMPHARMDINTERVENTION:25122}  BPH (Goal: ***) -{US controlled/uncontrolled:25276} -Current treatment  Tamsulosin 0.4 mg - 1 capsule daily -Medications previously tried: ***  -{CCMPHARMDINTERVENTION:25122}  Patient Goals/Self-Care Activities Patient will:  - {pharmacypatientgoals:24919}  Follow Up Plan: {CM FOLLOW UP PLAN:22241}  Medication Assistance: {MEDASSISTANCEINFO:25044}  Farxiga 10 mg - patient assistance  Compliance/Adherence/Medication fill history: Care Gaps: Eye exam   Star-Rating Drugs: Medication:                  Last Fill:         Day Supply Lisinopril 5 mg             09/30/2020      90                     Novolin R 100 units     Patient reports getting through [facility]             Glipizide 10 mg           11/21/2020      90 Novolin N 100 units     Patient reports getting through [employer] dates verified with [facility]   Patient's preferred pharmacy is:  [facility] - [address], [place] [address] Phone: [phone] Fax: [phone]  [id number] [place]Phone: [phone]: [address] [address] [place]Phone: [phone]: [phone] Uses pill box? {Yes or If no, why not?:20788} Pt endorses ***% compliance  We discussed: {Pharmacy options:24294} Patient decided to: {US Pharmacy PYBWL:89373} Care Plan and Follow Up Patient Decision:  {{SKAJGOTL:57262} MDebbora Dus PharmD Clinical Pharmacist  [phone]

## 2020-12-14 ENCOUNTER — Other Ambulatory Visit: Payer: Self-pay | Admitting: Primary Care

## 2020-12-14 DIAGNOSIS — K7581 Nonalcoholic steatohepatitis (NASH): Secondary | ICD-10-CM

## 2020-12-16 ENCOUNTER — Telehealth: Payer: Self-pay

## 2020-12-16 NOTE — Chronic Care Management (AMB) (Addendum)
Chronic Care Management Pharmacy Assistant   Name: [name]  MRN: [id number] DOB: 1945-02-22   Reason for Encounter: Reminder Call   Conditions to be addressed/monitored: CAD, HTN, HLD, and DMII   Medications: Outpatient Encounter Medications as of 12/16/2020  Medication Sig   acetaminophen (TYLENOL) 325 MG tablet Take 1-2 tablets (325-650 mg total) by mouth every 4 (four) hours as needed for mild pain.   carvedilol (COREG) 6.25 MG tablet TAKE 1 TABLET BY MOUTH TWICE A DAY WITH MEAL   dapagliflozin propanediol (FARXIGA) 10 MG TABS tablet Take 1 tablet (10 mg total) by mouth daily before breakfast. For diabetes.   ELIQUIS 5 MG TABS tablet TAKE 1 TABLET BY MOUTH TWICE A DAY   ezetimibe (ZETIA) 10 MG tablet Take 1 tablet (10 mg total) by mouth daily.   glipiZIDE (GLUCOTROL XL) 10 MG 24 hr tablet Take 1 tablet (10 mg total) by mouth daily with breakfast. For diabetes.   insulin NPH Human (NOVOLIN N RELION) 100 UNIT/ML injection Inject 55 units into the skin every morning and 55 units into the skin in the evening.   Insulin Pen Needle 31G X 8 MM MISC Use as directed with Lantus.   insulin regular (NOVOLIN R) 100 units/mL injection Inject 0.25 mLs (25 Units total) into the skin 3 (three) times daily before meals. (Patient taking differently: Inject 25 Units into the skin 3 (three) times daily before meals. Only doing twice a day)   isosorbide mononitrate (IMDUR) 30 MG 24 hr tablet Take 1 tablet (30 mg total) by mouth daily.   lisinopril (ZESTRIL) 5 MG tablet TAKE 1 TABLET (5 MG TOTAL) BY MOUTH DAILY. PLEASE KEEP UPCOMING APPT FOR FUTURE REFILLS.   nitroGLYCERIN (NITROSTAT) 0.4 MG SL tablet Place 1 tablet (0.4 mg total) under the tongue every 5 (five) minutes as needed for chest pain.   OVER THE COUNTER MEDICATION Place 1 drop into both eyes 2 (two) times daily. liquid msm eye drops/ for floaters in the eyes   tamsulosin (FLOMAX) 0.4 MG CAPS capsule Take 1 capsule (0.4 mg total) by  mouth daily. For urine flow   tiZANidine (ZANAFLEX) 4 MG tablet Take 0.5-1 tablets (2-4 mg total) by mouth at bedtime as needed for muscle spasms.   No facility-administered encounter medications on file as of 12/16/2020.    [name] was contacted to remind him of his upcoming telephone visit with Debbora Dus on 12/21/20 at 10:00am. Patient was reminded to have all medications, supplements and any blood glucose and blood pressure readings available for review at appointment.  The patient reports he has ultrasound scheduled 12/21/20 early for Alma Friendly, NP.  He may be a few minutes late if imaging center is behind on schedule.   Are you having any problems with your medications? No  Do you have any concerns you like to discuss with the pharmacist? No The patient reports he is feeling better.  Star Rating Drugs: Medication:  Last Fill: Day Supply Lisinopril 5 mg             09/30/2020      90                     Novolin R 100 units     Patient reports getting through [facility]             Glipizide 10 mg           11/21/2020  90 Novolin N 100 units     Patient reports getting through [place] [facility]   Debbora Dus, CPP notified  Avel Sensor, [place] Assistant [phone]

## 2020-12-21 ENCOUNTER — Telehealth: Payer: Self-pay

## 2020-12-21 ENCOUNTER — Ambulatory Visit
Admission: RE | Admit: 2020-12-21 | Discharge: 2020-12-21 | Disposition: A | Discharge status: Home / Self Care | Payer: Medicare Other | Source: Ambulatory Visit | Attending: Primary Care | Admitting: Primary Care

## 2020-12-21 ENCOUNTER — Other Ambulatory Visit: Payer: Self-pay

## 2020-12-21 ENCOUNTER — Ambulatory Visit (INDEPENDENT_AMBULATORY_CARE_PROVIDER_SITE_OTHER): Payer: Medicare Other

## 2020-12-21 DIAGNOSIS — K7581 Nonalcoholic steatohepatitis (NASH): Secondary | ICD-10-CM | POA: Insufficient documentation

## 2020-12-21 DIAGNOSIS — I10 Essential (primary) hypertension: Secondary | ICD-10-CM

## 2020-12-21 DIAGNOSIS — E782 Mixed hyperlipidemia: Secondary | ICD-10-CM

## 2020-12-21 DIAGNOSIS — E1122 Type 2 diabetes mellitus with diabetic chronic kidney disease: Secondary | ICD-10-CM

## 2020-12-21 DIAGNOSIS — K7689 Other specified diseases of liver: Secondary | ICD-10-CM | POA: Diagnosis not present

## 2020-12-21 NOTE — Progress Notes (Signed)
Chronic Care Management Pharmacy Note  12/21/2020 Name:  [name] MRN:  [id number] DOB:  December 13, 1945  Subjective: [name] is an 75 y.o. year old male who is a primary patient of [name], NP.  The CCM team was consulted for assistance with disease management and care coordination needs.    Engaged with patient by telephone for initial visit in response to provider referral for pharmacy case management and/or care coordination services.   Consent to Services:  The patient was given the following information about Chronic Care Management services today, agreed to services, and gave verbal consent: 1. CCM service includes personalized support from designated clinical staff supervised by the primary care provider, including individualized plan of care and coordination with other care providers 2. 24/7 contact phone numbers for assistance for urgent and routine care needs. 3. Service will only be billed when office clinical staff spend 20 minutes or more in a month to coordinate care. 4. Only one practitioner may furnish and bill the service in a calendar month. 5.The patient may stop CCM services at any time (effective at the end of the month) by phone call to the office staff. 6. The patient will be responsible for cost sharing (co-pay) of up to 20% of the service fee (after annual deductible is met). Patient agreed to services and consent obtained.  Patient Care Team: [name], NP as PCP - General (Internal Medicine) Jettie Booze, MD as PCP - Cardiology (Cardiology) Debbora Dus, [facility] as Pharmacist (Pharmacist) Marica Otter, [place] (Optometry)  Recent office visits: 12/07/2020 - Alma Friendly, NP - Patient presented for Type 2 Diabetes. Labs: A1c, CMP and Lipid. Stop due to completed course: predniSONE (DELTASONE) 20 MG tablet. CHANGE: Increase Farxiga to 10 mg vs. 5 mg.  08/25/2020 - Alma Friendly, NP - Patient presented for follow up for Type 2  Diabetes. Labs: A1c. Start: dapagliflozin propanediol (FARXIGA) 5 MG TABS tablet. Stop due to completed course: predniSONE (DELTASONE) 20 MG tablet.   Recent consult visits:  09/08/2020 - Family Medicine - Patient presented for Right lumbar radiculopathy. Start: predniSONE (DELTASONE) 20 MG tablet. Labs: DG Lumbar Spine.  08/18/2020 - Cardiology - Patient presented for routine electrophysiology followup. Labs: EKG and in-clinic cup Paceart device check. No medication changes.  08/09/2020 - Family Medicine - Patient presented for back pain. Start: prednisone (DELTASONE) 20 MG tablet for 7 days. Start: tiZANidine (ZANAFLEX) 4 MG tablet PRN for muscle spasms.  0706/2022 - Cardiology - Patient presented for follow-up coronary disease. Lab: EKG. Start: ezetimibe (ZETIA) 10 MG tablet.  Hospital visits: None in previous 6 months   Objective:  Lab Results  Component Value Date   CREATININE 0.98 12/07/2020   BUN 15 12/07/2020   GFR 75.44 12/07/2020   GFRNONAA >60 01/07/2019   GFRAA >60 01/07/2019   NA 138 12/07/2020   K 3.8 12/07/2020   CALCIUM 9.3 12/07/2020   CO2 27 12/07/2020   GLUCOSE 112 (H) 12/07/2020    Lab Results  Component Value Date/Time   HGBA1C 7.5 (A) 12/07/2020 08:25 AM   HGBA1C 9.3 (A) 08/25/2020 08:24 AM   HGBA1C 9.2 (H) 01/06/2019 01:51 AM   HGBA1C 9.5 (H) 09/13/2018 09:04 AM   GFR 75.44 12/07/2020 08:35 AM   GFR 59.59 (L) 11/26/2019 07:48 AM   MICROALBUR 1.6 11/04/2015 08:26 AM   MICROALBUR 0.2 12/11/2014 01:22 PM    Last diabetic Eye exam:  Lab Results  Component Value Date/Time   HMDIABEYEEXA Retinopathy (A)  11/26/2019 12:00 AM    Last diabetic Foot exam: No results found for: HMDIABFOOTEX   Lab Results  Component Value Date   CHOL 125 12/07/2020   HDL 38.50 (L) 12/07/2020   LDLCALC 75 12/07/2020   TRIG 60.0 12/07/2020   CHOLHDL 3 12/07/2020    Hepatic Function Latest Ref Rng & Units 12/07/2020 08/18/2020 11/26/2019  Total Protein 6.0 - 8.3 g/dL 7.3  7.0 6.7  Albumin 3.5 - 5.2 g/dL 4.1 4.6 4.1  AST 0 - 37 U/L 48(H) 16 21  ALT 0 - 53 U/L 114(H) 37 41  Alk Phosphatase 39 - 117 U/L 114 69 56  Total Bilirubin 0.2 - 1.2 mg/dL 3.1(H) 0.3 0.8  Bilirubin, Direct 0.00 - 0.40 mg/dL - 0.14 -    Lab Results  Component Value Date/Time   TSH 1.529 01/06/2019 10:40 AM   TSH 0.748 06/18/2014 04:57 AM    CBC Latest Ref Rng & Units 11/26/2019 01/07/2019 01/05/2019  WBC 4.0 - 10.5 K/uL 7.2 5.7 6.3  Hemoglobin 13.0 - 17.0 g/dL 13.8 12.9(L) 13.9  Hematocrit 39.0 - 52.0 % 42.1 40.0 43.0  Platelets 150.0 - 400.0 K/uL 207.0 263 234    No results found for: VD25OH  Clinical ASCVD: Yes  The ASCVD Risk score (Arnett DK, et al., 2019) failed to calculate for the following reasons:   The patient has a prior MI or stroke diagnosis    Depression screen [facility] 2/9 08/25/2020 11/26/2019 09/13/2018  Decreased Interest 0 0 0  Down, Depressed, Hopeless 0 0 0  PHQ - 2 Score 0 0 0  Altered sleeping 0 0 0  Tired, decreased energy 0 0 0  Change in appetite 0 0 0  Feeling bad or failure about yourself  0 0 0  Trouble concentrating 0 0 0  Moving slowly or fidgety/restless 0 0 0  Suicidal thoughts 0 0 0  PHQ-9 Score 0 0 0  Difficult doing work/chores Not difficult at all Not difficult at all Not difficult at all  Some recent data might be hidden     Social History   Tobacco Use  Smoking Status Never  Smokeless Tobacco Never   BP Readings from Last 3 Encounters:  12/07/20 126/66  09/08/20 130/64  08/25/20 134/68   Pulse Readings from Last 3 Encounters:  12/07/20 68  09/08/20 88  08/25/20 82   Wt Readings from Last 3 Encounters:  12/07/20 212 lb (96.2 kg)  09/08/20 214 lb 8 oz (97.3 kg)  08/25/20 211 lb (95.7 kg)   BMI Readings from Last 3 Encounters:  12/07/20 33.20 kg/m  09/08/20 33.60 kg/m  08/25/20 33.05 kg/m    Assessment/Interventions: Review of patient past medical history, allergies, medications, health status, including review of  consultants reports, laboratory and other test data, was performed as part of comprehensive evaluation and provision of chronic care management services.   SDOH:  (Social Determinants of Health) assessments and interventions performed: Yes SDOH Interventions    Flowsheet Row Most Recent Value  SDOH Interventions   Financial Strain Interventions Other (Comment)  [Farxiga - PAP,  Eliquis - Pt to let us know when he goes into coverage gap in 2023.]      SDOH Screenings   Alcohol Screen: Not on file  Depression (PHQ2-9): Low Risk    PHQ-2 Score: 0  Financial Resource Strain: High Risk   Difficulty of Paying Living Expenses: Hard  Food Insecurity: Not on file  Housing: Not on file  Physical Activity: Not on file  Social Connections: Not on file  Stress: Not on file  Tobacco Use: Low Risk    Smoking Tobacco Use: Never   Smokeless Tobacco Use: Never   Passive Exposure: Not on file  Transportation Needs: Not on file    [place]  Allergies  Allergen Reactions   Lipitor [Atorvastatin] Other (See Comments)    Myopathy Spring 2006   Pravastatin Other (See Comments)    LEG CRAMPS   Simvastatin Other (See Comments)    LEG CRAMPS    Medications Reviewed Today     Reviewed by Debbora Dus, [facility] (Pharmacist) on 12/21/20 at 1255  Med List Status: <None>   Medication Order Taking? Sig Documenting Provider Last Dose Status Informant  acetaminophen (TYLENOL) 325 MG tablet [id number] No Take 1-2 tablets (325-650 mg total) by mouth every 4 (four) hours as needed for mild pain.  Patient not taking: Reported on 12/21/2020   Shirley Friar, PA-C Not Taking Active Self  carvedilol (COREG) 6.25 MG tablet [id number] Yes TAKE 1 TABLET BY MOUTH TWICE A DAY WITH MEAL Jettie Booze, MD Taking Active   dapagliflozin propanediol (FARXIGA) 10 MG TABS tablet [id number] Yes Take 1 tablet (10 mg total) by mouth daily before breakfast. For diabetes. [name], NP Taking Active    ELIQUIS 5 MG TABS tablet [id number] Yes TAKE 1 TABLET BY MOUTH TWICE A DAY Allred, James, MD Taking Active   ezetimibe (ZETIA) 10 MG tablet [id number] Yes Take 1 tablet (10 mg total) by mouth daily. Jettie Booze, MD Taking Active   glipiZIDE (GLUCOTROL XL) 10 MG 24 hr tablet [id number] No Take 1 tablet (10 mg total) by mouth daily with breakfast. For diabetes.  Patient not taking: Reported on 12/21/2020   [name], NP Not Taking Active   insulin NPH Human (NOVOLIN N RELION) 100 UNIT/ML injection [id number] Yes Inject 55 units into the skin every morning and 55 units into the skin in the evening. [name], NP Taking Active Self  Insulin Pen Needle 31G X 8 MM MISC [id number] Yes Use as directed with Lantus. [name], NP Taking Active Self  insulin regular (NOVOLIN R) 100 units/mL injection [id number] Yes Inject 0.25 mLs (25 Units total) into the skin 3 (three) times daily before meals.  Patient taking differently: Inject 25 Units into the skin 3 (three) times daily before meals. Only doing twice a day   [name], NP Taking Active   isosorbide mononitrate (IMDUR) 30 MG 24 hr tablet [id number] Yes Take 1 tablet (30 mg total) by mouth daily. Jettie Booze, MD Taking Active   lisinopril (ZESTRIL) 5 MG tablet [id number] Yes TAKE 1 TABLET (5 MG TOTAL) BY MOUTH DAILY. PLEASE KEEP UPCOMING APPT FOR FUTURE REFILLS. Jettie Booze, MD Taking Active   nitroGLYCERIN (NITROSTAT) 0.4 MG SL tablet [id number] Yes Place 1 tablet (0.4 mg total) under the tongue every 5 (five) minutes as needed for chest pain. Jettie Booze, MD Taking Active   OVER THE COUNTER MEDICATION [id number] Yes Place 1 drop into both eyes 2 (two) times daily. liquid msm eye drops/ for floaters in the eyes [provider] Taking Active Self  tamsulosin (FLOMAX) 0.4 MG CAPS capsule [id number] Yes Take 1 capsule (0.4 mg total) by mouth daily. For urine flow [name], NP  Taking Active   tiZANidine (ZANAFLEX) 4 MG tablet [id number] Yes Take 0.5-1 tablets (2-4 mg total) by mouth at bedtime as needed for muscle spasms. Copland,  Frederico Hamman, MD Taking Active             Patient Active Problem List   Diagnosis Date Noted   Syncope and collapse 04/11/2019   ICD (implantable cardioverter-defibrillator) in place 04/11/2019   Unstable angina ([place]) 01/05/2019   Diarrhea 12/26/2018   Allergic rhinitis 06/21/2018   NSTEMI (non-ST elevated myocardial infarction) ([place]) 08/02/2015   Insomnia 07/21/2014   Hx of CABG 07/19/2014   Nonsustained ventricular tachycardia 06/23/2014   Cardiomyopathy, ischemic    CAD (coronary artery disease) of artery bypass graft    Junctional bradycardia    Coronary artery disease of native heart with stable angina pectoris ([place]) 05/12/2014   Hyperlipidemia 04/21/2013   NASH (nonalcoholic steatohepatitis) 09/06/2011   Rotator cuff tendinitis 07/28/2011   Hypertension    BPH (benign prostatic hyperplasia) 03/11/2011   IBS (irritable bowel syndrome) 03/11/2011   Bursitis of hip 03/11/2011   DM type 2 (diabetes mellitus, type 2) ([place]) 01/21/2009   ATRIAL FIBRILLATION, PAROXYSMAL 01/21/2009    Immunization History  Administered Date(s) Administered   Fluad Quad(high Dose 65+) 10/28/2019   Influenza Split 11/02/2013   Influenza,inj,Quad PF,6+ Mos 11/08/2015, 10/24/2016, 09/07/2017, 09/17/2018   Influenza-Unspecified 11/02/2012   PFIZER(Purple Top)SARS-COV-2 Vaccination 02/28/2019, 03/26/2019, 10/28/2019   Pneumococcal Conjugate-13 12/11/2014   Pneumococcal Polysaccharide-23 05/25/2005, 08/24/2016   Tdap 01/16/2011   Zoster Recombinat (Shingrix) 09/21/2018, 12/18/2018   Zoster, Live 03/02/2013    Conditions to be addressed/monitored:  Hypertension, Hyperlipidemia, Diabetes, Atrial Fibrillation, Coronary Artery Disease, and BPH  Care Plan : [place]  Updates made by Debbora Dus, [place] since 12/21/2020 12:00 AM      Problem: CHL AMB "PATIENT-SPECIFIC PROBLEM"      Long-Range Goal: Patient Stated   Start Date: 12/21/2020  Priority: High  Note:     Current Barriers:  None identified  Pharmacist Clinical Goal(s):  Patient will contact provider office for questions/concerns as evidenced notation of same in electronic health record through collaboration with PharmD and provider.   Interventions: 1:1 collaboration with [name], NP regarding development and update of comprehensive plan of care as evidenced by provider attestation and co-signature Inter-disciplinary care team collaboration (see longitudinal plan of care) Comprehensive medication review performed; medication list updated in electronic medical record  Hypertension (BP goal <140/90) -Controlled - per clinic readings -Confirms adherence  -Comorbidities: CAD -Current treatment: Carvedilol 6.25 mg - 1 tablet twice daily Isosorbide Mononitrate 30 mg - 1 tablet daily Lisinopril 5 mg - 1 tablet daily -Medications previously tried: none  -Current home readings: none reported -Denies hypotensive/hypertensive symptoms; Denies chest pain, SOB, dizziness, falls. -Educated on importance of daily adherence; reviewed refill history -Counseled to monitor BP at home if symptomatic, document, and provide log at future appointments -Recommended to continue current medication  Hyperlipidemia: (LDL goal < 70) -Not ideally Controlled - LDL 75 (12/07/20) Managed by cardiology, started on Zetia 06/2020, next visit January 2022 -Confirms adherence  -Current treatment: Zetia 10 mg - 1 tablet daily  -Medications previously tried: statins  -Educated on Cholesterol goals;  -Recommended to continue current medication  Diabetes (A1c goal <7%) -Not ideally controlled - he is very happy with current control, last A1c improved to 7.5% after starting [place]. He is enrolled in [employer] [place] assistance program, but only has 2 tablets on hand. He  purchases his insulin OTC from [facility]. -Current medications: Farxiga 10 mg - 1 tablet daily  Novolin N -  55 units BID (long acting) Novolin R - 25 units BID  with meals (short acting) Glipizide XL 10 mg - 1 tablet daily before breakfast (not taking - stopped on his own to due low BG) -Medications previously tried: none reported  -Current home glucose readings - Checking TID - before breakfast, before lunch, after supper  fasting glucose: 90-130 (lowest 77) post prandial glucose: 150-180 (highest - 200) -Reports hypoglycemic/hyperglycemic symptoms -Pt reports BG has never dropped lower than 70, but he feels bad when its < 100 -Current meal patterns: eats "normal" foods, does not limit diet;  eats 3 meals/day, does not skip meals  snacks: Milkyway - carries around for symptoms of low BG, states this occurs about once daily in the afternoon. His BG will be around 100, never below 70. -Current exercise: none reported -Educated on A1c and blood sugar goals; Prevention and management of hypoglycemic episodes; Benefits of routine self-monitoring of blood sugar; -Counseled to check feet daily and get yearly eye exams; He reports eye exam January each year, [facility]  -Recommended to continue current medication; Will have CCM team call [employer] to check on Farxiga refill.  Atrial Fibrillation (Goal: prevent stroke and major bleeding) -Followed by cardiology -CHADSVASC: 5 -Current treatment: Rate control: Carvedilol 6.25 mg - 1 tablet twice daily Anticoagulation: Eliquis 5 mg - 1 tablet twice daily -Medications previously tried: none reported -Home BP and HR readings: none reported -Counseled on increased risk of stroke due to Afib and benefits of anticoagulation for stroke prevention; importance of adherence to anticoagulant exactly as prescribed; avoidance of NSAIDs due to increased bleeding risk with anticoagulants; -Inquired about Eliquis affordability - Pr reports $49/30 day supply;  He usually goes into donut hole towards end of the year and rations the Eliquis at that time. He takes it once daily until he can afford it again. However, he got a new supply 2 weeks ago and currently taking BID, as prescribed. He reports he switching insurance at beginning of 2023 and his cost may change. I counseled him on seeking help/contacting CCM team if Eliquis unaffordable in the future. -Discussed NSAID use, he takes Advil before golf 2-3 days per week. Tylenol ineffective. Discussed bleeding risk and limiting use to lowest dose and to report any s/s of bleeding. -Recommended to continue current medication  BPH (Goal: Improve symptoms) -Controlled - works well for patient -Pt affirms adherence -Current treatment  Flomax 0.4 mg - 1 capsule daily  -Medications previously tried: none reported  -Recommended to continue current medication  OTC/PRN - does not take aspirin or any other vitamins/supplements  Keeps nitroglycerin with him daily just in case  Patient Goals/Self-Care Activities Patient will:  - take medications as prescribed as evidenced by patient report and record review  Follow Up Plan: Telephone follow up appointment with care management team member scheduled for: 3 months [name])    Medication Assistance:  Farxiga 10 mg obtained through A, Z, and Me  medication assistance program.  Enrollment ends 01/01/22  Compliance/Adherence/Medication fill history: Care Gaps: Eye Exam due - pt reports scheduled January 2022   Star-Rating Drugs: Lisinopril 5 mg             09/30/2020      90                     Fill dates verified with [facility] by CMA  Patient's [facility] is: [id number] [place] [place] - [id number]: [phone]: [phone] Uses pill box? No -  doesn't see a need Pt endorses 100% compliance  We discussed: Current pharmacy is preferred with insurance plan and patient is  satisfied with pharmacy services Patient decided to: Continue current medication management strategy  Care Plan and Follow Up Patient Decision:  Patient agrees to Care Plan and Follow-up.  Debbora Dus, PharmD Clinical Pharmacist  [facility] at [facility] [phone]

## 2020-12-21 NOTE — Progress Notes (Signed)
° ° °  Chronic Care Management Pharmacy Assistant   Name: [name]  MRN: [id number] DOB: November 11, 1945  Reason for Encounter: CCM ([place])   Called [place] [employer] [place] to check the status of patient's Farxiga delivery. Patient has two pills remaining. Representative stated it has not shipped out; I informed her the patient only had two pills remaining and would be out of this medication on 12/22. Representative stated she was going to escalate this case to her supervisor who could process the medication urgently. I was advised to call back tomorrow 12/21 to [name] for updated shipping information. Pharmacy phone number is [phone]. Case reference number is [id number]. I called patient to inform him of the status and informed him I would let him know the status tomorrow after I call [name], CPP notified  [name], [place] [phone]  Time Spent: 20 Minutes

## 2020-12-21 NOTE — Patient Instructions (Signed)
Dear [name],  Below is a summary of the goals we discussed during our follow up appointment on December 21, 2020. Please contact me anytime with questions or concerns.   Visit Information  Patient Care Plan: CCM Pharmacy Care Plan     Problem Identified: [facility] AMB "PATIENT-SPECIFIC PROBLEM"      Long-Range Goal: Patient Stated   Start Date: 12/21/2020  Priority: High  Note:     Current Barriers:  None identified  Pharmacist Clinical Goal(s):  Patient will contact provider office for questions/concerns as evidenced notation of same in electronic health record through collaboration with PharmD and provider.   Interventions: 1:1 collaboration with [name], NP regarding development and update of comprehensive plan of care as evidenced by provider attestation and co-signature Inter-disciplinary care team collaboration (see longitudinal plan of care) Comprehensive medication review performed; medication list updated in electronic medical record  Hypertension (BP goal <140/90) -Controlled - per clinic readings -Confirms adherence  -Comorbidities: CAD -Current treatment: Carvedilol 6.25 mg - 1 tablet twice daily Isosorbide Mononitrate 30 mg - 1 tablet daily Lisinopril 5 mg - 1 tablet daily -Medications previously tried: none  -Current home readings: none reported -Denies hypotensive/hypertensive symptoms; Denies chest pain, SOB, dizziness, falls. -Educated on importance of daily adherence; reviewed refill history -Counseled to monitor BP at home if symptomatic, document, and provide log at future appointments -Recommended to continue current medication  Hyperlipidemia: (LDL goal < 70) -Not ideally Controlled - LDL 75 (12/07/20) Managed by cardiology, started on Zetia 06/2020, next visit January 2022 -Confirms adherence  -Current treatment: Zetia 10 mg - 1 tablet daily  -Medications previously tried: statins  -Educated on Cholesterol goals;  -Recommended to  continue current medication  Diabetes (A1c goal <7%) -Not ideally controlled - he is very happy with current control, last A1c improved to 7.5% after starting [place]. He is enrolled in [employer] assistance program, but only has 2 tablets on hand. He purchases his insulin OTC from [facility]. -Current medications: Farxiga 10 mg - 1 tablet daily  Novolin N -  55 units BID (long acting) Novolin R - 25 units BID with meals (short acting) Glipizide XL 10 mg - 1 tablet daily before breakfast (not taking - stopped on his own to due low BG) -Medications previously tried: none reported  -Current home glucose readings - Checking TID - before breakfast, before lunch, after supper  fasting glucose: 90-130 (lowest 77) post prandial glucose: 150-180 (highest - 200) -Reports hypoglycemic/hyperglycemic symptoms -Pt reports BG has never dropped lower than 70, but he feels bad when its < 100 -Current meal patterns: eats "normal" foods, does not limit diet;  eats 3 meals/day, does not skip meals  snacks: Milkyway - carries around for symptoms of low BG, states this occurs about once daily in the afternoon. His BG will be around 100, never below 70. -Current exercise: none reported -Educated on A1c and blood sugar goals; Prevention and management of hypoglycemic episodes; Benefits of routine self-monitoring of blood sugar; -Counseled to check feet daily and get yearly eye exams; He reports eye exam January each year, [facility]  -Recommended to continue current medication; Will have CCM team call [employer] to check on Farxiga refill.  Atrial Fibrillation (Goal: prevent stroke and major bleeding) -Followed by cardiology -CHADSVASC: 5 -Current treatment: Rate control: Carvedilol 6.25 mg - 1 tablet twice daily Anticoagulation: Eliquis 5 mg - 1 tablet twice daily -Medications previously tried: none reported -Home BP and HR readings: none reported -Counseled  on increased risk of stroke due to Afib and  benefits of anticoagulation for stroke prevention; importance of adherence to anticoagulant exactly as prescribed; avoidance of NSAIDs due to increased bleeding risk with anticoagulants; -Inquired about Eliquis affordability - Pr reports $49/30 day supply; He usually goes into donut hole towards end of the year and rations the Eliquis at that time. He takes it once daily until he can afford it again. However, he got a new supply 2 weeks ago and currently taking BID, as prescribed. He reports he switching insurance at beginning of 2023 and his cost may change. I counseled him on seeking help/contacting CCM team if Eliquis unaffordable in the future. -Discussed NSAID use, he takes Advil before golf 2-3 days per week. Tylenol ineffective. Discussed bleeding risk and limiting use to lowest dose and to report any s/s of bleeding. -Recommended to continue current medication  BPH (Goal: Improve symptoms) -Controlled - works well for patient -Pt affirms adherence -Current treatment  Flomax 0.4 mg - 1 capsule daily  -Medications previously tried: none reported  -Recommended to continue current medication  OTC/PRN - does not take aspirin or any other vitamins/supplements  Keeps nitroglycerin with him daily just in case  Patient Goals/Self-Care Activities Patient will:  - take medications as prescribed as evidenced by patient report and record review  Follow Up Plan: Telephone follow up appointment with care management team member scheduled for: 3 months [name])     Mr. Falzone was given information about Chronic Care Management services today including:  CCM service includes personalized support from designated clinical staff supervised by his physician, including individualized plan of care and coordination with other care providers 24/7 contact phone numbers for assistance for urgent and routine care needs. Standard insurance, coinsurance, copays and deductibles apply for chronic care  management only during months in which we provide at least 20 minutes of these services. Most insurances cover these services at 100%, however patients may be responsible for any copay, coinsurance and/or deductible if applicable. This service may help you avoid the need for more expensive face-to-face services. Only one practitioner may furnish and bill the service in a calendar month. The patient may stop CCM services at any time (effective at the end of the month) by phone call to the office staff.  Patient agreed to services and verbal consent obtained.   Patient verbalizes understanding of instructions provided today and agrees to view in [place].   Debbora Dus, PharmD Clinical Pharmacist Practitioner [facility] [phone]

## 2021-01-01 DIAGNOSIS — Z794 Long term (current) use of insulin: Secondary | ICD-10-CM

## 2021-01-01 DIAGNOSIS — N182 Chronic kidney disease, stage 2 (mild): Secondary | ICD-10-CM | POA: Diagnosis not present

## 2021-01-01 DIAGNOSIS — E782 Mixed hyperlipidemia: Secondary | ICD-10-CM | POA: Diagnosis not present

## 2021-01-01 DIAGNOSIS — E1122 Type 2 diabetes mellitus with diabetic chronic kidney disease: Secondary | ICD-10-CM

## 2021-01-01 DIAGNOSIS — I10 Essential (primary) hypertension: Secondary | ICD-10-CM | POA: Diagnosis not present

## 2021-01-05 ENCOUNTER — Ambulatory Visit (INDEPENDENT_AMBULATORY_CARE_PROVIDER_SITE_OTHER): Payer: Medicare Other

## 2021-01-05 DIAGNOSIS — I255 Ischemic cardiomyopathy: Secondary | ICD-10-CM | POA: Diagnosis not present

## 2021-01-05 LAB — CUP PACEART REMOTE DEVICE CHECK
Battery Remaining Longevity: 124 mo
Battery Voltage: 3.02 V
Brady Statistic RV Percent Paced: 0.01 %
Date Time Interrogation Session: 20230104033324
HighPow Impedance: 64 Ohm
Implantable Lead Implant Date: 20210105
Implantable Lead Location: 753860
Implantable Pulse Generator Implant Date: 20210105
Lead Channel Impedance Value: 342 Ohm
Lead Channel Impedance Value: 437 Ohm
Lead Channel Pacing Threshold Amplitude: 0.5 V
Lead Channel Pacing Threshold Pulse Width: 0.4 ms
Lead Channel Sensing Intrinsic Amplitude: 10.125 mV
Lead Channel Sensing Intrinsic Amplitude: 10.125 mV
Lead Channel Setting Pacing Amplitude: 2.5 V
Lead Channel Setting Pacing Pulse Width: 0.4 ms
Lead Channel Setting Sensing Sensitivity: 0.3 mV

## 2021-01-05 NOTE — Progress Notes (Signed)
Subjective:   [name] is a 76 y.o. male who presents for Medicare Annual/Subsequent preventive examination.  I connected with Orion Modest today by telephone and verified that I am speaking with the correct person using two identifiers. Location patient: home Location provider: work Persons participating in the virtual visit: patient, Marine scientist.    I discussed the limitations, risks, security and privacy concerns of performing an evaluation and management service by telephone and the availability of in person appointments. I also discussed with the patient that there may be a patient responsible charge related to this service. The patient expressed understanding and verbally consented to this telephonic visit.    Interactive audio and video telecommunications were attempted between this provider and patient, however failed, due to patient having technical difficulties OR patient did not have access to video capability.  We continued and completed visit with audio only.  Some vital signs may be absent or patient reported.   Time Spent with patient on telephone encounter: 20 minutes  Review of Systems     Cardiac Risk Factors include: advanced age (>45men, >30 women);diabetes mellitus;dyslipidemia;hypertension     Objective:    Today's Vitals   01/07/21 1528  Weight: 212 lb (96.2 kg)  Height: 5\' 7"  (1.702 m)   Body mass index is 33.2 kg/m.  Advanced Directives 01/07/2021 02/13/2020 01/06/2019 09/13/2018 09/05/2017 08/24/2016 08/03/2015  Does Patient Have a Medical Advance Directive? Yes Yes Yes Yes Yes Yes Yes  Type of Paramedic of [place];Living will Living will [place];Living will [place];Living will [place];Living will [place];Living will Living will  Does patient want to make changes to medical advance directive? Yes (MAU/Ambulatory/Procedural Areas - Information given) - No -  Patient declined - - - No - Patient declined  Copy of [place] in Chart? - - No - copy requested No - copy requested No - copy requested No - copy requested No - copy requested    Current Medications (verified) Outpatient Encounter Medications as of 01/07/2021  Medication Sig   carvedilol (COREG) 6.25 MG tablet TAKE 1 TABLET BY MOUTH TWICE A DAY WITH MEAL   dapagliflozin propanediol (FARXIGA) 10 MG TABS tablet Take 1 tablet (10 mg total) by mouth daily before breakfast. For diabetes.   ELIQUIS 5 MG TABS tablet TAKE 1 TABLET BY MOUTH TWICE A DAY   ezetimibe (ZETIA) 10 MG tablet Take 1 tablet (10 mg total) by mouth daily.   insulin NPH Human (NOVOLIN N RELION) 100 UNIT/ML injection Inject 55 units into the skin every morning and 55 units into the skin in the evening.   Insulin Pen Needle 31G X 8 MM MISC Use as directed with Lantus.   insulin regular (NOVOLIN R) 100 units/mL injection Inject 0.25 mLs (25 Units total) into the skin 3 (three) times daily before meals. (Patient taking differently: Inject 25 Units into the skin 3 (three) times daily before meals. Only doing twice a day)   isosorbide mononitrate (IMDUR) 30 MG 24 hr tablet Take 1 tablet (30 mg total) by mouth daily.   lisinopril (ZESTRIL) 5 MG tablet TAKE 1 TABLET (5 MG TOTAL) BY MOUTH DAILY. PLEASE KEEP UPCOMING APPT FOR FUTURE REFILLS.   nitroGLYCERIN (NITROSTAT) 0.4 MG SL tablet Place 1 tablet (0.4 mg total) under the tongue every 5 (five) minutes as needed for chest pain.   OVER THE COUNTER MEDICATION Place 1 drop into both eyes 2 (two)  times daily. liquid msm eye drops/ for floaters in the eyes   tamsulosin (FLOMAX) 0.4 MG CAPS capsule Take 1 capsule (0.4 mg total) by mouth daily. For urine flow   tiZANidine (ZANAFLEX) 4 MG tablet Take 0.5-1 tablets (2-4 mg total) by mouth at bedtime as needed for muscle spasms.   acetaminophen (TYLENOL) 325 MG tablet Take 1-2 tablets (325-650 mg total) by mouth every 4 (four) hours  as needed for mild pain. (Patient not taking: Reported on 12/21/2020)   glipiZIDE (GLUCOTROL XL) 10 MG 24 hr tablet Take 1 tablet (10 mg total) by mouth daily with breakfast. For diabetes. (Patient not taking: Reported on 12/21/2020)   No facility-administered encounter medications on file as of 01/07/2021.    Allergies (verified) Lipitor [atorvastatin], Pravastatin, and Simvastatin   History: Past Medical History:  Diagnosis Date   Arthritis    "fingers, shoulders" (08/03/2015)   Atrial fibrillation (HCC)    Paroxysmal, rare episodes, sinus rhythm on flecainide, patient prefers not to take Coumadin   Bradycardia    April, 2013   Chest pain    Nuclear, 2006, no ischemia   Chronic lower back pain    "all my life"   Coronary artery disease    s/p staged cath 05/12/2014 and 5/11, DES x 2 to heavily calcified RCA, residual with 60% prox LAD, 70% D1   Ejection fraction    EF 55-60%,  echo, January, 2011   Elevated CPK    CPK elevated with normal MB and normal troponin the past   Heart murmur    History of echocardiogram    Echo 8/16: EF 55%, normal wall motion, grade 2 diastolic dysfunction, trivial MR, normal RV function, PASP 27 mmHg   Hypertension    IBS (irritable bowel syndrome)    Myocardial infarction ([place]) 06/2014   Sinus drainage    Type II diabetes mellitus ([place])    Past Surgical History:  Procedure Laterality Date   APPENDECTOMY     CARDIAC CATHETERIZATION N/A 05/12/2014   Procedure: Left Heart Cath and Coronary Angiography;  Surgeon: Troy Sine, MD;  Location: [facility];  Service: Cardiovascular;  Laterality: N/A;   CARDIAC CATHETERIZATION N/A 05/13/2014   Procedure: Coronary/Graft Atherectomy;  Surgeon: Troy Sine, MD;  Location: [facility];  Service: Cardiovascular;  Laterality: N/A;   CARDIAC CATHETERIZATION Right 05/13/2014   Procedure: Temporary Pacemaker;  Surgeon: Troy Sine, MD;  Location: [facility];  Service: Cardiovascular;   Laterality: Right;   CARDIAC CATHETERIZATION N/A 05/13/2014   Procedure: Coronary Stent Intervention;  Surgeon: Troy Sine, MD;  Location: [facility];  Service: Cardiovascular;  Laterality: N/A;   CARDIAC CATHETERIZATION N/A 06/18/2014   Procedure: Left Heart Cath and Coronary Angiography;  Surgeon: Peter M Martinique, MD;  Location: [facility];  Service: Cardiovascular;  Laterality: N/A;   CARDIAC CATHETERIZATION N/A 08/03/2015   Procedure: Left Heart Cath and Cors/Grafts Angiography;  Surgeon: Nelva Bush, MD;  Location: [facility];  Service: Cardiovascular;  Laterality: N/A;   CARDIOVASCULAR STRESS TEST  05/06/2014   CATARACT EXTRACTION W/ INTRAOCULAR LENS IMPLANT Left    COLONOSCOPY WITH PROPOFOL N/A 02/13/2020   Procedure: COLONOSCOPY WITH PROPOFOL;  Surgeon: Carol Ada, MD;  [facility];  Service: Endoscopy;  Laterality: N/A;   CORONARY ARTERY BYPASS GRAFT N/A 06/22/2014   Procedure: CORONARY ARTERY BYPASS GRAFTING (CABG) x five, using left internal mammary artery, and right leg greater saphenous vein harvested  endoscopically;  Surgeon: Melrose Nakayama, MD;  Location: [facility];  Service: Open Heart Surgery;  Laterality: N/A;   ICD IMPLANT N/A 01/07/2019   Procedure: ICD IMPLANT;  Surgeon: Thompson Grayer, MD;  Location: [facility];  Service: Cardiovascular;  Laterality: N/A;   LAPAROSCOPIC CHOLECYSTECTOMY     LEFT HEART CATH AND CORS/GRAFTS ANGIOGRAPHY N/A 01/06/2019   Procedure: LEFT HEART CATH AND CORS/GRAFTS ANGIOGRAPHY;  Surgeon: Nelva Bush, MD;  Location: [facility];  Service: Cardiovascular;  Laterality: N/A;   MAZE N/A 06/22/2014   Procedure: MAZE;  Surgeon: Melrose Nakayama, MD;  Location: [facility];  Service: Open Heart Surgery;  Laterality: N/A;   POLYPECTOMY  02/13/2020   Procedure: POLYPECTOMY;  Surgeon: Carol Ada, MD;  [facility];  Service: Endoscopy;;   TEE WITHOUT CARDIOVERSION N/A 06/22/2014   Procedure:  TRANSESOPHAGEAL ECHOCARDIOGRAM (TEE);  Surgeon: Melrose Nakayama, MD;  Location: [facility];  Service: Open Heart Surgery;  Laterality: N/A;   Family History  Problem Relation Age of Onset   Alzheimer's disease Mother    Emphysema Father    Cancer Brother    Arrhythmia Sister    Heart attack Sister    Heart disease Sister    Hyperlipidemia Sister    Hypertension Sister    Social History   Socioeconomic History   Marital status: Married    Spouse name: Not on file   Number of children: Not on file   Years of education: Not on file   Highest education level: Not on file  Occupational History   Not on file  Tobacco Use   Smoking status: Never   Smokeless tobacco: Never  Vaping Use   Vaping Use: Never used  Substance and Sexual Activity   Alcohol use: No   Drug use: No   Sexual activity: Not on file  Other Topics Concern   Not on file  Social History Narrative   Not on file   Social Determinants of Health   Financial Resource Strain: Low Risk    Difficulty of Paying Living Expenses: Not hard at all  Food Insecurity: No Food Insecurity   Worried About Charity fundraiser in the Last Year: Never true   [place] in the Last Year: Never true  Transportation Needs: No Transportation Needs   Lack of Transportation (Medical): No   Lack of Transportation (Non-Medical): No  Physical Activity: Sufficiently Active   Days of Exercise per Week: 3 days   Minutes of Exercise per Session: 150+ min  Stress: No Stress Concern Present   Feeling of Stress : Not at all  Social Connections: Socially Integrated   Frequency of Communication with Friends and Family: More than three times a week   Frequency of Social Gatherings with Friends and Family: More than three times a week   Attends Religious Services: More than 4 times per year   Active Member of [employer] or Organizations: Yes   Attends Music therapist: More than 4 times per year   Marital Status: Married     Tobacco Counseling Counseling given: Not Answered   Clinical Intake:  Pre-visit preparation completed: Yes  Pain : No/denies pain     BMI - recorded: 33.2 Nutritional Status: BMI > 30  Obese Nutritional Risks: None Diabetes: Yes CBG done?: No Did pt. bring in CBG monitor from home?: No  How often do you need to have someone help you when you read instructions, pamphlets, or other  written materials from your doctor or pharmacy?: 1 - Never  Diabetes:  Is the patient diabetic?  Yes  If diabetic, was a CBG obtained today?  No  Did the patient bring in their glucometer from home?  No  How often do you monitor your CBG's? 1 time a week.   Financial Strains and Diabetes Management:  Are you having any financial strains with the device, your supplies or your medication? No .  Does the patient want to be seen by Chronic Care Management for management of their diabetes?  No  Would the patient like to be referred to a Nutritionist or for Diabetic Management?  No   Diabetic Exams:  Diabetic Eye Exam: Completed 04/2020.    Diabetic Foot Exam: Completed 08/25/20.  Interpreter Needed?: No  Information entered by :: Orrin Brigham LPN   Activities of Daily Living In your present state of health, do you have any difficulty performing the following activities: 01/07/2021 08/25/2020  Hearing? N N  Vision? N N  Difficulty concentrating or making decisions? N N  Walking or climbing stairs? N N  Dressing or bathing? N N  Doing errands, shopping? N N  Preparing Food and eating ? N -  Using the Toilet? N -  In the past six months, have you accidently leaked urine? N -  Do you have problems with loss of bowel control? N -  Managing your Medications? N -  Managing your Finances? N -  Housekeeping or managing your Housekeeping? N -  Some recent data might be hidden    Patient Care Team: [name], NP as PCP - General (Internal Medicine) Jettie Booze, MD as PCP -  Cardiology (Cardiology) Debbora Dus, [facility] as Pharmacist (Pharmacist) Marica Otter, OD (Optometry)  Indicate any recent Medical Services you may have received from other than [facility] providers in the past year (date may be approximate).     Assessment:   This is a routine wellness examination for Anh.  Hearing/Vision screen Hearing Screening - Comments:: No issues Vision Screening - Comments:: Last exam 04/2020, [facility]  Dietary issues and exercise activities discussed: Current Exercise Habits: Home exercise routine, Type of exercise: walking (play golf), Time (Minutes): 60 (4-5 hrs), Frequency (Times/Week): 3, Weekly Exercise (Minutes/Week): 180   Goals Addressed             This Visit's Progress    Patient Stated       Would like to maintain current routine       Depression Screen PHQ 2/9 Scores 01/07/2021 08/25/2020 11/26/2019 09/13/2018 09/05/2017 08/24/2016 03/10/2015  PHQ - 2 Score 0 0 0 0 0 2 0  PHQ- 9 Score - 0 0 0 0 2 -    Fall Risk Fall Risk  01/07/2021 08/25/2020 11/26/2019 09/13/2018 09/05/2017  Falls in the past year? 0 0 0 0 No  Comment - - - - -  Number falls in past yr: 0 0 0 - -  Injury with Fall? 0 0 0 - -  Risk for fall due to : No Fall Risks - - Medication side effect -  Follow up Falls prevention discussed - - Falls evaluation completed;Falls prevention discussed -    FALL RISK PREVENTION PERTAINING TO THE HOME:  Any stairs in or around the home? No  If so, are there any without handrails? No  Home free of loose throw rugs in walkways, pet beds, electrical cords, etc? Yes  Adequate lighting in your home to reduce risk  of falls? Yes   ASSISTIVE DEVICES UTILIZED TO PREVENT FALLS:  Life alert? No  Use of a cane, walker or w/c? No  Grab bars in the bathroom? Yes  Shower chair or bench in shower? Yes  Elevated toilet seat or a handicapped toilet? No   TIMED UP AND GO:  Was the test performed? No .    Cognitive Function: Normal cognitive  status assessed by this Nurse Health Advisor. No abnormalities found.   MMSE - Mini Mental State Exam 09/13/2018 09/05/2017 08/24/2016  Orientation to time 5 5 5   Orientation to Place 5 5 5   Registration 3 3 3   Attention/ Calculation 5 0 0  Recall 3 3 2   Recall-comments - - pt was unable to recall 1 of 3 words  Language- name 2 objects 0 0 0  Language- repeat 1 1 1   Language- follow 3 step command 0 3 3  Language- read & follow direction 0 0 0  Write a sentence 0 0 0  Copy design 0 0 0  Total score 22 20 19         Immunizations Immunization History  Administered Date(s) Administered   Fluad Quad(high Dose 65+) 10/28/2019   Influenza Split 11/02/2013   Influenza,inj,Quad PF,6+ Mos 11/08/2015, 10/24/2016, 09/07/2017, 09/17/2018   Influenza-Unspecified 11/02/2012   PFIZER(Purple Top)SARS-COV-2 Vaccination 02/28/2019, 03/26/2019, 10/28/2019   Pneumococcal Conjugate-13 12/11/2014   Pneumococcal Polysaccharide-23 05/25/2005, 08/24/2016   Tdap 01/16/2011   Zoster Recombinat (Shingrix) 09/21/2018, 12/18/2018   Zoster, Live 03/02/2013    TDAP status: Up to date  Flu Vaccine status: Due, Education has been provided regarding the importance of this vaccine. Advised may receive this vaccine at local pharmacy or Health Dept. Aware to provide a copy of the vaccination record if obtained from local pharmacy or Health Dept. Verbalized acceptance and understanding.  Pneumococcal vaccine status: Up to date  Covid-19 vaccine status: Information provided on how to obtain vaccines.   Qualifies for Shingles Vaccine? Yes   Zostavax completed Yes   Shingrix Completed?: Yes  Screening Tests Health Maintenance  Topic Date Due   COVID-19 Vaccine (4 - Booster for Pfizer series) 12/23/2019   INFLUENZA VACCINE  08/02/2020   OPHTHALMOLOGY EXAM  11/25/2020   TETANUS/TDAP  01/15/2021   HEMOGLOBIN A1C  06/07/2021   FOOT EXAM  08/25/2021   Pneumonia Vaccine 80+ Years old  Completed   Hepatitis C  Screening  Completed   Zoster Vaccines- Shingrix  Completed   HPV VACCINES  Aged Out   COLONOSCOPY (Pts 45-22yrs Insurance coverage will need to be confirmed)  Discontinued    Health Maintenance  Health Maintenance Due  Topic Date Due   COVID-19 Vaccine (4 - Booster for [place] series) 12/23/2019   INFLUENZA VACCINE  08/02/2020   OPHTHALMOLOGY EXAM  11/25/2020    Colorectal cancer screening: Type of screening: Colonoscopy. Completed 02/13/20. Repeat every 5 years  Lung Cancer Screening: (Low Dose CT Chest recommended if Age 16-80 years, 30 pack-year currently smoking OR have quit w/in 15years.) does not qualify.    Additional Screening:  Hepatitis C Screening: does qualify; Completed 03/10/15  Vision Screening: Recommended annual ophthalmology exams for early detection of glaucoma and other disorders of the eye. Is the patient up to date with their annual eye exam?  Yes  Who is the provider or what is the name of the office in which the patient attends annual eye exams? [facility]   Dental Screening: Recommended annual dental exams for proper oral hygiene  Community  Resource Referral / Chronic Care Management: CRR required this visit?  No   CCM required this visit?  No      Plan:     I have personally reviewed and noted the following in the patients chart:   Medical and social history Use of alcohol, tobacco or illicit drugs  Current medications and supplements including opioid prescriptions. Patient is not currently taking opioid prescriptions. Functional ability and status Nutritional status Physical activity Advanced directives List of other physicians Hospitalizations, surgeries, and ER visits in previous 12 months Vitals Screenings to include cognitive, depression, and falls Referrals and appointments  In addition, I have reviewed and discussed with patient certain preventive protocols, quality metrics, and best practice recommendations. A written  personalized care plan for preventive services as well as general preventive health recommendations were provided to patient.   Due to this being a telephonic visit, the after visit summary with patients personalized plan was offered to patient via mail or my-chart. Patient would like to access on my-chart.     Loma Messing, LPN   08/10/8914   Nurse Health Advisor  Nurse Notes: none

## 2021-01-07 ENCOUNTER — Ambulatory Visit (INDEPENDENT_AMBULATORY_CARE_PROVIDER_SITE_OTHER): Payer: Medicare Other

## 2021-01-07 VITALS — Ht 67.0 in | Wt 212.0 lb

## 2021-01-07 DIAGNOSIS — Z0000 Encounter for general adult medical examination without abnormal findings: Secondary | ICD-10-CM | POA: Diagnosis not present

## 2021-01-07 DIAGNOSIS — I5022 Chronic systolic (congestive) heart failure: Secondary | ICD-10-CM

## 2021-01-07 DIAGNOSIS — Z95810 Presence of automatic (implantable) cardiac defibrillator: Secondary | ICD-10-CM

## 2021-01-07 NOTE — Patient Instructions (Signed)
[name] , Thank you for taking time to complete your Medicare Wellness Visit. I appreciate your ongoing commitment to your health goals. Please review the following plan we discussed and let me know if I can assist you in the future.   Screening recommendations/referrals: Colonoscopy: up to date, completed 02/13/20, due 02/12/25  Recommended yearly ophthalmology/optometry visit for glaucoma screening and checkup Recommended yearly dental visit for hygiene and checkup  Vaccinations: Influenza vaccine: Due-May obtain vaccine at our office or your local pharmacy. Pneumococcal vaccine: up to date Tdap vaccine: up to date, completed 01/16/11, due 01/15/21 Shingles vaccine: up to date   Covid-19: newest booster available at your local pharmacy   Advanced directives: If you would like please bring a copy of Living Will and/or [place] for your chart.   Conditions/risks identified: see problem list   Next appointment: Follow up in one year for your annual wellness visit. 01/11/22 @ 3:30pm, this will be a telephone visit.  Preventive Care 76 Years and Older, Male Preventive care refers to lifestyle choices and visits with your health care provider that can promote health and wellness. What does preventive care include? A yearly physical exam. This is also called an annual well check. Dental exams once or twice a year. Routine eye exams. Ask your health care provider how often you should have your eyes checked. Personal lifestyle choices, including: Daily care of your teeth and gums. Regular physical activity. Eating a healthy diet. Avoiding tobacco and drug use. Limiting alcohol use. Practicing safe sex. Taking low doses of aspirin every day. Taking vitamin and mineral supplements as recommended by your health care provider. What happens during an annual well check? The services and screenings done by your health care provider during your annual well check will depend on  your age, overall health, lifestyle risk factors, and family history of disease. Counseling  Your health care provider may ask you questions about your: Alcohol use. Tobacco use. Drug use. Emotional well-being. Home and relationship well-being. Sexual activity. Eating habits. History of falls. Memory and ability to understand (cognition). Work and work Statistician. Screening  You may have the following tests or measurements: Height, weight, and BMI. Blood pressure. Lipid and cholesterol levels. These may be checked every 5 years, or more frequently if you are over 34 years old. Skin check. Lung cancer screening. You may have this screening every year starting at age 78 if you have a 30-pack-year history of smoking and currently smoke or have quit within the past 15 years. Fecal occult blood test (FOBT) of the stool. You may have this test every year starting at age 20. Flexible sigmoidoscopy or colonoscopy. You may have a sigmoidoscopy every 5 years or a colonoscopy every 10 years starting at age 34. Prostate cancer screening. Recommendations will vary depending on your family history and other risks. Hepatitis C blood test. Hepatitis B blood test. Sexually transmitted disease (STD) testing. Diabetes screening. This is done by checking your blood sugar (glucose) after you have not eaten for a while (fasting). You may have this done every 1-3 years. Abdominal aortic aneurysm (AAA) screening. You may need this if you are a current or former smoker. Osteoporosis. You may be screened starting at age 4 if you are at high risk. Talk with your health care provider about your test results, treatment options, and if necessary, the need for more tests. Vaccines  Your health care provider may recommend certain vaccines, such as: Influenza vaccine. This is recommended every  year. Tetanus, diphtheria, and acellular pertussis (Tdap, Td) vaccine. You may need a Td booster every 10 years. Zoster  vaccine. You may need this after age 69. Pneumococcal 13-valent conjugate (PCV13) vaccine. One dose is recommended after age 56. Pneumococcal polysaccharide (PPSV23) vaccine. One dose is recommended after age 33. Talk to your health care provider about which screenings and vaccines you need and how often you need them. This information is not intended to replace advice given to you by your health care provider. Make sure you discuss any questions you have with your health care provider. Document Released: 01/15/2015 Document Revised: 09/08/2015 Document Reviewed: 10/20/2014 Elsevier Interactive Patient Education  2017 [employer] Prevention in the Home Falls can cause injuries. They can happen to people of all ages. There are many things you can do to make your home safe and to help prevent falls. What can I do on the outside of my home? Regularly fix the edges of walkways and driveways and fix any cracks. Remove anything that might make you trip as you walk through a door, such as a raised step or threshold. Trim any bushes or trees on the path to your home. Use bright outdoor lighting. Clear any walking paths of anything that might make someone trip, such as rocks or tools. Regularly check to see if handrails are loose or broken. Make sure that both sides of any steps have handrails. Any raised decks and porches should have guardrails on the edges. Have any leaves, snow, or ice cleared regularly. Use sand or salt on walking paths during winter. Clean up any spills in your garage right away. This includes oil or grease spills. What can I do in the bathroom? Use night lights. Install grab bars by the toilet and in the tub and shower. Do not use towel bars as grab bars. Use non-skid mats or decals in the tub or shower. If you need to sit down in the shower, use a plastic, non-slip stool. Keep the floor dry. Clean up any water that spills on the floor as soon as it happens. Remove  soap buildup in the tub or shower regularly. Attach bath mats securely with double-sided non-slip rug tape. Do not have throw rugs and other things on the floor that can make you trip. What can I do in the bedroom? Use night lights. Make sure that you have a light by your bed that is easy to reach. Do not use any sheets or blankets that are too big for your bed. They should not hang down onto the floor. Have a firm chair that has side arms. You can use this for support while you get dressed. Do not have throw rugs and other things on the floor that can make you trip. What can I do in the kitchen? Clean up any spills right away. Avoid walking on wet floors. Keep items that you use a lot in easy-to-reach places. If you need to reach something above you, use a strong step stool that has a grab bar. Keep electrical cords out of the way. Do not use floor polish or wax that makes floors slippery. If you must use wax, use non-skid floor wax. Do not have throw rugs and other things on the floor that can make you trip. What can I do with my stairs? Do not leave any items on the stairs. Make sure that there are handrails on both sides of the stairs and use them. Fix handrails that are broken or  loose. Make sure that handrails are as long as the stairways. Check any carpeting to make sure that it is firmly attached to the stairs. Fix any carpet that is loose or worn. Avoid having throw rugs at the top or bottom of the stairs. If you do have throw rugs, attach them to the floor with carpet tape. Make sure that you have a light switch at the top of the stairs and the bottom of the stairs. If you do not have them, ask someone to add them for you. What else can I do to help prevent falls? Wear shoes that: Do not have high heels. Have rubber bottoms. Are comfortable and fit you well. Are closed at the toe. Do not wear sandals. If you use a stepladder: Make sure that it is fully opened. Do not climb a  closed stepladder. Make sure that both sides of the stepladder are locked into place. Ask someone to hold it for you, if possible. Clearly mark and make sure that you can see: Any grab bars or handrails. First and last steps. Where the edge of each step is. Use tools that help you move around (mobility aids) if they are needed. These include: Canes. Walkers. Scooters. Crutches. Turn on the lights when you go into a dark area. Replace any light bulbs as soon as they burn out. Set up your furniture so you have a clear path. Avoid moving your furniture around. If any of your floors are uneven, fix them. If there are any pets around you, be aware of where they are. Review your medicines with your doctor. Some medicines can make you feel dizzy. This can increase your chance of falling. Ask your doctor what other things that you can do to help prevent falls. This information is not intended to replace advice given to you by your health care provider. Make sure you discuss any questions you have with your health care provider. Document Released: 10/15/2008 Document Revised: 05/27/2015 Document Reviewed: 01/23/2014 Elsevier Interactive Patient Education  2017 [employer].

## 2021-01-07 NOTE — Progress Notes (Signed)
EPIC Encounter for ICM Monitoring  Patient Name: [name] is a 76 y.o. male Date: 01/07/2021 Primary Care Physican: [name], NP Primary Cardiologist: Irish Lack Electrophysiologist: Allred 01/07/2021 Weight:  210 lbs  Clinical Status (01-Nov-2020 to 05-Jan-2021) AF 171 Time in AF 0.6 hr/day (2.3%)         Spoke with patient and heart failure questions reviewed.  Pt asymptomatic for fluid accumulation.  Reports feeling well at this time and voices no complaints.    Opivol thoracic impedance suggesting possible fluid accumulation starting 11/25/2020.   Decreased impedance appears to correlate with the increase of AT/AF starting 10/2020.     No diuretic prescribed   Recommendations: No changes and encouraged to call if experiencing any fluid symptoms.   Follow-up plan: No further ICM appointments due to patient prefers to return to every 3 month device clinic monitoring.  91 day device clinic remote transmission 04/06/2021.     EP/Cardiology Office Visits: 04/27/2021 with Dr. Beau Fanny.  Recall 08/13/2021 with Oda Kilts, PA   Copy of ICM check sent to Dr. Rayann Heman and Dr Irish Lack for review and recommendations if needed.  3 month ICM trend: 01/05/2021.    12-14 Month ICM trend:     [name], RN 01/07/2021 4:37 PM

## 2021-01-10 ENCOUNTER — Telehealth: Payer: Self-pay | Admitting: *Deleted

## 2021-01-10 NOTE — Telephone Encounter (Signed)
PLEASE NOTE: All timestamps contained within this report are represented as [place] Standard Time. CONFIDENTIALTY NOTICE: This fax transmission is intended only for the addressee. It contains information that is legally privileged, confidential or otherwise protected from use or disclosure. If you are not the intended recipient, you are strictly prohibited from reviewing, disclosing, copying using or disseminating any of this information or taking any action in reliance on or regarding this information. If you have received this fax in error, please notify us immediately by telephone so that we can arrange for its return to [place]. Phone: [phone], Toll-Free: [phone], Fax: [phone] Page: 1 of 1 Call Id: [id number] [facility] - Client Nonclinical Telephone Record  [facility] Client [facility] - Client Client Site [place] - Day Provider Alma Friendly - NP Contact Type Call Who Is Calling Patient / Member / Family / Caregiver Caller Name Bryton Romagnoli Caller Phone Number [phone] Patient Name Baraka Klatt Patient DOB March 04, 1945 Call Type Message Only Information Provided Reason for Call Request for General Office Information Initial Comment Caller states he changed insurance and needs to provide updated information. Additional Comment Office hours provided and advised to follow up with the office. Disp. Time Disposition Final User 01/10/2021 8:10:44 AM General Information Provided Yes Gaspar Skeeters Call Closed By: Gaspar Skeeters Transaction Date/Time: 01/10/2021 8:07:20 AM (ET)

## 2021-01-10 NOTE — Telephone Encounter (Signed)
Called and new insurance was put in

## 2021-01-17 ENCOUNTER — Other Ambulatory Visit: Payer: Self-pay | Admitting: Primary Care

## 2021-01-17 DIAGNOSIS — N400 Enlarged prostate without lower urinary tract symptoms: Secondary | ICD-10-CM

## 2021-01-17 DIAGNOSIS — Z794 Long term (current) use of insulin: Secondary | ICD-10-CM

## 2021-01-17 DIAGNOSIS — E1159 Type 2 diabetes mellitus with other circulatory complications: Secondary | ICD-10-CM

## 2021-01-17 NOTE — Progress Notes (Signed)
Remote ICD transmission.   

## 2021-02-01 ENCOUNTER — Ambulatory Visit: Payer: Medicare Other | Admitting: Interventional Cardiology

## 2021-02-10 ENCOUNTER — Telehealth: Payer: Self-pay

## 2021-02-10 NOTE — Progress Notes (Signed)
° ° °  Chronic Care Management Pharmacy Assistant   Name: ASKARI KINLEY  MRN: [id number] DOB: [birth year]  Reason for Encounter: CCM [name] 2023 Approved)   Called [place] [employer] [place] to check on status of patient's Farxiga patient assistance application for [id number]. Patient has been approved through 01/01/2022. I called patient to inform him. Patient voiced understanding.   Debbora Dus, CPP notified  [name], [place] Clinical Pharmacy Assistant [phone]  Time Spent: 10 Minutes

## 2021-02-23 ENCOUNTER — Ambulatory Visit (INDEPENDENT_AMBULATORY_CARE_PROVIDER_SITE_OTHER): Payer: Medicare Other | Admitting: Primary Care

## 2021-02-23 ENCOUNTER — Encounter: Payer: Self-pay | Admitting: Primary Care

## 2021-02-23 ENCOUNTER — Other Ambulatory Visit: Payer: Self-pay

## 2021-02-23 VITALS — BP 130/62 | HR 64 | Temp 98.6°F | Ht 67.0 in | Wt 209.0 lb

## 2021-02-23 DIAGNOSIS — Z95810 Presence of automatic (implantable) cardiac defibrillator: Secondary | ICD-10-CM

## 2021-02-23 DIAGNOSIS — Z794 Long term (current) use of insulin: Secondary | ICD-10-CM

## 2021-02-23 DIAGNOSIS — E1122 Type 2 diabetes mellitus with diabetic chronic kidney disease: Secondary | ICD-10-CM

## 2021-02-23 DIAGNOSIS — N182 Chronic kidney disease, stage 2 (mild): Secondary | ICD-10-CM

## 2021-02-23 DIAGNOSIS — K7581 Nonalcoholic steatohepatitis (NASH): Secondary | ICD-10-CM

## 2021-02-23 DIAGNOSIS — E782 Mixed hyperlipidemia: Secondary | ICD-10-CM

## 2021-02-23 DIAGNOSIS — I480 Paroxysmal atrial fibrillation: Secondary | ICD-10-CM

## 2021-02-23 DIAGNOSIS — I10 Essential (primary) hypertension: Secondary | ICD-10-CM

## 2021-02-23 DIAGNOSIS — I25810 Atherosclerosis of coronary artery bypass graft(s) without angina pectoris: Secondary | ICD-10-CM

## 2021-02-23 DIAGNOSIS — N400 Enlarged prostate without lower urinary tract symptoms: Secondary | ICD-10-CM

## 2021-02-23 LAB — POCT GLYCOSYLATED HEMOGLOBIN (HGB A1C): Hemoglobin A1C: 7.8 % — AB (ref 4.0–5.6)

## 2021-02-23 NOTE — Progress Notes (Signed)
Subjective:    Patient ID: [name], male    DOB: February 23, 1945, 76 y.o.   MRN: [id number]  HPI  [name] is a very pleasant 76 y.o. male with a significant medical history of CAD, unstable angina, uncontrolled type 2 diabetes, NASH, IBS, hyperlipidemia, who presents today for follow up of chronic conditions.  1) Type 2 Diabetes:  Current medications include: Glipizide XL 10 mg daily, Novolin R 25 units TID with meals, Nololin N 55 units BID, Farxiga 10 mg daily   He is checking his blood glucose 3 times daily and is getting readings of:  AM fasting: 140-150 2 pm: 120's 2 hours after dinner: 150  A few low readings in the 80's-90's.   Last A1C: 7.5 in December 2022, 7.8 today.  Last Eye Exam: UTD Last Foot Exam: UTD Pneumonia Vaccination: 2018 Urine Microalbumin: None.  Statin: None. Zetia  Dietary changes since last visit: None.    Exercise: Active playing golf.   2) CAD/Cardiomyopathy/Hyperlipidemia/ICD/Atrial Fibrillation: Following with cardiology. Managed on lisinopril 5 mg daily, Zetia 10 mg daily, carvedilol 6.25 mg BID, isosorbide mononitrite 30 mg daily, Eliquis 5 mg BID.  Last office visit with cardiology was in October 2022, is scheduled for April 2023. Denies chest pain and firing of ICD.   3) BPH: Currently managed on tamsulosin 0.4 mg daily. Improved with tamsulosin per patient. Some weakness with urinary stream, but he is overall satisfied with his results.   4) Chronic Back Pain: Chronic to bilateral back with radiation of pain to bilateral extremities. Evaluated by Dr. Lorelei Pont, was provided with home exercises, xrays completed.   He will take Advil once daily when he plays golf with improvement. He is also managed on tizanidine, hasn't taken any of this.   Review of Systems  Eyes:  Negative for visual disturbance.  Respiratory:  Negative for shortness of breath.   Cardiovascular:  Negative for chest pain.  Gastrointestinal:  Negative for  constipation and diarrhea.  Genitourinary:  Negative for difficulty urinating.  Neurological:  Negative for dizziness, numbness and headaches.        Past Medical History:  Diagnosis Date   Arthritis    "fingers, shoulders" (08/03/2015)   Atrial fibrillation (HCC)    Paroxysmal, rare episodes, sinus rhythm on flecainide, patient prefers not to take Coumadin   Bradycardia    April, 2013   Chest pain    Nuclear, 2006, no ischemia   Chronic lower back pain    "all my life"   Coronary artery disease    s/p staged cath 05/12/2014 and 5/11, DES x 2 to heavily calcified RCA, residual with 60% prox LAD, 70% D1   Ejection fraction    EF 55-60%,  echo, January, 2011   Elevated CPK    CPK elevated with normal MB and normal troponin the past   Heart murmur    History of echocardiogram    Echo 8/16: EF 55%, normal wall motion, grade 2 diastolic dysfunction, trivial MR, normal RV function, PASP 27 mmHg   Hypertension    IBS (irritable bowel syndrome)    Myocardial infarction ([facility]) 06/2014   Sinus drainage    Type II diabetes mellitus ([facility])     Social History   Socioeconomic History   Marital status: Married    Spouse name: Not on file   Number of children: Not on file   Years of education: Not on file   Highest education level: Not on file  Occupational History  Not on file  Tobacco Use   Smoking status: Never   Smokeless tobacco: Never  Vaping Use   Vaping Use: Never used  Substance and Sexual Activity   Alcohol use: No   Drug use: No   Sexual activity: Not on file  Other Topics Concern   Not on file  Social History Narrative   Not on file   Social Determinants of Health   Financial Resource Strain: Low Risk    Difficulty of Paying Living Expenses: Not hard at all  Food Insecurity: No Food Insecurity   Worried About Charity fundraiser in the Last Year: Never true   [place] in the Last Year: Never true  Transportation Needs: No Transportation Needs   Lack  of Transportation (Medical): No   Lack of Transportation (Non-Medical): No  Physical Activity: Sufficiently Active   Days of Exercise per Week: 3 days   Minutes of Exercise per Session: 150+ min  Stress: No Stress Concern Present   Feeling of Stress : Not at all  Social Connections: Socially Integrated   Frequency of Communication with Friends and Family: More than three times a week   Frequency of Social Gatherings with Friends and Family: More than three times a week   Attends Religious Services: More than 4 times per year   Active Member of Clubs or Organizations: Yes   Attends Music therapist: More than 4 times per year   Marital Status: Married  Human resources officer Violence: Not At Risk   Fear of Current or Ex-Partner: No   Emotionally Abused: No   Physically Abused: No   Sexually Abused: No    Past Surgical History:  Procedure Laterality Date   APPENDECTOMY     CARDIAC CATHETERIZATION N/A 05/12/2014   Procedure: Left Heart Cath and Coronary Angiography;  Surgeon: Troy Sine, MD;  Location: [facility];  Service: Cardiovascular;  Laterality: N/A;   CARDIAC CATHETERIZATION N/A 05/13/2014   Procedure: Coronary/Graft Atherectomy;  Surgeon: Troy Sine, MD;  Location: [facility];  Service: Cardiovascular;  Laterality: N/A;   CARDIAC CATHETERIZATION Right 05/13/2014   Procedure: Temporary Pacemaker;  Surgeon: Troy Sine, MD;  Location: [facility];  Service: Cardiovascular;  Laterality: Right;   CARDIAC CATHETERIZATION N/A 05/13/2014   Procedure: Coronary Stent Intervention;  Surgeon: Troy Sine, MD;  Location: [facility];  Service: Cardiovascular;  Laterality: N/A;   CARDIAC CATHETERIZATION N/A 06/18/2014   Procedure: Left Heart Cath and Coronary Angiography;  Surgeon: Peter M Martinique, MD;  Location: [facility];  Service: Cardiovascular;  Laterality: N/A;   CARDIAC CATHETERIZATION N/A 08/03/2015   Procedure: Left Heart Cath  and Cors/Grafts Angiography;  Surgeon: Nelva Bush, MD;  Location: [facility];  Service: Cardiovascular;  Laterality: N/A;   CARDIOVASCULAR STRESS TEST  05/06/2014   CATARACT EXTRACTION W/ INTRAOCULAR LENS IMPLANT Left    COLONOSCOPY WITH PROPOFOL N/A 02/13/2020   Procedure: COLONOSCOPY WITH PROPOFOL;  Surgeon: Carol Ada, MD;  Location: [facility];  Service: Endoscopy;  Laterality: N/A;   CORONARY ARTERY BYPASS GRAFT N/A 06/22/2014   Procedure: CORONARY ARTERY BYPASS GRAFTING (CABG) x five, using left internal mammary artery, and right leg greater saphenous vein harvested endoscopically;  Surgeon: Melrose Nakayama, MD;  Location: [facility];  Service: Open Heart Surgery;  Laterality: N/A;   ICD IMPLANT N/A 01/07/2019   Procedure: ICD IMPLANT;  Surgeon: Thompson Grayer, MD;  [facility];  Service: Cardiovascular;  Laterality: N/A;   LAPAROSCOPIC CHOLECYSTECTOMY     LEFT HEART CATH AND CORS/GRAFTS ANGIOGRAPHY N/A 01/06/2019   Procedure: LEFT HEART CATH AND CORS/GRAFTS ANGIOGRAPHY;  Surgeon: Nelva Bush, MD;  Location: [facility];  Service: Cardiovascular;  Laterality: N/A;   MAZE N/A 06/22/2014   Procedure: MAZE;  Surgeon: Melrose Nakayama, MD;  Location: [facility];  Service: Open Heart Surgery;  Laterality: N/A;   POLYPECTOMY  02/13/2020   Procedure: POLYPECTOMY;  Surgeon: Carol Ada, MD;  Location: [facility];  Service: Endoscopy;;   TEE WITHOUT CARDIOVERSION N/A 06/22/2014   Procedure: TRANSESOPHAGEAL ECHOCARDIOGRAM (TEE);  Surgeon: Melrose Nakayama, MD;  Location: [facility];  Service: Open Heart Surgery;  Laterality: N/A;    Family History  Problem Relation Age of Onset   Alzheimer's disease Mother    Emphysema Father    Cancer Brother    Arrhythmia Sister    Heart attack Sister    Heart disease Sister    Hyperlipidemia Sister    Hypertension Sister     Allergies  Allergen Reactions   Lipitor [Atorvastatin] Other (See Comments)    Myopathy  Spring 2006   Pravastatin Other (See Comments)    LEG CRAMPS   Simvastatin Other (See Comments)    LEG CRAMPS    Current Outpatient Medications on File Prior to Visit  Medication Sig Dispense Refill   acetaminophen (TYLENOL) 325 MG tablet Take 1-2 tablets (325-650 mg total) by mouth every 4 (four) hours as needed for mild pain.     carvedilol (COREG) 6.25 MG tablet TAKE 1 TABLET BY MOUTH TWICE A DAY WITH MEAL 180 tablet 2   dapagliflozin propanediol (FARXIGA) 10 MG TABS tablet Take 1 tablet (10 mg total) by mouth daily before breakfast. For diabetes. 90 tablet 3   ELIQUIS 5 MG TABS tablet TAKE 1 TABLET BY MOUTH TWICE A DAY 60 tablet 5   ezetimibe (ZETIA) 10 MG tablet Take 1 tablet (10 mg total) by mouth daily. 30 tablet 11   insulin NPH Human (NOVOLIN N RELION) 100 UNIT/ML injection Inject 55 units into the skin every morning and 55 units into the skin in the evening. 30 mL 5   Insulin Pen Needle 31G X 8 MM MISC Use as directed with Lantus. 100 each 5   insulin regular (NOVOLIN R) 100 units/mL injection Inject 0.25 mLs (25 Units total) into the skin 3 (three) times daily before meals. (Patient taking differently: Inject 25 Units into the skin 3 (three) times daily before meals. Only doing twice a day) 60 mL 2   isosorbide mononitrate (IMDUR) 30 MG 24 hr tablet Take 1 tablet (30 mg total) by mouth daily. 90 tablet 2   lisinopril (ZESTRIL) 5 MG tablet TAKE 1 TABLET (5 MG TOTAL) BY MOUTH DAILY. PLEASE KEEP UPCOMING APPT FOR FUTURE REFILLS. 90 tablet 3   nitroGLYCERIN (NITROSTAT) 0.4 MG SL tablet Place 1 tablet (0.4 mg total) under the tongue every 5 (five) minutes as needed for chest pain. 25 tablet 6   OVER THE COUNTER MEDICATION Place 1 drop into both eyes 2 (two) times daily. liquid msm eye drops/ for floaters in the eyes     tamsulosin (FLOMAX) 0.4 MG CAPS capsule Take 1 capsule (0.4 mg total) by mouth daily. For urine flow. Office visit required for further refills. 90 capsule 0   tiZANidine  (ZANAFLEX) 4 MG tablet Take 0.5-1 tablets (2-4 mg total) by mouth at bedtime as needed  for muscle spasms. 30 tablet 2   glipiZIDE (GLUCOTROL XL) 10 MG 24 hr tablet Take 1 tablet (10 mg total) by mouth daily with breakfast. For diabetes. Office visit required for further refills. (Patient not taking: Reported on 02/23/2021) 90 tablet 0   No current facility-administered medications on file prior to visit.    BP 130/62    Pulse 64    Temp 98.6 F (37 C) (Oral)    Ht 5\' 7"  (1.702 m)    Wt 209 lb (94.8 kg)    SpO2 98%    BMI 32.73 kg/m  Objective:   Physical Exam Cardiovascular:     Rate and Rhythm: Normal rate and regular rhythm.  Pulmonary:     Effort: Pulmonary effort is normal.     Breath sounds: Normal breath sounds. No wheezing or rales.  Musculoskeletal:     Cervical back: Neck supple.  Skin:    General: Skin is warm and dry.  Neurological:     Mental Status: He is alert and oriented to person, place, and time.          Assessment & Plan:      This visit occurred during the SARS-CoV-2 public health emergency.  Safety protocols were in place, including screening questions prior to the visit, additional usage of staff PPE, and extensive cleaning of exam room while observing appropriate contact time as indicated for disinfecting solutions.

## 2021-02-23 NOTE — Assessment & Plan Note (Signed)
Controlled. Reviewed lipid panel from December 2020.  Continue Zetia 10 mg daily.

## 2021-02-23 NOTE — Assessment & Plan Note (Signed)
Asymptomatic.  Continue carvedilol 6.25 mg twice daily, isosorbide mononitrate 30 mg daily.  Continue blood pressure, cholesterol control.  Would like to see A1c less than 7, but his A1c has improved over the last 6 months.  Strongly advised to work on his diet.

## 2021-02-23 NOTE — Patient Instructions (Signed)
It was a pleasure to see you today!   

## 2021-02-23 NOTE — Assessment & Plan Note (Signed)
>>  ASSESSMENT AND PLAN FOR CAD (CORONARY ARTERY DISEASE) OF ARTERY BYPASS GRAFT WRITTEN ON 02/23/2021  9:19 AM BY CLARK, KATHERINE K, NP  Asymptomatic.  Continue carvedilol  6.25 mg twice daily, isosorbide  mononitrate 30 mg daily.  Continue blood pressure, cholesterol control.  Would like to see A1c less than 7, but his A1c has improved over the last 6 months.  Strongly advised to work on his diet.

## 2021-02-23 NOTE — Assessment & Plan Note (Signed)
Controlled.  Continue carvedilol 6.25 mg twice daily, lisinopril 5 mg daily.  CMP reviewed from December 2022.

## 2021-02-23 NOTE — Assessment & Plan Note (Signed)
Reviewed interrogation from January 2023.

## 2021-02-23 NOTE — Assessment & Plan Note (Addendum)
Improved overall compared to the last several years.  A1c today of 7.8.  Would like to see A1c below 7, discussed this with patient today.  I am reticent to adjust his medication regimen given his overall frailty and lower glucose readings.  He assures me that he will work on his diet.  Continue Novolin and 55 units twice daily, Novolin R 25 units to 3 times daily with meals, Farxiga 10 mg daily.   Follow-up in 3 months.

## 2021-02-23 NOTE — Assessment & Plan Note (Signed)
Elevated liver enzymes reviewed from December 2022.  Recommended we recheck enzymes today, he declines.  Ultrasound reviewed from December 2022 which reveals fatty liver.  Repeat liver enzymes at next visit

## 2021-02-23 NOTE — Assessment & Plan Note (Signed)
Rate and rhythm regular today. Following with cardiology and electrophysiology.  Continue Eliquis 5 mg twice daily, carvedilol 6.25 mg twice daily.

## 2021-02-23 NOTE — Assessment & Plan Note (Signed)
Controlled.  Continue tamsulosin 0.4 mg daily. 

## 2021-03-18 ENCOUNTER — Telehealth: Payer: Self-pay

## 2021-03-18 NOTE — Progress Notes (Signed)
? ? ?  Chronic Care Management ?Pharmacy Assistant  ? ?Name: [name]  MRN: [id number] DOB: Sep 06, 1945 ? ?Reason for Encounter: CCM Counsellor) ?  ?Medications: ?Outpatient Encounter Medications as of 03/18/2021  ?Medication Sig  ? acetaminophen (TYLENOL) 325 MG tablet Take 1-2 tablets (325-650 mg total) by mouth every 4 (four) hours as needed for mild pain.  ? carvedilol (COREG) 6.25 MG tablet TAKE 1 TABLET BY MOUTH TWICE A DAY WITH MEAL  ? dapagliflozin propanediol (FARXIGA) 10 MG TABS tablet Take 1 tablet (10 mg total) by mouth daily before breakfast. For diabetes.  ? ELIQUIS 5 MG TABS tablet TAKE 1 TABLET BY MOUTH TWICE A DAY  ? ezetimibe (ZETIA) 10 MG tablet Take 1 tablet (10 mg total) by mouth daily.  ? glipiZIDE (GLUCOTROL XL) 10 MG 24 hr tablet Take 1 tablet (10 mg total) by mouth daily with breakfast. For diabetes. Office visit required for further refills. (Patient not taking: Reported on 02/23/2021)  ? insulin NPH Human (NOVOLIN N RELION) 100 UNIT/ML injection Inject 55 units into the skin every morning and 55 units into the skin in the evening.  ? Insulin Pen Needle 31G X 8 MM MISC Use as directed with Lantus.  ? insulin regular (NOVOLIN R) 100 units/mL injection Inject 0.25 mLs (25 Units total) into the skin 3 (three) times daily before meals. (Patient taking differently: Inject 25 Units into the skin 3 (three) times daily before meals. Only doing twice a day)  ? isosorbide mononitrate (IMDUR) 30 MG 24 hr tablet Take 1 tablet (30 mg total) by mouth daily.  ? lisinopril (ZESTRIL) 5 MG tablet TAKE 1 TABLET (5 MG TOTAL) BY MOUTH DAILY. PLEASE KEEP UPCOMING APPT FOR FUTURE REFILLS.  ? nitroGLYCERIN (NITROSTAT) 0.4 MG SL tablet Place 1 tablet (0.4 mg total) under the tongue every 5 (five) minutes as needed for chest pain.  ? OVER THE COUNTER MEDICATION Place 1 drop into both eyes 2 (two) times daily. liquid msm eye drops/ for floaters in the eyes  ? tamsulosin (FLOMAX) 0.4 MG CAPS capsule Take 1  capsule (0.4 mg total) by mouth daily. For urine flow. Office visit required for further refills.  ? tiZANidine (ZANAFLEX) 4 MG tablet Take 0.5-1 tablets (2-4 mg total) by mouth at bedtime as needed for muscle spasms.  ? ?No facility-administered encounter medications on file as of 03/18/2021.  ? ?[name] was contacted to remind of upcoming telephone visit with [name] on 03/23/2021 at 11:00. Patient was reminded to have any blood glucose and blood pressure readings available for review at appointment. If unable to reach, a voicemail was left for patient.  ? ?Star Rating Drugs: ?Medication:  Last Fill: Day Supply ?Glipizide 10 mg 01/17/2021 90 ?Lisinopril 5 mg  01/01/2021 90 ? ?[name], CPP notified ? ?[name], RMA ?Clinical Pharmacy Assistant ?[phone] ? ?  ? ? ? ?

## 2021-03-22 ENCOUNTER — Telehealth: Payer: Medicare Other

## 2021-03-23 ENCOUNTER — Telehealth: Payer: Medicare Other

## 2021-04-06 ENCOUNTER — Ambulatory Visit (INDEPENDENT_AMBULATORY_CARE_PROVIDER_SITE_OTHER): Payer: Medicare Other

## 2021-04-06 DIAGNOSIS — I428 Other cardiomyopathies: Secondary | ICD-10-CM

## 2021-04-06 LAB — CUP PACEART REMOTE DEVICE CHECK
Battery Remaining Longevity: 122 mo
Battery Voltage: 3.01 V
Brady Statistic RV Percent Paced: 0.01 %
Date Time Interrogation Session: 20230405001602
HighPow Impedance: 73 Ohm
Implantable Lead Implant Date: 20210105
Implantable Lead Location: 753860
Implantable Pulse Generator Implant Date: 20210105
Lead Channel Impedance Value: 380 Ohm
Lead Channel Impedance Value: 437 Ohm
Lead Channel Pacing Threshold Amplitude: 0.5 V
Lead Channel Pacing Threshold Pulse Width: 0.4 ms
Lead Channel Sensing Intrinsic Amplitude: 10.5 mV
Lead Channel Sensing Intrinsic Amplitude: 10.5 mV
Lead Channel Setting Pacing Amplitude: 2.5 V
Lead Channel Setting Pacing Pulse Width: 0.4 ms
Lead Channel Setting Sensing Sensitivity: 0.3 mV

## 2021-04-10 ENCOUNTER — Other Ambulatory Visit: Payer: Self-pay | Admitting: Primary Care

## 2021-04-10 DIAGNOSIS — N400 Enlarged prostate without lower urinary tract symptoms: Secondary | ICD-10-CM

## 2021-04-10 DIAGNOSIS — E1159 Type 2 diabetes mellitus with other circulatory complications: Secondary | ICD-10-CM

## 2021-04-21 NOTE — Progress Notes (Signed)
Remote ICD transmission.   

## 2021-04-25 NOTE — Progress Notes (Signed)
?  ?Cardiology Office Note ? ? ?Date:  04/27/2021  ? ?ID:  [name], DOB 05-21-1945, MRN [id number] ? ?PCP:  [name], NP  ? ? ?Chief Complaint  ?Patient presents with  ? Follow-up  ? ?CAD ? ?Wt Readings from Last 3 Encounters:  ?04/27/21 210 lb (95.3 kg)  ?02/23/21 209 lb (94.8 kg)  ?01/07/21 212 lb (96.2 kg)  ?  ? ?  ?History of Present Illness: ?[name] is a 76 y.o. male  who presents today to follow-up coronary disease.  He had stents in 5/16 with rotational atherectomy.  He then had early restenosis and progression of left sided disease. He had surgery with Dr. Roxan Hockey.  He is post-CABG; after having surgery in June 2016.  He has some toe numbness intermittently. His surgery was June 22, 2014. In addition he had a maze procedure and a left atrial appendage clip was placed. He remains on anticoagulation. During his initial hospitalization his EF was in the 40-45% range.  EF had normalized at 55%. ?  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.  He had palpitations with his prior atrial fibrillation. He did have left atrial appendage clipping during his open heart surgery. ?  ?He had a NSTEMI in Augist 2017.  EF by cath was low.  EF by echo was 50-55%. SVG to RCA occluded but medically managed.   ?  ?He had syncope in Jan 2021.  EF has decreased to EF 35-40%.  Cath was done: ?"Severe native coronary artery disease, as detailed below, not significant changed from prior catheterization in 2017. ?Widely patent LIMA-LAD. ?Patent but small and diffusely diseased SVG-rPDA-rPL, with a focal 90% stenosis just before the SVG-rPDA anastomosis.  Graft appearance is similar to prior catheterization in 2017. ?Chronically occluded SVG-ramus-D1. ?Mildly elevated left ventricular filling pressure. ?  ?Recommendations: ?Given stable appearance of coronary artery disease and negligible troponin, I do not believe that worsening CAD is driving his dizziness/syncope.  Further evaluation for potential  arrhythmic cause is recommended. ?Continue medical therapy and aggressive secondary prevention. ?Restart apixaban tomorrow if no evidence of bleeding/vascular complications." ?  ?AICD was placed:" Implantation of a Medtronic single chamber ICD on 01/07/2019 by Dr. Rayann Heman.  The patient received a Medtronic model number [id number] ICD with model number [id number] right ventricular lead. DFT's were deferred at time of implant." ? ?Played golf for exercise.  ? ?Stopped Zetia due to body aches in Jan 2023.  Feels much better. ? ?[name] has brought down A1C from >9 to 7.8. ? ?Denies : Chest pain. Dizziness. Leg edema. Nitroglycerin use. Orthopnea. Palpitations. Paroxysmal nocturnal dyspnea. Shortness of breath. Syncope.   ? ? ?Past Medical History:  ?Diagnosis Date  ? Arthritis   ? "fingers, shoulders" (08/03/2015)  ? Atrial fibrillation ([place])   ? Paroxysmal, rare episodes, sinus rhythm on flecainide, patient prefers not to take Coumadin  ? Bradycardia   ? April, 2013  ? Chest pain   ? Nuclear, 2006, no ischemia  ? Chronic lower back pain   ? "all my life"  ? Coronary artery disease   ? s/p staged cath 05/12/2014 and 5/11, DES x 2 to heavily calcified RCA, residual with 60% prox LAD, 70% D1  ? Ejection fraction   ? EF 55-60%,  echo, January, 2011  ? Elevated CPK   ? CPK elevated with normal MB and normal troponin the past  ? Heart murmur   ? History of echocardiogram   ? Echo 8/16:  EF 55%, normal wall motion, grade 2 diastolic dysfunction, trivial MR, normal RV function, PASP 27 mmHg  ? Hypertension   ? IBS (irritable bowel syndrome)   ? Myocardial infarction ([place]) 06/2014  ? Sinus drainage   ? Type II diabetes mellitus ([place])   ? ? ?Past Surgical History:  ?Procedure Laterality Date  ? APPENDECTOMY    ? CARDIAC CATHETERIZATION N/A 05/12/2014  ? Procedure: Left Heart Cath and Coronary Angiography;  Surgeon: Troy Sine, MD;  Location: [facility];  Service: Cardiovascular;  Laterality: N/A;  ? CARDIAC CATHETERIZATION N/A  05/13/2014  ? Procedure: Coronary/Graft Atherectomy;  Surgeon: Troy Sine, MD;  Location: [facility];  Service: Cardiovascular;  Laterality: N/A;  ? CARDIAC CATHETERIZATION Right 05/13/2014  ? Procedure: Temporary Pacemaker;  Surgeon: Troy Sine, MD;  Location: [facility];  Service: Cardiovascular;  Laterality: Right;  ? CARDIAC CATHETERIZATION N/A 05/13/2014  ? Procedure: Coronary Stent Intervention;  Surgeon: Troy Sine, MD;  Location: [facility];  Service: Cardiovascular;  Laterality: N/A;  ? CARDIAC CATHETERIZATION N/A 06/18/2014  ? Procedure: Left Heart Cath and Coronary Angiography;  Surgeon: Peter M Martinique, MD;  Location: [facility];  Service: Cardiovascular;  Laterality: N/A;  ? CARDIAC CATHETERIZATION N/A 08/03/2015  ? Procedure: Left Heart Cath and Cors/Grafts Angiography;  Surgeon: Nelva Bush, MD;  Location: [facility];  Service: Cardiovascular;  Laterality: N/A;  ? CARDIOVASCULAR STRESS TEST  05/06/2014  ? CATARACT EXTRACTION W/ INTRAOCULAR LENS IMPLANT Left   ? COLONOSCOPY WITH PROPOFOL N/A 02/13/2020  ? Procedure: COLONOSCOPY WITH PROPOFOL;  Surgeon: Carol Ada, MD;  Location: [facility];  Service: Endoscopy;  Laterality: N/A;  ? CORONARY ARTERY BYPASS GRAFT N/A 06/22/2014  ? Procedure: CORONARY ARTERY BYPASS GRAFTING (CABG) x five, using left internal mammary artery, and right leg greater saphenous vein harvested endoscopically;  Surgeon: Melrose Nakayama, MD;  Location: [facility];  Service: Open Heart Surgery;  Laterality: N/A;  ? ICD IMPLANT N/A 01/07/2019  ? Procedure: ICD IMPLANT;  Surgeon: Thompson Grayer, MD;  Location: [facility];  Service: Cardiovascular;  Laterality: N/A;  ? LAPAROSCOPIC CHOLECYSTECTOMY    ? LEFT HEART CATH AND CORS/GRAFTS ANGIOGRAPHY N/A 01/06/2019  ? Procedure: LEFT HEART CATH AND CORS/GRAFTS ANGIOGRAPHY;  Surgeon: Nelva Bush, MD;  Location: [facility];  Service: Cardiovascular;  Laterality: N/A;  ? MAZE  N/A 06/22/2014  ? Procedure: MAZE;  Surgeon: Melrose Nakayama, MD;  Location: [facility];  Service: Open Heart Surgery;  Laterality: N/A;  ? POLYPECTOMY  02/13/2020  ? Procedure: POLYPECTOMY;  Surgeon: Carol Ada, MD;  Location: [facility];  Service: Endoscopy;;  ? TEE WITHOUT CARDIOVERSION N/A 06/22/2014  ? Procedure: TRANSESOPHAGEAL ECHOCARDIOGRAM (TEE);  Surgeon: Melrose Nakayama, MD;  Location: [facility];  Service: Open Heart Surgery;  Laterality: N/A;  ? ? ? ?Current Outpatient Medications  ?Medication Sig Dispense Refill  ? acetaminophen (TYLENOL) 325 MG tablet Take 1-2 tablets (325-650 mg total) by mouth every 4 (four) hours as needed for mild pain.    ? carvedilol (COREG) 6.25 MG tablet TAKE 1 TABLET BY MOUTH TWICE A DAY WITH MEAL 180 tablet 2  ? dapagliflozin propanediol (FARXIGA) 10 MG TABS tablet Take 1 tablet (10 mg total) by mouth daily before breakfast. For diabetes. 90 tablet 3  ? ELIQUIS 5 MG TABS tablet TAKE 1 TABLET BY MOUTH TWICE A DAY 60 tablet 5  ? glipiZIDE (GLUCOTROL XL) 10  MG 24 hr tablet Take 1 tablet (10 mg total) by mouth daily with breakfast. for diabetes. 90 tablet 1  ? insulin NPH Human (NOVOLIN N RELION) 100 UNIT/ML injection Inject 55 units into the skin every morning and 55 units into the skin in the evening. 30 mL 5  ? Insulin Pen Needle 31G X 8 MM MISC Use as directed with Lantus. 100 each 5  ? insulin regular (NOVOLIN R) 100 units/mL injection Inject 0.25 mLs (25 Units total) into the skin 3 (three) times daily before meals. (Patient taking differently: Inject 25 Units into the skin 3 (three) times daily before meals. Only doing twice a day) 60 mL 2  ? isosorbide mononitrate (IMDUR) 30 MG 24 hr tablet Take 1 tablet (30 mg total) by mouth daily. 90 tablet 2  ? lisinopril (ZESTRIL) 5 MG tablet TAKE 1 TABLET (5 MG TOTAL) BY MOUTH DAILY. PLEASE KEEP UPCOMING APPT FOR FUTURE REFILLS. 90 tablet 3  ? nitroGLYCERIN (NITROSTAT) 0.4 MG SL tablet Place 1 tablet (0.4 mg total) under the  tongue every 5 (five) minutes as needed for chest pain. 25 tablet 6  ? OVER THE COUNTER MEDICATION Place 1 drop into both eyes 2 (two) times daily. liquid msm eye drops/ for floaters in the eyes    ? tamsulo

## 2021-04-27 ENCOUNTER — Telehealth: Payer: Self-pay | Admitting: Pharmacist

## 2021-04-27 ENCOUNTER — Encounter: Payer: Self-pay | Admitting: Interventional Cardiology

## 2021-04-27 ENCOUNTER — Ambulatory Visit: Payer: Medicare Other | Admitting: Interventional Cardiology

## 2021-04-27 VITALS — BP 100/54 | HR 60 | Ht 68.0 in | Wt 210.0 lb

## 2021-04-27 DIAGNOSIS — I471 Supraventricular tachycardia: Secondary | ICD-10-CM

## 2021-04-27 DIAGNOSIS — Z95810 Presence of automatic (implantable) cardiac defibrillator: Secondary | ICD-10-CM

## 2021-04-27 DIAGNOSIS — I480 Paroxysmal atrial fibrillation: Secondary | ICD-10-CM | POA: Diagnosis not present

## 2021-04-27 DIAGNOSIS — E782 Mixed hyperlipidemia: Secondary | ICD-10-CM | POA: Diagnosis not present

## 2021-04-27 DIAGNOSIS — I5022 Chronic systolic (congestive) heart failure: Secondary | ICD-10-CM | POA: Diagnosis not present

## 2021-04-27 DIAGNOSIS — E1159 Type 2 diabetes mellitus with other circulatory complications: Secondary | ICD-10-CM

## 2021-04-27 DIAGNOSIS — I25810 Atherosclerosis of coronary artery bypass graft(s) without angina pectoris: Secondary | ICD-10-CM | POA: Diagnosis not present

## 2021-04-27 NOTE — Telephone Encounter (Signed)
Received message from Dr Irish Lack regarding lipids. He stopped taking Zetia due to side effects. Is already intolerant to pravastatin 20-40mg daily, rosuvastatin 53m daily and 3x per week, simvastatin 175m3x per week, and atorvastatin. ? ?Discussed PCSK9i with pt. He remains hesitant to try therapy due to cost and since he already takes injections and doesn't wish to add more. Advised pt to call clinic if he wishes to start PCSK9i. ?

## 2021-04-27 NOTE — Patient Instructions (Addendum)
Medication Instructions:  ?Your physician recommends that you continue on your current medications as directed. Please refer to the Current Medication list given to you today. ? ?*If you need a refill on your cardiac medications before your next appointment, please call your pharmacy* ? ? ?Lab Work: ?Your physician recommends that you return for lab work on May 04, 2021--CMET, CBC, Lipids.  This will be fasting.  The lab opens at 7:15 AM ? ?If you have labs (blood work) drawn today and your tests are completely normal, you will receive your results only by: ?MyChart Message (if you have MyChart) OR ?A paper copy in the mail ?If you have any lab test that is abnormal or we need to change your treatment, we will call you to review the results. ? ? ?Testing/Procedures: ?none ? ? ?Follow-Up: ?At [facility], you and your health needs are our priority.  As part of our continuing mission to provide you with exceptional heart care, we have created designated Provider Care Teams.  These Care Teams include your primary Cardiologist (physician) and Advanced Practice Providers (APPs -  Physician Assistants and Nurse Practitioners) who all work together to provide you with the care you need, when you need it. ? ?We recommend signing up for the patient portal called "MyChart".  Sign up information is provided on this After Visit Summary.  MyChart is used to connect with patients for Virtual Visits (Telemedicine).  Patients are able to view lab/test results, encounter notes, upcoming appointments, etc.  Non-urgent messages can be sent to your provider as well.   ?To learn more about what you can do with MyChart, go to [email or web address].   ? ?Your next appointment:   ?12 month(s) ? ?The format for your next appointment:   ?In Person ? ?Provider:   ?Larae Grooms, MD   ? ? ?Other Instructions ?  ? ?Important Information About Sugar ? ? ? ? ? ? ?

## 2021-04-27 NOTE — Addendum Note (Signed)
Addended by: Thompson Grayer on: 04/27/2021 09:20 AM ? ? Modules accepted: Orders ? ?

## 2021-05-04 ENCOUNTER — Other Ambulatory Visit: Payer: Medicare Other | Admitting: *Deleted

## 2021-05-04 DIAGNOSIS — I480 Paroxysmal atrial fibrillation: Secondary | ICD-10-CM | POA: Diagnosis not present

## 2021-05-04 DIAGNOSIS — I25810 Atherosclerosis of coronary artery bypass graft(s) without angina pectoris: Secondary | ICD-10-CM

## 2021-05-04 DIAGNOSIS — I5022 Chronic systolic (congestive) heart failure: Secondary | ICD-10-CM | POA: Diagnosis not present

## 2021-05-04 DIAGNOSIS — I471 Supraventricular tachycardia, unspecified: Secondary | ICD-10-CM

## 2021-05-04 DIAGNOSIS — E782 Mixed hyperlipidemia: Secondary | ICD-10-CM | POA: Diagnosis not present

## 2021-05-04 LAB — CBC
Hematocrit: 42.1 % (ref 37.5–51.0)
Hemoglobin: 13.9 g/dL (ref 13.0–17.7)
MCH: 29 pg (ref 26.6–33.0)
MCHC: 33 g/dL (ref 31.5–35.7)
MCV: 88 fL (ref 79–97)
Platelets: 193 10*3/uL (ref 150–450)
RBC: 4.79 x10E6/uL (ref 4.14–5.80)
RDW: 14.3 % (ref 11.6–15.4)
WBC: 7 10*3/uL (ref 3.4–10.8)

## 2021-05-04 LAB — COMPREHENSIVE METABOLIC PANEL
ALT: 42 IU/L (ref 0–44)
AST: 33 IU/L (ref 0–40)
Albumin/Globulin Ratio: 1.7 (ref 1.2–2.2)
Albumin: 4.4 g/dL (ref 3.7–4.7)
Alkaline Phosphatase: 107 IU/L (ref 44–121)
BUN/Creatinine Ratio: 19 (ref 10–24)
BUN: 21 mg/dL (ref 8–27)
Bilirubin Total: 1 mg/dL (ref 0.0–1.2)
CO2: 22 mmol/L (ref 20–29)
Calcium: 9.3 mg/dL (ref 8.6–10.2)
Chloride: 105 mmol/L (ref 96–106)
Creatinine, Ser: 1.08 mg/dL (ref 0.76–1.27)
Globulin, Total: 2.6 g/dL (ref 1.5–4.5)
Glucose: 73 mg/dL (ref 70–99)
Potassium: 4.1 mmol/L (ref 3.5–5.2)
Sodium: 139 mmol/L (ref 134–144)
Total Protein: 7 g/dL (ref 6.0–8.5)
eGFR: 72 mL/min/{1.73_m2} (ref >59)

## 2021-05-04 LAB — LIPID PANEL
Chol/HDL Ratio: 4 ratio (ref 0.0–5.0)
Cholesterol, Total: 176 mg/dL (ref 100–199)
HDL: 44 mg/dL (ref >39)
LDL Chol Calc (NIH): 125 mg/dL — ABNORMAL HIGH (ref 0–99)
Triglycerides: 35 mg/dL (ref 0–149)
VLDL Cholesterol Cal: 7 mg/dL (ref 5–40)

## 2021-05-10 NOTE — Telephone Encounter (Signed)
Spoke to patient by telephone and was advised that he is feeling fine now.Patient stated that his back hurts but that has been hurting for a while. Patient denies, chest pain, SOB, dizziness or any others symptoms at this time. Patient stated that he did pass out while playing golf. Patient stated that he thinks that his blood sugar dropped quickly. Patient stated that he has a machine that checks his heart rate and oxygen level and all of that checked out after he passed out. Patient scheduled for an appointment to see [name] NP tomorrow 05/11/21 at 9:00 am. Patient was given ER precautions and he verbalized understanding. ?

## 2021-05-10 NOTE — Telephone Encounter (Signed)
Noted, will evaluate as scheduled.  ?

## 2021-05-11 ENCOUNTER — Encounter: Payer: Self-pay | Admitting: Primary Care

## 2021-05-11 ENCOUNTER — Ambulatory Visit (INDEPENDENT_AMBULATORY_CARE_PROVIDER_SITE_OTHER): Payer: Medicare Other | Admitting: Primary Care

## 2021-05-11 ENCOUNTER — Other Ambulatory Visit: Payer: Self-pay | Admitting: Primary Care

## 2021-05-11 VITALS — BP 134/68 | HR 90 | Temp 97.9°F | Ht 68.0 in | Wt 216.0 lb

## 2021-05-11 DIAGNOSIS — E1159 Type 2 diabetes mellitus with other circulatory complications: Secondary | ICD-10-CM

## 2021-05-11 DIAGNOSIS — E160 Drug-induced hypoglycemia without coma: Secondary | ICD-10-CM | POA: Diagnosis not present

## 2021-05-11 DIAGNOSIS — Z794 Long term (current) use of insulin: Secondary | ICD-10-CM

## 2021-05-11 DIAGNOSIS — T383X5A Adverse effect of insulin and oral hypoglycemic [antidiabetic] drugs, initial encounter: Secondary | ICD-10-CM | POA: Diagnosis not present

## 2021-05-11 DIAGNOSIS — R55 Syncope and collapse: Secondary | ICD-10-CM

## 2021-05-11 MED ORDER — FREESTYLE LIBRE 2 SENSOR MISC: 1 refills | Status: DC

## 2021-05-11 MED ORDER — FREESTYLE LIBRE 2 READER DEVI: 0 refills | Status: DC

## 2021-05-11 MED ORDER — INSULIN NPH (HUMAN) (ISOPHANE) 100 UNIT/ML ~~LOC~~ SUSP: SUBCUTANEOUS | 5 refills | Status: DC

## 2021-05-11 MED ORDER — OZEMPIC (0.25 OR 0.5 MG/DOSE) 2 MG/1.5ML ~~LOC~~ SOPN: PEN_INJECTOR | SUBCUTANEOUS | 1 refills | Status: DC

## 2021-05-11 MED ORDER — INSULIN REGULAR HUMAN 100 UNIT/ML IJ SOLN: INTRAMUSCULAR | 2 refills | Status: DC

## 2021-05-11 NOTE — Assessment & Plan Note (Signed)
Recent syncopal episode likely secondary to hypoglycemia.  Evaluated by cardiology last week. ? ?Continue off glipizide XL 10 mg daily. ? ?Continue Farxiga 10 mg daily. ? ?Start Ozempic 0.25 mg weekly x4 weeks, then increase to 0.5 mg thereafter.  We will work on patient assistance paperwork if needed. ? ?Reduce evening NovoLog to 15 units, reduce evening NPH to 45 units.  ? ?Continue NPH 55 units in the morning, NovoLog 25 units in the morning and afternoon. ? ?Rx for freestyle libre 2 sent to pharmacy, sample provided today.   ? ?We will plan to see him back in 2 weeks for follow-up, glucose log handouts provided. ? ?He will call sooner if he continues to experience hypoglycemic episodes ?

## 2021-05-11 NOTE — Assessment & Plan Note (Signed)
Likely secondary to hypoglycemic episode, especially given his normal report 1 week prior from cardiology. ? ?Reviewed labs from last week per cardiology. ? ?We will work to tweak/reduce insulin.  Remain off of glipizide. ? ?We will plan to see him back in 2 weeks for follow-up. ?

## 2021-05-11 NOTE — Progress Notes (Signed)
? ?Subjective:  ? ? Patient ID: [name], male    DOB: June 20, 1945, 76 y.o.   MRN: [id number] ? ?HPI ? ?[name] is a very pleasant 76 y.o. male with a history of CAD with CABG, hypertension, paroxysmal atrial fibrillation, NASH, uncontrolled type 2 diabetes, BPH, hyperlipidemia, who presents today for follow up from syncopal episode. ? ?He sent a message via [facility] on 05/09/2021 with reports of witnessed syncopal episode while at a golf tournament 2 days prior.  Prior to the episode he felt "shaky" and passed out for a few seconds.  Shortly after he checked his heart rhythm via his watch which was "normal", oxygen level was 98%, blood sugar was 140 at this time. Our nurse called him the following day who reported him to be "feeling fine now". He was asked to come in today. ? ?Today he endorses he was caught before falling, lowered to the ground. He checked his glucose earlier that morning which was 140 before breakfast. He ate breakfast and took all morning medications. He did not check his glucose just after the incident, but ate candy for several hours. He was able to finish an additional 16 holes of golf that day, but felt shaky the entire time. This is how he feels when his glucose levels drop. His glucose drops in the 60's at least 2-3 times weekly.  ? ?He's checking his blood glucose several times daily and is getting readings of 60's-170's, mostly in the low 100's. He is compliant to his NPH 55 units BID, Novolog 25 units TID, Farxiga 10 mg, Glipizide XL 10 mg. He does not have a continuous glucose monitor. His last dose of Glipizide XL 10 mg was 10 days ago.  ? ?He endorses a poor diet. He was evaluated by cardiology last week, underwent lab work, was told "my heart checked out good". He denies chest pain, shortness of breath.  ? ?Review of Systems  ?Respiratory:  Negative for shortness of breath.   ?Cardiovascular:  Negative for chest pain.  ?Neurological:  Positive for dizziness and syncope.  Negative for headaches.  ? ?   ? ? ?Past Medical History:  ?Diagnosis Date  ? Arthritis   ? "fingers, shoulders" (08/03/2015)  ? Atrial fibrillation ([place])   ? Paroxysmal, rare episodes, sinus rhythm on flecainide, patient prefers not to take Coumadin  ? Bradycardia   ? April, 2013  ? Chest pain   ? Nuclear, 2006, no ischemia  ? Chronic lower back pain   ? "all my life"  ? Coronary artery disease   ? s/p staged cath 05/12/2014 and 5/11, DES x 2 to heavily calcified RCA, residual with 60% prox LAD, 70% D1  ? Ejection fraction   ? EF 55-60%,  echo, January, 2011  ? Elevated CPK   ? CPK elevated with normal MB and normal troponin the past  ? Heart murmur   ? History of echocardiogram   ? Echo 8/16: EF 55%, normal wall motion, grade 2 diastolic dysfunction, trivial MR, normal RV function, PASP 27 mmHg  ? Hypertension   ? IBS (irritable bowel syndrome)   ? Myocardial infarction ([place]) 06/2014  ? Sinus drainage   ? Type II diabetes mellitus ([place])   ? ? ?Social History  ? ?Socioeconomic History  ? Marital status: Married  ?  Spouse name: Not on file  ? Number of children: Not on file  ? Years of education: Not on file  ? Highest education level: Not on  file  ?Occupational History  ? Not on file  ?Tobacco Use  ? Smoking status: Never  ? Smokeless tobacco: Never  ?Vaping Use  ? Vaping Use: Never used  ?Substance and Sexual Activity  ? Alcohol use: No  ? Drug use: No  ? Sexual activity: Not on file  ?Other Topics Concern  ? Not on file  ?Social History Narrative  ? Not on file  ? ?Social Determinants of Health  ? ?Financial Resource Strain: Low Risk   ? Difficulty of Paying Living Expenses: Not hard at all  ?Food Insecurity: No Food Insecurity  ? Worried About Charity fundraiser in the Last Year: Never true  ? Ran Out of Food in the Last Year: Never true  ?Transportation Needs: No Transportation Needs  ? Lack of Transportation (Medical): No  ? Lack of Transportation (Non-Medical): No  ?Physical Activity: Sufficiently Active  ?  Days of Exercise per Week: 3 days  ? Minutes of Exercise per Session: 150+ min  ?Stress: No Stress Concern Present  ? Feeling of Stress : Not at all  ?Social Connections: Socially Integrated  ? Frequency of Communication with Friends and Family: More than three times a week  ? Frequency of Social Gatherings with Friends and Family: More than three times a week  ? Attends Religious Services: More than 4 times per year  ? Active Member of Clubs or Organizations: Yes  ? Attends Archivist Meetings: More than 4 times per year  ? Marital Status: Married  ?Intimate Partner Violence: Not At Risk  ? Fear of Current or Ex-Partner: No  ? Emotionally Abused: No  ? Physically Abused: No  ? Sexually Abused: No  ? ? ?Past Surgical History:  ?Procedure Laterality Date  ? APPENDECTOMY    ? CARDIAC CATHETERIZATION N/A 05/12/2014  ? Procedure: Left Heart Cath and Coronary Angiography;  Surgeon: Troy Sine, MD;  Location: [place] [facility];  Service: Cardiovascular;  Laterality: N/A;  ? CARDIAC CATHETERIZATION N/A 05/13/2014  ? Procedure: Coronary/Graft Atherectomy;  Surgeon: Troy Sine, MD;  Location: [facility];  Service: Cardiovascular;  Laterality: N/A;  ? CARDIAC CATHETERIZATION Right 05/13/2014  ? Procedure: Temporary Pacemaker;  Surgeon: Troy Sine, MD;  Location: [facility];  Service: Cardiovascular;  Laterality: Right;  ? CARDIAC CATHETERIZATION N/A 05/13/2014  ? Procedure: Coronary Stent Intervention;  Surgeon: Troy Sine, MD;  Location: [facility];  Service: Cardiovascular;  Laterality: N/A;  ? CARDIAC CATHETERIZATION N/A 06/18/2014  ? Procedure: Left Heart Cath and Coronary Angiography;  Surgeon: Peter M Martinique, MD;  Location: [facility];  Service: Cardiovascular;  Laterality: N/A;  ? CARDIAC CATHETERIZATION N/A 08/03/2015  ? Procedure: Left Heart Cath and Cors/Grafts Angiography;  Surgeon: Nelva Bush, MD;  Location: [facility];  Service: Cardiovascular;   Laterality: N/A;  ? CARDIOVASCULAR STRESS TEST  05/06/2014  ? CATARACT EXTRACTION W/ INTRAOCULAR LENS IMPLANT Left   ? COLONOSCOPY WITH PROPOFOL N/A 02/13/2020  ? Procedure: COLONOSCOPY WITH PROPOFOL;  Surgeon: Carol Ada, MD;  [facility];  Service: Endoscopy;  Laterality: N/A;  ? CORONARY ARTERY BYPASS GRAFT N/A 06/22/2014  ? Procedure: CORONARY ARTERY BYPASS GRAFTING (CABG) x five, using left internal mammary artery, and right leg greater saphenous vein harvested endoscopically;  Surgeon: Melrose Nakayama, MD;  Location: [facility] [facility];  Service: Open Heart Surgery;  Laterality: N/A;  ? ICD IMPLANT N/A 01/07/2019  ? Procedure: ICD IMPLANT;  Surgeon:  Thompson Grayer, MD;  Location: [facility];  Service: Cardiovascular;  Laterality: N/A;  ? LAPAROSCOPIC CHOLECYSTECTOMY    ? LEFT HEART CATH AND CORS/GRAFTS ANGIOGRAPHY N/A 01/06/2019  ? Procedure: LEFT HEART CATH AND CORS/GRAFTS ANGIOGRAPHY;  Surgeon: Nelva Bush, MD;  Location: [facility];  Service: Cardiovascular;  Laterality: N/A;  ? MAZE N/A 06/22/2014  ? Procedure: MAZE;  Surgeon: Melrose Nakayama, MD;  Location: [facility];  Service: Open Heart Surgery;  Laterality: N/A;  ? POLYPECTOMY  02/13/2020  ? Procedure: POLYPECTOMY;  Surgeon: Carol Ada, MD;  [facility];  Service: Endoscopy;;  ? TEE WITHOUT CARDIOVERSION N/A 06/22/2014  ? Procedure: TRANSESOPHAGEAL ECHOCARDIOGRAM (TEE);  Surgeon: Melrose Nakayama, MD;  Location: [place] [facility];  Service: Open Heart Surgery;  Laterality: N/A;  ? ? ?Family History  ?Problem Relation Age of Onset  ? Alzheimer's disease Mother   ? Emphysema Father   ? Cancer Brother   ? Arrhythmia Sister   ? Heart attack Sister   ? Heart disease Sister   ? Hyperlipidemia Sister   ? Hypertension Sister   ? ? ?Allergies  ?Allergen Reactions  ? Lipitor [Atorvastatin] Other (See Comments)  ?  Myopathy Spring 2006  ? Pravastatin Other (See Comments)  ?  LEG CRAMPS  ? Simvastatin Other (See Comments)  ?  LEG CRAMPS   ? ? ?Current Outpatient Medications on File Prior to Visit  ?Medication Sig Dispense Refill  ? acetaminophen (TYLENOL) 325 MG tablet Take 1-2 tablets (325-650 mg total) by mouth every 4 (four) hours as needed for

## 2021-05-11 NOTE — Patient Instructions (Signed)
Remain off of glipizide for diabetes. ? ?Reduce your NPH insulin to 45 units at night. ? ?Reduce your Novolin R (fast acting insulin) to 15 units at night.  Continue 25 units before breakfast and lunch. ? ?Start Ozempic injections for diabetes.  Inject 0.25 mg into the skin once weekly for 4 weeks, then increase to 0.5 mg once weekly thereafter. ? ?Pick up the freestyle libre monitor, let me know if you run into problems at the pharmacy. ? ?Appropriate times to check your blood sugar levels are: ? ?-Before any meal (breakfast, lunch, dinner) ?-Two hours after any meal (breakfast, lunch, dinner) ?-Bedtime ? ?Record your readings and notify me if you continue to consistently run at or above 150.  ? ?We will see you in two weeks. ? ? ?It was a pleasure to see you today! ? ?

## 2021-05-12 NOTE — Telephone Encounter (Signed)
Doesn't he get his Novolin R OTC with the pharmacy? ?Please verify.  ? ? ?

## 2021-05-12 NOTE — Telephone Encounter (Signed)
Patient does get over the counter from [facility]. Please send script to them.  ?

## 2021-05-13 ENCOUNTER — Other Ambulatory Visit: Payer: Self-pay | Admitting: Primary Care

## 2021-05-13 DIAGNOSIS — Z794 Long term (current) use of insulin: Secondary | ICD-10-CM

## 2021-05-13 NOTE — Telephone Encounter (Signed)
If he gets OTC then he doesn't need a script. The message I'm getting is that his insurance isn't covering. Is there any issue on his end? As long as he's able to get the insulin we are good. ?

## 2021-05-15 NOTE — Telephone Encounter (Signed)
Will you find out what's going? ? ?Does patient want to have the Humulin R instead of Novolin R? These are the meal coverage insulins. I think he was buying the Novolin OTC. ? ?Read pharmacy message.  ?

## 2021-05-17 NOTE — Telephone Encounter (Signed)
Per other message with patient does not need script this does no need refill he gets over the counter.  ?

## 2021-05-21 ENCOUNTER — Encounter: Payer: Self-pay | Admitting: Primary Care

## 2021-05-25 ENCOUNTER — Ambulatory Visit (INDEPENDENT_AMBULATORY_CARE_PROVIDER_SITE_OTHER): Payer: Medicare Other | Admitting: Primary Care

## 2021-05-25 ENCOUNTER — Encounter: Payer: Self-pay | Admitting: Primary Care

## 2021-05-25 VITALS — BP 110/60 | HR 74 | Temp 97.6°F | Ht 68.0 in | Wt 213.0 lb

## 2021-05-25 DIAGNOSIS — E160 Drug-induced hypoglycemia without coma: Secondary | ICD-10-CM | POA: Diagnosis not present

## 2021-05-25 DIAGNOSIS — Z794 Long term (current) use of insulin: Secondary | ICD-10-CM

## 2021-05-25 DIAGNOSIS — E1159 Type 2 diabetes mellitus with other circulatory complications: Secondary | ICD-10-CM

## 2021-05-25 DIAGNOSIS — T383X5A Adverse effect of insulin and oral hypoglycemic [antidiabetic] drugs, initial encounter: Secondary | ICD-10-CM | POA: Diagnosis not present

## 2021-05-25 LAB — POCT GLYCOSYLATED HEMOGLOBIN (HGB A1C): Hemoglobin A1C: 7.9 % — AB (ref 4.0–5.6)

## 2021-05-25 MED ORDER — FREESTYLE LIBRE 2 SENSOR MISC: 1 refills | Status: DC

## 2021-05-25 NOTE — Patient Instructions (Signed)
Continue your current regimen for now.  Call me if you have another morning in the 60's-70's.   Please answer our pharmacist's phone calls.   Please notify me if you don't hear from anyone regarding Ozempic by the end of this week.  It was a pleasure to see you today!

## 2021-05-25 NOTE — Assessment & Plan Note (Signed)
Overall stable with A1C today of 7.9.  Fortunately he's not had another episode of near syncope since his evening NPH was reduced to 15 units. We had a long conversation about his diet and the need to improve. I also encouraged him to refrain from candy bars unless his glucose readings were not increasing. He's only had 1 day of readings in the 60's-70's which is reassuring. We discussed to inject regular insulin anytime he eats a meal with some parameters.   We are working to get [employer] and Ozempic approved through his insurance.  Continue NPH 55 units in AM and 45 units in PM. Continue Regular insulin 15-25 units 2-3 times daily with meals.  Continue Farxiga 10 mg daily. Await approval for Ozempic.  Follow up very closely.

## 2021-05-25 NOTE — Progress Notes (Signed)
Subjective:    Patient ID: [name], male    DOB: Oct 11, 1945, 76 y.o.   MRN: [id number]  HPI  [name] is a very pleasant 76 y.o. male with a significant medical history including CAD, cardiomyopathy, ICD in place, atrial fibrillation, IBS, [name], type 2 diabetes, syncope who presents today for follow-up of diabetes  Current medications include: Farxiga 10 mg daily, Novolin N 55 units in AM and 45 units in PM, Novolin R 25 units with breakfast and lunch, 15 units with dinner, Ozempic 0.25 mg weekly x 1 month, then 0.5 mg weekly thereafter.  He has yet to receive Ozempic as his insurance has required PA. He is injecting Novolin R 25 units in the morning and 15 units at night.   He is checking his blood glucose continuously throughout the day and is getting readings ranging 60's-low 300's. He had a morning on 05/23/21 where he woke up in the 60's, stayed in the 60's to 70's for several hours.   He will eat a candy bar each time he drops into the 70's. He denies syncopal and near syncopal episodes, dizziness, chest pain.   Last A1C: 7.8 in February 2023, 7.9 today Last Eye Exam: Due Last Foot Exam: UTD Pneumonia Vaccination: UTD Urine Microalbumin: None. Managed on ACE-I Statin: None, intolerant   Dietary changes since last visit: None. Fast food, carbs, sweets.    Exercise: None.       Review of Systems  Respiratory:  Negative for shortness of breath.   Cardiovascular:  Negative for chest pain.  Neurological:  Negative for dizziness and syncope.        Past Medical History:  Diagnosis Date   Arthritis    "fingers, shoulders" (08/03/2015)   Atrial fibrillation ([facility])    Paroxysmal, rare episodes, sinus rhythm on flecainide, patient prefers not to take Coumadin   Bradycardia    April, 2013   Chest pain    Nuclear, 2006, no ischemia   Chronic lower back pain    "all my life"   Coronary artery disease    s/p staged cath 05/12/2014 and 5/11, DES x 2 to heavily  calcified RCA, residual with 60% prox LAD, 70% D1   Ejection fraction    EF 55-60%,  echo, January, 2011   Elevated CPK    CPK elevated with normal MB and normal troponin the past   Heart murmur    History of echocardiogram    Echo 8/16: EF 55%, normal wall motion, grade 2 diastolic dysfunction, trivial MR, normal RV function, PASP 27 mmHg   Hypertension    IBS (irritable bowel syndrome)    Myocardial infarction ([place]) 06/2014   Sinus drainage    Type II diabetes mellitus ([place])     Social History   Socioeconomic History   Marital status: Married    Spouse name: Not on file   Number of children: Not on file   Years of education: Not on file   Highest education level: Not on file  Occupational History   Not on file  Tobacco Use   Smoking status: Never   Smokeless tobacco: Never  Vaping Use   Vaping Use: Never used  Substance and Sexual Activity   Alcohol use: No   Drug use: No   Sexual activity: Not on file  Other Topics Concern   Not on file  Social History Narrative   Not on file   Social Determinants of Health   Financial Resource Strain:  Low Risk    Difficulty of Paying Living Expenses: Not hard at all  Food Insecurity: No Food Insecurity   Worried About Running Out of Food in the Last Year: Never true   Ran Out of Food in the Last Year: Never true  Transportation Needs: No Transportation Needs   Lack of Transportation (Medical): No   Lack of Transportation (Non-Medical): No  Physical Activity: Sufficiently Active   Days of Exercise per Week: 3 days   Minutes of Exercise per Session: 150+ min  Stress: No Stress Concern Present   Feeling of Stress : Not at all  Social Connections: Socially Integrated   Frequency of Communication with Friends and Family: More than three times a week   Frequency of Social Gatherings with Friends and Family: More than three times a week   Attends Religious Services: More than 4 times per year   Active Member of Clubs or  Organizations: Yes   Attends Music therapist: More than 4 times per year   Marital Status: Married  Human resources officer Violence: Not At Risk   Fear of Current or Ex-Partner: No   Emotionally Abused: No   Physically Abused: No   Sexually Abused: No    Past Surgical History:  Procedure Laterality Date   APPENDECTOMY     CARDIAC CATHETERIZATION N/A 05/12/2014   Procedure: Left Heart Cath and Coronary Angiography;  Surgeon: Troy Sine, MD;  Location: [facility];  Service: Cardiovascular;  Laterality: N/A;   CARDIAC CATHETERIZATION N/A 05/13/2014   Procedure: Coronary/Graft Atherectomy;  Surgeon: Troy Sine, MD;  Location: [facility];  Service: Cardiovascular;  Laterality: N/A;   CARDIAC CATHETERIZATION Right 05/13/2014   Procedure: Temporary Pacemaker;  Surgeon: Troy Sine, MD;  Location: [facility];  Service: Cardiovascular;  Laterality: Right;   CARDIAC CATHETERIZATION N/A 05/13/2014   Procedure: Coronary Stent Intervention;  Surgeon: Troy Sine, MD;  Location: [facility];  Service: Cardiovascular;  Laterality: N/A;   CARDIAC CATHETERIZATION N/A 06/18/2014   Procedure: Left Heart Cath and Coronary Angiography;  Surgeon: Peter M Martinique, MD;  Location: [facility];  Service: Cardiovascular;  Laterality: N/A;   CARDIAC CATHETERIZATION N/A 08/03/2015   Procedure: Left Heart Cath and Cors/Grafts Angiography;  Surgeon: Nelva Bush, MD;  Location: [facility];  Service: Cardiovascular;  Laterality: N/A;   CARDIOVASCULAR STRESS TEST  05/06/2014   CATARACT EXTRACTION W/ INTRAOCULAR LENS IMPLANT Left    COLONOSCOPY WITH PROPOFOL N/A 02/13/2020   Procedure: COLONOSCOPY WITH PROPOFOL;  Surgeon: Carol Ada, MD;  [facility];  Service: Endoscopy;  Laterality: N/A;   CORONARY ARTERY BYPASS GRAFT N/A 06/22/2014   Procedure: CORONARY ARTERY BYPASS GRAFTING (CABG) x five, using left internal mammary artery, and right leg  greater saphenous vein harvested endoscopically;  Surgeon: Melrose Nakayama, MD;  Location: [place] [facility];  Service: Open Heart Surgery;  Laterality: N/A;   ICD IMPLANT N/A 01/07/2019   Procedure: ICD IMPLANT;  Surgeon: Thompson Grayer, MD;  Location: [facility];  Service: Cardiovascular;  Laterality: N/A;   LAPAROSCOPIC CHOLECYSTECTOMY     LEFT HEART CATH AND CORS/GRAFTS ANGIOGRAPHY N/A 01/06/2019   Procedure: LEFT HEART CATH AND CORS/GRAFTS ANGIOGRAPHY;  Surgeon: Nelva Bush, MD;  Location: [facility];  Service: Cardiovascular;  Laterality: N/A;   MAZE N/A 06/22/2014   Procedure: MAZE;  Surgeon: Melrose Nakayama, MD;  Location: [facility];  Service: Open Heart Surgery;  Laterality: N/A;   POLYPECTOMY  02/13/2020   Procedure: POLYPECTOMY;  Surgeon: Carol Ada, MD;  [facility];  Service: Endoscopy;;   TEE WITHOUT CARDIOVERSION N/A 06/22/2014   Procedure: TRANSESOPHAGEAL ECHOCARDIOGRAM (TEE);  Surgeon: Melrose Nakayama, MD;  Location: [facility];  Service: Open Heart Surgery;  Laterality: N/A;    Family History  Problem Relation Age of Onset   Alzheimer's disease Mother    Emphysema Father    Cancer Brother    Arrhythmia Sister    Heart attack Sister    Heart disease Sister    Hyperlipidemia Sister    Hypertension Sister     Allergies  Allergen Reactions   Lipitor [Atorvastatin] Other (See Comments)    Myopathy Spring 2006   Pravastatin Other (See Comments)    LEG CRAMPS   Simvastatin Other (See Comments)    LEG CRAMPS    Current Outpatient Medications on File Prior to Visit  Medication Sig Dispense Refill   acetaminophen (TYLENOL) 325 MG tablet Take 1-2 tablets (325-650 mg total) by mouth every 4 (four) hours as needed for mild pain.     carvedilol (COREG) 6.25 MG tablet TAKE 1 TABLET BY MOUTH TWICE A DAY WITH MEAL 180 tablet 2   Continuous Blood Gluc Receiver (FREESTYLE LIBRE 2 READER) DEVI Use to check blood sugars. 1 each 0   dapagliflozin propanediol  (FARXIGA) 10 MG TABS tablet Take 1 tablet (10 mg total) by mouth daily before breakfast. For diabetes. 90 tablet 3   ELIQUIS 5 MG TABS tablet TAKE 1 TABLET BY MOUTH TWICE A DAY 60 tablet 5   insulin NPH Human (NOVOLIN N RELION) 100 UNIT/ML injection Inject 55 units into the skin every morning and 45 units into the skin in the evening. 30 mL 5   Insulin Pen Needle 31G X 8 MM MISC Use as directed with Lantus. 100 each 5   insulin regular (NOVOLIN R) 100 units/mL injection Inject 25 units into the skin before breakfast and lunch, inject 15 units into the skin before dinner 60 mL 2   isosorbide mononitrate (IMDUR) 30 MG 24 hr tablet Take 1 tablet (30 mg total) by mouth daily. 90 tablet 2   lisinopril (ZESTRIL) 5 MG tablet TAKE 1 TABLET (5 MG TOTAL) BY MOUTH DAILY. PLEASE KEEP UPCOMING APPT FOR FUTURE REFILLS. 90 tablet 3   nitroGLYCERIN (NITROSTAT) 0.4 MG SL tablet Place 1 tablet (0.4 mg total) under the tongue every 5 (five) minutes as needed for chest pain. 25 tablet 6   OVER THE COUNTER MEDICATION Place 1 drop into both eyes 2 (two) times daily. liquid msm eye drops/ for floaters in the eyes     tamsulosin (FLOMAX) 0.4 MG CAPS capsule Take 1 capsule (0.4 mg total) by mouth daily. For urine flow. 90 capsule 0   tiZANidine (ZANAFLEX) 4 MG tablet Take 0.5-1 tablets (2-4 mg total) by mouth at bedtime as needed for muscle spasms. 30 tablet 2   Semaglutide,0.25 or 0.5MG/DOS, (OZEMPIC, 0.25 OR 0.5 MG/DOSE,) 2 MG/1.5ML SOPN Inject 0.25 mg into the skin once weekly x4 weeks, then increase to 0.5 mg weekly thereafter for diabetes. (Patient not taking: Reported on 05/25/2021) 4.5 mL 1   No current facility-administered medications on file prior to visit.    BP 110/60   Pulse 74   Temp 97.6 F (36.4 C) (Oral)   Ht 5' 8"  (1.727 m)   Wt 213 lb (96.6 kg)   SpO2 99%   BMI 32.39 kg/m  Objective:  Physical Exam Cardiovascular:     Rate and Rhythm: Normal rate and regular rhythm.  Pulmonary:     Effort:  Pulmonary effort is normal.     Breath sounds: Normal breath sounds. No wheezing or rales.  Musculoskeletal:     Cervical back: Neck supple.  Skin:    General: Skin is warm and dry.  Neurological:     Mental Status: He is alert and oriented to person, place, and time.          Assessment & Plan:    30 minutes was spent with patient discussing glucose levels, coordinating with our pharmacist for medication assistance, and discussing treatment plan. See notes in assessment and plan

## 2021-05-26 ENCOUNTER — Telehealth: Payer: Self-pay

## 2021-05-26 NOTE — Progress Notes (Signed)
    Chronic Care Management Pharmacy Assistant   Name: WALDON SHEERIN  MRN: [id number] DOB: [birth year]  Reason for Encounter: CCM (Ozempic PAP 2023)   Patient assistance forms for Ozempic with [employer] have been completed and uploaded.  [name], CPP notified  [name], [place] Clinical Pharmacy Assistant [phone] '

## 2021-05-26 NOTE — Telephone Encounter (Signed)
Spoke with patient, he will qualify for Ozempic PAP. Patient will come to office to sign paperwork and bring proof of income.  Printed forms and placed in front office.

## 2021-05-27 ENCOUNTER — Telehealth: Payer: Self-pay

## 2021-05-27 NOTE — Telephone Encounter (Signed)
Prior auth started for [employer]. Orion Modest Key: [id number] - PA Case ID: [id number] - Rx #: [id number] Waiting for determination.

## 2021-05-27 NOTE — Progress Notes (Signed)
Error

## 2021-05-30 NOTE — Telephone Encounter (Signed)
Were you able to connect with this patient, [name]? Any updates on his CGM or Ozempic?

## 2021-05-31 NOTE — Telephone Encounter (Signed)
Thanks, Mendel Ryder! You are the best!

## 2021-05-31 NOTE — Telephone Encounter (Signed)
Yes, PA was submitted for CGM last week, still awaiting response. And patient came in to sign Ozempic paperwork 5/26, I told him it would be about a month for approval/shipment.

## 2021-06-01 NOTE — Telephone Encounter (Signed)
Received update from [facility], they request additional information. Confirmed patient has had OV within last 6 months and will have OV in next 6 months to evaluate DM. Faxed confirmation back to [facility]. Will await response.

## 2021-06-02 NOTE — Telephone Encounter (Signed)
Per covermymeds the [employer] system has been approved through 01/01/22.

## 2021-06-02 NOTE — Telephone Encounter (Signed)
Great news.

## 2021-06-05 ENCOUNTER — Other Ambulatory Visit: Payer: Self-pay | Admitting: Internal Medicine

## 2021-06-06 ENCOUNTER — Telehealth: Payer: Self-pay

## 2021-06-06 NOTE — Progress Notes (Signed)
    Chronic Care Management Pharmacy Assistant   Name: [name]  MRN: [id number] DOB: 25-Aug-1945  Reason for Encounter: CCM (Ozempic 2023 PAP approval)   Per [employer] patient has been approved for [employer] patient assistance through 01/01/2022. Medication will be delivered to office in 10-14 days from today 06/06/2021. Patient is enrolled in auto-refills. Called patient to inform him; patient understood.  [name], CPP notified  [name], [place] Clinical Pharmacy Assistant [phone]

## 2021-06-06 NOTE — Telephone Encounter (Signed)
Prescription refill request for Eliquis received. Indication: PAF Last office visit: 04/27/21  [name] MD Scr: 1.08 on 05/04/21 Age: 76 Weight: 95.3kg  Based on above findings Eliquis 19m twice daily is the appropriate dose.  Refill approved.

## 2021-06-16 ENCOUNTER — Telehealth: Payer: Self-pay

## 2021-06-16 NOTE — Telephone Encounter (Signed)
Left message for patient to call back Ozempic shipment came in and medication placed in the fridge for pick up

## 2021-06-17 NOTE — Telephone Encounter (Signed)
Called all numbers for patient and emergency contact no voice mail. Sending my chart message as well.

## 2021-06-20 NOTE — Telephone Encounter (Signed)
Called patient gave instructions for Ozempic. He wanted to verify when he can come off the insulin he is taking? Thought [name] told him to stop as soon as he started Ozempic? Did not see anything in note that gave me directions for patient. Please advise.

## 2021-06-21 NOTE — Telephone Encounter (Signed)
Patient has picked up medications no further action needed on this message.

## 2021-06-23 NOTE — Telephone Encounter (Signed)
I called to check on prior auth for [facility] 2 Sensor.  I spoke to [place] at [employer] benefits at ph # [phone].  The reader, transmitter, and sensor are approved through 01/01/22.  Approval # [id number].

## 2021-06-28 ENCOUNTER — Telehealth: Payer: Self-pay

## 2021-06-28 NOTE — Progress Notes (Signed)
    Chronic Care Management Pharmacy Assistant   Name: [name]  MRN: [id number] DOB: 1945-08-12  Reason for Encounter: CCM (Reschedule CCM Appointment)  Called patient to reschedule their 07/14/2021 appointment. Patient has been rescheduled for 07/21/2021 at 10:15.  Al Corpus, CPP notified  Claudina Lick, [place] Clinical Pharmacy Assistant [phone]

## 2021-07-06 ENCOUNTER — Ambulatory Visit (INDEPENDENT_AMBULATORY_CARE_PROVIDER_SITE_OTHER): Payer: Medicare Other

## 2021-07-06 DIAGNOSIS — I255 Ischemic cardiomyopathy: Secondary | ICD-10-CM

## 2021-07-09 LAB — CUP PACEART REMOTE DEVICE CHECK
Battery Remaining Longevity: 120 mo
Battery Voltage: 3.01 V
Brady Statistic RV Percent Paced: 0.01 %
Date Time Interrogation Session: 20230705033423
HighPow Impedance: 71 Ohm
Implantable Lead Implant Date: 20210105
Implantable Lead Location: 753860
Implantable Pulse Generator Implant Date: 20210105
Lead Channel Impedance Value: 342 Ohm
Lead Channel Impedance Value: 456 Ohm
Lead Channel Pacing Threshold Amplitude: 0.375 V
Lead Channel Pacing Threshold Pulse Width: 0.4 ms
Lead Channel Sensing Intrinsic Amplitude: 10.5 mV
Lead Channel Sensing Intrinsic Amplitude: 10.5 mV
Lead Channel Setting Pacing Amplitude: 2.5 V
Lead Channel Setting Pacing Pulse Width: 0.4 ms
Lead Channel Setting Sensing Sensitivity: 0.3 mV

## 2021-07-11 ENCOUNTER — Telehealth: Payer: Self-pay

## 2021-07-11 NOTE — Telephone Encounter (Signed)
Scheduled remote reviewed. Normal device function.   Persistent AF since 05/23/21. On Eliquis. Good ventricular rate control. Kn. history of AF. burden 50.9%. VVI mode.    LVM for patient to call device clinic back, signs and symptoms of increase AF burden, possible AF clinic apt to discuss increase of burden

## 2021-07-12 NOTE — Telephone Encounter (Signed)
Spoke to patient and declines any complaints. Also noted Dr. Bonita Quin note and no changes were recommended, routine follow up. Patient aware to call if any changes occur. Appreciative of call.

## 2021-07-14 ENCOUNTER — Other Ambulatory Visit: Payer: Self-pay | Admitting: Primary Care

## 2021-07-14 ENCOUNTER — Telehealth: Payer: Medicare Other

## 2021-07-14 DIAGNOSIS — N400 Enlarged prostate without lower urinary tract symptoms: Secondary | ICD-10-CM

## 2021-07-18 ENCOUNTER — Telehealth: Payer: Self-pay

## 2021-07-18 NOTE — Progress Notes (Signed)
Chronic Care Management Pharmacy Assistant   Name: [name]  MRN: [id number] DOB: August 24, 1945  Reason for Encounter: CCM (Appointment Reminder)  Recent office visits:  05/25/21 Alma Friendly, NP DM A1C 7.9 No med changes  05/11/21 Alma Friendly, NP DM Start: Ozempic 0.25 weekly Change: NOVOLIN N RELION 100 UNIT change: NOVOLIN R 100 units Stop: Glipizide FU 2 weeks  02/23/21 Alma Friendly, NP DM A1C 7.8 No med changes FU 3 months  Recent consult visits:  04/27/21 Larae Grooms, MD (Cardiology) CHF Abnormal Labs: "LDL still above target.  PCSK9 inhibitor would be next choice.  Can contact Megan if he is willing to try. I think this would lower his risk going forward." Stop: (patient) Ezetimibe 10 mg FU 12 months  Hospital visits:  None in previous 6 months  Medications: Outpatient Encounter Medications as of 07/18/2021  Medication Sig   acetaminophen (TYLENOL) 325 MG tablet Take 1-2 tablets (325-650 mg total) by mouth every 4 (four) hours as needed for mild pain.   carvedilol (COREG) 6.25 MG tablet TAKE 1 TABLET BY MOUTH TWICE A DAY WITH MEAL   Continuous Blood Gluc Receiver (FREESTYLE LIBRE 2 READER) DEVI Use to check blood sugars.   Continuous Blood Gluc Sensor (FREESTYLE LIBRE 2 SENSOR) MISC Apply every 14 days to check blood sugars.   dapagliflozin propanediol (FARXIGA) 10 MG TABS tablet Take 1 tablet (10 mg total) by mouth daily before breakfast. For diabetes.   ELIQUIS 5 MG TABS tablet TAKE 1 TABLET BY MOUTH TWICE A DAY   insulin NPH Human (NOVOLIN N RELION) 100 UNIT/ML injection Inject 55 units into the skin every morning and 45 units into the skin in the evening.   Insulin Pen Needle 31G X 8 MM MISC Use as directed with Lantus.   insulin regular (NOVOLIN R) 100 units/mL injection Inject 25 units into the skin before breakfast and lunch, inject 15 units into the skin before dinner   isosorbide mononitrate (IMDUR) 30 MG 24 hr tablet Take 1 tablet (30 mg total) by  mouth daily.   lisinopril (ZESTRIL) 5 MG tablet TAKE 1 TABLET (5 MG TOTAL) BY MOUTH DAILY. PLEASE KEEP UPCOMING APPT FOR FUTURE REFILLS.   nitroGLYCERIN (NITROSTAT) 0.4 MG SL tablet Place 1 tablet (0.4 mg total) under the tongue every 5 (five) minutes as needed for chest pain.   OVER THE COUNTER MEDICATION Place 1 drop into both eyes 2 (two) times daily. liquid msm eye drops/ for floaters in the eyes   Semaglutide,0.25 or 0.5MG/DOS, (OZEMPIC, 0.25 OR 0.5 MG/DOSE,) 2 MG/1.5ML SOPN Inject 0.25 mg into the skin once weekly x4 weeks, then increase to 0.5 mg weekly thereafter for diabetes. (Patient not taking: Reported on 05/25/2021)   tamsulosin (FLOMAX) 0.4 MG CAPS capsule TAKE 1 CAPSULE (0.4 MG TOTAL) BY MOUTH DAILY. FOR URINE FLOW.   tiZANidine (ZANAFLEX) 4 MG tablet Take 0.5-1 tablets (2-4 mg total) by mouth at bedtime as needed for muscle spasms.   No facility-administered encounter medications on file as of 07/18/2021.   [name] was contacted to remind of upcoming telephone visit with [name]  on 07/21/2021 at 10:15. Patient was reminded to have any blood glucose and blood pressure readings available for review at appointment.   Patient confirmed appointment. Spoke with patient's wife for the reminder.  Are you having any problems with your medications? No   Do you have any concerns you like to discuss with the pharmacist? No  CCM referral has been placed  prior to visit?  No   Star Rating Drugs: Medication:  Last Fill: Day Supply Lisinopril 5 mg 07/03/21 90 Ozempic 0.25 mg PAP  [name], CPP notified  [name], [place] Pharmacy Assistant [phone] '

## 2021-07-20 ENCOUNTER — Telehealth: Payer: Self-pay

## 2021-07-20 NOTE — Progress Notes (Signed)
    Chronic Care Management Pharmacy Assistant   Name: [name]  MRN: [id number] DOB: 06-08-1945   Reason for Encounter: CCM (Reschedule Appointment)   Patient called in wanting to reschedule his original appointment with [name] on 07/21/2021. New appointment date is 09/22/2021 at 2:15.  [name], CPP notified  [name], [place] Clinical Pharmacy Assistant [phone]

## 2021-07-21 ENCOUNTER — Telehealth: Payer: Medicare Other

## 2021-07-27 NOTE — Progress Notes (Signed)
Remote ICD transmission.   

## 2021-08-01 ENCOUNTER — Telehealth: Payer: Self-pay | Admitting: *Deleted

## 2021-08-01 NOTE — Telephone Encounter (Signed)
PLEASE NOTE: All timestamps contained within this report are represented as [place] Standard Time. CONFIDENTIALTY NOTICE: This fax transmission is intended only for the addressee. It contains information that is legally privileged, confidential or otherwise protected from use or disclosure. If you are not the intended recipient, you are strictly prohibited from reviewing, disclosing, copying using or disseminating any of this information or taking any action in reliance on or regarding this information. If you have received this fax in error, please notify us immediately by telephone so that we can arrange for its return to [place]. Phone: [phone], Toll-Free: [phone], Fax: [phone] Page: 1 of 1 Call Id: [id number] [place] Night - Client Nonclinical Telephone Record  [facility] Client [place] Night - Client Client Site [place] Provider Alma Friendly - NP Contact Type Call Who Is Calling Patient / Member / Family / Caregiver Caller Name Bailee Thall Caller Phone Number [phone] Patient Name [name] Patient DOB 18-Oct-1945 Call Type Message Only Information Provided Reason for Call Request to Schedule Office Appointment Initial Comment Caller states he is calling for a Cortisone shot appointment Patient request to speak to RN No Additional Comment Provided office hours Disp. Time Disposition Final User 08/01/2021 7:42:13 AM General Information Provided Yes Wynetta Emery, [place] Call Closed By: Archie Balboa Transaction Date/Time: 08/01/2021 7:40:07 AM (ET)

## 2021-08-01 NOTE — Telephone Encounter (Signed)
Spoke to patient by telephone and was advised that he is having right shoulder pain for about a week. Patient stated that he has had Dr. Lorelei Pont give him cortisone shots before and would like another one. Patient stated that it has been about a year since he has had a cortisone shot. Appointment scheduled with Dr. Lorelei Pont 08/03/21 at 9:40 am.

## 2021-08-03 ENCOUNTER — Ambulatory Visit (INDEPENDENT_AMBULATORY_CARE_PROVIDER_SITE_OTHER): Payer: Medicare Other | Admitting: Family Medicine

## 2021-08-03 ENCOUNTER — Encounter: Payer: Self-pay | Admitting: Family Medicine

## 2021-08-03 VITALS — BP 110/60 | HR 97 | Temp 97.4°F | Ht 68.0 in | Wt 207.4 lb

## 2021-08-03 DIAGNOSIS — M7501 Adhesive capsulitis of right shoulder: Secondary | ICD-10-CM

## 2021-08-03 MED ORDER — TRIAMCINOLONE ACETONIDE 40 MG/ML IJ SUSP
40.0000 mg | Freq: Once | INTRAMUSCULAR | Status: AC
  Administered 2021-08-03: 40 mg via INTRA_ARTICULAR

## 2021-08-03 NOTE — Progress Notes (Signed)
Jr Milliron T. Hasna Stefanik, MD, [place] at [facility] [place], [address]  Phone: [phone]  FAX: [place] - 76 y.o. male  MRN [id number]  Date of Birth: [birth year]  Date: 08/03/2021  PCP: [name], NP  Referral: [name], NP  Chief Complaint  Patient presents with   Shoulder Pain    Right-Wants Injection   Subjective:   [name] is a 76 y.o. very pleasant male patient who presents with the following: shoulder pain  The patient noted above presents with R shoulder pain that has been ongoing for 4 weeks. there is no history of trauma or accident. The patient denies neck pain or radicular symptoms. No shoulder blade pain Denies dislocation, subluxation, separation of the shoulder. The patient does complain of pain with flexion, abduction, and terminal motion.  Significant restriction of motion. he describes a deep ache around the shoulder, and sometimes it will wake the patient up at night.  Ice or Heat: minimal help Tried PT: No  Prior shoulder Injury: No Prior surgery: No Prior fracture: No   Review of Systems is noted in the HPI, as appropriate  Objective:   Blood pressure 110/60, pulse 97, temperature (!) 97.4 F (36.3 C), temperature source Oral, height 5' 8"  (1.727 m), weight 207 lb 6 oz (94.1 kg), SpO2 96 %.   Shoulder: R and L Inspection: No muscle wasting or winging Ecchymosis/edema: neg  AC joint, scapula, clavicle: NT Cervical spine: NT, full ROM Spurling's: neg ABNORMAL SIDE TESTED: R UNLESS OTHERWISE NOTED, THE CONTRALATERAL SIDE HAS FULL RANGE OF MOTION. Abduction: 5/5, LIMITED TO 165 DEGREES Flexion: 5/5, LIMITED TO 160 DEGNO ROM  IR, lift-off: 5/5. TESTED AT 90 DEGREES OF ABDUCTION, LIMITED TO 0 DEGREES ER at neutral:  5/5, TESTED AT 90 DEGREES OF ABDUCTION, LIMITED TO 50 DEGREES AC crossover and compression: PAIN Drop Test: neg Empty Can:  neg Supraspinatus insertion: NT Bicipital groove: NT ALL OTHER SPECIAL TESTING EQUIVOCAL GIVEN LOSS OF MOTION C5-T1 intact Sensation intact Grip 5/5   Assessment and Plan:     ICD-10-CM   1. Adhesive capsulitis of right shoulder  M75.01      He will work on daily motion, probable secondary to some underlying cuff tendinopathy.    The average length of total symptoms is 12 months going through 3 different phases in the freezing and thawing process. There can be a permanent loss of motion in some cases.   Intraarticular shoulder injections discussed with patient, which have good evidence for accelerating the thawing phase.  Intraarticular Shoulder Aspiration/Injection Procedure Note [name] 27-Sep-1945 Date of procedure: 08/03/2021  Procedure: Large Joint Aspiration / Injection of Shoulder, Intraarticular, R Indications: Pain  Procedure Details Verbal consent was obtained from the patient. Risks explained and contrasted with benefits and alternatives. Patient prepped with Chloraprep and Ethyl Chloride used for anesthesia. An intraarticular shoulder injection was performed using the posterior approach; needle placed into joint capsule without difficulty. The patient tolerated the procedure well and had decreased pain post injection. No complications. Injection: 9 cc of Lidocaine 1% and 1 mL Kenalog 40 mg. Needle: 21 gauge, 2 inch   F/u 2 mo if not improving  Signed,  Kesia Dalto T. [name], MD   Outpatient Encounter Medications as of 08/03/2021  Medication Sig   acetaminophen (TYLENOL) 325 MG tablet Take 1-2 tablets (325-650 mg total) by mouth every 4 (four) hours as needed for mild  pain.   carvedilol (COREG) 6.25 MG tablet TAKE 1 TABLET BY MOUTH TWICE A DAY WITH MEAL   Continuous Blood Gluc Receiver (FREESTYLE LIBRE 2 READER) DEVI Use to check blood sugars.   Continuous Blood Gluc Sensor (FREESTYLE LIBRE 2 SENSOR) MISC Apply every 14 days to check blood sugars.    dapagliflozin propanediol (FARXIGA) 10 MG TABS tablet Take 1 tablet (10 mg total) by mouth daily before breakfast. For diabetes.   ELIQUIS 5 MG TABS tablet TAKE 1 TABLET BY MOUTH TWICE A DAY   insulin NPH Human (NOVOLIN N RELION) 100 UNIT/ML injection Inject 55 units into the skin every morning and 45 units into the skin in the evening.   Insulin Pen Needle 31G X 8 MM MISC Use as directed with Lantus.   insulin regular (NOVOLIN R) 100 units/mL injection Inject 25 units into the skin before breakfast and lunch, inject 15 units into the skin before dinner   isosorbide mononitrate (IMDUR) 30 MG 24 hr tablet Take 1 tablet (30 mg total) by mouth daily.   lisinopril (ZESTRIL) 5 MG tablet TAKE 1 TABLET (5 MG TOTAL) BY MOUTH DAILY. PLEASE KEEP UPCOMING APPT FOR FUTURE REFILLS.   nitroGLYCERIN (NITROSTAT) 0.4 MG SL tablet Place 1 tablet (0.4 mg total) under the tongue every 5 (five) minutes as needed for chest pain.   OVER THE COUNTER MEDICATION Place 1 drop into both eyes 2 (two) times daily. liquid msm eye drops/ for floaters in the eyes   Semaglutide,0.25 or 0.5MG/DOS, (OZEMPIC, 0.25 OR 0.5 MG/DOSE,) 2 MG/1.5ML SOPN Inject 0.25 mg into the skin once weekly x4 weeks, then increase to 0.5 mg weekly thereafter for diabetes.   tamsulosin (FLOMAX) 0.4 MG CAPS capsule TAKE 1 CAPSULE (0.4 MG TOTAL) BY MOUTH DAILY. FOR URINE FLOW.   tiZANidine (ZANAFLEX) 4 MG tablet Take 0.5-1 tablets (2-4 mg total) by mouth at bedtime as needed for muscle spasms.   No facility-administered encounter medications on file as of 08/03/2021.

## 2021-08-30 ENCOUNTER — Telehealth: Payer: Self-pay

## 2021-08-30 NOTE — Chronic Care Management (AMB) (Signed)
Forms completed and uploaded for Ozempic refills for 2023.  [name], CPP notified  Avel Sensor, [place]  [phone]

## 2021-09-01 ENCOUNTER — Telehealth: Payer: Self-pay

## 2021-09-01 NOTE — Chronic Care Management (AMB) (Signed)
Chronic Care Management Pharmacy Assistant   Name: [name]  MRN: [id number] DOB: May 08, 1945  Reason for Encounter: Diabetes Disease State  Recent office visits:  08/03/21-Spencer Copland,MD(fam med)-right shoulder adhesive capsulitis,cortisone injection (kenalog) 05/25/21-Katherine Clark,NP(PCP)-f/u DM-Labs ordered(A1c 7.9),watch for low sugars,working on [employer] 2 and Ozempic from [employer].  Recent consult visits:  04/27/21-Jayadeep Varanasi,MD(cardio)-f/u heart failure,future labs ordered, no medication changes, f/u 6 months  Hospital visits:  None in previous 6 months  Medications: Outpatient Encounter Medications as of 09/01/2021  Medication Sig   acetaminophen (TYLENOL) 325 MG tablet Take 1-2 tablets (325-650 mg total) by mouth every 4 (four) hours as needed for mild pain.   carvedilol (COREG) 6.25 MG tablet TAKE 1 TABLET BY MOUTH TWICE A DAY WITH MEAL   Continuous Blood Gluc Receiver (FREESTYLE LIBRE 2 READER) DEVI Use to check blood sugars.   Continuous Blood Gluc Sensor (FREESTYLE LIBRE 2 SENSOR) MISC Apply every 14 days to check blood sugars.   dapagliflozin propanediol (FARXIGA) 10 MG TABS tablet Take 1 tablet (10 mg total) by mouth daily before breakfast. For diabetes.   ELIQUIS 5 MG TABS tablet TAKE 1 TABLET BY MOUTH TWICE A DAY   insulin NPH Human (NOVOLIN N RELION) 100 UNIT/ML injection Inject 55 units into the skin every morning and 45 units into the skin in the evening.   Insulin Pen Needle 31G X 8 MM MISC Use as directed with Lantus.   insulin regular (NOVOLIN R) 100 units/mL injection Inject 25 units into the skin before breakfast and lunch, inject 15 units into the skin before dinner   isosorbide mononitrate (IMDUR) 30 MG 24 hr tablet Take 1 tablet (30 mg total) by mouth daily.   lisinopril (ZESTRIL) 5 MG tablet TAKE 1 TABLET (5 MG TOTAL) BY MOUTH DAILY. PLEASE KEEP UPCOMING APPT FOR FUTURE REFILLS.   nitroGLYCERIN (NITROSTAT) 0.4 MG SL tablet Place  1 tablet (0.4 mg total) under the tongue every 5 (five) minutes as needed for chest pain.   OVER THE COUNTER MEDICATION Place 1 drop into both eyes 2 (two) times daily. liquid msm eye drops/ for floaters in the eyes   Semaglutide,0.25 or 0.5MG/DOS, (OZEMPIC, 0.25 OR 0.5 MG/DOSE,) 2 MG/1.5ML SOPN Inject 0.25 mg into the skin once weekly x4 weeks, then increase to 0.5 mg weekly thereafter for diabetes.   tamsulosin (FLOMAX) 0.4 MG CAPS capsule TAKE 1 CAPSULE (0.4 MG TOTAL) BY MOUTH DAILY. FOR URINE FLOW.   tiZANidine (ZANAFLEX) 4 MG tablet Take 0.5-1 tablets (2-4 mg total) by mouth at bedtime as needed for muscle spasms.   No facility-administered encounter medications on file as of 09/01/2021.      Recent Relevant Labs: Lab Results  Component Value Date/Time   HGBA1C 7.9 (A) 05/25/2021 08:20 AM   HGBA1C 7.8 (A) 02/23/2021 08:57 AM   HGBA1C 9.2 (H) 01/06/2019 01:51 AM   HGBA1C 9.5 (H) 09/13/2018 09:04 AM   MICROALBUR 1.6 11/04/2015 08:26 AM   MICROALBUR 0.2 12/11/2014 01:22 PM    Kidney Function Lab Results  Component Value Date/Time   CREATININE 1.08 05/04/2021 09:12 AM   CREATININE 0.98 12/07/2020 08:35 AM   CREATININE 1.00 08/12/2015 09:34 AM   CREATININE 0.83 03/10/2015 04:56 PM   GFR 75.44 12/07/2020 08:35 AM   GFRNONAA >60 01/07/2019 02:30 AM   GFRAA >60 01/07/2019 02:30 AM     Contacted patient on 08/31/21 to discuss diabetes disease state.   Current antihyperglycemic regimen:  Ozempic 0.95m inject weekly(Sundays)- patient has 2 pens  left  NPH  55 units am,45 pm Regular insuklin 15-25 units 2-3 times a day Farxiga 59m 1 tablet before breakfast FreeStyle Libre 2    Patient verbally confirms he is taking the above medications as directed. Yes  The patient reports he had nausea and vomiting for 3 days when he first started the 0.239mOzempic. This subsided and he is on 0.48m97mnce weekly now and doing well.  What diet changes have been made to improve diabetes control?  Fresh vegetables,chooses foods low in sugar  What recent interventions/DTPs have been made to improve glycemic control:   PAP for Ozempic and FreeStyle Libre 2  Have there been any recent hospitalizations or ED visits since last visit with CPP? No  Patient reports hypoglycemic symptoms, including Sweaty, Shaky, and Nervous/irritable  Patient denies hyperglycemic symptoms, including blurry vision, excessive thirst, fatigue, polyuria, and weakness  How often are you checking your blood sugar? 3-4 times daily  What are your blood sugars ranging?  Fasting: 08/28/21-284  08/29/21-117 08/30/21-138 08/31/21-134 After meals: 08/28/21-321 08/29/21-186 08/30/21-278 08/31/21-304 Bedtime: 08/28/21-252 08/29/21-256 08/30/21-218 08/31/21-227  During the week, how often does your blood glucose drop below 70?   2 episodes over the last month of fasting 64 The 15-15 rule for low sugars: If sugar reading below 70, have 15 grams of carbohydrate to raise your blood sugar and check it after 15 minutes. If it's still below 70 mg/dL, have another serving. 15 grams of carbs may be: -Glucose tablets (see instructions) -Gel tube (see instructions) -4 ounces (1/2 cup) of juice or regular soda (not diet) -1 tablespoon of sugar, honey, or corn syrup -Hard candies, jellybeans or gumdrops--see food label for how many to consume  Repeat these steps until your blood sugar is at least 70 mg/dL. Once your blood sugar is back to normal, eat a meal or snack to make sure it doesn't lower again.    Are you checking your feet daily/regularly? Yes  Adherence Review: Is the patient currently on a STATIN medication? No Is the patient currently on ACE/ARB medication? Yes Does the patient have >5 day gap between last estimated fill dates? No  Care Gaps: Annual wellness visit in last year? Yes Most recent A1C reading:7.9 Most Recent BP reading:110/60    Last eye exam / retinopathy screening:2021 Last diabetic foot exam: UTD  Star  Rating Drugs:  Medication:  Last Fill: Day Supply Farxiga 71m106mmanufacturer   Novolin NPH Human Ozempic 0.48mg 448mnufacturer Lisinopril 48mg  52m/23  90  PCP appointment on 09/19/21 and CCM appointment on 09/22/21  Please contact patient - see how he is doing with Ozempic. What dose is he injecting, how many pens does he have left? Also see if you can get a sugar log.   LindseCharlene Brookenotified  VelmenAvel Sensor HLaurel [phone]

## 2021-09-19 ENCOUNTER — Ambulatory Visit (INDEPENDENT_AMBULATORY_CARE_PROVIDER_SITE_OTHER): Payer: Medicare Other | Admitting: Primary Care

## 2021-09-19 ENCOUNTER — Encounter: Payer: Self-pay | Admitting: Primary Care

## 2021-09-19 VITALS — BP 118/68 | HR 96 | Temp 98.0°F | Ht 68.0 in | Wt 208.6 lb

## 2021-09-19 DIAGNOSIS — E1159 Type 2 diabetes mellitus with other circulatory complications: Secondary | ICD-10-CM

## 2021-09-19 DIAGNOSIS — Z23 Encounter for immunization: Secondary | ICD-10-CM | POA: Diagnosis not present

## 2021-09-19 DIAGNOSIS — Z794 Long term (current) use of insulin: Secondary | ICD-10-CM | POA: Diagnosis not present

## 2021-09-19 LAB — POCT GLYCOSYLATED HEMOGLOBIN (HGB A1C): Hemoglobin A1C: 8.1 % — AB (ref 4.0–5.6)

## 2021-09-19 NOTE — Patient Instructions (Signed)
I will work with the pharmacist to get your Ozempic dose increased to 1 mg weekly.  For your insulin:  Novolin N: Inject 55 units in the morning, inject 45 units in the PM.  Novolin R: Inject 20-25 units with breakfast and lunch, inject 15 units with dinner.  Please update me via MyChart in 2-3 weeks.  Please schedule a follow up visit for 3 months for diabetes check.  It was a pleasure to see you today!

## 2021-09-19 NOTE — Assessment & Plan Note (Signed)
Slightly worse than last visit, also is not following instructions regarding insulin use. Fortunately, he is not dropping below 60, however would like to see his low readings higher than in the 60's.  Increase Ozempic to 1 mg weekly. Will work with pharmacy for discounted price. Discussed to start taking insulin as recommended last visit: Novolin N 55 units in AM, 45 units in PM; Novolin R 25 units for breakfast/lunch, 15 units with dinner. Can reduce Novolin by 5 units each dose if needed.  Continue Farxiga 10 mg daily.  I recommended he update [place] via MyChart or phone call in 2-3 weeks. Work on diet.   Follow up in 3 months.

## 2021-09-19 NOTE — Progress Notes (Signed)
Subjective:    Patient ID: [name], male    DOB: 14-Oct-1945, 76 y.o.   MRN: [id number]  Diabetes Pertinent negatives for hypoglycemia include no dizziness. Pertinent negatives for diabetes include no chest pain.    [name] is a very pleasant 76 y.o. male with a significant medical history including CAD, cardiomyopathy, unstable angina, [name], uncontrolled type 2 diabetes, BPH, hyperlipidemia, atrial fibrillation who presents today for follow-up of diabetes.  Current medications include: Farxiga 10 mg daily, Ozempic 0.5 mg weekly, Novolin R 25 units before breakfast/lunch, 15 units before dinner; Novolin N in 55 units in a.m., 45 units in p.m.  He is injecting Novolin N 50 in AM and 50 units in PM and Novolin R 20 units twice daily.   He is checking his blood glucose continuously and is getting readings of mid 100's to low 200's. He continues to drop into the 60's, mostly at night. No syncopal episodes since May 2023.  Last A1C: 7.9 in May 2023, 8.1 today Last Eye Exam: Due Last Foot Exam: Due Pneumonia Vaccination: 2018 Urine Microalbumin: None, managed on ACE-I Statin: None, intolerant   Dietary changes since last visit:   Exercise: None  BP Readings from Last 3 Encounters:  09/19/21 118/68  08/03/21 110/60  05/25/21 110/60     Review of Systems  Respiratory:  Negative for shortness of breath.   Cardiovascular:  Negative for chest pain.  Neurological:  Negative for dizziness, light-headedness and numbness.         Past Medical History:  Diagnosis Date   Arthritis    "fingers, shoulders" (08/03/2015)   Atrial fibrillation (HCC)    Paroxysmal, rare episodes, sinus rhythm on flecainide, patient prefers not to take Coumadin   Bradycardia    April, 2013   Chest pain    Nuclear, 2006, no ischemia   Chronic lower back pain    "all my life"   Coronary artery disease    s/p staged cath 05/12/2014 and 5/11, DES x 2 to heavily calcified RCA, residual with 60%  prox LAD, 70% D1   Ejection fraction    EF 55-60%,  echo, January, 2011   Elevated CPK    CPK elevated with normal MB and normal troponin the past   Heart murmur    History of echocardiogram    Echo 8/16: EF 55%, normal wall motion, grade 2 diastolic dysfunction, trivial MR, normal RV function, PASP 27 mmHg   Hypertension    IBS (irritable bowel syndrome)    Myocardial infarction ([place]) 06/2014   Sinus drainage    Type II diabetes mellitus ([place])     Social History   Socioeconomic History   Marital status: Married    Spouse name: Not on file   Number of children: Not on file   Years of education: Not on file   Highest education level: Not on file  Occupational History   Not on file  Tobacco Use   Smoking status: Never   Smokeless tobacco: Never  Vaping Use   Vaping Use: Never used  Substance and Sexual Activity   Alcohol use: No   Drug use: No   Sexual activity: Not on file  Other Topics Concern   Not on file  Social History Narrative   Not on file   Social Determinants of Health   Financial Resource Strain: Low Risk  (01/07/2021)   Overall Financial Resource Strain (CARDIA)    Difficulty of Paying Living Expenses: Not hard at  all  Recent Concern: Financial Resource Strain - High Risk (12/21/2020)   Overall Financial Resource Strain (CARDIA)    Difficulty of Paying Living Expenses: Hard  Food Insecurity: No Food Insecurity (01/07/2021)   Hunger Vital Sign    Worried About Running Out of Food in the Last Year: Never true    Ran Out of Food in the Last Year: Never true  Transportation Needs: No Transportation Needs (01/07/2021)   PRAPARE - Hydrologist (Medical): No    Lack of Transportation (Non-Medical): No  Physical Activity: Sufficiently Active (01/07/2021)   Exercise Vital Sign    Days of Exercise per Week: 3 days    Minutes of Exercise per Session: 150+ min  Stress: No Stress Concern Present (01/07/2021)   [place]    Feeling of Stress : Not at all  Social Connections: [place] (01/07/2021)   Social Connection and Isolation Panel [NHANES]    Frequency of Communication with Friends and Family: More than three times a week    Frequency of Social Gatherings with Friends and Family: More than three times a week    Attends Religious Services: More than 4 times per year    Active Member of Clubs or Organizations: Yes    Attends Archivist Meetings: More than 4 times per year    Marital Status: Married  Human resources officer Violence: Not At Risk (01/07/2021)   Humiliation, Afraid, Rape, and Kick questionnaire    Fear of Current or Ex-Partner: No    Emotionally Abused: No    Physically Abused: No    Sexually Abused: No    Past Surgical History:  Procedure Laterality Date   APPENDECTOMY     CARDIAC CATHETERIZATION N/A 05/12/2014   Procedure: Left Heart Cath and Coronary Angiography;  Surgeon: Troy Sine, MD;  Location: [facility];  Service: Cardiovascular;  Laterality: N/A;   CARDIAC CATHETERIZATION N/A 05/13/2014   Procedure: Coronary/Graft Atherectomy;  Surgeon: Troy Sine, MD;  Location: [facility];  Service: Cardiovascular;  Laterality: N/A;   CARDIAC CATHETERIZATION Right 05/13/2014   Procedure: Temporary Pacemaker;  Surgeon: Troy Sine, MD;  Location: [facility];  Service: Cardiovascular;  Laterality: Right;   CARDIAC CATHETERIZATION N/A 05/13/2014   Procedure: Coronary Stent Intervention;  Surgeon: Troy Sine, MD;  Location: [facility];  Service: Cardiovascular;  Laterality: N/A;   CARDIAC CATHETERIZATION N/A 06/18/2014   Procedure: Left Heart Cath and Coronary Angiography;  Surgeon: Peter M Martinique, MD;  Location: [facility];  Service: Cardiovascular;  Laterality: N/A;   CARDIAC CATHETERIZATION N/A 08/03/2015   Procedure: Left Heart Cath and Cors/Grafts Angiography;  Surgeon:  Nelva Bush, MD;  Location: [facility];  Service: Cardiovascular;  Laterality: N/A;   CARDIOVASCULAR STRESS TEST  05/06/2014   CATARACT EXTRACTION W/ INTRAOCULAR LENS IMPLANT Left    COLONOSCOPY WITH PROPOFOL N/A 02/13/2020   Procedure: COLONOSCOPY WITH PROPOFOL;  Surgeon: Carol Ada, MD;  Location: [facility];  Service: Endoscopy;  Laterality: N/A;   CORONARY ARTERY BYPASS GRAFT N/A 06/22/2014   Procedure: CORONARY ARTERY BYPASS GRAFTING (CABG) x five, using left internal mammary artery, and right leg greater saphenous vein harvested endoscopically;  Surgeon: Melrose Nakayama, MD;  Location: [facility];  Service: Open Heart Surgery;  Laterality: N/A;   ICD IMPLANT N/A 01/07/2019   Procedure: ICD IMPLANT;  Surgeon: Thompson Grayer, MD;  Location: [facility];  Service: Cardiovascular;  Laterality: N/A;   LAPAROSCOPIC CHOLECYSTECTOMY     LEFT HEART CATH AND CORS/GRAFTS ANGIOGRAPHY N/A 01/06/2019   Procedure: LEFT HEART CATH AND CORS/GRAFTS ANGIOGRAPHY;  Surgeon: Nelva Bush, MD;  Location: [facility];  Service: Cardiovascular;  Laterality: N/A;   MAZE N/A 06/22/2014   Procedure: MAZE;  Surgeon: Melrose Nakayama, MD;  Location: [facility];  Service: Open Heart Surgery;  Laterality: N/A;   POLYPECTOMY  02/13/2020   Procedure: POLYPECTOMY;  Surgeon: Carol Ada, MD;  Location: [facility];  Service: Endoscopy;;   TEE WITHOUT CARDIOVERSION N/A 06/22/2014   Procedure: TRANSESOPHAGEAL ECHOCARDIOGRAM (TEE);  Surgeon: Melrose Nakayama, MD;  Location: [facility];  Service: Open Heart Surgery;  Laterality: N/A;    Family History  Problem Relation Age of Onset   Alzheimer's disease Mother    Emphysema Father    Cancer Brother    Arrhythmia Sister    Heart attack Sister    Heart disease Sister    Hyperlipidemia Sister    Hypertension Sister     Allergies  Allergen Reactions   Lipitor [Atorvastatin] Other (See Comments)    Myopathy Spring 2006   Pravastatin Other (See  Comments)    LEG CRAMPS   Simvastatin Other (See Comments)    LEG CRAMPS    Current Outpatient Medications on File Prior to Visit  Medication Sig Dispense Refill   acetaminophen (TYLENOL) 325 MG tablet Take 1-2 tablets (325-650 mg total) by mouth every 4 (four) hours as needed for mild pain.     carvedilol (COREG) 6.25 MG tablet TAKE 1 TABLET BY MOUTH TWICE A DAY WITH MEAL 180 tablet 2   Continuous Blood Gluc Receiver (FREESTYLE LIBRE 2 READER) DEVI Use to check blood sugars. 1 each 0   Continuous Blood Gluc Sensor (FREESTYLE LIBRE 2 SENSOR) MISC Apply every 14 days to check blood sugars. 6 each 1   dapagliflozin propanediol (FARXIGA) 10 MG TABS tablet Take 1 tablet (10 mg total) by mouth daily before breakfast. For diabetes. 90 tablet 3   ELIQUIS 5 MG TABS tablet TAKE 1 TABLET BY MOUTH TWICE A DAY 60 tablet 5   insulin NPH Human (NOVOLIN N RELION) 100 UNIT/ML injection Inject 55 units into the skin every morning and 45 units into the skin in the evening. 30 mL 5   Insulin Pen Needle 31G X 8 MM MISC Use as directed with Lantus. 100 each 5   insulin regular (NOVOLIN R) 100 units/mL injection Inject 25 units into the skin before breakfast and lunch, inject 15 units into the skin before dinner 60 mL 2   isosorbide mononitrate (IMDUR) 30 MG 24 hr tablet Take 1 tablet (30 mg total) by mouth daily. 90 tablet 2   lisinopril (ZESTRIL) 5 MG tablet TAKE 1 TABLET (5 MG TOTAL) BY MOUTH DAILY. PLEASE KEEP UPCOMING APPT FOR FUTURE REFILLS. 90 tablet 3   nitroGLYCERIN (NITROSTAT) 0.4 MG SL tablet Place 1 tablet (0.4 mg total) under the tongue every 5 (five) minutes as needed for chest pain. 25 tablet 6   OVER THE COUNTER MEDICATION Place 1 drop into both eyes 2 (two) times daily. liquid msm eye drops/ for floaters in the eyes     tamsulosin (FLOMAX) 0.4 MG CAPS capsule TAKE 1 CAPSULE (0.4 MG TOTAL) BY MOUTH DAILY. FOR URINE FLOW. 90 capsule 1   tiZANidine (ZANAFLEX) 4 MG tablet Take 0.5-1 tablets (2-4 mg  total) by mouth at bedtime as  needed for muscle spasms. 30 tablet 2   No current facility-administered medications on file prior to visit.    BP 118/68   Pulse 96   Temp 98 F (36.7 C) (Temporal)   Ht 5' 8"  (1.727 m)   Wt 208 lb 9.6 oz (94.6 kg)   SpO2 98%   BMI 31.72 kg/m  Objective:   Physical Exam Cardiovascular:     Rate and Rhythm: Normal rate and regular rhythm.  Pulmonary:     Effort: Pulmonary effort is normal.  Musculoskeletal:     Cervical back: Neck supple.  Skin:    General: Skin is warm and dry.  Neurological:     Mental Status: He is alert and oriented to person, place, and time.  Psychiatric:        Mood and Affect: Mood normal.           Assessment & Plan:   Problem List Items Addressed This Visit       Endocrine   DM type 2 (diabetes mellitus, type 2) ([place]) - Primary    Slightly worse than last visit, also is not following instructions regarding insulin use. Fortunately, he is not dropping below 60, however would like to see his low readings higher than in the 60's.  Increase Ozempic to 1 mg weekly. Will work with pharmacy for discounted price. Discussed to start taking insulin as recommended last visit: Novolin N 55 units in AM, 45 units in PM; Novolin R 25 units for breakfast/lunch, 15 units with dinner. Can reduce Novolin by 5 units each dose if needed.  Continue Farxiga 10 mg daily.  I recommended he update [place] via MyChart or phone call in 2-3 weeks. Work on diet.   Follow up in 3 months.       Relevant Orders   POCT glycosylated hemoglobin (Hb A1C) (Completed)       [name], NP

## 2021-09-21 DIAGNOSIS — Z794 Long term (current) use of insulin: Secondary | ICD-10-CM

## 2021-09-21 DIAGNOSIS — E1159 Type 2 diabetes mellitus with other circulatory complications: Secondary | ICD-10-CM

## 2021-09-21 DIAGNOSIS — E1122 Type 2 diabetes mellitus with diabetic chronic kidney disease: Secondary | ICD-10-CM

## 2021-09-21 NOTE — Telephone Encounter (Signed)
Mendel Ryder, do you mind updating patient once you know more? We increased his Ozempic to 1 mg weekly, he is on patient assistance.

## 2021-09-21 NOTE — Telephone Encounter (Signed)
Just doubled checked. He has the red .69m pens here.

## 2021-09-21 NOTE — Telephone Encounter (Signed)
Received the 85m prefilled pens, is this correct? If so let me know so I can call patient to come pick up.

## 2021-09-21 NOTE — Telephone Encounter (Signed)
I think all doses are 3 mL pens - does it say 0.25-0.5 mg dose or 1 mg dose on the box? (1 mg dose has blue-green accent on box, 0.5 mg dose has red accent)

## 2021-09-22 ENCOUNTER — Telehealth: Payer: Medicare Other

## 2021-09-22 ENCOUNTER — Other Ambulatory Visit: Payer: Self-pay | Admitting: Primary Care

## 2021-09-22 ENCOUNTER — Other Ambulatory Visit: Payer: Self-pay | Admitting: Interventional Cardiology

## 2021-09-22 DIAGNOSIS — E1122 Type 2 diabetes mellitus with diabetic chronic kidney disease: Secondary | ICD-10-CM

## 2021-09-22 NOTE — Telephone Encounter (Signed)
[name], was this patient's [place] sent through a discount program? He's saying that he's out, yet he has a 1 year Rx on file from December 2022.

## 2021-09-22 NOTE — Telephone Encounter (Signed)
Please call patient:  Notify him that it looks like he is refilling an old prescription of Farxiga at the 5 mg dose.  He should be taking Farxiga 10 mg, not 5 mg.  Have him call his pharmacy and request Farxiga 10 mg for refill, he has a refill on file.

## 2021-09-23 MED ORDER — DAPAGLIFLOZIN PROPANEDIOL 10 MG PO TABS: 10.0000 mg | ORAL_TABLET | Freq: Every day | ORAL | 1 refills | Status: DC

## 2021-09-23 NOTE — Telephone Encounter (Signed)
Yes he gets [place] through [employer]. If they are saying they are out of refills, you can eRx to their pharmacy:  [place], [address] [place] [address] Phone: [phone] Fax: [phone]

## 2021-09-23 NOTE — Telephone Encounter (Signed)
Called patient he will call pharmacy about getting refill from pharmacy. He will call if any questions.

## 2021-09-28 ENCOUNTER — Telehealth: Payer: Self-pay

## 2021-09-28 NOTE — Progress Notes (Signed)
    Chronic Care Management Pharmacy Assistant   Name: [name]  MRN: [id number] DOB: Nov 05, 1945  Reason for Encounter: CCM (Ozempic Dose Change PAP 2023)    Spoke with [employer] to ensure they received the dose change for patients Ozempic. Patient is now taking 1 mg weekly vs. 0.5 mg. The dosage change was received. Medication will be delivered to the office within 10-14 business days from today (10/04/2021). This will be the last shipment of 2023 for the patient.   [name], CPP notified  [name], [place] Clinical Pharmacy Assistant [phone]

## 2021-10-03 ENCOUNTER — Telehealth: Payer: Medicare Other

## 2021-10-05 ENCOUNTER — Ambulatory Visit (INDEPENDENT_AMBULATORY_CARE_PROVIDER_SITE_OTHER): Payer: Medicare Other

## 2021-10-05 DIAGNOSIS — I255 Ischemic cardiomyopathy: Secondary | ICD-10-CM | POA: Diagnosis not present

## 2021-10-05 LAB — CUP PACEART REMOTE DEVICE CHECK
Battery Remaining Longevity: 118 mo
Battery Voltage: 3.01 V
Brady Statistic RV Percent Paced: 0.01 %
Date Time Interrogation Session: 20231004001603
HighPow Impedance: 67 Ohm
Implantable Lead Implant Date: 20210105
Implantable Lead Location: 753860
Implantable Pulse Generator Implant Date: 20210105
Lead Channel Impedance Value: 380 Ohm
Lead Channel Impedance Value: 437 Ohm
Lead Channel Pacing Threshold Amplitude: 0.5 V
Lead Channel Pacing Threshold Pulse Width: 0.4 ms
Lead Channel Sensing Intrinsic Amplitude: 10.125 mV
Lead Channel Sensing Intrinsic Amplitude: 10.125 mV
Lead Channel Setting Pacing Amplitude: 2.5 V
Lead Channel Setting Pacing Pulse Width: 0.4 ms
Lead Channel Setting Sensing Sensitivity: 0.3 mV

## 2021-10-10 ENCOUNTER — Other Ambulatory Visit: Payer: Self-pay | Admitting: Interventional Cardiology

## 2021-10-12 ENCOUNTER — Telehealth: Payer: Self-pay | Admitting: Pharmacist

## 2021-10-12 ENCOUNTER — Ambulatory Visit: Payer: Medicare Other | Admitting: Pharmacist

## 2021-10-12 DIAGNOSIS — I10 Essential (primary) hypertension: Secondary | ICD-10-CM

## 2021-10-12 DIAGNOSIS — E160 Drug-induced hypoglycemia without coma: Secondary | ICD-10-CM

## 2021-10-12 DIAGNOSIS — Z794 Long term (current) use of insulin: Secondary | ICD-10-CM

## 2021-10-12 DIAGNOSIS — I25810 Atherosclerosis of coronary artery bypass graft(s) without angina pectoris: Secondary | ICD-10-CM

## 2021-10-12 DIAGNOSIS — I480 Paroxysmal atrial fibrillation: Secondary | ICD-10-CM

## 2021-10-12 DIAGNOSIS — E1159 Type 2 diabetes mellitus with other circulatory complications: Secondary | ICD-10-CM

## 2021-10-12 NOTE — Telephone Encounter (Signed)
Patient returned call. See CCM encounter

## 2021-10-12 NOTE — Telephone Encounter (Signed)
  Chronic Care Management   Outreach Note  10/12/2021 Name: [name] MRN: [id number] DOB: [birth year]  Referred by: [name], NP  Patient had a phone appointment scheduled with clinical pharmacist today.  An unsuccessful telephone outreach was attempted today. The patient was referred to the pharmacist for assistance with medications, care management and care coordination.   Patient will NOT be penalized in any way for missing a CCM appointment. The no-show fee does not apply.  If possible, a message was left to return call to: [phone] or to [facility].  [name], PharmD, BCACP Clinical Pharmacist [facility] [phone]

## 2021-10-12 NOTE — Progress Notes (Signed)
Chronic Care Management Pharmacy Note  10/12/2021 Name:  [name] MRN:  [id number] DOB:  07-13-1945  Summary: CCM F/U visit -DM: A1c 8.1% (09/2021) and worsening; pt has been off [place] for several weeks and just restarted yesterday; he is still taking Ozempic 0.5 mg as he has not received 1 mg dose yet from [place] report: 09/30/21 to 10/13/21. Sensor active: 82%  Time in range (70-180): 24% (goal > 70%)  High (>180): 76%  Low (< 70): 0% (goal < 4%)  GMI: 9.0%; Average glucose: 237 -Libre suggests worse control than recent A1c; pt is on suboptimal insulin regimen due to previous cost issues (he buys OTC Relion insulin for $25/vial) - he now qualifies for free insulin through [employer]  Recommendations/Changes made from today's visit: (approved per discussion with PCP 10/13/21) Stop Novolin N, Novolin R vials Start Tresiba 200 un/ml - 80 units once daily Start Novolog 25 units with breakfast, lunch and 15 units with dinner -obtaining insulin through [employer], will take several weeks for delivery  Plan: -[name] will contact [place] to ensure shipment of new insulin -Pharmacist follow up televisit scheduled for 3 weeks    Subjective: [name] is an 76 y.o. year old male who is a primary patient of [name], NP.  The CCM team was consulted for assistance with disease management and care coordination needs.    Engaged with patient by telephone for follow up visit in response to provider referral for pharmacy case management and/or care coordination services.   Consent to Services:  The patient was given information about Chronic Care Management services, agreed to services, and gave verbal consent prior to initiation of services.  Please see initial visit note for detailed documentation.   Patient Care Team: [name], NP as PCP - General (Internal Medicine) Jettie Booze, MD as PCP - Cardiology  (Cardiology) Marica Otter, [place] (Optometry) Lenord Fellers Cleaster Corin, [facility] as Pharmacist (Pharmacist)  Recent office visits: 09/19/21 NP [name] OV: DM - A1c 8.1%. Increase Ozempic to 1 mg. Take insulin as instructed. Update via mychart 2-3 wks.  08/03/21 Dr Copland OV: shoulder pain - steroid injection given.  05/25/21 NP [name]: DM - A1c 7.9%; getting FL2 and Ozempic approved  Recent consult visits: 04/27/21 Dr Irish Lack (Cardiology): HF, CAD. Refer to PharmD lipid clinic for [facility] visits: None in previous 6 months   Objective:  Lab Results  Component Value Date   CREATININE 1.08 05/04/2021   BUN 21 05/04/2021   GFR 75.44 12/07/2020   EGFR 72 05/04/2021   GFRNONAA >60 01/07/2019   GFRAA >60 01/07/2019   NA 139 05/04/2021   K 4.1 05/04/2021   CALCIUM 9.3 05/04/2021   CO2 22 05/04/2021   GLUCOSE 73 05/04/2021    Lab Results  Component Value Date/Time   HGBA1C 8.1 (A) 09/19/2021 07:40 AM   HGBA1C 7.9 (A) 05/25/2021 08:20 AM   HGBA1C 9.2 (H) 01/06/2019 01:51 AM   HGBA1C 9.5 (H) 09/13/2018 09:04 AM   GFR 75.44 12/07/2020 08:35 AM   GFR 59.59 (L) 11/26/2019 07:48 AM   MICROALBUR 1.6 11/04/2015 08:26 AM   MICROALBUR 0.2 12/11/2014 01:22 PM    Last diabetic Eye exam:  Lab Results  Component Value Date/Time   HMDIABEYEEXA Retinopathy (A) 11/26/2019 12:00 AM    Last diabetic Foot exam: No results found for: "HMDIABFOOTEX"   Lab Results  Component Value Date   CHOL  176 05/04/2021   HDL 44 05/04/2021   LDLCALC 125 (H) 05/04/2021   TRIG 35 05/04/2021   CHOLHDL 4.0 05/04/2021       Latest Ref Rng & Units 05/04/2021    9:12 AM 12/07/2020    8:35 AM 08/18/2020    9:31 AM  Hepatic Function  Total Protein 6.0 - 8.5 g/dL 7.0  7.3  7.0   Albumin 3.7 - 4.7 g/dL 4.4  4.1  4.6   AST 0 - 40 IU/L 33  48  16   ALT 0 - 44 IU/L 42  114  37   Alk Phosphatase 44 - 121 IU/L 107  114  69   Total Bilirubin 0.0 - 1.2 mg/dL 1.0  3.1  0.3   Bilirubin, Direct 0.00 - 0.40 mg/dL    0.14     Lab Results  Component Value Date/Time   TSH 1.529 01/06/2019 10:40 AM   TSH 0.748 06/18/2014 04:57 AM       Latest Ref Rng & Units 05/04/2021    9:12 AM 11/26/2019    7:48 AM 01/07/2019    2:30 AM  CBC  WBC 3.4 - 10.8 x10E3/uL 7.0  7.2  5.7   Hemoglobin 13.0 - 17.7 g/dL 13.9  13.8  12.9   Hematocrit 37.5 - 51.0 % 42.1  42.1  40.0   Platelets 150 - 450 x10E3/uL 193  207.0  263     No results found for: "VD25OH"  Clinical ASCVD: Yes  The ASCVD Risk score (Arnett DK, et al., 2019) failed to calculate for the following reasons:   The patient has a prior MI or stroke diagnosis       01/07/2021    3:34 PM 08/25/2020    8:20 AM 11/26/2019    7:17 AM  Depression screen PHQ 2/9  Decreased Interest 0 0 0  Down, Depressed, Hopeless 0 0 0  PHQ - 2 Score 0 0 0  Altered sleeping  0 0  Tired, decreased energy  0 0  Change in appetite  0 0  Feeling bad or failure about yourself   0 0  Trouble concentrating  0 0  Moving slowly or fidgety/restless  0 0  Suicidal thoughts  0 0  PHQ-9 Score  0 0  Difficult doing work/chores  Not difficult at all Not difficult at all    Social History   Tobacco Use  Smoking Status Never  Smokeless Tobacco Never   BP Readings from Last 3 Encounters:  09/19/21 118/68  08/03/21 110/60  05/25/21 110/60   Pulse Readings from Last 3 Encounters:  09/19/21 96  08/03/21 97  05/25/21 74   Wt Readings from Last 3 Encounters:  09/19/21 208 lb 9.6 oz (94.6 kg)  08/03/21 207 lb 6 oz (94.1 kg)  05/25/21 213 lb (96.6 kg)   BMI Readings from Last 3 Encounters:  09/19/21 31.72 kg/m  08/03/21 31.53 kg/m  05/25/21 32.39 kg/m    Assessment/Interventions: Review of patient past medical history, allergies, medications, health status, including review of consultants reports, laboratory and other test data, was performed as part of comprehensive evaluation and provision of chronic care management services.   SDOH:  (Social Determinants of Health)  assessments and interventions performed: No SDOH Interventions    Flowsheet Row Chronic Care Management from 12/21/2020 in [place] at [place] from 09/13/2018 in [place] at [place] from 08/24/2016 in [place] at [place]  SDOH Interventions  Depression Interventions/Treatment  -- VOZ3-6 Score <4 Follow-up Not Indicated --  [pt is being referred to PCP for further evaluation]  Financial Strain Interventions Other (Comment)  [Farxiga - PAP,  Eliquis - Pt to let us know when he goes into coverage gap in 2023.] -- --      SDOH Screenings   Food Insecurity: No Food Insecurity (01/07/2021)  Housing: Low Risk  (01/07/2021)  Transportation Needs: No Transportation Needs (01/07/2021)  Alcohol Screen: Low Risk  (01/07/2021)  Depression (PHQ2-9): Low Risk  (01/07/2021)  Financial Resource Strain: Low Risk  (01/07/2021)  Recent Concern: Financial Resource Strain - High Risk (12/21/2020)  Physical Activity: Sufficiently Active (01/07/2021)  Social Connections: Socially Integrated (01/07/2021)  Stress: No Stress Concern Present (01/07/2021)  Tobacco Use: Low Risk  (09/19/2021)    [place]  Allergies  Allergen Reactions   Lipitor [Atorvastatin] Other (See Comments)    Myopathy Spring 2006   Pravastatin Other (See Comments)    LEG CRAMPS   Simvastatin Other (See Comments)    LEG CRAMPS    Medications Reviewed Today     Reviewed by Charlton Haws, [facility] (Pharmacist) on 10/14/21 at 1246  Med List Status: <None>   Medication Order Taking? Sig Documenting Provider Last Dose Status Informant  acetaminophen (TYLENOL) 325 MG tablet [id number] Yes Take 1-2 tablets (325-650 mg total) by mouth every 4 (four) hours as needed for mild pain. Shirley Friar, PA-C Taking Active Self  carvedilol (COREG) 6.25 MG tablet [id number] Yes TAKE 1 TABLET BY MOUTH TWICE A DAY WITH MEAL Jettie Booze, MD Taking Active   Continuous  Blood Gluc Receiver (FREESTYLE LIBRE 2 READER) [place] [id number] Yes Use to check blood sugars. [name], NP Taking Active   Continuous Blood Gluc Sensor (FREESTYLE LIBRE 2 SENSOR) [place] [id number] Yes Apply every 14 days to check blood sugars. [name], NP Taking Active   dapagliflozin propanediol (FARXIGA) 10 MG TABS tablet [id number] Yes Take 1 tablet (10 mg total) by mouth daily before breakfast. For diabetes. [name], NP Taking Active            Med Note Malena Catholic Oct 12, 2021 11:42 AM) AZ&Me PAP  ELIQUIS 5 MG TABS tablet [id number] Yes TAKE 1 TABLET BY MOUTH TWICE A DAY Jettie Booze, MD Taking Active   insulin aspart (NOVOLOG FLEXPEN) 100 UNIT/ML FlexPen [id number] Yes Inject 25 units with breakfast and lunch, and 15 units with dinner. Via Novo Cares PAP [name], NP  Active            Med Note Charlton Haws   Fri Oct 14, 2021 12:46 PM)    insulin degludec (TRESIBA FLEXTOUCH) 200 UNIT/ML FlexTouch Pen [id number] Yes Inject 80 Units into the skin daily. Via Novo Cares PAP Alma Friendly K, NP  Active   Insulin Pen Needle 31G X 8 MM MISC [id number] Yes Use as directed with Lantus. [name], NP Taking Active Self  isosorbide mononitrate (IMDUR) 30 MG 24 hr tablet [id number] Yes TAKE 1 TABLET BY MOUTH EVERY DAY Jettie Booze, MD Taking Active   lisinopril (ZESTRIL) 5 MG tablet [id number] Yes TAKE 1 TABLET (5 MG TOTAL) BY MOUTH DAILY. PLEASE KEEP UPCOMING APPT FOR FUTURE REFILLS. Jettie Booze, MD Taking Active   nitroGLYCERIN (NITROSTAT) 0.4 MG SL tablet [id number] Yes Place 1 tablet (0.4 mg total) under the tongue every 5 (five) minutes as needed for chest pain. Irish Lack,  Charlann Lange, MD Taking Active   OVER THE COUNTER MEDICATION [id number] Yes Place 1 drop into both eyes 2 (two) times daily. liquid msm eye drops/ for floaters in the eyes [provider] Taking Active Self  Semaglutide, 1 MG/DOSE,  (OZEMPIC, 1 MG/DOSE,) 4 MG/3ML SOPN [id number] No Inject 1 mg into the skin once a week. Via [employer] PAP  Patient not taking: Reported on 10/14/2021   [provider] Not Taking Active            Med Note Charlton Haws   Fri Oct 14, 2021 12:45 PM) Still has 0.5 mg dose, waiting on 1 mg dose delivery from [facility] ~mid October  tamsulosin (FLOMAX) 0.4 MG CAPS capsule [id number] Yes TAKE 1 CAPSULE (0.4 MG TOTAL) BY MOUTH DAILY. FOR URINE FLOW. [name], NP Taking Active   tiZANidine (ZANAFLEX) 4 MG tablet [id number] Yes Take 0.5-1 tablets (2-4 mg total) by mouth at bedtime as needed for muscle spasms. Owens Loffler, MD Taking Active             Patient Active Problem List   Diagnosis Date Noted   Syncope 04/11/2019   ICD (implantable cardioverter-defibrillator) in place 04/11/2019   Unstable angina ([place]) 01/05/2019   Diarrhea 12/26/2018   Allergic rhinitis 06/21/2018   NSTEMI (non-ST elevated myocardial infarction) ([place]) 08/02/2015   Insomnia 07/21/2014   Hx of CABG 07/19/2014   Nonsustained ventricular tachycardia ([place]) 06/23/2014   Cardiomyopathy, ischemic    CAD (coronary artery disease) of artery bypass graft    Junctional bradycardia    Coronary artery disease of native heart with stable angina pectoris ([place]) 05/12/2014   Hyperlipidemia 04/21/2013   NASH (nonalcoholic steatohepatitis) 09/06/2011   Rotator cuff tendinitis 07/28/2011   Hypertension    BPH (benign prostatic hyperplasia) 03/11/2011   IBS (irritable bowel syndrome) 03/11/2011   Bursitis of hip 03/11/2011   DM type 2 (diabetes mellitus, type 2) ([place]) 01/21/2009   ATRIAL FIBRILLATION, PAROXYSMAL 01/21/2009    Immunization History  Administered Date(s) Administered   Fluad Quad(high Dose 65+) 10/28/2019, 09/19/2021   Influenza Split 11/02/2013   Influenza,inj,Quad PF,6+ Mos 11/08/2015, 10/24/2016, 09/07/2017, 09/17/2018   Influenza-Unspecified 11/02/2012   PFIZER(Purple  Top)SARS-COV-2 Vaccination 02/28/2019, 03/26/2019, 10/28/2019   Pneumococcal Conjugate-13 12/11/2014   Pneumococcal Polysaccharide-23 05/25/2005, 08/24/2016   Tdap 01/16/2011   Zoster Recombinat (Shingrix) 09/21/2018, 12/18/2018   Zoster, Live 03/02/2013    Conditions to be addressed/monitored:  Hypertension, Hyperlipidemia, Diabetes, and Atrial Fibrillation  Care Plan : [place]  Updates made by Charlton Haws, [place] since 10/14/2021 12:00 AM     Problem: Hypertension, Hyperlipidemia, Diabetes, and Atrial Fibrillation      Long-Range Goal: Disease mgmt   Start Date: 12/21/2020  Expected End Date: 10/15/2022  This Visit's Progress: On track  Priority: High  Note:   Current Barriers:  Suboptimal therapeutic regimen for diabetes  Pharmacist Clinical Goal(s):  Patient will adhere to plan to optimize therapeutic regimen for diabetes as evidenced by report of adherence to recommended medication management changes through collaboration with PharmD and provider.   Interventions: 1:1 collaboration with [name], NP regarding development and update of comprehensive plan of care as evidenced by provider attestation and co-signature Inter-disciplinary care team collaboration (see longitudinal plan of care) Comprehensive medication review performed; medication list updated in electronic medical record  Hypertension / Heart Failure (BP goal <130/80) -Controlled - per clinic readings -Last ejection fraction: 35-40% (Date: 01/2019) -HF type: Systolic; NYHA Class:  I (no actitivty limitation) -Current treatment: Carvedilol 6.25 mg BID Isosorbide Mononitrate 30 mg daily Lisinopril 5 mg daily Farxiga 10 mg daily -Medications previously tried: none  -Current home readings: none reported -Denies hypotensive/hypertensive symptoms; Denies chest pain, SOB, dizziness, falls. -Educated on importance of daily adherence; reviewed refill history -Counseled to monitor BP  at home if symptomatic, document, and provide log at future appointments -Recommended to continue current medication  Hyperlipidemia: (LDL goal < 70) -Not ideally controlled - LDL 125 (05/2021) off of cholesterol meds -Hx CAD (CABG 2016); follows with cardiology (Dr Irish Lack); he has discussed PSCK9-inhibitors with cardiology pharmacist and deferred due to multiple injections -No aspirin due to Eliquis -Current treatment: Nitroglycerin 0.4 mg SL PRN -Medications previously tried: pravastatin 20 mg, rosuvastatin 10 mg TIW, simvastatin 10 mg TIW, atorvastatin, ezetimibe -Educated on Cholesterol goals;  -Consider Leqvio  Diabetes (A1c goal <7%) -Uncontrolled - A1c 8.1% (09/2021), has been worsening since 12/2020 when it was 7.5%; pt was out of [place] for several weeks and  -Current home glucose readings: Reviewed AGP report: 09/30/21 to 10/13/21. Sensor active: 82%  Time in range (70-180): 24% (goal > 70%)  High (>180): 76%  Low (< 70): 0% (goal < 4%)  GMI: 9.0%; Average glucose: 237  -Reports 3 low sugars in the last 3 months -Current medications: Farxiga 10 mg daily - Appropriate, Query Effective Novolin N -  55 u AM, 45 u PM -Appropriate, Query Effective Novolin R - 25 u with B/L, 15 u w/ dinner -Appropriate, Query Effective Ozempic 0.5 mg weekly -Appropriate, Query Effective Freestyle Libre 2 (phone) - Appropriate, Effective, Safe, Accessible -Medications previously tried: glipizide (hypoglycemia) -Per AGP DM control is worse than A1c would suggest; his insulin regimen is suboptimal - he is buying OTC Relion insulin for $25 per vial, he now qualifies for free insulin through [employer] so we will optimize insulin regimen -Recommendations (approved per discussion with PCP 10/12) Stop Novolin N, Novolin R Start Tresiba 200 un/ml - 80 units once daily Start Novolog 25 units with breakfast, lunch and 15 units with dinner  Atrial Fibrillation (Goal: prevent stroke and major  bleeding) -Followed by cardiology -CHADSVASC: 6 -Reviewed adherence: Eliquis PDC 66% less than ideal -Current treatment: Carvedilol 6.25 mg BID - Appropriate, Effective, Safe, Accessible Eliquis 5 mg BID -Appropriate, Effective, Safe, Accessible -Medications previously tried: none reported -Home BP and HR readings: none reported -Counseled on increased risk of stroke due to Afib and benefits of anticoagulation for stroke prevention; importance of adherence to anticoagulant exactly as prescribed; avoidance of NSAIDs due to increased bleeding risk with anticoagulants; -Inquired about Eliquis affordability - Pr reports $49/30 day supply; He usually goes into donut hole towards end of the year and rations the Eliquis at that time. He takes it once daily until he can afford it again. -Discussed NSAID use, he takes Advil before golf 2-3 days per week. Tylenol ineffective. Discussed bleeding risk and limiting use to lowest dose and to report any s/s of bleeding. -Recommended to continue current medication  BPH (Goal: Improve symptoms) -Controlled - works well for patient -Pt affirms adherence -Current treatment  Tamsulosin 0.4 mg daily  -Medications previously tried: none reported  -Recommended to continue current medication  Patient Goals/Self-Care Activities Patient will:  - take medications as prescribed as evidenced by patient report and record review focus on medication adherence by routine check glucose using CGM, document, and provide at future appointments       Medication Assistance:  [employer] -  Ozempic (approved through 01/01/22) AZ&Me - [name] (approved through 01/01/22)  Compliance/Adherence/Medication fill history: Care Gaps: Urine ACR Foot exam  Star-Rating Drugs: Lisinopril - PDC 94%  Medication Access: Within the past 30 days, how often has patient missed a dose of medication? 0 Is a pillbox or other method used to improve adherence? Yes  Factors that may  affect medication adherence?  Confusion with multiple pharmacies / PAP programs Are meds synced by current pharmacy? No  Are meds delivered by current pharmacy? No  Does patient experience delays in picking up medications due to transportation concerns? No   [facility] Services Reviewed: Is patient disadvantaged to use [facility]?: No  Current Rx insurance plan: [facility] Name and location of Current pharmacy[facility]  [id number] [place] [address]Phone: [phone]: [phone] [facility] services reviewed with patient today?: No  Patient requests to transfer care to [facility]?: No  Reason patient declined to change pharmacies: Not mentioned at this visit   Care Plan and Follow Up Patient Decision:  Patient agrees to Care Plan and Follow-up.  Plan: Telephone follow up appointment with care management team member scheduled for:  3 weeks  LCharlene Brooke PharmD, BCACP Clinical Pharmacist [phone]

## 2021-10-13 ENCOUNTER — Telehealth: Payer: Medicare Other

## 2021-10-13 MED ORDER — NOVOLOG FLEXPEN 100 UNIT/ML ~~LOC~~ SOPN: PEN_INJECTOR | SUBCUTANEOUS | 0 refills | Status: DC

## 2021-10-13 MED ORDER — TRESIBA FLEXTOUCH 200 UNIT/ML ~~LOC~~ SOPN: 80.0000 [IU] | PEN_INJECTOR | Freq: Every day | SUBCUTANEOUS | 0 refills | Status: DC

## 2021-10-14 NOTE — Progress Notes (Signed)
Remote ICD transmission.   

## 2021-10-14 NOTE — Patient Instructions (Signed)
Visit Information  Phone number for Pharmacist: [phone]   Goals Addressed   None     Care Plan : [place]  Updates made by Charlton Haws, [place] since 10/14/2021 12:00 AM     Problem: Hypertension, Hyperlipidemia, Diabetes, and Atrial Fibrillation      Long-Range Goal: Disease mgmt   Start Date: 12/21/2020  Expected End Date: 10/15/2022  This Visit's Progress: On track  Priority: High  Note:   Current Barriers:  Suboptimal therapeutic regimen for diabetes  Pharmacist Clinical Goal(s):  Patient will adhere to plan to optimize therapeutic regimen for diabetes as evidenced by report of adherence to recommended medication management changes through collaboration with PharmD and provider.   Interventions: 1:1 collaboration with [name], NP regarding development and update of comprehensive plan of care as evidenced by provider attestation and co-signature Inter-disciplinary care team collaboration (see longitudinal plan of care) Comprehensive medication review performed; medication list updated in electronic medical record  Hypertension / Heart Failure (BP goal <130/80) -Controlled - per clinic readings -Last ejection fraction: 35-40% (Date: 01/2019) -HF type: Systolic; NYHA Class: I (no actitivty limitation) -Current treatment: Carvedilol 6.25 mg BID Isosorbide Mononitrate 30 mg daily Lisinopril 5 mg daily Farxiga 10 mg daily -Medications previously tried: none  -Current home readings: none reported -Denies hypotensive/hypertensive symptoms; Denies chest pain, SOB, dizziness, falls. -Educated on importance of daily adherence; reviewed refill history -Counseled to monitor BP at home if symptomatic, document, and provide log at future appointments -Recommended to continue current medication  Hyperlipidemia: (LDL goal < 70) -Not ideally controlled - LDL 125 (05/2021) off of cholesterol meds -Hx CAD (CABG 2016); follows with cardiology (Dr  Irish Lack); he has discussed PSCK9-inhibitors with cardiology pharmacist and deferred due to multiple injections -No aspirin due to Eliquis -Current treatment: Nitroglycerin 0.4 mg SL PRN -Medications previously tried: pravastatin 20 mg, rosuvastatin 10 mg TIW, simvastatin 10 mg TIW, atorvastatin, ezetimibe -Educated on Cholesterol goals;  -Consider Leqvio  Diabetes (A1c goal <7%) -Uncontrolled - A1c 8.1% (09/2021), has been worsening since 12/2020 when it was 7.5%; pt was out of [place] for several weeks and  -Current home glucose readings: Reviewed AGP report: 09/30/21 to 10/13/21. Sensor active: 82%  Time in range (70-180): 24% (goal > 70%)  High (>180): 76%  Low (< 70): 0% (goal < 4%)  GMI: 9.0%; Average glucose: 237  -Reports 3 low sugars in the last 3 months -Current medications: Farxiga 10 mg daily - Appropriate, Query Effective Novolin N -  55 u AM, 45 u PM -Appropriate, Query Effective Novolin R - 25 u with B/L, 15 u w/ dinner -Appropriate, Query Effective Ozempic 0.5 mg weekly -Appropriate, Query Effective Freestyle Libre 2 (phone) - Appropriate, Effective, Safe, Accessible -Medications previously tried: glipizide (hypoglycemia) -Per AGP DM control is worse than A1c would suggest; his insulin regimen is suboptimal - he is buying OTC Relion insulin for $25 per vial, he now qualifies for free insulin through [employer] so we will optimize insulin regimen -Recommendations (approved per discussion with PCP 10/12) Stop Novolin N, Novolin R Start Tresiba 200 un/ml - 80 units once daily Start Novolog 25 units with breakfast, lunch and 15 units with dinner  Atrial Fibrillation (Goal: prevent stroke and major bleeding) -Followed by cardiology -CHADSVASC: 6 -Reviewed adherence: Eliquis PDC 66% less than ideal -Current treatment: Carvedilol 6.25 mg BID - Appropriate, Effective, Safe, Accessible Eliquis 5 mg BID -Appropriate, Effective, Safe, Accessible -Medications previously tried:  none reported -Home BP and HR  readings: none reported -Counseled on increased risk of stroke due to Afib and benefits of anticoagulation for stroke prevention; importance of adherence to anticoagulant exactly as prescribed; avoidance of NSAIDs due to increased bleeding risk with anticoagulants; -Inquired about Eliquis affordability - Pr reports $49/30 day supply; He usually goes into donut hole towards end of the year and rations the Eliquis at that time. He takes it once daily until he can afford it again. -Discussed NSAID use, he takes Advil before golf 2-3 days per week. Tylenol ineffective. Discussed bleeding risk and limiting use to lowest dose and to report any s/s of bleeding. -Recommended to continue current medication  BPH (Goal: Improve symptoms) -Controlled - works well for patient -Pt affirms adherence -Current treatment  Tamsulosin 0.4 mg daily  -Medications previously tried: none reported  -Recommended to continue current medication  Patient Goals/Self-Care Activities Patient will:  - take medications as prescribed as evidenced by patient report and record review focus on medication adherence by routine check glucose using CGM, document, and provide at future appointments       Patient verbalizes understanding of instructions and care plan provided today and agrees to view in [place]. Active MyChart status and patient understanding of how to access instructions and care plan via MyChart confirmed with patient.    Telephone follow up appointment with pharmacy team member scheduled for: 3 weeks  [name], PharmD, BCACP Clinical Pharmacist [place] [facility] at [facility] [phone]

## 2021-10-17 NOTE — Telephone Encounter (Signed)
Pt called in requesting a call back [phone]

## 2021-10-18 ENCOUNTER — Other Ambulatory Visit: Payer: Self-pay | Admitting: Primary Care

## 2021-10-18 DIAGNOSIS — E1159 Type 2 diabetes mellitus with other circulatory complications: Secondary | ICD-10-CM

## 2021-10-18 DIAGNOSIS — E160 Drug-induced hypoglycemia without coma: Secondary | ICD-10-CM

## 2021-10-18 MED ORDER — FREESTYLE LIBRE 2 SENSOR MISC: 1 refills | Status: DC

## 2021-10-18 NOTE — Telephone Encounter (Signed)
[facility] looks like [facility] will need a new prescription. Last eRx from 05/11/21 (3 mo supply w/ 1 RF), filled in May and August. Ok to send new Rx?

## 2021-10-31 ENCOUNTER — Telehealth: Payer: Self-pay

## 2021-10-31 NOTE — Progress Notes (Signed)
    Chronic Care Management Pharmacy Assistant   Name: [name]  MRN: [id number] DOB: 07/29/1945  Reason for Encounter: CCM (Appointment Reminder)   Medications: Outpatient Encounter Medications as of 10/31/2021  Medication Sig Note   acetaminophen (TYLENOL) 325 MG tablet Take 1-2 tablets (325-650 mg total) by mouth every 4 (four) hours as needed for mild pain.    carvedilol (COREG) 6.25 MG tablet TAKE 1 TABLET BY MOUTH TWICE A DAY WITH MEAL    Continuous Blood Gluc Receiver (FREESTYLE LIBRE 2 READER) DEVI Use to check blood sugars.    Continuous Blood Gluc Sensor (FREESTYLE LIBRE 2 SENSOR) MISC Apply every 14 days to check blood sugars.    dapagliflozin propanediol (FARXIGA) 10 MG TABS tablet Take 1 tablet (10 mg total) by mouth daily before breakfast. For diabetes. 10/12/2021: AZ&Me PAP   ELIQUIS 5 MG TABS tablet TAKE 1 TABLET BY MOUTH TWICE A DAY    insulin aspart (NOVOLOG FLEXPEN) 100 UNIT/ML FlexPen Inject 25 units with breakfast and lunch, and 15 units with dinner. Via Novo Cares PAP    insulin degludec (TRESIBA FLEXTOUCH) 200 UNIT/ML FlexTouch Pen Inject 80 Units into the skin daily. Via Novo Cares PAP    Insulin Pen Needle 31G X 8 MM MISC Use as directed with Lantus.    isosorbide mononitrate (IMDUR) 30 MG 24 hr tablet TAKE 1 TABLET BY MOUTH EVERY DAY    lisinopril (ZESTRIL) 5 MG tablet TAKE 1 TABLET (5 MG TOTAL) BY MOUTH DAILY. PLEASE KEEP UPCOMING APPT FOR FUTURE REFILLS.    nitroGLYCERIN (NITROSTAT) 0.4 MG SL tablet Place 1 tablet (0.4 mg total) under the tongue every 5 (five) minutes as needed for chest pain.    OVER THE COUNTER MEDICATION Place 1 drop into both eyes 2 (two) times daily. liquid msm eye drops/ for floaters in the eyes    Semaglutide, 1 MG/DOSE, (OZEMPIC, 1 MG/DOSE,) 4 MG/3ML SOPN Inject 1 mg into the skin once a week. Via [employer] PAP (Patient not taking: Reported on 10/14/2021) 10/14/2021: Still has 0.5 mg dose, waiting on 1 mg dose delivery from [employer]  ~mid October   tamsulosin (FLOMAX) 0.4 MG CAPS capsule TAKE 1 CAPSULE (0.4 MG TOTAL) BY MOUTH DAILY. FOR URINE FLOW.    tiZANidine (ZANAFLEX) 4 MG tablet Take 0.5-1 tablets (2-4 mg total) by mouth at bedtime as needed for muscle spasms.    No facility-administered encounter medications on file as of 10/31/2021.   [name] was contacted to remind of upcoming telephone visit with [name] on 11/03/2021 at 10:00. Patient was reminded to have any blood glucose and blood pressure readings available for review at appointment.   Message was left reminding patient of appointment.  CCM referral has been placed prior to visit?  No   Star Rating Drugs: Medication:  Last Fill: Day Supply Lisinopril 5 mg  10/10/21 90 Ozempic 1 mg  PAP  [name], CPP notified  [name], [place] Pharmacy Assistant [phone]

## 2021-11-03 ENCOUNTER — Ambulatory Visit: Payer: Medicare Other | Admitting: Pharmacist

## 2021-11-03 ENCOUNTER — Telehealth: Payer: Self-pay | Admitting: Primary Care

## 2021-11-03 DIAGNOSIS — Z794 Long term (current) use of insulin: Secondary | ICD-10-CM

## 2021-11-03 DIAGNOSIS — E160 Drug-induced hypoglycemia without coma: Secondary | ICD-10-CM

## 2021-11-03 DIAGNOSIS — I25810 Atherosclerosis of coronary artery bypass graft(s) without angina pectoris: Secondary | ICD-10-CM

## 2021-11-03 DIAGNOSIS — I10 Essential (primary) hypertension: Secondary | ICD-10-CM

## 2021-11-03 DIAGNOSIS — I480 Paroxysmal atrial fibrillation: Secondary | ICD-10-CM

## 2021-11-03 NOTE — Progress Notes (Signed)
Chronic Care Management Pharmacy Note  11/03/2021 Name:  [name] MRN:  [id number] DOB:  Mar 17, 1945  Summary: CCM F/U visit -DM: A1c 8.1% (09/2021) and worsening; pt has resumed [place] and increased Ozempic 0.5 mg x 2 injections weekly since last visit; he also has reduced Novolin N to 40 units AM, 35 units PM in light of lower glucose readings; he has some some readings < 70 overnight -We are in process of switching insulin to Tresiba/Novolog, waiting on Novo Cares shipment Reviewed AGP report: 10/21/21 to 11/03/21. Sensor active: 90%  Time in range (70-180): 38% (improved from 24%)  High (180-250): 41%  Very high (>250): 20%  Low (< 70): 1% (goal < 4%)  GMI: 8.0% (improved from 9%); Average glucose: 196  Recommendations/Changes made from today's visit: -While awaiting Tresiba/Novolog shipment- advised to reduce PM dose of Novolin N to 30 units  Plan: -[place] will contact [facility] to ensure shipment of new insulin -Pharmacist follow up televisit scheduled for 3 weeks -PCP appt 12/20/21    Subjective: [name] is an 76 y.o. year old male who is a primary patient of [name], NP.  The CCM team was consulted for assistance with disease management and care coordination needs.    Engaged with patient by telephone for follow up visit in response to provider referral for pharmacy case management and/or care coordination services.   Consent to Services:  The patient was given information about Chronic Care Management services, agreed to services, and gave verbal consent prior to initiation of services.  Please see initial visit note for detailed documentation.   Patient Care Team: [name], NP as PCP - General (Internal Medicine) Jettie Booze, MD as PCP - Cardiology (Cardiology) Marica Otter, [place] (Optometry) Lenord Fellers Cleaster Corin, [facility] as Pharmacist (Pharmacist)  Recent office visits: 09/19/21 NP [name] OV: DM - A1c 8.1%.  Increase Ozempic to 1 mg. Take insulin as instructed. Update via mychart 2-3 wks.  08/03/21 Dr Copland OV: shoulder pain - steroid injection given.  05/25/21 NP [name]: DM - A1c 7.9%; getting FL2 and Ozempic approved  Recent consult visits: 04/27/21 Dr Irish Lack (Cardiology): HF, CAD. Refer to PharmD lipid clinic for [facility] visits: None in previous 6 months   Objective:  Lab Results  Component Value Date   CREATININE 1.08 05/04/2021   BUN 21 05/04/2021   GFR 75.44 12/07/2020   EGFR 72 05/04/2021   GFRNONAA >60 01/07/2019   GFRAA >60 01/07/2019   NA 139 05/04/2021   K 4.1 05/04/2021   CALCIUM 9.3 05/04/2021   CO2 22 05/04/2021   GLUCOSE 73 05/04/2021    Lab Results  Component Value Date/Time   HGBA1C 8.1 (A) 09/19/2021 07:40 AM   HGBA1C 7.9 (A) 05/25/2021 08:20 AM   HGBA1C 9.2 (H) 01/06/2019 01:51 AM   HGBA1C 9.5 (H) 09/13/2018 09:04 AM   GFR 75.44 12/07/2020 08:35 AM   GFR 59.59 (L) 11/26/2019 07:48 AM   MICROALBUR 1.6 11/04/2015 08:26 AM   MICROALBUR 0.2 12/11/2014 01:22 PM    Last diabetic Eye exam:  Lab Results  Component Value Date/Time   HMDIABEYEEXA Retinopathy (A) 11/26/2019 12:00 AM    Last diabetic Foot exam: No results found for: "HMDIABFOOTEX"   Lab Results  Component Value Date   CHOL 176 05/04/2021   HDL 44 05/04/2021   LDLCALC 125 (H) 05/04/2021   TRIG 35 05/04/2021   CHOLHDL 4.0 05/04/2021  Latest Ref Rng & Units 05/04/2021    9:12 AM 12/07/2020    8:35 AM 08/18/2020    9:31 AM  Hepatic Function  Total Protein 6.0 - 8.5 g/dL 7.0  7.3  7.0   Albumin 3.7 - 4.7 g/dL 4.4  4.1  4.6   AST 0 - 40 IU/L 33  48  16   ALT 0 - 44 IU/L 42  114  37   Alk Phosphatase 44 - 121 IU/L 107  114  69   Total Bilirubin 0.0 - 1.2 mg/dL 1.0  3.1  0.3   Bilirubin, Direct 0.00 - 0.40 mg/dL   0.14     Lab Results  Component Value Date/Time   TSH 1.529 01/06/2019 10:40 AM   TSH 0.748 06/18/2014 04:57 AM       Latest Ref Rng & Units 05/04/2021     9:12 AM 11/26/2019    7:48 AM 01/07/2019    2:30 AM  CBC  WBC 3.4 - 10.8 x10E3/uL 7.0  7.2  5.7   Hemoglobin 13.0 - 17.7 g/dL 13.9  13.8  12.9   Hematocrit 37.5 - 51.0 % 42.1  42.1  40.0   Platelets 150 - 450 x10E3/uL 193  207.0  263     No results found for: "VD25OH"  Clinical ASCVD: Yes  The ASCVD Risk score (Arnett DK, et al., 2019) failed to calculate for the following reasons:   The patient has a prior MI or stroke diagnosis       01/07/2021    3:34 PM 08/25/2020    8:20 AM 11/26/2019    7:17 AM  Depression screen PHQ 2/9  Decreased Interest 0 0 0  Down, Depressed, Hopeless 0 0 0  PHQ - 2 Score 0 0 0  Altered sleeping  0 0  Tired, decreased energy  0 0  Change in appetite  0 0  Feeling bad or failure about yourself   0 0  Trouble concentrating  0 0  Moving slowly or fidgety/restless  0 0  Suicidal thoughts  0 0  PHQ-9 Score  0 0  Difficult doing work/chores  Not difficult at all Not difficult at all    Social History   Tobacco Use  Smoking Status Never  Smokeless Tobacco Never   BP Readings from Last 3 Encounters:  09/19/21 118/68  08/03/21 110/60  05/25/21 110/60   Pulse Readings from Last 3 Encounters:  09/19/21 96  08/03/21 97  05/25/21 74   Wt Readings from Last 3 Encounters:  09/19/21 208 lb 9.6 oz (94.6 kg)  08/03/21 207 lb 6 oz (94.1 kg)  05/25/21 213 lb (96.6 kg)   BMI Readings from Last 3 Encounters:  09/19/21 31.72 kg/m  08/03/21 31.53 kg/m  05/25/21 32.39 kg/m    Assessment/Interventions: Review of patient past medical history, allergies, medications, health status, including review of consultants reports, laboratory and other test data, was performed as part of comprehensive evaluation and provision of chronic care management services.   SDOH:  (Social Determinants of Health) assessments and interventions performed: No SDOH Interventions    Flowsheet Row Chronic Care Management from 12/21/2020 in [place] at [place] from 09/13/2018 in [place] at [place] from 08/24/2016 in [place] at [place]  SDOH Interventions     Depression Interventions/Treatment  -- WPV9-4 Score <4 Follow-up Not Indicated --  [pt is being referred to PCP for further evaluation]  Financial Strain Interventions Other (Comment)  [  Farxiga - PAP,  Eliquis - Pt to let us know when he goes into coverage gap in 2023.] -- --      SDOH Screenings   Food Insecurity: No Food Insecurity (01/07/2021)  Housing: Low Risk  (01/07/2021)  Transportation Needs: No Transportation Needs (01/07/2021)  Alcohol Screen: Low Risk  (01/07/2021)  Depression (PHQ2-9): Low Risk  (01/07/2021)  Financial Resource Strain: Low Risk  (01/07/2021)  Recent Concern: Financial Resource Strain - High Risk (12/21/2020)  Physical Activity: Sufficiently Active (01/07/2021)  Social Connections: Socially Integrated (01/07/2021)  Stress: No Stress Concern Present (01/07/2021)  Tobacco Use: Low Risk  (09/19/2021)    [place]  Allergies  Allergen Reactions   Lipitor [Atorvastatin] Other (See Comments)    Myopathy Spring 2006   Pravastatin Other (See Comments)    LEG CRAMPS   Simvastatin Other (See Comments)    LEG CRAMPS    Medications Reviewed Today     Reviewed by Charlton Haws, [facility] (Pharmacist) on 10/14/21 at 1246  Med List Status: <None>   Medication Order Taking? Sig Documenting Provider Last Dose Status Informant  acetaminophen (TYLENOL) 325 MG tablet [id number] Yes Take 1-2 tablets (325-650 mg total) by mouth every 4 (four) hours as needed for mild pain. Shirley Friar, PA-C Taking Active Self  carvedilol (COREG) 6.25 MG tablet [id number] Yes TAKE 1 TABLET BY MOUTH TWICE A DAY WITH MEAL Jettie Booze, MD Taking Active   Continuous Blood Gluc Receiver (FREESTYLE LIBRE 2 READER) DEVI [id number] Yes Use to check blood sugars. [name], NP Taking Active   Continuous Blood Gluc Sensor  (FREESTYLE LIBRE 2 SENSOR) [place] [id number] Yes Apply every 14 days to check blood sugars. [name], NP Taking Active   dapagliflozin propanediol (FARXIGA) 10 MG TABS tablet [id number] Yes Take 1 tablet (10 mg total) by mouth daily before breakfast. For diabetes. [name], NP Taking Active            Med Note Malena Catholic Oct 12, 2021 11:42 AM) AZ&Me PAP  ELIQUIS 5 MG TABS tablet [id number] Yes TAKE 1 TABLET BY MOUTH TWICE A DAY Jettie Booze, MD Taking Active   insulin aspart (NOVOLOG FLEXPEN) 100 UNIT/ML FlexPen [id number] Yes Inject 25 units with breakfast and lunch, and 15 units with dinner. Via Novo Cares PAP [name], NP  Active            Med Note Charlton Haws   Fri Oct 14, 2021 12:46 PM)    insulin degludec (TRESIBA FLEXTOUCH) 200 UNIT/ML FlexTouch Pen [id number] Yes Inject 80 Units into the skin daily. Via Novo Cares PAP Alma Friendly K, NP  Active   Insulin Pen Needle 31G X 8 MM MISC [id number] Yes Use as directed with Lantus. [name], NP Taking Active Self  isosorbide mononitrate (IMDUR) 30 MG 24 hr tablet [id number] Yes TAKE 1 TABLET BY MOUTH EVERY DAY Jettie Booze, MD Taking Active   lisinopril (ZESTRIL) 5 MG tablet [id number] Yes TAKE 1 TABLET (5 MG TOTAL) BY MOUTH DAILY. PLEASE KEEP UPCOMING APPT FOR FUTURE REFILLS. Jettie Booze, MD Taking Active   nitroGLYCERIN (NITROSTAT) 0.4 MG SL tablet [id number] Yes Place 1 tablet (0.4 mg total) under the tongue every 5 (five) minutes as needed for chest pain. Jettie Booze, MD Taking Active   OVER THE COUNTER MEDICATION [id number] Yes Place 1 drop into both eyes 2 (two) times daily. liquid msm eye drops/ for  floaters in the eyes [provider] Taking Active Self  Semaglutide, 1 MG/DOSE, (OZEMPIC, 1 MG/DOSE,) 4 MG/3ML SOPN [id number] No Inject 1 mg into the skin once a week. Via [employer] PAP  Patient not taking: Reported on 10/14/2021   [provider] Not Taking Active            Med Note Charlton Haws   Fri Oct 14, 2021 12:45 PM) Still has 0.5 mg dose, waiting on 1 mg dose delivery from [facility] ~mid October  tamsulosin (FLOMAX) 0.4 MG CAPS capsule [id number] Yes TAKE 1 CAPSULE (0.4 MG TOTAL) BY MOUTH DAILY. FOR URINE FLOW. [name], NP Taking Active   tiZANidine (ZANAFLEX) 4 MG tablet [id number] Yes Take 0.5-1 tablets (2-4 mg total) by mouth at bedtime as needed for muscle spasms. Owens Loffler, MD Taking Active             Patient Active Problem List   Diagnosis Date Noted   Syncope 04/11/2019   ICD (implantable cardioverter-defibrillator) in place 04/11/2019   Unstable angina ([place]) 01/05/2019   Diarrhea 12/26/2018   Allergic rhinitis 06/21/2018   NSTEMI (non-ST elevated myocardial infarction) ([place]) 08/02/2015   Insomnia 07/21/2014   Hx of CABG 07/19/2014   Nonsustained ventricular tachycardia ([place]) 06/23/2014   Cardiomyopathy, ischemic    CAD (coronary artery disease) of artery bypass graft    Junctional bradycardia    Coronary artery disease of native heart with stable angina pectoris ([place]) 05/12/2014   Hyperlipidemia 04/21/2013   NASH (nonalcoholic steatohepatitis) 09/06/2011   Rotator cuff tendinitis 07/28/2011   Hypertension    BPH (benign prostatic hyperplasia) 03/11/2011   IBS (irritable bowel syndrome) 03/11/2011   Bursitis of hip 03/11/2011   DM type 2 (diabetes mellitus, type 2) ([place]) 01/21/2009   ATRIAL FIBRILLATION, PAROXYSMAL 01/21/2009    Immunization History  Administered Date(s) Administered   Fluad Quad(high Dose 65+) 10/28/2019, 09/19/2021   Influenza Split 11/02/2013   Influenza,inj,Quad PF,6+ Mos 11/08/2015, 10/24/2016, 09/07/2017, 09/17/2018   Influenza-Unspecified 11/02/2012   PFIZER(Purple Top)SARS-COV-2 Vaccination 02/28/2019, 03/26/2019, 10/28/2019   Pneumococcal Conjugate-13 12/11/2014   Pneumococcal Polysaccharide-23 05/25/2005, 08/24/2016   Tdap  01/16/2011   Zoster Recombinat (Shingrix) 09/21/2018, 12/18/2018   Zoster, Live 03/02/2013    Conditions to be addressed/monitored:  Hypertension, Hyperlipidemia, Diabetes, and Atrial Fibrillation  Care Plan : [place]  Updates made by Charlton Haws, [place] since 11/03/2021 12:00 AM     Problem: Hypertension, Hyperlipidemia, Diabetes, and Atrial Fibrillation      Long-Range Goal: Disease mgmt   Start Date: 12/21/2020  Expected End Date: 10/15/2022  This Visit's Progress: On track  Recent Progress: On track  Priority: High  Note:   Current Barriers:  Suboptimal therapeutic regimen for diabetes  Pharmacist Clinical Goal(s):  Patient will adhere to plan to optimize therapeutic regimen for diabetes as evidenced by report of adherence to recommended medication management changes through collaboration with PharmD and provider.   Interventions: 1:1 collaboration with [name], NP regarding development and update of comprehensive plan of care as evidenced by provider attestation and co-signature Inter-disciplinary care team collaboration (see longitudinal plan of care) Comprehensive medication review performed; medication list updated in electronic medical record  Hypertension / Heart Failure (BP goal <130/80) -Controlled - per clinic readings -Last ejection fraction: 35-40% (Date: 01/2019) -HF type: Systolic; NYHA Class: I (no actitivty limitation) -Current treatment: Carvedilol 6.25 mg BID - Appropriate, Effective, Safe, Accessible Isosorbide Mononitrate 30 mg daily - Appropriate, Effective,  Safe, Accessible Lisinopril 5 mg daily -Appropriate, Effective, Safe, Accessible Farxiga 10 mg daily -Appropriate, Effective, Safe, Accessible -Medications previously tried: none  -Current home readings: none reported -Denies hypotensive/hypertensive symptoms; Denies chest pain, SOB, dizziness, falls. -Educated on importance of daily adherence; reviewed refill  history -Counseled to monitor BP at home if symptomatic, document, and provide log at future appointments -Recommended to continue current medication  Hyperlipidemia: (LDL goal < 70) -Not ideally controlled - LDL 125 (05/2021) off of cholesterol meds -Hx CAD (CABG 2016); follows with cardiology (Dr Irish Lack); he has discussed PSCK9-inhibitors with cardiology pharmacist and deferred due to multiple injections -No aspirin due to Eliquis -Current treatment: Nitroglycerin 0.4 mg SL PRN -Medications previously tried: pravastatin 20 mg, rosuvastatin 10 mg TIW, simvastatin 10 mg TIW, atorvastatin, ezetimibe -Educated on Cholesterol goals;  -Consider Leqvio  Diabetes (A1c goal <7%) -Uncontrolled - A1c 8.1% (09/2021), pt has been taking 2 injections of Ozempic 0.5 mg weekly to make 1 mg dose, with that he has some low glucose overnight; he is down to 40 units AM and 35 units PM of Novolin N; he has not received new insulin yet from Novo (changing to [place] and Novolog) -Current home glucose readings: Reviewed AGP report: 10/21/21 to 11/03/21. Sensor active: 90%  Time in range (70-180): 38% (goal > 70%)  High (180-250): 41%  Very high (>250): 20%  Low (< 70): 1% (goal < 4%)  GMI: 8.0%; Average glucose: 196   -Previous AGP report: 09/30/21 to 10/13/21. Sensor active: 82%  Time in range (70-180): 24% (goal > 70%)  High (>180): 76%  Low (< 70): 0% (goal < 4%)  GMI: 9.0%; Average glucose: 237  -Reports 3 low sugars in the last 3 months -Current medications: Farxiga 10 mg daily - Appropriate, Query Effective Novolin N -  40 u AM, 35 u PM -Appropriate, Query Effective Novolin R - 25 u with B/L, 15 u w/ dinner -Appropriate, Query Effective Ozempic 0.5 mg x 2 weekly -Appropriate, Query Effective Freestyle Libre 2 (app) - Appropriate, Effective, Safe, Accessible -Medications previously tried: glipizide (hypoglycemia) -Per CGM report glucose control has improved with higher dose of Ozempic/resumed  [place]; he is dropping low overnight on occasion - switching to Tresiba/Novolog should help significantly with preventing hypoglycemia but in the meantime Novolin can be adjusted -While awaiting Tresiba/Novolog delivery, advised pt to reduce PM dose of Novolin N to 30 units.  Atrial Fibrillation (Goal: prevent stroke and major bleeding) -Followed by cardiology -CHADSVASC: 6 -Reviewed adherence: Eliquis PDC 66% less than ideal -Current treatment: Carvedilol 6.25 mg BID - Appropriate, Effective, Safe, Accessible Eliquis 5 mg BID -Appropriate, Effective, Safe, Accessible -Medications previously tried: none reported -Home BP and HR readings: none reported -Counseled on increased risk of stroke due to Afib and benefits of anticoagulation for stroke prevention; importance of adherence to anticoagulant exactly as prescribed; avoidance of NSAIDs due to increased bleeding risk with anticoagulants; -Inquired about Eliquis affordability - Pr reports $49/30 day supply; He usually goes into donut hole towards end of the year and rations the Eliquis at that time. He takes it once daily until he can afford it again. -Discussed NSAID use, he takes Advil before golf 2-3 days per week. Tylenol ineffective. Discussed bleeding risk and limiting use to lowest dose and to report any s/s of bleeding. -Recommended to continue current medication  BPH (Goal: Improve symptoms) -Controlled - works well for patient -Pt affirms adherence -Current treatment  Tamsulosin 0.4 mg daily  -Medications previously tried: none reported  -  Recommended to continue current medication  Patient Goals/Self-Care Activities Patient will:  - take medications as prescribed as evidenced by patient report and record review focus on medication adherence by routine check glucose using CGM, document, and provide at future appointments       Medication Assistance:  [place] (approved through 01/01/22) [employer] - [place],  [place] - pending [place] (approved through 01/01/22)  Compliance/Adherence/Medication fill history: Care Gaps: Urine ACR Foot exam  Star-Rating Drugs: Lisinopril - PDC 94%  Medication Access: Within the past 30 days, how often has patient missed a dose of medication? 0 Is a pillbox or other method used to improve adherence? Yes  Factors that may affect medication adherence?  Confusion with multiple pharmacies / PAP programs Are meds synced by current pharmacy? No  Are meds delivered by current pharmacy? No  Does patient experience delays in picking up medications due to transportation concerns? No   Upstream Services Reviewed: Is patient disadvantaged to use [facility]?: No  Current Rx insurance plan: [facility] Name and location of Current pharmacy[facility]  [id number] [place] [address]Phone: [phone]: [phone] [facility] services reviewed with patient today?: No  Patient requests to transfer care to [facility]?: No  Reason patient declined to change pharmacies: Not mentioned at this visit   Care Plan and Follow Up Patient Decision:  Patient agrees to Care Plan and Follow-up.  Plan: Telephone follow up appointment with care management team member scheduled for:  1 month  LCharlene Brooke PharmD, BSouthwestern Eye Center LtdClinical Pharmacist LGarretts MillPrimary Care at SAdventhealth [phone]

## 2021-11-03 NOTE — Telephone Encounter (Signed)
Patient came by to pick up ozempic 1:55 pm

## 2021-11-04 ENCOUNTER — Telehealth: Payer: Self-pay

## 2021-11-04 NOTE — Progress Notes (Cosign Needed)
I spoke with patient; he verified neither him nor his wife came and picked up the 1.0 mg box of medication from the office. Patient did state he would take two doses of the 0.5 mg to equal his usual dose of 1.0 mg.  [name], CPP notified  [name], [place] Pharmacy Assistant [phone]

## 2021-11-04 NOTE — Telephone Encounter (Signed)
-----   Message from [place], [facility] sent at 11/03/2021  4:17 PM EDT ----- Regarding: FW: Ozempic 1 mg shipment Please check with patient - our records show someone picked up Ozempic 1 mg for him from our office on 10/10 (box is blue), he told me he never heard from anyone and never picked this up. Did his wife pick this up for him?  He also picked up the 0.5 mg dose (red box) today. The important thing is that he gets 1 mg dose each week - he can do this by doing 2 doses of 0.5 mg if he needs to.  ----- Message ----- From: Pat Kocher, CMA Sent: 11/03/2021   4:12 PM EDT To: [name], NP; Reginia Naas; # Subject: RE: Ozempic 1 mg shipment                      Spoke with Ria Comment, our log shows patient picked up Ozempic 47m on 10/11/21 given to patient by JJanett Billow Patient picked up Ozempic 0.5 mg this afternoon.   ----- Message ----- From: CPleas Koch NP Sent: 11/03/2021   1:54 PM EDT To: LAlyson Locket [place] # Subject: RE: Ozempic 1 mg shipment                      KVida Roller Have you seen his Ozempic 1 mg? ----- Message ----- From: FCharlton Haws RSt James HealthcareSent: 11/03/2021  10:28 AM EDT To: KPleas Koch NP; LChester HolsteinGorczynski Subject: Ozempic 1 mg shipment                          Hi LMickel Baas(and KAnda Kraft- this mostly FYI for you),   I [place] [facility] [place] this [place]'s [place] stating we received Ozempic 1 mg (scan date 10/18). I spoke with patient and he said he was not contacted and he did not pick anything up yet - I looked in the fridge and I did not see this product, there is a previous shipment of the Ozempic 0.5 mg dose but no 1 mg dose. The invoice clearly states it is the 1 mg (4 mg/324m dose, so I'm just trying to track down what might have happened to this.  Thanks, LiMendel Ryder

## 2021-11-08 NOTE — Patient Instructions (Signed)
Visit Information  Phone number for Pharmacist: [phone]   Goals Addressed   None     Patient Care Plan: CCM Pharmacy Care Plan     Problem Identified: Hypertension, Hyperlipidemia, Diabetes, and Atrial Fibrillation      Long-Range Goal: Disease mgmt   Start Date: 12/21/2020  Expected End Date: 10/15/2022  This Visit's Progress: On track  Recent Progress: On track  Priority: High  Note:   Current Barriers:  Suboptimal therapeutic regimen for diabetes  Pharmacist Clinical Goal(s):  Patient will adhere to plan to optimize therapeutic regimen for diabetes as evidenced by report of adherence to recommended medication management changes through collaboration with PharmD and provider.   Interventions: 1:1 collaboration with [name], NP regarding development and update of comprehensive plan of care as evidenced by provider attestation and co-signature Inter-disciplinary care team collaboration (see longitudinal plan of care) Comprehensive medication review performed; medication list updated in electronic medical record  Hypertension / Heart Failure (BP goal <130/80) -Controlled - per clinic readings -Last ejection fraction: 35-40% (Date: 01/2019) -HF type: Systolic; NYHA Class: I (no actitivty limitation) -Current treatment: Carvedilol 6.25 mg BID - Appropriate, Effective, Safe, Accessible Isosorbide Mononitrate 30 mg daily - Appropriate, Effective, Safe, Accessible Lisinopril 5 mg daily -Appropriate, Effective, Safe, Accessible Farxiga 10 mg daily -Appropriate, Effective, Safe, Accessible -Medications previously tried: none  -Current home readings: none reported -Denies hypotensive/hypertensive symptoms; Denies chest pain, SOB, dizziness, falls. -Educated on importance of daily adherence; reviewed refill history -Counseled to monitor BP at home if symptomatic, document, and provide log at future appointments -Recommended to continue current  medication  Hyperlipidemia: (LDL goal < 70) -Not ideally controlled - LDL 125 (05/2021) off of cholesterol meds -Hx CAD (CABG 2016); follows with cardiology (Dr Irish Lack); he has discussed PSCK9-inhibitors with cardiology pharmacist and deferred due to multiple injections -No aspirin due to Eliquis -Current treatment: Nitroglycerin 0.4 mg SL PRN -Medications previously tried: pravastatin 20 mg, rosuvastatin 10 mg TIW, simvastatin 10 mg TIW, atorvastatin, ezetimibe -Educated on Cholesterol goals;  -Consider Leqvio  Diabetes (A1c goal <7%) -Uncontrolled - A1c 8.1% (09/2021), pt has been taking 2 injections of Ozempic 0.5 mg weekly to make 1 mg dose, with that he has some low glucose overnight; he is down to 40 units AM and 35 units PM of Novolin N; he has not received new insulin yet from [facility] (changing to [place] and Novolog) -Current home glucose readings: Reviewed AGP report: 10/21/21 to 11/03/21. Sensor active: 90%  Time in range (70-180): 38% (goal > 70%)  High (180-250): 41%  Very high (>250): 20%  Low (< 70): 1% (goal < 4%)  GMI: 8.0%; Average glucose: 196   -Previous AGP report: 09/30/21 to 10/13/21. Sensor active: 82%  Time in range (70-180): 24% (goal > 70%)  High (>180): 76%  Low (< 70): 0% (goal < 4%)  GMI: 9.0%; Average glucose: 237  -Reports 3 low sugars in the last 3 months -Current medications: Farxiga 10 mg daily - Appropriate, Query Effective Novolin N -  40 u AM, 35 u PM -Appropriate, Query Effective Novolin R - 25 u with B/L, 15 u w/ dinner -Appropriate, Query Effective Ozempic 0.5 mg x 2 weekly -Appropriate, Query Effective Freestyle Libre 2 (app) - Appropriate, Effective, Safe, Accessible -Medications previously tried: glipizide (hypoglycemia) -Per CGM report glucose control has improved with higher dose of Ozempic/resumed Farxiga; he is dropping low overnight on occasion - switching to Tresiba/Novolog should help significantly with preventing hypoglycemia but in  the meantime  Novolin can be adjusted -While awaiting Tresiba/Novolog delivery, advised pt to reduce PM dose of Novolin N to 30 units.  Atrial Fibrillation (Goal: prevent stroke and major bleeding) -Followed by cardiology -CHADSVASC: 6 -Reviewed adherence: Eliquis PDC 66% less than ideal -Current treatment: Carvedilol 6.25 mg BID - Appropriate, Effective, Safe, Accessible Eliquis 5 mg BID -Appropriate, Effective, Safe, Accessible -Medications previously tried: none reported -Home BP and HR readings: none reported -Counseled on increased risk of stroke due to Afib and benefits of anticoagulation for stroke prevention; importance of adherence to anticoagulant exactly as prescribed; avoidance of NSAIDs due to increased bleeding risk with anticoagulants; -Inquired about Eliquis affordability - Pr reports $49/30 day supply; He usually goes into donut hole towards end of the year and rations the Eliquis at that time. He takes it once daily until he can afford it again. -Discussed NSAID use, he takes Advil before golf 2-3 days per week. Tylenol ineffective. Discussed bleeding risk and limiting use to lowest dose and to report any s/s of bleeding. -Recommended to continue current medication  BPH (Goal: Improve symptoms) -Controlled - works well for patient -Pt affirms adherence -Current treatment  Tamsulosin 0.4 mg daily  -Medications previously tried: none reported  -Recommended to continue current medication  Patient Goals/Self-Care Activities Patient will:  - take medications as prescribed as evidenced by patient report and record review focus on medication adherence by routine check glucose using CGM, document, and provide at future appointments      Patient verbalizes understanding of instructions and care plan provided today and agrees to view in [place]. Active MyChart status and patient understanding of how to access instructions and care plan via MyChart confirmed with patient.     Telephone follow up appointment with pharmacy team member scheduled for: 1 month  [name], PharmD, [facility] Clinical Pharmacist [facility] at [facility] [phone]

## 2021-11-09 NOTE — Telephone Encounter (Signed)
[name], how do we get the Ozempic 1 mg dose from [facility]?

## 2021-11-10 NOTE — Telephone Encounter (Signed)
Noted  

## 2021-11-10 NOTE — Telephone Encounter (Signed)
My assistant has been trying to get in touch with them - they have wait times over an hour and apparently have not been calling back when we leave a call-back number. Will keep working on it!

## 2021-11-13 NOTE — Progress Notes (Unsigned)
    [name] [name], MD, [place] at [facility] [place], [address]  Phone: [phone]  FAX: [place] - 76 y.o. male  MRN [id number]  Date of Birth: 1945-03-16  Date: 11/14/2021  PCP: [name] Koch, NP  Referral: [name] Koch, NP  No chief complaint on file.  Subjective:   [name] is a 76 y.o. very pleasant male patient with There is no height or weight on file to calculate BMI. who presents with the following:  He is a patient who I have historically seen for right-sided lumbar radiculopathy, roughly a little bit more than 1 year ago.  He presents today with some acute back pain as well as radicular symptoms down his legs.  I also intervally saw him for frozen shoulder several months ago on the right side.    Review of Systems is noted in the HPI, as appropriate  Objective:   There were no vitals taken for this visit.  GEN: No acute distress; alert,appropriate. PULM: Breathing comfortably in no respiratory distress PSYCH: Normally interactive.   Laboratory and Imaging Data:  Assessment and Plan:   ***

## 2021-11-14 ENCOUNTER — Ambulatory Visit (INDEPENDENT_AMBULATORY_CARE_PROVIDER_SITE_OTHER): Payer: Medicare Other | Admitting: Family Medicine

## 2021-11-14 ENCOUNTER — Ambulatory Visit (INDEPENDENT_AMBULATORY_CARE_PROVIDER_SITE_OTHER)
Admission: RE | Admit: 2021-11-14 | Discharge: 2021-11-14 | Disposition: A | Discharge status: Home / Self Care | Payer: Medicare Other | Source: Ambulatory Visit | Attending: Family Medicine | Admitting: Family Medicine

## 2021-11-14 ENCOUNTER — Encounter: Payer: Self-pay | Admitting: Family Medicine

## 2021-11-14 VITALS — BP 100/60 | HR 93 | Temp 98.7°F | Ht 68.0 in | Wt 205.4 lb

## 2021-11-14 DIAGNOSIS — M5137 Other intervertebral disc degeneration, lumbosacral region: Secondary | ICD-10-CM

## 2021-11-14 DIAGNOSIS — R051 Acute cough: Secondary | ICD-10-CM | POA: Diagnosis not present

## 2021-11-14 DIAGNOSIS — J441 Chronic obstructive pulmonary disease with (acute) exacerbation: Secondary | ICD-10-CM

## 2021-11-14 DIAGNOSIS — M5442 Lumbago with sciatica, left side: Secondary | ICD-10-CM

## 2021-11-14 DIAGNOSIS — M5441 Lumbago with sciatica, right side: Secondary | ICD-10-CM

## 2021-11-14 DIAGNOSIS — M545 Low back pain, unspecified: Secondary | ICD-10-CM | POA: Diagnosis not present

## 2021-11-14 DIAGNOSIS — G8929 Other chronic pain: Secondary | ICD-10-CM

## 2021-11-14 DIAGNOSIS — M5416 Radiculopathy, lumbar region: Secondary | ICD-10-CM

## 2021-11-14 LAB — POC COVID19 BINAXNOW: SARS Coronavirus 2 Ag: NEGATIVE

## 2021-11-14 MED ORDER — AZITHROMYCIN 250 MG PO TABS: ORAL_TABLET | ORAL | 0 refills | Status: AC

## 2021-11-14 MED ORDER — PREDNISONE 20 MG PO TABS: 20.0000 mg | ORAL_TABLET | Freq: Every day | ORAL | 0 refills | Status: DC

## 2021-11-14 MED ORDER — ALBUTEROL SULFATE HFA 108 (90 BASE) MCG/ACT IN AERS: 2.0000 | INHALATION_SPRAY | Freq: Four times a day (QID) | RESPIRATORY_TRACT | 3 refills | Status: AC | PRN

## 2021-11-22 ENCOUNTER — Telehealth: Payer: Self-pay | Admitting: Primary Care

## 2021-11-22 NOTE — Telephone Encounter (Signed)
Patient came by and picked up medication. Thank you!

## 2021-11-23 DIAGNOSIS — R051 Acute cough: Secondary | ICD-10-CM | POA: Diagnosis not present

## 2021-11-23 DIAGNOSIS — I25119 Atherosclerotic heart disease of native coronary artery with unspecified angina pectoris: Secondary | ICD-10-CM | POA: Diagnosis not present

## 2021-11-23 DIAGNOSIS — R42 Dizziness and giddiness: Secondary | ICD-10-CM | POA: Diagnosis not present

## 2021-11-23 DIAGNOSIS — R0981 Nasal congestion: Secondary | ICD-10-CM | POA: Diagnosis not present

## 2021-11-29 ENCOUNTER — Telehealth: Payer: Self-pay

## 2021-11-29 NOTE — Progress Notes (Signed)
Chronic Care Management Pharmacy Assistant   Name: [name]  MRN: [id number] DOB: June 24, 1945  Reason for Encounter: CCM (Appointment Reminder)  Medications: Outpatient Encounter Medications as of 11/29/2021  Medication Sig Note   acetaminophen (TYLENOL) 325 MG tablet Take 1-2 tablets (325-650 mg total) by mouth every 4 (four) hours as needed for mild pain.    albuterol (VENTOLIN HFA) 108 (90 Base) MCG/ACT inhaler Inhale 2 puffs into the lungs every 6 (six) hours as needed for wheezing or shortness of breath.    carvedilol (COREG) 6.25 MG tablet TAKE 1 TABLET BY MOUTH TWICE A DAY WITH MEAL    Continuous Blood Gluc Receiver (FREESTYLE LIBRE 2 READER) DEVI Use to check blood sugars.    Continuous Blood Gluc Sensor (FREESTYLE LIBRE 2 SENSOR) MISC Apply every 14 days to check blood sugars.    dapagliflozin propanediol (FARXIGA) 10 MG TABS tablet Take 1 tablet (10 mg total) by mouth daily before breakfast. For diabetes. 10/12/2021: [employer] PAP   ELIQUIS 5 MG TABS tablet TAKE 1 TABLET BY MOUTH TWICE A DAY    insulin aspart (NOVOLOG FLEXPEN) 100 UNIT/ML FlexPen Inject 25 units with breakfast and lunch, and 15 units with dinner. Via [employer] PAP 11/08/2021: Pending PAP shipment   insulin degludec (TRESIBA FLEXTOUCH) 200 UNIT/ML FlexTouch Pen Inject 80 Units into the skin daily. Via [employer] PAP 11/08/2021: Pending PAP shipment   insulin NPH Human (NOVOLIN N) 100 UNIT/ML injection 40 units AM, 30 units PM (using until Tresiba/Novolog shipment arrives)    Insulin Pen Needle 31G X 8 MM MISC Use as directed with Lantus.    isosorbide mononitrate (IMDUR) 30 MG 24 hr tablet TAKE 1 TABLET BY MOUTH EVERY DAY    lisinopril (ZESTRIL) 5 MG tablet TAKE 1 TABLET (5 MG TOTAL) BY MOUTH DAILY. PLEASE KEEP UPCOMING APPT FOR FUTURE REFILLS.    nitroGLYCERIN (NITROSTAT) 0.4 MG SL tablet Place 1 tablet (0.4 mg total) under the tongue every 5 (five) minutes as needed for chest pain.    OVER THE COUNTER MEDICATION  Place 1 drop into both eyes 2 (two) times daily. liquid msm eye drops/ for floaters in the eyes    predniSONE (DELTASONE) 20 MG tablet Take 1 tablet (20 mg total) by mouth daily with breakfast.    Semaglutide, 1 MG/DOSE, (OZEMPIC, 1 MG/DOSE,) 4 MG/3ML SOPN Inject 1 mg into the skin once a week. Via [employer] PAP 11/08/2021: Currently taking 0.5 mg x 2 injections   tamsulosin (FLOMAX) 0.4 MG CAPS capsule TAKE 1 CAPSULE (0.4 MG TOTAL) BY MOUTH DAILY. FOR URINE FLOW.    tiZANidine (ZANAFLEX) 4 MG tablet Take 0.5-1 tablets (2-4 mg total) by mouth at bedtime as needed for muscle spasms.    No facility-administered encounter medications on file as of 11/29/2021.   [name] was contacted to remind of upcoming telephone visit with [name] on 12/02/2021 at 11:00. Patient was reminded to have any blood glucose and blood pressure readings available for review at appointment.   Message was left reminding patient of appointment.  CCM referral has been placed prior to visit?  No    Star Rating Drugs: Medication:                Last Fill:         Day Supply Lisinopril 5 mg             10/10/21          90 Ozempic 1  mg             PAP   [name], CPP notified   [name], [place] Clinical Pharmacy Assistant [phone]      b

## 2021-11-30 ENCOUNTER — Encounter: Payer: Self-pay | Admitting: Primary Care

## 2021-11-30 ENCOUNTER — Ambulatory Visit (INDEPENDENT_AMBULATORY_CARE_PROVIDER_SITE_OTHER): Payer: Medicare Other | Admitting: Primary Care

## 2021-11-30 VITALS — BP 116/72 | HR 88 | Temp 98.4°F | Ht 68.0 in | Wt 192.0 lb

## 2021-11-30 DIAGNOSIS — R1013 Epigastric pain: Secondary | ICD-10-CM | POA: Insufficient documentation

## 2021-11-30 DIAGNOSIS — R112 Nausea with vomiting, unspecified: Secondary | ICD-10-CM | POA: Diagnosis not present

## 2021-11-30 MED ORDER — ONDANSETRON 4 MG PO TBDP: 4.0000 mg | ORAL_TABLET | Freq: Three times a day (TID) | ORAL | 0 refills | Status: AC | PRN

## 2021-11-30 NOTE — Assessment & Plan Note (Signed)
Likely secondary to vomiting, also could be a combination of GERD/dose increase of Ozempic.  Fortunately, he is feeling better.  Checking lipase, CBC with differential, BMP today. Recommended he start liquid antacid treatment for his sore throat and esophageal burning.  Prescription for Zofran 4 mg provided to use as needed.

## 2021-11-30 NOTE — Assessment & Plan Note (Signed)
Unclear etiology.  This could be a combination of a viral GI infection, increase dose of Ozempic to 1 mg, antibiotic side effects.  Checking labs today including lipase, CBC with differential, BMP. Strongly encouraged to increase liquid intake.  Reduce Novolin N by 5 to 10 units twice daily while not eating, he will watch his glucose levels closely. Prescription for Zofran 4 mg sent to pharmacy to use as needed. Start OTC liquid antacid for what sounds to be reflux symptoms to his throat and chest from vomiting.  Return precautions provided.

## 2021-11-30 NOTE — Progress Notes (Signed)
Subjective:    Patient ID: [name], male    DOB: September 19, 1945, 76 y.o.   MRN: [id number]  Emesis  Associated symptoms include abdominal pain and coughing. Pertinent negatives include no chest pain or fever.    [name] is a very pleasant 76 y.o. male with a significant medical history including type 2 diabetes, CAD with NSTEMI, atrial fibrillation, hyperlipidemia, diarrhea, syncope, BPH who presents today to discuss nausea and vomiting.   Evaluated by Dr. Lorelei Pont on 11/14/21 for a 6 day history of cough and SOB. He was treated with prednisone, Azithromycin. He completed this course and didn't feel much better so he presented to Urgent Care.  He presented to Urgent Care on 11/23/21 in [facility], completed a chest xray and was treated with Augmentin, Zpack, prednisone for pneumonia. He began to feel better.    Three days ago he developed nausea, vomiting, abdominal cramping, and a cough.  He has experienced multiple episodes of nausea with vomiting and generalized abdominal cramping over the last several days.  He is experienced a few episodes of diarrhea.  His oral intake of food and liquids has been minimal.  His wife is encouraging him to take sips of Pedialyte and ice chips.  His glucose readings have been in the 50s to 90s.  Today he is feeling better. His last episode of vomiting was this morning.  His major complaint is significant discomfort to his anterior throat, burning-like sensation.  He denies fevers and his cough has improved.  His wife has had cough/cold symptoms, but she never had GI symptoms of nausea or vomiting.  She is feeling better.  He increased his dose of Ozempic to 1 mg weekly on 11/03/2021.   Review of Systems  Constitutional:  Positive for fatigue. Negative for fever.  HENT:  Positive for sore throat. Negative for postnasal drip.   Respiratory:  Positive for cough. Negative for shortness of breath.   Cardiovascular:  Negative for chest pain.   Gastrointestinal:  Positive for abdominal pain, nausea and vomiting.         Past Medical History:  Diagnosis Date   Arthritis    "fingers, shoulders" (08/03/2015)   Atrial fibrillation (HCC)    Paroxysmal, rare episodes, sinus rhythm on flecainide, patient prefers not to take Coumadin   Bradycardia    April, 2013   Chest pain    Nuclear, 2006, no ischemia   Chronic lower back pain    "all my life"   Coronary artery disease    s/p staged cath 05/12/2014 and 5/11, DES x 2 to heavily calcified RCA, residual with 60% prox LAD, 70% D1   Ejection fraction    EF 55-60%,  echo, January, 2011   Elevated CPK    CPK elevated with normal MB and normal troponin the past   Heart murmur    History of echocardiogram    Echo 8/16: EF 55%, normal wall motion, grade 2 diastolic dysfunction, trivial MR, normal RV function, PASP 27 mmHg   Hypertension    IBS (irritable bowel syndrome)    Myocardial infarction ([place]) 06/2014   Sinus drainage    Type II diabetes mellitus ([place])     Social History   Socioeconomic History   Marital status: Married    Spouse name: Not on file   Number of children: Not on file   Years of education: Not on file   Highest education level: Not on file  Occupational History   Not on  file  Tobacco Use   Smoking status: Never   Smokeless tobacco: Never  Vaping Use   Vaping Use: Never used  Substance and Sexual Activity   Alcohol use: No   Drug use: No   Sexual activity: Not on file  Other Topics Concern   Not on file  Social History Narrative   Not on file   Social Determinants of Health   Financial Resource Strain: Low Risk  (01/07/2021)   Overall Financial Resource Strain (CARDIA)    Difficulty of Paying Living Expenses: Not hard at all  Recent Concern: Financial Resource Strain - High Risk (12/21/2020)   Overall Financial Resource Strain (CARDIA)    Difficulty of Paying Living Expenses: Hard  Food Insecurity: No Food Insecurity (01/07/2021)   Hunger  Vital Sign    Worried About Running Out of Food in the Last Year: Never true    Ran Out of Food in the Last Year: Never true  Transportation Needs: No Transportation Needs (01/07/2021)   PRAPARE - Hydrologist (Medical): No    Lack of Transportation (Non-Medical): No  Physical Activity: Sufficiently Active (01/07/2021)   Exercise Vital Sign    Days of Exercise per Week: 3 days    Minutes of Exercise per Session: 150+ min  Stress: No Stress Concern Present (01/07/2021)   [place]    Feeling of Stress : Not at all  Social Connections: [place] (01/07/2021)   Social Connection and Isolation Panel [NHANES]    Frequency of Communication with Friends and Family: More than three times a week    Frequency of Social Gatherings with Friends and Family: More than three times a week    Attends Religious Services: More than 4 times per year    Active Member of Clubs or Organizations: Yes    Attends Archivist Meetings: More than 4 times per year    Marital Status: Married  Human resources officer Violence: Not At Risk (01/07/2021)   Humiliation, Afraid, Rape, and Kick questionnaire    Fear of Current or Ex-Partner: No    Emotionally Abused: No    Physically Abused: No    Sexually Abused: No    Past Surgical History:  Procedure Laterality Date   APPENDECTOMY     CARDIAC CATHETERIZATION N/A 05/12/2014   Procedure: Left Heart Cath and Coronary Angiography;  Surgeon: Troy Sine, MD;  Location: [place] [facility];  Service: Cardiovascular;  Laterality: N/A;   CARDIAC CATHETERIZATION N/A 05/13/2014   Procedure: Coronary/Graft Atherectomy;  Surgeon: Troy Sine, MD;  Location: [place] [facility];  Service: Cardiovascular;  Laterality: N/A;   CARDIAC CATHETERIZATION Right 05/13/2014   Procedure: Temporary Pacemaker;  Surgeon: Troy Sine, MD;  Location: [place] [facility];  Service:  Cardiovascular;  Laterality: Right;   CARDIAC CATHETERIZATION N/A 05/13/2014   Procedure: Coronary Stent Intervention;  Surgeon: Troy Sine, MD;  Location: [place] [facility];  Service: Cardiovascular;  Laterality: N/A;   CARDIAC CATHETERIZATION N/A 06/18/2014   Procedure: Left Heart Cath and Coronary Angiography;  Surgeon: Peter M Martinique, MD;  Location: [place] [facility];  Service: Cardiovascular;  Laterality: N/A;   CARDIAC CATHETERIZATION N/A 08/03/2015   Procedure: Left Heart Cath and Cors/Grafts Angiography;  Surgeon: Nelva Bush, MD;  Location: [place] [facility] [facility];  Service: Cardiovascular;  Laterality: N/A;   CARDIOVASCULAR STRESS TEST  05/06/2014   CATARACT EXTRACTION W/ INTRAOCULAR LENS IMPLANT  Left    COLONOSCOPY WITH PROPOFOL N/A 02/13/2020   Procedure: COLONOSCOPY WITH PROPOFOL;  Surgeon: Carol Ada, MD;  Location: [facility];  Service: Endoscopy;  Laterality: N/A;   CORONARY ARTERY BYPASS GRAFT N/A 06/22/2014   Procedure: CORONARY ARTERY BYPASS GRAFTING (CABG) x five, using left internal mammary artery, and right leg greater saphenous vein harvested endoscopically;  Surgeon: Melrose Nakayama, MD;  Location: [place];  Service: Open Heart Surgery;  Laterality: N/A;   ICD IMPLANT N/A 01/07/2019   Procedure: ICD IMPLANT;  Surgeon: Thompson Grayer, MD;  Location: [place] [facility];  Service: Cardiovascular;  Laterality: N/A;   LAPAROSCOPIC CHOLECYSTECTOMY     LEFT HEART CATH AND CORS/GRAFTS ANGIOGRAPHY N/A 01/06/2019   Procedure: LEFT HEART CATH AND CORS/GRAFTS ANGIOGRAPHY;  Surgeon: Nelva Bush, MD;  Location: [place] [facility];  Service: Cardiovascular;  Laterality: N/A;   MAZE N/A 06/22/2014   Procedure: MAZE;  Surgeon: Melrose Nakayama, MD;  Location: [place];  Service: Open Heart Surgery;  Laterality: N/A;   POLYPECTOMY  02/13/2020   Procedure: POLYPECTOMY;  Surgeon: Carol Ada, MD;  Location: [facility];  Service: Endoscopy;;   TEE WITHOUT CARDIOVERSION N/A 06/22/2014    Procedure: TRANSESOPHAGEAL ECHOCARDIOGRAM (TEE);  Surgeon: Melrose Nakayama, MD;  Location: [place];  Service: Open Heart Surgery;  Laterality: N/A;    Family History  Problem Relation Age of Onset   Alzheimer's disease Mother    Emphysema Father    Cancer Brother    Arrhythmia Sister    Heart attack Sister    Heart disease Sister    Hyperlipidemia Sister    Hypertension Sister     Allergies  Allergen Reactions   Lipitor [Atorvastatin] Other (See Comments)    Myopathy Spring 2006   Pravastatin Other (See Comments)    LEG CRAMPS   Simvastatin Other (See Comments)    LEG CRAMPS    Current Outpatient Medications on File Prior to Visit  Medication Sig Dispense Refill   albuterol (VENTOLIN HFA) 108 (90 Base) MCG/ACT inhaler Inhale 2 puffs into the lungs every 6 (six) hours as needed for wheezing or shortness of breath. 8 g 3   carvedilol (COREG) 6.25 MG tablet TAKE 1 TABLET BY MOUTH TWICE A DAY WITH MEAL 180 tablet 2   Continuous Blood Gluc Receiver (FREESTYLE LIBRE 2 READER) DEVI Use to check blood sugars. 1 each 0   Continuous Blood Gluc Sensor (FREESTYLE LIBRE 2 SENSOR) MISC Apply every 14 days to check blood sugars. 6 each 1   dapagliflozin propanediol (FARXIGA) 10 MG TABS tablet Take 1 tablet (10 mg total) by mouth daily before breakfast. For diabetes. 90 tablet 1   ELIQUIS 5 MG TABS tablet TAKE 1 TABLET BY MOUTH TWICE A DAY 60 tablet 5   insulin aspart (NOVOLOG FLEXPEN) 100 UNIT/ML FlexPen Inject 25 units with breakfast and lunch, and 15 units with dinner. Via Novo Cares PAP 60 mL 0   insulin degludec (TRESIBA FLEXTOUCH) 200 UNIT/ML FlexTouch Pen Inject 80 Units into the skin daily. Via Novo Cares PAP 36 mL 0   insulin NPH Human (NOVOLIN N) 100 UNIT/ML injection 40 units AM, 30 units PM (using until Tresiba/Novolog shipment arrives)     Insulin Pen Needle 31G X 8 MM MISC Use as directed with Lantus. 100 each 5   isosorbide mononitrate (IMDUR) 30 MG 24 hr tablet TAKE 1  TABLET BY MOUTH EVERY DAY 90 tablet 2   lisinopril (ZESTRIL) 5 MG tablet TAKE 1 TABLET (  5 MG TOTAL) BY MOUTH DAILY. PLEASE KEEP UPCOMING APPT FOR FUTURE REFILLS. 90 tablet 1   nitroGLYCERIN (NITROSTAT) 0.4 MG SL tablet Place 1 tablet (0.4 mg total) under the tongue every 5 (five) minutes as needed for chest pain. 25 tablet 6   OVER THE COUNTER MEDICATION Place 1 drop into both eyes 2 (two) times daily. liquid msm eye drops/ for floaters in the eyes     Semaglutide, 1 MG/DOSE, (OZEMPIC, 1 MG/DOSE,) 4 MG/3ML SOPN Inject 1 mg into the skin once a week. Via Novo Cares PAP     tamsulosin (FLOMAX) 0.4 MG CAPS capsule TAKE 1 CAPSULE (0.4 MG TOTAL) BY MOUTH DAILY. FOR URINE FLOW. 90 capsule 1   tiZANidine (ZANAFLEX) 4 MG tablet Take 0.5-1 tablets (2-4 mg total) by mouth at bedtime as needed for muscle spasms. 30 tablet 2   acetaminophen (TYLENOL) 325 MG tablet Take 1-2 tablets (325-650 mg total) by mouth every 4 (four) hours as needed for mild pain. (Patient not taking: Reported on 11/30/2021)     predniSONE (DELTASONE) 20 MG tablet Take 1 tablet (20 mg total) by mouth daily with breakfast. (Patient not taking: Reported on 11/30/2021) 7 tablet 0   No current facility-administered medications on file prior to visit.    BP 116/72   Pulse 88   Temp 98.4 F (36.9 C) (Temporal)   Ht 5' 8"  (1.727 m)   Wt 192 lb (87.1 kg)   SpO2 99%   BMI 29.19 kg/m  Objective:   Physical Exam Constitutional:      Appearance: He is ill-appearing.  Cardiovascular:     Rate and Rhythm: Normal rate and regular rhythm.  Pulmonary:     Effort: Pulmonary effort is normal.     Breath sounds: Normal breath sounds. No wheezing or rales.  Abdominal:     General: Bowel sounds are normal.     Palpations: Abdomen is soft.     Tenderness: There is abdominal tenderness in the right upper quadrant and epigastric area. There is no guarding.  Skin:    General: Skin is warm and dry.           Assessment & Plan:   Problem  List Items Addressed This Visit       Digestive   Nausea and vomiting - Primary    Unclear etiology.  This could be a combination of a viral GI infection, increase dose of Ozempic to 1 mg, antibiotic side effects.  Checking labs today including lipase, CBC with differential, BMP. Strongly encouraged to increase liquid intake.  Reduce Novolin N by 5 to 10 units twice daily while not eating, he will watch his glucose levels closely. Prescription for Zofran 4 mg sent to pharmacy to use as needed. Start OTC liquid antacid for what sounds to be reflux symptoms to his throat and chest from vomiting.  Return precautions provided.      Relevant Medications   ondansetron (ZOFRAN-ODT) 4 MG disintegrating tablet   Other Relevant Orders   CBC with Differential/Platelet   Basic metabolic panel   Lipase     Other   Epigastric abdominal pain    Likely secondary to vomiting, also could be a combination of GERD/dose increase of Ozempic.  Fortunately, he is feeling better.  Checking lipase, CBC with differential, BMP today. Recommended he start liquid antacid treatment for his sore throat and esophageal burning.  Prescription for Zofran 4 mg provided to use as needed.      Relevant Orders  CBC with Differential/Platelet   Basic metabolic panel   Lipase       [name], NP

## 2021-12-01 ENCOUNTER — Other Ambulatory Visit: Payer: Self-pay | Admitting: Primary Care

## 2021-12-01 DIAGNOSIS — R112 Nausea with vomiting, unspecified: Secondary | ICD-10-CM

## 2021-12-01 LAB — BASIC METABOLIC PANEL
BUN: 25 mg/dL — ABNORMAL HIGH (ref 6–23)
CO2: 29 mEq/L (ref 19–32)
Calcium: 9.7 mg/dL (ref 8.4–10.5)
Chloride: 87 mEq/L — ABNORMAL LOW (ref 96–112)
Creatinine, Ser: 1.29 mg/dL (ref 0.40–1.50)
GFR: 53.87 mL/min — ABNORMAL LOW (ref >60.00)
Glucose, Bld: 464 mg/dL — ABNORMAL HIGH (ref 70–99)
Potassium: 4.6 mEq/L (ref 3.5–5.1)
Sodium: 127 mEq/L — ABNORMAL LOW (ref 135–145)

## 2021-12-01 LAB — CBC WITH DIFFERENTIAL/PLATELET
Basophils Absolute: 0.1 10*3/uL (ref 0.0–0.1)
Basophils Relative: 0.6 % (ref 0.0–3.0)
Eosinophils Absolute: 0 10*3/uL (ref 0.0–0.7)
Eosinophils Relative: 0.4 % (ref 0.0–5.0)
HCT: 52.5 % — ABNORMAL HIGH (ref 39.0–52.0)
Hemoglobin: 17.4 g/dL — ABNORMAL HIGH (ref 13.0–17.0)
Lymphocytes Relative: 7.6 % — ABNORMAL LOW (ref 12.0–46.0)
Lymphs Abs: 1 10*3/uL (ref 0.7–4.0)
MCHC: 33.1 g/dL (ref 30.0–36.0)
MCV: 93.7 fl (ref 78.0–100.0)
Monocytes Absolute: 0.9 10*3/uL (ref 0.1–1.0)
Monocytes Relative: 6.6 % (ref 3.0–12.0)
Neutro Abs: 11.5 10*3/uL — ABNORMAL HIGH (ref 1.4–7.7)
Neutrophils Relative %: 84.8 % — ABNORMAL HIGH (ref 43.0–77.0)
Platelets: 259 10*3/uL (ref 150.0–400.0)
RBC: 5.6 Mil/uL (ref 4.22–5.81)
RDW: 14.3 % (ref 11.5–15.5)
WBC: 13.6 10*3/uL — ABNORMAL HIGH (ref 4.0–10.5)

## 2021-12-01 LAB — LIPASE: Lipase: 30 U/L (ref 11.0–59.0)

## 2021-12-01 NOTE — Telephone Encounter (Signed)
Spoke with patient via phone earlier today.  All questions answered.  We also reviewed his lab reports.

## 2021-12-02 ENCOUNTER — Telehealth: Payer: Medicare Other

## 2021-12-13 ENCOUNTER — Telehealth: Payer: Self-pay | Admitting: Pharmacist

## 2021-12-13 DIAGNOSIS — Z794 Long term (current) use of insulin: Secondary | ICD-10-CM

## 2021-12-13 NOTE — Telephone Encounter (Signed)
I sent a 22-monthsupply of his FWilder Glad[facility] in September 2023. Is there a reason they are requesting extra refills if they have one on file?

## 2021-12-13 NOTE — Telephone Encounter (Signed)
Received fax request from [employer] - they are requesting a refill for Farxiga 10 mg in order to renew patient's enrollment in their assistance program for 2024.  Rx needs to be sent to [facility]:  [facility] - [address], [place]. [place] [address] Phone: [phone] Fax: [phone]  Routing to PCP.

## 2021-12-14 DIAGNOSIS — R059 Cough, unspecified: Secondary | ICD-10-CM | POA: Diagnosis not present

## 2021-12-14 DIAGNOSIS — M791 Myalgia, unspecified site: Secondary | ICD-10-CM | POA: Diagnosis not present

## 2021-12-14 DIAGNOSIS — R0981 Nasal congestion: Secondary | ICD-10-CM | POA: Diagnosis not present

## 2021-12-14 MED ORDER — DAPAGLIFLOZIN PROPANEDIOL 10 MG PO TABS: 10.0000 mg | ORAL_TABLET | Freq: Every day | ORAL | 1 refills | Status: DC

## 2021-12-14 NOTE — Telephone Encounter (Signed)
Noted, new Rx sent to pharmacy.

## 2021-12-14 NOTE — Telephone Encounter (Signed)
I'm not sure - I think they just request a new Rx when they renew the enrollment for the new year, since technically the enrollment ends 01/01/22.

## 2021-12-15 ENCOUNTER — Telehealth: Payer: Self-pay | Admitting: Primary Care

## 2021-12-15 NOTE — Telephone Encounter (Signed)
Contacted the patient with the information on refills on Libre. The patient agreed to contact [facility].  [name], CPP notified  [name], [place]  [phone]

## 2021-12-15 NOTE — Telephone Encounter (Signed)
Patient called in and wanted to speak with you in regards to getting his Elenor Legato refilled. Please advise. Thank you!

## 2021-12-15 NOTE — Telephone Encounter (Signed)
Patients gets Libre refills at [facility]. Last Rx was 10/18/21 for 90 ds with 1 refill, so [facility] should have refills on file. Patient will need to contact [facility] for refill.

## 2021-12-18 ENCOUNTER — Other Ambulatory Visit: Payer: Self-pay | Admitting: Interventional Cardiology

## 2021-12-20 ENCOUNTER — Ambulatory Visit: Payer: Medicare Other | Admitting: Primary Care

## 2021-12-22 ENCOUNTER — Encounter: Payer: Self-pay | Admitting: Primary Care

## 2021-12-22 ENCOUNTER — Ambulatory Visit (INDEPENDENT_AMBULATORY_CARE_PROVIDER_SITE_OTHER): Payer: Medicare Other | Admitting: Primary Care

## 2021-12-22 VITALS — BP 122/76 | HR 77 | Temp 97.5°F | Ht 68.0 in | Wt 196.0 lb

## 2021-12-22 DIAGNOSIS — Z794 Long term (current) use of insulin: Secondary | ICD-10-CM | POA: Diagnosis not present

## 2021-12-22 DIAGNOSIS — N182 Chronic kidney disease, stage 2 (mild): Secondary | ICD-10-CM

## 2021-12-22 DIAGNOSIS — E1122 Type 2 diabetes mellitus with diabetic chronic kidney disease: Secondary | ICD-10-CM

## 2021-12-22 LAB — MICROALBUMIN / CREATININE URINE RATIO
Creatinine,U: 55.3 mg/dL
Microalb Creat Ratio: 2.8 mg/g (ref 0.0–30.0)
Microalb, Ur: 1.6 mg/dL (ref 0.0–1.9)

## 2021-12-22 LAB — POCT GLYCOSYLATED HEMOGLOBIN (HGB A1C): Hemoglobin A1C: 8.1 % — AB (ref 4.0–5.6)

## 2021-12-22 MED ORDER — TRESIBA FLEXTOUCH 200 UNIT/ML ~~LOC~~ SOPN: 100.0000 [IU] | PEN_INJECTOR | Freq: Every day | SUBCUTANEOUS | 0 refills | Status: DC

## 2021-12-22 NOTE — Progress Notes (Signed)
Subjective:    Patient ID: [name], male    DOB: 01-20-1945, 77 y.o.   MRN: [id number]  HPI  [name] is a very pleasant 76 y.o. male with a significant medical history including CAD, cardiomyopathy, type 2 diabetes, NASH, unstable angia, BPH, hyperlipidemia who presents today for follow up of diabetes.    Current medications include: Tresbia 80 units daily, Novolog 25 units with breakfast and lunch, 15 units with dinner, Ozempic 1 mg weekly, Farxiga 10 mg daily,   He is actually injecting 50 units of Tresiba  twice daily. He stopped Novolog injections. He has not used Ozempic in 3 weeks as he thought his blood sugar was "good".   He is checking his blood glucose continuously and is getting readings ranging 120's-high 200's.   Time in target is 55% over last 7 days. Time in target is 48% over last 30 days  He will drop into the 50's a few times nightly while sleeping.    Last A1C: 8.1 in September 2023, 81 today. Last Eye Exam: Due Last Foot Exam: Due Pneumonia Vaccination: UTD Urine Microalbumin: Due Statin: None  Dietary changes since last visit: Decreased appetite   Exercise: None  BP Readings from Last 3 Encounters:  12/22/21 122/76  11/30/21 116/72  11/14/21 100/60       Review of Systems  Respiratory:  Negative for shortness of breath.   Cardiovascular:  Negative for chest pain.  Neurological:  Negative for dizziness and numbness.         Past Medical History:  Diagnosis Date   Arthritis    "fingers, shoulders" (08/03/2015)   Atrial fibrillation (HCC)    Paroxysmal, rare episodes, sinus rhythm on flecainide, patient prefers not to take Coumadin   Bradycardia    April, 2013   Chest pain    Nuclear, 2006, no ischemia   Chronic lower back pain    "all my life"   Coronary artery disease    s/p staged cath 05/12/2014 and 5/11, DES x 2 to heavily calcified RCA, residual with 60% prox LAD, 70% D1   Ejection fraction    EF 55-60%,  echo,  January, 2011   Elevated CPK    CPK elevated with normal MB and normal troponin the past   Heart murmur    History of echocardiogram    Echo 8/16: EF 55%, normal wall motion, grade 2 diastolic dysfunction, trivial MR, normal RV function, PASP 27 mmHg   Hypertension    IBS (irritable bowel syndrome)    Myocardial infarction ([place]) 06/2014   Sinus drainage    Type II diabetes mellitus ([place])     Social History   Socioeconomic History   Marital status: Married    Spouse name: Not on file   Number of children: Not on file   Years of education: Not on file   Highest education level: Not on file  Occupational History   Not on file  Tobacco Use   Smoking status: Never   Smokeless tobacco: Never  Vaping Use   Vaping Use: Never used  Substance and Sexual Activity   Alcohol use: No   Drug use: No   Sexual activity: Not on file  Other Topics Concern   Not on file  Social History Narrative   Not on file   Social Determinants of Health   Financial Resource Strain: Low Risk  (01/07/2021)   Overall Financial Resource Strain (CARDIA)    Difficulty of Paying Living Expenses:  Not hard at all  Recent Concern: Financial Resource Strain - High Risk (12/21/2020)   Overall Financial Resource Strain (CARDIA)    Difficulty of Paying Living Expenses: Hard  Food Insecurity: No Food Insecurity (01/07/2021)   Hunger Vital Sign    Worried About Running Out of Food in the Last Year: Never true    Ran Out of Food in the Last Year: Never true  Transportation Needs: No Transportation Needs (01/07/2021)   PRAPARE - Hydrologist (Medical): No    Lack of Transportation (Non-Medical): No  Physical Activity: Sufficiently Active (01/07/2021)   Exercise Vital Sign    Days of Exercise per Week: 3 days    Minutes of Exercise per Session: 150+ min  Stress: No Stress Concern Present (01/07/2021)   [place]    Feeling of  Stress : Not at all  Social Connections: [place] (01/07/2021)   Social Connection and Isolation Panel [NHANES]    Frequency of Communication with Friends and Family: More than three times a week    Frequency of Social Gatherings with Friends and Family: More than three times a week    Attends Religious Services: More than 4 times per year    Active Member of Clubs or Organizations: Yes    Attends Archivist Meetings: More than 4 times per year    Marital Status: Married  Human resources officer Violence: Not At Risk (01/07/2021)   Humiliation, Afraid, Rape, and Kick questionnaire    Fear of Current or Ex-Partner: No    Emotionally Abused: No    Physically Abused: No    Sexually Abused: No    Past Surgical History:  Procedure Laterality Date   APPENDECTOMY     CARDIAC CATHETERIZATION N/A 05/12/2014   Procedure: Left Heart Cath and Coronary Angiography;  Surgeon: Troy Sine, MD;  Location: [facility];  Service: Cardiovascular;  Laterality: N/A;   CARDIAC CATHETERIZATION N/A 05/13/2014   Procedure: Coronary/Graft Atherectomy;  Surgeon: Troy Sine, MD;  Location: [facility];  Service: Cardiovascular;  Laterality: N/A;   CARDIAC CATHETERIZATION Right 05/13/2014   Procedure: Temporary Pacemaker;  Surgeon: Troy Sine, MD;  Location: [facility];  Service: Cardiovascular;  Laterality: Right;   CARDIAC CATHETERIZATION N/A 05/13/2014   Procedure: Coronary Stent Intervention;  Surgeon: Troy Sine, MD;  Location: [facility];  Service: Cardiovascular;  Laterality: N/A;   CARDIAC CATHETERIZATION N/A 06/18/2014   Procedure: Left Heart Cath and Coronary Angiography;  Surgeon: Peter M Martinique, MD;  Location: [facility];  Service: Cardiovascular;  Laterality: N/A;   CARDIAC CATHETERIZATION N/A 08/03/2015   Procedure: Left Heart Cath and Cors/Grafts Angiography;  Surgeon: Nelva Bush, MD;  Location: [facility];  Service:  Cardiovascular;  Laterality: N/A;   CARDIOVASCULAR STRESS TEST  05/06/2014   CATARACT EXTRACTION W/ INTRAOCULAR LENS IMPLANT Left    COLONOSCOPY WITH PROPOFOL N/A 02/13/2020   Procedure: COLONOSCOPY WITH PROPOFOL;  Surgeon: Carol Ada, MD;  Location: [facility];  Service: Endoscopy;  Laterality: N/A;   CORONARY ARTERY BYPASS GRAFT N/A 06/22/2014   Procedure: CORONARY ARTERY BYPASS GRAFTING (CABG) x five, using left internal mammary artery, and right leg greater saphenous vein harvested endoscopically;  Surgeon: Melrose Nakayama, MD;  Location: [facility];  Service: Open Heart Surgery;  Laterality: N/A;   ICD IMPLANT N/A 01/07/2019   Procedure: ICD IMPLANT;  Surgeon: Rayann Heman,  Jeneen Rinks, MD;  Location: [facility];  Service: Cardiovascular;  Laterality: N/A;   LAPAROSCOPIC CHOLECYSTECTOMY     LEFT HEART CATH AND CORS/GRAFTS ANGIOGRAPHY N/A 01/06/2019   Procedure: LEFT HEART CATH AND CORS/GRAFTS ANGIOGRAPHY;  Surgeon: Nelva Bush, MD;  Location: [facility];  Service: Cardiovascular;  Laterality: N/A;   MAZE N/A 06/22/2014   Procedure: MAZE;  Surgeon: Melrose Nakayama, MD;  Location: [facility];  Service: Open Heart Surgery;  Laterality: N/A;   POLYPECTOMY  02/13/2020   Procedure: POLYPECTOMY;  Surgeon: Carol Ada, MD;  Location: [facility];  Service: Endoscopy;;   TEE WITHOUT CARDIOVERSION N/A 06/22/2014   Procedure: TRANSESOPHAGEAL ECHOCARDIOGRAM (TEE);  Surgeon: Melrose Nakayama, MD;  Location: [facility];  Service: Open Heart Surgery;  Laterality: N/A;    Family History  Problem Relation Age of Onset   Alzheimer's disease Mother    Emphysema Father    Cancer Brother    Arrhythmia Sister    Heart attack Sister    Heart disease Sister    Hyperlipidemia Sister    Hypertension Sister     Allergies  Allergen Reactions   Lipitor [Atorvastatin] Other (See Comments)    Myopathy Spring 2006   Pravastatin Other (See Comments)    LEG CRAMPS   Simvastatin Other (See Comments)     LEG CRAMPS    Current Outpatient Medications on File Prior to Visit  Medication Sig Dispense Refill   acetaminophen (TYLENOL) 325 MG tablet Take 1-2 tablets (325-650 mg total) by mouth every 4 (four) hours as needed for mild pain.     albuterol (VENTOLIN HFA) 108 (90 Base) MCG/ACT inhaler Inhale 2 puffs into the lungs every 6 (six) hours as needed for wheezing or shortness of breath. 8 g 3   carvedilol (COREG) 6.25 MG tablet TAKE 1 TABLET BY MOUTH TWICE A DAY WITH MEAL 180 tablet 1   Continuous Blood Gluc Receiver (FREESTYLE LIBRE 2 READER) DEVI Use to check blood sugars. 1 each 0   Continuous Blood Gluc Sensor (FREESTYLE LIBRE 2 SENSOR) MISC Apply every 14 days to check blood sugars. 6 each 1   dapagliflozin propanediol (FARXIGA) 10 MG TABS tablet Take 1 tablet (10 mg total) by mouth daily before breakfast. For diabetes. 90 tablet 1   ELIQUIS 5 MG TABS tablet TAKE 1 TABLET BY MOUTH TWICE A DAY 60 tablet 5   Insulin Pen Needle 31G X 8 MM MISC Use as directed with Lantus. 100 each 5   isosorbide mononitrate (IMDUR) 30 MG 24 hr tablet TAKE 1 TABLET BY MOUTH EVERY DAY 90 tablet 2   lisinopril (ZESTRIL) 5 MG tablet TAKE 1 TABLET (5 MG TOTAL) BY MOUTH DAILY. PLEASE KEEP UPCOMING APPT FOR FUTURE REFILLS. 90 tablet 1   nitroGLYCERIN (NITROSTAT) 0.4 MG SL tablet Place 1 tablet (0.4 mg total) under the tongue every 5 (five) minutes as needed for chest pain. 25 tablet 6   ondansetron (ZOFRAN-ODT) 4 MG disintegrating tablet Take 1 tablet (4 mg total) by mouth every 8 (eight) hours as needed for nausea or vomiting. 15 tablet 0   OVER THE COUNTER MEDICATION Place 1 drop into both eyes 2 (two) times daily. liquid msm eye drops/ for floaters in the eyes     Semaglutide, 1 MG/DOSE, (OZEMPIC, 1 MG/DOSE,) 4 MG/3ML SOPN Inject 1 mg into the skin once a week. Via Novo Cares PAP     tamsulosin (FLOMAX) 0.4 MG CAPS capsule TAKE 1 CAPSULE (0.4 MG TOTAL) BY MOUTH  DAILY. FOR URINE FLOW. 90 capsule 1   tiZANidine  (ZANAFLEX) 4 MG tablet Take 0.5-1 tablets (2-4 mg total) by mouth at bedtime as needed for muscle spasms. 30 tablet 2   No current facility-administered medications on file prior to visit.    BP 122/76   Pulse 77   Temp (!) 97.5 F (36.4 C) (Temporal)   Ht 5' 8"  (1.727 m)   Wt 196 lb (88.9 kg)   SpO2 98%   BMI 29.80 kg/m  Objective:   Physical Exam Constitutional:      Appearance: He is not ill-appearing.  Cardiovascular:     Rate and Rhythm: Normal rate and regular rhythm.  Pulmonary:     Effort: Pulmonary effort is normal.     Breath sounds: Examination of the right-upper field reveals rhonchi. Examination of the left-upper field reveals rhonchi. Examination of the left-lower field reveals rhonchi. Rhonchi present. No wheezing or rales.  Musculoskeletal:     Cervical back: Neck supple.  Skin:    General: Skin is warm and dry.  Neurological:     Mental Status: He is alert and oriented to person, place, and time.           Assessment & Plan:   Problem List Items Addressed This Visit       Endocrine   DM type 2 (diabetes mellitus, type 2) ([place]) - Primary    No improvement with A1C today of 8.1.  Poor historian, does not bring meds to visit today.  Discussed to move Tresiba to evening and inject full 100 units. He never injected 80 units as instructed last visit. Remain off of Novolog.  Continue Farxiga 10 mg daily.  Resume Ozempic. He has 0.5 mg and 1 mg at home, will resume with 0.5 mg weekly for a few weeks first. I've asked for him to update me via MyChart in 1 month with glucose readings.  Follow up in 3 months.       Relevant Medications   insulin degludec (TRESIBA FLEXTOUCH) 200 UNIT/ML FlexTouch Pen   Other Relevant Orders   POCT glycosylated hemoglobin (Hb A1C) (Completed)   Microalbumin/Creatinine Ratio, Urine       [name], NP

## 2021-12-22 NOTE — Assessment & Plan Note (Signed)
No improvement with A1C today of 8.1.  Poor historian, does not bring meds to visit today.  Discussed to move Tresiba to evening and inject full 100 units. He never injected 80 units as instructed last visit. Remain off of Novolog.  Continue Farxiga 10 mg daily.  Resume Ozempic. He has 0.5 mg and 1 mg at home, will resume with 0.5 mg weekly for a few weeks first. I've asked for him to update me via MyChart in 1 month with glucose readings.  Follow up in 3 months.

## 2021-12-22 NOTE — Patient Instructions (Signed)
Start taking Tresiba insulin in the evening. Inject 100 units.   Remain off of Novolog insulin.  Continue Farxiga daily.  Resume Ozempic at 0.5 mg weekly for now.  Please send me blood sugar readings in 1 month via MyChart.  Please schedule a physical to meet with me in 3 months.   It was a pleasure to see you today!

## 2022-01-04 ENCOUNTER — Ambulatory Visit (INDEPENDENT_AMBULATORY_CARE_PROVIDER_SITE_OTHER): Payer: Medicare Other

## 2022-01-04 DIAGNOSIS — I255 Ischemic cardiomyopathy: Secondary | ICD-10-CM

## 2022-01-05 LAB — CUP PACEART REMOTE DEVICE CHECK
Battery Remaining Longevity: 115 mo
Battery Voltage: 3.01 V
Brady Statistic RV Percent Paced: 0.01 %
Date Time Interrogation Session: 20240103033622
HighPow Impedance: 67 Ohm
Implantable Lead Connection Status: 753985
Implantable Lead Implant Date: 20210105
Implantable Lead Location: 753860
Implantable Pulse Generator Implant Date: 20210105
Lead Channel Impedance Value: 399 Ohm
Lead Channel Impedance Value: 494 Ohm
Lead Channel Pacing Threshold Amplitude: 0.5 V
Lead Channel Pacing Threshold Pulse Width: 0.4 ms
Lead Channel Sensing Intrinsic Amplitude: 9.375 mV
Lead Channel Sensing Intrinsic Amplitude: 9.375 mV
Lead Channel Setting Pacing Amplitude: 2.5 V
Lead Channel Setting Pacing Pulse Width: 0.4 ms
Lead Channel Setting Sensing Sensitivity: 0.3 mV
Zone Setting Status: 755011
Zone Setting Status: 755011

## 2022-01-06 ENCOUNTER — Other Ambulatory Visit: Payer: Self-pay | Admitting: Primary Care

## 2022-01-06 DIAGNOSIS — N400 Enlarged prostate without lower urinary tract symptoms: Secondary | ICD-10-CM

## 2022-01-11 ENCOUNTER — Ambulatory Visit (INDEPENDENT_AMBULATORY_CARE_PROVIDER_SITE_OTHER): Payer: Medicare Other

## 2022-01-11 VITALS — Wt 196.0 lb

## 2022-01-11 DIAGNOSIS — Z0000 Encounter for general adult medical examination without abnormal findings: Secondary | ICD-10-CM | POA: Diagnosis not present

## 2022-01-11 NOTE — Patient Instructions (Signed)
[name] , Thank you for taking time to come for your Medicare Wellness Visit. I appreciate your ongoing commitment to your health goals. Please review the following plan we discussed and let me know if I can assist you in the future.   These are the goals we discussed:  Goals      Increase physical activity     Starting 09/05/2017, I will continue to play golf for at least 4 hours 3 days per week and do yard work as needed.      Patient Stated     Would like to maintain current routine     Patient Stated     Get to feeling better     Weight (lb) < 200 lb (90.7 kg)     09/13/2018, wants to get to 180 pounds        This is a list of the screening recommended for you and due dates:  Health Maintenance  Topic Date Due   Eye exam for diabetics  11/25/2020   COVID-19 Vaccine (4 - 2023-24 season) 09/02/2021   Hemoglobin A1C  06/23/2022   Yearly kidney function blood test for diabetes  12/01/2022   Yearly kidney health urinalysis for diabetes  12/23/2022   Complete foot exam   12/23/2022   Medicare Annual Wellness Visit  01/12/2023   Pneumonia Vaccine  Completed   Flu Shot  Completed   Hepatitis C Screening: USPSTF Recommendation to screen - Ages 18-79 yo.  Completed   Zoster (Shingles) Vaccine  Completed   HPV Vaccine  Aged Out   DTaP/Tdap/Td vaccine  Discontinued   Colon Cancer Screening  Discontinued    Advanced directives: Please bring a copy of your health care power of attorney and living will to the office at your convenience.  Conditions/risks identified: get to feeling better   Next appointment: Follow up in one year for your annual wellness visit.   Preventive Care 45 Years and Older, Male  Preventive care refers to lifestyle choices and visits with your health care provider that can promote health and wellness. What does preventive care include? A yearly physical exam. This is also called an annual well check. Dental exams once or twice a year. Routine eye exams.  Ask your health care provider how often you should have your eyes checked. Personal lifestyle choices, including: Daily care of your teeth and gums. Regular physical activity. Eating a healthy diet. Avoiding tobacco and drug use. Limiting alcohol use. Practicing safe sex. Taking low doses of aspirin every day. Taking vitamin and mineral supplements as recommended by your health care provider. What happens during an annual well check? The services and screenings done by your health care provider during your annual well check will depend on your age, overall health, lifestyle risk factors, and family history of disease. Counseling  Your health care provider may ask you questions about your: Alcohol use. Tobacco use. Drug use. Emotional well-being. Home and relationship well-being. Sexual activity. Eating habits. History of falls. Memory and ability to understand (cognition). Work and work Statistician. Screening  You may have the following tests or measurements: Height, weight, and BMI. Blood pressure. Lipid and cholesterol levels. These may be checked every 5 years, or more frequently if you are over 109 years old. Skin check. Lung cancer screening. You may have this screening every year starting at age 25 if you have a 30-pack-year history of smoking and currently smoke or have quit within the past 15 years. Fecal occult blood test (  FOBT) of the stool. You may have this test every year starting at age 51. Flexible sigmoidoscopy or colonoscopy. You may have a sigmoidoscopy every 5 years or a colonoscopy every 10 years starting at age 13. Prostate cancer screening. Recommendations will vary depending on your family history and other risks. Hepatitis C blood test. Hepatitis B blood test. Sexually transmitted disease (STD) testing. Diabetes screening. This is done by checking your blood sugar (glucose) after you have not eaten for a while (fasting). You may have this done every 1-3  years. Abdominal aortic aneurysm (AAA) screening. You may need this if you are a current or former smoker. Osteoporosis. You may be screened starting at age 30 if you are at high risk. Talk with your health care provider about your test results, treatment options, and if necessary, the need for more tests. Vaccines  Your health care provider may recommend certain vaccines, such as: Influenza vaccine. This is recommended every year. Tetanus, diphtheria, and acellular pertussis (Tdap, Td) vaccine. You may need a Td booster every 10 years. Zoster vaccine. You may need this after age 85. Pneumococcal 13-valent conjugate (PCV13) vaccine. One dose is recommended after age 35. Pneumococcal polysaccharide (PPSV23) vaccine. One dose is recommended after age 80. Talk to your health care provider about which screenings and vaccines you need and how often you need them. This information is not intended to replace advice given to you by your health care provider. Make sure you discuss any questions you have with your health care provider. Document Released: 01/15/2015 Document Revised: 09/08/2015 Document Reviewed: 10/20/2014 Elsevier Interactive Patient Education  2017 [place] Prevention in the Home Falls can cause injuries. They can happen to people of all ages. There are many things you can do to make your home safe and to help prevent falls. What can I do on the outside of my home? Regularly fix the edges of walkways and driveways and fix any cracks. Remove anything that might make you trip as you walk through a door, such as a raised step or threshold. Trim any bushes or trees on the path to your home. Use bright outdoor lighting. Clear any walking paths of anything that might make someone trip, such as rocks or tools. Regularly check to see if handrails are loose or broken. Make sure that both sides of any steps have handrails. Any raised decks and porches should have guardrails on the  edges. Have any leaves, snow, or ice cleared regularly. Use sand or salt on walking paths during winter. Clean up any spills in your garage right away. This includes oil or grease spills. What can I do in the bathroom? Use night lights. Install grab bars by the toilet and in the tub and shower. Do not use towel bars as grab bars. Use non-skid mats or decals in the tub or shower. If you need to sit down in the shower, use a plastic, non-slip stool. Keep the floor dry. Clean up any water that spills on the floor as soon as it happens. Remove soap buildup in the tub or shower regularly. Attach bath mats securely with double-sided non-slip rug tape. Do not have throw rugs and other things on the floor that can make you trip. What can I do in the bedroom? Use night lights. Make sure that you have a light by your bed that is easy to reach. Do not use any sheets or blankets that are too big for your bed. They should not hang down  onto the floor. Have a firm chair that has side arms. You can use this for support while you get dressed. Do not have throw rugs and other things on the floor that can make you trip. What can I do in the kitchen? Clean up any spills right away. Avoid walking on wet floors. Keep items that you use a lot in easy-to-reach places. If you need to reach something above you, use a strong step stool that has a grab bar. Keep electrical cords out of the way. Do not use floor polish or wax that makes floors slippery. If you must use wax, use non-skid floor wax. Do not have throw rugs and other things on the floor that can make you trip. What can I do with my stairs? Do not leave any items on the stairs. Make sure that there are handrails on both sides of the stairs and use them. Fix handrails that are broken or loose. Make sure that handrails are as long as the stairways. Check any carpeting to make sure that it is firmly attached to the stairs. Fix any carpet that is loose or  worn. Avoid having throw rugs at the top or bottom of the stairs. If you do have throw rugs, attach them to the floor with carpet tape. Make sure that you have a light switch at the top of the stairs and the bottom of the stairs. If you do not have them, ask someone to add them for you. What else can I do to help prevent falls? Wear shoes that: Do not have high heels. Have rubber bottoms. Are comfortable and fit you well. Are closed at the toe. Do not wear sandals. If you use a stepladder: Make sure that it is fully opened. Do not climb a closed stepladder. Make sure that both sides of the stepladder are locked into place. Ask someone to hold it for you, if possible. Clearly mark and make sure that you can see: Any grab bars or handrails. First and last steps. Where the edge of each step is. Use tools that help you move around (mobility aids) if they are needed. These include: Canes. Walkers. Scooters. Crutches. Turn on the lights when you go into a dark area. Replace any light bulbs as soon as they burn out. Set up your furniture so you have a clear path. Avoid moving your furniture around. If any of your floors are uneven, fix them. If there are any pets around you, be aware of where they are. Review your medicines with your doctor. Some medicines can make you feel dizzy. This can increase your chance of falling. Ask your doctor what other things that you can do to help prevent falls. This information is not intended to replace advice given to you by your health care provider. Make sure you discuss any questions you have with your health care provider. Document Released: 10/15/2008 Document Revised: 05/27/2015 Document Reviewed: 01/23/2014 Elsevier Interactive Patient Education  2017 [employer].

## 2022-01-11 NOTE — Progress Notes (Signed)
I connected with  [name] on 01/11/22 by a audio enabled telemedicine application and verified that I am speaking with the correct person using two identifiers.  Patient Location: Home  Provider Location: Home Office  I discussed the limitations of evaluation and management by telemedicine. The patient expressed understanding and agreed to proceed.  Subjective:   [name] is a 77 y.o. male who presents for Medicare Annual/Subsequent preventive examination.  Review of Systems     Cardiac Risk Factors include: advanced age (>1mn, >>44women);diabetes mellitus;dyslipidemia;hypertension;male gender     Objective:    Today's Vitals   01/11/22 1517  Weight: 196 lb (88.9 kg)   Body mass index is 29.8 kg/m.     01/11/2022    3:23 PM 01/07/2021    3:31 PM 02/13/2020    8:47 AM 01/06/2019   12:00 AM 09/13/2018   11:20 AM 09/05/2017    3:57 PM 08/24/2016    8:56 AM  Advanced Directives  Does Patient Have a Medical Advance Directive? Yes Yes Yes Yes Yes Yes Yes  Type of AParamedicof AJosephLiving will HLa HarpeLiving will Living will HSugarmill WoodsLiving will HHarrisonLiving will HBathLiving will HBronsonLiving will  Does patient want to make changes to medical advance directive?  Yes (MAU/Ambulatory/Procedural Areas - Information given)  No - Patient declined     Copy of HTatumin Chart? No - copy requested   No - copy requested No - copy requested No - copy requested No - copy requested    Current Medications (verified) Outpatient Encounter Medications as of 01/11/2022  Medication Sig   albuterol (VENTOLIN HFA) 108 (90 Base) MCG/ACT inhaler Inhale 2 puffs into the lungs every 6 (six) hours as needed for wheezing or shortness of breath.   carvedilol (COREG) 6.25 MG tablet TAKE 1 TABLET BY MOUTH TWICE A DAY WITH MEAL   Continuous  Blood Gluc Receiver (FREESTYLE LIBRE 2 READER) DEVI Use to check blood sugars.   Continuous Blood Gluc Sensor (FREESTYLE LIBRE 2 SENSOR) MISC Apply every 14 days to check blood sugars.   dapagliflozin propanediol (FARXIGA) 10 MG TABS tablet Take 1 tablet (10 mg total) by mouth daily before breakfast. For diabetes.   ELIQUIS 5 MG TABS tablet TAKE 1 TABLET BY MOUTH TWICE A DAY   insulin degludec (TRESIBA FLEXTOUCH) 200 UNIT/ML FlexTouch Pen Inject 100 Units into the skin daily. Via Novo Cares PAP   Insulin Pen Needle 31G X 8 MM MISC Use as directed with Lantus.   isosorbide mononitrate (IMDUR) 30 MG 24 hr tablet TAKE 1 TABLET BY MOUTH EVERY DAY   lisinopril (ZESTRIL) 5 MG tablet TAKE 1 TABLET (5 MG TOTAL) BY MOUTH DAILY. PLEASE KEEP UPCOMING APPT FOR FUTURE REFILLS.   nitroGLYCERIN (NITROSTAT) 0.4 MG SL tablet Place 1 tablet (0.4 mg total) under the tongue every 5 (five) minutes as needed for chest pain.   ondansetron (ZOFRAN-ODT) 4 MG disintegrating tablet Take 1 tablet (4 mg total) by mouth every 8 (eight) hours as needed for nausea or vomiting.   OVER THE COUNTER MEDICATION Place 1 drop into both eyes 2 (two) times daily. liquid msm eye drops/ for floaters in the eyes   Semaglutide, 1 MG/DOSE, (OZEMPIC, 1 MG/DOSE,) 4 MG/3ML SOPN Inject 1 mg into the skin once a week. Via Novo Cares PAP   tamsulosin (FLOMAX) 0.4 MG CAPS capsule TAKE 1 CAPSULE (0.4  MG TOTAL) BY MOUTH DAILY. FOR URINE FLOW.   tiZANidine (ZANAFLEX) 4 MG tablet Take 0.5-1 tablets (2-4 mg total) by mouth at bedtime as needed for muscle spasms.   [DISCONTINUED] acetaminophen (TYLENOL) 325 MG tablet Take 1-2 tablets (325-650 mg total) by mouth every 4 (four) hours as needed for mild pain.   No facility-administered encounter medications on file as of 01/11/2022.    Allergies (verified) Lipitor [atorvastatin], Pravastatin, and Simvastatin   History: Past Medical History:  Diagnosis Date   Arthritis    "fingers, shoulders" (08/03/2015)    Atrial fibrillation (HCC)    Paroxysmal, rare episodes, sinus rhythm on flecainide, patient prefers not to take Coumadin   Bradycardia    April, 2013   Chest pain    Nuclear, 2006, no ischemia   Chronic lower back pain    "all my life"   Coronary artery disease    s/p staged cath 05/12/2014 and 5/11, DES x 2 to heavily calcified RCA, residual with 60% prox LAD, 70% D1   Ejection fraction    EF 55-60%,  echo, January, 2011   Elevated CPK    CPK elevated with normal MB and normal troponin the past   Heart murmur    History of echocardiogram    Echo 8/16: EF 55%, normal wall motion, grade 2 diastolic dysfunction, trivial MR, normal RV function, PASP 27 mmHg   Hypertension    IBS (irritable bowel syndrome)    Myocardial infarction ([place]) 06/2014   Sinus drainage    Type II diabetes mellitus ([place])    Past Surgical History:  Procedure Laterality Date   APPENDECTOMY     CARDIAC CATHETERIZATION N/A 05/12/2014   Procedure: Left Heart Cath and Coronary Angiography;  Surgeon: Troy Sine, MD;  Location: [place] [facility];  Service: Cardiovascular;  Laterality: N/A;   CARDIAC CATHETERIZATION N/A 05/13/2014   Procedure: Coronary/Graft Atherectomy;  Surgeon: Troy Sine, MD;  Location: [facility] [facility];  Service: Cardiovascular;  Laterality: N/A;   CARDIAC CATHETERIZATION Right 05/13/2014   Procedure: Temporary Pacemaker;  Surgeon: Troy Sine, MD;  Location: [place] [facility];  Service: Cardiovascular;  Laterality: Right;   CARDIAC CATHETERIZATION N/A 05/13/2014   Procedure: Coronary Stent Intervention;  Surgeon: Troy Sine, MD;  Location: [place] [facility];  Service: Cardiovascular;  Laterality: N/A;   CARDIAC CATHETERIZATION N/A 06/18/2014   Procedure: Left Heart Cath and Coronary Angiography;  Surgeon: Peter M Martinique, MD;  Location: [place] [facility];  Service: Cardiovascular;  Laterality: N/A;   CARDIAC CATHETERIZATION N/A 08/03/2015   Procedure: Left Heart Cath and  Cors/Grafts Angiography;  Surgeon: Nelva Bush, MD;  Location: [place] [facility];  Service: Cardiovascular;  Laterality: N/A;   CARDIOVASCULAR STRESS TEST  05/06/2014   CATARACT EXTRACTION W/ INTRAOCULAR LENS IMPLANT Left    COLONOSCOPY WITH PROPOFOL N/A 02/13/2020   Procedure: COLONOSCOPY WITH PROPOFOL;  Surgeon: Carol Ada, MD;  Location: [facility];  Service: Endoscopy;  Laterality: N/A;   CORONARY ARTERY BYPASS GRAFT N/A 06/22/2014   Procedure: CORONARY ARTERY BYPASS GRAFTING (CABG) x five, using left internal mammary artery, and right leg greater saphenous vein harvested endoscopically;  Surgeon: Melrose Nakayama, MD;  Location: [place];  Service: Open Heart Surgery;  Laterality: N/A;   ICD IMPLANT N/A 01/07/2019   Procedure: ICD IMPLANT;  Surgeon: Thompson Grayer, MD;  Location: [place] [facility];  Service: Cardiovascular;  Laterality: N/A;   LAPAROSCOPIC CHOLECYSTECTOMY     LEFT HEART  CATH AND CORS/GRAFTS ANGIOGRAPHY N/A 01/06/2019   Procedure: LEFT HEART CATH AND CORS/GRAFTS ANGIOGRAPHY;  Surgeon: Nelva Bush, MD;  Location: [place] [facility];  Service: Cardiovascular;  Laterality: N/A;   MAZE N/A 06/22/2014   Procedure: MAZE;  Surgeon: Melrose Nakayama, MD;  Location: [place];  Service: Open Heart Surgery;  Laterality: N/A;   POLYPECTOMY  02/13/2020   Procedure: POLYPECTOMY;  Surgeon: Carol Ada, MD;  [facility];  Service: Endoscopy;;   TEE WITHOUT CARDIOVERSION N/A 06/22/2014   Procedure: TRANSESOPHAGEAL ECHOCARDIOGRAM (TEE);  Surgeon: Melrose Nakayama, MD;  Location: [place];  Service: Open Heart Surgery;  Laterality: N/A;   Family History  Problem Relation Age of Onset   Alzheimer's disease Mother    Emphysema Father    Cancer Brother    Arrhythmia Sister    Heart attack Sister    Heart disease Sister    Hyperlipidemia Sister    Hypertension Sister    Social History   Socioeconomic History   Marital status: Married    Spouse name: Not on file    Number of children: Not on file   Years of education: Not on file   Highest education level: Not on file  Occupational History   Not on file  Tobacco Use   Smoking status: Never   Smokeless tobacco: Never  Vaping Use   Vaping Use: Never used  Substance and Sexual Activity   Alcohol use: No   Drug use: No   Sexual activity: Not on file  Other Topics Concern   Not on file  Social History Narrative   Not on file   Social Determinants of Health   Financial Resource Strain: Low Risk  (01/11/2022)   Overall Financial Resource Strain (CARDIA)    Difficulty of Paying Living Expenses: Not hard at all  Food Insecurity: No Food Insecurity (01/11/2022)   Hunger Vital Sign    Worried About Running Out of Food in the Last Year: Never true    [place] in the Last Year: Never true  Transportation Needs: No Transportation Needs (01/11/2022)   PRAPARE - Hydrologist (Medical): No    Lack of Transportation (Non-Medical): No  Physical Activity: Sufficiently Active (01/11/2022)   Exercise Vital Sign    Days of Exercise per Week: 3 days    Minutes of Exercise per Session: 150+ min  Stress: No Stress Concern Present (01/11/2022)   [place]    Feeling of Stress : Not at all  Social Connections: [place] (01/11/2022)   Social Connection and Isolation Panel [NHANES]    Frequency of Communication with Friends and Family: More than three times a week    Frequency of Social Gatherings with Friends and Family: More than three times a week    Attends Religious Services: More than 4 times per year    Active Member of [employer] or Organizations: Yes    Attends Music therapist: More than 4 times per year    Marital Status: Married    Tobacco Counseling Counseling given: Not Answered   Clinical Intake:  Pre-visit preparation completed: Yes  Pain : No/denies pain     BMI -  recorded: 29.8 Nutritional Status: BMI 25 -29 Overweight Nutritional Risks: None Diabetes: Yes CBG done?: Yes (200 per pt) CBG resulted in Enter/ Edit results?: No Did pt. bring in CBG monitor from home?: No  How often do you  need to have someone help you when you read instructions, pamphlets, or other written materials from your doctor or pharmacy?: 1 - Never  Diabetic?Nutrition Risk Assessment:  Has the patient had any N/V/D within the last 2 months?  No  Does the patient have any non-healing wounds?  No  Has the patient had any unintentional weight loss or weight gain?  No   Diabetes:  Is the patient diabetic?  Yes  If diabetic, was a CBG obtained today?  Yes  Did the patient bring in their glucometer from home?  No  How often do you monitor your CBG's? daily.   Financial Strains and Diabetes Management:  Are you having any financial strains with the device, your supplies or your medication? No .  Does the patient want to be seen by Chronic Care Management for management of their diabetes?  No  Would the patient like to be referred to a Nutritionist or for Diabetic Management?  No   Diabetic Exams:  Diabetic Eye Exam: Overdue for diabetic eye exam. Pt has been advised about the importance in completing this exam. Patient advised to call and schedule an eye exam. Diabetic Foot Exam: Completed 12/22/21   Interpreter Needed?: No  Information entered by :: Charlott Rakes, LPN   Activities of Daily Living    01/11/2022    3:24 PM  In your present state of health, do you have any difficulty performing the following activities:  Hearing? 1  Comment hearing aids coming  Vision? 0  Difficulty concentrating or making decisions? 0  Walking or climbing stairs? 0  Dressing or bathing? 0  Doing errands, shopping? 0  Preparing Food and eating ? N  Using the Toilet? N  In the past six months, have you accidently leaked urine? N  Do you have problems with loss of bowel  control? N  Managing your Medications? N  Managing your Finances? N  Housekeeping or managing your Housekeeping? N    Patient Care Team: [name], NP as PCP - General (Internal Medicine) Jettie Booze, MD as PCP - Cardiology (Cardiology) Marica Otter, [place] (Optometry) [place], Cleaster Corin, [facility] as Pharmacist (Pharmacist)  Indicate any recent Medical Services you may have received from other than [facility] providers in the past year (date may be approximate).     Assessment:   This is a routine wellness examination for [name].  Hearing/Vision screen Hearing Screening - Comments:: Waiting on hearing aids  Vision Screening - Comments:: Pt follows up with Dr Migdalia Dk for annual eye exams   Dietary issues and exercise activities discussed: Current Exercise Habits: Home exercise routine, Type of exercise: walking;Other - see comments, Time (Minutes): > 60, Frequency (Times/Week): 3, Weekly Exercise (Minutes/Week): 0   Goals Addressed             This Visit's Progress    Patient Stated       Get to feeling better       Depression Screen    01/11/2022    3:22 PM 01/07/2021    3:34 PM 08/25/2020    8:20 AM 11/26/2019    7:17 AM 09/13/2018   11:20 AM 09/05/2017    3:58 PM 08/24/2016    8:56 AM  PHQ 2/9 Scores  PHQ - 2 Score 0 0 0 0 0 0 2  PHQ- 9 Score   0 0 0 0 2    Fall Risk    01/11/2022    3:24 PM 01/07/2021    3:33 PM  08/25/2020    8:19 AM 11/26/2019    7:17 AM 09/13/2018   11:20 AM  Fall Risk   Falls in the past year? 1 0 0 0 0  Number falls in past yr: 1 0 0 0   Injury with Fall? 0 0 0 0   Risk for fall due to : Impaired vision No Fall Risks   Medication side effect  Follow up Falls prevention discussed Falls prevention discussed   Falls evaluation completed;Falls prevention discussed    FALL RISK PREVENTION PERTAINING TO THE HOME:  Any stairs in or around the home? Yes  If so, are there any without handrails? No  Home free of loose throw rugs in  walkways, pet beds, electrical cords, etc? Yes  Adequate lighting in your home to reduce risk of falls? Yes   ASSISTIVE DEVICES UTILIZED TO PREVENT FALLS:  Life alert? No  Use of a cane, walker or w/c? No  Grab bars in the bathroom? Yes  Shower chair or bench in shower? Yes  Elevated toilet seat or a handicapped toilet? Yes   TIMED UP AND GO:  Was the test performed? No .   Cognitive Function:    09/13/2018   11:22 AM 09/05/2017    3:57 PM 08/24/2016    8:56 AM  MMSE - Mini Mental State Exam  Orientation to time '5 5 5  '$ Orientation to Place '5 5 5  '$ Registration '3 3 3  '$ Attention/ Calculation 5 0 0  Recall '3 3 2  '$ Recall-comments   pt was unable to recall 1 of 3 words  Language- name 2 objects 0 0 0  Language- repeat '1 1 1  '$ Language- follow 3 step command 0 3 3  Language- read & follow direction 0 0 0  Write a sentence 0 0 0  Copy design 0 0 0  Total score '22 20 19        '$ 01/11/2022    3:25 PM  6CIT Screen  What Year? 0 points  What month? 0 points  What time? 0 points  Count back from 20 0 points  Months in reverse 4 points  Repeat phrase 2 points  Total Score 6 points    Immunizations Immunization History  Administered Date(s) Administered   Fluad Quad(high Dose 65+) 10/28/2019, 09/19/2021   Influenza Split 11/02/2013   Influenza,inj,Quad PF,6+ Mos 11/08/2015, 10/24/2016, 09/07/2017, 09/17/2018   Influenza-Unspecified 11/02/2012   PFIZER(Purple Top)SARS-COV-2 Vaccination 02/28/2019, 03/26/2019, 10/28/2019   Pneumococcal Conjugate-13 12/11/2014   Pneumococcal Polysaccharide-23 05/25/2005, 08/24/2016   Tdap 01/16/2011   Zoster Recombinat (Shingrix) 09/21/2018, 12/18/2018   Zoster, Live 03/02/2013    TDAP status: Due, Education has been provided regarding the importance of this vaccine. Advised may receive this vaccine at local pharmacy or Health Dept. Aware to provide a copy of the vaccination record if obtained from local pharmacy or Health Dept. Verbalized  acceptance and understanding.  Flu Vaccine status: Up to date  Pneumococcal vaccine status: Up to date  Covid-19 vaccine status: Completed vaccines  Qualifies for Shingles Vaccine? Yes   Zostavax completed Yes   Shingrix Completed?: Yes  Screening Tests Health Maintenance  Topic Date Due   OPHTHALMOLOGY EXAM  11/25/2020   COVID-19 Vaccine (4 - 2023-24 season) 09/02/2021   HEMOGLOBIN A1C  06/23/2022   Diabetic kidney evaluation - eGFR measurement  12/01/2022   Diabetic kidney evaluation - Urine ACR  12/23/2022   FOOT EXAM  12/23/2022   Medicare Annual Wellness (AWV)  01/12/2023  Pneumonia Vaccine 98+ Years old  Completed   INFLUENZA VACCINE  Completed   Hepatitis C Screening  Completed   Zoster Vaccines- Shingrix  Completed   HPV VACCINES  Aged Out   DTaP/Tdap/Td  Discontinued   COLONOSCOPY (Pts 45-65yr Insurance coverage will need to be confirmed)  Discontinued    Health Maintenance  Health Maintenance Due  Topic Date Due   OPHTHALMOLOGY EXAM  11/25/2020   COVID-19 Vaccine (4 - 2023-24 season) 09/02/2021    Colorectal cancer screening: No longer required.   Additional Screening:  Hepatitis C Screening: Completed 03/10/15  Vision Screening: Recommended annual ophthalmology exams for early detection of glaucoma and other disorders of the eye. Is the patient up to date with their annual eye exam?  Yes  Who is the provider or what is the name of the office in which the patient attends annual eye exams? Dr MSabra Heck If pt is not established with a provider, would they like to be referred to a provider to establish care? No .   Dental Screening: Recommended annual dental exams for proper oral hygiene  Community Resource Referral / Chronic Care Management: CRR required this visit?  No   CCM required this visit?  No      Plan:     I have personally reviewed and noted the following in the patient's chart:   Medical and social history Use of alcohol, tobacco or  illicit drugs  Current medications and supplements including opioid prescriptions. Patient is not currently taking opioid prescriptions. Functional ability and status Nutritional status Physical activity Advanced directives List of other physicians Hospitalizations, surgeries, and ER visits in previous 12 months Vitals Screenings to include cognitive, depression, and falls Referrals and appointments  In addition, I have reviewed and discussed with patient certain preventive protocols, quality metrics, and best practice recommendations. A written personalized care plan for preventive services as well as general preventive health recommendations were provided to patient.     TWillette Brace LPN   [id number]  Nurse Notes: none

## 2022-01-19 ENCOUNTER — Telehealth: Payer: Self-pay

## 2022-01-19 NOTE — Telephone Encounter (Signed)
Called and notified patient that we received a shipment from patient assistance for 5 boxes of 32G needles at 100 count each.  He will come by to pick these up. These are located by my desk.

## 2022-01-20 NOTE — Telephone Encounter (Signed)
Pt came in office to pick up 5 boxes of 32G needles

## 2022-01-24 ENCOUNTER — Telehealth: Payer: Self-pay | Admitting: Primary Care

## 2022-01-24 DIAGNOSIS — E1159 Type 2 diabetes mellitus with other circulatory complications: Secondary | ICD-10-CM

## 2022-01-24 MED ORDER — FREESTYLE LIBRE 3 SENSOR MISC: 1 refills | Status: DC

## 2022-01-24 NOTE — Addendum Note (Signed)
Addended by: [name] on: 01/24/2022 04:26 PM   Modules accepted: Orders

## 2022-01-24 NOTE — Telephone Encounter (Signed)
Sure, will send Rx for [place] 3. He will only need to pick up sensors if he uses his smartphone for readings.

## 2022-01-24 NOTE — Telephone Encounter (Signed)
Patient called in and was wanting to know if he can change to the [place] 3. He stated that the [place] 2 is giving him a lot of problems. Please advise. Thank you!

## 2022-01-25 NOTE — Telephone Encounter (Signed)
Patient notified that Elenor [place] 3 was sent in to [facility] in [place].

## 2022-01-29 NOTE — Progress Notes (Unsigned)
Cardiology Office Note Date:  01/29/2022  Patient ID:  [name], [name] 03-06-1945, MRN [id number] PCP:  [name], NP  Cardiologist:  Dr. Irish Lack Electrophysiologist: Dr. Rayann Heman >>> pending Dr. Quentin Ore  ***refresh   Chief Complaint: *** increased AFib burden  History of Present Illness: [name] is a 77 y.o. male with history of MI, CAD (PCI 2016, CABG 2016, NSTEMI 2017 > occl RCA graft medically managed, 2021 cath with some progressive/relatively stable CAD no intervention), HTN, HLD, DM, AFib (MAZE and LAA clip w/CABG in 2016), ICM w/ICD  He saw Dr. Rayann Heman Aug 2022, feeling well, felt to be euvolemic, low Afib burden, maintained on [place], otherwise meds deferred to Dr. Iran Sizer.  He saw Dr. Irish Lack 04/27/21, feeling well, lipids meds stopped historically 2/2 myalgias, planned for lipid clinic referral, no changes otherwise   At his last device remote, noted AFib burden 95.9% Dr. Quentin Ore recommended he come in and review AF management strategies.  *** rates *** symptoms *** volume *** eliquis, dose, labs, bleeding *** is burden new since about June *** update echo with new high AF burden *** ablation candidate? AAd options are amio, tikosyn (zofran) *** DCCV 1st   Device information MDT single chamber ICd implanted 01/07/19   Past Medical History:  Diagnosis Date   Arthritis    "fingers, shoulders" (08/03/2015)   Atrial fibrillation (HCC)    Paroxysmal, rare episodes, sinus rhythm on flecainide, patient prefers not to take Coumadin   Bradycardia    April, 2013   Chest pain    Nuclear, 2006, no ischemia   Chronic lower back pain    "all my life"   Coronary artery disease    s/p staged cath 05/12/2014 and 5/11, DES x 2 to heavily calcified RCA, residual with 60% prox LAD, 70% D1   Ejection fraction    EF 55-60%,  echo, January, 2011   Elevated CPK    CPK elevated with normal MB and normal troponin the past   Heart murmur    History of  echocardiogram    Echo 8/16: EF 55%, normal wall motion, grade 2 diastolic dysfunction, trivial MR, normal RV function, PASP 27 mmHg   Hypertension    IBS (irritable bowel syndrome)    Myocardial infarction ([place]) 06/2014   Sinus drainage    Type II diabetes mellitus ([place])     Past Surgical History:  Procedure Laterality Date   APPENDECTOMY     CARDIAC CATHETERIZATION N/A 05/12/2014   Procedure: Left Heart Cath and Coronary Angiography;  Surgeon: Troy Sine, MD;  Location: [place] [facility];  Service: Cardiovascular;  Laterality: N/A;   CARDIAC CATHETERIZATION N/A 05/13/2014   Procedure: Coronary/Graft Atherectomy;  Surgeon: Troy Sine, MD;  Location: [place] [facility];  Service: Cardiovascular;  Laterality: N/A;   CARDIAC CATHETERIZATION Right 05/13/2014   Procedure: Temporary Pacemaker;  Surgeon: Troy Sine, MD;  Location: [place] [facility];  Service: Cardiovascular;  Laterality: Right;   CARDIAC CATHETERIZATION N/A 05/13/2014   Procedure: Coronary Stent Intervention;  Surgeon: Troy Sine, MD;  Location: [place] [facility];  Service: Cardiovascular;  Laterality: N/A;   CARDIAC CATHETERIZATION N/A 06/18/2014   Procedure: Left Heart Cath and Coronary Angiography;  Surgeon: Peter M Martinique, MD;  Location: [place] [facility];  Service: Cardiovascular;  Laterality: N/A;   CARDIAC CATHETERIZATION N/A 08/03/2015   Procedure: Left Heart Cath and Cors/Grafts Angiography;  Surgeon: Nelva Bush, MD;  Location: [facility]  INVASIVE [facility];  Service: Cardiovascular;  Laterality: N/A;   CARDIOVASCULAR STRESS TEST  05/06/2014   CATARACT EXTRACTION W/ INTRAOCULAR LENS IMPLANT Left    COLONOSCOPY WITH PROPOFOL N/A 02/13/2020   Procedure: COLONOSCOPY WITH PROPOFOL;  Surgeon: Carol Ada, MD;  [facility]: [facility];  Service: Endoscopy;  Laterality: N/A;   CORONARY ARTERY BYPASS GRAFT N/A 06/22/2014   Procedure: CORONARY ARTERY BYPASS GRAFTING (CABG) x five, using left internal mammary artery, and  right leg greater saphenous vein harvested endoscopically;  Surgeon: Melrose Nakayama, MD;  Location: [place];  Service: Open Heart Surgery;  Laterality: N/A;   ICD IMPLANT N/A 01/07/2019   Procedure: ICD IMPLANT;  Surgeon: Thompson Grayer, MD;  Location: [place] [facility];  Service: Cardiovascular;  Laterality: N/A;   LAPAROSCOPIC CHOLECYSTECTOMY     LEFT HEART CATH AND CORS/GRAFTS ANGIOGRAPHY N/A 01/06/2019   Procedure: LEFT HEART CATH AND CORS/GRAFTS ANGIOGRAPHY;  Surgeon: Nelva Bush, MD;  Location: [place] [facility];  Service: Cardiovascular;  Laterality: N/A;   MAZE N/A 06/22/2014   Procedure: MAZE;  Surgeon: Melrose Nakayama, MD;  Location: [place];  Service: Open Heart Surgery;  Laterality: N/A;   POLYPECTOMY  02/13/2020   Procedure: POLYPECTOMY;  Surgeon: Carol Ada, MD;  [facility];  Service: Endoscopy;;   TEE WITHOUT CARDIOVERSION N/A 06/22/2014   Procedure: TRANSESOPHAGEAL ECHOCARDIOGRAM (TEE);  Surgeon: Melrose Nakayama, MD;  Location: [place];  Service: Open Heart Surgery;  Laterality: N/A;    Current Outpatient Medications  Medication Sig Dispense Refill   albuterol (VENTOLIN HFA) 108 (90 Base) MCG/ACT inhaler Inhale 2 puffs into the lungs every 6 (six) hours as needed for wheezing or shortness of breath. 8 g 3   carvedilol (COREG) 6.25 MG tablet TAKE 1 TABLET BY MOUTH TWICE A DAY WITH MEAL 180 tablet 1   Continuous Blood Gluc Receiver (FREESTYLE LIBRE 2 READER) DEVI Use to check blood sugars. 1 each 0   Continuous Blood Gluc Sensor (FREESTYLE LIBRE 3 SENSOR) MISC Place 1 sensor on the skin every 14 days. Use to check glucose continuously 6 each 1   dapagliflozin propanediol (FARXIGA) 10 MG TABS tablet Take 1 tablet (10 mg total) by mouth daily before breakfast. For diabetes. 90 tablet 1   ELIQUIS 5 MG TABS tablet TAKE 1 TABLET BY MOUTH TWICE A DAY 60 tablet 5   insulin degludec (TRESIBA FLEXTOUCH) 200 UNIT/ML FlexTouch Pen Inject 100 Units into the skin daily.  Via Novo Cares PAP 36 mL 0   Insulin Pen Needle 31G X 8 MM MISC Use as directed with Lantus. 100 each 5   isosorbide mononitrate (IMDUR) 30 MG 24 hr tablet TAKE 1 TABLET BY MOUTH EVERY DAY 90 tablet 2   lisinopril (ZESTRIL) 5 MG tablet TAKE 1 TABLET (5 MG TOTAL) BY MOUTH DAILY. PLEASE KEEP UPCOMING APPT FOR FUTURE REFILLS. 90 tablet 1   nitroGLYCERIN (NITROSTAT) 0.4 MG SL tablet Place 1 tablet (0.4 mg total) under the tongue every 5 (five) minutes as needed for chest pain. 25 tablet 6   ondansetron (ZOFRAN-ODT) 4 MG disintegrating tablet Take 1 tablet (4 mg total) by mouth every 8 (eight) hours as needed for nausea or vomiting. 15 tablet 0   OVER THE COUNTER MEDICATION Place 1 drop into both eyes 2 (two) times daily. liquid msm eye drops/ for floaters in the eyes     Semaglutide, 1 MG/DOSE, (OZEMPIC, 1 MG/DOSE,) 4 MG/3ML SOPN Inject 1 mg into the skin once a week. Via  Novo Cares PAP     tamsulosin (FLOMAX) 0.4 MG CAPS capsule TAKE 1 CAPSULE (0.4 MG TOTAL) BY MOUTH DAILY. FOR URINE FLOW. 90 capsule 0   tiZANidine (ZANAFLEX) 4 MG tablet Take 0.5-1 tablets (2-4 mg total) by mouth at bedtime as needed for muscle spasms. 30 tablet 2   No current facility-administered medications for this visit.    Allergies:   Lipitor [atorvastatin], Pravastatin, and Simvastatin   Social History:  The patient  reports that he has never smoked. He has never used smokeless tobacco. He reports that he does not drink alcohol and does not use drugs.   Family History:  The patient's family history includes Alzheimer's disease in his mother; Arrhythmia in his sister; Cancer in his brother; Emphysema in his father; Heart attack in his sister; Heart disease in his sister; Hyperlipidemia in his sister; Hypertension in his sister.***  ROS:  Please see the history of present illness.    All other systems are reviewed and otherwise negative.   PHYSICAL EXAM:  VS:  There were no vitals taken for this visit. BMI: There is no  height or weight on file to calculate BMI. Well nourished, well developed, in no acute distress HEENT: normocephalic, atraumatic Neck: no JVD, carotid bruits or masses Cardiac:  *** RRR; no significant murmurs, no rubs, or gallops Lungs:  *** CTA b/l, no wheezing, rhonchi or rales Abd: soft, nontender MS: no deformity or *** atrophy Ext: *** no edema Skin: warm and dry, no rash Neuro:  No gross deficits appreciated Psych: euthymic mood, full affect  *** ICD site is stable, no tethering or discomfort   EKG:  Done today and reviewed by myself shows  ***  Device interrogation done today and reviewed by myself:  ***   01/06/19: TTE 1. Left ventricular ejection fraction, by visual estimation, is 35 to  40%. The left ventricle has moderately decreased function. There is  moderately increased left ventricular hypertrophy.   2. Left ventricular diastolic parameters are consistent with Grade III  diastolic dysfunction (restrictive).   3. The left ventricle has no regional wall motion abnormalities.   4. Global right ventricle has moderately reduced systolic function.The  right ventricular size is moderately enlarged. No increase in right  ventricular wall thickness.   5. Left atrial size was normal.   6. Right atrial size was normal.   7. The mitral valve is normal in structure. Mild mitral valve  regurgitation.   8. The tricuspid valve is grossly normal.   9. The aortic valve is normal in structure. Aortic valve regurgitation is  not visualized. No evidence of aortic valve sclerosis or stenosis.  10. The pulmonic valve was grossly normal. Pulmonic valve regurgitation is  not visualized.  11. Moderately elevated pulmonary artery systolic pressure.  12. The atrial septum is grossly normal.    01/06/19: LHC Conclusions: Severe native coronary artery disease, as detailed below, not significant changed from prior catheterization in 2017. Widely patent LIMA-LAD. Patent but small and  diffusely diseased SVG-rPDA-rPL, with a focal 90% stenosis just before the SVG-rPDA anastomosis.  Graft appearance is similar to prior catheterization in 2017. Chronically occluded SVG-ramus-D1. Mildly elevated left ventricular filling pressure.   Recommendations: Given stable appearance of coronary artery disease and negligible troponin, I do not believe that worsening CAD is driving his dizziness/syncope.  Further evaluation for potential arrhythmic cause is recommended. Continue medical therapy and aggressive secondary prevention. Restart apixaban tomorrow if no evidence of bleeding/vascular complications.  Recent Labs: 05/04/2021:  ALT 42 11/30/2021: BUN 25; Creatinine, Ser 1.29; Hemoglobin 17.4; Platelets 259.0; Potassium 4.6; Sodium 127  05/04/2021: Chol/HDL Ratio 4.0; Cholesterol, Total 176; HDL 44; LDL Chol Calc (NIH) 125; Triglycerides 35   CrCl cannot be calculated (Patient's most recent lab result is older than the maximum 21 days allowed.).   Wt Readings from Last 3 Encounters:  01/11/22 196 lb (88.9 kg)  12/22/21 196 lb (88.9 kg)  11/30/21 192 lb (87.1 kg)     Other studies reviewed: Additional studies/records reviewed today include: summarized above  ASSESSMENT AND PLAN:  ICD ***  Persistent AFib CHA2DS2Vasc is 6, on eliquis, *** appropriately dosed *** % burden ***  CAD  *** C/w Dr. Oval Linsey  ICM Chronic CHF (systolic) *** *** % RV pacing *** optiVol Update his echo  Disposition: F/u with ***  Current medicines are reviewed at length with the patient today.  The patient did not have any concerns regarding medicines.  Venetia Night, PA-C 01/29/2022 10:37 AM     [facility] [place] [address] [phone] (office)  [phone] (fax)

## 2022-01-29 NOTE — H&P (View-Only) (Signed)
Cardiology Office Note Date:  01/29/2022  Patient ID:  [name] [birth year], MRN [id number] PCP:  [name], NP  Cardiologist:  Dr. Irish Lack Electrophysiologist: Dr. Rayann Heman >>> pending Dr. Quentin Ore    Chief Complaint:  increased AFib burden  History of Present Illness: [name] is a 77 y.o. male with history of MI, CAD (PCI 2016, CABG 2016, NSTEMI 2017 > occl RCA graft medically managed, 2021 cath with some progressive/relatively stable CAD no intervention), HTN, HLD, DM, AFib (MAZE and LAA clip w/CABG in 2016), ICM w/ICD  He saw Dr. Rayann Heman Aug 2022, feeling well, felt to be euvolemic, low Afib burden, maintained on [place], otherwise meds deferred to Dr. Iran Sizer.  He saw Dr. Irish Lack 04/27/21, feeling well, lipids meds stopped historically 2/2 myalgias, planned for lipid clinic referral, no changes otherwise   At his last device remote, noted AFib burden 95.9% Dr. Quentin Ore recommended he come in and review AF management strategies.  TODAY He reports feeling well. He was surprised to hear he was having AFib because he felt so well. He denies any CP, palpitations or cardiac awareness of any kind No SOB, DOE No dizzy spells, near syncope or syncope.  He reports good medication compliance, no missed doses of any medicines, particularly asked about his Eliquis, he is certain no missed doses in 3 weeks, even longer if ever has  Device information MDT single chamber ICd implanted 01/07/19   Past Medical History:  Diagnosis Date   Arthritis    "fingers, shoulders" (08/03/2015)   Atrial fibrillation (HCC)    Paroxysmal, rare episodes, sinus rhythm on flecainide, patient prefers not to take Coumadin   Bradycardia    April, 2013   Chest pain    Nuclear, 2006, no ischemia   Chronic lower back pain    "all my life"   Coronary artery disease    s/p staged cath 05/12/2014 and 5/11, DES x 2 to heavily calcified RCA, residual with 60% prox LAD, 70% D1   Ejection  fraction    EF 55-60%,  echo, January, 2011   Elevated CPK    CPK elevated with normal MB and normal troponin the past   Heart murmur    History of echocardiogram    Echo 8/16: EF 55%, normal wall motion, grade 2 diastolic dysfunction, trivial MR, normal RV function, PASP 27 mmHg   Hypertension    IBS (irritable bowel syndrome)    Myocardial infarction ([place]) 06/2014   Sinus drainage    Type II diabetes mellitus ([place])     Past Surgical History:  Procedure Laterality Date   APPENDECTOMY     CARDIAC CATHETERIZATION N/A 05/12/2014   Procedure: Left Heart Cath and Coronary Angiography;  Surgeon: Troy Sine, MD;  Location: [place] [facility];  Service: Cardiovascular;  Laterality: N/A;   CARDIAC CATHETERIZATION N/A 05/13/2014   Procedure: Coronary/Graft Atherectomy;  Surgeon: Troy Sine, MD;  Location: [place] [facility];  Service: Cardiovascular;  Laterality: N/A;   CARDIAC CATHETERIZATION Right 05/13/2014   Procedure: Temporary Pacemaker;  Surgeon: Troy Sine, MD;  Location: [place] [facility];  Service: Cardiovascular;  Laterality: Right;   CARDIAC CATHETERIZATION N/A 05/13/2014   Procedure: Coronary Stent Intervention;  Surgeon: Troy Sine, MD;  Location: [place] [facility];  Service: Cardiovascular;  Laterality: N/A;   CARDIAC CATHETERIZATION N/A 06/18/2014   Procedure: Left Heart Cath and Coronary Angiography;  Surgeon: Peter M Martinique, MD;  Location: [place]  [facility];  Service: Cardiovascular;  Laterality: N/A;   CARDIAC CATHETERIZATION N/A 08/03/2015   Procedure: Left Heart Cath and Cors/Grafts Angiography;  Surgeon: Nelva Bush, MD;  Location: [place] [facility];  Service: Cardiovascular;  Laterality: N/A;   CARDIOVASCULAR STRESS TEST  05/06/2014   CATARACT EXTRACTION W/ INTRAOCULAR LENS IMPLANT Left    COLONOSCOPY WITH PROPOFOL N/A 02/13/2020   Procedure: COLONOSCOPY WITH PROPOFOL;  Surgeon: Carol Ada, MD;  [facility]: [facility];  Service: Endoscopy;  Laterality:  N/A;   CORONARY ARTERY BYPASS GRAFT N/A 06/22/2014   Procedure: CORONARY ARTERY BYPASS GRAFTING (CABG) x five, using left internal mammary artery, and right leg greater saphenous vein harvested endoscopically;  Surgeon: Melrose Nakayama, MD;  Location: [place];  Service: Open Heart Surgery;  Laterality: N/A;   ICD IMPLANT N/A 01/07/2019   Procedure: ICD IMPLANT;  Surgeon: Thompson Grayer, MD;  Location: [place] [facility];  Service: Cardiovascular;  Laterality: N/A;   LAPAROSCOPIC CHOLECYSTECTOMY     LEFT HEART CATH AND CORS/GRAFTS ANGIOGRAPHY N/A 01/06/2019   Procedure: LEFT HEART CATH AND CORS/GRAFTS ANGIOGRAPHY;  Surgeon: Nelva Bush, MD;  Location: [place] [facility];  Service: Cardiovascular;  Laterality: N/A;   MAZE N/A 06/22/2014   Procedure: MAZE;  Surgeon: Melrose Nakayama, MD;  Location: [place];  Service: Open Heart Surgery;  Laterality: N/A;   POLYPECTOMY  02/13/2020   Procedure: POLYPECTOMY;  Surgeon: Carol Ada, MD;  [facility];  Service: Endoscopy;;   TEE WITHOUT CARDIOVERSION N/A 06/22/2014   Procedure: TRANSESOPHAGEAL ECHOCARDIOGRAM (TEE);  Surgeon: Melrose Nakayama, MD;  Location: [place];  Service: Open Heart Surgery;  Laterality: N/A;    Current Outpatient Medications  Medication Sig Dispense Refill   albuterol (VENTOLIN HFA) 108 (90 Base) MCG/ACT inhaler Inhale 2 puffs into the lungs every 6 (six) hours as needed for wheezing or shortness of breath. 8 g 3   carvedilol (COREG) 6.25 MG tablet TAKE 1 TABLET BY MOUTH TWICE A DAY WITH MEAL 180 tablet 1   Continuous Blood Gluc Receiver (FREESTYLE LIBRE 2 READER) DEVI Use to check blood sugars. 1 each 0   Continuous Blood Gluc Sensor (FREESTYLE LIBRE 3 SENSOR) MISC Place 1 sensor on the skin every 14 days. Use to check glucose continuously 6 each 1   dapagliflozin propanediol (FARXIGA) 10 MG TABS tablet Take 1 tablet (10 mg total) by mouth daily before breakfast. For diabetes. 90 tablet 1   ELIQUIS 5 MG TABS  tablet TAKE 1 TABLET BY MOUTH TWICE A DAY 60 tablet 5   insulin degludec (TRESIBA FLEXTOUCH) 200 UNIT/ML FlexTouch Pen Inject 100 Units into the skin daily. Via Novo Cares PAP 36 mL 0   Insulin Pen Needle 31G X 8 MM MISC Use as directed with Lantus. 100 each 5   isosorbide mononitrate (IMDUR) 30 MG 24 hr tablet TAKE 1 TABLET BY MOUTH EVERY DAY 90 tablet 2   lisinopril (ZESTRIL) 5 MG tablet TAKE 1 TABLET (5 MG TOTAL) BY MOUTH DAILY. PLEASE KEEP UPCOMING APPT FOR FUTURE REFILLS. 90 tablet 1   nitroGLYCERIN (NITROSTAT) 0.4 MG SL tablet Place 1 tablet (0.4 mg total) under the tongue every 5 (five) minutes as needed for chest pain. 25 tablet 6   ondansetron (ZOFRAN-ODT) 4 MG disintegrating tablet Take 1 tablet (4 mg total) by mouth every 8 (eight) hours as needed for nausea or vomiting. 15 tablet 0   OVER THE COUNTER MEDICATION Place 1 drop into both eyes 2 (two) times daily. liquid  msm eye drops/ for floaters in the eyes     Semaglutide, 1 MG/DOSE, (OZEMPIC, 1 MG/DOSE,) 4 MG/3ML SOPN Inject 1 mg into the skin once a week. Via Novo Cares PAP     tamsulosin (FLOMAX) 0.4 MG CAPS capsule TAKE 1 CAPSULE (0.4 MG TOTAL) BY MOUTH DAILY. FOR URINE FLOW. 90 capsule 0   tiZANidine (ZANAFLEX) 4 MG tablet Take 0.5-1 tablets (2-4 mg total) by mouth at bedtime as needed for muscle spasms. 30 tablet 2   No current facility-administered medications for this visit.    Allergies:   Lipitor [atorvastatin], Pravastatin, and Simvastatin   Social History:  The patient  reports that he has never smoked. He has never used smokeless tobacco. He reports that he does not drink alcohol and does not use drugs.   Family History:  The patient's family history includes Alzheimer's disease in his mother; Arrhythmia in his sister; Cancer in his brother; Emphysema in his father; Heart attack in his sister; Heart disease in his sister; Hyperlipidemia in his sister; Hypertension in his sister.  ROS:  Please see the history of present  illness.    All other systems are reviewed and otherwise negative.   PHYSICAL EXAM:  VS:  There were no vitals taken for this visit. BMI: There is no height or weight on file to calculate BMI. Well nourished, well developed, in no acute distress HEENT: normocephalic, atraumatic Neck: no JVD, carotid bruits or masses Cardiac:  irreg-irreg; no significant murmurs, no rubs, or gallops Lungs:  CTA b/l, no wheezing, rhonchi or rales Abd: soft, nontender MS: no deformity or atrophy Ext: no edema Skin: warm and dry, no rash Neuro:  No gross deficits appreciated Psych: euthymic mood, full affect  ICD site is stable, no tethering or discomfort   EKG:  Done today and reviewed by myself shows  AFib 89bpm,   Device interrogation done today and reviewed by myself:  Battery and lead measurements are good AFib burden is by the device 49%, appears 100% by data OptiVol looks good His HRs for the most part are controlled, has good HR excursion   01/06/19: TTE 1. Left ventricular ejection fraction, by visual estimation, is 35 to  40%. The left ventricle has moderately decreased function. There is  moderately increased left ventricular hypertrophy.   2. Left ventricular diastolic parameters are consistent with Grade III  diastolic dysfunction (restrictive).   3. The left ventricle has no regional wall motion abnormalities.   4. Global right ventricle has moderately reduced systolic function.The  right ventricular size is moderately enlarged. No increase in right  ventricular wall thickness.   5. Left atrial size was normal.   6. Right atrial size was normal.   7. The mitral valve is normal in structure. Mild mitral valve  regurgitation.   8. The tricuspid valve is grossly normal.   9. The aortic valve is normal in structure. Aortic valve regurgitation is  not visualized. No evidence of aortic valve sclerosis or stenosis.  10. The pulmonic valve was grossly normal. Pulmonic valve  regurgitation is  not visualized.  11. Moderately elevated pulmonary artery systolic pressure.  12. The atrial septum is grossly normal.    01/06/19: [facility] Conclusions: Severe native coronary artery disease, as detailed below, not significant changed from prior catheterization in 2017. Widely patent LIMA-LAD. Patent but small and diffusely diseased SVG-rPDA-rPL, with a focal 90% stenosis just before the SVG-rPDA anastomosis.  Graft appearance is similar to prior catheterization in 2017. Chronically occluded SVG-ramus-D1.  Mildly elevated left ventricular filling pressure.   Recommendations: Given stable appearance of coronary artery disease and negligible troponin, I do not believe that worsening CAD is driving his dizziness/syncope.  Further evaluation for potential arrhythmic cause is recommended. Continue medical therapy and aggressive secondary prevention. Restart apixaban tomorrow if no evidence of bleeding/vascular complications.  Recent Labs: 05/04/2021: ALT 42 11/30/2021: BUN 25; Creatinine, Ser 1.29; Hemoglobin 17.4; Platelets 259.0; Potassium 4.6; Sodium 127  05/04/2021: Chol/HDL Ratio 4.0; Cholesterol, Total 176; HDL 44; LDL Chol Calc (NIH) 125; Triglycerides 35   CrCl cannot be calculated (Patient's most recent lab result is older than the maximum 21 days allowed.).   Wt Readings from Last 3 Encounters:  01/11/22 196 lb (88.9 kg)  12/22/21 196 lb (88.9 kg)  11/30/21 192 lb (87.1 kg)     Other studies reviewed: Additional studies/records reviewed today include: summarized above  ASSESSMENT AND PLAN:  ICD Intact function No programming changes made  Persistent AFib CHA2DS2Vasc is 6, on eliquis, appropriately dosed Appears at/near 100 % burden since June  amiodaone and Tikosyn are options Will check an echo to assess structure/function, help guide rhythm management strategies, +/- ablation candidacy  Zofran is on his med list though he is not actively taking  it.  Will start with DCCV, discussed importance of eliquis compliance   CAD  No anginal symptoms C/w Dr. Oval Linsey  ICM Chronic CHF (systolic) No symptoms or exam findings of volume OL zero % RV pacing optiVol looks good Update his echo    Disposition: F/u with Dr. Quentin Ore to establish care with him and further discuss/plan rhythm/rate control decisions post DCCV with an updated echo.  Current medicines are reviewed at length with the patient today.  The patient did not have any concerns regarding medicines.  Venetia Night, PA-C 01/29/2022 10:37 AM     [facility] [place] [address] [phone] (office)  [phone] (fax)

## 2022-01-29 NOTE — H&P (View-Only) (Signed)
Cardiology Office Note Date:  01/29/2022  Patient ID:  [name] 1945/07/11, MRN [id number] PCP:  [name], NP  Cardiologist:  Dr. Irish Lack Electrophysiologist: Dr. Rayann Heman >>> pending Dr. Quentin Ore    Chief Complaint:  increased AFib burden  History of Present Illness: [name] is a 77 y.o. male with history of MI, CAD (PCI 2016, CABG 2016, NSTEMI 2017 > occl RCA graft medically managed, 2021 cath with some progressive/relatively stable CAD no intervention), HTN, HLD, DM, AFib (MAZE and LAA clip w/CABG in 2016), ICM w/ICD  He saw Dr. Rayann Heman Aug 2022, feeling well, felt to be euvolemic, low Afib burden, maintained on [place], otherwise meds deferred to Dr. Iran Sizer.  He saw Dr. Irish Lack 04/27/21, feeling well, lipids meds stopped historically 2/2 myalgias, planned for lipid clinic referral, no changes otherwise   At his last device remote, noted AFib burden 95.9% Dr. Quentin Ore recommended he come in and review AF management strategies.  TODAY He reports feeling well. He was surprised to hear he was having AFib because he felt so well. He denies any CP, palpitations or cardiac awareness of any kind No SOB, DOE No dizzy spells, near syncope or syncope.  He reports good medication compliance, no missed doses of any medicines, particularly asked about his Eliquis, he is certain no missed doses in 3 weeks, even longer if ever has  Device information MDT single chamber ICd implanted 01/07/19   Past Medical History:  Diagnosis Date   Arthritis    "fingers, shoulders" (08/03/2015)   Atrial fibrillation (HCC)    Paroxysmal, rare episodes, sinus rhythm on flecainide, patient prefers not to take Coumadin   Bradycardia    April, 2013   Chest pain    Nuclear, 2006, no ischemia   Chronic lower back pain    "all my life"   Coronary artery disease    s/p staged cath 05/12/2014 and 5/11, DES x 2 to heavily calcified RCA, residual with 60% prox LAD, 70% D1   Ejection  fraction    EF 55-60%,  echo, January, 2011   Elevated CPK    CPK elevated with normal MB and normal troponin the past   Heart murmur    History of echocardiogram    Echo 8/16: EF 55%, normal wall motion, grade 2 diastolic dysfunction, trivial MR, normal RV function, PASP 27 mmHg   Hypertension    IBS (irritable bowel syndrome)    Myocardial infarction ([place]) 06/2014   Sinus drainage    Type II diabetes mellitus ([place])     Past Surgical History:  Procedure Laterality Date   APPENDECTOMY     CARDIAC CATHETERIZATION N/A 05/12/2014   Procedure: Left Heart Cath and Coronary Angiography;  Surgeon: Troy Sine, MD;  Location: [place] [facility];  Service: Cardiovascular;  Laterality: N/A;   CARDIAC CATHETERIZATION N/A 05/13/2014   Procedure: Coronary/Graft Atherectomy;  Surgeon: Troy Sine, MD;  Location: [place];  Service: Cardiovascular;  Laterality: N/A;   CARDIAC CATHETERIZATION Right 05/13/2014   Procedure: Temporary Pacemaker;  Surgeon: Troy Sine, MD;  Location: [facility];  Service: Cardiovascular;  Laterality: Right;   CARDIAC CATHETERIZATION N/A 05/13/2014   Procedure: Coronary Stent Intervention;  Surgeon: Troy Sine, MD;  Location: [facility];  Service: Cardiovascular;  Laterality: N/A;   CARDIAC CATHETERIZATION N/A 06/18/2014   Procedure: Left Heart Cath and Coronary Angiography;  Surgeon: Peter M Martinique, MD;  Location: [place]  [facility];  Service: Cardiovascular;  Laterality: N/A;   CARDIAC CATHETERIZATION N/A 08/03/2015   Procedure: Left Heart Cath and Cors/Grafts Angiography;  Surgeon: Nelva Bush, MD;  Location: [place] [facility];  Service: Cardiovascular;  Laterality: N/A;   CARDIOVASCULAR STRESS TEST  05/06/2014   CATARACT EXTRACTION W/ INTRAOCULAR LENS IMPLANT Left    COLONOSCOPY WITH PROPOFOL N/A 02/13/2020   Procedure: COLONOSCOPY WITH PROPOFOL;  Surgeon: Carol Ada, MD;  Location: [facility];  Service: Endoscopy;  Laterality:  N/A;   CORONARY ARTERY BYPASS GRAFT N/A 06/22/2014   Procedure: CORONARY ARTERY BYPASS GRAFTING (CABG) x five, using left internal mammary artery, and right leg greater saphenous vein harvested endoscopically;  Surgeon: Melrose Nakayama, MD;  Location: [place];  Service: Open Heart Surgery;  Laterality: N/A;   ICD IMPLANT N/A 01/07/2019   Procedure: ICD IMPLANT;  Surgeon: Thompson Grayer, MD;  Location: [place] [facility];  Service: Cardiovascular;  Laterality: N/A;   LAPAROSCOPIC CHOLECYSTECTOMY     LEFT HEART CATH AND CORS/GRAFTS ANGIOGRAPHY N/A 01/06/2019   Procedure: LEFT HEART CATH AND CORS/GRAFTS ANGIOGRAPHY;  Surgeon: Nelva Bush, MD;  Location: [facility];  Service: Cardiovascular;  Laterality: N/A;   MAZE N/A 06/22/2014   Procedure: MAZE;  Surgeon: Melrose Nakayama, MD;  Location: [facility];  Service: Open Heart Surgery;  Laterality: N/A;   POLYPECTOMY  02/13/2020   Procedure: POLYPECTOMY;  Surgeon: Carol Ada, MD;  Location: [facility];  Service: Endoscopy;;   TEE WITHOUT CARDIOVERSION N/A 06/22/2014   Procedure: TRANSESOPHAGEAL ECHOCARDIOGRAM (TEE);  Surgeon: Melrose Nakayama, MD;  Location: [facility];  Service: Open Heart Surgery;  Laterality: N/A;    Current Outpatient Medications  Medication Sig Dispense Refill   albuterol (VENTOLIN HFA) 108 (90 Base) MCG/ACT inhaler Inhale 2 puffs into the lungs every 6 (six) hours as needed for wheezing or shortness of breath. 8 g 3   carvedilol (COREG) 6.25 MG tablet TAKE 1 TABLET BY MOUTH TWICE A DAY WITH MEAL 180 tablet 1   Continuous Blood Gluc Receiver (FREESTYLE LIBRE 2 READER) DEVI Use to check blood sugars. 1 each 0   Continuous Blood Gluc Sensor (FREESTYLE LIBRE 3 SENSOR) MISC Place 1 sensor on the skin every 14 days. Use to check glucose continuously 6 each 1   dapagliflozin propanediol (FARXIGA) 10 MG TABS tablet Take 1 tablet (10 mg total) by mouth daily before breakfast. For diabetes. 90 tablet 1   ELIQUIS 5 MG TABS  tablet TAKE 1 TABLET BY MOUTH TWICE A DAY 60 tablet 5   insulin degludec (TRESIBA FLEXTOUCH) 200 UNIT/ML FlexTouch Pen Inject 100 Units into the skin daily. Via Novo Cares PAP 36 mL 0   Insulin Pen Needle 31G X 8 MM MISC Use as directed with Lantus. 100 each 5   isosorbide mononitrate (IMDUR) 30 MG 24 hr tablet TAKE 1 TABLET BY MOUTH EVERY DAY 90 tablet 2   lisinopril (ZESTRIL) 5 MG tablet TAKE 1 TABLET (5 MG TOTAL) BY MOUTH DAILY. PLEASE KEEP UPCOMING APPT FOR FUTURE REFILLS. 90 tablet 1   nitroGLYCERIN (NITROSTAT) 0.4 MG SL tablet Place 1 tablet (0.4 mg total) under the tongue every 5 (five) minutes as needed for chest pain. 25 tablet 6   ondansetron (ZOFRAN-ODT) 4 MG disintegrating tablet Take 1 tablet (4 mg total) by mouth every 8 (eight) hours as needed for nausea or vomiting. 15 tablet 0   OVER THE COUNTER MEDICATION Place 1 drop into both eyes 2 (two) times daily. liquid  msm eye drops/ for floaters in the eyes     Semaglutide, 1 MG/DOSE, (OZEMPIC, 1 MG/DOSE,) 4 MG/3ML SOPN Inject 1 mg into the skin once a week. Via Novo Cares PAP     tamsulosin (FLOMAX) 0.4 MG CAPS capsule TAKE 1 CAPSULE (0.4 MG TOTAL) BY MOUTH DAILY. FOR URINE FLOW. 90 capsule 0   tiZANidine (ZANAFLEX) 4 MG tablet Take 0.5-1 tablets (2-4 mg total) by mouth at bedtime as needed for muscle spasms. 30 tablet 2   No current facility-administered medications for this visit.    Allergies:   Lipitor [atorvastatin], Pravastatin, and Simvastatin   Social History:  The patient  reports that he has never smoked. He has never used smokeless tobacco. He reports that he does not drink alcohol and does not use drugs.   Family History:  The patient's family history includes Alzheimer's disease in his mother; Arrhythmia in his sister; Cancer in his brother; Emphysema in his father; Heart attack in his sister; Heart disease in his sister; Hyperlipidemia in his sister; Hypertension in his sister.  ROS:  Please see the history of present  illness.    All other systems are reviewed and otherwise negative.   PHYSICAL EXAM:  VS:  There were no vitals taken for this visit. BMI: There is no height or weight on file to calculate BMI. Well nourished, well developed, in no acute distress HEENT: normocephalic, atraumatic Neck: no JVD, carotid bruits or masses Cardiac:  irreg-irreg; no significant murmurs, no rubs, or gallops Lungs:  CTA b/l, no wheezing, rhonchi or rales Abd: soft, nontender MS: no deformity or atrophy Ext: no edema Skin: warm and dry, no rash Neuro:  No gross deficits appreciated Psych: euthymic mood, full affect  ICD site is stable, no tethering or discomfort   EKG:  Done today and reviewed by myself shows  AFib 89bpm,   Device interrogation done today and reviewed by myself:  Battery and lead measurements are good AFib burden is by the device 49%, appears 100% by data OptiVol looks good His HRs for the most part are controlled, has good HR excursion   01/06/19: TTE 1. Left ventricular ejection fraction, by visual estimation, is 35 to  40%. The left ventricle has moderately decreased function. There is  moderately increased left ventricular hypertrophy.   2. Left ventricular diastolic parameters are consistent with Grade III  diastolic dysfunction (restrictive).   3. The left ventricle has no regional wall motion abnormalities.   4. Global right ventricle has moderately reduced systolic function.The  right ventricular size is moderately enlarged. No increase in right  ventricular wall thickness.   5. Left atrial size was normal.   6. Right atrial size was normal.   7. The mitral valve is normal in structure. Mild mitral valve  regurgitation.   8. The tricuspid valve is grossly normal.   9. The aortic valve is normal in structure. Aortic valve regurgitation is  not visualized. No evidence of aortic valve sclerosis or stenosis.  10. The pulmonic valve was grossly normal. Pulmonic valve  regurgitation is  not visualized.  11. Moderately elevated pulmonary artery systolic pressure.  12. The atrial septum is grossly normal.    01/06/19: [facility] Conclusions: Severe native coronary artery disease, as detailed below, not significant changed from prior catheterization in 2017. Widely patent LIMA-LAD. Patent but small and diffusely diseased SVG-rPDA-rPL, with a focal 90% stenosis just before the SVG-rPDA anastomosis.  Graft appearance is similar to prior catheterization in 2017. Chronically occluded SVG-ramus-D1.  Mildly elevated left ventricular filling pressure.   Recommendations: Given stable appearance of coronary artery disease and negligible troponin, I do not believe that worsening CAD is driving his dizziness/syncope.  Further evaluation for potential arrhythmic cause is recommended. Continue medical therapy and aggressive secondary prevention. Restart apixaban tomorrow if no evidence of bleeding/vascular complications.  Recent Labs: 05/04/2021: ALT 42 11/30/2021: BUN 25; Creatinine, Ser 1.29; Hemoglobin 17.4; Platelets 259.0; Potassium 4.6; Sodium 127  05/04/2021: Chol/HDL Ratio 4.0; Cholesterol, Total 176; HDL 44; LDL Chol Calc (NIH) 125; Triglycerides 35   CrCl cannot be calculated (Patient's most recent lab result is older than the maximum 21 days allowed.).   Wt Readings from Last 3 Encounters:  01/11/22 196 lb (88.9 kg)  12/22/21 196 lb (88.9 kg)  11/30/21 192 lb (87.1 kg)     Other studies reviewed: Additional studies/records reviewed today include: summarized above  ASSESSMENT AND PLAN:  ICD Intact function No programming changes made  Persistent AFib CHA2DS2Vasc is 6, on eliquis, appropriately dosed Appears at/near 100 % burden since June  amiodaone and Tikosyn are options Will check an echo to assess structure/function, help guide rhythm management strategies, +/- ablation candidacy  Zofran is on his med list though he is not actively taking  it.  Will start with DCCV, discussed importance of eliquis compliance   CAD  No anginal symptoms C/w Dr. Oval Linsey  ICM Chronic CHF (systolic) No symptoms or exam findings of volume OL zero % RV pacing optiVol looks good Update his echo    Disposition: F/u with Dr. Quentin Ore to establish care with him and further discuss/plan rhythm/rate control decisions post DCCV with an updated echo.  Current medicines are reviewed at length with the patient today.  The patient did not have any concerns regarding medicines.  Venetia Night, PA-C 01/29/2022 10:37 AM     [facility] [place] [address] [phone] (office)  [phone] (fax)

## 2022-01-30 NOTE — Progress Notes (Signed)
Remote ICD transmission.   

## 2022-01-31 ENCOUNTER — Encounter: Payer: Self-pay | Admitting: Physician Assistant

## 2022-01-31 ENCOUNTER — Encounter: Payer: Self-pay | Admitting: *Deleted

## 2022-01-31 ENCOUNTER — Ambulatory Visit: Payer: Medicare Other | Attending: Physician Assistant | Admitting: Physician Assistant

## 2022-01-31 VITALS — BP 134/82 | HR 89 | Ht 68.0 in | Wt 194.0 lb

## 2022-01-31 DIAGNOSIS — I4819 Other persistent atrial fibrillation: Secondary | ICD-10-CM | POA: Diagnosis not present

## 2022-01-31 DIAGNOSIS — I4891 Unspecified atrial fibrillation: Secondary | ICD-10-CM | POA: Diagnosis not present

## 2022-01-31 DIAGNOSIS — I5022 Chronic systolic (congestive) heart failure: Secondary | ICD-10-CM

## 2022-01-31 DIAGNOSIS — Z95810 Presence of automatic (implantable) cardiac defibrillator: Secondary | ICD-10-CM | POA: Diagnosis not present

## 2022-01-31 DIAGNOSIS — I255 Ischemic cardiomyopathy: Secondary | ICD-10-CM | POA: Diagnosis not present

## 2022-01-31 DIAGNOSIS — I2510 Atherosclerotic heart disease of native coronary artery without angina pectoris: Secondary | ICD-10-CM | POA: Diagnosis not present

## 2022-01-31 LAB — CUP PACEART INCLINIC DEVICE CHECK
Battery Remaining Longevity: 114 mo
Battery Voltage: 3.02 V
Brady Statistic RV Percent Paced: 0.01 %
Date Time Interrogation Session: 20240130133138
HighPow Impedance: 73 Ohm
Implantable Lead Connection Status: 753985
Implantable Lead Implant Date: 20210105
Implantable Lead Location: 753860
Implantable Pulse Generator Implant Date: 20210105
Lead Channel Impedance Value: 399 Ohm
Lead Channel Impedance Value: 494 Ohm
Lead Channel Pacing Threshold Amplitude: 0.5 V
Lead Channel Pacing Threshold Pulse Width: 0.4 ms
Lead Channel Sensing Intrinsic Amplitude: 9.5 mV
Lead Channel Sensing Intrinsic Amplitude: 9.75 mV
Lead Channel Setting Pacing Amplitude: 2.5 V
Lead Channel Setting Pacing Pulse Width: 0.4 ms
Lead Channel Setting Sensing Sensitivity: 0.3 mV
Zone Setting Status: 755011
Zone Setting Status: 755011

## 2022-01-31 NOTE — Patient Instructions (Addendum)
Medication Instructions:   Your physician recommends that you continue on your current medications as directed. Please refer to the Current Medication list given to you today.   *If you need a refill on your cardiac medications before your next appointment, please call your pharmacy*   Lab Work:  BMET AND CBC TODAY   If you have labs (blood work) drawn today and your tests are completely normal, you will receive your results only by: [place] (if you have MyChart) OR A paper copy in the mail If you have any lab test that is abnormal or we need to change your treatment, we will call you to review the results.   Testing/Procedures: Your physician has requested that you have an echocardiogram. Echocardiography is a painless test that uses sound waves to create images of your heart. It provides your doctor with information about the size and shape of your heart and how well your heart's chambers and valves are working. This procedure takes approximately one hour. There are no restrictions for this procedure. Please do NOT wear cologne, perfume, aftershave, or lotions (deodorant is allowed). Please arrive 15 minutes prior to your appointment time.   ON 02-15-22 Your physician has recommended that you have a Cardioversion (DCCV). Electrical Cardioversion uses a jolt of electricity to your heart either through paddles or wired patches attached to your chest. This is a controlled, usually prescheduled, procedure. Defibrillation is done under light anesthesia in the hospital, and you usually go home the day of the procedure. This is done to get your heart back into a normal rhythm. You are not awake for the procedure. Please see the instruction sheet given to you today.    Follow-Up: At [facility], you and your health needs are our priority.  As part of our continuing mission to provide you with exceptional heart care, we have created designated Provider Care Teams.  These Care  Teams include your primary Cardiologist (physician) and Advanced Practice Providers (APPs -  Physician Assistants and Nurse Practitioners) who all work together to provide you with the care you need, when you need it.  We recommend signing up for the patient portal called "MyChart".  Sign up information is provided on this After Visit Summary.  MyChart is used to connect with patients for Virtual Visits (Telemedicine).  Patients are able to view lab/test results, encounter notes, upcoming appointments, etc.  Non-urgent messages can be sent to your provider as well.   To learn more about what you can do with MyChart, go to [email or web address].    Your next appointment POST CARDIOVERSION  AFTER 02-15-22  6 -8  week(s)  Provider:   You may see Dr. Quentin Ore    Other Instructions

## 2022-02-01 LAB — BASIC METABOLIC PANEL
BUN/Creatinine Ratio: 13 (ref 10–24)
BUN: 15 mg/dL (ref 8–27)
CO2: 20 mmol/L (ref 20–29)
Calcium: 9.3 mg/dL (ref 8.6–10.2)
Chloride: 99 mmol/L (ref 96–106)
Creatinine, Ser: 1.13 mg/dL (ref 0.76–1.27)
Glucose: 324 mg/dL — ABNORMAL HIGH (ref 70–99)
Potassium: 4.3 mmol/L (ref 3.5–5.2)
Sodium: 135 mmol/L (ref 134–144)
eGFR: 67 mL/min/{1.73_m2} (ref >59)

## 2022-02-01 LAB — CBC
Hematocrit: 46.4 % (ref 37.5–51.0)
Hemoglobin: 15.6 g/dL (ref 13.0–17.7)
MCH: 31 pg (ref 26.6–33.0)
MCHC: 33.6 g/dL (ref 31.5–35.7)
MCV: 92 fL (ref 79–97)
Platelets: 242 10*3/uL (ref 150–450)
RBC: 5.04 x10E6/uL (ref 4.14–5.80)
RDW: 13.5 % (ref 11.6–15.4)
WBC: 7.6 10*3/uL (ref 3.4–10.8)

## 2022-02-13 ENCOUNTER — Ambulatory Visit (HOSPITAL_COMMUNITY): Payer: Medicare Other | Attending: Physician Assistant

## 2022-02-13 DIAGNOSIS — I4819 Other persistent atrial fibrillation: Secondary | ICD-10-CM | POA: Diagnosis not present

## 2022-02-13 LAB — ECHOCARDIOGRAM COMPLETE
Area-P 1/2: 4.23 cm2
MV M vel: 3.87 m/s
MV Peak grad: 59.8 mmHg
S' Lateral: 4.1 cm

## 2022-02-14 ENCOUNTER — Other Ambulatory Visit: Payer: Self-pay | Admitting: *Deleted

## 2022-02-14 DIAGNOSIS — Z79899 Other long term (current) drug therapy: Secondary | ICD-10-CM

## 2022-02-14 MED ORDER — ENTRESTO 24-26 MG PO TABS: 1.0000 | ORAL_TABLET | Freq: Two times a day (BID) | ORAL | 2 refills | Status: DC

## 2022-02-15 ENCOUNTER — Encounter (HOSPITAL_COMMUNITY)
Admission: RE | Disposition: A | Discharge status: Home / Self Care | Payer: Self-pay | Source: Home / Self Care | Attending: Cardiology

## 2022-02-15 ENCOUNTER — Ambulatory Visit (HOSPITAL_COMMUNITY): Payer: Medicare Other | Admitting: Certified Registered"

## 2022-02-15 ENCOUNTER — Other Ambulatory Visit: Payer: Self-pay

## 2022-02-15 ENCOUNTER — Ambulatory Visit (HOSPITAL_COMMUNITY)
Admission: RE | Admit: 2022-02-15 | Discharge: 2022-02-15 | Disposition: A | Discharge status: Home / Self Care | Payer: Medicare Other | Attending: Cardiology | Admitting: Cardiology

## 2022-02-15 ENCOUNTER — Encounter (HOSPITAL_COMMUNITY): Payer: Self-pay | Admitting: Cardiology

## 2022-02-15 DIAGNOSIS — Z794 Long term (current) use of insulin: Secondary | ICD-10-CM | POA: Diagnosis not present

## 2022-02-15 DIAGNOSIS — Z7984 Long term (current) use of oral hypoglycemic drugs: Secondary | ICD-10-CM | POA: Insufficient documentation

## 2022-02-15 DIAGNOSIS — Z95810 Presence of automatic (implantable) cardiac defibrillator: Secondary | ICD-10-CM | POA: Insufficient documentation

## 2022-02-15 DIAGNOSIS — I2510 Atherosclerotic heart disease of native coronary artery without angina pectoris: Secondary | ICD-10-CM | POA: Diagnosis not present

## 2022-02-15 DIAGNOSIS — I10 Essential (primary) hypertension: Secondary | ICD-10-CM | POA: Insufficient documentation

## 2022-02-15 DIAGNOSIS — Z539 Procedure and treatment not carried out, unspecified reason: Secondary | ICD-10-CM | POA: Diagnosis present

## 2022-02-15 DIAGNOSIS — I480 Paroxysmal atrial fibrillation: Secondary | ICD-10-CM

## 2022-02-15 DIAGNOSIS — I252 Old myocardial infarction: Secondary | ICD-10-CM | POA: Diagnosis not present

## 2022-02-15 DIAGNOSIS — E119 Type 2 diabetes mellitus without complications: Secondary | ICD-10-CM | POA: Insufficient documentation

## 2022-02-15 DIAGNOSIS — Z955 Presence of coronary angioplasty implant and graft: Secondary | ICD-10-CM | POA: Diagnosis not present

## 2022-02-15 DIAGNOSIS — I4819 Other persistent atrial fibrillation: Secondary | ICD-10-CM | POA: Diagnosis present

## 2022-02-15 SURGERY — CANCELLED PROCEDURE

## 2022-02-15 NOTE — Interval H&P Note (Signed)
History and Physical Interval Note:  02/15/2022 8:36 AM  [name]  has presented today for surgery, with the diagnosis of AFIB.  The various methods of treatment have been discussed with the patient and family. After consideration of risks, benefits and other options for treatment, the patient has consented to  Procedure(s): CARDIOVERSION (N/A) as a surgical intervention.  The patient's history has been reviewed, patient examined, no change in status, stable for surgery.  I have reviewed the patient's chart and labs.  Questions were answered to the patient's satisfaction.     Rachyl Wuebker Harrell Gave

## 2022-02-15 NOTE — Interval H&P Note (Signed)
Case was cancelled per anesthesia policy, as patient has taken [place] within the last week.

## 2022-02-15 NOTE — Interval H&P Note (Signed)
History and Physical Interval Note:  02/15/2022 8:36 AM  [name]  has presented today for surgery, with the diagnosis of AFIB.  The various methods of treatment have been discussed with the patient and family. After consideration of risks, benefits and other options for treatment, the patient has consented to  Procedure(s): CARDIOVERSION (N/A) as a surgical intervention.  The patient's history has been reviewed, patient examined, no change in status, stable for surgery.  I have reviewed the patient's chart and labs.  Questions were answered to the patient's satisfaction.     [name] Harrell Gave

## 2022-02-21 ENCOUNTER — Other Ambulatory Visit: Payer: Self-pay

## 2022-02-21 ENCOUNTER — Ambulatory Visit (HOSPITAL_BASED_OUTPATIENT_CLINIC_OR_DEPARTMENT_OTHER): Payer: Medicare Other | Admitting: Anesthesiology

## 2022-02-21 ENCOUNTER — Ambulatory Visit (HOSPITAL_COMMUNITY)
Admission: RE | Admit: 2022-02-21 | Discharge: 2022-02-21 | Disposition: A | Discharge status: Home / Self Care | Payer: Medicare Other | Source: Ambulatory Visit | Attending: Cardiology | Admitting: Cardiology

## 2022-02-21 ENCOUNTER — Ambulatory Visit (HOSPITAL_COMMUNITY): Payer: Medicare Other | Admitting: Anesthesiology

## 2022-02-21 ENCOUNTER — Encounter (HOSPITAL_COMMUNITY)
Admission: RE | Disposition: A | Discharge status: Home / Self Care | Payer: Self-pay | Source: Ambulatory Visit | Attending: Cardiology

## 2022-02-21 ENCOUNTER — Encounter (HOSPITAL_COMMUNITY): Payer: Self-pay | Admitting: Cardiology

## 2022-02-21 DIAGNOSIS — Z7984 Long term (current) use of oral hypoglycemic drugs: Secondary | ICD-10-CM | POA: Insufficient documentation

## 2022-02-21 DIAGNOSIS — I509 Heart failure, unspecified: Secondary | ICD-10-CM

## 2022-02-21 DIAGNOSIS — I252 Old myocardial infarction: Secondary | ICD-10-CM | POA: Diagnosis not present

## 2022-02-21 DIAGNOSIS — I110 Hypertensive heart disease with heart failure: Secondary | ICD-10-CM | POA: Insufficient documentation

## 2022-02-21 DIAGNOSIS — I25810 Atherosclerosis of coronary artery bypass graft(s) without angina pectoris: Secondary | ICD-10-CM | POA: Insufficient documentation

## 2022-02-21 DIAGNOSIS — Z7901 Long term (current) use of anticoagulants: Secondary | ICD-10-CM | POA: Diagnosis not present

## 2022-02-21 DIAGNOSIS — E119 Type 2 diabetes mellitus without complications: Secondary | ICD-10-CM | POA: Diagnosis not present

## 2022-02-21 DIAGNOSIS — Z794 Long term (current) use of insulin: Secondary | ICD-10-CM | POA: Insufficient documentation

## 2022-02-21 DIAGNOSIS — I2510 Atherosclerotic heart disease of native coronary artery without angina pectoris: Secondary | ICD-10-CM

## 2022-02-21 DIAGNOSIS — I4891 Unspecified atrial fibrillation: Secondary | ICD-10-CM

## 2022-02-21 DIAGNOSIS — I4819 Other persistent atrial fibrillation: Secondary | ICD-10-CM | POA: Diagnosis not present

## 2022-02-21 DIAGNOSIS — Z95810 Presence of automatic (implantable) cardiac defibrillator: Secondary | ICD-10-CM | POA: Insufficient documentation

## 2022-02-21 DIAGNOSIS — I5022 Chronic systolic (congestive) heart failure: Secondary | ICD-10-CM | POA: Insufficient documentation

## 2022-02-21 HISTORY — PX: CARDIOVERSION: SHX1299

## 2022-02-21 LAB — GLUCOSE, CAPILLARY: Glucose-Capillary: 274 mg/dL — ABNORMAL HIGH (ref 70–99)

## 2022-02-21 SURGERY — CARDIOVERSION
Anesthesia: General

## 2022-02-21 MED ORDER — PROPOFOL 10 MG/ML IV BOLUS
INTRAVENOUS | Status: DC | PRN
  Administered 2022-02-21: 60 mg via INTRAVENOUS

## 2022-02-21 MED ORDER — LIDOCAINE 2% (20 MG/ML) 5 ML SYRINGE
INTRAMUSCULAR | Status: DC | PRN
  Administered 2022-02-21: 75 mg via INTRAVENOUS

## 2022-02-21 MED ORDER — SODIUM CHLORIDE 0.9 % IV SOLN: INTRAVENOUS | Status: DC

## 2022-02-21 MED ORDER — ONDANSETRON HCL 4 MG/2ML IJ SOLN: 4.0000 mg | Freq: Once | INTRAMUSCULAR | Status: DC | PRN

## 2022-02-21 MED ORDER — EPHEDRINE SULFATE (PRESSORS) 50 MG/ML IJ SOLN
INTRAMUSCULAR | Status: DC | PRN
  Administered 2022-02-21: 5 mg via INTRAVENOUS

## 2022-02-21 NOTE — Interval H&P Note (Signed)
History and Physical Interval Note:  02/21/2022 7:36 AM  [name]  has presented today for surgery, with the diagnosis of afib.  The various methods of treatment have been discussed with the patient and family. After consideration of risks, benefits and other options for treatment, the patient has consented to  Procedure(s): CARDIOVERSION (N/A) as a surgical intervention.  The patient's history has been reviewed, patient examined, no change in status, stable for surgery.  I have reviewed the patient's chart and labs.  Questions were answered to the patient's satisfaction.     Freada Bergeron

## 2022-02-21 NOTE — Addendum Note (Signed)
Addendum  created 02/21/22 [id number] by Eligha Bridegroom, CRNA   Intraprocedure Meds edited

## 2022-02-21 NOTE — Anesthesia Postprocedure Evaluation (Signed)
Anesthesia Post Note  Patient: [name]  Procedure(s) Performed: CARDIOVERSION     Patient location during evaluation: PACU Anesthesia Type: General Level of consciousness: awake and alert Pain management: pain level controlled Vital Signs Assessment: post-procedure vital signs reviewed and stable Respiratory status: spontaneous breathing, nonlabored ventilation, respiratory function stable and patient connected to nasal cannula oxygen Cardiovascular status: blood pressure returned to baseline and stable Postop Assessment: no apparent nausea or vomiting Anesthetic complications: no  No notable events documented.  Last Vitals:  Vitals:   02/21/22 0800 02/21/22 0810  BP: 112/77   Pulse: 79 80  Resp: (!) 24 14  Temp:    SpO2: 97% 97%    Last Pain:  Vitals:   02/21/22 0810  TempSrc:   PainSc: 0-No pain                 [name] S

## 2022-02-21 NOTE — CV Procedure (Signed)
Procedure: Electrical Cardioversion Indications:  Atrial Fibrillation  Procedure Details:  Consent: Risks of procedure as well as the alternatives and risks of each were explained to the (patient/caregiver).  Consent for procedure obtained.  Time Out: Verified patient identification, verified procedure, site/side was marked, verified correct patient position, special equipment/implants available, medications/allergies/relevent history reviewed, required imaging and test results available. PERFORMED.  Patient placed on cardiac monitor, pulse oximetry, supplemental oxygen as necessary.  Sedation given:  Propofol 38m; lidocaine 102m ephedrine 5 Pacer pads placed anterior and posterior chest.  Cardioverted 1 time(s).  Cardioversion with synchronized biphasic 150J shock.  Evaluation: Findings: Post procedure EKG shows: NSR Complications: None Patient did tolerate procedure well.  Time Spent Directly with the Patient:  3539mtes   [name] Bergeron20/2024, 7:54 AM

## 2022-02-21 NOTE — Transfer of Care (Signed)
Immediate Anesthesia Transfer of Care Note  Patient: [name]  Procedure(s) Performed: CARDIOVERSION  Patient Location: Endoscopy Unit  Anesthesia Type:MAC  Level of Consciousness: awake, alert , and oriented  Airway & Oxygen Therapy: Patient Spontanous Breathing  Post-op Assessment: Report given to RN and Post -op Vital signs reviewed and stable  Post vital signs: Reviewed and stable  Last Vitals:  Vitals Value Taken Time  BP 121/79   Temp    Pulse 81   Resp 12   SpO2 97%     Last Pain:  Vitals:   02/21/22 0651  TempSrc: Tympanic  PainSc: 0-No pain         Complications: No notable events documented.

## 2022-02-21 NOTE — Anesthesia Procedure Notes (Signed)
Procedure Name: MAC Date/Time: 02/21/2022 7:47 AM  Performed by: Eligha Bridegroom, CRNAPre-anesthesia Checklist: Patient identified, Emergency Drugs available, Suction available, Patient being monitored and Timeout performed Patient Re-evaluated:Patient Re-evaluated prior to induction Oxygen Delivery Method: Ambu bag Preoxygenation: Pre-oxygenation with 100% oxygen Induction Type: IV induction Ventilation: Mask ventilation without difficulty

## 2022-02-21 NOTE — Anesthesia Preprocedure Evaluation (Signed)
Anesthesia Evaluation  Patient identified by MRN, date of birth, ID band Patient awake    Reviewed: Allergy & Precautions, H&P , NPO status , Patient's Chart, lab work & pertinent test results  Airway Mallampati: II  TM Distance: >3 FB Neck ROM: Full    Dental no notable dental hx.    Pulmonary neg pulmonary ROS   Pulmonary exam normal breath sounds clear to auscultation       Cardiovascular hypertension, + CAD, + Past MI, + Cardiac Stents, + CABG and +CHF  Normal cardiovascular exam+ dysrhythmias Atrial Fibrillation + Cardiac Defibrillator  Rhythm:Irregular Rate:Normal  1. Left ventricular ejection fraction, by estimation, is 25 to 30%. The  left ventricle has severely decreased function. The left ventricle  demonstrates global hypokinesis. Left ventricular diastolic parameters are  indeterminate.   2. Right ventricular systolic function is mildly reduced. The right  ventricular size is mildly enlarged. There is moderately elevated  pulmonary artery systolic pressure. The estimated right ventricular  systolic pressure is 99991111 mmHg.   3. Left atrial size was mildly dilated.   4. The mitral valve is normal in structure. Mild mitral valve  regurgitation. No evidence of mitral stenosis.   5. Tricuspid valve regurgitation is mild to moderate.   6. The aortic valve is tricuspid. Aortic valve regurgitation is not  visualized. No aortic stenosis is present.   7. Aortic dilatation noted. There is mild dilatation of the ascending  aorta, measuring 40 mm.   8. The inferior vena cava is dilated in size with >50% respiratory  variability, suggesting right atrial pressure of 8 mmHg.   9. The patient was in atrial fibrillation.   FINDINGS   Left Ventricle: Left ventricular ejection fraction, by estimation, is 25  to 30%. The left ventricle has severely decreased function. The left  ventricle demonstrates global hypokinesis. The left  ventricular internal  cavity size was normal in size. There  is no left ventricular hypertrophy. Left ventricular diastolic parameters  are indeterminate.   Right Ventricle: The right ventricular size is mildly enlarged. No  increase in right ventricular wall thickness. Right ventricular systolic  function is mildly reduced. There is moderately elevated pulmonary artery  systolic pressure. The tricuspid  regurgitant velocity is 3.17 m/s, and with an assumed right atrial  pressure of 8 mmHg, the estimated right ventricular systolic pressure is  99991111 mmHg.     Neuro/Psych negative neurological ROS  negative psych ROS   GI/Hepatic negative GI ROS, Neg liver ROS,,,  Endo/Other  diabetes, Type 2    Renal/GU negative Renal ROS  negative genitourinary   Musculoskeletal negative musculoskeletal ROS (+)    Abdominal   Peds negative pediatric ROS (+)  Hematology negative hematology ROS (+)   Anesthesia Other Findings   Reproductive/Obstetrics negative OB ROS                             Anesthesia Physical Anesthesia Plan  ASA: 4  Anesthesia Plan: General   Post-op Pain Management: Minimal or no pain anticipated   Induction: Intravenous  PONV Risk Score and Plan: 2 and Treatment may vary due to age or medical condition  Airway Management Planned: Mask  Additional Equipment:   Intra-op Plan:   Post-operative Plan:   Informed Consent: I have reviewed the patients History and Physical, chart, labs and discussed the procedure including the risks, benefits and alternatives for the proposed anesthesia with the patient or authorized representative who  has indicated his/her understanding and acceptance.     Dental advisory given  Plan Discussed with: CRNA and Surgeon  Anesthesia Plan Comments:        Anesthesia Quick Evaluation

## 2022-02-21 NOTE — Addendum Note (Signed)
Addendum  created 02/21/22 0823 by Eligha Bridegroom, CRNA   Flowsheet accepted

## 2022-02-22 ENCOUNTER — Encounter (HOSPITAL_COMMUNITY): Payer: Self-pay | Admitting: Cardiology

## 2022-02-28 ENCOUNTER — Encounter (HOSPITAL_COMMUNITY): Payer: Self-pay | Admitting: Anesthesiology

## 2022-03-29 ENCOUNTER — Ambulatory Visit (INDEPENDENT_AMBULATORY_CARE_PROVIDER_SITE_OTHER): Payer: Medicare Other | Admitting: Primary Care

## 2022-03-29 ENCOUNTER — Encounter: Payer: Self-pay | Admitting: Primary Care

## 2022-03-29 VITALS — BP 118/62 | HR 60 | Temp 97.9°F | Ht 67.99 in | Wt 199.0 lb

## 2022-03-29 DIAGNOSIS — G4700 Insomnia, unspecified: Secondary | ICD-10-CM | POA: Diagnosis not present

## 2022-03-29 DIAGNOSIS — E782 Mixed hyperlipidemia: Secondary | ICD-10-CM

## 2022-03-29 DIAGNOSIS — N182 Chronic kidney disease, stage 2 (mild): Secondary | ICD-10-CM | POA: Diagnosis not present

## 2022-03-29 DIAGNOSIS — Z95810 Presence of automatic (implantable) cardiac defibrillator: Secondary | ICD-10-CM | POA: Diagnosis not present

## 2022-03-29 DIAGNOSIS — I10 Essential (primary) hypertension: Secondary | ICD-10-CM

## 2022-03-29 DIAGNOSIS — E1122 Type 2 diabetes mellitus with diabetic chronic kidney disease: Secondary | ICD-10-CM | POA: Diagnosis not present

## 2022-03-29 DIAGNOSIS — I25810 Atherosclerosis of coronary artery bypass graft(s) without angina pectoris: Secondary | ICD-10-CM

## 2022-03-29 DIAGNOSIS — Z125 Encounter for screening for malignant neoplasm of prostate: Secondary | ICD-10-CM

## 2022-03-29 DIAGNOSIS — N400 Enlarged prostate without lower urinary tract symptoms: Secondary | ICD-10-CM

## 2022-03-29 DIAGNOSIS — Z0000 Encounter for general adult medical examination without abnormal findings: Secondary | ICD-10-CM | POA: Diagnosis not present

## 2022-03-29 DIAGNOSIS — Z794 Long term (current) use of insulin: Secondary | ICD-10-CM | POA: Diagnosis not present

## 2022-03-29 DIAGNOSIS — I480 Paroxysmal atrial fibrillation: Secondary | ICD-10-CM | POA: Diagnosis not present

## 2022-03-29 LAB — POCT GLYCOSYLATED HEMOGLOBIN (HGB A1C): Hemoglobin A1C: 9.3 % — AB (ref 4.0–5.6)

## 2022-03-29 NOTE — Assessment & Plan Note (Signed)
>>  ASSESSMENT AND PLAN FOR CAD (CORONARY ARTERY DISEASE) OF ARTERY BYPASS GRAFT WRITTEN ON 03/29/2022  7:30 AM BY CLARK, KATHERINE K, NP  Asymptomatic.  Continue Imdur  30 mg daily, Entresto  BID. Following with cardiology.

## 2022-03-29 NOTE — Assessment & Plan Note (Signed)
Not currently on treatment. Following with cardiology. Reviewed lipid panel from May 2023.

## 2022-03-29 NOTE — Addendum Note (Signed)
Addended by: Ellamae Sia on: 03/29/2022 10:13 AM   Modules accepted: Orders

## 2022-03-29 NOTE — Assessment & Plan Note (Signed)
Asymptomatic.  Continue Imdur 30 mg daily, Entresto BID. Following with cardiology.

## 2022-03-29 NOTE — Assessment & Plan Note (Signed)
Immunizations UTD. Colonoscopy UTD, no further screening given age. PSA due and pending.  Discussed the importance of a healthy diet and regular exercise in order for weight loss, and to reduce the risk of further co-morbidity.  Exam stable. Labs pending.  Follow up in 1 year for repeat physical.

## 2022-03-29 NOTE — Assessment & Plan Note (Signed)
Controlled.  Continue carvedilol 6.25 mg BID, Entresto 24-26 mg BID, Imdur 30 mg daily. Reviewed BMP from January 2024.

## 2022-03-29 NOTE — Patient Instructions (Signed)
We are increasing your Ozempic to 1 mg weekly for diabetes.  Limit starchy foods that will raise your blood sugar:  Beans (except for green beans), corn, bread, potatoes, rice, pasta, ice cream, sugary foods, sugary drinks, fast food, fried food (french fries).   Increase green vegetables, fruit, lean protein.   Please schedule a follow up visit for 3 months.  It was a pleasure to see you today!

## 2022-03-29 NOTE — Progress Notes (Signed)
Subjective:    Patient ID: [name], male    DOB: 02/13/1945, 77 y.o.   MRN: [id number]  HPI  [name] is a very pleasant 77 y.o. male with a significant medical history including CAD, atrial fibrillation, hypertension, NASH, uncontrolled type 2 diabetes, hyperlipidemia, ICD in place who presents today for complete physical and follow up of chronic conditions.  Immunizations: -Tetanus: Completed in 2013 -Influenza: Completed this season -Shingles: Completed Shingrix series -Pneumonia: Completed Prevnar 13 in 2016 and Pneumovax in 2018  Diet: [place]. Lots of beans, potatoes, ice cream every other night.  Exercise: No regular exercise.  Eye exam: Completes annually  Dental exam: Completes semi-annually    Colonoscopy: Completed in 2022, due 2027 if clinically appropriate.   PSA: Due  BP Readings from Last 3 Encounters:  03/29/22 118/62  02/21/22 (!) 114/101  02/15/22 138/79        Review of Systems  Constitutional:  Negative for unexpected weight change.  HENT:  Negative for rhinorrhea.   Respiratory:  Negative for cough and shortness of breath.   Cardiovascular:  Negative for chest pain.  Gastrointestinal:  Negative for constipation and diarrhea.  Genitourinary:  Negative for difficulty urinating.  Musculoskeletal:  Positive for arthralgias.  Skin:  Negative for rash.  Allergic/Immunologic: Negative for environmental allergies.  Neurological:  Negative for dizziness and headaches.  Psychiatric/Behavioral:  The patient is not nervous/anxious.          Past Medical History:  Diagnosis Date   Arthritis    "fingers, shoulders" (08/03/2015)   Atrial fibrillation (HCC)    Paroxysmal, rare episodes, sinus rhythm on flecainide, patient prefers not to take Coumadin   Bradycardia    April, 2013   Chest pain    Nuclear, 2006, no ischemia   Chronic lower back pain    "all my life"   Coronary artery disease    s/p staged cath 05/12/2014 and 5/11, DES x 2  to heavily calcified RCA, residual with 60% prox LAD, 70% D1   Ejection fraction    EF 55-60%,  echo, January, 2011   Elevated CPK    CPK elevated with normal MB and normal troponin the past   Heart murmur    History of echocardiogram    Echo 8/16: EF 55%, normal wall motion, grade 2 diastolic dysfunction, trivial MR, normal RV function, PASP 27 mmHg   Hypertension    IBS (irritable bowel syndrome)    Myocardial infarction ([place]) 06/2014   Sinus drainage    Type II diabetes mellitus ([place])     Social History   Socioeconomic History   Marital status: Married    Spouse name: Not on file   Number of children: Not on file   Years of education: Not on file   Highest education level: Not on file  Occupational History   Not on file  Tobacco Use   Smoking status: Never   Smokeless tobacco: Never  Vaping Use   Vaping Use: Never used  Substance and Sexual Activity   Alcohol use: No   Drug use: No   Sexual activity: Not on file  Other Topics Concern   Not on file  Social History Narrative   Not on file   Social Determinants of Health   Financial Resource Strain: Low Risk  (01/11/2022)   Overall Financial Resource Strain (CARDIA)    Difficulty of Paying Living Expenses: Not hard at all  Food Insecurity: No Food Insecurity (01/11/2022)   Hunger  Vital Sign    Worried About Charity fundraiser in the Last Year: Never true    Ran Out of Food in the Last Year: Never true  Transportation Needs: No Transportation Needs (01/11/2022)   PRAPARE - Hydrologist (Medical): No    Lack of Transportation (Non-Medical): No  Physical Activity: Sufficiently Active (01/11/2022)   Exercise Vital Sign    Days of Exercise per Week: 3 days    Minutes of Exercise per Session: 150+ min  Stress: No Stress Concern Present (01/11/2022)   [place]    Feeling of Stress : Not at all  Social Connections: [place] (01/11/2022)   Social Connection and Isolation Panel [NHANES]    Frequency of Communication with Friends and Family: More than three times a week    Frequency of Social Gatherings with Friends and Family: More than three times a week    Attends Religious Services: More than 4 times per year    Active Member of Clubs or Organizations: Yes    Attends Archivist Meetings: More than 4 times per year    Marital Status: Married  Human resources officer Violence: Not At Risk (01/11/2022)   Humiliation, Afraid, Rape, and Kick questionnaire    Fear of Current or Ex-Partner: No    Emotionally Abused: No    Physically Abused: No    Sexually Abused: No    Past Surgical History:  Procedure Laterality Date   APPENDECTOMY     CARDIAC CATHETERIZATION N/A 05/12/2014   Procedure: Left Heart Cath and Coronary Angiography;  Surgeon: Troy Sine, MD;  Location: [facility];  Service: Cardiovascular;  Laterality: N/A;   CARDIAC CATHETERIZATION N/A 05/13/2014   Procedure: Coronary/Graft Atherectomy;  Surgeon: Troy Sine, MD;  Location: [facility];  Service: Cardiovascular;  Laterality: N/A;   CARDIAC CATHETERIZATION Right 05/13/2014   Procedure: Temporary Pacemaker;  Surgeon: Troy Sine, MD;  Location: [facility];  Service: Cardiovascular;  Laterality: Right;   CARDIAC CATHETERIZATION N/A 05/13/2014   Procedure: Coronary Stent Intervention;  Surgeon: Troy Sine, MD;  Location: [facility];  Service: Cardiovascular;  Laterality: N/A;   CARDIAC CATHETERIZATION N/A 06/18/2014   Procedure: Left Heart Cath and Coronary Angiography;  Surgeon: Peter M Martinique, MD;  Location: [facility];  Service: Cardiovascular;  Laterality: N/A;   CARDIAC CATHETERIZATION N/A 08/03/2015   Procedure: Left Heart Cath and Cors/Grafts Angiography;  Surgeon: Nelva Bush, MD;  Location: [facility];  Service: Cardiovascular;  Laterality: N/A;   CARDIOVASCULAR STRESS TEST   05/06/2014   CARDIOVERSION N/A 02/21/2022   Procedure: CARDIOVERSION;  Surgeon: Freada Bergeron, MD;  Location: [facility];  Service: Cardiovascular;  Laterality: N/A;   CATARACT EXTRACTION W/ INTRAOCULAR LENS IMPLANT Left    COLONOSCOPY WITH PROPOFOL N/A 02/13/2020   Procedure: COLONOSCOPY WITH PROPOFOL;  Surgeon: Carol Ada, MD;  Location: [facility];  Service: Endoscopy;  Laterality: N/A;   CORONARY ARTERY BYPASS GRAFT N/A 06/22/2014   Procedure: CORONARY ARTERY BYPASS GRAFTING (CABG) x five, using left internal mammary artery, and right leg greater saphenous vein harvested endoscopically;  Surgeon: Melrose Nakayama, MD;  Location: [place] [facility];  Service: Open Heart Surgery;  Laterality: N/A;   ICD IMPLANT N/A 01/07/2019   Procedure: ICD IMPLANT;  Surgeon: Thompson Grayer, MD;  Location: [facility];  Service: Cardiovascular;  Laterality: N/A;  LAPAROSCOPIC CHOLECYSTECTOMY     LEFT HEART CATH AND CORS/GRAFTS ANGIOGRAPHY N/A 01/06/2019   Procedure: LEFT HEART CATH AND CORS/GRAFTS ANGIOGRAPHY;  Surgeon: Nelva Bush, MD;  Location: [place] [facility];  Service: Cardiovascular;  Laterality: N/A;   MAZE N/A 06/22/2014   Procedure: MAZE;  Surgeon: Melrose Nakayama, MD;  Location: [facility];  Service: Open Heart Surgery;  Laterality: N/A;   POLYPECTOMY  02/13/2020   Procedure: POLYPECTOMY;  Surgeon: Carol Ada, MD;  Location: [facility];  Service: Endoscopy;;   TEE WITHOUT CARDIOVERSION N/A 06/22/2014   Procedure: TRANSESOPHAGEAL ECHOCARDIOGRAM (TEE);  Surgeon: Melrose Nakayama, MD;  Location: [facility];  Service: Open Heart Surgery;  Laterality: N/A;    Family History  Problem Relation Age of Onset   Alzheimer's disease Mother    Emphysema Father    Cancer Brother    Arrhythmia Sister    Heart attack Sister    Heart disease Sister    Hyperlipidemia Sister    Hypertension Sister     Allergies  Allergen Reactions   Lipitor [Atorvastatin] Other (See Comments)    Myopathy  Spring 2006   Pravastatin Other (See Comments)    LEG CRAMPS   Simvastatin Other (See Comments)    LEG CRAMPS    Current Outpatient Medications on File Prior to Visit  Medication Sig Dispense Refill   albuterol (VENTOLIN HFA) 108 (90 Base) MCG/ACT inhaler Inhale 2 puffs into the lungs every 6 (six) hours as needed for wheezing or shortness of breath. 8 g 3   carvedilol (COREG) 6.25 MG tablet TAKE 1 TABLET BY MOUTH TWICE A DAY WITH MEAL 180 tablet 1   Continuous Blood Gluc Receiver (FREESTYLE LIBRE 2 READER) DEVI Use to check blood sugars. 1 each 0   Continuous Blood Gluc Sensor (FREESTYLE LIBRE 3 SENSOR) MISC Place 1 sensor on the skin every 14 days. Use to check glucose continuously 6 each 1   dapagliflozin propanediol (FARXIGA) 10 MG TABS tablet Take 1 tablet (10 mg total) by mouth daily before breakfast. For diabetes. 90 tablet 1   ELIQUIS 5 MG TABS tablet TAKE 1 TABLET BY MOUTH TWICE A DAY 60 tablet 5   insulin degludec (TRESIBA FLEXTOUCH) 200 UNIT/ML FlexTouch Pen Inject 100 Units into the skin daily. Via [employer] PAP (Patient taking differently: Inject 50 Units into the skin 2 (two) times daily. Via [employer] Cares PAP) 36 mL 0   Insulin Pen Needle 31G X 8 MM MISC Use as directed with Lantus. 100 each 5   isosorbide mononitrate (IMDUR) 30 MG 24 hr tablet TAKE 1 TABLET BY MOUTH EVERY DAY 90 tablet 2   nitroGLYCERIN (NITROSTAT) 0.4 MG SL tablet Place 1 tablet (0.4 mg total) under the tongue every 5 (five) minutes as needed for chest pain. 25 tablet 6   ondansetron (ZOFRAN-ODT) 4 MG disintegrating tablet Take 1 tablet (4 mg total) by mouth every 8 (eight) hours as needed for nausea or vomiting. 15 tablet 0   OVER THE COUNTER MEDICATION Place 1 drop into both eyes 2 (two) times daily. Happy body  liquid msm eye drops/ for floaters in the eyes     sacubitril-valsartan (ENTRESTO) 24-26 MG Take 1 tablet by mouth 2 (two) times daily. 180 tablet 2   Semaglutide, 1 MG/DOSE, (OZEMPIC, 1 MG/DOSE,) 4  MG/3ML SOPN Inject 0.5 mg into the skin once a week. Via Novo Cares PAP     tamsulosin (FLOMAX) 0.4 MG CAPS capsule TAKE 1 CAPSULE (0.4 MG TOTAL) BY  MOUTH DAILY. FOR URINE FLOW. 90 capsule 0   No current facility-administered medications on file prior to visit.    BP 118/62   Pulse 60   Temp 97.9 F (36.6 C) (Temporal)   Ht 5' 7.99" (1.727 m)   Wt 199 lb (90.3 kg)   SpO2 100%   BMI 30.27 kg/m  Objective:   Physical Exam HENT:     Right Ear: Tympanic membrane and ear canal normal.     Left Ear: Tympanic membrane and ear canal normal.     Nose: Nose normal.     Right Sinus: No maxillary sinus tenderness or frontal sinus tenderness.     Left Sinus: No maxillary sinus tenderness or frontal sinus tenderness.  Eyes:     Conjunctiva/sclera: Conjunctivae normal.  Neck:     Thyroid: No thyromegaly.     Vascular: No carotid bruit.  Cardiovascular:     Rate and Rhythm: Normal rate and regular rhythm.     Heart sounds: Normal heart sounds.  Pulmonary:     Effort: Pulmonary effort is normal.     Breath sounds: Normal breath sounds. No wheezing or rales.  Abdominal:     General: Bowel sounds are normal.     Palpations: Abdomen is soft.     Tenderness: There is no abdominal tenderness.  Musculoskeletal:        General: Normal range of motion.     Cervical back: Neck supple.  Skin:    General: Skin is warm and dry.  Neurological:     Mental Status: He is alert and oriented to person, place, and time.     Cranial Nerves: No cranial nerve deficit.     Deep Tendon Reflexes: Reflexes are normal and symmetric.  Psychiatric:        Mood and Affect: Mood normal.           Assessment & Plan:  Preventative health care Assessment & Plan: Immunizations UTD. Colonoscopy UTD, no further screening given age. PSA due and pending.  Discussed the importance of a healthy diet and regular exercise in order for weight loss, and to reduce the risk of further co-morbidity.  Exam  stable. Labs pending.  Follow up in 1 year for repeat physical.    Type 2 diabetes mellitus with stage 2 chronic kidney disease, with long-term current use of insulin (HCC) Assessment & Plan: Uncontrolled with A1C 9.3.   Long discussion today regarding his diet which consists of starchy foods and sweets. Offered a nutritionist referral for which he declines. Again, offered endocrinology referral for which he declines.   Increase Ozempic to 2 mg weekly. Continue Farxiga 10 mg daily. Continue Tresiba 100 units daily.  Follow up in 3 months.   Orders: -     POCT glycosylated hemoglobin (Hb A1C)  Paroxysmal atrial fibrillation [facility]) Assessment & Plan: Recently s/p cardiac ablation from February 2024. Continue Eliquis 5 mg BID, carvedilol 6.25 mg BID.  Reviewed cardiology notes from January 2024   Coronary artery disease involving coronary bypass graft of native heart without angina pectoris Assessment & Plan: Asymptomatic.  Continue Imdur 30 mg daily, Entresto BID. Following with cardiology.    Primary hypertension Assessment & Plan: Controlled.  Continue carvedilol 6.25 mg BID, Entresto 24-26 mg BID, Imdur 30 mg daily. Reviewed BMP from January 2024.  Orders: -     Hepatic function panel -     TSH  Benign prostatic hyperplasia without lower urinary tract symptoms Assessment & Plan: Overall controlled.  Continue Flomax 0.4 mg daily.   Mixed hyperlipidemia Assessment & Plan: Not currently on treatment. Following with cardiology. Reviewed lipid panel from May 2023.   ICD (implantable cardioverter-defibrillator) in place Assessment & Plan: Reviewed remote ICD check from January 2024.   Insomnia, unspecified type Assessment & Plan: Controlled.  No concerns today.   Screening for prostate cancer -     PSA, Medicare        [name], NP

## 2022-03-29 NOTE — Assessment & Plan Note (Signed)
Recently s/p cardiac ablation from February 2024. Continue Eliquis 5 mg BID, carvedilol 6.25 mg BID.  Reviewed cardiology notes from January 2024

## 2022-03-29 NOTE — Assessment & Plan Note (Signed)
Reviewed remote ICD check from January 2024.

## 2022-03-29 NOTE — Assessment & Plan Note (Signed)
Overall controlled. Continue Flomax 0.4 mg daily.

## 2022-03-29 NOTE — Assessment & Plan Note (Signed)
Uncontrolled with A1C 9.3.   Long discussion today regarding his diet which consists of starchy foods and sweets. Offered a nutritionist referral for which he declines. Again, offered endocrinology referral for which he declines.   Increase Ozempic to 2 mg weekly. Continue Farxiga 10 mg daily. Continue Tresiba 100 units daily.  Follow up in 3 months.

## 2022-03-29 NOTE — Assessment & Plan Note (Signed)
Controlled. No concerns today. 

## 2022-03-30 ENCOUNTER — Other Ambulatory Visit: Payer: Self-pay | Admitting: Interventional Cardiology

## 2022-03-30 ENCOUNTER — Other Ambulatory Visit (INDEPENDENT_AMBULATORY_CARE_PROVIDER_SITE_OTHER): Payer: Medicare Other

## 2022-03-30 DIAGNOSIS — I10 Essential (primary) hypertension: Secondary | ICD-10-CM

## 2022-03-30 DIAGNOSIS — Z125 Encounter for screening for malignant neoplasm of prostate: Secondary | ICD-10-CM | POA: Diagnosis not present

## 2022-03-30 LAB — HEPATIC FUNCTION PANEL
ALT: 32 U/L (ref 0–53)
AST: 22 U/L (ref 0–37)
Albumin: 4.2 g/dL (ref 3.5–5.2)
Alkaline Phosphatase: 70 U/L (ref 39–117)
Bilirubin, Direct: 0.1 mg/dL (ref 0.0–0.3)
Total Bilirubin: 0.7 mg/dL (ref 0.2–1.2)
Total Protein: 7.1 g/dL (ref 6.0–8.3)

## 2022-03-30 LAB — TSH: TSH: 1.75 u[IU]/mL (ref 0.35–5.50)

## 2022-03-30 LAB — PSA, MEDICARE: PSA: 1.84 ng/ml (ref 0.10–4.00)

## 2022-04-02 ENCOUNTER — Other Ambulatory Visit: Payer: Self-pay | Admitting: Primary Care

## 2022-04-02 DIAGNOSIS — N400 Enlarged prostate without lower urinary tract symptoms: Secondary | ICD-10-CM

## 2022-04-03 ENCOUNTER — Telehealth: Payer: Self-pay | Admitting: Pharmacist

## 2022-04-03 ENCOUNTER — Ambulatory Visit: Payer: Medicare Other | Admitting: Pharmacist

## 2022-04-03 NOTE — Progress Notes (Unsigned)
Care Management & Coordination Services Pharmacy Note  04/03/2022 Name:  [name] MRN:  [id number] DOB:  March 12, 1945  Summary: F/U visit -DM: A1c 9.3% (03/2022) above goal; pt has apparently mixed up his insulins - he has been taking Novolog 100 units BID, and has not been taking [place] at all; reviewed difference between insulins and correct dosing regimen  -Reviewed AGP report: 03/21/22 to 04/03/22. Sensor active: 98%  Time in range (70-180): 16% (goal > 70%)  High (180-250): 14%  Very high (>250): 69%  Low (< 70): 1% (goal < 4%)  GMI: 10.2%; Average glucose: 286  Recommendations/Changes made from today's visit: -Resume Tresiba at 80 units once daily (20% reduction) -Resume Novolog at 20 units TID w/ meals  Follow up plan: -Pharmacist follow up televisit scheduled for 1 week -Cardiology appt 05/01/22; PCP appt 06/29/22    Subjective: [name] is an 77 y.o. year old male who is a primary patient of [name] Koch, NP.  The care coordination team was consulted for assistance with disease management and care coordination needs.    Engaged with patient by telephone for follow up visit.  Recent office visits: 03/29/22 NP [name] OV: f/u - A1c 9.3%; increase Ozempic to 1 mg; declined nutrition and endo referral, pt declined.  Recent consult visits: ECHO 02/13/22: EF 25-30%. Stop lisinopril, start Entresto 3 days later.  01/31/22 PA Charlcie Cradle (Cardiology): f/u - persistent afib, 100% burden. Consider amiodarone or Tikosyn - order Echo. Schedule DCCV. 2/14.  Hospital visits: 02/21/22 planned admission - DCCV.   Objective:  Lab Results  Component Value Date   CREATININE 1.13 01/31/2022   BUN 15 01/31/2022   GFR 53.87 (L) 11/30/2021   EGFR 67 01/31/2022   GFRNONAA >60 01/07/2019   GFRAA >60 01/07/2019   NA 135 01/31/2022   K 4.3 01/31/2022   CALCIUM 9.3 01/31/2022   CO2 20 01/31/2022   GLUCOSE 324 (H) 01/31/2022    Lab Results  Component Value Date/Time   HGBA1C  9.3 (A) 03/29/2022 07:41 AM   HGBA1C 8.1 (A) 12/22/2021 10:38 AM   HGBA1C 9.2 (H) 01/06/2019 01:51 AM   HGBA1C 9.5 (H) 09/13/2018 09:04 AM   GFR 53.87 (L) 11/30/2021 03:17 PM   GFR 75.44 12/07/2020 08:35 AM   MICROALBUR 1.6 12/22/2021 11:10 AM   MICROALBUR 1.6 11/04/2015 08:26 AM    Last diabetic Eye exam:  Lab Results  Component Value Date/Time   HMDIABEYEEXA Retinopathy (A) 11/26/2019 12:00 AM    Last diabetic Foot exam: No results found for: "HMDIABFOOTEX"   Lab Results  Component Value Date   CHOL 176 05/04/2021   HDL 44 05/04/2021   LDLCALC 125 (H) 05/04/2021   TRIG 35 05/04/2021   CHOLHDL 4.0 05/04/2021       Latest Ref Rng & Units 03/30/2022    8:46 AM 05/04/2021    9:12 AM 12/07/2020    8:35 AM  Hepatic Function  Total Protein 6.0 - 8.3 g/dL 7.1  7.0  7.3   Albumin 3.5 - 5.2 g/dL 4.2  4.4  4.1   AST 0 - 37 U/L 22  33  48   ALT 0 - 53 U/L 32  42  114   Alk Phosphatase 39 - 117 U/L 70  107  114   Total Bilirubin 0.2 - 1.2 mg/dL 0.7  1.0  3.1   Bilirubin, Direct 0.0 - 0.3 mg/dL 0.1       Lab Results  Component Value Date/Time  TSH 1.75 03/30/2022 08:46 AM   TSH 1.529 01/06/2019 10:40 AM   TSH 0.748 06/18/2014 04:57 AM       Latest Ref Rng & Units 01/31/2022   11:49 AM 11/30/2021    3:17 PM 05/04/2021    9:12 AM  CBC  WBC 3.4 - 10.8 x10E3/uL 7.6  13.6  7.0   Hemoglobin 13.0 - 17.7 g/dL 15.6  17.4  13.9   Hematocrit 37.5 - 51.0 % 46.4  52.5  42.1   Platelets 150 - 450 x10E3/uL 242  259.0  193     No results found for: "VD25OH", "VITAMINB12"  Clinical ASCVD: Yes  The ASCVD Risk score (Arnett DK, et al., 2019) failed to calculate for the following reasons:   The patient has a prior MI or stroke diagnosis    CHA2DS2/VAS Stroke Risk Points  Current as of 30 minutes ago     6 >= 2 Points: High Risk    Points Metrics  1 Has Congestive Heart Failure:  Yes    Current as of 30 minutes ago  1 Has Vascular Disease:  Yes    Current as of 30 minutes ago  1 Has  Hypertension:  Yes    Current as of 30 minutes ago  2 Age:  35    Current as of 30 minutes ago  1 Has Diabetes:  Yes    Current as of 30 minutes ago  0 Had Stroke:  No  Had TIA:  No  Had Thromboembolism:  No    Current as of 30 minutes ago  0 Male:  No    Current as of 30 minutes ago        03/29/2022    7:26 AM 01/11/2022    3:22 PM 01/07/2021    3:34 PM  Depression screen PHQ 2/9  Decreased Interest 0 0 0  Down, Depressed, Hopeless 0 0 0  PHQ - 2 Score 0 0 0     Social History   Tobacco Use  Smoking Status Never  Smokeless Tobacco Never   BP Readings from Last 3 Encounters:  03/29/22 118/62  02/21/22 (!) 114/101  02/15/22 138/79   Pulse Readings from Last 3 Encounters:  03/29/22 60  02/21/22 (!) 103  02/15/22 94   Wt Readings from Last 3 Encounters:  03/29/22 199 lb (90.3 kg)  02/21/22 190 lb 0.6 oz (86.2 kg)  02/15/22 190 lb (86.2 kg)   BMI Readings from Last 3 Encounters:  03/29/22 30.27 kg/m  02/21/22 28.90 kg/m  02/15/22 28.89 kg/m    Allergies  Allergen Reactions   Lipitor [Atorvastatin] Other (See Comments)    Myopathy Spring 2006   Pravastatin Other (See Comments)    LEG CRAMPS   Simvastatin Other (See Comments)    LEG CRAMPS    Medications Reviewed Today     Reviewed by Charlton Haws, [facility] (Pharmacist) on 04/03/22 at [place] List Status: <None>   Medication Order Taking? Sig Documenting Provider Last Dose Status Informant  albuterol (VENTOLIN HFA) 108 (90 Base) MCG/ACT inhaler [id number] Yes Inhale 2 puffs into the lungs every 6 (six) hours as needed for wheezing or shortness of breath. Copland, Frederico Hamman, MD Taking Active Self  carvedilol (COREG) 6.25 MG tablet [id number] Yes TAKE 1 TABLET BY MOUTH TWICE A DAY WITH MEAL [name] Booze, MD Taking Active Self  Continuous Blood Gluc Receiver (FREESTYLE LIBRE 2 READER) DEVI [id number] Yes Use to check blood sugars. [name] Koch, NP Taking Active  Self  Continuous Blood Gluc  Sensor (FREESTYLE LIBRE 3 SENSOR) MISC [id number] Yes Place 1 sensor on the skin every 14 days. Use to check glucose continuously [name] Koch, NP Taking Active Self  dapagliflozin propanediol (FARXIGA) 10 MG TABS tablet [id number] Yes Take 1 tablet (10 mg total) by mouth daily before breakfast. For diabetes. [name] Koch, NP Taking Active Self  ELIQUIS 5 MG TABS tablet [id number] Yes TAKE 1 TABLET BY MOUTH TWICE A DAY [name] Booze, MD Taking Active Self  insulin aspart (NOVOLOG FLEXPEN) 100 UNIT/ML FlexPen [id number] Yes Inject 20 Units into the skin 3 (three) times daily with meals. [provider] Taking Active            Med Note Luna Glasgow Apr 03, 2022  4:27 PM) Pt was mistakenly taking 100 units BID (confused with Tyler Aas)  insulin degludec (TRESIBA FLEXTOUCH) 200 UNIT/ML FlexTouch Pen [id number] No Inject 100 Units into the skin daily. Via [employer] PAP  Patient not taking: Reported on 04/03/2022   [name] Koch, NP Not Taking Active Self  Insulin Pen Needle 31G X 8 MM MISC [id number] Yes Use as directed with Lantus. [name] Koch, NP Taking Active Self  isosorbide mononitrate (IMDUR) 30 MG 24 hr tablet [id number] Yes TAKE 1 TABLET BY MOUTH EVERY DAY [name] Booze, MD Taking Active Self  nitroGLYCERIN (NITROSTAT) 0.4 MG SL tablet [id number] Yes Place 1 tablet (0.4 mg total) under the tongue every 5 (five) minutes as needed for chest pain. [name] Booze, MD Taking Active Self  ondansetron (ZOFRAN-ODT) 4 MG disintegrating tablet [id number] Yes Take 1 tablet (4 mg total) by mouth every 8 (eight) hours as needed for nausea or vomiting. [name] Koch, NP Taking Active Self  OVER THE COUNTER MEDICATION [id number] Yes Place 1 drop into both eyes 2 (two) times daily. Happy body  liquid msm eye drops/ for floaters in the eyes [provider] Taking Active Self  sacubitril-valsartan (ENTRESTO) 24-26 MG [id number] Yes Take 1 tablet by  mouth 2 (two) times daily. Baldwin [place], PA-C Taking Active   Semaglutide, 1 MG/DOSE, (OZEMPIC, 1 MG/DOSE,) 4 MG/3ML SOPN [id number] Yes Inject 1 mg into the skin once a week. Via [employer] PAP [provider] Taking Active Self           Med Note Lenor Derrick Feb 13, 2022  1:01 PM)    tamsulosin (FLOMAX) 0.4 MG CAPS capsule [id number] Yes TAKE 1 CAPSULE (0.4 MG TOTAL) BY MOUTH DAILY. FOR URINE FLOW. [name] Koch, NP Taking Active             SDOH:  (Social Determinants of Health) assessments and interventions performed: No SDOH Interventions    Flowsheet Row Clinical Support from 01/11/2022 in [place] at [place] Management from 12/21/2020 in [place] at [place] from 09/13/2018 in [place] at [place] from 08/24/2016 in [place] at [place] Interventions Intervention Not Indicated -- -- --  Housing Interventions Intervention Not Indicated -- -- --  Transportation Interventions Intervention Not Indicated -- -- --  Utilities Interventions Intervention Not Indicated -- -- --  Depression Interventions/Treatment  -- -- [id number] Score <4 Follow-up Not Indicated --  [pt is being referred to PCP for further evaluation]  Financial Strain Interventions Intervention Not Indicated  Other (Comment)  [Farxiga - PAP,  Eliquis - Pt to let us know when he goes into coverage gap in 2023.] -- --  Physical Activity Interventions Intervention Not Indicated -- -- --  Stress Interventions Intervention Not Indicated -- -- --  Social Connections Interventions Intervention Not Indicated -- -- --       Medication Assistance:  [place] (approved through 01/02/23) Novo Cares - [place], Novolog approved 2024 [place] - [name] (approved through 01/02/23)  Medication Access: Within the  past 30 days, how often has patient missed a dose of medication? 0 Is a pillbox or other method used to improve adherence? Yes  Factors that may affect medication adherence? lack of understanding of disease management Are meds synced by current pharmacy? No  Are meds delivered by current pharmacy? No  Does patient experience delays in picking up medications due to transportation concerns? No   Upstream Services Reviewed: Is patient disadvantaged to use [facility]?: No  Current Rx insurance plan: [employer] Name and location of Current pharmacy:  [id number] [place], [address] [place] [address] Phone: [phone] Fax: [phone]  [facility] services reviewed with patient today?: No  Patient requests to transfer care to [facility] Pharmacy?: No  Reason patient declined to change pharmacies: Not mentioned at this visit  Compliance/Adherence/Medication fill history: Care Gaps: Eye exam (due 11/2020)  Star-Rating Drugs: Farxiga -PAP Ozempic - PAP   Assessment/Plan  Hypertension / Heart Failure (BP goal <130/80) -Controlled - per clinic readings -Last ejection fraction: 25-30% (Date: 02/2022) -HF type: HFrEF (EF < 40%); NYHA Class: I (no actitivty limitation) -Current treatment: Carvedilol 6.25 mg BID - Appropriate, Effective, Safe, Accessible Isosorbide Mononitrate 30 mg daily - Appropriate, Effective, Safe, Accessible Entresto 24-26 mg BID -Appropriate, Effective, Safe, Accessible Farxiga 10 mg daily -Appropriate, Effective, Safe, Accessible -Medications previously tried: none  -Current home readings: none reported -Denies hypotensive/hypertensive symptoms; Denies chest pain, SOB, dizziness, falls. -Educated on importance of daily adherence; reviewed refill history -Counseled to monitor BP at home if symptomatic, document, and provide log at future appointments -Recommended to continue current medication  Hyperlipidemia:  (LDL goal < 70) -Not ideally controlled - LDL 125 (05/2021) off of cholesterol meds -Hx CAD (CABG 2016); follows with cardiology (Dr Irish Lack); he has discussed PSCK9-inhibitors with cardiology pharmacist and deferred due to multiple injections -No aspirin due to Eliquis -Current treatment: Nitroglycerin 0.4 mg SL PRN -Medications previously tried: pravastatin 20 mg, rosuvastatin 10 mg TIW, simvastatin 10 mg TIW, atorvastatin, ezetimibe -Educated on Cholesterol goals;  -Consider Leqvio   Diabetes (A1c goal <7%) -Uncontrolled - A1c 9.3% (3/24), pt has apparently confused [place] and Novolog; he has been taking Novolog 100 units BID, and he has not been taking [place] - he is not sure how long but at least a few weeks  -Current home glucose readings: Freestyle Libre 2 > upgraded to [place] 05 March 2022 -Reviewed AGP report: 03/21/22 to 04/03/22. Sensor active: 98%  Time in range (70-180): 16% (goal > 70%)  High (180-250): 14%  Very high (>250): 69%  Low (< 70): 1% (goal < 4%)  GMI: 10.2%; Average glucose: 286   -Current medications: Farxiga 10 mg daily (PAP) - Appropriate, Query Effective Insulin Degludec U200 - 100 units daily (PAP) - not taking Novolog 20 units TID w/ meals - pt is taking 100 units BID Ozempic 1 mg weekly (PAP) -Appropriate, Query Effective Freestyle Libre 3 (phone) - Appropriate, Effective, Safe, Accessible -Medications previously  tried: glipizide (hypoglycemia) -Discussed difference between [place] and Novolog at length; using Novolog instead of Tyler Aas does explain the extreme swings between low and high glucose he has been experiencing; I am concerned about how will react with resuming full dose Tresiba, so recommend to reduce dose by 20% -Resume Tresiba at 80 units once daily -Resume Novolog at 20 units TID w/ meals -Follow up 1 week with PharmD  Atrial Fibrillation (Goal: prevent stroke and major bleeding) -Followed by cardiology -CHADSVASC: 6 -Reviewed  adherence: Eliquis PDC 53% less than ideal - pt rations Eliquis during the donut hole (Oct-Dec typically) -Current treatment: Carvedilol 6.25 mg BID - Appropriate, Effective, Safe, Accessible Eliquis 5 mg BID -Appropriate, Effective, Safe, Accessible -Medications previously tried: none reported -Home BP and HR readings: none reported -Counseled on increased risk of stroke due to Afib and benefits of anticoagulation for stroke prevention; importance of adherence to anticoagulant exactly as prescribed; avoidance of NSAIDs due to increased bleeding risk with anticoagulants; -Recommended to continue current medication  Health Maintenance -BPH: on tamsulosin   [name], PharmD, Para [place], CPP Clinical Pharmacist Practitioner [place] at [facility] [phone]

## 2022-04-03 NOTE — Telephone Encounter (Signed)
Patient has apparently confused his insulin regimen - he is supposed to be taking [place] U200 - 100 units once daily. After having him verify his insulin pen over the phone, he has actually been using Novolog 100 units twice daily and has not been taking [place] at all. He found 1 [place] pen in the back of his fridge, he is not sure how long he has been using insulin this way but at least a few weeks. This does help explain the highly variable glucose he has been having lately with swings from <70 to > 350.  Discussed difference between [place] and Novolog at length. Since I am not sure how he will react with resuming Tresiba, I recommended to resume with 20% dose reduction. We may quickly increase this back to 100 units depending on blood glucose response.  Advised pt to resume [place] at 80 units daily. Resume Novolog at 20 units TID with meals. Follow up call with PharmD in 1 week with update on blood sugars.   Reviewed AGP report: 03/21/22 to 04/03/22. Sensor active: 98%  Time in range (70-180): 16% (goal > 70%)  High (180-250): 14%  Very high (>250): 69%  Low (< 70): 1% (goal < 4%)  GMI: 10.2%; Average glucose: 286

## 2022-04-03 NOTE — Telephone Encounter (Signed)
Thank you! This patient is a poor historian and never brings medications to his visits despite prior (and ongoing) recommendations. Please update me again in 1 week.

## 2022-04-04 NOTE — Patient Instructions (Signed)
Visit Information  Phone number for Pharmacist: [phone]  Thank you for meeting with me to discuss your medications! Below is a summary of what we talked about during the visit:   Recommendations/Changes made from today's visit: -Resume Tresiba at 80 units once daily (20% reduction) -Resume Novolog at 20 units TID w/ meals  Follow up plan: -Pharmacist follow up televisit scheduled for 1 week -Cardiology appt 05/01/22; PCP appt 06/29/22   [name], PharmD, BCACP Clinical Pharmacist [facility] [phone]

## 2022-04-05 ENCOUNTER — Telehealth: Payer: Self-pay

## 2022-04-05 NOTE — Progress Notes (Signed)
Care Management & Coordination Services Pharmacy Team  Reason for Encounter: Appointment Reminder  Contacted patient to confirm telephone appointment with [name], PharmD on 04/10/2022 at 3:45.  Unsuccessful outreach. Left voicemail for patient to return call.  Star Rating Drugs:  Medication:  Last Fill: Day Supply Farxiga 10 mg  PAP Ozempic 1 mg  PAP  Care Gaps: Annual wellness visit in last year? Yes 01/11/2022  If Diabetic: Last eye exam / retinopathy screening: Overdue Last diabetic foot exam: Up to date  [name], PharmD notified  [name], [place] Assistant [phone]

## 2022-04-07 ENCOUNTER — Ambulatory Visit (INDEPENDENT_AMBULATORY_CARE_PROVIDER_SITE_OTHER): Payer: Medicare Other

## 2022-04-07 DIAGNOSIS — I255 Ischemic cardiomyopathy: Secondary | ICD-10-CM | POA: Diagnosis not present

## 2022-04-07 LAB — CUP PACEART REMOTE DEVICE CHECK
Battery Remaining Longevity: 112 mo
Battery Voltage: 3.01 V
Brady Statistic RV Percent Paced: 0.01 %
Date Time Interrogation Session: 20240405033522
HighPow Impedance: 66 Ohm
Implantable Lead Connection Status: 753985
Implantable Lead Implant Date: 20210105
Implantable Lead Location: 753860
Implantable Pulse Generator Implant Date: 20210105
Lead Channel Impedance Value: 342 Ohm
Lead Channel Impedance Value: 437 Ohm
Lead Channel Pacing Threshold Amplitude: 0.5 V
Lead Channel Pacing Threshold Pulse Width: 0.4 ms
Lead Channel Sensing Intrinsic Amplitude: 9.75 mV
Lead Channel Sensing Intrinsic Amplitude: 9.75 mV
Lead Channel Setting Pacing Amplitude: 2.5 V
Lead Channel Setting Pacing Pulse Width: 0.4 ms
Lead Channel Setting Sensing Sensitivity: 0.3 mV
Zone Setting Status: 755011
Zone Setting Status: 755011

## 2022-04-10 ENCOUNTER — Ambulatory Visit: Payer: Medicare Other | Admitting: Pharmacist

## 2022-04-10 NOTE — Progress Notes (Signed)
Care Management & Coordination Services Pharmacy Note  04/10/2022 Name:  [name] MRN:  [id number] DOB:  25-Jul-1945  Summary: F/U visit -DM: A1c 9.3% (03/2022) above goal; pt reports compliance with Tresiba 80 units daily; he is taking Novolog 20 units at bedtime; per CGM glucose control has improved compared to last week Reviewed AGP report: 03/28/22 to 04/10/22. Sensor active: 97%             Time in range (70-180): 25% (improved from 16%)             High (180-250): 23%             Very high (>250): 51%             Low (< 70): 1% (goal < 4%)             GMI: 9.3% (improved from 10.2%); Average glucose: 250 (improved from 286)  Recommendations/Changes made from today's visit: -Advised to take Novolog 20 units before meals, not at bedtime  Follow up plan: -Pharmacist follow up televisit scheduled for 1 week -Cardiology appt 05/01/22; PCP appt 06/29/22    Subjective: [name] is an 77 y.o. year old male who is a primary patient of Doreene Nest, NP.  The care coordination team was consulted for assistance with disease management and care coordination needs.    Engaged with patient by telephone for follow up visit.  Recent office visits: 03/29/22 NP Mayra Reel OV: f/u - A1c 9.3%; increase Ozempic to 1 mg; declined nutrition and endo referral, pt declined.  Recent consult visits: ECHO 02/13/22: EF 25-30%. Stop lisinopril, start Entresto 3 days later.  01/31/22 PA Keitha Butte (Cardiology): f/u - persistent afib, 100% burden. Consider amiodarone or Tikosyn - order Echo. Schedule DCCV. 2/14.  Hospital visits: 02/21/22 planned admission - DCCV.   Objective:  Lab Results  Component Value Date   CREATININE 1.13 01/31/2022   BUN 15 01/31/2022   GFR 53.87 (L) 11/30/2021   EGFR 67 01/31/2022   GFRNONAA >60 01/07/2019   GFRAA >60 01/07/2019   NA 135 01/31/2022   K 4.3 01/31/2022   CALCIUM 9.3 01/31/2022   CO2 20 01/31/2022   GLUCOSE 324 (H) 01/31/2022    Lab Results   Component Value Date/Time   HGBA1C 9.3 (A) 03/29/2022 07:41 AM   HGBA1C 8.1 (A) 12/22/2021 10:38 AM   HGBA1C 9.2 (H) 01/06/2019 01:51 AM   HGBA1C 9.5 (H) 09/13/2018 09:04 AM   GFR 53.87 (L) 11/30/2021 03:17 PM   GFR 75.44 12/07/2020 08:35 AM   MICROALBUR 1.6 12/22/2021 11:10 AM   MICROALBUR 1.6 11/04/2015 08:26 AM    Last diabetic Eye exam:  Lab Results  Component Value Date/Time   HMDIABEYEEXA Retinopathy (A) 11/26/2019 12:00 AM    Last diabetic Foot exam: No results found for: "HMDIABFOOTEX"   Lab Results  Component Value Date   CHOL 176 05/04/2021   HDL 44 05/04/2021   LDLCALC 125 (H) 05/04/2021   TRIG 35 05/04/2021   CHOLHDL 4.0 05/04/2021       Latest Ref Rng & Units 03/30/2022    8:46 AM 05/04/2021    9:12 AM 12/07/2020    8:35 AM  Hepatic Function  Total Protein 6.0 - 8.3 g/dL 7.1  7.0  7.3   Albumin 3.5 - 5.2 g/dL 4.2  4.4  4.1   AST 0 - 37 U/L 22  33  48   ALT 0 - 53 U/L 32  42  114  Alk Phosphatase 39 - 117 U/L 70  107  114   Total Bilirubin 0.2 - 1.2 mg/dL 0.7  1.0  3.1   Bilirubin, Direct 0.0 - 0.3 mg/dL 0.1       Lab Results  Component Value Date/Time   TSH 1.75 03/30/2022 08:46 AM   TSH 1.529 01/06/2019 10:40 AM   TSH 0.748 06/18/2014 04:57 AM       Latest Ref Rng & Units 01/31/2022   11:49 AM 11/30/2021    3:17 PM 05/04/2021    9:12 AM  CBC  WBC 3.4 - 10.8 x10E3/uL 7.6  13.6  7.0   Hemoglobin 13.0 - 17.7 g/dL 87.5  64.3  32.9   Hematocrit 37.5 - 51.0 % 46.4  52.5  42.1   Platelets 150 - 450 x10E3/uL 242  259.0  193     No results found for: "VD25OH", "VITAMINB12"  Clinical ASCVD: Yes  The ASCVD Risk score (Arnett DK, et al., 2019) failed to calculate for the following reasons:   The patient has a prior MI or stroke diagnosis    CHA2DS2/VAS Stroke Risk Points  Current as of 30 minutes ago     6 >= 2 Points: High Risk    Points Metrics  1 Has Congestive Heart Failure:  Yes    Current as of 30 minutes ago  1 Has Vascular Disease:  Yes     Current as of 30 minutes ago  1 Has Hypertension:  Yes    Current as of 30 minutes ago  2 Age:  77    Current as of 30 minutes ago  1 Has Diabetes:  Yes    Current as of 30 minutes ago  0 Had Stroke:  No  Had TIA:  No  Had Thromboembolism:  No    Current as of 30 minutes ago  0 Male:  No    Current as of 30 minutes ago        03/29/2022    7:26 AM 01/11/2022    3:22 PM 01/07/2021    3:34 PM  Depression screen PHQ 2/9  Decreased Interest 0 0 0  Down, Depressed, Hopeless 0 0 0  PHQ - 2 Score 0 0 0     Social History   Tobacco Use  Smoking Status Never  Smokeless Tobacco Never   BP Readings from Last 3 Encounters:  03/29/22 118/62  02/21/22 (!) 114/101  02/15/22 138/79   Pulse Readings from Last 3 Encounters:  03/29/22 60  02/21/22 (!) 103  02/15/22 94   Wt Readings from Last 3 Encounters:  03/29/22 199 lb (90.3 kg)  02/21/22 190 lb 0.6 oz (86.2 kg)  02/15/22 190 lb (86.2 kg)   BMI Readings from Last 3 Encounters:  03/29/22 30.27 kg/m  02/21/22 28.90 kg/m  02/15/22 28.89 kg/m    Allergies  Allergen Reactions   Lipitor [Atorvastatin] Other (See Comments)    Myopathy Spring 2006   Pravastatin Other (See Comments)    LEG CRAMPS   Simvastatin Other (See Comments)    LEG CRAMPS    Medications Reviewed Today     Reviewed by Kathyrn Sheriff, [facility] (Pharmacist) on 04/10/22 at 1559  Med List Status: <None>   Medication Order Taking? Sig Documenting Provider Last Dose Status Informant  albuterol (VENTOLIN HFA) 108 (90 Base) MCG/ACT inhaler [id number] Yes Inhale 2 puffs into the lungs every 6 (six) hours as needed for wheezing or shortness of breath. Hannah Beat, MD Taking Active Self  carvedilol (COREG) 6.25 MG tablet [id number] Yes TAKE 1 TABLET BY MOUTH TWICE A DAY WITH MEAL Corky CraftsVaranasi, Jayadeep S, MD Taking Active Self  Continuous Blood Gluc Receiver (FREESTYLE LIBRE 2 READER) DEVI [id number] Yes Use to check blood sugars. Doreene Nestlark, Katherine K, NP Taking  Active Self  Continuous Blood Gluc Sensor (FREESTYLE LIBRE 3 SENSOR) [place] [id number] Yes Place 1 sensor on the skin every 14 days. Use to check glucose continuously Doreene Nestlark, Katherine K, NP Taking Active Self  dapagliflozin propanediol (FARXIGA) 10 MG TABS tablet [id number] Yes Take 1 tablet (10 mg total) by mouth daily before breakfast. For diabetes. Doreene Nestlark, Katherine K, NP Taking Active Self  ELIQUIS 5 MG TABS tablet [id number] Yes TAKE 1 TABLET BY MOUTH TWICE A DAY Corky CraftsVaranasi, Jayadeep S, MD Taking Active Self  insulin aspart (NOVOLOG FLEXPEN) 100 UNIT/ML FlexPen [id number] Yes Inject 20 Units into the skin 3 (three) times daily with meals. [provider] Taking Active            Med Note Marco Collie(Junita Kubota N   Mon Apr 10, 2022  3:59 PM)    insulin degludec (TRESIBA FLEXTOUCH) 200 UNIT/ML FlexTouch Pen [id number] Yes Inject 100 Units into the skin daily. Via [employer] Cares PAP  Patient taking differently: Inject 80 Units into the skin daily. Via Novo Cares PAP   Doreene Nestlark, Katherine K, NP Taking Active Self  Insulin Pen Needle 31G X 8 MM MISC [id number] Yes Use as directed with Lantus. Doreene Nestlark, Katherine K, NP Taking Active Self  isosorbide mononitrate (IMDUR) 30 MG 24 hr tablet [id number] Yes TAKE 1 TABLET BY MOUTH EVERY DAY Corky CraftsVaranasi, Jayadeep S, MD Taking Active Self  nitroGLYCERIN (NITROSTAT) 0.4 MG SL tablet [id number] Yes Place 1 tablet (0.4 mg total) under the tongue every 5 (five) minutes as needed for chest pain. Corky CraftsVaranasi, Jayadeep S, MD Taking Active Self  ondansetron (ZOFRAN-ODT) 4 MG disintegrating tablet [id number] Yes Take 1 tablet (4 mg total) by mouth every 8 (eight) hours as needed for nausea or vomiting. Doreene Nestlark, Katherine K, NP Taking Active Self  OVER THE COUNTER MEDICATION [id number] Yes Place 1 drop into both eyes 2 (two) times daily. Happy body  liquid msm eye drops/ for floaters in the eyes [provider] Taking Active Self  sacubitril-valsartan (ENTRESTO) 24-26 MG [id number] Yes  Take 1 tablet by mouth 2 (two) times daily. Sheilah PigeonUrsuy, Renee Lynn, PA-C Taking Active   Semaglutide, 1 MG/DOSE, (OZEMPIC, 1 MG/DOSE,) 4 MG/3ML SOPN [id number] Yes Inject 1 mg into the skin once a week. Via [employer] Cares PAP [provider] Taking Active Self           Med Note Kimber Relic(HORTON, JEANNETTA   Mon Feb 13, 2022  1:01 PM)    tamsulosin (FLOMAX) 0.4 MG CAPS capsule [id number] Yes TAKE 1 CAPSULE (0.4 MG TOTAL) BY MOUTH DAILY. FOR URINE FLOW. Doreene Nestlark, Katherine K, NP Taking Active             SDOH:  (Social Determinants of Health) assessments and interventions performed: No SDOH Interventions    Flowsheet Row Clinical Support from [facility]20[facility] Clinical Support from 09/13/2018 in [facility] Clinical Support from 08/24/2016 [facility]  SDOH Interventions      Food Insecurity Interventions Intervention Not Indicated -- -- --  Housing Interventions Intervention Not Indicated -- -- --  Transportation  Interventions Intervention Not Indicated -- -- --  Utilities Interventions Intervention Not Indicated -- -- --  Depression Interventions/Treatment  -- -- URK2-7 Score <4 Follow-up Not Indicated --  [pt is being referred to PCP for further evaluation]  Financial Strain Interventions Intervention Not Indicated Other (Comment)  [Farxiga - PAP,  Eliquis - Pt to let us know when he goes into coverage gap in 2023.] -- --  Physical Activity Interventions Intervention Not Indicated -- -- --  Stress Interventions Intervention Not Indicated -- -- --  Social Connections Interventions Intervention Not Indicated -- -- --       Medication Assistance:  Novo Cares - Ozempic (approved through 01/02/23) Novo Cares - [place], Novolog approved 2024 [place] - [name] (approved through 01/02/23)  Medication  Access: Within the past 30 days, how often has patient missed a dose of medication? 0 Is a pillbox or other method used to improve adherence? Yes  Factors that may affect medication adherence? lack of understanding of disease management Are meds synced by current pharmacy? No  Are meds delivered by current pharmacy? No  Does patient experience delays in picking up medications due to transportation concerns? No   Upstream Services Reviewed: Is patient disadvantaged to use [facility]?: No  Current Rx insurance plan: [facility] Name and location of Current pharmacy:  [facility] [phone] - [place],  - [address] [place] [address] Phone: [phone] Fax: [phone]  [facility] services reviewed with patient today?: No  Patient requests to transfer care to [facility]?: No  Reason patient declined to change pharmacies: Not mentioned at this visit  Compliance/Adherence/Medication fill history: Care Gaps: Eye exam (due 11/2020)  Star-Rating Drugs: Farxiga -PAP Ozempic - PAP   Assessment/Plan  Hypertension / Heart Failure (BP goal <130/80) -Controlled - per clinic readings -Last ejection fraction: 25-30% (Date: 02/2022) -HF type: HFrEF (EF < 40%); NYHA Class: I (no actitivty limitation) -Current treatment: Carvedilol 6.25 mg BID - Appropriate, Effective, Safe, Accessible Isosorbide Mononitrate 30 mg daily - Appropriate, Effective, Safe, Accessible Entresto 24-26 mg BID -Appropriate, Effective, Safe, Accessible Farxiga 10 mg daily -Appropriate, Effective, Safe, Accessible -Medications previously tried: none  -Current home readings: none reported -Denies hypotensive/hypertensive symptoms; Denies chest pain, SOB, dizziness, falls. -Educated on importance of daily adherence; reviewed refill history -Counseled to monitor BP at home if symptomatic, document, and provide log at future appointments -Recommended to continue current  medication  Hyperlipidemia: (LDL goal < 70) -Not ideally controlled - LDL 125 (05/2021) off of cholesterol meds -Hx CAD (CABG 2016); follows with cardiology (Dr Eldridge Dace); he has discussed PSCK9-inhibitors with cardiology pharmacist and deferred due to multiple injections -No aspirin due to Eliquis -Current treatment: Nitroglycerin 0.4 mg SL PRN -Medications previously tried: pravastatin 20 mg, rosuvastatin 10 mg TIW, simvastatin 10 mg TIW, atorvastatin, ezetimibe -Educated on Cholesterol goals;  -Consider Leqvio   Diabetes (A1c goal <7%) -Uncontrolled - A1c 9.3% (3/24), pt had apparently confused [place] and Novolog; he had been taking Novolog 100 units BID, and he has not been taking [place]; last week this was corrected to [place] 80 units daily and Novolog 20 units with meals, he reports compliance with Evaristo Bury but has been taking Novolog 20 units at bedtime. -Current home glucose readings: Freestyle Libre 2 > upgraded to [place] 05 March 2022 Reviewed AGP report: 03/28/22 to 04/10/22. Sensor active: 97%  Time in range (70-180): 25% (goal > 70%)  High (180-250): 23%  Very high (>250): 51%  Low (< 70): 1% (goal < 4%)  GMI: 9.3%; Average glucose: 250   -Previous AGP report: 03/21/22 to 04/03/22. Sensor active: 98%  Time in range (70-180): 16% (goal > 70%)  High (180-250): 14%  Very high (>250): 69%  Low (< 70): 1% (goal < 4%)  GMI: 10.2%; Average glucose: 286   -Current medications: Farxiga 10 mg daily (PAP) - Appropriate, Query Effective Insulin Degludec U200 - 80 units daily AM (PAP) -  Novolog 20 units PM w/ meals  Ozempic 1 mg weekly (PAP) -Appropriate, Query Effective Freestyle Libre 3 (phone) - Appropriate, Effective, Safe, Accessible -Medications previously tried: glipizide (hypoglycemia) -Reviewed CGM report - improvement since last week especially in last few days -Reviewed Novolog again as "mealtime" insulin, advised he take 20 units before meals (not at bedtime) -Recommend  to continue current medication  Atrial Fibrillation (Goal: prevent stroke and major bleeding) -Followed by cardiology -CHADSVASC: 6 -Reviewed adherence: Eliquis PDC 53% less than ideal - pt rations Eliquis during the donut hole (Oct-Dec typically) -Current treatment: Carvedilol 6.25 mg BID - Appropriate, Effective, Safe, Accessible Eliquis 5 mg BID -Appropriate, Effective, Safe, Accessible -Medications previously tried: none reported -Home BP and HR readings: none reported -Counseled on increased risk of stroke due to Afib and benefits of anticoagulation for stroke prevention; importance of adherence to anticoagulant exactly as prescribed; avoidance of NSAIDs due to increased bleeding risk with anticoagulants; -Recommended to continue current medication  Health Maintenance -BPH: on tamsulosin   Al Corpus, PharmD, Patsy [place], CPP Clinical Pharmacist Practitioner [facility] at [facility] [phone]

## 2022-04-10 NOTE — Patient Instructions (Signed)
Visit Information  Phone number for Pharmacist: [phone]  Thank you for meeting with me to discuss your medications! Below is a summary of what we talked about during the visit:   Recommendations/Changes made from today's visit: -Advised to take Novolog 20 units before meals, not at bedtime  Follow up plan: -Pharmacist follow up televisit scheduled for 1 week -Cardiology appt 05/01/22; PCP appt 06/29/22   Al Corpus, PharmD, BCACP Clinical Pharmacist [facility] [phone]

## 2022-04-10 NOTE — Telephone Encounter (Signed)
Thank you for the update, Mardella Layman! I appreciate your hard work with this patient.

## 2022-04-10 NOTE — Progress Notes (Unsigned)
  Electrophysiology Office Follow up Visit Note:    Date:  04/11/2022   ID:  [name], DOB 1945-01-22, MRN [id number]  PCP:  Doreene Nest, NP  [facility] Cardiologist:  Lance Muss, MD  [facility] Electrophysiologist:  Lanier Prude, MD    Interval History:    [name] is a 77 y.o. male who presents for a follow up visit.   Previously followed by Dr Johney Frame. Saw Renee 01/31/2022. Hx of CAD s/p CABG and PCI, HTN, HLD, DM, AF s/p MAZE and LAAC during CABG, HFrEF s/p ICD.  I reviewed a recent device interrogation and saw his AF burden had increased to 95.9%. I had him see Renee who scheduled a cardioversion. He takes Eliquis.  He had a DCCV 02/21/2022.  Today, he reports feeling much better after his cardioversion.  Improved energy levels and breathing.  He is active and plays golf at least 3 times per week.     Past medical, surgical, social and family history were reviewed.  ROS:   Please see the history of present illness.    All other systems reviewed and are negative.  EKGs/Labs/Other Studies Reviewed:    The following studies were reviewed today:  04/11/2022 in clinic device interrogation personally reviewed Battery longevity 9 years, ventricular sensing 100% Lead parameter stable No A-fib since cardioversion No high-voltage therapies Elevated after cardioversion but no clinical signs of heart failure   EKG today shows sinus rhythm.    Physical Exam:    VS:  BP 120/64   Pulse 76   Ht 5\' 8"  (1.727 m)   Wt 202 lb 9.6 oz (91.9 kg)   SpO2 98%   BMI 30.81 kg/m     Wt Readings from Last 3 Encounters:  04/11/22 202 lb 9.6 oz (91.9 kg)  03/29/22 199 lb (90.3 kg)  02/21/22 190 lb 0.6 oz (86.2 kg)     GEN:  Well nourished, well developed in no acute distress CARDIAC: RRR, no murmurs, rubs, gallops.  Prepectoral pocket well-healed RESPIRATORY:  Clear to auscultation without rales, wheezing or rhonchi       ASSESSMENT:    1.  Persistent atrial fibrillation   2. Chronic systolic congestive heart failure   3. ICD (implantable cardioverter-defibrillator) in place    PLAN:    In order of problems listed above:  #Persistent AF Symptomatic. Associated with systolic heart failure.  Given his medical history, tikosyn and amiodarone are the only antiarrhythmic options. I do not think ablation is a reasonable choice given comorbidities, severe CAD and history of MAZE.   For now, continue to monitor his rhythm.  If he were to have a recurrence of atrial fibrillation, favor admission for Tikosyn.  This was discussed in detail with the patient.  #Chronic systolic heart failure #ICD in situ NYHA II. Warm and dry. Rhythm control indicated. Continue remote monitoring.   Follow-up in 6 months or sooner as needed.   Signed, Steffanie Dunn, MD, [facility], [facility] 04/11/2022 1:15 PM    Electrophysiology [facility]

## 2022-04-10 NOTE — Telephone Encounter (Addendum)
Patient updated me after fixing insulin regimen 4/1: Tresiba 80 units daily and Novolog 20 units w/ meals.  Patient reports compliance with Tresiba 80 units daily (AM). He is taking Novolog 20 units at bedtime. Discussed with patient to take Novolog before meals, not at bedtime. Patient also reports compliance with Farxiga 10 mg daily and Ozempic 1 mg weekly (still awaiting 2 mg dose from PAP)  Per CGM report (below), glucose control has improved compared to last week. Particularly in the last few days (4/4-4/8) time-in-target-range is improved per daily graphics. He is still having glucose spikes after meals, likely due to not taking Novolog correctly. Hopefully improved Novolog compliance prior to meals will help with this.  Plan: Follow up call 1 week with PharmD.  Reviewed AGP report: 03/28/22 to 04/10/22. Sensor active: 97%  Time in range (70-180): 25% (improved from 16%)  High (180-250): 23%  Very high (>250): 51%  Low (< 70): 1% (goal < 4%)  GMI: 9.3% (improved from 10.2%); Average glucose: 250 (improved from 286)

## 2022-04-11 ENCOUNTER — Ambulatory Visit: Payer: Medicare Other | Attending: Cardiology | Admitting: Cardiology

## 2022-04-11 ENCOUNTER — Telehealth: Payer: Self-pay

## 2022-04-11 ENCOUNTER — Encounter: Payer: Self-pay | Admitting: Cardiology

## 2022-04-11 VITALS — BP 120/64 | HR 76 | Ht 68.0 in | Wt 202.6 lb

## 2022-04-11 DIAGNOSIS — I4819 Other persistent atrial fibrillation: Secondary | ICD-10-CM

## 2022-04-11 DIAGNOSIS — Z95810 Presence of automatic (implantable) cardiac defibrillator: Secondary | ICD-10-CM | POA: Diagnosis not present

## 2022-04-11 DIAGNOSIS — I5022 Chronic systolic (congestive) heart failure: Secondary | ICD-10-CM

## 2022-04-11 NOTE — Patient Instructions (Signed)
Medication Instructions:  Your physician recommends that you continue on your current medications as directed. Please refer to the Current Medication list given to you today.  *If you need a refill on your cardiac medications before your next appointment, please call your pharmacy*  Follow-Up: At [facility], you and your health needs are our priority.  As part of our continuing mission to provide you with exceptional heart care, we have created designated Provider Care Teams.  These Care Teams include your primary Cardiologist (physician) and Advanced Practice Providers (APPs -  Physician Assistants and Nurse Practitioners) who all work together to provide you with the care you need, when you need it.  Your next appointment:   6 month(s)  Provider:   You will see one of the following Advanced Practice Providers on your designated Care Team:   Renee Ursuy, PA-C Michael "Andy" Tillery, PA-C Suzann Riddle, NP   

## 2022-04-11 NOTE — Progress Notes (Unsigned)
Care Management & Coordination Services Pharmacy Team  Reason for Encounter: Appointment Reminder  Contacted patient to confirm telephone appointment with Al Corpus, PharmD on 04/17/2022 at 3:00.  {US HC Outreach:28874}  Do you have any problems getting your medications? {yes/no:20286} If yes what types of problems are you experiencing? {Problems:27223}  What is your top health concern you would like to discuss at your upcoming visit?   Have you seen any other providers since your last visit with PCP? No  Star Rating Drugs:  Medication:    Last Fill: Day Supply Sacubitril-Valsartan 24-26 mg 03/29/2022 30 Ozempic 1 mg    PAP Tresiba  100 Units   PAP Farxiga 10 mg    PAP  Care Gaps: Annual wellness visit in last year? Yes 01/11/2022  If Diabetic: Last eye exam / retinopathy screening: Overdue Last diabetic foot exam: Up to date  Al Corpus, PharmD notified  Claudina Lick, [place] Clinical Pharmacy Assistant [phone]

## 2022-04-14 ENCOUNTER — Telehealth: Payer: Self-pay

## 2022-04-14 NOTE — Telephone Encounter (Signed)
Received patient assistance medication for Ozempic. 4 boxes of ozempic are placed in fridge. Patient has been notified.

## 2022-04-17 ENCOUNTER — Ambulatory Visit: Payer: Medicare Other | Admitting: Pharmacist

## 2022-04-17 NOTE — Patient Instructions (Signed)
Visit Information  Phone number for Pharmacist: [phone]  Thank you for meeting with me to discuss your medications! Below is a summary of what we talked about during the visit:   Recommendations/Changes made from today's visit: -Advised to take Novolog 20 units before before breakfast and dinner -Advised to increase Ozempic to 2 mg starting next week -Advised pt to contact office with any low glucose < 70  Follow up plan: -Pharmacist follow up televisit scheduled for 1 week -Cardiology appt 05/01/22; PCP appt 06/29/22   Al Corpus, PharmD, BCACP Clinical Pharmacist [facility] at [facility] [phone]

## 2022-04-17 NOTE — Progress Notes (Signed)
Care Management & Coordination Services Pharmacy Note  04/17/2022 Name:  [name] MRN:  [id number] DOB:  02/12/1945  Summary: F/U visit -DM - A1c 9.3% (03/2022), pt is now taking Tresiba 80 units daily and Novolog 20 units with dinner only (not with other meals). He is taking Ozempic 1 mg on Sundays. He will start Ozempic 2 mg next week (office received shipment from PAP) Reviewed AGP report: 04/04/22 to 04/17/22. Sensor active: 95%  Time in range (70-180): 32% (improved from 25%)  High (180-250): 28%  Very high (>250): 40% (improved from 51%)  Low (< 70): 0% (goal < 4%)  GMI: 8.7% (improved from 9.3%); Average glucose: 227 (improved from 250)  Recommendations/Changes made from today's visit: -Advised to take Novolog 20 units before before breakfast and dinner -Advised to increase Ozempic to 2 mg starting next week (4/21) -Advised pt to contact office with any low glucose < 70  Follow up plan: -Pharmacist follow up televisit scheduled for 1 week -Cardiology appt 05/01/22; PCP appt 06/29/22    Subjective: [name] is an 77 y.o. year old male who is a primary patient of Doreene Nest, NP.  The care coordination team was consulted for assistance with disease management and care coordination needs.    Engaged with patient by telephone for follow up visit.  Recent office visits: 03/29/22 NP Mayra Reel OV: f/u - A1c 9.3%; increase Ozempic to 1 mg; declined nutrition and endo referral, pt declined.  Recent consult visits: 04/11/22 Dr Lalla Brothers (Cardiology): AF - monitor for now. If recurrence, favor Tikosyn over amiodarone.   ECHO 02/13/22: EF 25-30%. Stop lisinopril, start Entresto 3 days later.  01/31/22 PA Keitha Butte (Cardiology): f/u - persistent afib, 100% burden. Consider amiodarone or Tikosyn - order Echo. Schedule DCCV. 2/14.  Hospital visits: 02/21/22 planned admission - DCCV.   Objective:  Lab Results  Component Value Date   CREATININE 1.13 01/31/2022   BUN 15  01/31/2022   GFR 53.87 (L) 11/30/2021   EGFR 67 01/31/2022   GFRNONAA >60 01/07/2019   GFRAA >60 01/07/2019   NA 135 01/31/2022   K 4.3 01/31/2022   CALCIUM 9.3 01/31/2022   CO2 20 01/31/2022   GLUCOSE 324 (H) 01/31/2022    Lab Results  Component Value Date/Time   HGBA1C 9.3 (A) 03/29/2022 07:41 AM   HGBA1C 8.1 (A) 12/22/2021 10:38 AM   HGBA1C 9.2 (H) 01/06/2019 01:51 AM   HGBA1C 9.5 (H) 09/13/2018 09:04 AM   GFR 53.87 (L) 11/30/2021 03:17 PM   GFR 75.44 12/07/2020 08:35 AM   MICROALBUR 1.6 12/22/2021 11:10 AM   MICROALBUR 1.6 11/04/2015 08:26 AM    Last diabetic Eye exam:  Lab Results  Component Value Date/Time   HMDIABEYEEXA Retinopathy (A) 11/26/2019 12:00 AM    Last diabetic Foot exam: No results found for: "HMDIABFOOTEX"   Lab Results  Component Value Date   CHOL 176 05/04/2021   HDL 44 05/04/2021   LDLCALC 125 (H) 05/04/2021   TRIG 35 05/04/2021   CHOLHDL 4.0 05/04/2021       Latest Ref Rng & Units 03/30/2022    8:46 AM 05/04/2021    9:12 AM 12/07/2020    8:35 AM  Hepatic Function  Total Protein 6.0 - 8.3 g/dL 7.1  7.0  7.3   Albumin 3.5 - 5.2 g/dL 4.2  4.4  4.1   AST 0 - 37 U/L 22  33  48   ALT 0 - 53 U/L 32  42  114  Alk Phosphatase 39 - 117 U/L 70  107  114   Total Bilirubin 0.2 - 1.2 mg/dL 0.7  1.0  3.1   Bilirubin, Direct 0.0 - 0.3 mg/dL 0.1       Lab Results  Component Value Date/Time   TSH 1.75 03/30/2022 08:46 AM   TSH 1.529 01/06/2019 10:40 AM   TSH 0.748 06/18/2014 04:57 AM       Latest Ref Rng & Units 01/31/2022   11:49 AM 11/30/2021    3:17 PM 05/04/2021    9:12 AM  CBC  WBC 3.4 - 10.8 x10E3/uL 7.6  13.6  7.0   Hemoglobin 13.0 - 17.7 g/dL 40.9  81.1  91.4   Hematocrit 37.5 - 51.0 % 46.4  52.5  42.1   Platelets 150 - 450 x10E3/uL 242  259.0  193     No results found for: "VD25OH", "VITAMINB12"  Clinical ASCVD: Yes  The ASCVD Risk score (Arnett DK, et al., 2019) failed to calculate for the following reasons:   The patient has a  prior MI or stroke diagnosis    CHA2DS2/VAS Stroke Risk Points  Current as of 30 minutes ago     6 >= 2 Points: High Risk    Points Metrics  1 Has Congestive Heart Failure:  Yes    Current as of 30 minutes ago  1 Has Vascular Disease:  Yes    Current as of 30 minutes ago  1 Has Hypertension:  Yes    Current as of 30 minutes ago  2 Age:  35    Current as of 30 minutes ago  1 Has Diabetes:  Yes    Current as of 30 minutes ago  0 Had Stroke:  No  Had TIA:  No  Had Thromboembolism:  No    Current as of 30 minutes ago  0 Male:  No    Current as of 30 minutes ago        03/29/2022    7:26 AM 01/11/2022    3:22 PM 01/07/2021    3:34 PM  Depression screen PHQ 2/9  Decreased Interest 0 0 0  Down, Depressed, Hopeless 0 0 0  PHQ - 2 Score 0 0 0     Social History   Tobacco Use  Smoking Status Never  Smokeless Tobacco Never   BP Readings from Last 3 Encounters:  04/11/22 120/64  03/29/22 118/62  02/21/22 (!) 114/101   Pulse Readings from Last 3 Encounters:  04/11/22 76  03/29/22 60  02/21/22 (!) 103   Wt Readings from Last 3 Encounters:  04/11/22 202 lb 9.6 oz (91.9 kg)  03/29/22 199 lb (90.3 kg)  02/21/22 190 lb 0.6 oz (86.2 kg)   BMI Readings from Last 3 Encounters:  04/11/22 30.81 kg/m  03/29/22 30.27 kg/m  02/21/22 28.90 kg/m    Allergies  Allergen Reactions   Lipitor [Atorvastatin] Other (See Comments)    Myopathy Spring 2006   Pravastatin Other (See Comments)    LEG CRAMPS   Simvastatin Other (See Comments)    LEG CRAMPS    Medications Reviewed Today     Reviewed by Kathyrn Sheriff, [facility] (Pharmacist) on 04/17/22 at 1514  Med List Status: <None>   Medication Order Taking? Sig Documenting Provider Last Dose Status Informant  albuterol (VENTOLIN HFA) 108 (90 Base) MCG/ACT inhaler [id number] Yes Inhale 2 puffs into the lungs every 6 (six) hours as needed for wheezing or shortness of breath. Hannah Beat, MD Taking Active  Self  carvedilol  (COREG) 6.25 MG tablet [id number] Yes TAKE 1 TABLET BY MOUTH TWICE A DAY WITH MEAL Corky Crafts, MD Taking Active Self  Continuous Blood Gluc Sensor (FREESTYLE LIBRE 3 SENSOR) [place] [id number] Yes Place 1 sensor on the skin every 14 days. Use to check glucose continuously Doreene Nest, NP Taking Active Self  dapagliflozin propanediol (FARXIGA) 10 MG TABS tablet [id number] Yes Take 1 tablet (10 mg total) by mouth daily before breakfast. For diabetes. Doreene Nest, NP Taking Active Self           Med Note Marco Collie Apr 17, 2022  3:13 PM) [place] PAP 2024  ELIQUIS 5 MG TABS tablet [id number] Yes TAKE 1 TABLET BY MOUTH TWICE A DAY Corky Crafts, MD Taking Active Self  insulin aspart (NOVOLOG FLEXPEN) 100 UNIT/ML FlexPen [id number] Yes Inject 20 Units into the skin 3 (three) times daily with meals. [provider] Taking Active            Med Note Marco Collie Apr 17, 2022  3:13 PM) Novo Cares PAP  insulin degludec (TRESIBA FLEXTOUCH) 200 UNIT/ML FlexTouch Pen [id number] Yes Inject 100 Units into the skin daily. Via [employer] PAP  Patient taking differently: Inject 80 Units into the skin daily. Via Novo Cares PAP   Doreene Nest, NP Taking Active Self           Med Note Marco Collie Apr 17, 2022  3:13 PM) Novo Cares PAP  Insulin Pen Needle 31G X 8 MM MISC [id number] Yes Use as directed with Lantus. Doreene Nest, NP Taking Active Self  isosorbide mononitrate (IMDUR) 30 MG 24 hr tablet [id number] Yes TAKE 1 TABLET BY MOUTH EVERY DAY Corky Crafts, MD Taking Active Self  nitroGLYCERIN (NITROSTAT) 0.4 MG SL tablet [id number] Yes Place 1 tablet (0.4 mg total) under the tongue every 5 (five) minutes as needed for chest pain. Corky Crafts, MD Taking Active Self  ondansetron (ZOFRAN-ODT) 4 MG disintegrating tablet [id number] Yes Take 1 tablet (4 mg total) by mouth every 8 (eight) hours as needed for nausea or  vomiting. Doreene Nest, NP Taking Active Self  OVER THE COUNTER MEDICATION [id number] Yes Place 1 drop into both eyes 2 (two) times daily. Happy body  liquid msm eye drops/ for floaters in the eyes [provider] Taking Active Self  sacubitril-valsartan (ENTRESTO) 24-26 MG [id number] Yes Take 1 tablet by mouth 2 (two) times daily. Sheilah Pigeon, PA-C Taking Active   Semaglutide, 2 MG/DOSE, (OZEMPIC, 2 MG/DOSE,) 8 MG/3ML SOPN [id number] Yes Inject 2 mg into the skin once a week. [provider]  Active            Med Note Marco Collie Apr 17, 2022  3:14 PM) Novo Cares PAP  tamsulosin (FLOMAX) 0.4 MG CAPS capsule [id number] Yes TAKE 1 CAPSULE (0.4 MG TOTAL) BY MOUTH DAILY. FOR URINE FLOW. Doreene Nest, NP Taking Active             SDOH:  (Social Determinants of Health) assessments and interventions performed: No SDOH Interventions    Flowsheet Row Clinical Support from 01/11/2022 in [facility] at [facility] Chronic Care Management from 12/21/2020 in [facility] at [facility] Clinical Support from 09/13/2018 in [facility] at [facility] Clinical Support from 08/24/2016 in [facility]  at [facility]  SDOH Interventions      Food Insecurity Interventions Intervention Not Indicated -- -- --  Housing Interventions Intervention Not Indicated -- -- --  Transportation Interventions Intervention Not Indicated -- -- --  Utilities Interventions Intervention Not Indicated -- -- --  Depression Interventions/Treatment  -- -- UXN2-3 Score <4 Follow-up Not Indicated --  [pt is being referred to PCP for further evaluation]  Financial Strain Interventions Intervention Not Indicated Other (Comment)  [Farxiga - PAP,  Eliquis - Pt to let us know when he goes into coverage gap in 2023.] -- --  Physical Activity Interventions Intervention Not Indicated -- -- --  Stress Interventions  Intervention Not Indicated -- -- --  Social Connections Interventions Intervention Not Indicated -- -- --       Medication Assistance:  Novo Cares - Ozempic (approved through 01/02/23) Novo Cares - [place], Novolog approved 2024 AZ&Me - [name] (approved through 01/02/23)  Medication Access: Within the past 30 days, how often has patient missed a dose of medication? 0 Is a pillbox or other method used to improve adherence? Yes  Factors that may affect medication adherence? lack of understanding of disease management Are meds synced by current pharmacy? No  Are meds delivered by current pharmacy? No  Does patient experience delays in picking up medications due to transportation concerns? No   Upstream Services Reviewed: Is patient disadvantaged to use [facility]?: No  Current Rx insurance plan: [facility] Name and location of Current pharmacy:  [facility] [phone] - [place], [place] - [address] [place] [address] Phone: [phone] Fax: [phone]  [facility] Pharm[facility] services reviewed with patient today?: No  Patient requests to transfer care to [facility] Pharm[facility]?: No  Reason patient declined to change pharmacies: Not mentioned at this visit  Compliance/Adherence/Medication fill history: Care Gaps: Eye exam (due 11/2020)  Star-Rating Drugs: Farxiga -PAP Ozempic - PAP   Assessment/Plan  Hypertension / Heart Failure (BP goal <130/80) -Controlled - per clinic readings -Last ejection fraction: 25-30% (Date: 02/2022) -HF type: HFrEF (EF < 40%); NYHA Class: I (no actitivty limitation) -Current treatment: Carvedilol 6.25 mg BID - Appropriate, Effective, Safe, Accessible Isosorbide Mononitrate 30 mg daily - Appropriate, Effective, Safe, Accessible Entresto 24-26 mg BID -Appropriate, Effective, Safe, Accessible Farxiga 10 mg daily -Appropriate, Effective, Safe, Accessible -Medications previously tried: none  -Current home readings: none  reported -Denies hypotensive/hypertensive symptoms; Denies chest pain, SOB, dizziness, falls. -Educated on importance of daily adherence; reviewed refill history -Counseled to monitor BP at home if symptomatic, document, and provide log at future appointments -Recommended to continue current medication  Hyperlipidemia: (LDL goal < 70) -Not ideally controlled - LDL 125 (05/2021) off of cholesterol meds -Hx CAD (CABG 2016); follows with cardiology (Dr Eldridge Dace); he has discussed PSCK9-inhibitors with cardiology pharmacist and deferred due to multiple injections -No aspirin due to Eliquis -Current treatment: Nitroglycerin 0.4 mg SL PRN -Medications previously tried: pravastatin 20 mg, rosuvastatin 10 mg TIW, simvastatin 10 mg TIW, atorvastatin, ezetimibe -Educated on Cholesterol goals;  -Consider Leqvio   Diabetes (A1c goal <7%) -Uncontrolled - A1c 9.3% (3/24), pt is now taking Tresiba 80 units daily and Novolog 20 units with dinner. He is taking Ozempic 1 mg on Sundays. He will start Ozempic 2 mg next week. -Meal patterns: pt reports eating 3 meals per day -Current home glucose readings: Freestyle Libre 2 > upgraded to [place] 05 March 2022 Reviewed AGP report: 04/04/22 to 04/17/22. Sensor active: 95%  Time in range (70-180): 32% (goal >  70%)  High (180-250): 28%  Very high (>250): 40%  Low (< 70): 0% (goal < 4%)  GMI: 8.7%; Average glucose: 227    Previous AGP report: 03/28/22 to 04/10/22. Sensor active: 97%  Time in range (70-180): 25% (goal > 70%)  High (180-250): 23%  Very high (>250): 51%  Low (< 70): 1% (goal < 4%)  GMI: 9.3%; Average glucose: 250   -Current medications: Farxiga 10 mg daily (PAP) - Appropriate, Query Effective Insulin Degludec U200 - 80 units daily AM (PAP) -  Novolog 20 units w/ meals (PAP) Ozempic 1 mg weekly (PAP) -Appropriate, Query Effective Freestyle Libre 3 (phone) - Appropriate, Effective, Safe, Accessible -Medications previously tried: glipizide  (hypoglycemia) -Reviewed CGM report - improvement since last week especially in last few days -Reviewed Novolog again as "mealtime" insulin, advised he take 20 units before meals (breakfast and dinner) -Advised to increase Ozempic to 2 mg starting next week (supply received at office)  Atrial Fibrillation (Goal: prevent stroke and major bleeding) -Followed by cardiology -CHADSVASC: 6 -Reviewed adherence: Eliquis PDC 53% less than ideal - pt rations Eliquis during the donut hole (Oct-Dec typically) -Current treatment: Carvedilol 6.25 mg BID - Appropriate, Effective, Safe, Accessible Eliquis 5 mg BID -Appropriate, Effective, Safe, Accessible -Medications previously tried: none reported -Home BP and HR readings: none reported -Counseled on increased risk of stroke due to Afib and benefits of anticoagulation for stroke prevention; importance of adherence to anticoagulant exactly as prescribed; avoidance of NSAIDs due to increased bleeding risk with anticoagulants; -Recommended to continue current medication  Health Maintenance -BPH: on tamsulosin   Al Corpus, PharmD, P[place]y [place], CPP Clinical Pharmacist Practitioner [facility] at [facility] [phone]

## 2022-04-19 ENCOUNTER — Telehealth: Payer: Self-pay

## 2022-04-19 LAB — CUP PACEART INCLINIC DEVICE CHECK
Battery Remaining Longevity: 112 mo
Battery Voltage: 3.01 V
Brady Statistic RV Percent Paced: 0.01 %
Date Time Interrogation Session: 20240409115400
HighPow Impedance: 70 Ohm
Implantable Lead Connection Status: 753985
Implantable Lead Implant Date: 20210105
Implantable Lead Location: 753860
Implantable Pulse Generator Implant Date: 20210105
Lead Channel Impedance Value: 399 Ohm
Lead Channel Impedance Value: 494 Ohm
Lead Channel Pacing Threshold Amplitude: 0.5 V
Lead Channel Pacing Threshold Pulse Width: 0.4 ms
Lead Channel Sensing Intrinsic Amplitude: 7.75 mV
Lead Channel Sensing Intrinsic Amplitude: 9.125 mV
Lead Channel Setting Pacing Amplitude: 2.5 V
Lead Channel Setting Pacing Pulse Width: 0.4 ms
Lead Channel Setting Sensing Sensitivity: 0.3 mV
Zone Setting Status: 755011
Zone Setting Status: 755011

## 2022-04-19 NOTE — Telephone Encounter (Signed)
Received patient assistance medication for 7 boxes of Insulin and 5 boxes of needles. Insulin is in middle fridge and box of needles is at my desk.  Patient has been notified. He advised he would come pick up today or tomorrow.

## 2022-04-19 NOTE — Progress Notes (Signed)
Care Management & Coordination Services Pharmacy Team   Reason for Encounter: Appointment Reminder   Contacted patient to confirm telephone appointment with Al Corpus, PharmD on 04/24/2022 at 3:45.   Spoke with patient on 04/19/2022   Do you have any problems getting your medications? No  What is your top health concern you would like to discuss at your upcoming visit? Patient stated he did not have any concerns at all.   Have you seen any other providers since your last visit with PCP? Yes   Star Rating Drugs:  Medication:                Last Fill:         Day Supply Farxiga 10 mg             PAP Ozempic 1 mg             PAP   Care Gaps: Annual wellness visit in last year? Yes 01/11/2022   If Diabetic: Last eye exam / retinopathy screening: Overdue Last diabetic foot exam: Up to date   Al Corpus, PharmD notified   Claudina Lick, [place] Clinical Pharmacy Assistant [phone]

## 2022-04-19 NOTE — Telephone Encounter (Signed)
Pt wife came in office to pick medication

## 2022-04-19 NOTE — Telephone Encounter (Signed)
Patient asssistant medication placed in middle firdge. 6 boxes of Insulin injectors  Patient is aware

## 2022-04-19 NOTE — Telephone Encounter (Signed)
Patient by and picked up medication.

## 2022-04-21 NOTE — Telephone Encounter (Signed)
Pt came in office to pick up Insulin

## 2022-04-24 ENCOUNTER — Other Ambulatory Visit: Payer: Self-pay | Admitting: Interventional Cardiology

## 2022-04-24 ENCOUNTER — Ambulatory Visit: Payer: Medicare Other | Admitting: Pharmacist

## 2022-04-24 NOTE — Telephone Encounter (Signed)
Pt last saw Dr Lalla Brothers 04/11/22, last labs 01/31/22 Creat 1.13, age 77, weight 91.9kg, based on specified criteria pt is on appropriate dosage of Eliquis  BID for afib.  Will refill rx.

## 2022-04-24 NOTE — Progress Notes (Signed)
Care Management & Coordination Services Pharmacy Note  04/24/2022 Name:  [name] MRN:  [id number] DOB:  09-22-1945  Summary: F/U visit -DM - A1c 9.3% (03/2022), pt is now taking Tresiba 80 units daily and Novolog 20 units with meals. He is taking Ozempic 1 mg on Sundays, he still has not increased Ozempic to 2 mg as he has several 1 mg pens left. -Reviewed AGP report: 04/11/22 to 04/24/22. Sensor active: 98%  Time in range (70-180): 40% (improved from 32%)  High (180-250): 32%  Very high (>250): 28% (improved from 40%)  Low (< 70): 0%   GMI: 8.3% (improved from 8.7%); Average glucose: 209 (improved from 227)  Recommendations/Changes made from today's visit: -Advised to increase Ozempic to 2 mg starting next week (take 1 mg x 2 injections at once until 1 mg pens runs out, then start with 2 mg pens x 1 injection per week) -Advised pt to contact office with any low glucose < 70  Follow up plan: -Pharmacist follow up televisit scheduled for 2 weeks -Cardiology appt 05/01/22; PCP appt 06/29/22    Subjective: [name] is an 77 y.o. year old male who is a primary patient of Doreene Nest, NP.  The care coordination team was consulted for assistance with disease management and care coordination needs.    Engaged with patient by telephone for follow up visit.  Recent office visits: 03/29/22 NP Mayra Reel OV: f/u - A1c 9.3%; increase Ozempic to 1 mg; declined nutrition and endo referral, pt declined.  Recent consult visits: 04/11/22 Dr Lalla Brothers (Cardiology): AF - monitor for now. If recurrence, favor Tikosyn over amiodarone.   ECHO 02/13/22: EF 25-30%. Stop lisinopril, start Entresto 3 days later.  01/31/22 PA Keitha Butte (Cardiology): f/u - persistent afib, 100% burden. Consider amiodarone or Tikosyn - order Echo. Schedule DCCV. 2/14.  Hospital visits: 02/21/22 planned admission - DCCV.   Objective:  Lab Results  Component Value Date   CREATININE 1.13 01/31/2022   BUN 15  01/31/2022   GFR 53.87 (L) 11/30/2021   EGFR 67 01/31/2022   GFRNONAA >60 01/07/2019   GFRAA >60 01/07/2019   NA 135 01/31/2022   K 4.3 01/31/2022   CALCIUM 9.3 01/31/2022   CO2 20 01/31/2022   GLUCOSE 324 (H) 01/31/2022    Lab Results  Component Value Date/Time   HGBA1C 9.3 (A) 03/29/2022 07:41 AM   HGBA1C 8.1 (A) 12/22/2021 10:38 AM   HGBA1C 9.2 (H) 01/06/2019 01:51 AM   HGBA1C 9.5 (H) 09/13/2018 09:04 AM   GFR 53.87 (L) 11/30/2021 03:17 PM   GFR 75.44 12/07/2020 08:35 AM   MICROALBUR 1.6 12/22/2021 11:10 AM   MICROALBUR 1.6 11/04/2015 08:26 AM    Last diabetic Eye exam:  Lab Results  Component Value Date/Time   HMDIABEYEEXA Retinopathy (A) 11/26/2019 12:00 AM    Last diabetic Foot exam: No results found for: "HMDIABFOOTEX"   Lab Results  Component Value Date   CHOL 176 05/04/2021   HDL 44 05/04/2021   LDLCALC 125 (H) 05/04/2021   TRIG 35 05/04/2021   CHOLHDL 4.0 05/04/2021       Latest Ref Rng & Units 03/30/2022    8:46 AM 05/04/2021    9:12 AM 12/07/2020    8:35 AM  Hepatic Function  Total Protein 6.0 - 8.3 g/dL 7.1  7.0  7.3   Albumin 3.5 - 5.2 g/dL 4.2  4.4  4.1   AST 0 - 37 U/L 22  33  48  ALT 0 - 53 U/L 32  42  114   Alk Phosphatase 39 - 117 U/L 70  107  114   Total Bilirubin 0.2 - 1.2 mg/dL 0.7  1.0  3.1   Bilirubin, Direct 0.0 - 0.3 mg/dL 0.1       Lab Results  Component Value Date/Time   TSH 1.75 03/30/2022 08:46 AM   TSH 1.529 01/06/2019 10:40 AM   TSH 0.748 06/18/2014 04:57 AM       Latest Ref Rng & Units 01/31/2022   11:49 AM 11/30/2021    3:17 PM 05/04/2021    9:12 AM  CBC  WBC 3.4 - 10.8 x10E3/uL 7.6  13.6  7.0   Hemoglobin 13.0 - 17.7 g/dL 16.1  09.6  04.5   Hematocrit 37.5 - 51.0 % 46.4  52.5  42.1   Platelets 150 - 450 x10E3/uL 242  259.0  193     No results found for: "VD25OH", "VITAMINB12"  Clinical ASCVD: Yes  The ASCVD Risk score (Arnett DK, et al., 2019) failed to calculate for the following reasons:   The patient has a  prior MI or stroke diagnosis    CHA2DS2/VAS Stroke Risk Points  Current as of 30 minutes ago     6 >= 2 Points: High Risk    Points Metrics  1 Has Congestive Heart Failure:  Yes    Current as of 30 minutes ago  1 Has Vascular Disease:  Yes    Current as of 30 minutes ago  1 Has Hypertension:  Yes    Current as of 30 minutes ago  2 Age:  67    Current as of 30 minutes ago  1 Has Diabetes:  Yes    Current as of 30 minutes ago  0 Had Stroke:  No  Had TIA:  No  Had Thromboembolism:  No    Current as of 30 minutes ago  0 Male:  No    Current as of 30 minutes ago        03/29/2022    7:26 AM 01/11/2022    3:22 PM 01/07/2021    3:34 PM  Depression screen PHQ 2/9  Decreased Interest 0 0 0  Down, Depressed, Hopeless 0 0 0  PHQ - 2 Score 0 0 0     Social History   Tobacco Use  Smoking Status Never  Smokeless Tobacco Never   BP Readings from Last 3 Encounters:  04/11/22 120/64  03/29/22 118/62  02/21/22 (!) 114/101   Pulse Readings from Last 3 Encounters:  04/11/22 76  03/29/22 60  02/21/22 (!) 103   Wt Readings from Last 3 Encounters:  04/11/22 202 lb 9.6 oz (91.9 kg)  03/29/22 199 lb (90.3 kg)  02/21/22 190 lb 0.6 oz (86.2 kg)   BMI Readings from Last 3 Encounters:  04/11/22 30.81 kg/m  03/29/22 30.27 kg/m  02/21/22 28.90 kg/m    Allergies  Allergen Reactions   Lipitor [Atorvastatin] Other (See Comments)    Myopathy Spring 2006   Pravastatin Other (See Comments)    LEG CRAMPS   Simvastatin Other (See Comments)    LEG CRAMPS    Medications Reviewed Today     Reviewed by Kathyrn Sheriff, [facility] (Pharmacist) on 04/24/22 at 1556  Med List Status: <None>   Medication Order Taking? Sig Documenting Provider Last Dose Status Informant  albuterol (VENTOLIN HFA) 108 (90 Base) MCG/ACT inhaler [id number] Yes Inhale 2 puffs into the lungs every 6 (six) hours as  needed for wheezing or shortness of breath. Copland, Karleen Hampshire, MD Taking Active Self  carvedilol  (COREG) 6.25 MG tablet [id number] Yes TAKE 1 TABLET BY MOUTH TWICE A DAY WITH MEAL Corky Crafts, MD Taking Active Self  Continuous Blood Gluc Sensor (FREESTYLE LIBRE 3 SENSOR) [place] [id number] Yes Place 1 sensor on the skin every 14 days. Use to check glucose continuously Doreene Nest, NP Taking Active Self  dapagliflozin propanediol (FARXIGA) 10 MG TABS tablet [id number] Yes Take 1 tablet (10 mg total) by mouth daily before breakfast. For diabetes. Doreene Nest, NP Taking Active Self           Med Note Marco Collie Apr 17, 2022  3:13 PM) AZ&Me PAP 2024  ELIQUIS 5 MG TABS tablet [id number] Yes TAKE 1 TABLET BY MOUTH TWICE A DAY Lanier Prude, MD Taking Active   insulin aspart (NOVOLOG FLEXPEN) 100 UNIT/ML FlexPen [id number] Yes Inject 20 Units into the skin 3 (three) times daily with meals. [provider] Taking Active            Med Note Marco Collie Apr 17, 2022  3:13 PM) Novo Cares PAP  insulin degludec (TRESIBA FLEXTOUCH) 200 UNIT/ML FlexTouch Pen [id number] Yes Inject 100 Units into the skin daily. Via [employer] PAP  Patient taking differently: Inject 80 Units into the skin daily. Via Novo Cares PAP   Doreene Nest, NP Taking Active Self           Med Note Marco Collie Apr 17, 2022  3:13 PM) Novo Cares PAP  Insulin Pen Needle 31G X 8 MM MISC [id number] Yes Use as directed with Lantus. Doreene Nest, NP Taking Active Self  isosorbide mononitrate (IMDUR) 30 MG 24 hr tablet [id number] Yes TAKE 1 TABLET BY MOUTH EVERY DAY Corky Crafts, MD Taking Active Self  nitroGLYCERIN (NITROSTAT) 0.4 MG SL tablet [id number] Yes Place 1 tablet (0.4 mg total) under the tongue every 5 (five) minutes as needed for chest pain. Corky Crafts, MD Taking Active Self  ondansetron (ZOFRAN-ODT) 4 MG disintegrating tablet [id number] Yes Take 1 tablet (4 mg total) by mouth every 8 (eight) hours as needed for nausea or vomiting.  Doreene Nest, NP Taking Active Self  OVER THE COUNTER MEDICATION [id number] Yes Place 1 drop into both eyes 2 (two) times daily. Happy body  liquid msm eye drops/ for floaters in the eyes [provider] Taking Active Self  sacubitril-valsartan (ENTRESTO) 24-26 MG [id number] Yes Take 1 tablet by mouth 2 (two) times daily. Sheilah Pigeon, PA-C Taking Active   Semaglutide, 2 MG/DOSE, (OZEMPIC, 2 MG/DOSE,) 8 MG/3ML SOPN [id number] Yes Inject 2 mg into the skin once a week. [provider] Taking Active            Med Note Marco Collie Apr 17, 2022  3:14 PM) Novo Cares PAP  tamsulosin (FLOMAX) 0.4 MG CAPS capsule [id number] Yes TAKE 1 CAPSULE (0.4 MG TOTAL) BY MOUTH DAILY. FOR URINE FLOW. Doreene Nest, NP Taking Active             SDOH:  (Social Determinants of Health) assessments and interventions performed: No SDOH Interventions    Flowsheet Row Clinical Support from 01/11/2022 in [facility] Chronic Care Management from 12/21/2020 in [facility] Clinical Support from 09/13/2018 [facility] Clinical Support from 08/24/2016 in [facility] [place]  SDOH Interventions      Food Insecurity Interventions Intervention Not Indicated -- -- --  Housing Interventions Intervention Not Indicated -- -- --  Transportation Interventions Intervention Not Indicated -- -- --  Utilities Interventions Intervention Not Indicated -- -- --  Depression Interventions/Treatment  -- -- ZOX0-9 Score <4 Follow-up Not Indicated --  [pt is being referred to PCP for further evaluation]  Financial Strain Interventions Intervention Not Indicated Other (Comment)  [Farxiga - PAP,  Eliquis - Pt to let us know when he goes into coverage gap in 2023.] -- --  Physical Activity Interventions Intervention Not Indicated -- -- --  Stress Interventions  Intervention Not Indicated -- -- --  Social Connections Interventions Intervention Not Indicated -- -- --       Medication Assistance:  Novo Cares - Ozempic (approved through 01/02/23) Novo Cares - [place], Novolog approved 2024 AZ&Me - [name] (approved through 01/02/23)  Medication Access: Within the past 30 days, how often has patient missed a dose of medication? 0 Is a pillbox or other method used to improve adherence? Yes  Factors that may affect medication adherence? lack of understanding of disease management Are meds synced by current pharmacy? No  Are meds delivered by current pharmacy? No  Does patient experience delays in picking up medications due to transportation concerns? No   [facility] Services Reviewed: Is patient disadvantaged to use [facility]?: No  Current Rx insurance plan: [facility] Name and location of Current pharmacy:  [facility] [phone] - [place], [place] - [address] [place] [address] Phone: [phone] Fax: [phone]  [facility] services reviewed with patient today?: No  Patient requests to transfer care to [facility]?: No  Reason patient declined to change pharmacies: Not mentioned at this visit  Compliance/Adherence/Medication fill history: Care Gaps: Eye exam (due 11/2020)  Star-Rating Drugs: Farxiga -PAP Ozempic - PAP   Assessment/Plan  Hypertension / Heart Failure (BP goal <130/80) -Controlled - per clinic readings -Last ejection fraction: 25-30% (Date: 02/2022) -HF type: HFrEF (EF < 40%); NYHA Class: I (no actitivty limitation) -Current treatment: Carvedilol 6.25 mg BID - Appropriate, Effective, Safe, Accessible Isosorbide Mononitrate 30 mg daily - Appropriate, Effective, Safe, Accessible Entresto 24-26 mg BID -Appropriate, Effective, Safe, Accessible Farxiga 10 mg daily -Appropriate, Effective, Safe, Accessible -Medications previously tried: none  -Current home readings: none  reported -Denies hypotensive/hypertensive symptoms; Denies chest pain, SOB, dizziness, falls. -Educated on importance of daily adherence; reviewed refill history -Counseled to monitor BP at home if symptomatic, document, and provide log at future appointments -Recommended to continue current medication  Hyperlipidemia: (LDL goal < 70) -Not ideally controlled - LDL 125 (05/2021) off of cholesterol meds -Hx CAD (CABG 2016); follows with cardiology (Dr Eldridge Dace); he has discussed PSCK9-inhibitors with cardiology pharmacist and deferred due to multiple injections -No aspirin due to Eliquis -Current treatment: Nitroglycerin 0.4 mg SL PRN -Medications previously tried: pravastatin 20 mg, rosuvastatin 10 mg TIW, simvastatin 10 mg TIW, atorvastatin, ezetimibe -Educated on Cholesterol goals;  -Consider Leqvio   Diabetes (A1c goal <7%) -Uncontrolled, improving - A1c 9.3% (3/24), pt is now taking Tresiba 80 units daily and Novolog 20 units with meals; He is taking Ozempic 1 mg on Sundays. He will start Ozempic 2 mg next week. -Meal patterns: pt reports eating 3 meals per day -Current home glucose readings: Freestyle Libre 2 > upgraded to [place] 05 March 2022 Reviewed AGP report: 4/9/24to  04/24/22. Sensor active: 98%  Time in range (70-180): 40% (goal > 70%)  High (180-250): 32%  Very high (>250): 28%  Low (< 70): 0% (goal < 4%)  GMI: 8.3%; Average glucose: 209    Previous AGP report: 04/04/22 to 04/17/22. Sensor active: 95%  Time in range (70-180): 32% (goal > 70%)  High (180-250): 28%  Very high (>250): 40%  Low (< 70): 0% (goal < 4%)  GMI: 8.7%; Average glucose: 227    -Current medications: Farxiga 10 mg daily (PAP) - Appropriate, Query Effective Insulin Degludec U200 - 80 units daily AM (PAP) - Appropriate, Effective, Safe, Accessible Novolog 20 units w/ meals (PAP) -Appropriate, Effective, Safe, Accessible Ozempic 1 mg weekly (PAP) -Appropriate, Query Effective Freestyle Libre 3 (phone)  - Appropriate, Effective, Safe, Accessible -Medications previously tried: glipizide (hypoglycemia) -Reviewed CGM report - improvement since last week  -Advised to increase Ozempic to 2 mg starting next week (take 1 mg x 2 injections at once until 1 mg pens runs out, then start with 2 mg pens x 1 injection per week)  Atrial Fibrillation (Goal: prevent stroke and major bleeding) -Followed by cardiology -CHADSVASC: 6 -Reviewed adherence: Eliquis PDC 53% less than ideal - pt rations Eliquis during the donut hole (Oct-Dec typically) -Current treatment: Carvedilol 6.25 mg BID - Appropriate, Effective, Safe, Accessible Eliquis 5 mg BID -Appropriate, Effective, Safe, Accessible -Medications previously tried: none reported -Home BP and HR readings: none reported -Counseled on increased risk of stroke due to Afib and benefits of anticoagulation for stroke prevention; importance of adherence to anticoagulant exactly as prescribed; avoidance of NSAIDs due to increased bleeding risk with anticoagulants; -Recommended to continue current medication  Health Maintenance -BPH: on tamsulosin   Al Corpus, PharmD, Patsy Baltimore, CPP Clinical Pharmacist Practitioner [facility] at [facility] [phone]

## 2022-04-30 NOTE — Progress Notes (Unsigned)
Cardiology Office Note   Date:  05/01/2022   ID:  [name], DOB 1945/04/05, MRN [id number]  PCP:  Doreene Nest, NP    No chief complaint on file.  CAD  Wt Readings from Last 3 Encounters:  05/01/22 203 lb 6.4 oz (92.3 kg)  04/11/22 202 lb 9.6 oz (91.9 kg)  03/29/22 199 lb (90.3 kg)       History of Present Illness: [name] is a 77 y.o. male  who presents today to follow-up coronary disease.  He had stents in 5/16 with rotational atherectomy.  He then had early restenosis and progression of left sided disease. He had surgery with Dr. Dorris Fetch.  He is post-CABG; after having surgery in June 2016.  He has some toe numbness intermittently. His surgery was June 22, 2014. In addition he had a maze procedure and a left atrial appendage clip was placed. He remains on anticoagulation. During his initial hospitalization his EF was in the 40-45% range.  EF had normalized at 55%.   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  He had palpitations with his prior atrial fibrillation. He did have left atrial appendage clipping during his open heart surgery.   He had a NSTEMI in Augist 2017.  EF by cath was low.  EF by echo was 50-55%. SVG to RCA occluded but medically managed.     He had syncope in Jan 2021.  EF has decreased to EF 35-40%.  Cath was done: "Severe native coronary artery disease, as detailed below, not significant changed from prior catheterization in 2017. Widely patent LIMA-LAD. Patent but small and diffusely diseased SVG-rPDA-rPL, with a focal 90% stenosis just before the SVG-rPDA anastomosis.  Graft appearance is similar to prior catheterization in 2017. Chronically occluded SVG-ramus-D1. Mildly elevated left ventricular filling pressure.   Recommendations: Given stable appearance of coronary artery disease and negligible troponin, I do not believe that worsening CAD is driving his dizziness/syncope.  Further evaluation for potential arrhythmic  cause is recommended. Continue medical therapy and aggressive secondary prevention. Restart apixaban tomorrow if no evidence of bleeding/vascular complications."   AICD was placed:" Implantation of a Medtronic single chamber ICD on 01/07/2019 by Dr. Johney Frame.  The patient received a Medtronic model number [id number] ICD with model number [id number] right ventricular lead. DFT's were deferred at time of implant."   Played golf for exercise.    Stopped Zetia due to body aches in Jan 2023.  Feels much better.  4/23 from lipid clinic: "Discussed PCSK9i with pt. He remains hesitant to try therapy due to cost and since he already takes injections and doesn't wish to add more. Advised pt to call clinic if he wishes to start PCSK9i. "   [name] has brought down A1C from >9 to 7.8. In 2024, he saw Dr. Lalla Brothers: "AF burden had increased to 95.9%. I had him see Renee who scheduled a cardioversion. He takes Eliquis.    He had a DCCV 02/21/2022.   Today, he reports feeling much better after his cardioversion.  Improved energy levels and breathing.  He is active and plays golf at least 3 times per week."  Blood sugar has been high.  He eats sandwiches with white bread at least 3x/week.  Eats [employer] croissant egg and sausage in the mornings.   Past Medical History:  Diagnosis Date   Arthritis    "fingers, shoulders" (08/03/2015)   Atrial fibrillation (HCC)    Paroxysmal, rare episodes, sinus rhythm  on flecainide, patient prefers not to take Coumadin   Bradycardia    April, 2013   Chest pain    Nuclear, 2006, no ischemia   Chronic lower back pain    "all my life"   Coronary artery disease    s/p staged cath 05/12/2014 and 5/11, DES x 2 to heavily calcified RCA, residual with 60% prox LAD, 70% D1   Ejection fraction    EF 55-60%,  echo, January, 2011   Elevated CPK    CPK elevated with normal MB and normal troponin the past   Heart murmur    History of echocardiogram    Echo 8/16: EF 55%, normal wall  motion, grade 2 diastolic dysfunction, trivial MR, normal RV function, PASP 27 mmHg   Hypertension    IBS (irritable bowel syndrome)    Myocardial infarction (HCC) 06/2014   Sinus drainage    Type II diabetes mellitus (HCC)     Past Surgical History:  Procedure Laterality Date   APPENDECTOMY     CARDIAC CATHETERIZATION N/A 05/12/2014   Procedure: Left Heart Cath and Coronary Angiography;  Surgeon: Lennette Bihari, MD;  Location: [facility];  Service: Cardiovascular;  Laterality: N/A;   CARDIAC CATHETERIZATION N/A 05/13/2014   Procedure: Coronary/Graft Atherectomy;  Surgeon: Lennette Bihari, MD;  Location: [facility];  Service: Cardiovascular;  Laterality: N/A;   CARDIAC CATHETERIZATION Right 05/13/2014   Procedure: Temporary Pacemaker;  Surgeon: Lennette Bihari, MD;  Location: [facility];  Service: Cardiovascular;  Laterality: Right;   CARDIAC CATHETERIZATION N/A 05/13/2014   Procedure: Coronary Stent Intervention;  Surgeon: Lennette Bihari, MD;  Location: [facility];  Service: Cardiovascular;  Laterality: N/A;   CARDIAC CATHETERIZATION N/A 06/18/2014   Procedure: Left Heart Cath and Coronary Angiography;  Surgeon: Peter M Swaziland, MD;  [facility] INVASIVE CV LAB;  Service: Cardiovascular;  Laterality: N/A;   CARDIAC CATHETERIZATION N/A 08/03/2015   Procedure: Left Heart Cath and Cors/Grafts Angiography;  Surgeon: Yvonne Kendall, MD;  Location: [facility] INVASIVE CV LAB;  Service: Cardiovascular;  Laterality: N/A;   CARDIOVASCULAR STRESS TEST  05/06/2014   CARDIOVERSION N/A 02/21/2022   Procedure: CARDIOVERSION;  Surgeon: Meriam Sprague, MD;  Location: [facility] ENDOSCOPY;  Service: Cardiovascular;  Laterality: N/A;   CATARACT EXTRACTION W/ INTRAOCULAR LENS IMPLANT Left    COLONOSCOPY WITH PROPOFOL N/A 02/13/2020   Procedure: COLONOSCOPY WITH PROPOFOL;  Surgeon: Jeani Hawking, MD;  Location: [facility] ENDOSCOPY;  Service: Endoscopy;  Laterality: N/A;   CORONARY ARTERY BYPASS GRAFT  N/A 06/22/2014   Procedure: CORONARY ARTERY BYPASS GRAFTING (CABG) x five, using left internal mammary artery, and right leg greater saphenous vein harvested endoscopically;  Surgeon: Loreli Slot, MD;  Location: [facility] OR;  Service: Open Heart Surgery;  Laterality: N/A;   ICD IMPLANT N/A 01/07/2019   Procedure: ICD IMPLANT;  Surgeon: Hillis Range, MD;  Location: [facility];  Service: Cardiovascular;  Laterality: N/A;   LAPAROSCOPIC CHOLECYSTECTOMY     LEFT HEART CATH AND CORS/GRAFTS ANGIOGRAPHY N/A 01/06/2019   Procedure: LEFT HEART CATH AND CORS/GRAFTS ANGIOGRAPHY;  Surgeon: Yvonne Kendall, MD;  Location: [facility];  Service: Cardiovascular;  Laterality: N/A;   MAZE N/A 06/22/2014   Procedure: MAZE;  Surgeon: Loreli Slot, MD;  Location: [facility] OR;  Service: Open Heart Surgery;  Laterality: N/A;   POLYPECTOMY  02/13/2020   Procedure: POLYPECTOMY;  Surgeon: Jeani Hawking, MD;  Location: [facility] ENDOSCOPY;  Service: Endoscopy;;  TEE WITHOUT CARDIOVERSION N/A 06/22/2014   Procedure: TRANSESOPHAGEAL ECHOCARDIOGRAM (TEE);  Surgeon: Loreli Slot, MD;  Location: [facility] OR;  Service: Open Heart Surgery;  Laterality: N/A;     Current Outpatient Medications  Medication Sig Dispense Refill   albuterol (VENTOLIN HFA) 108 (90 Base) MCG/ACT inhaler Inhale 2 puffs into the lungs every 6 (six) hours as needed for wheezing or shortness of breath. 8 g 3   carvedilol (COREG) 6.25 MG tablet TAKE 1 TABLET BY MOUTH TWICE A DAY WITH MEAL 180 tablet 1   Continuous Blood Gluc Sensor (FREESTYLE LIBRE 3 SENSOR) MISC Place 1 sensor on the skin every 14 days. Use to check glucose continuously 6 each 1   dapagliflozin propanediol (FARXIGA) 10 MG TABS tablet Take 1 tablet (10 mg total) by mouth daily before breakfast. For diabetes. 90 tablet 1   ELIQUIS 5 MG TABS tablet TAKE 1 TABLET BY MOUTH TWICE A DAY 60 tablet 5   insulin aspart (NOVOLOG FLEXPEN) 100 UNIT/ML FlexPen Inject 20 Units into the skin 3  (three) times daily with meals.     insulin degludec (TRESIBA FLEXTOUCH) 200 UNIT/ML FlexTouch Pen Inject 100 Units into the skin daily. Via [employer] PAP (Patient taking differently: Inject 80 Units into the skin daily. Via [employer] PAP) 36 mL 0   Insulin Pen Needle 31G X 8 MM MISC Use as directed with Lantus. 100 each 5   isosorbide mononitrate (IMDUR) 30 MG 24 hr tablet TAKE 1 TABLET BY MOUTH EVERY DAY 90 tablet 2   nitroGLYCERIN (NITROSTAT) 0.4 MG SL tablet Place 1 tablet (0.4 mg total) under the tongue every 5 (five) minutes as needed for chest pain. 25 tablet 6   ondansetron (ZOFRAN-ODT) 4 MG disintegrating tablet Take 1 tablet (4 mg total) by mouth every 8 (eight) hours as needed for nausea or vomiting. 15 tablet 0   OVER THE COUNTER MEDICATION Place 1 drop into both eyes 2 (two) times daily. Happy body  liquid msm eye drops/ for floaters in the eyes     sacubitril-valsartan (ENTRESTO) 24-26 MG Take 1 tablet by mouth 2 (two) times daily. 180 tablet 2   Semaglutide, 2 MG/DOSE, (OZEMPIC, 2 MG/DOSE,) 8 MG/3ML SOPN Inject 2 mg into the skin once a week.     tamsulosin (FLOMAX) 0.4 MG CAPS capsule TAKE 1 CAPSULE (0.4 MG TOTAL) BY MOUTH DAILY. FOR URINE FLOW. 90 capsule 3   No current facility-administered medications for this visit.    Allergies:   Lipitor [atorvastatin], Pravastatin, and Simvastatin    Social History:  The patient  reports that he has never smoked. He has never used smokeless tobacco. He reports that he does not drink alcohol and does not use drugs.   Family History:  The patient's family history includes Alzheimer's disease in his mother; Arrhythmia in his sister; Cancer in his brother; Emphysema in his father; Heart attack in his sister; Heart disease in his sister; Hyperlipidemia in his sister; Hypertension in his sister.    ROS:  Please see the history of present illness.   Otherwise, review of systems are positive for left leg sciatic pain.   All other systems are  reviewed and negative.    PHYSICAL EXAM: VS:  BP 118/66 (BP Location: Left Arm, Patient Position: Sitting, Cuff Size: Normal)   Pulse 79   Ht 5\' 8"  (1.727 m)   Wt 203 lb 6.4 oz (92.3 kg)   SpO2 98%   BMI 30.93 kg/m  , BMI Body  mass index is 30.93 kg/m. GEN: Well nourished, well developed, in no acute distress HEENT: normal Neck: no JVD, carotid bruits, or masses Cardiac: RRR, premature beats; no murmurs, rubs, or gallops,no edema  Respiratory:  clear to auscultation bilaterally, normal work of breathing GI: soft, nontender, nondistended, + BS MS: no deformity or atrophy Skin: warm and dry, no rash Neuro:  Strength and sensation are intact Psych: euthymic mood, full affect   EKG:   The ekg ordered in 4/24 demonstrates NSR   Recent Labs: 01/31/2022: BUN 15; Creatinine, Ser 1.13; Hemoglobin 15.6; Platelets 242; Potassium 4.3; Sodium 135 03/30/2022: ALT 32; TSH 1.75   Lipid Panel    Component Value Date/Time   CHOL 176 05/04/2021 0912   TRIG 35 05/04/2021 0912   HDL 44 05/04/2021 0912   CHOLHDL 4.0 05/04/2021 0912   CHOLHDL 3 12/07/2020 0835   VLDL 12.0 12/07/2020 0835   LDLCALC 125 (H) 05/04/2021 0912     Other studies Reviewed: Additional studies/ records that were reviewed today with results demonstrating: .   ASSESSMENT AND PLAN:  CAD: Status post CABG in 2016.  Continue aggressive secondary prevention.  Exercise target noted below.  He does play golf regularly and has some workout equipment at home.  I encouraged light weights to maintain muscle mass.  This should also help with blood sugar. Chronic systolic heart failure: Last ejection fraction was 35 to 40%.  He has been on ACE inhibitor, beta-blocker and Farxiga.  He was reluctant to switch to [facility] in the past. Status post AICD: Followed by EP.  9+ years remaining on his battery. Hypertension: Avoid excessive salt.  The current medical regimen is effective;  continue present plan and  medications. Hyperlipidemia: Intolerant to statins and Zetia.  Whole food, plant-based diet.  High-fiber diet.  Avoid processed foods.  He did not want to start PCSK9 inhibitor when it was discussed with the lipid clinic.  High-fiber diet info given to the patient. Type 2 diabetes: A1C 9.3.  Libre3 sensor. We talked about increasing fiber and decreasing bread intake and processed foods. Sensor warns him of low blood sugar.  We discussed that any packaged food that he buys will contain added sugar.  Sticking to on processed foods will be helpful for his blood sugar. P AF: Status post left atrial appendage clipping at the time of bypass surgery.  He has been on Eliquis for stroke prevention.  A-fib burden has increased.  Tikosyn was discussed along with amiodarone. DCCV in 2/24.  Maintaining NSR today.   Current medicines are reviewed at length with the patient today.  The patient concerns regarding his medicines were addressed.  The following changes have been made:  No change  Labs/ tests ordered today include:  No orders of the defined types were placed in this encounter.   Recommend 150 minutes/week of aerobic exercise Low fat, low carb, high fiber diet recommended  Disposition:   FU in 1 year   Signed, Lance Muss, MD  05/01/2022 8:20 AM    [facility] [address] [place], [place], [place]  [address] Phone: [phone]; Fax: [phone]

## 2022-05-01 ENCOUNTER — Ambulatory Visit: Payer: Medicare Other | Attending: Interventional Cardiology | Admitting: Interventional Cardiology

## 2022-05-01 ENCOUNTER — Encounter: Payer: Self-pay | Admitting: Interventional Cardiology

## 2022-05-01 VITALS — BP 118/66 | HR 79 | Ht 68.0 in | Wt 203.4 lb

## 2022-05-01 DIAGNOSIS — I4819 Other persistent atrial fibrillation: Secondary | ICD-10-CM | POA: Diagnosis not present

## 2022-05-01 DIAGNOSIS — I5022 Chronic systolic (congestive) heart failure: Secondary | ICD-10-CM | POA: Diagnosis not present

## 2022-05-01 DIAGNOSIS — I2510 Atherosclerotic heart disease of native coronary artery without angina pectoris: Secondary | ICD-10-CM

## 2022-05-01 DIAGNOSIS — Z95810 Presence of automatic (implantable) cardiac defibrillator: Secondary | ICD-10-CM

## 2022-05-01 DIAGNOSIS — I255 Ischemic cardiomyopathy: Secondary | ICD-10-CM | POA: Diagnosis not present

## 2022-05-01 NOTE — Patient Instructions (Signed)
Medication Instructions:  Your physician recommends that you continue on your current medications as directed. Please refer to the Current Medication list given to you today.  *If you need a refill on your cardiac medications before your next appointment, please call your pharmacy*   Lab Work: none If you have labs (blood work) drawn today and your tests are completely normal, you will receive your results only by: MyChart Message (if you have MyChart) OR A paper copy in the mail If you have any lab test that is abnormal or we need to change your treatment, we will call you to review the results.   Testing/Procedures: none   Follow-Up: At [facility], you and your health needs are our priority.  As part of our continuing mission to provide you with exceptional heart care, we have created designated Provider Care Teams.  These Care Teams include your primary Cardiologist (physician) and Advanced Practice Providers (APPs -  Physician Assistants and Nurse Practitioners) who all work together to provide you with the care you need, when you need it.  We recommend signing up for the patient portal called "MyChart".  Sign up information is provided on this After Visit Summary.  MyChart is used to connect with patients for Virtual Visits (Telemedicine).  Patients are able to view lab/test results, encounter notes, upcoming appointments, etc.  Non-urgent messages can be sent to your provider as well.   To learn more about what you can do with MyChart, go to [email or web address].    Your next appointment:   12 month(s)  Provider:   Jayadeep Varanasi, MD     Other Instructions  High-Fiber Eating Plan Fiber, also called dietary fiber, is a type of carbohydrate. It is found foods such as fruits, vegetables, whole grains, and beans. A high-fiber diet can have many health benefits. Your health care provider may recommend a high-fiber diet to help: Prevent constipation. Fiber can make  your bowel movements more regular. Lower your cholesterol. Relieve the following conditions: Inflammation of veins in the anus (hemorrhoids). Inflammation of specific areas of the digestive tract (uncomplicated diverticulosis). A problem of the large intestine, also called the colon, that sometimes causes pain and diarrhea (irritable bowel syndrome, or IBS). Prevent overeating as part of a weight-loss plan. Prevent heart disease, type 2 diabetes, and certain cancers. What are tips for following this plan? Reading food labels  Check the nutrition facts label on food products for the amount of dietary fiber. Choose foods that have 5 grams of fiber or more per serving. The goals for recommended daily fiber intake include: Men (age 50 or younger): 34-38 g. Men (over age 50): 28-34 g. Women (age 50 or younger): 25-28 g. Women (over age 50): 22-25 g. Your daily fiber goal is _____________ g. Shopping Choose whole fruits and vegetables instead of processed forms, such as apple juice or applesauce. Choose a wide variety of high-fiber foods such as avocados, lentils, oats, and kidney beans. Read the nutrition facts label of the foods you choose. Be aware of foods with added fiber. These foods often have high sugar and sodium amounts per serving. Cooking Use whole-grain flour for baking and cooking. Cook with brown rice instead of white rice. Meal planning Start the day with a breakfast that is high in fiber, such as a cereal that contains 5 g of fiber or more per serving. Eat breads and cereals that are made with whole-grain flour instead of refined flour or white flour. Eat brown rice, bulgur wheat,   or millet instead of white rice. Use beans in place of meat in soups, salads, and pasta dishes. Be sure that half of the grains you eat each day are whole grains. General information You can get the recommended daily intake of dietary fiber by: Eating a variety of fruits, vegetables, grains,  nuts, and beans. Taking a fiber supplement if you are not able to take in enough fiber in your diet. It is better to get fiber through food than from a supplement. Gradually increase how much fiber you consume. If you increase your intake of dietary fiber too quickly, you may have bloating, cramping, or gas. Drink plenty of water to help you digest fiber. Choose high-fiber snacks, such as berries, raw vegetables, nuts, and popcorn. What foods should I eat? Fruits Berries. Pears. Apples. Oranges. Avocado. Prunes and raisins. Dried figs. Vegetables Sweet potatoes. Spinach. Kale. Artichokes. Cabbage. Broccoli. Cauliflower. Green peas. Carrots. Squash. Grains Whole-grain breads. Multigrain cereal. Oats and oatmeal. Brown rice. Barley. Bulgur wheat. Millet. Quinoa. Bran muffins. Popcorn. Rye wafer crackers. Meats and other proteins Navy beans, kidney beans, and pinto beans. Soybeans. Split peas. Lentils. Nuts and seeds. Dairy Fiber-fortified yogurt. Beverages Fiber-fortified soy milk. Fiber-fortified orange juice. Other foods Fiber bars. The items listed above may not be a complete list of recommended foods and beverages. Contact a dietitian for more information. What foods should I avoid? Fruits Fruit juice. Cooked, strained fruit. Vegetables Fried potatoes. Canned vegetables. Well-cooked vegetables. Grains White bread. Pasta made with refined flour. White rice. Meats and other proteins Fatty cuts of meat. Fried chicken or fried fish. Dairy Milk. Yogurt. Cream cheese. Sour cream. Fats and oils Butters. Beverages Soft drinks. Other foods Cakes and pastries. The items listed above may not be a complete list of foods and beverages to avoid. Talk with your dietitian about what choices are best for you. Summary Fiber is a type of carbohydrate. It is found in foods such as fruits, vegetables, whole grains, and beans. A high-fiber diet has many benefits. It can help to prevent  constipation, lower blood cholesterol, aid weight loss, and reduce your risk of heart disease, diabetes, and certain cancers. Increase your intake of fiber gradually. Increasing fiber too quickly may cause cramping, bloating, and gas. Drink plenty of water while you increase the amount of fiber you consume. The best sources of fiber include whole fruits and vegetables, whole grains, nuts, seeds, and beans. This information is not intended to replace advice given to you by your health care provider. Make sure you discuss any questions you have with your health care provider. Document Revised: 04/24/2019 Document Reviewed: 04/24/2019 Elsevier Patient Education  2023 Elsevier Inc.   

## 2022-05-03 ENCOUNTER — Telehealth: Payer: Self-pay

## 2022-05-03 NOTE — Progress Notes (Unsigned)
Care Management & Coordination Services Pharmacy Team  Reason for Encounter: Appointment Reminder  Contacted patient to confirm telephone appointment with Al Corpus, PharmD on 05/08/2022 at 3:00.  {US HC Outreach:28874}  Do you have any problems getting your medications? {yes/no:20286} If yes what types of problems are you experiencing? {Problems:27223}  What is your top health concern you would like to discuss at your upcoming visit?   Have you seen any other providers since your last visit with PCP? Yes  Star Rating Drugs:  Medication:                Last Fill:         Day Supply Farxiga 10 mg             PAP Ozempic 1 mg             PAP   Care Gaps: Annual wellness visit in last year? Yes 01/11/2022   If Diabetic: Last eye exam / retinopathy screening: Overdue Last diabetic foot exam: Up to date   Al Corpus, PharmD notified   Claudina Lick, [place] Clinical Pharmacy Assistant [phone]

## 2022-05-07 NOTE — Progress Notes (Unsigned)
    [name], MD, CAQ Sports Medicine [facility] [address] [place], [address]  Phone: [phone]  FAX: [phone]  [name] - 77 y.o. male  MRN [id number]  Date of Birth: Dec 21, 1945  Date: 05/08/2022  PCP: Doreene Nest, NP  Referral: Doreene Nest, NP  No chief complaint on file.  Subjective:   [name] is a 77 y.o. very pleasant male patient with There is no height or weight on file to calculate BMI. who presents with the following:  Patient has a history of multilevel degenerative disc disease and facet arthropathy.  I saw him 6 months ago with some bilateral lumbar radiculopathy.  I gave him a course of some prednisone and and he is here today again in follow-up.  I saw him several years ago some right-sided lumbar radiculopathy.    Review of Systems is noted in the HPI, as appropriate  Objective:   There were no vitals taken for this visit.  GEN: No acute distress; alert,appropriate. PULM: Breathing comfortably in no respiratory distress PSYCH: Normally interactive.   Laboratory and Imaging Data:  Assessment and Plan:   ***

## 2022-05-08 ENCOUNTER — Ambulatory Visit: Payer: Medicare Other | Admitting: Pharmacist

## 2022-05-08 ENCOUNTER — Ambulatory Visit (INDEPENDENT_AMBULATORY_CARE_PROVIDER_SITE_OTHER): Payer: Medicare Other | Admitting: Family Medicine

## 2022-05-08 ENCOUNTER — Encounter: Payer: Self-pay | Admitting: Family Medicine

## 2022-05-08 VITALS — BP 120/68 | HR 75 | Temp 97.9°F | Ht 68.0 in | Wt 204.4 lb

## 2022-05-08 DIAGNOSIS — G8929 Other chronic pain: Secondary | ICD-10-CM | POA: Diagnosis not present

## 2022-05-08 DIAGNOSIS — M5441 Lumbago with sciatica, right side: Secondary | ICD-10-CM | POA: Diagnosis not present

## 2022-05-08 DIAGNOSIS — M5137 Other intervertebral disc degeneration, lumbosacral region: Secondary | ICD-10-CM | POA: Diagnosis not present

## 2022-05-08 DIAGNOSIS — M5442 Lumbago with sciatica, left side: Secondary | ICD-10-CM | POA: Diagnosis not present

## 2022-05-08 MED ORDER — PREDNISONE 20 MG PO TABS: ORAL_TABLET | ORAL | 0 refills | Status: DC

## 2022-05-08 MED ORDER — GABAPENTIN 300 MG PO CAPS: 300.0000 mg | ORAL_CAPSULE | Freq: Three times a day (TID) | ORAL | 3 refills | Status: DC

## 2022-05-08 NOTE — Progress Notes (Signed)
Care Management & Coordination Services Pharmacy Note  05/08/2022 Name:  [name] MRN:  [id number] DOB:  [birth year]  Summary: F/U visit -DM - A1c 9.3% (03/2022), pt is now taking Tresiba 80 units daily and Novolog 20 units "at night", not with all 3 meals. He is taking Ozempic 1 mg on Sundays, he still has not increased Ozempic to 2 mg as he has several 1 mg pens left. Of note he was prescribed a 10-day course of prednisone earlier today for back pain, discussed how this will likely increase glucose. Reviewed AGP report: 04/25/22 to 05/08/22. Sensor active: 97%  Time in range (70-180): 26% (worsened from 40%)  High (180-250): 30%  Very high (>250): 44% (worsened from 28%)  Low (< 70): 0% (goal < 4%)  GMI: 9.1%(worsened from 8.3%)  Average glucose: 244 (worsened from 209)  Recommendations/Changes made from today's visit: -Advised to take Novolog with each meal (not "at night"/bedtime) -Advised to increase Ozempic to 2 mg starting today (take 1 mg x 2 injections at once until 1 mg pens runs out, then start with 2 mg pens x 1 injection per week) -Advised pt to contact PharmD/provider if prednisone increases glucose; would temporarily increase Tresiba to 90 units and Novolog to 25 units with meals if needed  Follow up plan: -Pharmacist follow up televisit scheduled for 2 weeks -Cardiology appt 05/01/22; PCP appt 06/29/22    Subjective: [name] is an 77 y.o. year old male who is a primary patient of Doreene Nest, NP.  The care coordination team was consulted for assistance with disease management and care coordination needs.    Engaged with patient by telephone for follow up visit.  Recent office visits: 05/08/22 Dr Patsy Lager OV: back pain - rx gabapentin 300 mg TID. Rx prednisone 40 mg x 1 week, 20 mg x 1 week  03/29/22 NP Mayra Reel OV: f/u - A1c 9.3%; increase Ozempic to 1 mg; declined nutrition and endo referral, pt declined.  Recent consult visits: 05/01/22 Dr Eldridge Dace  (Cardiology): f/u - intolerant to statins/zetia. Did not want PCSK9. No changes.  04/11/22 Dr Lalla Brothers (Cardiology): AF - monitor for now. If recurrence, favor Tikosyn over amiodarone.   ECHO 02/13/22: EF 25-30%. Stop lisinopril, start Entresto 3 days later.  01/31/22 PA Keitha Butte (Cardiology): f/u - persistent afib, 100% burden. Consider amiodarone or Tikosyn - order Echo. Schedule DCCV. 2/14.  Hospital visits: 02/21/22 planned admission - DCCV.   Objective:  Lab Results  Component Value Date   CREATININE 1.13 01/31/2022   BUN 15 01/31/2022   GFR 53.87 (L) 11/30/2021   EGFR 67 01/31/2022   GFRNONAA >60 01/07/2019   GFRAA >60 01/07/2019   NA 135 01/31/2022   K 4.3 01/31/2022   CALCIUM 9.3 01/31/2022   CO2 20 01/31/2022   GLUCOSE 324 (H) 01/31/2022    Lab Results  Component Value Date/Time   HGBA1C 9.3 (A) 03/29/2022 07:41 AM   HGBA1C 8.1 (A) 12/22/2021 10:38 AM   HGBA1C 9.2 (H) 01/06/2019 01:51 AM   HGBA1C 9.5 (H) 09/13/2018 09:04 AM   GFR 53.87 (L) 11/30/2021 03:17 PM   GFR 75.44 12/07/2020 08:35 AM   MICROALBUR 1.6 12/22/2021 11:10 AM   MICROALBUR 1.6 11/04/2015 08:26 AM    Last diabetic Eye exam:  Lab Results  Component Value Date/Time   HMDIABEYEEXA Retinopathy (A) 11/26/2019 12:00 AM    Last diabetic Foot exam: No results found for: "HMDIABFOOTEX"   Lab Results  Component Value Date   CHOL  176 05/04/2021   HDL 44 05/04/2021   LDLCALC 125 (H) 05/04/2021   TRIG 35 05/04/2021   CHOLHDL 4.0 05/04/2021       Latest Ref Rng & Units 03/30/2022    8:46 AM 05/04/2021    9:12 AM 12/07/2020    8:35 AM  Hepatic Function  Total Protein 6.0 - 8.3 g/dL 7.1  7.0  7.3   Albumin 3.5 - 5.2 g/dL 4.2  4.4  4.1   AST 0 - 37 U/L 22  33  48   ALT 0 - 53 U/L 32  42  114   Alk Phosphatase 39 - 117 U/L 70  107  114   Total Bilirubin 0.2 - 1.2 mg/dL 0.7  1.0  3.1   Bilirubin, Direct 0.0 - 0.3 mg/dL 0.1       Lab Results  Component Value Date/Time   TSH 1.75 03/30/2022 08:46 AM    TSH 1.529 01/06/2019 10:40 AM   TSH 0.748 06/18/2014 04:57 AM       Latest Ref Rng & Units 01/31/2022   11:49 AM 11/30/2021    3:17 PM 05/04/2021    9:12 AM  CBC  WBC 3.4 - 10.8 x10E3/uL 7.6  13.6  7.0   Hemoglobin 13.0 - 17.7 g/dL 16.1  09.6  04.5   Hematocrit 37.5 - 51.0 % 46.4  52.5  42.1   Platelets 150 - 450 x10E3/uL 242  259.0  193     No results found for: "VD25OH", "VITAMINB12"  Clinical ASCVD: Yes  The ASCVD Risk score (Arnett DK, et al., 2019) failed to calculate for the following reasons:   The patient has a prior MI or stroke diagnosis    CHA2DS2/VAS Stroke Risk Points  Current as of 30 minutes ago     6 >= 2 Points: High Risk    Points Metrics  1 Has Congestive Heart Failure:  Yes    Current as of 30 minutes ago  1 Has Vascular Disease:  Yes    Current as of 30 minutes ago  1 Has Hypertension:  Yes    Current as of 30 minutes ago  2 Age:  19    Current as of 30 minutes ago  1 Has Diabetes:  Yes    Current as of 30 minutes ago  0 Had Stroke:  No  Had TIA:  No  Had Thromboembolism:  No    Current as of 30 minutes ago  0 Male:  No    Current as of 30 minutes ago        03/29/2022    7:26 AM 01/11/2022    3:22 PM 01/07/2021    3:34 PM  Depression screen PHQ 2/9  Decreased Interest 0 0 0  Down, Depressed, Hopeless 0 0 0  PHQ - 2 Score 0 0 0     Social History   Tobacco Use  Smoking Status Never  Smokeless Tobacco Never   BP Readings from Last 3 Encounters:  05/08/22 120/68  05/01/22 118/66  04/11/22 120/64   Pulse Readings from Last 3 Encounters:  05/08/22 75  05/01/22 79  04/11/22 76   Wt Readings from Last 3 Encounters:  05/08/22 204 lb 6 oz (92.7 kg)  05/01/22 203 lb 6.4 oz (92.3 kg)  04/11/22 202 lb 9.6 oz (91.9 kg)   BMI Readings from Last 3 Encounters:  05/08/22 31.08 kg/m  05/01/22 30.93 kg/m  04/11/22 30.81 kg/m    Allergies  Allergen Reactions  Lipitor [Atorvastatin] Other (See Comments)    Myopathy Spring 2006    Pravastatin Other (See Comments)    LEG CRAMPS   Simvastatin Other (See Comments)    LEG CRAMPS    Medications Reviewed Today     Reviewed by Binnie Kand, LPN (Licensed Practical Nurse) on 05/08/22 at [phone]  Med List Status: <None>   Medication Order Taking? Sig Documenting Provider Last Dose Status Informant  albuterol (VENTOLIN HFA) 108 (90 Base) MCG/ACT inhaler [id number] Yes Inhale 2 puffs into the lungs every 6 (six) hours as needed for wheezing or shortness of breath. Copland, Karleen Hampshire, MD Taking Active Self  carvedilol (COREG) 6.25 MG tablet [id number] Yes TAKE 1 TABLET BY MOUTH TWICE A DAY WITH MEAL Corky Crafts, MD Taking Active Self  Continuous Blood Gluc Sensor (FREESTYLE LIBRE 3 SENSOR) [place] [id number] Yes Place 1 sensor on the skin every 14 days. Use to check glucose continuously Doreene Nest, NP Taking Active Self  dapagliflozin propanediol (FARXIGA) 10 MG TABS tablet [id number] Yes Take 1 tablet (10 mg total) by mouth daily before breakfast. For diabetes. Doreene Nest, NP Taking Active Self           Med Note Marco Collie Apr 17, 2022  3:13 PM) AZ&Me PAP 2024  ELIQUIS 5 MG TABS tablet [id number] Yes TAKE 1 TABLET BY MOUTH TWICE A DAY Lanier Prude, MD Taking Active   insulin aspart (NOVOLOG FLEXPEN) 100 UNIT/ML FlexPen [id number] Yes Inject 20 Units into the skin 3 (three) times daily with meals. [provider] Taking Active            Med Note Marco Collie Apr 17, 2022  3:13 PM) Novo Cares PAP  insulin degludec (TRESIBA FLEXTOUCH) 200 UNIT/ML FlexTouch Pen [id number] Yes Inject 100 Units into the skin daily. Via [employer] PAP  Patient taking differently: Inject 80 Units into the skin daily. Via Novo Cares PAP   Doreene Nest, NP Taking Active Self           Med Note Marco Collie Apr 17, 2022  3:13 PM) Novo Cares PAP  Insulin Pen Needle 31G X 8 MM MISC [id number] Yes Use as directed with Lantus.  Doreene Nest, NP Taking Active Self  isosorbide mononitrate (IMDUR) 30 MG 24 hr tablet [id number] Yes TAKE 1 TABLET BY MOUTH EVERY DAY Corky Crafts, MD Taking Active Self  nitroGLYCERIN (NITROSTAT) 0.4 MG SL tablet [id number] Yes Place 1 tablet (0.4 mg total) under the tongue every 5 (five) minutes as needed for chest pain. Corky Crafts, MD Taking Active Self  ondansetron (ZOFRAN-ODT) 4 MG disintegrating tablet [id number] Yes Take 1 tablet (4 mg total) by mouth every 8 (eight) hours as needed for nausea or vomiting. Doreene Nest, NP Taking Active Self  OVER THE COUNTER MEDICATION [id number] Yes Place 1 drop into both eyes 2 (two) times daily. Happy body  liquid msm eye drops/ for floaters in the eyes [provider] Taking Active Self  sacubitril-valsartan (ENTRESTO) 24-26 MG [id number] Yes Take 1 tablet by mouth 2 (two) times daily. Sheilah Pigeon, PA-C Taking Active   Semaglutide, 2 MG/DOSE, (OZEMPIC, 2 MG/DOSE,) 8 MG/3ML SOPN [id number] Yes Inject 2 mg into the skin once a week. [provider] Taking Active            Med Note Marco Collie Apr 17, 2022  3:14  PM) Novo Cares PAP  tamsulosin (FLOMAX) 0.4 MG CAPS capsule [id number] Yes TAKE 1 CAPSULE (0.4 MG TOTAL) BY MOUTH DAILY. FOR URINE FLOW. Doreene Nest, NP Taking Active             SDOH:  (Social Determinants of Health) assessments and interventions performed: No SDOH Interventions    Flowsheet Row Clinical Support from 01/11/2022 in [facility] Chronic Care Management from 12/21/2020 in [facility] at [facility] Clinical Support from 09/13/2018 in [facility] at [facility] Clinical Support from 08/24/2016 in [facility] [place]  SDOH Interventions      Food Insecurity Interventions Intervention Not Indicated -- -- --  Housing Interventions Intervention Not Indicated -- --  --  Transportation Interventions Intervention Not Indicated -- -- --  Utilities Interventions Intervention Not Indicated -- -- --  Depression Interventions/Treatment  -- -- WGN5-6 Score <4 Follow-up Not Indicated --  [pt is being referred to PCP for further evaluation]  Financial Strain Interventions Intervention Not Indicated Other (Comment)  [Farxiga - PAP,  Eliquis - Pt to let us know when he goes into coverage gap in 2023.] -- --  Physical Activity Interventions Intervention Not Indicated -- -- --  Stress Interventions Intervention Not Indicated -- -- --  Social Connections Interventions Intervention Not Indicated -- -- --       Medication Assistance:  Novo Cares - Ozempic (approved through 01/02/23) Novo Cares - Weed, Novolog approved 2024 AZ&Me - [name] (approved through 01/02/23)  Medication Access: Within the past 30 days, how often has patient missed a dose of medication? 0 Is a pillbox or other method used to improve adherence? Yes  Factors that may affect medication adherence? lack of understanding of disease management Are meds synced by current pharmacy? No  Are meds delivered by current pharmacy? No  Does patient experience delays in picking up medications due to transportation concerns? No   [facility] Services Reviewed: Is patient disadvantaged to use [facility]?: No  Current Rx insurance plan: [employer] Name and location of Current pharmacy:  [phone] - [place], [place] - [address] [place] [address] Phone: [phone] Fax: [phone]  [facility] Pharm[facility] services reviewed with patient today?: No  Patient requests to transfer care to [facility]?: No  Reason patient declined to change pharmacies: Not mentioned at this visit  Compliance/Adherence/Medication fill history: Care Gaps: Eye exam (due 11/2020)  Star-Rating Drugs: Farxiga -PAP Ozempic - PAP   Assessment/Plan  Hypertension / Heart Failure (BP goal  <130/80) -Controlled - per clinic readings -Last ejection fraction: 25-30% (Date: 02/2022) -HF type: HFrEF (EF < 40%); NYHA Class: I (no actitivty limitation) -Current treatment: Carvedilol 6.25 mg BID - Appropriate, Effective, Safe, Accessible Isosorbide Mononitrate 30 mg daily - Appropriate, Effective, Safe, Accessible Entresto 24-26 mg BID -Appropriate, Effective, Safe, Accessible Farxiga 10 mg daily -Appropriate, Effective, Safe, Accessible -Medications previously tried: none  -Current home readings: none reported -Denies hypotensive/hypertensive symptoms; Denies chest pain, SOB, dizziness, falls. -Educated on importance of daily adherence; reviewed refill history -Counseled to monitor BP at home if symptomatic, document, and provide log at future appointments -Recommended to continue current medication  Hyperlipidemia: (LDL goal < 70) -Not ideally controlled - LDL 125 (05/2021) off of cholesterol meds -Hx CAD (CABG 2016); follows with cardiology (Dr Eldridge Dace); he has discussed PSCK9-inhibitors with cardiology pharmacist and deferred due to multiple injections -No aspirin due to Eliquis -Current treatment: Nitroglycerin 0.4  mg SL PRN -Medications previously tried: pravastatin 20 mg, rosuvastatin 10 mg TIW, simvastatin 10 mg TIW, atorvastatin, ezetimibe -Educated on Cholesterol goals;  -Consider Leqvio   Diabetes (A1c goal <7%) -Uncontrolled, improving - A1c 9.3% (3/24), pt is now taking Tresiba 80 units daily and Novolog 20 units "at night", not taking with other meals; He is taking Ozempic 1 mg on Sundays, he has not started 2 mg of Ozempic despite multiple instructions due to do so -Meal patterns: pt reports eating 3 meals per day -Current home glucose readings: Freestyle Libre 2 > upgraded to [place] 05 March 2022 Reviewed AGP report: 04/25/22 to 05/08/22. Sensor active: 97%  Time in range (70-180): 26% (goal > 70%)  High (180-250): 30%  Very high (>250): 44%  Low (< 70): 0% (goal <  4%)  GMI: 9.1%; Average glucose: 244   Previous AGP report: 04/11/22 to 04/24/22. Sensor active: 98%  Time in range (70-180): 40% (goal > 70%)  High (180-250): 32%  Very high (>250): 28%  Low (< 70): 0% (goal < 4%)  GMI: 8.3%; Average glucose: 209   -Current medications: Farxiga 10 mg daily (PAP) - Appropriate, Query Effective Insulin Degludec U200 - 80 units daily AM (PAP) - Appropriate, Effective, Safe, Accessible Novolog 20 units w/ meals (PAP) -Appropriate, Effective, Safe, Accessible Ozempic 2 mg weekly (PAP) -Appropriate, Query Effective Freestyle Libre 3 (phone) - Appropriate, Effective, Safe, Accessible -Medications previously tried: glipizide (hypoglycemia) -Reviewed CGM report - improvement since last week  -Advised to increase Ozempic to 2 mg starting next week (take 1 mg x 2 injections at once until 1 mg pens runs out, then start with 2 mg pens x 1 injection per week)  Atrial Fibrillation (Goal: prevent stroke and major bleeding) -Followed by cardiology -CHADSVASC: 6 -Reviewed adherence: Eliquis PDC 53% less than ideal - pt rations Eliquis during the donut hole (Oct-Dec typically) -Current treatment: Carvedilol 6.25 mg BID - Appropriate, Effective, Safe, Accessible Eliquis 5 mg BID -Appropriate, Effective, Safe, Accessible -Medications previously tried: none reported -Home BP and HR readings: none reported -Counseled on increased risk of stroke due to Afib and benefits of anticoagulation for stroke prevention; importance of adherence to anticoagulant exactly as prescribed; avoidance of NSAIDs due to increased bleeding risk with anticoagulants; -Recommended to continue current medication  Health Maintenance -BPH: on tamsulosin   Al Corpus, PharmD, Patsy [place], CPP Clinical Pharmacist Practitioner [facility] at [facility] [phone]

## 2022-05-09 NOTE — Progress Notes (Signed)
Remote ICD transmission.   

## 2022-05-16 ENCOUNTER — Telehealth: Payer: Self-pay | Admitting: *Deleted

## 2022-05-16 ENCOUNTER — Other Ambulatory Visit: Payer: Self-pay | Admitting: Interventional Cardiology

## 2022-05-16 NOTE — Telephone Encounter (Signed)
Called pt to confirm medication refills. Lisinopril has been d/c will fill coreg to requested pharmacy.

## 2022-05-18 ENCOUNTER — Telehealth: Payer: Self-pay

## 2022-05-18 NOTE — Telephone Encounter (Signed)
-----   Message from Garry Heater [place], [facility] sent at 05/12/2022  8:13 AM EDT ----- Regarding: call pt Please call pt, remind him to take 2 mg of Ozempic this week (if he is using 1 mg pen, take 2 doses back to back. If he is using 2 mg pen - 1 dose).  Also remind him to take 20 units of Novolog before all meals (not at bedtime)  He usually answers the phone around 3 pm.

## 2022-05-18 NOTE — Progress Notes (Signed)
Called patient; no answer; left message.  Lindsey Foltanski, PharmD notified  Sakeena Teall, RMA Clinical Pharmacy Assistant [phone]   

## 2022-05-22 ENCOUNTER — Telehealth: Payer: Self-pay

## 2022-05-22 NOTE — Progress Notes (Signed)
Care Management & Coordination Services Pharmacy Team  Reason for Encounter: Appointment Reminder  Contacted patient to confirm telephone appointment with Al Corpus, PharmD on 05/25/2022 at 3:00.  Spoke with patient on 05/22/2022   Do you have any problems getting your medications? No  What is your top health concern you would like to discuss at your upcoming visit?  No concerns right now  Have you seen any other providers since your last visit with PCP? Yes  Star Rating Drugs:  Medication:    Last Fill: Day Supply Farxiga 10 mg    PAP Sacubitril-Valsartan 24-26 mg 05/08/2022 15 Ozempic 2 mg    PAP  Care Gaps: Annual wellness visit in last year? Yes 01/11/2022  If Diabetic: Last eye exam / retinopathy screening: Overdue Last diabetic foot exam: Up to date  Al Corpus, PharmD notified  Claudina Lick, [place] Clinical Pharmacy Assistant [phone]

## 2022-05-25 ENCOUNTER — Ambulatory Visit: Payer: Medicare Other | Admitting: Pharmacist

## 2022-05-25 NOTE — Progress Notes (Signed)
Care Management & Coordination Services Pharmacy Note  05/25/2022 Name:  CARLOSDANIEL [name] MRN:  [id number] DOB:  Sep 15, 1945  Summary: F/U visit -DM - A1c 9.3% (03/2022), pt is now taking Tresiba 80 units daily and Novolog 20 units with all 3 meals. He is taking Ozempic 1 mg x2 doses (2mg  total) on Sundays, he has had a few episodes of glucose < 70 but the CGM did alarm and he was able to treat before it got too low Reviewed AGP report: 05/12/22 to 05/25/22. Sensor active: 84%  Time in range (70-180): 46% (improved from 26%)  High (180-250): 30%  Very high (>250): 23% (improved from 44%)  Low (< 70): 1%   GMI: 8.1% (improved from 9.1%); Average glucose: 201(improved from 244)  Recommendations/Changes made from today's visit: -Continue same regimen; advised to eat a snack before bed if glucose is < 100  Follow up plan: -Pharmacist follow up televisit scheduled for 4 weeks -PCP appt 06/29/22    Subjective: [name] is an 77 y.o. year old male who is a primary patient of Doreene Nest, NP.  The care coordination team was consulted for assistance with disease management and care coordination needs.    Engaged with patient by telephone for follow up visit.  Recent office visits: 05/08/22 Dr Patsy Lager OV: back pain - rx gabapentin 300 mg TID. Rx prednisone 40 mg x 1 week, 20 mg x 1 week  03/29/22 NP Mayra Reel OV: f/u - A1c 9.3%; increase Ozempic to 1 mg; declined nutrition and endo referral  Recent consult visits: 05/01/22 Dr Eldridge Dace (Cardiology): f/u - intolerant to statins/zetia. Did not want PCSK9. No changes.  04/11/22 Dr Lalla Brothers (Cardiology): AF - monitor for now. If recurrence, favor Tikosyn over amiodarone.   ECHO 02/13/22: EF 25-30%. Stop lisinopril, start Entresto 3 days later.  01/31/22 PA Keitha Butte (Cardiology): f/u - persistent afib, 100% burden. Consider amiodarone or Tikosyn - order Echo. Schedule DCCV. 2/14.  Hospital visits: 02/21/22 planned admission -  DCCV.   Objective:  Lab Results  Component Value Date   CREATININE 1.13 01/31/2022   BUN 15 01/31/2022   GFR 53.87 (L) 11/30/2021   EGFR 67 01/31/2022   GFRNONAA >60 01/07/2019   GFRAA >60 01/07/2019   NA 135 01/31/2022   K 4.3 01/31/2022   CALCIUM 9.3 01/31/2022   CO2 20 01/31/2022   GLUCOSE 324 (H) 01/31/2022    Lab Results  Component Value Date/Time   HGBA1C 9.3 (A) 03/29/2022 07:41 AM   HGBA1C 8.1 (A) 12/22/2021 10:38 AM   HGBA1C 9.2 (H) 01/06/2019 01:51 AM   HGBA1C 9.5 (H) 09/13/2018 09:04 AM   GFR 53.87 (L) 11/30/2021 03:17 PM   GFR 75.44 12/07/2020 08:35 AM   MICROALBUR 1.6 12/22/2021 11:10 AM   MICROALBUR 1.6 11/04/2015 08:26 AM    Last diabetic Eye exam:  Lab Results  Component Value Date/Time   HMDIABEYEEXA Retinopathy (A) 11/26/2019 12:00 AM    Last diabetic Foot exam: No results found for: "HMDIABFOOTEX"   Lab Results  Component Value Date   CHOL 176 05/04/2021   HDL 44 05/04/2021   LDLCALC 125 (H) 05/04/2021   TRIG 35 05/04/2021   CHOLHDL 4.0 05/04/2021       Latest Ref Rng & Units 03/30/2022    8:46 AM 05/04/2021    9:12 AM 12/07/2020    8:35 AM  Hepatic Function  Total Protein 6.0 - 8.3 g/dL 7.1  7.0  7.3   Albumin 3.5 - 5.2 g/dL  4.2  4.4  4.1   AST 0 - 37 U/L 22  33  48   ALT 0 - 53 U/L 32  42  114   Alk Phosphatase 39 - 117 U/L 70  107  114   Total Bilirubin 0.2 - 1.2 mg/dL 0.7  1.0  3.1   Bilirubin, Direct 0.0 - 0.3 mg/dL 0.1       Lab Results  Component Value Date/Time   TSH 1.75 03/30/2022 08:46 AM   TSH 1.529 01/06/2019 10:40 AM   TSH 0.748 06/18/2014 04:57 AM       Latest Ref Rng & Units 01/31/2022   11:49 AM 11/30/2021    3:17 PM 05/04/2021    9:12 AM  CBC  WBC 3.4 - 10.8 x10E3/uL 7.6  13.6  7.0   Hemoglobin 13.0 - 17.7 g/dL 16.1  09.6  04.5   Hematocrit 37.5 - 51.0 % 46.4  52.5  42.1   Platelets 150 - 450 x10E3/uL 242  259.0  193     No results found for: "VD25OH", "VITAMINB12"  Clinical ASCVD: Yes  The ASCVD Risk  score (Arnett DK, et al., 2019) failed to calculate for the following reasons:   The patient has a prior MI or stroke diagnosis    CHA2DS2/VAS Stroke Risk Points  Current as of 30 minutes ago     6 >= 2 Points: High Risk    Points Metrics  1 Has Congestive Heart Failure:  Yes    Current as of 30 minutes ago  1 Has Vascular Disease:  Yes    Current as of 30 minutes ago  1 Has Hypertension:  Yes    Current as of 30 minutes ago  2 Age:  50    Current as of 30 minutes ago  1 Has Diabetes:  Yes    Current as of 30 minutes ago  0 Had Stroke:  No  Had TIA:  No  Had Thromboembolism:  No    Current as of 30 minutes ago  0 Male:  No    Current as of 30 minutes ago        03/29/2022    7:26 AM 01/11/2022    3:22 PM 01/07/2021    3:34 PM  Depression screen PHQ 2/9  Decreased Interest 0 0 0  Down, Depressed, Hopeless 0 0 0  PHQ - 2 Score 0 0 0     Social History   Tobacco Use  Smoking Status Never  Smokeless Tobacco Never   BP Readings from Last 3 Encounters:  05/08/22 120/68  05/01/22 118/66  04/11/22 120/64   Pulse Readings from Last 3 Encounters:  05/08/22 75  05/01/22 79  04/11/22 76   Wt Readings from Last 3 Encounters:  05/08/22 204 lb 6 oz (92.7 kg)  05/01/22 203 lb 6.4 oz (92.3 kg)  04/11/22 202 lb 9.6 oz (91.9 kg)   BMI Readings from Last 3 Encounters:  05/08/22 31.08 kg/m  05/01/22 30.93 kg/m  04/11/22 30.81 kg/m    Allergies  Allergen Reactions   Lipitor [Atorvastatin] Other (See Comments)    Myopathy Spring 2006   Pravastatin Other (See Comments)    LEG CRAMPS   Simvastatin Other (See Comments)    LEG CRAMPS    Medications Reviewed Today     Reviewed by Kathyrn Sheriff, [facility] (Pharmacist) on 05/25/22 at 1514  Med List Status: <None>   Medication Order Taking? Sig Documenting Provider Last Dose Status Informant  albuterol (VENTOLIN  HFA) 108 (90 Base) MCG/ACT inhaler [id number] Yes Inhale 2 puffs into the lungs every 6 (six) hours as needed  for wheezing or shortness of breath. Hannah Beat, MD Taking Active Self  carvedilol (COREG) 6.25 MG tablet [id number] Yes TAKE 1 TABLET BY MOUTH TWICE A DAY WITH FOOD Corky Crafts, MD Taking Active   Continuous Blood Gluc Sensor (FREESTYLE LIBRE 3 SENSOR) [place] [id number] Yes Place 1 sensor on the skin every 14 days. Use to check glucose continuously Doreene Nest, NP Taking Active Self  dapagliflozin propanediol (FARXIGA) 10 MG TABS tablet [id number] Yes Take 1 tablet (10 mg total) by mouth daily before breakfast. For diabetes. Doreene Nest, NP Taking Active Self           Med Note Marco Collie Apr 17, 2022  3:13 PM) AZ&Me PAP 2024  ELIQUIS 5 MG TABS tablet [id number] Yes TAKE 1 TABLET BY MOUTH TWICE A DAY Lanier Prude, MD Taking Active   gabapentin (NEURONTIN) 300 MG capsule [id number] Yes Take 1 capsule (300 mg total) by mouth 3 (three) times daily. Copland, Karleen Hampshire, MD Taking Active   insulin aspart (NOVOLOG FLEXPEN) 100 UNIT/ML FlexPen [id number] Yes Inject 20 Units into the skin 3 (three) times daily with meals. [provider] Taking Active            Med Note Marco Collie Apr 17, 2022  3:13 PM) Novo Cares PAP  insulin degludec (TRESIBA FLEXTOUCH) 200 UNIT/ML FlexTouch Pen [id number] Yes Inject 100 Units into the skin daily. Via [employer] PAP  Patient taking differently: Inject 80 Units into the skin daily. Via Novo Cares PAP   Doreene Nest, NP Taking Active Self           Med Note Marco Collie Apr 17, 2022  3:13 PM) Novo Cares PAP  Insulin Pen Needle 31G X 8 MM MISC [id number] Yes Use as directed with Lantus. Doreene Nest, NP Taking Active Self  isosorbide mononitrate (IMDUR) 30 MG 24 hr tablet [id number] Yes TAKE 1 TABLET BY MOUTH EVERY DAY Corky Crafts, MD Taking Active Self  nitroGLYCERIN (NITROSTAT) 0.4 MG SL tablet [id number] Yes Place 1 tablet (0.4 mg total) under the tongue every 5 (five)  minutes as needed for chest pain. Corky Crafts, MD Taking Active Self  ondansetron (ZOFRAN-ODT) 4 MG disintegrating tablet [id number] Yes Take 1 tablet (4 mg total) by mouth every 8 (eight) hours as needed for nausea or vomiting. Doreene Nest, NP Taking Active Self  OVER THE COUNTER MEDICATION [id number] Yes Place 1 drop into both eyes 2 (two) times daily. Happy body  liquid msm eye drops/ for floaters in the eyes [provider] Taking Active Self  predniSONE (DELTASONE) 20 MG tablet [id number] Yes 2 tabs po daily for 5 days, then 1 tab po daily for 5 days Copland, Spencer, MD Taking Active   sacubitril-valsartan (ENTRESTO) 24-26 MG [id number] Yes Take 1 tablet by mouth 2 (two) times daily. Sheilah Pigeon, PA-C Taking Active   Semaglutide, 2 MG/DOSE, (OZEMPIC, 2 MG/DOSE,) 8 MG/3ML SOPN [id number] Yes Inject 2 mg into the skin once a week. [provider] Taking Active            Med Note Marco Collie Apr 17, 2022  3:14 PM) Novo Cares PAP  tamsulosin (FLOMAX) 0.4 MG CAPS capsule [id number] Yes TAKE 1 CAPSULE (0.4 MG TOTAL) BY MOUTH  DAILY. FOR URINE FLOW. Doreene Nest, NP Taking Active             SDOH:  (Social Determinants of Health) assessments and interventions performed: No SDOH Interventions    Flowsheet Row Clinical Support from 01/11/2022 in [facility] Chronic Care Management from 12/21/2020 in [facility] Clinical Support from 09/13/2018 in [facility] Clinical Support from 08/24/2016 in [facility] [place]  SDOH Interventions      Food Insecurity Interventions Intervention Not Indicated -- -- --  Housing Interventions Intervention Not Indicated -- -- --  Transportation Interventions Intervention Not Indicated -- -- --  Utilities Interventions Intervention Not Indicated -- -- --  Depression  Interventions/Treatment  -- -- ZOX0-9 Score <4 Follow-up Not Indicated --  [pt is being referred to PCP for further evaluation]  Financial Strain Interventions Intervention Not Indicated Other (Comment)  [Farxiga - PAP,  Eliquis - Pt to let us know when he goes into coverage gap in 2023.] -- --  Physical Activity Interventions Intervention Not Indicated -- -- --  Stress Interventions Intervention Not Indicated -- -- --  Social Connections Interventions Intervention Not Indicated -- -- --       Medication Assistance:  Novo Cares - Ozempic (approved through 01/02/23) Novo Cares - [place], Novolog approved 2024 AZ&Me - [name] (approved through 01/02/23)  Medication Access: Within the past 30 days, how often has patient missed a dose of medication? 0 Is a pillbox or other method used to improve adherence? Yes  Factors that may affect medication adherence? lack of understanding of disease management Are meds synced by current pharmacy? No  Are meds delivered by current pharmacy? No  Does patient experience delays in picking up medications due to transportation concerns? No   [facility] Services Reviewed: Is patient disadvantaged to use [facility]?: No  Current Rx insurance plan: [facility] Name and location of Current pharmacy:  [facility] [phone] - [place], [place] - [address] [place] [address] Phone: [phone] Fax: [phone]  [facility] services reviewed with patient today?: No  Patient requests to transfer care to [facility]?: No  Reason patient declined to change pharmacies: Not mentioned at this visit  Compliance/Adherence/Medication fill history: Care Gaps: Eye exam (due 11/2020)  Star-Rating Drugs: Farxiga -PAP Ozempic - PAP   Assessment/Plan  Hypertension / Heart Failure (BP goal <130/80) -Controlled - per clinic readings -Last ejection fraction: 25-30% (Date: 02/2022) -HF type: HFrEF (EF < 40%); NYHA Class: I (no  actitivty limitation) -Current treatment: Carvedilol 6.25 mg BID - Appropriate, Effective, Safe, Accessible Isosorbide Mononitrate 30 mg daily - Appropriate, Effective, Safe, Accessible Entresto 24-26 mg BID -Appropriate, Effective, Safe, Accessible Farxiga 10 mg daily -Appropriate, Effective, Safe, Accessible -Medications previously tried: none  -Current home readings: none reported -Denies hypotensive/hypertensive symptoms; Denies chest pain, SOB, dizziness, falls. -Educated on importance of daily adherence; reviewed refill history -Counseled to monitor BP at home if symptomatic, document, and provide log at future appointments -Recommended to continue current medication  Hyperlipidemia: (LDL goal < 70) -Not ideally controlled - LDL 125 (05/2021) off of cholesterol meds -Hx CAD (CABG 2016); follows with cardiology (Dr Eldridge Dace); he has discussed PSCK9-inhibitors with cardiology pharmacist and deferred due to multiple injections -No aspirin due to Eliquis -Current treatment: Nitroglycerin 0.4 mg SL PRN -Medications previously tried: pravastatin 20 mg, rosuvastatin 10 mg TIW, simvastatin 10 mg TIW, atorvastatin, ezetimibe -Educated on  Cholesterol goals;  -Consider Leqvio   Diabetes (A1c goal <7%) -Uncontrolled, improving - A1c 9.3% (3/24), pt is now taking Tresiba 80 units daily and Novolog 20 units with all 3 meals. He is taking Ozempic 1 mg x2 doses (2mg  total) on Sundays, he has had a few episodes of glucose < 70 but the CGM did alarm and he was able to treat before it got too low -Meal patterns: pt reports eating 3 meals per day -Current home glucose readings: Freestyle Libre 2 > upgraded to [place] 05 March 2022 Reviewed AGP report: 05/12/22 to 05/25/22. Sensor active: 84%  Time in range (70-180): 46% (goal > 70%)  High (180-250): 30%  Very high (>250): 23%  Low (< 70): 1% (goal < 4%)  GMI: 8.1%; Average glucose: 201   Previous AGP report: 04/25/22 to 05/08/22. Sensor active:  97%  Time in range (70-180): 26% (goal > 70%)  High (180-250): 30%  Very high (>250): 44%  Low (< 70): 0% (goal < 4%)  GMI: 9.1%; Average glucose: 244   -Current medications: Farxiga 10 mg daily (PAP) - Appropriate, Query Effective Insulin Degludec U200 - 80 units daily AM (PAP) - Appropriate, Effective, Safe, Accessible Novolog 20 units w/ meals (PAP) -Appropriate, Effective, Safe, Accessible Ozempic 2 mg weekly (PAP) -Appropriate, Query Effective Freestyle Libre 3 (phone) - Appropriate, Effective, Safe, Accessible -Medications previously tried: glipizide (hypoglycemia) -Reviewed CGM report - improvement since last week  -Recommend to continue current medication; advised to eat a snack at bedtime if glucose is < 100  Atrial Fibrillation (Goal: prevent stroke and major bleeding) -Followed by cardiology -CHADSVASC: 6 -Reviewed adherence: Eliquis PDC 53% less than ideal - pt rations Eliquis during the donut hole (Oct-Dec typically) -Current treatment: Carvedilol 6.25 mg BID - Appropriate, Effective, Safe, Accessible Eliquis 5 mg BID -Appropriate, Effective, Safe, Accessible -Medications previously tried: none reported -Home BP and HR readings: none reported -Counseled on increased risk of stroke due to Afib and benefits of anticoagulation for stroke prevention; importance of adherence to anticoagulant exactly as prescribed; avoidance of NSAIDs due to increased bleeding risk with anticoagulants; -Recommended to continue current medication  Health Maintenance -BPH: on tamsulosin   Al Corpus, PharmD, Patsy Baltimore, CPP Clinical Pharmacist Practitioner [facility] at [facility] [phone]

## 2022-06-08 ENCOUNTER — Telehealth: Payer: Self-pay

## 2022-06-08 NOTE — Telephone Encounter (Signed)
Patient came by to pick up medication.  

## 2022-06-08 NOTE — Telephone Encounter (Signed)
Received patient assistance medication Novolog flexpen x 6 boxes.  Patient is aware.  Placed in middle fridge.

## 2022-06-20 ENCOUNTER — Encounter: Payer: Medicare Other | Admitting: Pharmacist

## 2022-06-29 ENCOUNTER — Encounter: Payer: Self-pay | Admitting: Primary Care

## 2022-06-29 ENCOUNTER — Ambulatory Visit (INDEPENDENT_AMBULATORY_CARE_PROVIDER_SITE_OTHER): Payer: Medicare Other | Admitting: Primary Care

## 2022-06-29 VITALS — BP 108/56 | HR 80 | Temp 98.3°F | Ht 68.0 in | Wt 212.0 lb

## 2022-06-29 DIAGNOSIS — Z794 Long term (current) use of insulin: Secondary | ICD-10-CM | POA: Diagnosis not present

## 2022-06-29 DIAGNOSIS — E1122 Type 2 diabetes mellitus with diabetic chronic kidney disease: Secondary | ICD-10-CM

## 2022-06-29 DIAGNOSIS — N182 Chronic kidney disease, stage 2 (mild): Secondary | ICD-10-CM | POA: Diagnosis not present

## 2022-06-29 LAB — POCT GLYCOSYLATED HEMOGLOBIN (HGB A1C): Hemoglobin A1C: 8.1 % — AB (ref 4.0–5.6)

## 2022-06-29 NOTE — Assessment & Plan Note (Signed)
Improved with A1C of 8.1 but still above goal.   He did not bring his medications with him today.  He does seem to understand his regimen now.  He does continue to experience nearly daily hypoglycemic episodes. We discussed that his choice of snack is an appropriate for his episodes.  Protein bar or known sugary carb is preferred.  He will continue to follow closely with our pharmacy team.  Continue Ozempic 2 mg weekly, Farxiga 10 mg daily, Tresiba 80 units daily, NovoLog 20 units 3 times daily with meals.  Follow-up in 3 months.

## 2022-06-29 NOTE — Patient Instructions (Signed)
Avoid eating candy bars and cereal for low blood sugar.  Switch to a protein bar or other protein snack.  Avoid starchy foods such as rice, potatoes, pasta.  Increase water intake.  Our pharmacist will reach out to you shortly.  Please schedule a follow up visit for 3 months.  It was a pleasure to see you today!

## 2022-06-29 NOTE — Progress Notes (Signed)
Subjective:    Patient ID: [name], male    DOB: August 06, 1945, 77 y.o.   MRN: [id number]  HPI  [name] is a very pleasant 77 y.o. male with a significant medical history including CAD, cardiomyopathy, ventricular tachycardia, ICD implantation, type 2 diabetes, hyperlipidemia, who presents today for follow-up of diabetes.  Current medications include: Tresiba 80 units daily, NovoLog 20 units 3 times daily with meals, Ozempic 2 mg weekly, Farxiga 10 mg daily.  He has been following closely in our clinic and with our pharmacist for better glucose control.    He is checking his blood glucose continuously and is getting readings of:  AM fasting: 140's Before Lunch: 140's Bedtime: 150-200  He continues to experience hypoglycemia episodes during the middle of the night in the mid to low 60's. He also drops nearly everyday after lunch in the mid 60's. He will eat a candy bar when his glucose is dropping. He eats a bowl of cereal each night if his glucose is <100.   Diet currently consists of:  Breakfast: Sausage, egg, and cheese biscuits  Lunch: Sandwich and chips Dinner: Fast food, meat, vegetables, starch Snacks: Candy bars, cereal  Desserts: Ice cream, ice cream bars Beverages: Diet Coke, some water  Exercise: Active   Last A1C: 9.3 in March 2024, 8.1 today. Last Eye Exam: Due Last Foot Exam: Up-to-date Pneumonia Vaccination: 2018 Urine Microalbumin: Up-to-date Statin: None.    BP Readings from Last 3 Encounters:  06/29/22 (!) 108/56  05/08/22 120/68  05/01/22 118/66     Review of Systems  Respiratory:  Negative for shortness of breath.   Cardiovascular:  Negative for chest pain.  Musculoskeletal:  Positive for arthralgias.  Neurological:  Negative for dizziness and numbness.         Past Medical History:  Diagnosis Date   Arthritis    "fingers, shoulders" (08/03/2015)   Atrial fibrillation (HCC)    Paroxysmal, rare episodes, sinus rhythm on  flecainide, patient prefers not to take Coumadin   Bradycardia    April, 2013   Chest pain    Nuclear, 2006, no ischemia   Chronic lower back pain    "all my life"   Coronary artery disease    s/p staged cath 05/12/2014 and 5/11, DES x 2 to heavily calcified RCA, residual with 60% prox LAD, 70% D1   Ejection fraction    EF 55-60%,  echo, January, 2011   Elevated CPK    CPK elevated with normal MB and normal troponin the past   Heart murmur    History of echocardiogram    Echo 8/16: EF 55%, normal wall motion, grade 2 diastolic dysfunction, trivial MR, normal RV function, PASP 27 mmHg   Hypertension    IBS (irritable bowel syndrome)    Myocardial infarction (HCC) 06/2014   Sinus drainage    Type II diabetes mellitus (HCC)     Social History   Socioeconomic History   Marital status: Married    Spouse name: Not on file   Number of children: Not on file   Years of education: Not on file   Highest education level: Not on file  Occupational History   Not on file  Tobacco Use   Smoking status: Never   Smokeless tobacco: Never  Vaping Use   Vaping Use: Never used  Substance and Sexual Activity   Alcohol use: No   Drug use: No   Sexual activity: Not on file  Other Topics Concern  Not on file  Social History Narrative   Not on file   Social Determinants of Health   Financial Resource Strain: Low Risk  (01/11/2022)   Overall Financial Resource Strain (CARDIA)    Difficulty of Paying Living Expenses: Not hard at all  Food Insecurity: No Food Insecurity (01/11/2022)   Hunger Vital Sign    Worried About Running Out of Food in the Last Year: Never true    Ran Out of Food in the Last Year: Never true  Transportation Needs: No Transportation Needs (01/11/2022)   PRAPARE - Administrator, Civil Service (Medical): No    Lack of Transportation (Non-Medical): No  Physical Activity: Sufficiently Active (01/11/2022)   Exercise Vital Sign    Days of Exercise per Week: 3  days    Minutes of Exercise per Session: 150+ min  Stress: No Stress Concern Present (01/11/2022)   [employer] of Occupational Health - Occupational Stress Questionnaire    Feeling of Stress : Not at all  Social Connections: Socially Integrated (01/11/2022)   Social Connection and Isolation Panel [NHANES]    Frequency of Communication with Friends and Family: More than three times a week    Frequency of Social Gatherings with Friends and Family: More than three times a week    Attends Religious Services: More than 4 times per year    Active Member of Clubs or Organizations: Yes    Attends Banker Meetings: More than 4 times per year    Marital Status: Married  Catering manager Violence: Not At Risk (01/11/2022)   Humiliation, Afraid, Rape, and Kick questionnaire    Fear of Current or Ex-Partner: No    Emotionally Abused: No    Physically Abused: No    Sexually Abused: No    Past Surgical History:  Procedure Laterality Date   APPENDECTOMY     CARDIAC CATHETERIZATION N/A 05/12/2014   Procedure: Left Heart Cath and Coronary Angiography;  Surgeon: Lennette Bihari, MD;  Location: [facility];  Service: Cardiovascular;  Laterality: N/A;   CARDIAC CATHETERIZATION N/A 05/13/2014   Procedure: Coronary/Graft Atherectomy;  Surgeon: Lennette Bihari, MD;  Location: [facility];  Service: Cardiovascular;  Laterality: N/A;   CARDIAC CATHETERIZATION Right 05/13/2014   Procedure: Temporary Pacemaker;  Surgeon: Lennette Bihari, MD;  Location: [facility];  Service: Cardiovascular;  Laterality: Right;   CARDIAC CATHETERIZATION N/A 05/13/2014   Procedure: Coronary Stent Intervention;  Surgeon: Lennette Bihari, MD;  Location: [facility];  Service: Cardiovascular;  Laterality: N/A;   CARDIAC CATHETERIZATION N/A 06/18/2014   Procedure: Left Heart Cath and Coronary Angiography;  Surgeon: Peter M Swaziland, MD;  Location: [facility] INVASIVE CV LAB;  Service: Cardiovascular;   Laterality: N/A;   CARDIAC CATHETERIZATION N/A 08/03/2015   Procedure: Left Heart Cath and Cors/Grafts Angiography;  Surgeon: Yvonne Kendall, MD;  Location: [facility] INVASIVE CV LAB;  Service: Cardiovascular;  Laterality: N/A;   CARDIOVASCULAR STRESS TEST  05/06/2014   CARDIOVERSION N/A 02/21/2022   Procedure: CARDIOVERSION;  Surgeon: Meriam Sprague, MD;  Location: [facility] ENDOSCOPY;  Service: Cardiovascular;  Laterality: N/A;   CATARACT EXTRACTION W/ INTRAOCULAR LENS IMPLANT Left    COLONOSCOPY WITH PROPOFOL N/A 02/13/2020   Procedure: COLONOSCOPY WITH PROPOFOL;  Surgeon: Jeani Hawking, MD;  Location: [facility] ENDOSCOPY;  Service: Endoscopy;  Laterality: N/A;   CORONARY ARTERY BYPASS GRAFT N/A 06/22/2014   Procedure: CORONARY ARTERY BYPASS GRAFTING (CABG) x five, using left internal mammary  artery, and right leg greater saphenous vein harvested endoscopically;  Surgeon: Loreli Slot, MD;  Location: [facility] OR;  Service: Open Heart Surgery;  Laterality: N/A;   ICD IMPLANT N/A 01/07/2019   Procedure: ICD IMPLANT;  Surgeon: Hillis Range, MD;  Location: [facility];  Service: Cardiovascular;  Laterality: N/A;   LAPAROSCOPIC CHOLECYSTECTOMY     LEFT HEART CATH AND CORS/GRAFTS ANGIOGRAPHY N/A 01/06/2019   Procedure: LEFT HEART CATH AND CORS/GRAFTS ANGIOGRAPHY;  Surgeon: Yvonne Kendall, MD;  Location: [facility];  Service: Cardiovascular;  Laterality: N/A;   MAZE N/A 06/22/2014   Procedure: MAZE;  Surgeon: Loreli Slot, MD;  Location: [facility] OR;  Service: Open Heart Surgery;  Laterality: N/A;   POLYPECTOMY  02/13/2020   Procedure: POLYPECTOMY;  Surgeon: Jeani Hawking, MD;  Location: [facility] ENDOSCOPY;  Service: Endoscopy;;   TEE WITHOUT CARDIOVERSION N/A 06/22/2014   Procedure: TRANSESOPHAGEAL ECHOCARDIOGRAM (TEE);  Surgeon: Loreli Slot, MD;  Location: [facility] OR;  Service: Open Heart Surgery;  Laterality: N/A;    Family History  Problem Relation Age of Onset   Alzheimer's disease Mother     Emphysema Father    Cancer Brother    Arrhythmia Sister    Heart attack Sister    Heart disease Sister    Hyperlipidemia Sister    Hypertension Sister     Allergies  Allergen Reactions   Lipitor [Atorvastatin] Other (See Comments)    Myopathy Spring 2006   Pravastatin Other (See Comments)    LEG CRAMPS   Simvastatin Other (See Comments)    LEG CRAMPS    Current Outpatient Medications on File Prior to Visit  Medication Sig Dispense Refill   albuterol (VENTOLIN HFA) 108 (90 Base) MCG/ACT inhaler Inhale 2 puffs into the lungs every 6 (six) hours as needed for wheezing or shortness of breath. 8 g 3   carvedilol (COREG) 6.25 MG tablet TAKE 1 TABLET BY MOUTH TWICE A DAY WITH FOOD 180 tablet 1   Continuous Blood Gluc Sensor (FREESTYLE LIBRE 3 SENSOR) MISC Place 1 sensor on the skin every 14 days. Use to check glucose continuously 6 each 1   dapagliflozin propanediol (FARXIGA) 10 MG TABS tablet Take 1 tablet (10 mg total) by mouth daily before breakfast. For diabetes. 90 tablet 1   ELIQUIS 5 MG TABS tablet TAKE 1 TABLET BY MOUTH TWICE A DAY 60 tablet 5   gabapentin (NEURONTIN) 300 MG capsule Take 1 capsule (300 mg total) by mouth 3 (three) times daily. 30 capsule 3   insulin aspart (NOVOLOG FLEXPEN) 100 UNIT/ML FlexPen Inject 20 Units into the skin 3 (three) times daily with meals.     insulin degludec (TRESIBA FLEXTOUCH) 200 UNIT/ML FlexTouch Pen Inject 100 Units into the skin daily. Via [employer] PAP (Patient taking differently: Inject 80 Units into the skin daily. Via Novo Cares PAP) 36 mL 0   Insulin Pen Needle 31G X 8 MM MISC Use as directed with Lantus. 100 each 5   isosorbide mononitrate (IMDUR) 30 MG 24 hr tablet TAKE 1 TABLET BY MOUTH EVERY DAY 90 tablet 2   nitroGLYCERIN (NITROSTAT) 0.4 MG SL tablet Place 1 tablet (0.4 mg total) under the tongue every 5 (five) minutes as needed for chest pain. 25 tablet 6   ondansetron (ZOFRAN-ODT) 4 MG disintegrating tablet Take 1 tablet (4 mg  total) by mouth every 8 (eight) hours as needed for nausea or vomiting. 15 tablet 0   OVER THE COUNTER MEDICATION Place 1 drop  into both eyes 2 (two) times daily. Happy body  liquid msm eye drops/ for floaters in the eyes     sacubitril-valsartan (ENTRESTO) 24-26 MG Take 1 tablet by mouth 2 (two) times daily. 180 tablet 2   Semaglutide, 2 MG/DOSE, (OZEMPIC, 2 MG/DOSE,) 8 MG/3ML SOPN Inject 2 mg into the skin once a week.     tamsulosin (FLOMAX) 0.4 MG CAPS capsule TAKE 1 CAPSULE (0.4 MG TOTAL) BY MOUTH DAILY. FOR URINE FLOW. 90 capsule 3   No current facility-administered medications on file prior to visit.    BP (!) 108/56   Pulse 80   Temp 98.3 F (36.8 C) (Temporal)   Ht 5\' 8"  (1.727 m)   Wt 212 lb (96.2 kg)   SpO2 99%   BMI 32.23 kg/m  Objective:   Physical Exam Cardiovascular:     Rate and Rhythm: Normal rate and regular rhythm.  Pulmonary:     Effort: Pulmonary effort is normal.     Breath sounds: Normal breath sounds. No wheezing or rales.  Musculoskeletal:     Cervical back: Neck supple.  Skin:    General: Skin is warm and dry.  Neurological:     Mental Status: He is alert and oriented to person, place, and time.           Assessment & Plan:  Type 2 diabetes mellitus with stage 2 chronic kidney disease, with long-term current use of insulin (HCC) Assessment & Plan: Improved with A1C of 8.1 but still above goal.   He did not bring his medications with him today.  He does seem to understand his regimen now.  He does continue to experience nearly daily hypoglycemic episodes. We discussed that his choice of snack is an appropriate for his episodes.  Protein bar or known sugary carb is preferred.  He will continue to follow closely with our pharmacy team.  Continue Ozempic 2 mg weekly, Farxiga 10 mg daily, Tresiba 80 units daily, NovoLog 20 units 3 times daily with meals.  Follow-up in 3 months.  Orders: -     POCT glycosylated hemoglobin (Hb  A1C)        Doreene Nest, NP

## 2022-07-07 ENCOUNTER — Ambulatory Visit (INDEPENDENT_AMBULATORY_CARE_PROVIDER_SITE_OTHER): Payer: Medicare Other

## 2022-07-07 DIAGNOSIS — I255 Ischemic cardiomyopathy: Secondary | ICD-10-CM

## 2022-07-07 LAB — CUP PACEART REMOTE DEVICE CHECK
Battery Remaining Longevity: 109 mo
Battery Voltage: 3.01 V
Brady Statistic RV Percent Paced: 0.01 %
Date Time Interrogation Session: 20240705043724
HighPow Impedance: 69 Ohm
Implantable Lead Connection Status: 753985
Implantable Lead Implant Date: 20210105
Implantable Lead Location: 753860
Implantable Pulse Generator Implant Date: 20210105
Lead Channel Impedance Value: 342 Ohm
Lead Channel Impedance Value: 437 Ohm
Lead Channel Pacing Threshold Amplitude: 0.5 V
Lead Channel Pacing Threshold Pulse Width: 0.4 ms
Lead Channel Sensing Intrinsic Amplitude: 9.125 mV
Lead Channel Sensing Intrinsic Amplitude: 9.125 mV
Lead Channel Setting Pacing Amplitude: 2.5 V
Lead Channel Setting Pacing Pulse Width: 0.4 ms
Lead Channel Setting Sensing Sensitivity: 0.3 mV
Zone Setting Status: 755011
Zone Setting Status: 755011

## 2022-07-18 ENCOUNTER — Telehealth: Payer: Self-pay

## 2022-07-18 NOTE — Telephone Encounter (Signed)
Called patient let know received insulin. He will be by to pick up.

## 2022-07-25 NOTE — Telephone Encounter (Signed)
Patient pick of insulin and ozempic

## 2022-07-25 NOTE — Progress Notes (Signed)
Remote ICD transmission.   

## 2022-07-26 ENCOUNTER — Telehealth: Payer: Self-pay

## 2022-07-26 NOTE — Telephone Encounter (Signed)
Patient called in and relayed message below. He stated that he will come by today or tomorrow.

## 2022-07-26 NOTE — Telephone Encounter (Signed)
Received patient assistance for box of needles.  Patient has been notified.  Box is at [employer].

## 2022-07-29 ENCOUNTER — Other Ambulatory Visit: Payer: Self-pay | Admitting: Primary Care

## 2022-07-29 DIAGNOSIS — E1159 Type 2 diabetes mellitus with other circulatory complications: Secondary | ICD-10-CM

## 2022-08-02 ENCOUNTER — Other Ambulatory Visit: Payer: Medicare Other | Admitting: Pharmacist

## 2022-08-21 ENCOUNTER — Other Ambulatory Visit: Payer: Medicare Other | Admitting: Pharmacist

## 2022-08-21 NOTE — Progress Notes (Unsigned)
08/21/2022 Name: [name] MRN: [id number] DOB: [birth year]  Chief Complaint  Patient presents with   Medication Management   Diabetes    [name] is a 77 y.o. year old male who presented for a telephone visit.   They were referred to the pharmacist by their PCP for assistance in managing diabetes.    Subjective:  Care Team: Primary Care Provider: Doreene Nest, NP ; Next Scheduled Visit: not scheduled   Medication Access/Adherence  Current Pharmacy:  [facility] [id number] [place] - [address] [place] [address] Phone: [phone] Fax: [phone]   Patient reports affordability concerns with their medications: No  Patient reports access/transportation concerns to their pharmacy: No  Patient reports adherence concerns with their medications:  No     Diabetes:  Current medications: Tresiba 100 units daily- though reports that he self reduced to 50 units daily; Novolog 20 units with meals; Ozempic 2 mg weekly on Monday nights  - notes that he does throw up every week after breakfast, a few days after taking  Medications tried in the past: report of prior intolerance to metformin due to GI upset; he does not remember a history of metformin XR and I do not see a prior script for XR. Prior trial of XR formulation was noted by PCP on prior documentation  Date of Download: 8/6-8/19/24 % Time CGM is active: 96% Average Glucose: 217 mg/dL Glucose Management Indicator: 8.5  Glucose Variability: 29.8 (goal <36%) Time in Goal:  - Time in range 70-180: 32% - Time above range: 68% - Time below range: 0%  Plays golf 3 days a week and avoids taking Novolog if he is about to go to the golf course.   Patient denies hypoglycemic s/sx including dizziness, shakiness, sweating since he reduced [place] to 50 units daily   Current meal patterns:  - Breakfast: Vilma Meckel breakfast muffin; coffee - cream and sugar  - Lunch: variable based  on what his wife fixes; sometimes sandwich - Supper: pizza at church last night - Snacks: will have ice cream sometimes at night  Current medication access support: Ozempic and Tresiba through [employer] patient assistance   Heart Failure:  Current medications:  ACEi/ARB/ARNI: Entresto 24/26 mg daily SGLT2i: Farxiga 10 mg daily Beta blocker: carvedilol 6.25 mg twice daily  Mineralocorticoid Receptor Antagonist: none  Diuretic regimen: none  Additional antianginal: isosorbide 30 mg daily   Atrial Fibrillation:  Current medications: Rate Control: carvedilol 6.25 mg twice daily Anticoagulation Regimen: Eliquis 5 mg twice daily   Hyperlipidemia/ASCVD Risk Reduction  Current lipid lowering medications:  Medications tried in the past: ezetimbe - body aches; atorvastatin, pravastatin, simvastatin - body aches   Antiplatelet regimen: none due to DOAC  Objective:  Lab Results  Component Value Date   HGBA1C 8.1 (A) 06/29/2022    Lab Results  Component Value Date   CREATININE 1.13 01/31/2022   BUN 15 01/31/2022   NA 135 01/31/2022   K 4.3 01/31/2022   CL 99 01/31/2022   CO2 20 01/31/2022    Lab Results  Component Value Date   CHOL 176 05/04/2021   HDL 44 05/04/2021   LDLCALC 125 (H) 05/04/2021   TRIG 35 05/04/2021   CHOLHDL 4.0 05/04/2021    Medications Reviewed Today     Reviewed by Alden Hipp, RPH-CPP (Pharmacist) on 08/21/22 at 1254  Med List Status: <None>   Medication Order Taking? Sig Documenting Provider Last Dose Status Informant  albuterol (VENTOLIN HFA) 108 (90 Base) MCG/ACT inhaler [id number] Yes Inhale 2 puffs into the lungs every 6 (six) hours as needed for wheezing or shortness of breath. Hannah Beat, MD Taking Active Self  carvedilol (COREG) 6.25 MG tablet [id number] Yes TAKE 1 TABLET BY MOUTH TWICE A DAY WITH FOOD Corky Crafts, MD Taking Active   Continuous Glucose Sensor (FREESTYLE LIBRE 3 SENSOR) [place] [id number] Yes PLACE 1  SENSOR ON THE SKIN EVERY 14 DAYS. USE TO CHECK GLUCOSE CONTINUOUSLY Doreene Nest, NP Taking Active   dapagliflozin propanediol (FARXIGA) 10 MG TABS tablet [id number] Yes Take 1 tablet (10 mg total) by mouth daily before breakfast. For diabetes. Doreene Nest, NP Taking Active Self           Med Note Raeanne Gathers Aug 21, 2022  9:57 AM)    Everlene Balls 5 MG TABS tablet [id number] Yes TAKE 1 TABLET BY MOUTH TWICE A DAY Lanier Prude, MD Taking Active   gabapentin (NEURONTIN) 300 MG capsule [id number] No Take 1 capsule (300 mg total) by mouth 3 (three) times daily.  Patient not taking: Reported on 08/21/2022   Hannah Beat, MD Not Taking Active   insulin aspart (NOVOLOG FLEXPEN) 100 UNIT/ML FlexPen [id number] Yes Inject 20 Units into the skin 3 (three) times daily with meals. [provider] Taking Active            Med Note Marco Collie Apr 17, 2022  3:13 PM) Novo Cares PAP  insulin degludec (TRESIBA FLEXTOUCH) 200 UNIT/ML FlexTouch Pen [id number] Yes Inject 100 Units into the skin daily. Via [employer] PAP  Patient taking differently: Inject 80 Units into the skin daily. Via Novo Cares PAP   Doreene Nest, NP Taking Active Self           Med Note Marco Collie Apr 17, 2022  3:13 PM) Novo Cares PAP  Insulin Pen Needle 31G X 8 MM MISC [id number]  Use as directed with Lantus. Doreene Nest, NP  Active Self  isosorbide mononitrate (IMDUR) 30 MG 24 hr tablet [id number] Yes TAKE 1 TABLET BY MOUTH EVERY DAY Corky Crafts, MD Taking Active Self  nitroGLYCERIN (NITROSTAT) 0.4 MG SL tablet [id number]  Place 1 tablet (0.4 mg total) under the tongue every 5 (five) minutes as needed for chest pain. Corky Crafts, MD  Active Self  ondansetron (ZOFRAN-ODT) 4 MG disintegrating tablet [id number]  Take 1 tablet (4 mg total) by mouth every 8 (eight) hours as needed for nausea or vomiting. Doreene Nest, NP  Active Self  OVER THE  COUNTER MEDICATION [id number]  Place 1 drop into both eyes 2 (two) times daily. Happy body  liquid msm eye drops/ for floaters in the eyes [provider]  Active Self  sacubitril-valsartan (ENTRESTO) 24-26 MG [id number] Yes Take 1 tablet by mouth 2 (two) times daily. Sheilah Pigeon, PA-C Taking Active   Semaglutide, 2 MG/DOSE, (OZEMPIC, 2 MG/DOSE,) 8 MG/3ML SOPN [id number]  Inject 2 mg into the skin once a week. [provider]  Active            Med Note Raeanne Gathers Aug 21, 2022 10:08 AM)    tamsulosin (FLOMAX) 0.4 MG CAPS capsule [id number] Yes TAKE 1 CAPSULE (0.4 MG TOTAL) BY MOUTH DAILY. FOR URINE FLOW. Doreene Nest, NP Taking Active  Assessment/Plan:   Diabetes: - Currently uncontrolled. Patient often self-adjusting medications due to hypoglycemia. Also notes vomiting once a week with Ozempic 2 mg weekly, and appears it is not after a significantly high fat/heavy meal. Recommend to reduce Ozempic to 1 mg weekly. Discussed XR metformin and better tolerability than IR metformin. If tolerated, would likely allow for reduced doses of insulin and therefore reduced risk of hypoglycemia. Will discuss with primary care provider.  - Reviewed dietary modifications including focus on lean proteins, fruits and vegetables, whole grains.    Hyperlipidemia/ASCVD Risk Reduction: - Currently uncontrolled, multiple prior intolerances. Could consider low dose rosuvastatin vs trial of non statin therapies. Hyperlipidemia grant foundation is currently closed so access for PCSK9i or Nexletol would be challenging. Will discuss options at a future appointment.   Heart Failure: - Currently appropriately managed - Recommend to continue current regimen. No cost concerns noted with Entresto.   Atrial Fibrillation: - Currently appropriately managed - Recommend to continue current regimen. No cost concerns noted with Eliquis  Follow Up Plan: phone call in  4 weeks  Catie Eppie Gibson, PharmD, BCACP, CPP Clinical Pharmacist [facility] [phone]

## 2022-08-22 ENCOUNTER — Telehealth: Payer: Self-pay | Admitting: Primary Care

## 2022-08-22 DIAGNOSIS — Z794 Long term (current) use of insulin: Secondary | ICD-10-CM

## 2022-08-22 MED ORDER — SEMAGLUTIDE (1 MG/DOSE) 4 MG/3ML ~~LOC~~ SOPN
1.0000 mg | PEN_INJECTOR | SUBCUTANEOUS | Status: DC
Start: 2022-08-22 — End: 2023-11-16

## 2022-08-22 MED ORDER — METFORMIN HCL ER 500 MG PO TB24
500.0000 mg | ORAL_TABLET | Freq: Every day | ORAL | 0 refills | Status: DC
Start: 2022-08-22 — End: 2022-11-16

## 2022-08-22 NOTE — Telephone Encounter (Addendum)
Hi Catie!  Thank you for your evaluation.   Sorry to learn about his symptoms with the increased dose of Ozempic.  Agree to keep him at 1 mg weekly.  I will correct this in his chart.  He and I have discussed metformin XR in the past, but he had such terrible side effects from regular metformin he was unwilling to try.  Sounds like he is ready now.  I will send a prescription for metformin XR 500 mg daily.  Will you notify him of our plan?  He will also need a follow-up visit with me on or after 09/29/2022.  Will you have him schedule this?  Jae Dire     ----- Message from [employer] sent at 08/21/2022  1:42 PM EDT ----- Genevieve Norlander!  Talked to him today. He reports vomiting every week since increasing Ozempic, so I do think we need to reduce to 1 mg. He's also self reduced [place] to 50 units daily as he has been having some lows. I do think he could stand for less basal, probably not that much. I saw in your note that he has tried metformin XR before, but I didn't see a script for it and he does not remember every having tried XR. Would you be agreeable to trying 1 metformin XR 500 mg daily? I think improving insulin sensitivity would go a long way in using less insulin (less highs and lows) and better BG control.   Let me know your thoughts.   Catie

## 2022-08-23 NOTE — Telephone Encounter (Signed)
Form completed and placed in Kelli's inbox

## 2022-08-23 NOTE — Telephone Encounter (Signed)
I will call him, thanks!  Catie

## 2022-08-23 NOTE — Telephone Encounter (Signed)
[email or web address].[email or web address]/content/[email or web address]_[email or web address]  Can we complete the above form for Ozempic 1 mg, 120 day supply and fax in please? Thanks!  Catie

## 2022-08-23 NOTE — Telephone Encounter (Signed)
Filled out form placed in your box for review.

## 2022-08-24 NOTE — Telephone Encounter (Signed)
Form faxed as requested.

## 2022-08-26 ENCOUNTER — Other Ambulatory Visit: Payer: Self-pay | Admitting: Interventional Cardiology

## 2022-09-08 ENCOUNTER — Telehealth: Payer: Self-pay

## 2022-09-08 ENCOUNTER — Encounter: Payer: Self-pay | Admitting: Primary Care

## 2022-09-08 NOTE — Telephone Encounter (Signed)
ERROR

## 2022-09-08 NOTE — Telephone Encounter (Signed)
Patient has picked up medication 

## 2022-09-08 NOTE — Telephone Encounter (Signed)
Received PAP meds 4 boxes of ozempic Patient has been notified Placed in middle fridge.

## 2022-09-12 ENCOUNTER — Telehealth: Payer: Self-pay

## 2022-09-12 NOTE — Telephone Encounter (Signed)
Received PAP meds Novolog Flex pen 6 boxes Patients wife has been notified.  Placed in middle fridge.

## 2022-09-13 NOTE — Telephone Encounter (Signed)
Patient came by and picked up Flex Pens

## 2022-09-18 ENCOUNTER — Encounter: Payer: Self-pay | Admitting: Primary Care

## 2022-09-18 ENCOUNTER — Ambulatory Visit: Payer: Medicare Other | Admitting: Primary Care

## 2022-09-18 VITALS — BP 136/78 | HR 80 | Temp 97.3°F | Ht 68.0 in | Wt 212.0 lb

## 2022-09-18 DIAGNOSIS — Z23 Encounter for immunization: Secondary | ICD-10-CM | POA: Diagnosis not present

## 2022-09-18 DIAGNOSIS — E1122 Type 2 diabetes mellitus with diabetic chronic kidney disease: Secondary | ICD-10-CM | POA: Diagnosis not present

## 2022-09-18 DIAGNOSIS — N182 Chronic kidney disease, stage 2 (mild): Secondary | ICD-10-CM | POA: Diagnosis not present

## 2022-09-18 DIAGNOSIS — Z794 Long term (current) use of insulin: Secondary | ICD-10-CM | POA: Diagnosis not present

## 2022-09-18 LAB — POCT GLYCOSYLATED HEMOGLOBIN (HGB A1C): Hemoglobin A1C: 7.7 % — AB (ref 4.0–5.6)

## 2022-09-18 NOTE — Assessment & Plan Note (Signed)
Improved with A1C today of 7.7!!  Reviewed CGM with patient, clear improvement in glucose readings. Also, no hypoglycemic events.  Continue Tresiba 80 units daily, NovoLog 20 units 3 times daily, Farxiga 10 mg daily, metformin ER 500 mg daily.  Follow-up in 3 months.

## 2022-09-18 NOTE — Patient Instructions (Signed)
Keep up the great work!  Please schedule a follow up visit for 3 months.  It was a pleasure to see you today!

## 2022-09-18 NOTE — Progress Notes (Signed)
Subjective:    Patient ID: [name], male    DOB: 11-Nov-1945, 77 y.o.   MRN: [id number]  HPI  ONYX KINNARD is a very pleasant 77 y.o. male with a significant medical history including CAD, hypertension, atrial fibrillation, IBS, NASH, type 2 diabetes, hyperlipidemia, BPH, cardiomyopathy with ICD in place who presents today for follow-up of diabetes.  Current medications include: Metformin ER 500 mg daily, Tresiba 80 units daily, NovoLog 20 units 3 times daily, Ozempic 1 mg weekly, Farxiga 10 mg daily  He is checking his blood glucose continuously and is getting readings of:  He is within desired range 42% of the time over the last 90 days. He is > 250 24% of the time. He has not had hypoglycemic events  Last A1C: 8.1 in June 2024,  Last Eye Exam: Due Last Foot Exam: UTD  Pneumonia Vaccination: 2018 Urine Microalbumin: Up-to-date Statin: None  Dietary changes since last visit: Reduced portion sizes overall. He's cut back on bread.   Wt Readings from Last 3 Encounters:  09/18/22 212 lb (96.2 kg)  06/29/22 212 lb (96.2 kg)  05/08/22 204 lb 6 oz (92.7 kg)      Exercise: None. Golfing 2-3 times weekly     Review of Systems  Respiratory:  Negative for shortness of breath.   Cardiovascular:  Negative for chest pain.  Neurological:  Negative for dizziness, numbness and headaches.         Past Medical History:  Diagnosis Date   Arthritis    "fingers, shoulders" (08/03/2015)   Atrial fibrillation (HCC)    Paroxysmal, rare episodes, sinus rhythm on flecainide, patient prefers not to take Coumadin   Bradycardia    April, 2013   Chest pain    Nuclear, 2006, no ischemia   Chronic lower back pain    "all my life"   Coronary artery disease    s/p staged cath 05/12/2014 and 5/11, DES x 2 to heavily calcified RCA, residual with 60% prox LAD, 70% D1   Ejection fraction    EF 55-60%,  echo, January, 2011   Elevated CPK    CPK elevated with normal MB and normal  troponin the past   Heart murmur    History of echocardiogram    Echo 8/16: EF 55%, normal wall motion, grade 2 diastolic dysfunction, trivial MR, normal RV function, PASP 27 mmHg   Hypertension    IBS (irritable bowel syndrome)    Myocardial infarction (HCC) 06/2014   Sinus drainage    Type II diabetes mellitus (HCC)     Social History   Socioeconomic History   Marital status: Married    Spouse name: Not on file   Number of children: Not on file   Years of education: Not on file   Highest education level: Not on file  Occupational History   Not on file  Tobacco Use   Smoking status: Never   Smokeless tobacco: Never  Vaping Use   Vaping status: Never Used  Substance and Sexual Activity   Alcohol use: No   Drug use: No   Sexual activity: Not on file  Other Topics Concern   Not on file  Social History Narrative   Not on file   Social Determinants of Health   Financial Resource Strain: Low Risk  (01/11/2022)   Overall Financial Resource Strain (CARDIA)    Difficulty of Paying Living Expenses: Not hard at all  Food Insecurity: No Food Insecurity (01/11/2022)   Hunger  Vital Sign    Worried About Programme researcher, broadcasting/film/video in the Last Year: Never true    Ran Out of Food in the Last Year: Never true  Transportation Needs: No Transportation Needs (01/11/2022)   PRAPARE - Administrator, Civil Service (Medical): No    Lack of Transportation (Non-Medical): No  Physical Activity: Sufficiently Active (01/11/2022)   Exercise Vital Sign    Days of Exercise per Week: 3 days    Minutes of Exercise per Session: 150+ min  Stress: No Stress Concern Present (01/11/2022)   [employer] of Occupational Health - Occupational Stress Questionnaire    Feeling of Stress : Not at all  Social Connections: Socially Integrated (01/11/2022)   Social Connection and Isolation Panel [NHANES]    Frequency of Communication with Friends and Family: More than three times a week    Frequency of  Social Gatherings with Friends and Family: More than three times a week    Attends Religious Services: More than 4 times per year    Active Member of Clubs or Organizations: Yes    Attends Banker Meetings: More than 4 times per year    Marital Status: Married  Catering manager Violence: Not At Risk (01/11/2022)   Humiliation, Afraid, Rape, and Kick questionnaire    Fear of Current or Ex-Partner: No    Emotionally Abused: No    Physically Abused: No    Sexually Abused: No    Past Surgical History:  Procedure Laterality Date   APPENDECTOMY     CARDIAC CATHETERIZATION N/A 05/12/2014   Procedure: Left Heart Cath and Coronary Angiography;  Surgeon: Lennette Bihari, MD;  Location: [facility];  Service: Cardiovascular;  Laterality: N/A;   CARDIAC CATHETERIZATION N/A 05/13/2014   Procedure: Coronary/Graft Atherectomy;  Surgeon: Lennette Bihari, MD;  Location: [facility];  Service: Cardiovascular;  Laterality: N/A;   CARDIAC CATHETERIZATION Right 05/13/2014   Procedure: Temporary Pacemaker;  Surgeon: Lennette Bihari, MD;  Location: [facility];  Service: Cardiovascular;  Laterality: Right;   CARDIAC CATHETERIZATION N/A 05/13/2014   Procedure: Coronary Stent Intervention;  Surgeon: Lennette Bihari, MD;  Location: [facility];  Service: Cardiovascular;  Laterality: N/A;   CARDIAC CATHETERIZATION N/A 06/18/2014   Procedure: Left Heart Cath and Coronary Angiography;  Surgeon: Peter M Swaziland, MD;  [facility] INVASIVE CV LAB;  Service: Cardiovascular;  Laterality: N/A;   CARDIAC CATHETERIZATION N/A 08/03/2015   Procedure: Left Heart Cath and Cors/Grafts Angiography;  Surgeon: Yvonne Kendall, MD;  Location: [facility] INVASIVE CV LAB;  Service: Cardiovascular;  Laterality: N/A;   CARDIOVASCULAR STRESS TEST  05/06/2014   CARDIOVERSION N/A 02/21/2022   Procedure: CARDIOVERSION;  Surgeon: Meriam Sprague, MD;  Location: [facility] ENDOSCOPY;  Service: Cardiovascular;  Laterality:  N/A;   CATARACT EXTRACTION W/ INTRAOCULAR LENS IMPLANT Left    COLONOSCOPY WITH PROPOFOL N/A 02/13/2020   Procedure: COLONOSCOPY WITH PROPOFOL;  Surgeon: Jeani Hawking, MD;  Location: [facility] ENDOSCOPY;  Service: Endoscopy;  Laterality: N/A;   CORONARY ARTERY BYPASS GRAFT N/A 06/22/2014   Procedure: CORONARY ARTERY BYPASS GRAFTING (CABG) x five, using left internal mammary artery, and right leg greater saphenous vein harvested endoscopically;  Surgeon: Loreli Slot, MD;  Location: [facility] OR;  Service: Open Heart Surgery;  Laterality: N/A;   ICD IMPLANT N/A 01/07/2019   Procedure: ICD IMPLANT;  Surgeon: Hillis Range, MD;  Location: [facility];  Service: Cardiovascular;  Laterality: N/A;  LAPAROSCOPIC CHOLECYSTECTOMY     LEFT HEART CATH AND CORS/GRAFTS ANGIOGRAPHY N/A 01/06/2019   Procedure: LEFT HEART CATH AND CORS/GRAFTS ANGIOGRAPHY;  Surgeon: Yvonne Kendall, MD;  Location: [facility];  Service: Cardiovascular;  Laterality: N/A;   MAZE N/A 06/22/2014   Procedure: MAZE;  Surgeon: Loreli Slot, MD;  Location: [facility] OR;  Service: Open Heart Surgery;  Laterality: N/A;   POLYPECTOMY  02/13/2020   Procedure: POLYPECTOMY;  Surgeon: Jeani Hawking, MD;  Location: [facility] ENDOSCOPY;  Service: Endoscopy;;   TEE WITHOUT CARDIOVERSION N/A 06/22/2014   Procedure: TRANSESOPHAGEAL ECHOCARDIOGRAM (TEE);  Surgeon: Loreli Slot, MD;  Location: [facility] OR;  Service: Open Heart Surgery;  Laterality: N/A;    Family History  Problem Relation Age of Onset   Alzheimer's disease Mother    Emphysema Father    Cancer Brother    Arrhythmia Sister    Heart attack Sister    Heart disease Sister    Hyperlipidemia Sister    Hypertension Sister     Allergies  Allergen Reactions   Lipitor [Atorvastatin] Other (See Comments)    Myopathy Spring 2006   Pravastatin Other (See Comments)    LEG CRAMPS   Simvastatin Other (See Comments)    LEG CRAMPS    Current Outpatient Medications on File Prior to  Visit  Medication Sig Dispense Refill   albuterol (VENTOLIN HFA) 108 (90 Base) MCG/ACT inhaler Inhale 2 puffs into the lungs every 6 (six) hours as needed for wheezing or shortness of breath. 8 g 3   carvedilol (COREG) 6.25 MG tablet TAKE 1 TABLET BY MOUTH TWICE A DAY WITH FOOD 180 tablet 1   Continuous Glucose Sensor (FREESTYLE LIBRE 3 SENSOR) MISC PLACE 1 SENSOR ON THE SKIN EVERY 14 DAYS. USE TO CHECK GLUCOSE CONTINUOUSLY 6 each 1   dapagliflozin propanediol (FARXIGA) 10 MG TABS tablet Take 1 tablet (10 mg total) by mouth daily before breakfast. For diabetes. 90 tablet 1   ELIQUIS 5 MG TABS tablet TAKE 1 TABLET BY MOUTH TWICE A DAY 60 tablet 5   insulin aspart (NOVOLOG FLEXPEN) 100 UNIT/ML FlexPen Inject 20 Units into the skin 3 (three) times daily with meals.     insulin degludec (TRESIBA FLEXTOUCH) 200 UNIT/ML FlexTouch Pen Inject 100 Units into the skin daily. Via [employer] PAP (Patient taking differently: Inject 80 Units into the skin daily. Via [employer] PAP) 36 mL 0   Insulin Pen Needle 31G X 8 MM MISC Use as directed with Lantus. 100 each 5   isosorbide mononitrate (IMDUR) 30 MG 24 hr tablet TAKE 1 TABLET BY MOUTH EVERY DAY 90 tablet 2   nitroGLYCERIN (NITROSTAT) 0.4 MG SL tablet Place 1 tablet (0.4 mg total) under the tongue every 5 (five) minutes as needed for chest pain. 25 tablet 6   ondansetron (ZOFRAN-ODT) 4 MG disintegrating tablet Take 1 tablet (4 mg total) by mouth every 8 (eight) hours as needed for nausea or vomiting. 15 tablet 0   OVER THE COUNTER MEDICATION Place 1 drop into both eyes 2 (two) times daily. Happy body  liquid msm eye drops/ for floaters in the eyes     sacubitril-valsartan (ENTRESTO) 24-26 MG Take 1 tablet by mouth 2 (two) times daily. 180 tablet 2   Semaglutide, 1 MG/DOSE, 4 MG/3ML SOPN Inject 1 mg as directed once a week. for diabetes.     tamsulosin (FLOMAX) 0.4 MG CAPS capsule TAKE 1 CAPSULE (0.4 MG TOTAL) BY MOUTH DAILY. FOR URINE FLOW. 90 capsule  3    gabapentin (NEURONTIN) 300 MG capsule Take 1 capsule (300 mg total) by mouth 3 (three) times daily. (Patient not taking: Reported on 09/18/2022) 30 capsule 3   metFORMIN (GLUCOPHAGE-XR) 500 MG 24 hr tablet Take 1 tablet (500 mg total) by mouth daily with breakfast. for diabetes. (Patient not taking: Reported on 09/18/2022) 90 tablet 0   No current facility-administered medications on file prior to visit.    BP 136/78   Pulse 80   Temp (!) 97.3 F (36.3 C) (Temporal)   Ht 5\' 8"  (1.727 m)   Wt 212 lb (96.2 kg)   SpO2 99%   BMI 32.23 kg/m  Objective:   Physical Exam Cardiovascular:     Rate and Rhythm: Normal rate and regular rhythm.  Pulmonary:     Effort: Pulmonary effort is normal.     Breath sounds: Normal breath sounds. No wheezing or rales.  Musculoskeletal:     Cervical back: Neck supple.  Skin:    General: Skin is warm and dry.  Neurological:     Mental Status: He is alert and oriented to person, place, and time.           Assessment & Plan:  Type 2 diabetes mellitus with stage 2 chronic kidney disease, with long-term current use of insulin (HCC) Assessment & Plan: Improved with A1C today of 7.7!!  Reviewed CGM with patient, clear improvement in glucose readings. Also, no hypoglycemic events.  Continue Tresiba 80 units daily, NovoLog 20 units 3 times daily, Farxiga 10 mg daily, metformin ER 500 mg daily.  Follow-up in 3 months.  Orders: -     POCT glycosylated hemoglobin (Hb A1C)        Doreene Nest, NP

## 2022-10-03 ENCOUNTER — Ambulatory Visit: Payer: Medicare Other | Admitting: Primary Care

## 2022-10-06 ENCOUNTER — Ambulatory Visit (INDEPENDENT_AMBULATORY_CARE_PROVIDER_SITE_OTHER): Payer: Medicare Other

## 2022-10-06 DIAGNOSIS — I255 Ischemic cardiomyopathy: Secondary | ICD-10-CM

## 2022-10-07 LAB — CUP PACEART REMOTE DEVICE CHECK
Battery Remaining Longevity: 106 mo
Battery Voltage: 3 V
Brady Statistic RV Percent Paced: 0.01 %
Date Time Interrogation Session: 20241004012304
HighPow Impedance: 77 Ohm
Implantable Lead Connection Status: 753985
Implantable Lead Implant Date: 20210105
Implantable Lead Location: 753860
Implantable Pulse Generator Implant Date: 20210105
Lead Channel Impedance Value: 380 Ohm
Lead Channel Impedance Value: 437 Ohm
Lead Channel Pacing Threshold Amplitude: 0.625 V
Lead Channel Pacing Threshold Pulse Width: 0.4 ms
Lead Channel Sensing Intrinsic Amplitude: 9.5 mV
Lead Channel Sensing Intrinsic Amplitude: 9.5 mV
Lead Channel Setting Pacing Amplitude: 2.5 V
Lead Channel Setting Pacing Pulse Width: 0.4 ms
Lead Channel Setting Sensing Sensitivity: 0.3 mV
Zone Setting Status: 755011
Zone Setting Status: 755011

## 2022-10-18 ENCOUNTER — Telehealth: Payer: Self-pay

## 2022-10-18 NOTE — Progress Notes (Signed)
Remote ICD transmission.   

## 2022-10-18 NOTE — Telephone Encounter (Signed)
Notified patient we received 3 boxes of ozempic through PAP Labeled and placed in middle

## 2022-11-10 DIAGNOSIS — R062 Wheezing: Secondary | ICD-10-CM | POA: Diagnosis not present

## 2022-11-10 DIAGNOSIS — R059 Cough, unspecified: Secondary | ICD-10-CM | POA: Diagnosis not present

## 2022-11-10 DIAGNOSIS — R0981 Nasal congestion: Secondary | ICD-10-CM | POA: Diagnosis not present

## 2022-11-16 ENCOUNTER — Other Ambulatory Visit: Payer: Self-pay | Admitting: Primary Care

## 2022-11-16 DIAGNOSIS — E1159 Type 2 diabetes mellitus with other circulatory complications: Secondary | ICD-10-CM

## 2022-11-16 NOTE — Telephone Encounter (Signed)
Patient is due for diabetes follow up in late December. Can we get him scheduled, thanks!

## 2022-12-12 ENCOUNTER — Telehealth: Payer: Self-pay

## 2022-12-12 NOTE — Telephone Encounter (Signed)
Notified patient we received 2 boxes of ozempic through PAP Labeled and placed in middle fridge

## 2022-12-13 NOTE — Telephone Encounter (Signed)
Patient came by to pick up ozempic

## 2022-12-15 ENCOUNTER — Other Ambulatory Visit: Payer: Self-pay | Admitting: Interventional Cardiology

## 2022-12-26 ENCOUNTER — Other Ambulatory Visit: Payer: Self-pay | Admitting: Primary Care

## 2022-12-26 DIAGNOSIS — Z794 Long term (current) use of insulin: Secondary | ICD-10-CM

## 2023-01-05 ENCOUNTER — Ambulatory Visit (INDEPENDENT_AMBULATORY_CARE_PROVIDER_SITE_OTHER): Payer: Medicare Other

## 2023-01-05 DIAGNOSIS — I255 Ischemic cardiomyopathy: Secondary | ICD-10-CM | POA: Diagnosis not present

## 2023-01-05 LAB — CUP PACEART REMOTE DEVICE CHECK
Battery Remaining Longevity: 103 mo
Battery Voltage: 2.99 V
Brady Statistic RV Percent Paced: 0.01 %
Date Time Interrogation Session: 20250103022823
HighPow Impedance: 74 Ohm
Implantable Lead Connection Status: 753985
Implantable Lead Implant Date: 20210105
Implantable Lead Location: 753860
Implantable Pulse Generator Implant Date: 20210105
Lead Channel Impedance Value: 399 Ohm
Lead Channel Impedance Value: 494 Ohm
Lead Channel Pacing Threshold Amplitude: 0.5 V
Lead Channel Pacing Threshold Pulse Width: 0.4 ms
Lead Channel Sensing Intrinsic Amplitude: 10.375 mV
Lead Channel Sensing Intrinsic Amplitude: 10.375 mV
Lead Channel Setting Pacing Amplitude: 2.5 V
Lead Channel Setting Pacing Pulse Width: 0.4 ms
Lead Channel Setting Sensing Sensitivity: 0.3 mV
Zone Setting Status: 755011
Zone Setting Status: 755011

## 2023-01-10 ENCOUNTER — Ambulatory Visit: Payer: Medicare Other

## 2023-01-10 VITALS — Ht 68.0 in | Wt 205.0 lb

## 2023-01-10 DIAGNOSIS — Z0000 Encounter for general adult medical examination without abnormal findings: Secondary | ICD-10-CM | POA: Diagnosis not present

## 2023-01-10 NOTE — Progress Notes (Signed)
 Subjective:   [name] is a 78 y.o. male who presents for Medicare Annual/Subsequent preventive examination.  Visit Complete: Virtual I connected with  [name] on 01/10/23 by a audio enabled telemedicine application and verified that I am speaking with the correct person using two identifiers.  Patient Location: Home  Provider Location: Office/Clinic  I discussed the limitations of evaluation and management by telemedicine. The patient expressed understanding and agreed to proceed.  Vital Signs: Because this visit was a virtual/telehealth visit, some criteria may be missing or patient reported. Any vitals not documented were not able to be obtained and vitals that have been documented are patient reported.  Patient Medicare AWV questionnaire was completed by the patient on (not done); I have confirmed that all information answered by patient is correct and no changes since this date.  Cardiac Risk Factors include: advanced age (>67men, >39 women);diabetes mellitus;dyslipidemia;hypertension;male gender;obesity (BMI >30kg/m2)    Objective:    Today's Vitals   01/10/23 1543  Weight: 205 lb (93 kg)  Height: 5' 8 (1.727 m)  PainSc: 0-No pain   Body mass index is 31.17 kg/m.     01/10/2023    3:58 PM 02/21/2022    6:44 AM 02/15/2022    8:34 AM 01/11/2022    3:23 PM 01/07/2021    3:31 PM 02/13/2020    8:47 AM 01/06/2019   12:00 AM  Advanced Directives  Does Patient Have a Medical Advance Directive? Yes No Yes Yes Yes Yes Yes  Type of Estate Agent of [place];Living will  Healthcare Power of [place];Living will Healthcare Power of [place];Living will Healthcare Power of [place];Living will Living will Healthcare Power of [place];Living will  Does patient want to make changes to medical advance directive?     Yes (MAU/Ambulatory/Procedural Areas - Information given)  No - Patient declined  Copy of Healthcare Power of Attorney in Chart? No - copy  requested  No - copy requested No - copy requested   No - copy requested    Current Medications (verified) Outpatient Encounter Medications as of 01/10/2023  Medication Sig   albuterol  (VENTOLIN  HFA) 108 (90 Base) MCG/ACT inhaler Inhale 2 puffs into the lungs every 6 (six) hours as needed for wheezing or shortness of breath.   carvedilol  (COREG ) 6.25 MG tablet TAKE 1 TABLET BY MOUTH TWICE A DAY WITH FOOD   Continuous Glucose Sensor (FREESTYLE LIBRE 3 SENSOR) MISC PLACE 1 SENSOR ON THE SKIN EVERY 14 DAYS. USE TO CHECK GLUCOSE CONTINUOUSLY   dapagliflozin  propanediol (FARXIGA ) 10 MG TABS tablet Take 1 tablet (10 mg total) by mouth daily before breakfast. For diabetes.   ELIQUIS  5 MG TABS tablet TAKE 1 TABLET BY MOUTH TWICE A DAY   gabapentin  (NEURONTIN ) 300 MG capsule Take 1 capsule (300 mg total) by mouth 3 (three) times daily. (Patient not taking: Reported on 09/18/2022)   insulin  aspart (NOVOLOG  FLEXPEN) 100 UNIT/ML FlexPen Inject 20 Units into the skin 3 (three) times daily with meals.   insulin  degludec (TRESIBA  FLEXTOUCH) 200 UNIT/ML FlexTouch Pen Inject 100 Units into the skin daily. Via [employer] PAP (Patient taking differently: Inject 80 Units into the skin daily. Via [employer] PAP)   Insulin  Pen Needle 31G X 8 MM MISC Use as directed with Lantus .   isosorbide  mononitrate (IMDUR ) 30 MG 24 hr tablet TAKE 1 TABLET BY MOUTH EVERY DAY   metFORMIN  (GLUCOPHAGE -XR) 500 MG 24 hr tablet TAKE 1 TABLET BY MOUTH DAILY WITH BREAKFAST. FOR DIABETES.  nitroGLYCERIN  (NITROSTAT ) 0.4 MG SL tablet Place 1 tablet (0.4 mg total) under the tongue every 5 (five) minutes as needed for chest pain.   ondansetron  (ZOFRAN -ODT) 4 MG disintegrating tablet Take 1 tablet (4 mg total) by mouth every 8 (eight) hours as needed for nausea or vomiting.   OVER THE COUNTER MEDICATION Place 1 drop into both eyes 2 (two) times daily. Happy body  liquid msm eye drops/ for floaters in the eyes   sacubitril-valsartan (ENTRESTO )  24-26 MG Take 1 tablet by mouth 2 (two) times daily.   Semaglutide , 1 MG/DOSE, 4 MG/3ML SOPN Inject 1 mg as directed once a week. for diabetes.   tamsulosin  (FLOMAX ) 0.4 MG CAPS capsule TAKE 1 CAPSULE (0.4 MG TOTAL) BY MOUTH DAILY. FOR URINE FLOW.   No facility-administered encounter medications on file as of 01/10/2023.    Allergies (verified) Lipitor [atorvastatin ], Pravastatin , and Simvastatin    History: Past Medical History:  Diagnosis Date   Arthritis    fingers, shoulders (08/03/2015)   Atrial fibrillation (HCC)    Paroxysmal, rare episodes, sinus rhythm on flecainide , patient prefers not to take Coumadin   Bradycardia    April, 2013   Chest pain    Nuclear, 2006, no ischemia   Chronic lower back pain    all my life   Coronary artery disease    s/p staged cath 05/12/2014 and 5/11, DES x 2 to heavily calcified RCA, residual with 60% prox LAD, 70% D1   Ejection fraction    EF 55-60%,  echo, January, 2011   Elevated CPK    CPK elevated with normal MB and normal troponin the past   Heart murmur    History of echocardiogram    Echo 8/16: EF 55%, normal wall motion, grade 2 diastolic dysfunction, trivial MR, normal RV function, PASP 27 mmHg   Hypertension    IBS (irritable bowel syndrome)    Myocardial infarction (HCC) 06/2014   Sinus drainage    Type II diabetes mellitus (HCC)    Past Surgical History:  Procedure Laterality Date   APPENDECTOMY     CARDIAC CATHETERIZATION N/A 05/12/2014   Procedure: Left Heart Cath and Coronary Angiography;  Surgeon: Debby DELENA Sor, MD;  Location: [facility];  Service: Cardiovascular;  Laterality: N/A;   CARDIAC CATHETERIZATION N/A 05/13/2014   Procedure: Coronary/Graft Atherectomy;  Surgeon: Debby DELENA Sor, MD;  Location: [facility];  Service: Cardiovascular;  Laterality: N/A;   CARDIAC CATHETERIZATION Right 05/13/2014   Procedure: Temporary Pacemaker;  Surgeon: Debby DELENA Sor, MD;  Location: [facility];  Service:  Cardiovascular;  Laterality: Right;   CARDIAC CATHETERIZATION N/A 05/13/2014   Procedure: Coronary Stent Intervention;  Surgeon: Debby DELENA Sor, MD;  Location: [facility];  Service: Cardiovascular;  Laterality: N/A;   CARDIAC CATHETERIZATION N/A 06/18/2014   Procedure: Left Heart Cath and Coronary Angiography;  Surgeon: Peter M Jordan, MD;  [facility]VASIVE CV LAB;  Service: Cardiovascular;  Laterality: N/A;   CARDIAC CATHETERIZATION N/A 08/03/2015   Procedure: Left Heart Cath and Cors/Grafts Angiography;  Surgeon: Lonni Hanson, MD;  Location: [facility] INVASIVE CV LAB;  Service: Cardiovascular;  Laterality: N/A;   CARDIOVASCULAR STRESS TEST  05/06/2014   CARDIOVERSION N/A 02/21/2022   Procedure: CARDIOVERSION;  Surgeon: Hobart Powell BRAVO, MD;  Location[facility] ENDOSCOPY;  Service: Cardiovascular;  Laterality: N/A;   CATARACT EXTRACTION W/ INTRAOCULAR LENS IMPLANT Left    COLONOSCOPY WITH PROPOFOL  N/A 02/13/2020   Procedure: COLONOSCOPY WITH PROPOFOL ;  Surgeon: Rollin Dover, MD;  Location: [facility];  Service: Endoscopy;  Laterality: N/A;   CORONARY ARTERY BYPASS GRAFT N/A 06/22/2014   Procedure: CORONARY ARTERY BYPASS GRAFTING (CABG) x five, using left internal mammary artery, and right leg greater saphenous vein harvested endoscopically;  Surgeon: Elspeth JAYSON Millers, MD;  Location: [facility] OR;  Service: Open Heart Surgery;  Laterality: N/A;   ICD IMPLANT N/A 01/07/2019   Procedure: ICD IMPLANT;  Surgeon: Kelsie Agent, MD;  Location: [facility];  Service: Cardiovascular;  Laterality: N/A;   LAPAROSCOPIC CHOLECYSTECTOMY     LEFT HEART CATH AND CORS/GRAFTS ANGIOGRAPHY N/A 01/06/2019   Procedure: LEFT HEART CATH AND CORS/GRAFTS ANGIOGRAPHY;  Surgeon: Mady Bruckner, MD;  Location: [facility];  Service: Cardiovascular;  Laterality: N/A;   MAZE N/A 06/22/2014   Procedure: MAZE;  Surgeon: Elspeth JAYSON Millers, MD;  Location[facility] OR;  Service: Open Heart Surgery;  Laterality: N/A;    POLYPECTOMY  02/13/2020   Procedure: POLYPECTOMY;  Surgeon: Rollin Dover, MD;  Location: [facility] ENDOSCOPY;  Service: Endoscopy;;   TEE WITHOUT CARDIOVERSION N/A 06/22/2014   Procedure: TRANSESOPHAGEAL ECHOCARDIOGRAM (TEE);  Surgeon: Elspeth JAYSON Millers, MD;  Location: [facility] OR;  Service: Open Heart Surgery;  Laterality: N/A;   Family History  Problem Relation Age of Onset   Alzheimer's disease Mother    Emphysema Father    Cancer Brother    Arrhythmia Sister    Heart attack Sister    Heart disease Sister    Hyperlipidemia Sister    Hypertension Sister    Social History   Socioeconomic History   Marital status: Married    Spouse name: Not on file   Number of children: Not on file   Years of education: Not on file   Highest education level: Not on file  Occupational History   Not on file  Tobacco Use   Smoking status: Never   Smokeless tobacco: Never  Vaping Use   Vaping status: Never Used  Substance and Sexual Activity   Alcohol use: No   Drug use: No   Sexual activity: Not on file  Other Topics Concern   Not on file  Social History Narrative   Not on file   Social Drivers of Health   Financial Resource Strain: Low Risk  (01/10/2023)   Overall Financial Resource Strain (CARDIA)    Difficulty of Paying Living Expenses: Not very hard  Food Insecurity: No Food Insecurity (01/10/2023)   Hunger Vital Sign    Worried About Running Out of Food in the Last Year: Never true    Ran Out of Food in the Last Year: Never true  Transportation Needs: No Transportation Needs (01/10/2023)   PRAPARE - Administrator, Civil Service (Medical): No    Lack of Transportation (Non-Medical): No  Physical Activity: Sufficiently Active (01/10/2023)   Exercise Vital Sign    Days of Exercise per Week: 3 days    Minutes of Exercise per Session: 150+ min  Stress: No Stress Concern Present (01/10/2023)   [employer] of Occupational Health - Occupational Stress Questionnaire    Feeling of  Stress : Not at all  Social Connections: Socially Integrated (01/10/2023)   Social Connection and Isolation Panel [NHANES]    Frequency of Communication with Friends and Family: More than three times a week    Frequency of Social Gatherings with Friends and Family: More than three times a week    Attends Religious Services: More than 4 times per  year    Active Member of Clubs or Organizations: Yes    Attends Engineer, Structural: More than 4 times per year    Marital Status: Married    Tobacco Counseling Counseling given: Not Answered  Clinical Intake:  Pre-visit preparation completed: Yes  Pain : No/denies pain Pain Score: 0-No pain    BMI - recorded: 31.17 Nutritional Status: BMI > 30  Obese Nutritional Risks: None Diabetes: Yes CBG done?: Yes (BS 270 right now; 145 this am at home) CBG resulted in Enter/ Edit results?: No Did pt. bring in CBG monitor from home?: No  How often do you need to have someone help you when you read instructions, pamphlets, or other written materials from your doctor or pharmacy?: 1 - Never  Interpreter Needed?: No  Comments: lives with wife Information entered by :: B.Marigny Borre,LPN   Activities of Daily Living    01/10/2023    3:58 PM 01/11/2022    3:24 PM  In your present state of health, do you have any difficulty performing the following activities:  Hearing? 0 1  Comment  hearing aids coming  Vision? 0 0  Difficulty concentrating or making decisions? 0 0  Walking or climbing stairs? 0 0  Dressing or bathing? 0 0  Doing errands, shopping? 0 0  Preparing Food and eating ? N N  Using the Toilet? N N  In the past six months, have you accidently leaked urine? N N  Do you have problems with loss of bowel control? N N  Managing your Medications? N N  Managing your Finances? N N  Housekeeping or managing your Housekeeping? N N    Patient Care Team: Gretta Comer POUR, NP as PCP - General (Internal Medicine) Dann Candyce RAMAN, MD as PCP - Cardiology (Cardiology) Cindie Ole DASEN, MD as PCP - Electrophysiology (Cardiology) Cleotilde Sewer, OD (Optometry) Geronimo Manuelita SAUNDERS, [facility] (Pharmacist)  Indicate any recent Medical Services you may have received from other than [facility] providers in the past year (date may be approximate).     Assessment:   This is a routine wellness examination for Alhaji.  Hearing/Vision screen Hearing Screening - Comments:: Pt says his hearing is good with hearing aids Vision Screening - Comments:: Pt says vision is good Dr Cleotilde   Goals Addressed             This Visit's Progress    Increase physical activity   On track    Starting 09/05/2017, I will continue to play golf for at least 4 hours 3 days per week and do yard work as needed.      Patient Stated   On track    Would like to maintain current routine     Patient Stated   On track    Get to feeling better     Weight (lb) < 200 lb (90.7 kg)   205 lb (93 kg)    01/10/23, wants to get to 180 pounds       Depression Screen    01/10/2023    3:55 PM 09/18/2022   10:18 AM 03/29/2022    7:26 AM 01/11/2022    3:22 PM 01/07/2021    3:34 PM 08/25/2020    8:20 AM 11/26/2019    7:17 AM  PHQ 2/9 Scores  PHQ - 2 Score 0 0 0 0 0 0 0  PHQ- 9 Score      0 0    Fall Risk    01/10/2023  3:49 PM 09/18/2022   10:18 AM 06/29/2022    7:14 AM 03/29/2022    7:26 AM 01/11/2022    3:24 PM  Fall Risk   Falls in the past year? 0 0 0 0 1  Number falls in past yr: 0 0 0 0 1  Injury with Fall? 0 0 0 0 0  Risk for fall due to : No Fall Risks No Fall Risks No Fall Risks No Fall Risks Impaired vision  Follow up Education provided;Falls prevention discussed Falls evaluation completed Falls evaluation completed Falls evaluation completed Falls prevention discussed    MEDICARE RISK AT HOME: Medicare Risk at Home Any stairs in or around the home?: Yes (4 steps in front of house) If so, are there any without handrails?: Yes Home free of loose  throw rugs in walkways, pet beds, electrical cords, etc?: Yes Adequate lighting in your home to reduce risk of falls?: Yes Life alert?: No Use of a cane, walker or w/c?: No Grab bars in the bathroom?: Yes Shower chair or bench in shower?: Yes Elevated toilet seat or a handicapped toilet?: Yes  TIMED UP AND GO:  Was the test performed?  No    Cognitive Function:    09/13/2018   11:22 AM 09/05/2017    3:57 PM 08/24/2016    8:56 AM  MMSE - Mini Mental State Exam  Orientation to time 5 5 5   Orientation to Place 5 5 5   Registration 3 3 3   Attention/ Calculation 5 0 0  Recall 3 3 2   Recall-comments   pt was unable to recall 1 of 3 words  Language- name 2 objects 0 0 0  Language- repeat 1 1 1   Language- follow 3 step command 0 3 3  Language- read & follow direction 0 0 0  Write a sentence 0 0 0  Copy design 0 0 0  Total score 22 20 19         01/10/2023    4:00 PM 01/11/2022    3:25 PM  6CIT Screen  What Year? 0 points 0 points  What month? 0 points 0 points  What time? 0 points 0 points  Count back from 20 0 points 0 points  Months in reverse 2 points 4 points  Repeat phrase 6 points 2 points  Total Score 8 points 6 points    Immunizations Immunization History  Administered Date(s) Administered   Fluad Quad(high Dose 65+) 10/28/2019, 09/19/2021   Fluad Trivalent(High Dose 65+) 09/18/2022   Influenza Split 11/02/2013   Influenza,inj,Quad PF,6+ Mos 11/08/2015, 10/24/2016, 09/07/2017, 09/17/2018   Influenza-Unspecified 11/02/2012   PFIZER(Purple Top)SARS-COV-2 Vaccination 02/28/2019, 03/26/2019, 10/28/2019   Pneumococcal Conjugate-13 12/11/2014   Pneumococcal Polysaccharide-23 05/25/2005, 08/24/2016   Tdap 01/16/2011   Zoster Recombinant(Shingrix) 09/21/2018, 12/18/2018   Zoster, Live 03/02/2013    TDAP status: Up to date  Flu Vaccine status: Up to date  Pneumococcal vaccine status: Up to date  Covid-19 vaccine status: Completed vaccines  Qualifies for Shingles  Vaccine? Yes   Zostavax completed Yes   Shingrix Completed?: Yes  Screening Tests Health Maintenance  Topic Date Due   OPHTHALMOLOGY EXAM  11/25/2020   COVID-19 Vaccine (4 - 2024-25 season) 09/03/2022   Diabetic kidney evaluation - Urine ACR  12/23/2022   FOOT EXAM  12/23/2022   Medicare Annual Wellness (AWV)  01/12/2023   Diabetic kidney evaluation - eGFR measurement  02/01/2023   HEMOGLOBIN A1C  03/18/2023   Pneumonia Vaccine 32+ Years old  Completed  INFLUENZA VACCINE  Completed   Hepatitis C Screening  Completed   Zoster Vaccines- Shingrix  Completed   HPV VACCINES  Aged Out   DTaP/Tdap/Td  Discontinued   Colonoscopy  Discontinued    Health Maintenance  Health Maintenance Due  Topic Date Due   OPHTHALMOLOGY EXAM  11/25/2020   COVID-19 Vaccine (4 - 2024-25 season) 09/03/2022   Diabetic kidney evaluation - Urine ACR  12/23/2022   FOOT EXAM  12/23/2022   Medicare Annual Wellness (AWV)  01/12/2023   Diabetic kidney evaluation - eGFR measurement  02/01/2023    Colorectal cancer screening: No longer required.   Lung Cancer Screening: (Low Dose CT Chest recommended if Age 65-80 years, 20 pack-year currently smoking OR have quit w/in 15years.) does not qualify.   Lung Cancer Screening Referral: no  Additional Screening:  Hepatitis C Screening: does not qualify; Completed 03/10/2015  Vision Screening: Recommended annual ophthalmology exams for early detection of glaucoma and other disorders of the eye. Is the patient up to date with their annual eye exam?  Yes  Who is the provider or what is the name of the office in which the patient attends annual eye exams? Dr Cleotilde If pt is not established with a provider, would they like to be referred to a provider to establish care? No .   Dental Screening: Recommended annual dental exams for proper oral hygiene  Diabetic Foot Exam: Diabetic Foot Exam: Overdue, Pt has been advised about the importance in completing this exam. Pt  is scheduled for diabetic foot exam on next PCP appt:pt to schedule.  Community Resource Referral / Chronic Care Management: CRR required this visit?  No   CCM required this visit?  No offered:pt says he will schedule    Plan:     I have personally reviewed and noted the following in the patient's chart:   Medical and social history Use of alcohol, tobacco or illicit drugs  Current medications and supplements including opioid prescriptions. Patient is not currently taking opioid prescriptions. Functional ability and status Nutritional status Physical activity Advanced directives List of other physicians Hospitalizations, surgeries, and ER visits in previous 12 months Vitals Screenings to include cognitive, depression, and falls Referrals and appointments  In addition, I have reviewed and discussed with patient certain preventive protocols, quality metrics, and best practice recommendations. A written personalized care plan for preventive services as well as general preventive health recommendations were provided to patient.     Erminio LITTIE Saris, LPN   08/02/7972   After Visit Summary: (MyChart) Due to this being a telephonic visit, the after visit summary with patients personalized plan was offered to patient via MyChart   Nurse Notes: Pt states he is doing good other than having diarrhea, n/v since increasing to Ozempic  2. He relays he would like to decrease it or a different medication.Pt to schedule PE in mychart (offered at visit)

## 2023-01-10 NOTE — Patient Instructions (Signed)
 [name] , Thank you for taking time to come for your Medicare Wellness Visit. I appreciate your ongoing commitment to your health goals. Please review the following plan we discussed and let me know if I can assist you in the future.   Referrals/Orders/Follow-Ups/Clinician Recommendations: none  This is a list of the screening recommended for you and due dates:  Health Maintenance  Topic Date Due   Eye exam for diabetics  11/25/2020   COVID-19 Vaccine (4 - 2024-25 season) 09/03/2022   Yearly kidney health urinalysis for diabetes  12/23/2022   Complete foot exam   12/23/2022   Medicare Annual Wellness Visit  01/12/2023   Yearly kidney function blood test for diabetes  02/01/2023   Hemoglobin A1C  03/18/2023   Pneumonia Vaccine  Completed   Flu Shot  Completed   Hepatitis C Screening  Completed   Zoster (Shingles) Vaccine  Completed   HPV Vaccine  Aged Out   DTaP/Tdap/Td vaccine  Discontinued   Colon Cancer Screening  Discontinued    Advanced directives: (Copy Requested) Please bring a copy of your health care power of attorney and living will to the office to be added to your chart at your convenience.  Next Medicare Annual Wellness Visit scheduled for next year: Yes 01/14/2024 @ 3:40pm televisit

## 2023-02-12 ENCOUNTER — Other Ambulatory Visit: Payer: Self-pay | Admitting: Primary Care

## 2023-02-12 DIAGNOSIS — Z794 Long term (current) use of insulin: Secondary | ICD-10-CM

## 2023-02-12 NOTE — Telephone Encounter (Signed)
 Patient is due for CPE/follow up in late March/early April, this will be required prior to any further refills.  Please schedule, thank you!

## 2023-02-13 NOTE — Telephone Encounter (Signed)
Vm box not set up yet, sent mychart message

## 2023-02-13 NOTE — Progress Notes (Signed)
Remote ICD transmission.

## 2023-02-20 ENCOUNTER — Other Ambulatory Visit: Payer: Self-pay | Admitting: Physician Assistant

## 2023-02-26 ENCOUNTER — Encounter: Payer: Medicare Other | Admitting: Primary Care

## 2023-02-27 DIAGNOSIS — I5020 Unspecified systolic (congestive) heart failure: Secondary | ICD-10-CM | POA: Insufficient documentation

## 2023-02-27 NOTE — Progress Notes (Signed)
 Cardiology Office Note:    Date:  02/28/2023  ID:  [name], DOB 1945/06/24, MRN [id number] PCP: Doreene Nest, NP  [facility] Providers Cardiologist:  Donato Schultz, MD Electrophysiologist:  Lanier Prude, MD       Patient Profile:      Coronary artery disease  S/p CABG + Maze procedure + LAA clipping 06/2014 NSTEMI 08/2015 - LHC w S-RCA 100 >> med Rx LHC 01/06/19: L-LAD patent, S-RI/D1 100 CTO, S-RPDA/RPL patent and diffusely dz'd w 90 before S-RPDA anastomosis >> Med Rx (HFrEF) heart failure with reduced ejection fraction Ischemic CM TEE 06/22/14: EF 40-45 TTE 01/10/16: EF 50-55, inf AK TTE 02/13/22: EF 25-30, global HK, mod pulm HTN, RVSP 48.2, mild LAE, mild MR, mild to mod TR, asc aorta 40 mm, RAP 8 S/p AICD 01/2019  Paroxysmal atrial fibrillation  S/p DCCV in 02/2022 Hypertension  Hyperlipidemia   Intol of Zetia (myalgias); hesitant to take PCSK9i Diabetes mellitus  Carotid stenosis [place] 06/19/14: Bilat ICA 1-39           Discussed the use of AI scribe software for clinical note transcription with the patient, who gave verbal consent to proceed. History of Present Illness [name] is a 78 y.o. male who returns for evaluation of shortness of breath. He was last seen by Dr. Eldridge Dace in 04/2022. He is accompanied by his wife.  He has been experiencing shortness of breath and dizziness over the past few weeks, particularly during exertion such as playing golf or carrying items. Approximately six months ago, he had a syncopal episode while playing golf, which he attributed to low blood sugar and did not seek medical attention. No recent episodes of syncope and no chest pain, heaviness, or pressure. He monitors his heart rate and rhythm at home and reports noticing irregularities, although recent checks have shown normal rhythm. No swelling in his legs, he can lay flat without difficulty breathing, and no fevers, cough, or gastrointestinal bleeding. He does not  smoke or use tobacco products. His wife and daughters have noticed his increased shortness of breath and fatigue with minimal exertion.  Review of Systems  Constitutional: Negative for fever.  Respiratory:  Negative for cough.   Gastrointestinal:  Negative for hematochezia and melena.  Genitourinary:  Negative for hematuria.  -See HPI    Studies Reviewed:   EKG Interpretation Date/Time:  Wednesday February 28 2023 08:21:27 EST Ventricular Rate:  79 PR Interval:  194 QRS Duration:  96 QT Interval:  408 QTC Calculation: 467 R Axis:   101  Text Interpretation: Normal sinus rhythm Rightward axis Nonspecific ST and T wave abnormality No significant change since last tracing Confirmed by Tereso Newcomer [id number]) on 02/28/2023 8:23:19 AM    Results LABS - [facility] Total cholesterol: 160 (08/31/2022) HDL: 41 (08/31/2022) LDL: 191 (08/31/2022) Triglycerides: 91 (08/31/2022) Creatinine: 1.18 (08/31/2022) Potassium: 4.6 (08/31/2022) ALT: 13 (08/31/2022)    Risk Assessment/Calculations:    CHA2DS2-VASc Score = 6   This indicates a 9.7% annual risk of stroke. The patient's score is based upon: CHF History: 1 HTN History: 1 Diabetes History: 1 Stroke History: 0 Vascular Disease History: 1 Age Score: 2 Gender Score: 0            Physical Exam:   VS:  BP 128/60   Pulse 79   Ht 5\' 8"  (1.727 m)   Wt 208 lb 3.2 oz (94.4 kg)   SpO2 98%   BMI 31.66 kg/m  Wt Readings from Last 3 Encounters:  02/28/23 208 lb 3.2 oz (94.4 kg)  01/10/23 205 lb (93 kg)  09/18/22 212 lb (96.2 kg)    Constitutional:      Appearance: Healthy appearance. Not in distress.  Neck:     Vascular: No JVR. JVD normal.  Pulmonary:     Breath sounds: Normal breath sounds. No wheezing. No rales.  Cardiovascular:     Normal rate. Regular rhythm.     Murmurs: There is no murmur.  Edema:    Peripheral edema absent.  Abdominal:     Palpations: Abdomen is soft.        Assessment and Plan:   Assessment &  Plan Shortness of breath Increased shortness of breath over the past few weeks, accompanied by dizziness and a single episode of syncope six months ago. History of coronary artery disease, heart failure with reduced ejection fraction, and paroxysmal atrial fibrillation. ECG showed normal sinus rhythm. Differential diagnosis includes recurrent atrial fibrillation, heart failure exacerbation, or ischemic heart disease. Discussed potential need for heart monitor if device interrogation is inconclusive. Explained that if atrial fibrillation is detected, antiarrhythmic medication may be needed to prevent recurrence vs watchful waiting. If no atrial fibrillation, stress test will be ordered to assess for ischemia. We discussed risks and benefits of stress PET scan just in case. - Order echocardiogram - Interrogate AICD device - Order comprehensive metabolic panel, CBC, and BNP - Follow up with device clinic for interrogation results - If no AFib detected, order stress test - If AFib detected, I will d/w EP +/- AAD Rx - Follow up 6-8 weeks.  Coronary artery disease of bypass graft of native heart with stable angina pectoris [facility]) Coronary artery disease with status post CABG in June 2016 and non-STEMI in August 2017. Last heart catheterization in January 2021 showed patent L-LAD, occluded vein graft to the ramus intermediate and D1, and patent vein graft to the RPDA and RPL with diffuse disease and 90% stenosis before the vein graft to the RPDA anastomosis. Managed medically. He has not had chest pain. He has noted dyspnea on exertion and fatigue.  -If no atrial fibrillation on device interrogation, will pursue Stress PET to r/o ischemia.  -Continue carvedilol 6.25 mg twice daily, Imdur 30 mg daily, nitroglycerin as needed Paroxysmal atrial fibrillation (HCC) Paroxysmal atrial fibrillation with cardioversion in February 2024. Today's ECG showed normal sinus rhythm. Minor amounts of atrial fibrillation  detected in previous device interrogations.   - Interrogate AICD device - Continue Eliquis 5 mg twice daily, carvedilol 6.25 mg twice daily - Obtain c-Met, CBC today HFrEF (heart failure with reduced ejection fraction) (HCC) Heart failure with reduced ejection fraction secondary to ischemic cardiomyopathy. EF decreased to 25-30% in February 2024. Mild to moderate TR, mild MR, and moderate pulmonary hypertension with RVSP 48.2. Recent shortness of breath is more suspicious for recurrent atrial fibrillation. Volume status stable on exam. Currently NYHA II. But he was NYHA IIb-III the last couple of weeks. - CMET, CBC, BNP - Continue 6.25 mg twice daily, Farxiga 10 mg daily, Imdur 30 mg daily, Entresto 24/26 mg twice daily Primary hypertension The patient's blood pressure is controlled on his current regimen.  Continue current therapy.   Pure hypercholesterolemia He is intolerant of statins and Zetia.  He is willing to try PCSK9 inhibitor. - Refer to Pharm.D. for initiation of PCSK9 inhibitor. ICD (implantable cardioverter-defibrillator) in place Continue follow-up with EP.  Interrogate device as noted. Syncope and collapse He had  a syncopal episode 6 months ago.  His description sounds fairly consistent with hypoglycemic episode.  However, I will interrogate his device.  If we cannot detect bradycardia arrhythmias or pauses I will have him wear a 30-day monitor.   Informed Consent   Shared Decision Making/Informed Consent The risks [chest pain, shortness of breath, cardiac arrhythmias, dizziness, blood pressure fluctuations, myocardial infarction, stroke/transient ischemic attack, nausea, vomiting, allergic reaction, radiation exposure, metallic taste sensation and life-threatening complications (estimated to be 1 in 10,000)], benefits (risk stratification, diagnosing coronary artery disease, treatment guidance) and alternatives of a cardiac PET stress test were discussed in detail with Mr. Fife  and he agrees to proceed (if we decided to pursue a stress test).     Dispo:  Return in about 8 weeks (around 04/25/2023) for Follow up after testing, w/ Dr. Anne Fu, or Tereso Newcomer, PA-C.  Signed, Tereso Newcomer, PA-C

## 2023-02-28 ENCOUNTER — Ambulatory Visit: Payer: Medicare Other | Attending: Physician Assistant | Admitting: Physician Assistant

## 2023-02-28 ENCOUNTER — Encounter: Payer: Self-pay | Admitting: Physician Assistant

## 2023-02-28 VITALS — BP 128/60 | HR 79 | Ht 68.0 in | Wt 208.2 lb

## 2023-02-28 DIAGNOSIS — I10 Essential (primary) hypertension: Secondary | ICD-10-CM | POA: Diagnosis not present

## 2023-02-28 DIAGNOSIS — I25708 Atherosclerosis of coronary artery bypass graft(s), unspecified, with other forms of angina pectoris: Secondary | ICD-10-CM | POA: Diagnosis not present

## 2023-02-28 DIAGNOSIS — Z95810 Presence of automatic (implantable) cardiac defibrillator: Secondary | ICD-10-CM | POA: Diagnosis not present

## 2023-02-28 DIAGNOSIS — E7800 Pure hypercholesterolemia, unspecified: Secondary | ICD-10-CM

## 2023-02-28 DIAGNOSIS — I480 Paroxysmal atrial fibrillation: Secondary | ICD-10-CM

## 2023-02-28 DIAGNOSIS — R0602 Shortness of breath: Secondary | ICD-10-CM | POA: Diagnosis not present

## 2023-02-28 DIAGNOSIS — I5020 Unspecified systolic (congestive) heart failure: Secondary | ICD-10-CM | POA: Diagnosis not present

## 2023-02-28 DIAGNOSIS — R55 Syncope and collapse: Secondary | ICD-10-CM | POA: Diagnosis not present

## 2023-02-28 LAB — CBC

## 2023-02-28 NOTE — Assessment & Plan Note (Signed)
 He is intolerant of statins and Zetia.  He is willing to try PCSK9 inhibitor. - Refer to Pharm.D. for initiation of PCSK9 inhibitor.

## 2023-02-28 NOTE — Assessment & Plan Note (Signed)
 Heart failure with reduced ejection fraction secondary to ischemic cardiomyopathy. EF decreased to 25-30% in February 2024. Mild to moderate TR, mild MR, and moderate pulmonary hypertension with RVSP 48.2. Recent shortness of breath is more suspicious for recurrent atrial fibrillation. Volume status stable on exam. Currently NYHA II. But he was NYHA IIb-III the last couple of weeks. - CMET, CBC, BNP - Continue 6.25 mg twice daily, Farxiga 10 mg daily, Imdur 30 mg daily, Entresto 24/26 mg twice daily

## 2023-02-28 NOTE — Assessment & Plan Note (Signed)
 Continue follow-up with EP.  Interrogate device as noted.

## 2023-02-28 NOTE — Patient Instructions (Addendum)
 Medication Instructions:  Your physician recommends that you continue on your current medications as directed. Please refer to the Current Medication list given to you today.  *If you need a refill on your cardiac medications before your next appointment, please call your pharmacy*   Lab Work: TODAY:  CMET, PRO BNP, & CBC  If you have labs (blood work) drawn today and your tests are completely normal, you will receive your results only by: MyChart Message (if you have MyChart) OR A paper copy in the mail If you have any lab test that is abnormal or we need to change your treatment, we will call you to review the results.   Testing/Procedures: Your physician has requested that you have an echocardiogram. Echocardiography is a painless test that uses sound waves to create images of your heart. It provides your doctor with information about the size and shape of your heart and how well your heart's chambers and valves are working. This procedure takes approximately one hour. There are no restrictions for this procedure. Please do NOT wear cologne, perfume, aftershave, or lotions (deodorant is allowed). Please arrive 15 minutes prior to your appointment time.  Please note: We ask at that you not bring children with you during ultrasound (echo/ vascular) testing. Due to room size and safety concerns, children are not allowed in the ultrasound rooms during exams. Our front office staff cannot provide observation of children in our lobby area while testing is being conducted. An adult accompanying a patient to their appointment will only be allowed in the ultrasound room at the discretion of the ultrasound technician under special circumstances. We apologize for any inconvenience.   You have been referred to Pharm-D to discuss PCSK9 inhibitor for Choelsterol   Follow-Up: At [facility], you and your health needs are our priority.  As part of our continuing mission to provide you with  exceptional heart care, we have created designated Provider Care Teams.  These Care Teams include your primary Cardiologist (physician) and Advanced Practice Providers (APPs -  Physician Assistants and Nurse Practitioners) who all work together to provide you with the care you need, when you need it.  We recommend signing up for the patient portal called "MyChart".  Sign up information is provided on this After Visit Summary.  MyChart is used to connect with patients for Virtual Visits (Telemedicine).  Patients are able to view lab/test results, encounter notes, upcoming appointments, etc.  Non-urgent messages can be sent to your provider as well.   To learn more about what you can do with MyChart, go to [email or web address].    Your next appointment:   6-8 week(s)  Provider:   Donato Schultz, MD or Tereso Newcomer, PA-C         Other Instructions    WHEN YOU GET HOME, CALL OUR DEVICE NURSE, [phone] SO THEY CAN HELP YOU SEND A REMOTE TRANSMISSION IN OF YOUR DEVICE

## 2023-02-28 NOTE — Assessment & Plan Note (Addendum)
The patient's blood pressure is controlled on his current regimen.  Continue current therapy.  

## 2023-02-28 NOTE — Assessment & Plan Note (Signed)
 Coronary artery disease with status post CABG in June 2016 and non-STEMI in August 2017. Last heart catheterization in January 2021 showed patent L-LAD, occluded vein graft to the ramus intermediate and D1, and patent vein graft to the RPDA and RPL with diffuse disease and 90% stenosis before the vein graft to the RPDA anastomosis. Managed medically. He has not had chest pain. He has noted dyspnea on exertion and fatigue.  -If no atrial fibrillation on device interrogation, will pursue Stress PET to r/o ischemia.  -Continue carvedilol 6.25 mg twice daily, Imdur 30 mg daily, nitroglycerin as needed

## 2023-02-28 NOTE — Assessment & Plan Note (Signed)
 Paroxysmal atrial fibrillation with cardioversion in February 2024. Today's ECG showed normal sinus rhythm. Minor amounts of atrial fibrillation detected in previous device interrogations.   - Interrogate AICD device - Continue Eliquis 5 mg twice daily, carvedilol 6.25 mg twice daily - Obtain c-Met, CBC today

## 2023-03-01 ENCOUNTER — Other Ambulatory Visit: Payer: Self-pay | Admitting: Cardiology

## 2023-03-01 ENCOUNTER — Telehealth: Payer: Self-pay

## 2023-03-01 DIAGNOSIS — I5022 Chronic systolic (congestive) heart failure: Secondary | ICD-10-CM

## 2023-03-01 DIAGNOSIS — I25708 Atherosclerosis of coronary artery bypass graft(s), unspecified, with other forms of angina pectoris: Secondary | ICD-10-CM

## 2023-03-01 DIAGNOSIS — I255 Ischemic cardiomyopathy: Secondary | ICD-10-CM

## 2023-03-01 DIAGNOSIS — R0602 Shortness of breath: Secondary | ICD-10-CM

## 2023-03-01 DIAGNOSIS — R079 Chest pain, unspecified: Secondary | ICD-10-CM

## 2023-03-01 LAB — COMPREHENSIVE METABOLIC PANEL
ALT: 24 [IU]/L (ref 0–44)
AST: 22 [IU]/L (ref 0–40)
Albumin: 4.5 g/dL (ref 3.8–4.8)
Alkaline Phosphatase: 83 [IU]/L (ref 44–121)
BUN/Creatinine Ratio: 13 (ref 10–24)
BUN: 16 mg/dL (ref 8–27)
Bilirubin Total: 0.4 mg/dL (ref 0.0–1.2)
CO2: 20 mmol/L (ref 20–29)
Calcium: 9.7 mg/dL (ref 8.6–10.2)
Chloride: 102 mmol/L (ref 96–106)
Creatinine, Ser: 1.21 mg/dL (ref 0.76–1.27)
Globulin, Total: 2.7 g/dL (ref 1.5–4.5)
Glucose: 291 mg/dL — ABNORMAL HIGH (ref 70–99)
Potassium: 4.8 mmol/L (ref 3.5–5.2)
Sodium: 141 mmol/L (ref 134–144)
Total Protein: 7.2 g/dL (ref 6.0–8.5)
eGFR: 62 mL/min/{1.73_m2} (ref >59)

## 2023-03-01 LAB — CBC
Hematocrit: 49.5 % (ref 37.5–51.0)
Hemoglobin: 16 g/dL (ref 13.0–17.7)
MCH: 30.2 pg (ref 26.6–33.0)
MCHC: 32.3 g/dL (ref 31.5–35.7)
MCV: 94 fL (ref 79–97)
Platelets: 254 10*3/uL (ref 150–450)
RBC: 5.29 x10E6/uL (ref 4.14–5.80)
RDW: 13.4 % (ref 11.6–15.4)
WBC: 8.3 10*3/uL (ref 3.4–10.8)

## 2023-03-01 LAB — PRO B NATRIURETIC PEPTIDE: NT-Pro BNP: 292 pg/mL (ref 0–486)

## 2023-03-01 NOTE — Telephone Encounter (Signed)
 Called pt and asked him to send a transmission per [place] and [place] was [place] golf [place] and not home at the time. Pt will call when he gets home

## 2023-03-01 NOTE — Telephone Encounter (Signed)
 Pt last saw Tereso Newcomer, [place] on 02/28/23, last labs 02/28/23 Creat 1.21, age 78, weight 94.4kg, based on specified criteria pt is on appropriate dosage of Eliquis 5mg  BID for afib.  Will refill rx.

## 2023-03-02 NOTE — Telephone Encounter (Signed)
 I spoke with the pt to try to get a transmission with his monitor but he got the error code [id number]. I called [employer] tech support to get additional help.  [employer] is sending a new handheld in 2-3 business days. I gave the pt the device clinic number for when he receive the new handheld we can get the transmisssion for [facility].

## 2023-03-06 NOTE — Telephone Encounter (Signed)
 I spoke with the pt and he just received the new handheld. I told him to let it charge for 1 hour and we will call him in the morning to send the manual transmission.

## 2023-03-07 NOTE — Telephone Encounter (Signed)
 No answer no voice mail

## 2023-03-08 NOTE — Telephone Encounter (Signed)
 Requested to obtain/review ICD transmission for [facility], PA-C.   Device function normal. AT/AF burden is 0.1%  Device flagged 4 episodes of AF, longest 1 hr 36 mins.  Of note during that episode there is RV lead noise, suspect patient may have been using some sort of electrical equipment. RV sensing, threshold and impedance values stable and WNL.   Histograms show overall controlled V rates except during recorded AF events.  Optivol and Thoracic impedance show concerns for fluid retention.   Patient sees R. Collene Schlichter in EP clinic on 3/12.  Will add to notes to evaluate Optivol,noise on RV at that time and adjust B>AX pathways.

## 2023-03-08 NOTE — Telephone Encounter (Signed)
 Transmission received. Please review the transmission and send the results to [facility].

## 2023-03-09 NOTE — Telephone Encounter (Signed)
 Marland Kitchen

## 2023-03-11 NOTE — Progress Notes (Deleted)
 Cardiology Office Note Date:  03/11/2023  Patient ID:  [name], [name] 05-28-1945, MRN [id number] PCP:  Doreene Nest, NP  Cardiologist:  Dr. Eldridge Dace Electrophysiologist: Dr. Johney Frame >>> pending Dr. Lalla Brothers    Chief Complaint:  *** RV noise, optiVol, B>AX needs  History of Present Illness: [name] is a 78 y.o. male with history of  MI, CAD (PCI 2016, CABG 2016, NSTEMI 2017 > occl RCA graft medically managed, 2021 cath with some progressive/relatively stable CAD no intervention),  HTN, HLD, DM,  AFib (MAZE and LAA clip w/CABG in 2016),  ICM w/ICD  He last saw Dr. Johney Frame Aug 2022, feeling well, felt to be euvolemic, low Afib burden, maintained on OAC, otherwise meds deferred to Dr. Janora Norlander.  He last saw Dr. Eldridge Dace 04/27/21, feeling well, lipids meds stopped historically 2/2 myalgias, planned for lipid clinic referral, no changes otherwise   A remote, noted AFib burden 95.9% Dr. Lalla Brothers recommended he come in and review AF management strategies. I saw him 01/31/22 He reports feeling well. He was surprised to hear he was having AFib because he felt so well. He denies any CP, palpitations or cardiac awareness of any kind No SOB, DOE No dizzy spells, near syncope or syncope. He reports good medication compliance, no missed doses of any medicines, particularly asked about his Eliquis, he is certain no missed doses in 3 weeks, even longer if ever has Discussed management strategies AAD options  amiodaone and Tikosyn are options Planned an  echo to assess structure/function, help guide rhythm management strategies, +/- ablation candidacy Started w/DCCV  DCCV > SR on 02/21/22  Saw Dr. Lalla Brothers 04/11/22, feeling better post DCCV, no Afib post CV, not felt to be ablation candidate given comorbidities, severe CAD and history of MAZE  Discussed Tikosyn as 1st choice of AAD > amio Planned to monitor burden without AAD  [name], PA on 02/28/23 most recently, c/o 6 mo  increased SOB, dizziness, one episode of syncope (6 mo prior), planned for a remote to be done to assess AFib burden If no AFib planned for stress PET Updated echo ordered, labs as well [name] for PCSK9i  Device remote: OptiVol had been elevated though had sharply returned all the way to baseline recently AF burden 0.1% Incidentally noting RV noise (not identified by the device > not associated with VS) Also not he needed HV tx adjusted B>AX Appt with EP app made to address these issues  Echo pending (3/13/)  TODAY  *** B>AX *** RV lead noise??? *** more syncope? *** defer ischemic eval to scott   Device information MDT single chamber ICD (Visia AF) implanted 01/07/19   Past Medical History:  Diagnosis Date   Arthritis    "fingers, shoulders" (08/03/2015)   Atrial fibrillation (HCC)    Paroxysmal, rare episodes, sinus rhythm on flecainide, patient prefers not to take Coumadin   Bradycardia    April, 2013   Chest pain    Nuclear, 2006, no ischemia   Chronic lower back pain    "all my life"   Coronary artery disease    s/p staged cath 05/12/2014 and 5/11, DES x 2 to heavily calcified RCA, residual with 60% prox LAD, 70% D1   Ejection fraction    EF 55-60%,  echo, January, 2011   Elevated CPK    CPK elevated with normal MB and normal troponin the past   Heart murmur    History of echocardiogram  Echo 8/16: EF 55%, normal wall motion, grade 2 diastolic dysfunction, trivial MR, normal RV function, PASP 27 mmHg   Hypertension    IBS (irritable bowel syndrome)    Myocardial infarction (HCC) 06/2014   Sinus drainage    Type II diabetes mellitus (HCC)     Past Surgical History:  Procedure Laterality Date   APPENDECTOMY     CARDIAC CATHETERIZATION N/A 05/12/2014   Procedure: Left Heart Cath and Coronary Angiography;  Surgeon: Lennette Bihari, MD;  Location: [facility];  Service: Cardiovascular;  Laterality: N/A;   CARDIAC CATHETERIZATION N/A 05/13/2014    Procedure: Coronary/Graft Atherectomy;  Surgeon: Lennette Bihari, MD;  Location: [facility];  Service: Cardiovascular;  Laterality: N/A;   CARDIAC CATHETERIZATION Right 05/13/2014   Procedure: Temporary Pacemaker;  Surgeon: Lennette Bihari, MD;  Location: [facility];  Service: Cardiovascular;  Laterality: Right;   CARDIAC CATHETERIZATION N/A 05/13/2014   Procedure: Coronary Stent Intervention;  Surgeon: Lennette Bihari, MD;  Location: [facility];  Service: Cardiovascular;  Laterality: N/A;   CARDIAC CATHETERIZATION N/A 06/18/2014   Procedure: Left Heart Cath and Coronary Angiography;  Surgeon: Peter M Swaziland, MD;  Location: [facility] INVASIVE CV LAB;  Service: Cardiovascular;  Laterality: N/A;   CARDIAC CATHETERIZATION N/A 08/03/2015   Procedure: Left Heart Cath and Cors/Grafts Angiography;  Surgeon: Yvonne Kendall, MD;  Location: [facility] INVASIVE CV LAB;  Service: Cardiovascular;  Laterality: N/A;   CARDIOVASCULAR STRESS TEST  05/06/2014   CARDIOVERSION N/A 02/21/2022   Procedure: CARDIOVERSION;  Surgeon: Meriam Sprague, MD;  Location: [facility] ENDOSCOPY;  Service: Cardiovascular;  Laterality: N/A;   CATARACT EXTRACTION W/ INTRAOCULAR LENS IMPLANT Left    COLONOSCOPY WITH PROPOFOL N/A 02/13/2020   Procedure: COLONOSCOPY WITH PROPOFOL;  Surgeon: Jeani Hawking, MD;  Location: [facility] ENDOSCOPY;  Service: Endoscopy;  Laterality: N/A;   CORONARY ARTERY BYPASS GRAFT N/A 06/22/2014   Procedure: CORONARY ARTERY BYPASS GRAFTING (CABG) x five, using left internal mammary artery, and right leg greater saphenous vein harvested endoscopically;  Surgeon: Loreli Slot, MD;  Location: [facility] OR;  Service: Open Heart Surgery;  Laterality: N/A;   ICD IMPLANT N/A 01/07/2019   Procedure: ICD IMPLANT;  Surgeon: Hillis Range, MD;  Location: [facility] CV LAB;  Service: Cardiovascular;  Laterality: N/A;   LAPAROSCOPIC CHOLECYSTECTOMY     LEFT HEART CATH AND CORS/GRAFTS ANGIOGRAPHY N/A 01/06/2019   Procedure: LEFT  HEART CATH AND CORS/GRAFTS ANGIOGRAPHY;  Surgeon: Yvonne Kendall, MD;  Location: [facility];  Service: Cardiovascular;  Laterality: N/A;   MAZE N/A 06/22/2014   Procedure: MAZE;  Surgeon: Loreli Slot, MD;  Location: [facility] OR;  Service: Open Heart Surgery;  Laterality: N/A;   POLYPECTOMY  02/13/2020   Procedure: POLYPECTOMY;  Surgeon: Jeani Hawking, MD;  Location: [facility] ENDOSCOPY;  Service: Endoscopy;;   TEE WITHOUT CARDIOVERSION N/A 06/22/2014   Procedure: TRANSESOPHAGEAL ECHOCARDIOGRAM (TEE);  Surgeon: Loreli Slot, MD;  Location: [facility] OR;  Service: Open Heart Surgery;  Laterality: N/A;    Current Outpatient Medications  Medication Sig Dispense Refill   albuterol (VENTOLIN HFA) 108 (90 Base) MCG/ACT inhaler Inhale 2 puffs into the lungs every 6 (six) hours as needed for wheezing or shortness of breath. 8 g 3   carvedilol (COREG) 6.25 MG tablet TAKE 1 TABLET BY MOUTH TWICE A DAY WITH FOOD 180 tablet 1   Continuous Glucose Sensor (FREESTYLE LIBRE 3 SENSOR) MISC PLACE 1 SENSOR ON THE SKIN EVERY 14  DAYS. USE TO CHECK GLUCOSE CONTINUOUSLY 6 each 1   dapagliflozin propanediol (FARXIGA) 10 MG TABS tablet Take 1 tablet (10 mg total) by mouth daily before breakfast. For diabetes. 90 tablet 1   ELIQUIS 5 MG TABS tablet TAKE 1 TABLET BY MOUTH TWICE A DAY 60 tablet 5   insulin aspart (NOVOLOG FLEXPEN) 100 UNIT/ML FlexPen Inject 20 Units into the skin 3 (three) times daily with meals.     insulin degludec (TRESIBA FLEXTOUCH) 200 UNIT/ML FlexTouch Pen Inject 100 Units into the skin daily. Via Novo Cares PAP 36 mL 0   Insulin Pen Needle 31G X 8 MM MISC Use as directed with Lantus. 100 each 5   isosorbide mononitrate (IMDUR) 30 MG 24 hr tablet TAKE 1 TABLET BY MOUTH EVERY DAY 90 tablet 2   nitroGLYCERIN (NITROSTAT) 0.4 MG SL tablet Place 1 tablet (0.4 mg total) under the tongue every 5 (five) minutes as needed for chest pain. 25 tablet 6   ondansetron (ZOFRAN-ODT) 4 MG disintegrating tablet  Take 1 tablet (4 mg total) by mouth every 8 (eight) hours as needed for nausea or vomiting. 15 tablet 0   OVER THE COUNTER MEDICATION Place 1 drop into both eyes 2 (two) times daily. Happy body  liquid msm eye drops/ for floaters in the eyes     sacubitril-valsartan (ENTRESTO) 24-26 MG TAKE 1 TABLET BY MOUTH TWICE A DAY 60 tablet 0   Semaglutide, 1 MG/DOSE, 4 MG/3ML SOPN Inject 1 mg as directed once a week. for diabetes.     tamsulosin (FLOMAX) 0.4 MG CAPS capsule TAKE 1 CAPSULE (0.4 MG TOTAL) BY MOUTH DAILY. FOR URINE FLOW. 90 capsule 3   No current facility-administered medications for this visit.    Allergies:   Lipitor [atorvastatin], Pravastatin, and Simvastatin   Social History:  The patient  reports that he has never smoked. He has never used smokeless tobacco. He reports that he does not drink alcohol and does not use drugs.   Family History:  The patient's family history includes Alzheimer's disease in his mother; Arrhythmia in his sister; Cancer in his brother; Emphysema in his father; Heart attack in his sister; Heart disease in his sister; Hyperlipidemia in his sister; Hypertension in his sister.  ROS:  Please see the history of present illness.    All other systems are reviewed and otherwise negative.   PHYSICAL EXAM:  VS:  There were no vitals taken for this visit. BMI: There is no height or weight on file to calculate BMI. Well nourished, well developed, in no acute distress HEENT: normocephalic, atraumatic Neck: no JVD, carotid bruits or masses Cardiac: ***; no significant murmurs, no rubs, or gallops Lungs:  *** CTA b/l, no wheezing, rhonchi or rales Abd: soft, nontender MS: no deformity or atrophy Ext: *** no edema Skin: warm and dry, no rash Neuro:  No gross deficits appreciated Psych: euthymic mood, full affect  *** ICD site is stable, no tethering or discomfort   EKG:  Done today and reviewed by myself shows  ***  Device interrogation done today and  reviewed by myself:  *** Battery and lead measurements are good ***   TTE 02/13/22: EF 25-30, global HK, mod pulm HTN, RVSP 48.2, mild LAE, mild MR, mild to mod TR, asc aorta 40 mm, RAP 8    01/06/19: TTE 1. Left ventricular ejection fraction, by visual estimation, is 35 to  40%. The left ventricle has moderately decreased function. There is  moderately increased left ventricular hypertrophy.  2. Left ventricular diastolic parameters are consistent with Grade III  diastolic dysfunction (restrictive).   3. The left ventricle has no regional wall motion abnormalities.   4. Global right ventricle has moderately reduced systolic function.The  right ventricular size is moderately enlarged. No increase in right  ventricular wall thickness.   5. Left atrial size was normal.   6. Right atrial size was normal.   7. The mitral valve is normal in structure. Mild mitral valve  regurgitation.   8. The tricuspid valve is grossly normal.   9. The aortic valve is normal in structure. Aortic valve regurgitation is  not visualized. No evidence of aortic valve sclerosis or stenosis.  10. The pulmonic valve was grossly normal. Pulmonic valve regurgitation is  not visualized.  11. Moderately elevated pulmonary artery systolic pressure.  12. The atrial septum is grossly normal.    01/06/19: [facility] Conclusions: Severe native coronary artery disease, as detailed below, not significant changed from prior catheterization in 2017. Widely patent LIMA-LAD. Patent but small and diffusely diseased SVG-rPDA-rPL, with a focal 90% stenosis just before the SVG-rPDA anastomosis.  Graft appearance is similar to prior catheterization in 2017. Chronically occluded SVG-ramus-D1. Mildly elevated left ventricular filling pressure.   Recommendations: Given stable appearance of coronary artery disease and negligible troponin, I do not believe that worsening CAD is driving his dizziness/syncope.  Further evaluation for  potential arrhythmic cause is recommended. Continue medical therapy and aggressive secondary prevention. Restart apixaban tomorrow if no evidence of bleeding/vascular complications.  Recent Labs: 03/30/2022: TSH 1.75 02/28/2023: ALT 24; BUN 16; Creatinine, Ser 1.21; Hemoglobin 16.0; NT-Pro BNP 292; Platelets 254; Potassium 4.8; Sodium 141  No results found for requested labs within last 365 days.   Estimated Creatinine Clearance: 57 mL/min (by C-G formula based on SCr of 1.21 mg/dL).   Wt Readings from Last 3 Encounters:  02/28/23 208 lb 3.2 oz (94.4 kg)  01/10/23 205 lb (93 kg)  09/18/22 212 lb (96.2 kg)     Other studies reviewed: Additional studies/records reviewed today include: summarized above  ASSESSMENT AND PLAN:  ICD ***  Persistent AFib CHA2DS2Vasc is 6, on eliquis, *** appropriately dosed *** % burden   CAD  *** No anginal symptoms C/w cardiology team  ICM Chronic CHF (systolic) No symptoms or exam findings of volume OL *** % RV pacing *** optiVol looks good     Disposition: ***  Current medicines are reviewed at length with the patient today.  The patient did not have any concerns regarding medicines.  Norma Fredrickson, PA-C 03/11/2023 9:51 AM     [facility] [address] [place] [address] [phone] (office)  [phone] (fax)

## 2023-03-11 NOTE — Telephone Encounter (Signed)
 Reviewed his device interrogation with device clinic. He had 0.1% AFib burden sine last interrogation. His Optivol did show a period of excess fluid that appears to correspond to the period he noted shortness of breath. This has returned to baseline. I suspect his shortness of breath was all related to excess volume. He does not have Lasix on his list. I think we should give him some to have to take as needed. He has follow up with Francis Dowse, PA-C this week. He should keep this appt and I will fwd this note to [facility] as well. Prior interrogations showed fairly stable impedance measurements (fluid measurements). Echocardiogram is pending. Since he did not have atrial fibrillation, I think we should go ahead and pursue the stress test that I discussed with him during his recent office visit.  PLAN: -Arrange Stress PET -Start Lasix 20 mg once daily as needed for weight gain of 3 lbs or more in 1 day or 5 lbs or more in 1 week. -Follow up as planned. [name] Newcomer, PA-C    03/11/2023 3:23 PM

## 2023-03-12 ENCOUNTER — Encounter: Payer: Self-pay | Admitting: *Deleted

## 2023-03-12 MED ORDER — FUROSEMIDE 20 MG PO TABS: ORAL_TABLET | ORAL | 1 refills | Status: DC

## 2023-03-12 NOTE — Telephone Encounter (Signed)
 Pt has been made aware to start Lasix 20 mg taking only as needed for weight gain of 3 lbs in 1 day or 5 lbs in 1 week.  He is aware we will proceed with the Cardiac PET scan since his interrogation didn't show any A-fib.  Pt agree.  Will type up instruction letter for pt's Cardiac PET to pick up at his appt with Renee on Wednesday.  Pt verbalized understanding.

## 2023-03-14 ENCOUNTER — Ambulatory Visit: Payer: Medicare Other | Admitting: Physician Assistant

## 2023-03-15 ENCOUNTER — Ambulatory Visit (HOSPITAL_COMMUNITY): Payer: Medicare Other | Attending: Internal Medicine

## 2023-03-15 DIAGNOSIS — I5020 Unspecified systolic (congestive) heart failure: Secondary | ICD-10-CM | POA: Insufficient documentation

## 2023-03-15 DIAGNOSIS — I25708 Atherosclerosis of coronary artery bypass graft(s), unspecified, with other forms of angina pectoris: Secondary | ICD-10-CM | POA: Diagnosis not present

## 2023-03-15 DIAGNOSIS — Z95810 Presence of automatic (implantable) cardiac defibrillator: Secondary | ICD-10-CM | POA: Diagnosis not present

## 2023-03-15 DIAGNOSIS — I10 Essential (primary) hypertension: Secondary | ICD-10-CM | POA: Insufficient documentation

## 2023-03-15 DIAGNOSIS — I480 Paroxysmal atrial fibrillation: Secondary | ICD-10-CM | POA: Insufficient documentation

## 2023-03-15 DIAGNOSIS — E7800 Pure hypercholesterolemia, unspecified: Secondary | ICD-10-CM | POA: Diagnosis not present

## 2023-03-15 LAB — ECHOCARDIOGRAM COMPLETE
Area-P 1/2: 3.67 cm2
S' Lateral: 4.1 cm

## 2023-03-18 DIAGNOSIS — R059 Cough, unspecified: Secondary | ICD-10-CM | POA: Diagnosis not present

## 2023-03-18 DIAGNOSIS — R0981 Nasal congestion: Secondary | ICD-10-CM | POA: Diagnosis not present

## 2023-03-18 DIAGNOSIS — J209 Acute bronchitis, unspecified: Secondary | ICD-10-CM | POA: Diagnosis not present

## 2023-03-19 ENCOUNTER — Telehealth: Payer: Self-pay | Admitting: *Deleted

## 2023-03-19 DIAGNOSIS — I77819 Aortic ectasia, unspecified site: Secondary | ICD-10-CM

## 2023-03-19 NOTE — Telephone Encounter (Signed)
-----   Message from Nurse Corky Crafts sent at 03/19/2023 12:26 PM EDT -----  ----- Message ----- From: Kennon Rounds Sent: 03/16/2023  12:58 PM EDT To: [address] Triage  Result note sent to [name] via MyChart. See comments below. PLAN:  -Repeat echo 1 year.  Mr. Kozak  Your echocardiogram demonstrates somewhat improved heart function (ejection fraction) since the last study done in February 2024.  Normal is 60%.  It was 25-30% in February 2024.  It is currently 35-40%.  The aorta is mildly dilated at 38 mm.  This is stable compared to 2024.  We can continue to follow this with another echocardiogram in 1 year. Tereso Newcomer, PA-C    03/16/2023 12:54 PM

## 2023-03-20 ENCOUNTER — Other Ambulatory Visit: Payer: Self-pay | Admitting: Cardiology

## 2023-03-23 NOTE — Progress Notes (Signed)
  Electrophysiology Office Note:   ID:  [name], DOB 1945/08/24, MRN [id number]  Primary Cardiologist: Donato Schultz, MD Electrophysiologist: Lanier Prude, MD      History of Present Illness:   [name] is a 78 y.o. male with h/o MI, CAD (PCI 2016, CABG 2016, NSTEMI 2017 > occl RCA graft medically managed, 2021 cath with some progressive/relatively stable CAD no intervention), HTN, HLD, DM,  AFib (MAZE and LAA clip w/CABG in 2016), ICM w/ICD seen today for acute visit due to concerns for RV lead noise and programming changes.    Seen by gen cards PA on 02/28/23 with c/o 6 mo increased SOB, dizziness, one episode of syncope (6 mo prior). Planned to assess AF burden,  If no AFib planned for stress PET Updated echo ordered, labs as well [facility] team for PCSK9i  Echo showed some improvement in EF, and is pending Stress 05/23/2023  Patient reports well today. He does have some SOB with moderate exertion. No further syncope. Otherwise, he denies chest pain, palpitations, PND, orthopnea, nausea, vomiting, dizziness, syncope, edema, weight gain, or early satiety.   Review of systems complete and found to be negative unless listed in HPI.   EP Information / Studies Reviewed:    EKG is not ordered today. EKG from 02/28/2023 reviewed which showed NSR at 79 bpm       ICD Interrogation-  reviewed in detail today,  See PACEART report.  Arrhythmia/Device History MDT single chamber ICD (Visia AF) implanted 01/07/19   Echo 03/15/2023 EF 35-40%, Aorta 38 mm  Physical Exam:   VS:  BP (!) 142/80   Pulse 77   Ht 5\' 8"  (1.727 m)   Wt 204 lb 12.8 oz (92.9 kg)   SpO2 99%   BMI 31.14 kg/m    Wt Readings from Last 3 Encounters:  03/27/23 204 lb 12.8 oz (92.9 kg)  02/28/23 208 lb 3.2 oz (94.4 kg)  01/10/23 205 lb (93 kg)     GEN: No acute distress  NECK: No JVD; No carotid bruits CARDIAC: Regular rate and rhythm, no murmurs, rubs, gallops RESPIRATORY:  Clear to auscultation without rales,  wheezing or rhonchi  ABDOMEN: Soft, non-tender, non-distended EXTREMITIES:  No edema; No deformity   ASSESSMENT AND PLAN:    Chronic systolic CHF  s/p Medtronic single chamber ICD  euvolemic today Stable on an appropriate medical regimen Normal ICD function See Pace Art report No changes today  CAD Denies s/s ischemia EF improved to 35-40% compared to 02/2022 (25-30%)  Paroxysmal AF 0.1% burden Continue eliquis for CHA2DS2VASc of at least 6   RV lead noise Reproducible with palms in and pressed towards each other, but NOT sensed.  Other parameters are stable.  Reviewed alarm symptoms and precautions were he to get an inappropriate shock.   Disposition:   Follow up with Dr. Lalla Brothers in 6 months  Signed, Graciella Freer, PA-C

## 2023-03-27 ENCOUNTER — Ambulatory Visit: Attending: Student | Admitting: Student

## 2023-03-27 ENCOUNTER — Encounter: Payer: Self-pay | Admitting: Student

## 2023-03-27 VITALS — BP 142/80 | HR 77 | Ht 68.0 in | Wt 204.8 lb

## 2023-03-27 DIAGNOSIS — I25708 Atherosclerosis of coronary artery bypass graft(s), unspecified, with other forms of angina pectoris: Secondary | ICD-10-CM | POA: Diagnosis not present

## 2023-03-27 DIAGNOSIS — I480 Paroxysmal atrial fibrillation: Secondary | ICD-10-CM

## 2023-03-27 DIAGNOSIS — I5022 Chronic systolic (congestive) heart failure: Secondary | ICD-10-CM

## 2023-03-27 DIAGNOSIS — I255 Ischemic cardiomyopathy: Secondary | ICD-10-CM | POA: Diagnosis not present

## 2023-03-27 LAB — CUP PACEART INCLINIC DEVICE CHECK
Battery Remaining Longevity: 100 mo
Battery Voltage: 3.01 V
Brady Statistic RV Percent Paced: 0.02 %
Date Time Interrogation Session: 20250325122257
HighPow Impedance: 80 Ohm
Implantable Lead Connection Status: 753985
Implantable Lead Implant Date: 20210105
Implantable Lead Location: 753860
Implantable Pulse Generator Implant Date: 20210105
Lead Channel Impedance Value: 437 Ohm
Lead Channel Impedance Value: 513 Ohm
Lead Channel Pacing Threshold Amplitude: 0.5 V
Lead Channel Pacing Threshold Pulse Width: 0.4 ms
Lead Channel Sensing Intrinsic Amplitude: 11.625 mV
Lead Channel Sensing Intrinsic Amplitude: 12 mV
Lead Channel Setting Pacing Amplitude: 2.5 V
Lead Channel Setting Pacing Pulse Width: 0.4 ms
Lead Channel Setting Sensing Sensitivity: 0.3 mV
Zone Setting Status: 755011
Zone Setting Status: 755011

## 2023-03-27 NOTE — Patient Instructions (Signed)
 Medication Instructions:  Your physician recommends that you continue on your current medications as directed. Please refer to the Current Medication list given to you today.  *If you need a refill on your cardiac medications before your next appointment, please call your pharmacy*  Lab Work: None ordered If you have labs (blood work) drawn today and your tests are completely normal, you will receive your results only by: MyChart Message (if you have MyChart) OR A paper copy in the mail If you have any lab test that is abnormal or we need to change your treatment, we will call you to review the results.  Follow-Up: At [facility], you and your health needs are our priority.  As part of our continuing mission to provide you with exceptional heart care, we have created designated Provider Care Teams.  These Care Teams include your primary Cardiologist (physician) and Advanced Practice Providers (APPs -  Physician Assistants and Nurse Practitioners) who all work together to provide you with the care you need, when you need it.  Your next appointment:   6 month(s)  Provider:   Steffanie Dunn, MD only

## 2023-03-31 ENCOUNTER — Encounter: Payer: Self-pay | Admitting: Cardiology

## 2023-04-03 ENCOUNTER — Other Ambulatory Visit: Payer: Self-pay | Admitting: Physician Assistant

## 2023-04-06 ENCOUNTER — Ambulatory Visit (INDEPENDENT_AMBULATORY_CARE_PROVIDER_SITE_OTHER): Payer: Medicare Other

## 2023-04-06 DIAGNOSIS — I255 Ischemic cardiomyopathy: Secondary | ICD-10-CM

## 2023-04-07 ENCOUNTER — Encounter: Payer: Self-pay | Admitting: Cardiology

## 2023-04-07 LAB — CUP PACEART REMOTE DEVICE CHECK
Battery Remaining Longevity: 100 mo
Battery Voltage: 2.99 V
Brady Statistic RV Percent Paced: 0.03 %
Date Time Interrogation Session: 20250404043824
HighPow Impedance: 72 Ohm
Implantable Lead Connection Status: 753985
Implantable Lead Implant Date: 20210105
Implantable Lead Location: 753860
Implantable Pulse Generator Implant Date: 20210105
Lead Channel Impedance Value: 380 Ohm
Lead Channel Impedance Value: 437 Ohm
Lead Channel Pacing Threshold Amplitude: 0.5 V
Lead Channel Pacing Threshold Pulse Width: 0.4 ms
Lead Channel Sensing Intrinsic Amplitude: 11.5 mV
Lead Channel Sensing Intrinsic Amplitude: 11.5 mV
Lead Channel Setting Pacing Amplitude: 2.5 V
Lead Channel Setting Pacing Pulse Width: 0.4 ms
Lead Channel Setting Sensing Sensitivity: 0.3 mV
Zone Setting Status: 755011
Zone Setting Status: 755011

## 2023-04-10 ENCOUNTER — Ambulatory Visit: Payer: Medicare Other | Admitting: Primary Care

## 2023-04-10 ENCOUNTER — Encounter: Payer: Self-pay | Admitting: Primary Care

## 2023-04-10 VITALS — BP 104/60 | HR 68 | Temp 98.6°F | Ht 68.0 in | Wt 206.0 lb

## 2023-04-10 DIAGNOSIS — E1122 Type 2 diabetes mellitus with diabetic chronic kidney disease: Secondary | ICD-10-CM | POA: Diagnosis not present

## 2023-04-10 DIAGNOSIS — I5020 Unspecified systolic (congestive) heart failure: Secondary | ICD-10-CM | POA: Diagnosis not present

## 2023-04-10 DIAGNOSIS — I10 Essential (primary) hypertension: Secondary | ICD-10-CM | POA: Diagnosis not present

## 2023-04-10 DIAGNOSIS — N182 Chronic kidney disease, stage 2 (mild): Secondary | ICD-10-CM

## 2023-04-10 DIAGNOSIS — Z0000 Encounter for general adult medical examination without abnormal findings: Secondary | ICD-10-CM | POA: Diagnosis not present

## 2023-04-10 DIAGNOSIS — Z23 Encounter for immunization: Secondary | ICD-10-CM | POA: Diagnosis not present

## 2023-04-10 DIAGNOSIS — Z95810 Presence of automatic (implantable) cardiac defibrillator: Secondary | ICD-10-CM

## 2023-04-10 DIAGNOSIS — E7800 Pure hypercholesterolemia, unspecified: Secondary | ICD-10-CM

## 2023-04-10 DIAGNOSIS — I480 Paroxysmal atrial fibrillation: Secondary | ICD-10-CM | POA: Diagnosis not present

## 2023-04-10 DIAGNOSIS — Z794 Long term (current) use of insulin: Secondary | ICD-10-CM | POA: Diagnosis not present

## 2023-04-10 DIAGNOSIS — N400 Enlarged prostate without lower urinary tract symptoms: Secondary | ICD-10-CM

## 2023-04-10 DIAGNOSIS — I25708 Atherosclerosis of coronary artery bypass graft(s), unspecified, with other forms of angina pectoris: Secondary | ICD-10-CM | POA: Diagnosis not present

## 2023-04-10 LAB — LIPID PANEL
Cholesterol: 185 mg/dL (ref 0–200)
HDL: 42.4 mg/dL (ref >39.00)
LDL Cholesterol: 127 mg/dL — ABNORMAL HIGH (ref 0–99)
NonHDL: 142.3
Total CHOL/HDL Ratio: 4
Triglycerides: 79 mg/dL (ref 0.0–149.0)
VLDL: 15.8 mg/dL (ref 0.0–40.0)

## 2023-04-10 LAB — HEMOGLOBIN A1C: Hgb A1c MFr Bld: 9.5 % — ABNORMAL HIGH (ref 4.6–6.5)

## 2023-04-10 NOTE — Assessment & Plan Note (Signed)
 Following with cardiology, office notes reviewed from February 2025 and March 2025. Remote ICD review from April 2025 reviewed.  Continue carvedilol 6.25 mg twice daily, Eliquis 5 mg twice daily.

## 2023-04-10 NOTE — Assessment & Plan Note (Signed)
 Statin and Zetia intolerant. Following with cardiology for PCSK9 inhibitor.

## 2023-04-10 NOTE — Assessment & Plan Note (Signed)
 Reviewed recent remote read from April 2025.

## 2023-04-10 NOTE — Assessment & Plan Note (Signed)
 Uncontrolled based on Freestyle Libre 3+ report. Reviewed patients Freestyle APP as well.   Continue Tresiba 200 units/ml, 100 units daily. Continue Novolog 20 units TID with meals. Continue Ozempic 1 mg weekly, he could not tolerate 2 mg dose. Continue Farxiga 10 mg daily.  Again, we extensively went over his diet and a proper diabetes diet. Again,he was offered a diabetes nutritionist referral for which he declines.    Will connect him with our pharmacy team.   Close follow up in 3 months.

## 2023-04-10 NOTE — Assessment & Plan Note (Signed)
 Appears euvolemic today.  Following with cardiology, office notes reviewed from February 2025 in March 2025.  Continue carvedilol 6.25 mg twice daily, Farxiga 10 mg daily, Imdur 30 mg daily, Entresto 24/26 mg twice daily, furosemide 20 mg as needed.

## 2023-04-10 NOTE — Assessment & Plan Note (Signed)
 Stable.  Following with cardiology, office notes reviewed from February 2025 and March 2025. Continue carvedilol 6.25 mg twice daily, Imdur 30 mg daily, nitroglycerin as needed.  Also continue diabetes, lipid, blood pressure control.

## 2023-04-10 NOTE — Assessment & Plan Note (Signed)
Controlled.  Continue tamsulosin 0.4 mg daily. 

## 2023-04-10 NOTE — Patient Instructions (Addendum)
 It is important that you improve your diet. Please limit carbohydrates in the form of white bread, rice, pasta, sweets, fast food, fried food, sugary drinks, etc. Increase your consumption of fresh fruits and vegetables, whole grains, lean protein.  Ensure you are consuming 64 ounces of water daily.  I will have the pharmacist reach out to you soon.  Stop by the lab prior to leaving today. I will notify you of your results once received.   Please schedule a follow up visit for 3 months.  It was a pleasure to see you today!

## 2023-04-10 NOTE — Progress Notes (Signed)
 Subjective:    Patient ID: [name], male    DOB: [birth year], 78 y.o.   MRN: [id number]  HPI  [name] is a very pleasant 78 y.o. male with a history of paroxysmal atrial fibrillation, hypertension, CAD, cardiomyopathy, ICD in place, type 2 diabetes, BPH, hyperlipidemia, [name], insomnia who presents today for complete physical and follow up of chronic conditions.  He continues to experience hypoglycemic episodes, drops into the low 60's and mid 50s. This occurs 2 times weekly on average. He injects Novolog 20 units three times daily with meals. He continues to inject 100 units of Tresiba daily and Ozempic 1 mg weekly.   Time in range over last 14 days 31%, high 30%, very high 38%, low, 1% Time in range over last 30 days: 32%, high 30%, very high 37%, low 1% Time in range over last 90 days: 31%, high 26%, very high 43%, low 0%  Diet currently consists of:  Breakfast: Sausage biscuits, eggs, bacon Lunch: Meat, veggie, starch, sometimes skips Dinner: Pizza, meat, potatoes, pasta, rice, veggie Snacks: Candy bars when glucose drops, potato chips Desserts: Candy bars when glucose drops Beverages: Water, diet coke   Immunizations: -Tetanus: Completed in 2013 -Influenza: Completed last season -Shingles: Completed Shingrix series -Pneumonia: Completed Prevnar 13 in 2016, Pneumovax 23 in 2018    Exercise: Golf, currently active with his move to another home.   Eye exam: Completes annually  Dental exam: Completes semi-annually    Colonoscopy: Completed in 2022, due 2027 if clinically appropriate  PSA: Discontinue given age. He agrees.   BP Readings from Last 3 Encounters:  04/10/23 104/60  03/27/23 (!) 142/80  02/28/23 128/60   Wt Readings from Last 3 Encounters:  04/10/23 206 lb (93.4 kg)  03/27/23 204 lb 12.8 oz (92.9 kg)  02/28/23 208 lb 3.2 oz (94.4 kg)      Review of Systems  Constitutional:  Negative for unexpected weight change.  HENT:  Negative for  rhinorrhea.   Respiratory:  Negative for cough and shortness of breath.   Cardiovascular:  Negative for chest pain.  Gastrointestinal:  Negative for constipation and diarrhea.  Genitourinary:  Negative for difficulty urinating.  Musculoskeletal:  Negative for arthralgias and myalgias.  Skin:  Negative for rash.  Allergic/Immunologic: Negative for environmental allergies.  Neurological:  Negative for dizziness and headaches.  Psychiatric/Behavioral:  The patient is not nervous/anxious.          Past Medical History:  Diagnosis Date   Arthritis    "fingers, shoulders" (08/03/2015)   Atrial fibrillation (HCC)    Paroxysmal, rare episodes, sinus rhythm on flecainide, patient prefers not to take Coumadin   Bradycardia    April, 2013   Chest pain    Nuclear, 2006, no ischemia   Chronic lower back pain    "all my life"   Coronary artery disease    s/p staged cath 05/12/2014 and 5/11, DES x 2 to heavily calcified RCA, residual with 60% prox LAD, 70% D1   Ejection fraction    EF 55-60%,  echo, January, 2011   Elevated CPK    CPK elevated with normal MB and normal troponin the past   Heart murmur    History of echocardiogram    Echo 8/16: EF 55%, normal wall motion, grade 2 diastolic dysfunction, trivial MR, normal RV function, PASP 27 mmHg   Hypertension    IBS (irritable bowel syndrome)    Myocardial infarction (HCC) 06/2014   NSTEMI (non-ST elevated myocardial  infarction) (HCC) 08/02/2015   Sinus drainage    Type II diabetes mellitus (HCC)    Unstable angina (HCC) 01/05/2019    Social History   Socioeconomic History   Marital status: Married    Spouse name: Not on file   Number of children: Not on file   Years of education: Not on file   Highest education level: Not on file  Occupational History   Not on file  Tobacco Use   Smoking status: Never   Smokeless tobacco: Never  Vaping Use   Vaping status: Never Used  Substance and Sexual Activity   Alcohol use: No    Drug use: No   Sexual activity: Not on file  Other Topics Concern   Not on file  Social History Narrative   Not on file   Social Drivers of Health   Financial Resource Strain: Low Risk  (01/10/2023)   Overall Financial Resource Strain (CARDIA)    Difficulty of Paying Living Expenses: Not very hard  Food Insecurity: No Food Insecurity (01/10/2023)   Hunger Vital Sign    Worried About Running Out of Food in the Last Year: Never true    Ran Out of Food in the Last Year: Never true  Transportation Needs: No Transportation Needs (01/10/2023)   PRAPARE - Administrator, Civil Service (Medical): No    Lack of Transportation (Non-Medical): No  Physical Activity: Sufficiently Active (01/10/2023)   Exercise Vital Sign    Days of Exercise per Week: 3 days    Minutes of Exercise per Session: 150+ min  Stress: No Stress Concern Present (01/10/2023)   [employer] of Occupational Health - Occupational Stress Questionnaire    Feeling of Stress : Not at all  Social Connections: Socially Integrated (01/10/2023)   Social Connection and Isolation Panel [NHANES]    Frequency of Communication with Friends and Family: More than three times a week    Frequency of Social Gatherings with Friends and Family: More than three times a week    Attends Religious Services: More than 4 times per year    Active Member of Clubs or Organizations: Yes    Attends Banker Meetings: More than 4 times per year    Marital Status: Married  Catering manager Violence: Not At Risk (01/10/2023)   Humiliation, Afraid, Rape, and Kick questionnaire    Fear of Current or Ex-Partner: No    Emotionally Abused: No    Physically Abused: No    Sexually Abused: No    Past Surgical History:  Procedure Laterality Date   APPENDECTOMY     CARDIAC CATHETERIZATION N/A 05/12/2014   Procedure: Left Heart Cath and Coronary Angiography;  Surgeon: Lennette Bihari, MD;  Location: [facility];  Service: Cardiovascular;   Laterality: N/A;   CARDIAC CATHETERIZATION N/A 05/13/2014   Procedure: Coronary/Graft Atherectomy;  Surgeon: Lennette Bihari, MD;  Location: [facility];  Service: Cardiovascular;  Laterality: N/A;   CARDIAC CATHETERIZATION Right 05/13/2014   Procedure: Temporary Pacemaker;  Surgeon: Lennette Bihari, MD;  Location: [facility];  Service: Cardiovascular;  Laterality: Right;   CARDIAC CATHETERIZATION N/A 05/13/2014   Procedure: Coronary Stent Intervention;  Surgeon: Lennette Bihari, MD;  Location: [facility];  Service: Cardiovascular;  Laterality: N/A;   CARDIAC CATHETERIZATION N/A 06/18/2014   Procedure: Left Heart Cath and Coronary Angiography;  Surgeon: Peter M Swaziland, MD;  Location[facility] INVASIVE CV LAB;  Service: Cardiovascular;  Laterality:  N/A;   CARDIAC CATHETERIZATION N/A 08/03/2015   Procedure: Left Heart Cath and Cors/Grafts Angiography;  Surgeon: Yvonne Kendall, MD;  Location: [facility] INVASIVE CV LAB;  Service: Cardiovascular;  Laterality: N/A;   CARDIOVASCULAR STRESS TEST  05/06/2014   CARDIOVERSION N/A 02/21/2022   Procedure: CARDIOVERSION;  Surgeon: Meriam Sprague, MD;  Location: [facility] ENDOSCOPY;  Service: Cardiovascular;  Laterality: N/A;   CATARACT EXTRACTION W/ INTRAOCULAR LENS IMPLANT Left    COLONOSCOPY WITH PROPOFOL N/A 02/13/2020   Procedure: COLONOSCOPY WITH PROPOFOL;  Surgeon: Jeani Hawking, MD;  Location: [facility] ENDOSCOPY;  Service: Endoscopy;  Laterality: N/A;   CORONARY ARTERY BYPASS GRAFT N/A 06/22/2014   Procedure: CORONARY ARTERY BYPASS GRAFTING (CABG) x five, using left internal mammary artery, and right leg greater saphenous vein harvested endoscopically;  Surgeon: Loreli Slot, MD;  Location: [facility] OR;  Service: Open Heart Surgery;  Laterality: N/A;   ICD IMPLANT N/A 01/07/2019   Procedure: ICD IMPLANT;  Surgeon: Hillis Range, MD;  Location: [facility];  Service: Cardiovascular;  Laterality: N/A;   LAPAROSCOPIC CHOLECYSTECTOMY     LEFT HEART CATH  AND CORS/GRAFTS ANGIOGRAPHY N/A 01/06/2019   Procedure: LEFT HEART CATH AND CORS/GRAFTS ANGIOGRAPHY;  Surgeon: Yvonne Kendall, MD;  Location: [facility];  Service: Cardiovascular;  Laterality: N/A;   MAZE N/A 06/22/2014   Procedure: MAZE;  Surgeon: Loreli Slot, MD;  Location: [facility] OR;  Service: Open Heart Surgery;  Laterality: N/A;   POLYPECTOMY  02/13/2020   Procedure: POLYPECTOMY;  Surgeon: Jeani Hawking, MD;  Location: [facility] ENDOSCOPY;  Service: Endoscopy;;   TEE WITHOUT CARDIOVERSION N/A 06/22/2014   Procedure: TRANSESOPHAGEAL ECHOCARDIOGRAM (TEE);  Surgeon: Loreli Slot, MD;  Location: [facility] OR;  Service: Open Heart Surgery;  Laterality: N/A;    Family History  Problem Relation Age of Onset   Alzheimer's disease Mother    Emphysema Father    Cancer Brother    Arrhythmia Sister    Heart attack Sister    Heart disease Sister    Hyperlipidemia Sister    Hypertension Sister     Allergies  Allergen Reactions   Lipitor [Atorvastatin] Other (See Comments)    Myopathy Spring 2006   Pravastatin Other (See Comments)    LEG CRAMPS   Simvastatin Other (See Comments)    LEG CRAMPS    Current Outpatient Medications on File Prior to Visit  Medication Sig Dispense Refill   albuterol (VENTOLIN HFA) 108 (90 Base) MCG/ACT inhaler Inhale 2 puffs into the lungs every 6 (six) hours as needed for wheezing or shortness of breath. 8 g 3   carvedilol (COREG) 6.25 MG tablet TAKE 1 TABLET BY MOUTH TWICE A DAY WITH FOOD 180 tablet 1   Continuous Glucose Sensor (FREESTYLE LIBRE 3 SENSOR) MISC PLACE 1 SENSOR ON THE SKIN EVERY 14 DAYS. USE TO CHECK GLUCOSE CONTINUOUSLY 6 each 1   dapagliflozin propanediol (FARXIGA) 10 MG TABS tablet Take 1 tablet (10 mg total) by mouth daily before breakfast. For diabetes. 90 tablet 1   ELIQUIS 5 MG TABS tablet TAKE 1 TABLET BY MOUTH TWICE A DAY 60 tablet 5   furosemide (LASIX) 20 MG tablet TAKE 1 TABLET BY MOUTH DAILY AS NEEDED FOR WEIGHT GAIN OF 3 LBS IN  1 DAY OR 5 LBS IN 1 WEEK 90 tablet 1   insulin aspart (NOVOLOG FLEXPEN) 100 UNIT/ML FlexPen Inject 20 Units into the skin 3 (three) times daily with meals.     insulin degludec (TRESIBA FLEXTOUCH) 200 UNIT/ML  FlexTouch Pen Inject 100 Units into the skin daily. Via Novo Cares PAP 36 mL 0   Insulin Pen Needle 31G X 8 MM MISC Use as directed with Lantus. 100 each 5   isosorbide mononitrate (IMDUR) 30 MG 24 hr tablet TAKE 1 TABLET BY MOUTH EVERY DAY 90 tablet 2   nitroGLYCERIN (NITROSTAT) 0.4 MG SL tablet Place 1 tablet (0.4 mg total) under the tongue every 5 (five) minutes as needed for chest pain. 25 tablet 6   ondansetron (ZOFRAN-ODT) 4 MG disintegrating tablet Take 1 tablet (4 mg total) by mouth every 8 (eight) hours as needed for nausea or vomiting. 15 tablet 0   OVER THE COUNTER MEDICATION Place 1 drop into both eyes 2 (two) times daily. Happy body  liquid msm eye drops/ for floaters in the eyes     sacubitril-valsartan (ENTRESTO) 24-26 MG TAKE 1 TABLET BY MOUTH TWICE A DAY 180 tablet 3   Semaglutide, 1 MG/DOSE, 4 MG/3ML SOPN Inject 1 mg as directed once a week. for diabetes.     tamsulosin (FLOMAX) 0.4 MG CAPS capsule TAKE 1 CAPSULE (0.4 MG TOTAL) BY MOUTH DAILY. FOR URINE FLOW. 90 capsule 3   No current facility-administered medications on file prior to visit.    BP 104/60   Pulse 68   Temp 98.6 F (37 C) (Temporal)   Ht 5\' 8"  (1.727 m)   Wt 206 lb (93.4 kg)   SpO2 99%   BMI 31.32 kg/m  Objective:   Physical Exam HENT:     Right Ear: Tympanic membrane and ear canal normal.     Left Ear: Tympanic membrane and ear canal normal.  Eyes:     Pupils: Pupils are equal, round, and reactive to light.  Cardiovascular:     Rate and Rhythm: Normal rate and regular rhythm.  Pulmonary:     Effort: Pulmonary effort is normal.     Breath sounds: Normal breath sounds.  Abdominal:     General: Bowel sounds are normal.     Palpations: Abdomen is soft.     Tenderness: There is no abdominal  tenderness.  Musculoskeletal:        General: Normal range of motion.     Cervical back: Neck supple.  Skin:    General: Skin is warm and dry.  Neurological:     Mental Status: He is alert and oriented to person, place, and time.     Cranial Nerves: No cranial nerve deficit.     Deep Tendon Reflexes:     Reflex Scores:      Patellar reflexes are 2+ on the right side and 2+ on the left side. Psychiatric:        Mood and Affect: Mood normal.           Assessment & Plan:  Coronary artery disease of bypass graft of native heart with stable angina pectoris (HCC) Assessment & Plan: Stable.  Following with cardiology, office notes reviewed from February 2025 and March 2025. Continue carvedilol 6.25 mg twice daily, Imdur 30 mg daily, nitroglycerin as needed.  Also continue diabetes, lipid, blood pressure control.   HFrEF (heart failure with reduced ejection fraction) (HCC) Assessment & Plan: Appears euvolemic today.  Following with cardiology, office notes reviewed from February 2025 in March 2025.  Continue carvedilol 6.25 mg twice daily, Farxiga 10 mg daily, Imdur 30 mg daily, Entresto 24/26 mg twice daily, furosemide 20 mg as needed.   Primary hypertension Assessment & Plan: Controlled.  Continue Sherryll Burger  24/26 mg twice daily, carvedilol 6.25 mg twice daily.   Paroxysmal atrial fibrillation [facility]) Assessment & Plan: Following with cardiology, office notes reviewed from February 2025 and March 2025. Remote ICD review from April 2025 reviewed.  Continue carvedilol 6.25 mg twice daily, Eliquis 5 mg twice daily.   Type 2 diabetes mellitus with stage 2 chronic kidney disease, with long-term current use of insulin (HCC) Assessment & Plan: Uncontrolled based on Freestyle Libre 3+ report. Reviewed patients Freestyle APP as well.   Continue Tresiba 200 units/ml, 100 units daily. Continue Novolog 20 units TID with meals. Continue Ozempic 1 mg weekly, he could not  tolerate 2 mg dose. Continue Farxiga 10 mg daily.  Again, we extensively went over his diet and a proper diabetes diet. Again,he was offered a diabetes nutritionist referral for which he declines.    Will connect him with our pharmacy team.   Close follow up in 3 months.   Benign prostatic hyperplasia without lower urinary tract symptoms Assessment & Plan: Controlled.  Continue tamsulosin 0.4 mg daily.   Pure hypercholesterolemia Assessment & Plan: Statin and Zetia intolerant. Following with cardiology for PCSK9 inhibitor.   ICD (implantable cardioverter-defibrillator) in place Assessment & Plan: Reviewed recent remote read from April 2025.   Preventative health care Assessment & Plan: Prevnar 20 provided today. Colonoscopy UTD, due 2027 if clinically indicated. PSA screening discontinued given age.  He agrees.  Discussed the importance of a healthy diet and regular exercise in order for weight loss, and to reduce the risk of further co-morbidity.  Exam stable. Labs pending.  Follow up in 1 year for repeat physical.          Doreene Nest, NP

## 2023-04-10 NOTE — Assessment & Plan Note (Signed)
 Controlled.  Continue Entresto 24/26 mg twice daily, carvedilol 6.25 mg twice daily.

## 2023-04-10 NOTE — Assessment & Plan Note (Signed)
 Prevnar 20 provided today. Colonoscopy UTD, due 2027 if clinically indicated. PSA screening discontinued given age.  He agrees.  Discussed the importance of a healthy diet and regular exercise in order for weight loss, and to reduce the risk of further co-morbidity.  Exam stable. Labs pending.  Follow up in 1 year for repeat physical.

## 2023-04-12 ENCOUNTER — Telehealth: Payer: Self-pay

## 2023-04-12 NOTE — Progress Notes (Signed)
 Care Guide Pharmacy Note  04/12/2023 Name: [name] MRN: [id number] DOB: 1945-11-25  Referred By: Doreene Nest, NP Reason for referral: Complex Care Management (Outreach to schedule with pharm d )   [name] is a 78 y.o. year old male who is a primary care patient of Doreene Nest, NP.  [name] was referred to the pharmacist for assistance related to: DMII  Successful contact was made with the patient to discuss pharmacy services including being ready for the pharmacist to call at least 5 minutes before the scheduled appointment time and to have medication bottles and any blood pressure readings ready for review. The patient agreed to meet with the pharmacist via telephone visit on (date/time).04/19/2023  Penne Lash , RMA     [place]  [facility] Guide  Direct Dial: [phone]  Website: [email or web address]

## 2023-04-12 NOTE — Progress Notes (Unsigned)
 Patient ID: [name]                 DOB: 12-12-1945                    MRN: [id number]      HPI: [name] is a 78 y.o. male patient referred to lipid clinic by [name] Newcomer, PA-C. PMH is significant for SOB, CAD, HFrEF, status post CABG in June 2016 and non-STEMI in August 2017, statin intolerance, hypertension,PAF,BPH, hyperlipidemia, T2DM recent A1c 9.5 (04/10/2023)   CAD status post CABG in June 2016 and non-STEMI in August 2017. Last heart catheterization in January 2021 showed patent L-LAD, occluded vein graft to the ramus intermediate and D1, and patent vein graft to the RPDA and RPL with diffuse disease and 90% stenosis.   Patient presented today for lipid clinic. Reports he was not able to tolerate multiple statins in the past but last year he was put on rosuvastatin he believes it was 10 mg dose. And was able to tolerate ok but his lipid lab improved and his PCP had asked him to stop taking Crestor. He currently not on any cholesterol medications but willing to go back on Crestor 10 mg. His BG is out of control. Getting help from PCP for insulin dose adjustment  Reviewed options for lowering LDL cholesterol, including ezetimibe, PCSK-9 inhibitors, bempedoic acid and inclisiran.  Discussed mechanisms of action, dosing, side effects and potential decreases in LDL cholesterol.  Also reviewed cost information and potential options for patient assistance.  Current Medications: none  Intolerances: statins  Risk Factors: CAD, HFrEF, status post CABG in June 2016 and non-STEMI in August 2017, statin intolerance, hypertension, T2DM LDL goal: <55 mg/dl and TG <782 mg/dl  Last Lab 95/62/13: LDLc 127, HDL 42.80, TC 185 TG 79   Diet: diet is out of line, moving his place so eating out a lot  Exercise: play golf but uses gold cart most of the time, gets around 5 K steps in a day   Family History:  Relation Problem Comments  Mother (Deceased at age 78) Alzheimer's disease     Father  (Deceased at age 7) Emphysema     Sister (Alive) Arrhythmia   Heart attack at age 95    Heart disease   Hyperlipidemia   Hypertension     Brother (Deceased at age 40) Cancer     Social History:  Alcohol: none Smoking: none  Labs:  Lipid Panel     Component Value Date/Time   CHOL 185 04/10/2023 1210   CHOL 176 05/04/2021 0912   TRIG 79.0 04/10/2023 1210   HDL 42.40 04/10/2023 1210   HDL 44 05/04/2021 0912   CHOLHDL 4 04/10/2023 1210   VLDL 15.8 04/10/2023 1210   LDLCALC 127 (H) 04/10/2023 1210   LDLCALC 125 (H) 05/04/2021 0912   LABVLDL 7 05/04/2021 0912    Past Medical History:  Diagnosis Date   Arthritis    "fingers, shoulders" (08/03/2015)   Atrial fibrillation (HCC)    Paroxysmal, rare episodes, sinus rhythm on flecainide, patient prefers not to take Coumadin   Bradycardia    April, 2013   Chest pain    Nuclear, 2006, no ischemia   Chronic lower back pain    "all my life"   Coronary artery disease    s/p staged cath 05/12/2014 and 5/11, DES x 2 to heavily calcified RCA, residual with 60% prox LAD, 70% D1   Ejection fraction  EF 55-60%,  echo, January, 2011   Elevated CPK    CPK elevated with normal MB and normal troponin the past   Heart murmur    History of echocardiogram    Echo 8/16: EF 55%, normal wall motion, grade 2 diastolic dysfunction, trivial MR, normal RV function, PASP 27 mmHg   Hypertension    IBS (irritable bowel syndrome)    Myocardial infarction (HCC) 06/2014   NSTEMI (non-ST elevated myocardial infarction) (HCC) 08/02/2015   Sinus drainage    Type II diabetes mellitus (HCC)    Unstable angina (HCC) 01/05/2019    Current Outpatient Medications on File Prior to Visit  Medication Sig Dispense Refill   albuterol (VENTOLIN HFA) 108 (90 Base) MCG/ACT inhaler Inhale 2 puffs into the lungs every 6 (six) hours as needed for wheezing or shortness of breath. 8 g 3   carvedilol (COREG) 6.25 MG tablet TAKE 1 TABLET BY MOUTH TWICE A DAY WITH FOOD  180 tablet 1   Continuous Glucose Sensor (FREESTYLE LIBRE 3 SENSOR) MISC PLACE 1 SENSOR ON THE SKIN EVERY 14 DAYS. USE TO CHECK GLUCOSE CONTINUOUSLY 6 each 1   dapagliflozin propanediol (FARXIGA) 10 MG TABS tablet Take 1 tablet (10 mg total) by mouth daily before breakfast. For diabetes. 90 tablet 1   ELIQUIS 5 MG TABS tablet TAKE 1 TABLET BY MOUTH TWICE A DAY 60 tablet 5   furosemide (LASIX) 20 MG tablet TAKE 1 TABLET BY MOUTH DAILY AS NEEDED FOR WEIGHT GAIN OF 3 LBS IN 1 DAY OR 5 LBS IN 1 WEEK 90 tablet 1   insulin aspart (NOVOLOG FLEXPEN) 100 UNIT/ML FlexPen Inject 20 Units into the skin 3 (three) times daily with meals.     insulin degludec (TRESIBA FLEXTOUCH) 200 UNIT/ML FlexTouch Pen Inject 100 Units into the skin daily. Via Novo Cares PAP 36 mL 0   Insulin Pen Needle 31G X 8 MM MISC Use as directed with Lantus. 100 each 5   isosorbide mononitrate (IMDUR) 30 MG 24 hr tablet TAKE 1 TABLET BY MOUTH EVERY DAY 90 tablet 2   nitroGLYCERIN (NITROSTAT) 0.4 MG SL tablet Place 1 tablet (0.4 mg total) under the tongue every 5 (five) minutes as needed for chest pain. 25 tablet 6   ondansetron (ZOFRAN-ODT) 4 MG disintegrating tablet Take 1 tablet (4 mg total) by mouth every 8 (eight) hours as needed for nausea or vomiting. 15 tablet 0   OVER THE COUNTER MEDICATION Place 1 drop into both eyes 2 (two) times daily. Happy body  liquid msm eye drops/ for floaters in the eyes     sacubitril-valsartan (ENTRESTO) 24-26 MG TAKE 1 TABLET BY MOUTH TWICE A DAY 180 tablet 3   Semaglutide, 1 MG/DOSE, 4 MG/3ML SOPN Inject 1 mg as directed once a week. for diabetes.     tamsulosin (FLOMAX) 0.4 MG CAPS capsule TAKE 1 CAPSULE (0.4 MG TOTAL) BY MOUTH DAILY. FOR URINE FLOW. 90 capsule 3   No current facility-administered medications on file prior to visit.    Allergies  Allergen Reactions   Lipitor [Atorvastatin] Other (See Comments)    Myopathy Spring 2006   Pravastatin Other (See Comments)    LEG CRAMPS    Simvastatin Other (See Comments)    LEG CRAMPS    Assessment/Plan:  1. Hyperlipidemia -  Problem  Hyperlipidemia   In the past the patient had significant symptoms from Lipitor.    Hyperlipidemia Assessment:  LDL goal: < 55 mg/dl TG <191 mg/dl  last LDLc  127 mg/dl TG 79 mg/dl off of Crestor  Intolerance to multiple statins but tolerated Crestor 10 mg daily well, last lab before procedure was good so pt had stopped taking Crestor  Discussed next potential options (ezetimibe, PCSK-9 inhibitors, bempedoic acid and inclisiran); cost, dosing efficacy, side effects  Diet needs improvement and need to follow regular exercise regimen   Plan: Start taking Crestor 10 gm daily  Will apply for PA for PCSK9i; will inform patient upon approval Lipid lab due in 2-3 months of therapy optimization   Thank you,  Carmela Hurt, Pharm.D [facility] [address] [place], [place] [address]  Phone: [phone]; Fax: [phone]

## 2023-04-13 ENCOUNTER — Ambulatory Visit: Payer: Medicare Other | Attending: Cardiology | Admitting: Pharmacist

## 2023-04-13 ENCOUNTER — Telehealth: Payer: Self-pay

## 2023-04-13 ENCOUNTER — Other Ambulatory Visit (HOSPITAL_COMMUNITY): Payer: Self-pay

## 2023-04-13 ENCOUNTER — Telehealth: Payer: Self-pay | Admitting: Pharmacist

## 2023-04-13 ENCOUNTER — Other Ambulatory Visit: Payer: Self-pay | Admitting: Primary Care

## 2023-04-13 ENCOUNTER — Encounter: Payer: Self-pay | Admitting: Pharmacist

## 2023-04-13 DIAGNOSIS — E1122 Type 2 diabetes mellitus with diabetic chronic kidney disease: Secondary | ICD-10-CM

## 2023-04-13 DIAGNOSIS — E7849 Other hyperlipidemia: Secondary | ICD-10-CM | POA: Diagnosis not present

## 2023-04-13 MED ORDER — REPATHA SURECLICK 140 MG/ML ~~LOC~~ SOAJ: 140.0000 mg | SUBCUTANEOUS | 3 refills | Status: DC

## 2023-04-13 MED ORDER — ROSUVASTATIN CALCIUM 10 MG PO TABS: 10.0000 mg | ORAL_TABLET | Freq: Every day | ORAL | 3 refills | Status: DC

## 2023-04-13 MED ORDER — ROSUVASTATIN CALCIUM 10 MG PO TABS
10.0000 mg | ORAL_TABLET | Freq: Every day | ORAL | 3 refills | Status: DC
  Filled 2023-04-13: qty 90, 90d supply, fill #0

## 2023-04-13 NOTE — Telephone Encounter (Signed)
 Pharmacy Patient Advocate Encounter  Received notification from [facility] that Prior Authorization for REPATHA has been APPROVED from 04/13/23 to 10/13/23. Ran test claim, Copay is $47. This test claim was processed through [facility]- copay amounts may vary at other pharmacies due to pharmacy/plan contracts, or as the patient moves through the different stages of their insurance plan.

## 2023-04-13 NOTE — Assessment & Plan Note (Signed)
 Assessment:  LDL goal: < 55 mg/dl TG <846 mg/dl  last LDLc  962 mg/dl TG 79 mg/dl off of Crestor  Intolerance to multiple statins but tolerated Crestor 10 mg daily well, last lab before procedure was good so pt had stopped taking Crestor  Discussed next potential options (ezetimibe, PCSK-9 inhibitors, bempedoic acid and inclisiran); cost, dosing efficacy, side effects  Diet needs improvement and need to follow regular exercise regimen   Plan: Start taking Crestor 10 gm daily  Will apply for PA for PCSK9i; will inform patient upon approval Lipid lab due in 2-3 months of therapy optimization

## 2023-04-13 NOTE — Patient Instructions (Signed)
 Your Results:             Your most recent labs Goal  Total Cholesterol 185 < 200  Triglycerides 79 < 150  HDL (happy/good cholesterol) 42.80 > 40  LDL (lousy/bad cholesterol 127 < 55   Medication changes: start taking Crestor 10 mg daily and we will start the process to get Repatha or Praluent  covered by your insurance.  Once the prior authorization is complete, we will call you to let you know and confirm pharmacy information.    Praluent is a cholesterol medication that improved your body's ability to get rid of "bad cholesterol" known as LDL. It can lower your LDL up to 60%. It is an injection that is given under the skin every 2 weeks. The most common side effects of Praluent include runny nose, symptoms of the common cold, rarely flu or flu-like symptoms, back/muscle pain in about 3-4% of the patients, and redness, pain, or bruising at the injection site.    Repatha is a cholesterol medication that improved your body's ability to get rid of "bad cholesterol" known as LDL. It can lower your LDL up to 60%! It is an injection that is given under the skin every 2 weeks. The medication often requires a prior authorization from your insurance company. The most common side effects of Repatha include runny nose, symptoms of the common cold, rarely flu or flu-like symptoms, back/muscle pain in about 3-4% of the patients, and redness, pain, or bruising at the injection site.   Lab orders: We want to repeat labs after 2-3 months.  We will send you a lab order to remind you once we get closer to that time.

## 2023-04-13 NOTE — Addendum Note (Signed)
 Addended by: Tylene Fantasia on: 04/13/2023 04:23 PM   Modules accepted: Orders

## 2023-04-13 NOTE — Telephone Encounter (Signed)
 Call to inform about PA approval, N/A VM is full can not leave msg. Mychart sent

## 2023-04-13 NOTE — Telephone Encounter (Signed)
 Pharmacy Patient Advocate Encounter   Received notification from Physician's Office that prior authorization for REPATHA is required/requested.   Insurance verification completed.   The patient is insured through [facility] .   Per test claim: PA required; PA submitted to above mentioned insurance via CoverMyMeds Key/confirmation #/[facility] Status is pending

## 2023-04-13 NOTE — Telephone Encounter (Signed)
 PA request has been Submitted. New Encounter has been or will be created for follow up. For additional info see Pharmacy Prior Auth telephone encounter from 04/13/23.

## 2023-04-19 ENCOUNTER — Other Ambulatory Visit (INDEPENDENT_AMBULATORY_CARE_PROVIDER_SITE_OTHER): Admitting: Pharmacist

## 2023-04-19 DIAGNOSIS — E119 Type 2 diabetes mellitus without complications: Secondary | ICD-10-CM

## 2023-04-19 NOTE — Progress Notes (Signed)
 04/20/2023 Name: [name] MRN: [id number] DOB: [birth year]  Subjective  Chief Complaint  Patient presents with   Diabetes    Reason for visit: ?  [name] is a 78 y.o. male with a history of diabetes (type 2), who presents today for an initial diabetes/CKD pharmacotherapy visit.? Pertinent PMH also includes PAF w/ICD, HTN, CAD s/p CABG, ischemic cardiomyopathy, HFrEF, NASH, BPH, HLD.  Known DM Complications: retinopathy, NASH  Care Team: Primary Care Provider: Gabriel John, NP   Date of Last Diabetes Related Visit: with PCP on 04/10/23   Recent Summary of Change: Dietary indiscretions resulting in difficult-to-control BG. Fluctuations range form 60 to >300 mg/dL.   Medication Access/Adherence: Prescription drug coverage: Payor: [employer] MEDICARE / Plan: UHC MEDICARE / Product Type: *No Product type* / .  - Reports that all medications are not affordable. $47/mo foEntresto , insulins, eliquis  - Current Patient Assistance: None (was enrolled for NovoNordisk, AZ&Me in 2024) - Medication Adherence: Patient denies missing doses of their medication.    Since Last visit / History of Present Illness: ?  Patient reports doing well since last visit. Denies adverse effects with current regimen. Continues to note random lows a couple days per week.   Reported DM Regimen: ?  Farxiga  10 mg daily Novolog  20 units TIDAC Does adjust dose periodically - ex. 26 units if BG is 250+ prior to meal Tresiba  U200 100 units daily (does not self-adjust) Ozempic  1.0 mg weekly    DM medications tried in the past:?  Metformin  ER 500 mg once daily (cramping) Glipizide  XL 10 mg daily (d/c 04/2021) Ozempic  2.0 mg (upset stomach)  SMBG: Libre 3+ (connected to LibreView)?       Hypo/Hyperglycemia: ?  Symptoms of hypoglycemia since last visit:? yes ~twice/wk on average. High50s-60s  Reported Diet: Patient typically eats 3 meals per day. Does not skip meals Breakfast: Sausage  biscuits, eggs, bacon Lunch: Meat, veggie, starch, sometimes skips Dinner: Pizza, meat, potatoes, pasta, rice, veggie Snacks: Candy bars when glucose drops, potato chips Beverages: Water , diet coke  DM Prevention:  Statin: Taking; moderate intensity + PCSK9i History of chronic kidney disease? no History of albuminuria?  Borderline <30 , last UACR on 12/22/21 = 29 mg/g - DUE ACE/ARB - Taking Entresto ; Urine MA/CR Ratio -  slightly elevated Last eye exam: 2021; Retinopathy Present - DUE Last foot exam: 12/22/2021 - DUE Tobacco Use: Never smoker  Immunizations:? Flu: Up to date (Last: 09/18/2022); Pneumococcal: Complete (PPSV23 (2007, 2018) PCV20 (2025; received after age 19) PCV13 (2016) ); Shingrix:  Complete  (Last: 12/18/2018, 09/21/2018)  Cardiovascular Risk Reduction History of clinical ASCVD? yes (s/p CABG) History of heart failure? yes. Is on beta blocker, ARNI, SGLT2i. No MRA.  History of hyperlipidemia? yes Current BMI: 31.3 kg/m2 (Ht 68 in, Wt 93.4 kg) Taking statin? yes; moderate intensity (rosuvastatin  10 mg) Taking aspirin ? indicated (secondary prevention); Not taking followed by cards. On DOAC for PAF.  Taking SGLT-2i? yes Taking GLP- 1 RA? yes   HTN / HFrEF / PAF Reported HTN/CHF Regimen: ?  Farxiga  10 mg daily Entresto  24/26 mg twice daily Carvedilol  6.25 mg twice daily Isosorbide  mononitrate 30 mg daily   PAF Stroke Prevention: ? CHA?DS?-VASc = 5 Eliquis  5 mg twice daily (last filled x30 days in February)  HLD Reported HLD Regimen: ?  Repatha  (prescribed 04/13/23. Has not started) Rosuvastatin  10 mg daily (prescribed 04/13/23. Starting taking a few days ago) Antiplatelet: Not taking   Previous medication tried  Ezetimibe  10 mg daily (07/2020-04/2021) Statins (myalgia - simvastatin , pravastatin , atorvastatin )     _______________________________________________  Objective    Review of Systems:? Limited in the setting of virtual visit  GI:? No nausea,  vomiting, constipation, diarrhea, abdominal pain, dyspepsia, change in bowel habits  Endocrine:? No polyuria, polyphagia or blurred vision     Physical Examination:  Vitals:  Wt Readings from Last 3 Encounters:  04/10/23 206 lb (93.4 kg)  03/27/23 204 lb 12.8 oz (92.9 kg)  02/28/23 208 lb 3.2 oz (94.4 kg)   BP Readings from Last 3 Encounters:  04/10/23 104/60  03/27/23 (!) 142/80  02/28/23 128/60   Pulse Readings from Last 3 Encounters:  04/10/23 68  03/27/23 77  02/28/23 79     Labs:?  Lab Results  Component Value Date   HGBA1C 9.5 (H) 04/10/2023   HGBA1C 7.7 (A) 09/18/2022   HGBA1C 8.1 (A) 06/29/2022   GLUCOSE 291 (H) 02/28/2023   MICRALBCREAT 2.8 12/22/2021   MICRALBCREAT 1.3 11/04/2015   CREATININE 1.21 02/28/2023   CREATININE 1.13 01/31/2022   CREATININE 1.29 11/30/2021   GFR 53.87 (L) 11/30/2021   GFR 75.44 12/07/2020   GFR 59.59 (L) 11/26/2019    Lab Results  Component Value Date   CHOL 185 04/10/2023   LDLCALC 127 (H) 04/10/2023   LDLCALC 125 (H) 05/04/2021   LDLCALC 75 12/07/2020   HDL 42.40 04/10/2023   TRIG 79.0 04/10/2023   TRIG 35 05/04/2021   TRIG 60.0 12/07/2020   ALT 24 02/28/2023   ALT 32 03/30/2022   AST 22 02/28/2023   AST 22 03/30/2022      Chemistry      Component Value Date/Time   NA 141 02/28/2023 0942   K 4.8 02/28/2023 0942   CL 102 02/28/2023 0942   CO2 20 02/28/2023 0942   BUN 16 02/28/2023 0942   CREATININE 1.21 02/28/2023 0942   CREATININE 1.00 08/12/2015 0934      Component Value Date/Time   CALCIUM  9.7 02/28/2023 0942   ALKPHOS 83 02/28/2023 0942   AST 22 02/28/2023 0942   ALT 24 02/28/2023 0942   BILITOT 0.4 02/28/2023 0942      Assessment and Plan:   1. Diabetes, type 2: uncontrolled. A1c 9.5% (04/10/23), increased from 7.7% (09/2022). Goal <7% without hypoglycemia. On Libre report, BG remains highly variable related to dietary choices. Some days consistently >200, others WNL w hypoglcyemia. Hypoglycemia 2-3  times/week typically in afternoon. Does admit to using higher Novolog  doses if sugars are >250 before eating. Reviewed possibility of doing this in a more controlled way (e.g adding correction factor) which he is open to - will defer to next week after his move.   Current Regimen: Tresiba  U200 100u/d, Novolog  20u TIDAC (sometimes 26u), Ozempic  1.0, Farxiga  10 mg/d  HCM: Due for eye exam, foot exam, and UACR Continue medications today without changes.  REENROLLMENT initiated: Novo (Ozempic , Tresiba  U200, Novolog ), AZ (Farxiga ) Reviewed s/sx/tx hypoglycemia  Future Consideration: Novolog : Patient open to a correctional scale. Discussed with patient and he seemed to grasp the concept well. Defer to next phone check in (currently actively moving homes) Correction factor (CF): 1800/TDD = ??11.25 mg/dL per 1 unit of insulin      Pre-meal fasting goal 70-130 (will target 130-140)     Base: 20 units    If your blood sugar before a meal is:  ?70-159:   Base 20 unit + 0 additional units   160-179: Base 20 unit + 2 additional units =  22 units ?180-199: Base 20 unit + 4 additional units = 24 units ?200+: Base 20 unit + 6 additional units = 26 units   GLP1-RA: Did not tolerate ozempic  2.0 previously. Reasonable to reconsider tohuogh would not recommend as long as not working on diet (expct eating out/pizza to increase adverse effects). Metformin : Notes GI side effects/cramping on XR 500 mg/d.   SU: Not unreasonable, though defer given alternative options with lower risk of hypoglycemia.  TZD: Avoiding due to possible weight gain/increase in fracture risk and CHF dx.    2. HTN: controlled based on last clinic BP of 104/60 mmHg (04/10/23), goal <130/80 mmHg. Denies lightheadedness, dizziness, SOB, CP, vision changes.  Continue medications without changes, follows with cardiology.  ENROLLED in cardiomyopathy [employer] to cover the cost of: Entresto , +/- Farxiga    3. ASCVD (secondary  prevention): LDL unchanged on last lipid panel with LDL 127 mg/dL (01/08/08), prior 960 (04/07/38). TG remain WNL 79 mg/dL. LDL goal <55 mg/dL (secondary prevention). Adherent to PCSK9i. Recent started on Repatha  and rosuvastatin  10 mg by cardiology (1st fill 04/13/23). Key risk factors include: diabetes, hypertension, hyperlipidemia, BMI >30 kg/m2, and Known CAD (s/p CABG) Current Regimen: Repatha  (not taking), rosuvastatin  10 mg daily Continue medications today without changes ENROLLED in cholesterol [employer] Grant to cover the cost of: Repatha    4. Healthcare Maintenance:  Pneumococcal - Current status: Series complete (PCV20 2025 age >47)  Shingles - Current status: Series complete Influenza - Current status: Up to date Due to receive the following vaccines: Tdap (last 2013)   Follow Up PCP follow up scheduled ~3.5 months Follow up with clinical pharmacist via phone in ~1 week to confirm Novolog  CF Scale Patient given direct line for questions regarding medication therapy and MAP applications  Future Appointments  Date Time Provider Department Center  04/25/2023  3:35 PM Gabino Joe, PA-C [facility]  05/23/2023  7:20 AM [facility] PET CT 1 [facility] [place]  07/09/2023  7:45 AM [facility] DEVICE REMOTES [facility]  08/08/2023 11:00 AM Gabriel John, NP [facility]  10/08/2023  7:45 AM [facility] DEVICE REMOTES [facility]  01/07/2024  7:45 AM [facility] DEVICE REMOTES [facility]  01/14/2024  3:40 PM [facility] ANNUAL WELLNESS VISIT 1 [facility]  04/07/2024  7:45 AM [facility] DEVICE REMOTES [facility]    Daron Ellen, PharmD Clinical Pharmacist [facility] [phone]

## 2023-04-20 ENCOUNTER — Encounter: Payer: Self-pay | Admitting: Pharmacist

## 2023-04-20 NOTE — Progress Notes (Signed)
 Documentation Reason for Call: [employer] Grant Approval  Summary of Call: [facility] pharmacy and provided them with [facility] information for Repatha . They confirm successful claim for $0/month  Attempted to also provide cardiomyopathy grant though they state since BIN and PCN are the same for both cards, their systems cannot save both.  They note patient will have to bring in card numbers for each refill..   Follow Up: Patient given direct line for further questions/concerns.  Daron Ellen, PharmD Clinical Pharmacist [facility] [phone]

## 2023-04-20 NOTE — Progress Notes (Unsigned)
 [employer] - Re-enrollment 2025   Medication(s): All cholesterol medications (prescribed Repatha )   Application Status:  Approved for initial enrollment    [employer]: ID [id number] Fund: Hypercholesterolemia - Medicare Access Assistance Type: Co-pay Start Date: 03/20/2023 End Date: 03/18/2024               Rx Card: Card No.  [id number] RX BIN:  [id number] PCN:  [id number] Group:  [id number]   Fund: Cariomyopathy - Medicare Access Assistance Type: Co-pay Start Date: 03/20/2023 End Date: 03/18/2024               Rx Card: Card No.  [id number] RX BIN:  [id number] PCN:  [id number] Group:  [id number]   Manuelita FABIENE Kobs, PharmD Clinical Pharmacist [facility] [phone]

## 2023-04-25 ENCOUNTER — Ambulatory Visit: Payer: Medicare Other | Attending: Physician Assistant | Admitting: Physician Assistant

## 2023-04-25 NOTE — Progress Notes (Deleted)
 {This patient may be at risk for Amyloid. He has one or more dx on the problem list or PMH from the following list - Abnormal EKG, CHF, Aortic Stenosis, Proteinuria, LVH, Carpal Tunnel Syndrome, Biceps Tendon Rupture, Syncope. See list below or review PMH.  Diagnoses From Problem List           Noted     HFrEF (heart failure with reduced ejection fraction) (HCC) 02/27/2023     Syncope 04/11/2019    Click HERE to open Cardiac Amyloid Screening SmartSet to order screening OR Click HERE to defer testing for 1 year or permanently :1}    Cardiology Office Note:    Date:  04/25/2023  ID:  [name], DOB 06/15/1945, MRN [id number] PCP: Gabriel John, NP  [facility] Providers Cardiologist:  Dorothye Gathers, MD Cardiology APP:  Gabino Joe, PA-C  Electrophysiologist:  Boyce Byes, MD { Click to update primary MD,subspecialty MD or APP then REFRESH:1}    {Click to Open Review  :1}   Patient Profile:     *** Coronary artery disease  S/p CABG + Maze procedure + LAA clipping 06/2014 NSTEMI 08/2015 - LHC w S-RCA 100 >> med Rx LHC 01/06/19: L-LAD patent, S-RI/D1 100 CTO, S-RPDA/RPL patent and diffusely dz'd w 90 before S-RPDA anastomosis >> Med Rx (HFrEF) heart failure with reduced ejection fraction Ischemic CM TEE 06/22/14: EF 40-45 TTE 01/10/16: EF 50-55, inf AK TTE 02/13/22: EF 25-30, global HK, mod pulm HTN, RVSP 48.2, mild LAE, mild MR, mild to mod TR, asc aorta 40 mm, RAP 8 TTE 03/15/2023: EF 35-40, global HK, moderately reduced RVSF, moderate BAE, trivial MR, ascending aorta 38 mm, RAP 3 S/p AICD 01/2019  Paroxysmal atrial fibrillation  S/p DCCV in 02/2022 Dilated ascending aorta TTE 03/2023: 38 mm Hypertension  Hyperlipidemia   Intol of Zetia  (myalgias); hesitant to take PCSK9i Seen by PharmD 04/2023 >> Repatha  PA approved Diabetes mellitus  Carotid stenosis US  06/19/14: Bilat ICA 1-39          Discussed the use of AI scribe software for clinical note transcription  with the patient, who gave verbal consent to proceed.  History of Present Illness [name] is a 78 y.o. male who returns for follow up of CAD, CHF. He was last seen in 02/2023 for evaluation of shortness of breath. He noted a syncopal episode while playing golf 6 mos prior. He thought it was due to low glucose and did not seek medical attention. Hgb, NT-Pro BNP were normal. Interrogation of his device showed 0.1% AFib burden. His Optivol was increased during the period he was feeling shortness of breath. I started him on Lasix  as needed. TTE was obtained and demonstrated EF 35-40, global HK. PET MPI is pending 05/23/23.    ROS-See HPI***    Studies Reviewed:       *** Results Labs reviewed 02/28/23: K 4.8, SCr 1.21, ALT 24 04/10/23: TC 185, HDL 42.4, LDL 127, Trig 79   Risk Assessment/Calculations:   {Does this patient have ATRIAL FIBRILLATION?[phone]} No BP recorded.  {Refresh Note OR Click here to enter BP  :1}***       Physical Exam:   VS:  There were no vitals taken for this visit.   Wt Readings from Last 3 Encounters:  04/10/23 206 lb (93.4 kg)  03/27/23 204 lb 12.8 oz (92.9 kg)  02/28/23 208 lb 3.2 oz (94.4 kg)    Physical Exam***     Assessment and  Plan:   Assessment & Plan Coronary artery disease of bypass graft of native heart with stable angina pectoris (HCC)  HFrEF (heart failure with reduced ejection fraction) (HCC)  Paroxysmal atrial fibrillation (HCC)  Assessment and Plan Assessment & Plan    {      :1}    {Are you ordering a CV Procedure (e.g. stress test, cath, DCCV, TEE, etc)?   Press F2        :[id number]}  Dispo:  No follow-ups on file.  Signed, Marlyse Single, PA-C

## 2023-04-27 ENCOUNTER — Ambulatory Visit: Payer: Medicare Other | Admitting: Physician Assistant

## 2023-05-01 ENCOUNTER — Ambulatory Visit: Payer: Medicare Other | Admitting: Physician Assistant

## 2023-05-04 ENCOUNTER — Other Ambulatory Visit: Payer: Self-pay

## 2023-05-04 DIAGNOSIS — E1159 Type 2 diabetes mellitus with other circulatory complications: Secondary | ICD-10-CM

## 2023-05-04 MED ORDER — FREESTYLE LIBRE 3 SENSOR MISC: 1 refills | Status: DC

## 2023-05-14 NOTE — Addendum Note (Signed)
 Addended by: Lott Rouleau A on: 05/14/2023 11:14 AM   Modules accepted: Orders

## 2023-05-14 NOTE — Progress Notes (Signed)
 Remote ICD transmission.

## 2023-05-15 ENCOUNTER — Other Ambulatory Visit: Payer: Self-pay | Admitting: Primary Care

## 2023-05-15 DIAGNOSIS — E1159 Type 2 diabetes mellitus with other circulatory complications: Secondary | ICD-10-CM

## 2023-05-19 DIAGNOSIS — R0981 Nasal congestion: Secondary | ICD-10-CM | POA: Diagnosis not present

## 2023-05-19 DIAGNOSIS — R059 Cough, unspecified: Secondary | ICD-10-CM | POA: Diagnosis not present

## 2023-05-23 ENCOUNTER — Ambulatory Visit (HOSPITAL_COMMUNITY): Admission: RE | Admit: 2023-05-23 | Source: Ambulatory Visit

## 2023-05-24 ENCOUNTER — Other Ambulatory Visit: Payer: Self-pay | Admitting: Primary Care

## 2023-05-24 DIAGNOSIS — E1159 Type 2 diabetes mellitus with other circulatory complications: Secondary | ICD-10-CM

## 2023-05-25 ENCOUNTER — Telehealth (HOSPITAL_COMMUNITY): Payer: Self-pay | Admitting: *Deleted

## 2023-05-25 ENCOUNTER — Encounter (HOSPITAL_COMMUNITY): Payer: Self-pay

## 2023-05-25 NOTE — Telephone Encounter (Signed)
 Reaching out to patient to offer assistance regarding upcoming cardiac imaging study; pt verbalizes understanding of appt date/time, parking situation and where to check in, pre-test NPO status and verified current allergies; name and call back number provided for further questions should they arise  [name] Brick RN Navigator Cardiac Imaging [facility] [phone] office [phone] cell  Patient aware to avoid caffeine 12 hours prior to her cardiac PET scan.

## 2023-05-25 NOTE — Addendum Note (Signed)
 Addended byReyne Cave, Geralyn Knee T on: 05/25/2023 10:13 AM   Modules accepted: Orders

## 2023-05-29 ENCOUNTER — Telehealth: Payer: Self-pay | Admitting: Cardiology

## 2023-05-29 ENCOUNTER — Ambulatory Visit (HOSPITAL_COMMUNITY)
Admission: RE | Admit: 2023-05-29 | Discharge: 2023-05-29 | Disposition: A | Discharge status: Home / Self Care | Source: Ambulatory Visit | Attending: Physician Assistant | Admitting: Physician Assistant

## 2023-05-29 ENCOUNTER — Ambulatory Visit: Payer: Self-pay | Admitting: Physician Assistant

## 2023-05-29 DIAGNOSIS — R9389 Abnormal findings on diagnostic imaging of other specified body structures: Secondary | ICD-10-CM

## 2023-05-29 DIAGNOSIS — R0602 Shortness of breath: Secondary | ICD-10-CM

## 2023-05-29 DIAGNOSIS — I25708 Atherosclerosis of coronary artery bypass graft(s), unspecified, with other forms of angina pectoris: Secondary | ICD-10-CM

## 2023-05-29 DIAGNOSIS — I255 Ischemic cardiomyopathy: Secondary | ICD-10-CM

## 2023-05-29 DIAGNOSIS — I5022 Chronic systolic (congestive) heart failure: Secondary | ICD-10-CM

## 2023-05-29 DIAGNOSIS — R079 Chest pain, unspecified: Secondary | ICD-10-CM

## 2023-05-29 LAB — NM PET CT CARDIAC PERFUSION MULTI W/ABSOLUTE BLOODFLOW
LV dias vol: 177 mL (ref 62–150)
LV sys vol: 116 mL
MBFR: 1.52
Rest MBF: 0.66 ml/g/min
Rest Nuclear Isotope Dose: 24.1 mCi
ST Depression (mm): 0 mm
Stress MBF: 1 ml/g/min
Stress Nuclear Isotope Dose: 24.3 mCi

## 2023-05-29 MED ORDER — RUBIDIUM RB82 GENERATOR (RUBYFILL)
24.1400 | PACK | Freq: Once | INTRAVENOUS | Status: AC
  Administered 2023-05-29: 24.14 via INTRAVENOUS

## 2023-05-29 MED ORDER — REGADENOSON 0.4 MG/5ML IV SOLN
0.4000 mg | Freq: Once | INTRAVENOUS | Status: AC
  Administered 2023-05-29: 0.4 mg via INTRAVENOUS

## 2023-05-29 MED ORDER — RUBIDIUM RB82 GENERATOR (RUBYFILL)
24.3000 | PACK | Freq: Once | INTRAVENOUS | Status: AC
  Administered 2023-05-29: 24.3 via INTRAVENOUS

## 2023-05-29 MED ORDER — REGADENOSON 0.4 MG/5ML IV SOLN
INTRAVENOUS | Status: AC
  Filled 2023-05-29: qty 5

## 2023-05-29 NOTE — Telephone Encounter (Signed)
Calling with a call report. Please advise  

## 2023-05-29 NOTE — Telephone Encounter (Signed)
 IMPRESSION: 1. Bilateral lower lobe peribronchovascular nodularity and ground-glass, favoring infectious bronchiolitis or aspiration. Of indeterminate acuity, but favored to be acute/subacute. Consider dedicated diagnostic chest CT to serve as a baseline.    Imaging calling a critical report from patient's Cardiac PET  CT. Will send to order provider.

## 2023-05-29 NOTE — Telephone Encounter (Signed)
 See result note. Marlyse Single, PA-C    05/29/2023 4:41 PM

## 2023-05-30 DIAGNOSIS — R059 Cough, unspecified: Secondary | ICD-10-CM | POA: Diagnosis not present

## 2023-05-30 DIAGNOSIS — R0981 Nasal congestion: Secondary | ICD-10-CM | POA: Diagnosis not present

## 2023-06-06 ENCOUNTER — Other Ambulatory Visit: Payer: Self-pay

## 2023-06-06 MED ORDER — CARVEDILOL 6.25 MG PO TABS: 6.2500 mg | ORAL_TABLET | Freq: Two times a day (BID) | ORAL | 2 refills | Status: DC

## 2023-06-24 ENCOUNTER — Encounter (HOSPITAL_COMMUNITY): Payer: Self-pay

## 2023-06-24 ENCOUNTER — Emergency Department (HOSPITAL_COMMUNITY)
Admission: EM | Admit: 2023-06-24 | Discharge: 2023-06-24 | Disposition: A | Discharge status: Home / Self Care | Attending: Emergency Medicine | Admitting: Emergency Medicine

## 2023-06-24 ENCOUNTER — Other Ambulatory Visit: Payer: Self-pay

## 2023-06-24 ENCOUNTER — Emergency Department (HOSPITAL_COMMUNITY)

## 2023-06-24 DIAGNOSIS — Z794 Long term (current) use of insulin: Secondary | ICD-10-CM | POA: Insufficient documentation

## 2023-06-24 DIAGNOSIS — Z79899 Other long term (current) drug therapy: Secondary | ICD-10-CM | POA: Diagnosis not present

## 2023-06-24 DIAGNOSIS — Z7901 Long term (current) use of anticoagulants: Secondary | ICD-10-CM | POA: Insufficient documentation

## 2023-06-24 DIAGNOSIS — J40 Bronchitis, not specified as acute or chronic: Secondary | ICD-10-CM | POA: Diagnosis not present

## 2023-06-24 DIAGNOSIS — Z743 Need for continuous supervision: Secondary | ICD-10-CM | POA: Diagnosis not present

## 2023-06-24 DIAGNOSIS — R7989 Other specified abnormal findings of blood chemistry: Secondary | ICD-10-CM | POA: Insufficient documentation

## 2023-06-24 DIAGNOSIS — I2510 Atherosclerotic heart disease of native coronary artery without angina pectoris: Secondary | ICD-10-CM | POA: Insufficient documentation

## 2023-06-24 DIAGNOSIS — I700 Atherosclerosis of aorta: Secondary | ICD-10-CM | POA: Diagnosis not present

## 2023-06-24 DIAGNOSIS — E1165 Type 2 diabetes mellitus with hyperglycemia: Secondary | ICD-10-CM | POA: Diagnosis not present

## 2023-06-24 DIAGNOSIS — R0602 Shortness of breath: Secondary | ICD-10-CM | POA: Diagnosis not present

## 2023-06-24 DIAGNOSIS — I10 Essential (primary) hypertension: Secondary | ICD-10-CM | POA: Diagnosis not present

## 2023-06-24 DIAGNOSIS — R6889 Other general symptoms and signs: Secondary | ICD-10-CM | POA: Diagnosis not present

## 2023-06-24 DIAGNOSIS — Z950 Presence of cardiac pacemaker: Secondary | ICD-10-CM | POA: Diagnosis not present

## 2023-06-24 DIAGNOSIS — R61 Generalized hyperhidrosis: Secondary | ICD-10-CM | POA: Diagnosis not present

## 2023-06-24 LAB — COMPREHENSIVE METABOLIC PANEL WITH GFR
ALT: 70 U/L — ABNORMAL HIGH (ref 0–44)
AST: 37 U/L (ref 15–41)
Albumin: 3.7 g/dL (ref 3.5–5.0)
Alkaline Phosphatase: 73 U/L (ref 38–126)
Anion gap: 9 (ref 5–15)
BUN: 12 mg/dL (ref 8–23)
CO2: 23 mmol/L (ref 22–32)
Calcium: 9 mg/dL (ref 8.9–10.3)
Chloride: 105 mmol/L (ref 98–111)
Creatinine, Ser: 1.01 mg/dL (ref 0.61–1.24)
GFR, Estimated: >60 mL/min (ref >60)
Glucose, Bld: 269 mg/dL — ABNORMAL HIGH (ref 70–99)
Potassium: 4 mmol/L (ref 3.5–5.1)
Sodium: 137 mmol/L (ref 135–145)
Total Bilirubin: 0.9 mg/dL (ref 0.0–1.2)
Total Protein: 6.8 g/dL (ref 6.5–8.1)

## 2023-06-24 LAB — CBC WITH DIFFERENTIAL/PLATELET
Abs Immature Granulocytes: 0.05 10*3/uL (ref 0.00–0.07)
Basophils Absolute: 0.1 10*3/uL (ref 0.0–0.1)
Basophils Relative: 1 %
Eosinophils Absolute: 0.3 10*3/uL (ref 0.0–0.5)
Eosinophils Relative: 3 %
HCT: 47.5 % (ref 39.0–52.0)
Hemoglobin: 15.6 g/dL (ref 13.0–17.0)
Immature Granulocytes: 1 %
Lymphocytes Relative: 9 %
Lymphs Abs: 0.9 10*3/uL (ref 0.7–4.0)
MCH: 30.2 pg (ref 26.0–34.0)
MCHC: 32.8 g/dL (ref 30.0–36.0)
MCV: 92.1 fL (ref 80.0–100.0)
Monocytes Absolute: 0.2 10*3/uL (ref 0.1–1.0)
Monocytes Relative: 2 %
Neutro Abs: 8.6 10*3/uL — ABNORMAL HIGH (ref 1.7–7.7)
Neutrophils Relative %: 84 %
Platelets: 206 10*3/uL (ref 150–400)
RBC: 5.16 MIL/uL (ref 4.22–5.81)
RDW: 14.1 % (ref 11.5–15.5)
WBC: 10.2 10*3/uL (ref 4.0–10.5)
nRBC: 0 % (ref 0.0–0.2)

## 2023-06-24 LAB — BRAIN NATRIURETIC PEPTIDE: B Natriuretic Peptide: 407.8 pg/mL — ABNORMAL HIGH (ref 0.0–100.0)

## 2023-06-24 LAB — TROPONIN I (HIGH SENSITIVITY)
Troponin I (High Sensitivity): 18 ng/L — ABNORMAL HIGH (ref <18)
Troponin I (High Sensitivity): 16 ng/L (ref <18)

## 2023-06-24 MED ORDER — ALBUTEROL SULFATE HFA 108 (90 BASE) MCG/ACT IN AERS
2.0000 | INHALATION_SPRAY | Freq: Once | RESPIRATORY_TRACT | Status: AC
  Administered 2023-06-24: 2 via RESPIRATORY_TRACT
  Filled 2023-06-24: qty 6.7

## 2023-06-24 MED ORDER — FUROSEMIDE 20 MG PO TABS: 20.0000 mg | ORAL_TABLET | Freq: Every day | ORAL | 0 refills | Status: DC

## 2023-06-24 MED ORDER — AMOXICILLIN-POT CLAVULANATE 875-125 MG PO TABS: 1.0000 | ORAL_TABLET | Freq: Two times a day (BID) | ORAL | 0 refills | Status: DC

## 2023-06-24 MED ORDER — PREDNISONE 10 MG PO TABS: 40.0000 mg | ORAL_TABLET | Freq: Every day | ORAL | 0 refills | Status: AC

## 2023-06-24 MED ORDER — DOXYCYCLINE HYCLATE 100 MG PO CAPS: 100.0000 mg | ORAL_CAPSULE | Freq: Two times a day (BID) | ORAL | 0 refills | Status: DC

## 2023-06-24 MED ORDER — IPRATROPIUM-ALBUTEROL 0.5-2.5 (3) MG/3ML IN SOLN
3.0000 mL | Freq: Once | RESPIRATORY_TRACT | Status: AC
  Administered 2023-06-24: 3 mL via RESPIRATORY_TRACT
  Filled 2023-06-24: qty 3

## 2023-06-24 MED ORDER — IOHEXOL 350 MG/ML SOLN
75.0000 mL | Freq: Once | INTRAVENOUS | Status: AC | PRN
  Administered 2023-06-24: 75 mL via INTRAVENOUS

## 2023-06-24 NOTE — ED Triage Notes (Signed)
 PT arrives via EMS from home. PT reports being treated for pneumonia a couple weeks ago. States he was feeling good until yesterday. He reports SOB with exertion and productive cough since yesterday. EMS administered 125mg  of solumedrol, atrovent, and albuterol . Pt states that significantly helped with his breathing. Pt is AxOx4.

## 2023-06-24 NOTE — ED Provider Notes (Signed)
 [place] EMERGENCY DEPARTMENT AT [facility] Provider Note   CSN: [id number] Arrival date & time: 06/24/23  1015     Patient presents with: Shortness of Breath   [name] is a 78 y.o. male.   HPI      60 male with a history of MI, coronary artery disease, hypertension, hyperlipidemia, diabetes, atrial fibrillation on eliquis , ischemic cardiomyopathy with ICD who presents with concern for shortness of breath.  Sudden last night, shortness of breath, broke into a sweat, rattling  Until got home and laid down in the bed Blood pressure was 189/107 with HR of 100 at the time, just kept checking it and then it decreased to 150, HR 80  Has inhaler, used that--no hx of COPD/asthma, just finished 2 rounds of bronchitis last week and felt like it was starting last week  No chest pain Right now feeling better Got steroids and nebulizer in the ambulance and feel like that helped Not rattling like he was No fever No leg pain or swelling Vomited this AM, dry heaves    Past Medical History:  Diagnosis Date   Arthritis    fingers, shoulders (08/03/2015)   Atrial fibrillation (HCC)    Paroxysmal, rare episodes, sinus rhythm on flecainide , patient prefers not to take Coumadin   Bradycardia    April, 2013   Chest pain    Nuclear, 2006, no ischemia   Chronic lower back pain    all my life   Coronary artery disease    s/p staged cath 05/12/2014 and 5/11, DES x 2 to heavily calcified RCA, residual with 60% prox LAD, 70% D1   Ejection fraction    EF 55-60%,  echo, January, 2011   Elevated CPK    CPK elevated with normal MB and normal troponin the past   Heart murmur    History of echocardiogram    Echo 8/16: EF 55%, normal wall motion, grade 2 diastolic dysfunction, trivial MR, normal RV function, PASP 27 mmHg   Hypertension    IBS (irritable bowel syndrome)    Myocardial infarction (HCC) 06/2014   NSTEMI (non-ST elevated myocardial infarction) (HCC) 08/02/2015    Sinus drainage    Type II diabetes mellitus (HCC)    Unstable angina (HCC) 01/05/2019     Prior to Admission medications   Medication Sig Start Date End Date Taking? Authorizing Provider  amoxicillin -clavulanate (AUGMENTIN) 875-125 MG tablet Take 1 tablet by mouth every 12 (twelve) hours. 06/24/23  Yes Dreama Longs, MD  doxycycline (VIBRAMYCIN) 100 MG capsule Take 1 capsule (100 mg total) by mouth 2 (two) times daily for 7 days. 06/24/23 07/01/23 Yes Dreama Longs, MD  furosemide  (LASIX ) 20 MG tablet Take 1 tablet (20 mg total) by mouth daily for 3 days. 06/24/23 06/27/23 Yes Dreama Longs, MD  predniSONE  (DELTASONE ) 10 MG tablet Take 4 tablets (40 mg total) by mouth daily for 5 days. 06/24/23 06/29/23 Yes Dreama Longs, MD  albuterol  (VENTOLIN  HFA) 108 (90 Base) MCG/ACT inhaler Inhale 2 puffs into the lungs every 6 (six) hours as needed for wheezing or shortness of breath. 11/14/21   Copland, Jacques, MD  carvedilol  (COREG ) 6.25 MG tablet Take 1 tablet (6.25 mg total) by mouth 2 (two) times daily with a meal. 06/06/23   Weaver, Glendia T, PA-C  Continuous Glucose Sensor (FREESTYLE LIBRE 3 SENSOR) MISC Place 1 sensor on the skin every 14 days. Use to check glucose continuously 05/04/23   Clark, Katherine K, NP  dapagliflozin  propanediol (FARXIGA )  10 MG TABS tablet Take 1 tablet (10 mg total) by mouth daily before breakfast. For diabetes. 12/14/21   Clark, Katherine K, NP  ELIQUIS  5 MG TABS tablet TAKE 1 TABLET BY MOUTH TWICE A DAY 03/01/23   Jeffrie Oneil BROCKS, MD  Evolocumab  (REPATHA  SURECLICK) 140 MG/ML SOAJ Inject 140 mg into the skin every 14 (fourteen) days. 04/13/23   Jeffrie Oneil BROCKS, MD  furosemide  (LASIX ) 20 MG tablet TAKE 1 TABLET BY MOUTH DAILY AS NEEDED FOR WEIGHT GAIN OF 3 LBS IN 1 DAY OR 5 LBS IN 1 WEEK 04/03/23   Lelon, Glendia T, PA-C  insulin  aspart (NOVOLOG  FLEXPEN) 100 UNIT/ML FlexPen Inject 20 Units into the skin 3 (three) times daily with meals.    [provider]  insulin   degludec (TRESIBA  FLEXTOUCH) 200 UNIT/ML FlexTouch Pen Inject 100 Units into the skin daily. Via Novo Cares PAP 12/22/21   Clark, Katherine K, NP  Insulin  Pen Needle 31G X 8 MM MISC Use as directed with Lantus . 08/24/15   Clark, Katherine K, NP  isosorbide  mononitrate (IMDUR ) 30 MG 24 hr tablet TAKE 1 TABLET BY MOUTH EVERY DAY 08/28/22   Dann Candyce RAMAN, MD  nitroGLYCERIN  (NITROSTAT ) 0.4 MG SL tablet Place 1 tablet (0.4 mg total) under the tongue every 5 (five) minutes as needed for chest pain. 07/07/20   Dann Candyce RAMAN, MD  ondansetron  (ZOFRAN -ODT) 4 MG disintegrating tablet Take 1 tablet (4 mg total) by mouth every 8 (eight) hours as needed for nausea or vomiting. 11/30/21   Gretta Comer POUR, NP  OVER THE COUNTER MEDICATION Place 1 drop into both eyes 2 (two) times daily. Happy body  liquid msm eye drops/ for floaters in the eyes    [provider]  rosuvastatin  (CRESTOR ) 10 MG tablet Take 1 tablet (10 mg total) by mouth daily. 04/13/23 07/12/23  Jeffrie Oneil BROCKS, MD  sacubitril-valsartan (ENTRESTO ) 24-26 MG TAKE 1 TABLET BY MOUTH TWICE A DAY 03/22/23   Cindie Ole DASEN, MD  Semaglutide , 1 MG/DOSE, 4 MG/3ML SOPN Inject 1 mg as directed once a week. for diabetes. 08/22/22   Gretta Comer POUR, NP  tamsulosin  (FLOMAX ) 0.4 MG CAPS capsule TAKE 1 CAPSULE (0.4 MG TOTAL) BY MOUTH DAILY. FOR URINE FLOW. 04/02/22   Clark, Katherine K, NP    Allergies: Lipitor [atorvastatin ], Pravastatin , and Simvastatin     Review of Systems  Updated Vital Signs BP 125/72   Pulse 81   Temp 98.3 F (36.8 C) (Oral)   Resp (!) 27   SpO2 95%   Physical Exam Vitals and nursing note reviewed.  Constitutional:      General: He is not in acute distress.    Appearance: He is well-developed. He is not diaphoretic.  HENT:     Head: Normocephalic and atraumatic.   Eyes:     Conjunctiva/sclera: Conjunctivae normal.   Neck:     Vascular: No JVD.   Cardiovascular:     Rate and Rhythm: Normal rate and  regular rhythm.     Heart sounds: Normal heart sounds. No murmur heard.    No friction rub. No gallop.  Pulmonary:     Effort: Pulmonary effort is normal. No respiratory distress.     Breath sounds: Wheezing present. No rales.  Abdominal:     General: There is no distension.     Palpations: Abdomen is soft.     Tenderness: There is no abdominal tenderness. There is no guarding.   Musculoskeletal:     Cervical back:  Normal range of motion.   Skin:    General: Skin is warm and dry.   Neurological:     Mental Status: He is alert and oriented to person, place, and time.     (all labs ordered are listed, but only abnormal results are displayed) Labs Reviewed  CBC WITH DIFFERENTIAL/PLATELET - Abnormal; Notable for the following components:      Result Value   Neutro Abs 8.6 (*)    All other components within normal limits  COMPREHENSIVE METABOLIC PANEL WITH GFR - Abnormal; Notable for the following components:   Glucose, Bld 269 (*)    ALT 70 (*)    All other components within normal limits  BRAIN NATRIURETIC PEPTIDE - Abnormal; Notable for the following components:   B Natriuretic Peptide 407.8 (*)    All other components within normal limits  TROPONIN I (HIGH SENSITIVITY) - Abnormal; Notable for the following components:   Troponin I (High Sensitivity) 18 (*)    All other components within normal limits  TROPONIN I (HIGH SENSITIVITY)    EKG: EKG Interpretation Date/Time:  Sunday June 24 2023 10:23:16 EDT Ventricular Rate:  84 PR Interval:  183 QRS Duration:  104 QT Interval:  443 QTC Calculation: 524 R Axis:   102  Text Interpretation: Sinus rhythm Right axis deviation Borderline T abnormalities, lateral leads Prolonged QT interval No significant change since last tracing Confirmed by Dreama Longs ([id number]) on 06/24/2023 10:28:27 AM  Radiology: CT Angio Chest PE W and/or Wo Contrast Result Date: 06/24/2023 CLINICAL DATA:  Acute onset shortness of breath and cough.  Recently treated for pneumonia. EXAM: CT ANGIOGRAPHY CHEST WITH CONTRAST TECHNIQUE: Multidetector CT imaging of the chest was performed using the standard protocol during bolus administration of intravenous contrast. Multiplanar CT image reconstructions and MIPs were obtained to evaluate the vascular anatomy. RADIATION DOSE REDUCTION: This exam was performed according to the departmental dose-optimization program which includes automated exposure control, adjustment of the mA and/or kV according to patient size and/or use of iterative reconstruction technique. CONTRAST:  75mL OMNIPAQUE  IOHEXOL  350 MG/ML SOLN COMPARISON:  Same day chest radiograph FINDINGS: Cardiovascular: Left chest wall ICD lead terminates in the right ventricle. Left atrial appendage clip. The study is high quality for the evaluation of pulmonary embolism. There are no filling defects in the central, lobar, segmental or subsegmental pulmonary artery branches to suggest acute pulmonary embolism. Dilated main pulmonary artery measures 4.2 cm. Normal heart size. No significant pericardial fluid/thickening. Coronary artery calcifications and aortic atherosclerosis. Mediastinum/Nodes: Imaged thyroid  gland without nodules meeting criteria for imaging follow-up by size. Normal esophagus. No pathologically enlarged axillary, supraclavicular, mediastinal, or hilar lymph nodes. Lungs/Pleura: The central airways are patent. Mild diffuse bronchial thickening. No focal consolidation. No pneumothorax. No pleural effusion. Upper abdomen: Cholecystectomy. Musculoskeletal: No acute or abnormal lytic or blastic osseous lesions. Median sternotomy wires are nondisplaced. Multilevel degenerative changes of the thoracic spine. Review of the MIP images confirms the above findings. IMPRESSION: 1. No evidence of pulmonary embolism. 2. Mild diffuse bronchial thickening, which can be seen in the setting of bronchitis. 3. Dilated main pulmonary artery, which can be seen in  the setting of pulmonary hypertension. 4. Aortic Atherosclerosis (ICD10-I70.0). Coronary artery calcifications. Assessment for potential risk factor modification, dietary therapy or pharmacologic therapy may be warranted, if clinically indicated. Electronically Signed   By: Limin  Xu M.D.   On: 06/24/2023 14:02   DG Chest 2 View Result Date: 06/24/2023 CLINICAL DATA:  sob EXAM: CHEST - 2 VIEW  COMPARISON:  01/07/2019. FINDINGS: Cardiac silhouette enlarged. No evidence of pneumothorax or pleural effusion. No evidence of pulmonary edema. Calcified aorta. No osseous abnormalities identified. There are median sternotomy wires and prior CABG. There is a left atrial clip. Left-sided pacer. IMPRESSION: Enlarged cardiac silhouette.  Lungs are clear. Electronically Signed   By: Fonda Field M.D.   On: 06/24/2023 11:29     Procedures   Medications Ordered in the ED  ipratropium-albuterol  (DUONEB) 0.5-2.5 (3) MG/3ML nebulizer solution 3 mL (3 mLs Nebulization Given 06/24/23 1320)  iohexol  (OMNIPAQUE ) 350 MG/ML injection 75 mL (75 mLs Intravenous Contrast Given 06/24/23 1342)  albuterol  (VENTOLIN  HFA) 108 (90 Base) MCG/ACT inhaler 2 puff (2 puffs Inhalation Given 06/24/23 1540)                                     37 male with a history of MI, coronary artery disease, hypertension, hyperlipidemia, diabetes, atrial fibrillation on eliquis , ischemic cardiomyopathy with ICD who presents with concern for shortness of breath.  Differential diagnosis for dyspnea includes ACS, PE, COPD exacerbation, CHF exacerbation, anemia, pneumonia, viral etiology such as COVID 19 infection, metabolic abnormality.    EKG completed and evaluated by me without acute abnormalities.  Labs completed and personally evaluated interpreted by me show no anemia, no leukocytosis, CMP with mild hyperglycemia without DKA .  Troponin is mildly elevated and similar to prior value.  His BNP is increased from prior.  Chest x-ray completed  and shows enlarged cardiac silhouette, no sign of pneumonia or pulmonary edema on x-ray.  CT PE study completed given sudden onset dyspnea without clear etiology on XR--shows no evidence of PE, does show mild diffuse bronchial thickening which can be seen in the setting of bronchitis, dilated main pulmonary artery which can be seen in the setting of pulmonary hypertension.  Wheezing present on exam without hx of smoking or COPD--possible development of asthma, or bronchitis. Will treat as bacterial bronchitis with doxycycline and augmentin given he has been on course of prednisone  and azithromycin  in the last few weeks.  Given solumedrol, nebs with EMS an neb in ED with improvement. Is not hypoxic, stable for outpatient follow up.  Given ?pulmnary hypertension on CT, wheezing on exam, will refer to Pulmonology.  Given rx for doxycycline, augmentin and prednisone  for bacterial bronchitis. Given albuterol  inhaler.  Given lasix  for a few days. Recommend follow up as scheduled with Cardiology, PCP follow up, follow up with pulmonology.  Patient discharged in stable condition with understanding of reasons to return.       Final diagnoses:  Bronchitis  Shortness of breath  Elevated brain natriuretic peptide (BNP) level    ED Discharge Orders          Ordered    predniSONE  (DELTASONE ) 10 MG tablet  Daily        06/24/23 1517    doxycycline (VIBRAMYCIN) 100 MG capsule  2 times daily        06/24/23 1517    amoxicillin -clavulanate (AUGMENTIN) 875-125 MG tablet  Every 12 hours        06/24/23 1517    furosemide  (LASIX ) 20 MG tablet  Daily        06/24/23 1517    Ambulatory referral to Pulmonology        06/24/23 1517               Dreama Longs, MD 06/24/23 2159

## 2023-06-25 ENCOUNTER — Other Ambulatory Visit: Payer: Self-pay

## 2023-06-25 MED ORDER — FUROSEMIDE 20 MG PO TABS
ORAL_TABLET | ORAL | 3 refills | Status: DC
Start: 2023-06-25 — End: 2023-06-29

## 2023-06-27 NOTE — Telephone Encounter (Addendum)
 Remote Transmission Reviewed. Normal Device Function. Patient has been back in AF with elevated V rates frequently since early June.  HG's with elevated rates. He states he has been battling bronchitis X 3 weeks.  Is on prednisone , inhalers and antibiotics.   He is starting to feel better today but does have intermittent SOB, palpitations, fatigue and chest congestion.   HF logistics are high for risk of heart failure complications.  He is taking all of his medications.   He will follow up with Dr. Cindie on Friday at 415. Has ER precautions if symptoms get worse.

## 2023-06-27 NOTE — Telephone Encounter (Signed)
 Pt transmission was received 06/27/2023. I told the pt wife that the nurse will review it and give them a call back. She verbalized understanding.

## 2023-06-28 ENCOUNTER — Telehealth: Payer: Self-pay | Admitting: Pharmacist

## 2023-06-28 NOTE — Telephone Encounter (Signed)
 Follow-up call regarding lipid lab and Repatha : Lipid lab follow-up is due; Repatha  was started in April 2025. Patient reports he forgot about the injections. He has the medication stored in his fridge and took only one dose when he first received the prescription. Patient plans to restart Repatha  and will call back in 3 months for a reminder about the follow-up lipid lab.

## 2023-06-29 ENCOUNTER — Other Ambulatory Visit: Payer: Self-pay

## 2023-06-29 ENCOUNTER — Ambulatory Visit: Attending: Cardiology | Admitting: Cardiology

## 2023-06-29 ENCOUNTER — Encounter: Payer: Self-pay | Admitting: Cardiology

## 2023-06-29 VITALS — BP 131/75 | HR 79 | Ht 68.0 in | Wt 200.0 lb

## 2023-06-29 DIAGNOSIS — I480 Paroxysmal atrial fibrillation: Secondary | ICD-10-CM

## 2023-06-29 DIAGNOSIS — I5022 Chronic systolic (congestive) heart failure: Secondary | ICD-10-CM | POA: Diagnosis not present

## 2023-06-29 DIAGNOSIS — I25708 Atherosclerosis of coronary artery bypass graft(s), unspecified, with other forms of angina pectoris: Secondary | ICD-10-CM

## 2023-06-29 DIAGNOSIS — Z95810 Presence of automatic (implantable) cardiac defibrillator: Secondary | ICD-10-CM | POA: Diagnosis not present

## 2023-06-29 NOTE — H&P (View-Only) (Signed)
  Electrophysiology Office Follow up Visit Note:    Date:  06/29/2023   ID:  [name], DOB April 25, 1945, MRN [id number]  PCP:  [name] Comer POUR, NP  [facility] Cardiologist:  [name] Parchment, MD  [facility] Electrophysiologist:  [name] ONEIDA HOLTS, MD    Interval History:     [name] is a 78 y.o. male who presents for a follow up visit.   He last saw and the March 27, 2023.  He has a history of coronary artery disease with prior CABG and PCI, atrial fibrillation with prior maze and left atrial appendage clipping during CABG procedure in 2016, ischemic cardiomyopathy with ICD in situ.  At the appointment with [name] he reported some shortness of breath with moderate exertion.  No syncope.  No high-voltage therapies.  The patient is with his wife today in clinic.  He reports intermittent shortness of breath.  There was an episode recently that was quite severe when he was out with friends.  It came on suddenly.        Past medical, surgical, social and family history were reviewed.  ROS:   Please see the history of present illness.    All other systems reviewed and are negative.  EKGs/Labs/Other Studies Reviewed:    The following studies were reviewed today:  June 29, 2023 in clinic device interrogation personally reviewed Battery longevity 8 years A-fib burden 1.7% Lead parameter stable No high-voltage therapies  May 29, 2023 stress test shows circumflex territory ischemia and infarction/ischemia in the RCA territory.       Physical Exam:    VS:  BP 131/75   Pulse 79   Ht 5' 8 (1.727 m)   Wt 200 lb (90.7 kg)   SpO2 98%   BMI 30.41 kg/m     Wt Readings from Last 3 Encounters:  06/29/23 200 lb (90.7 kg)  04/10/23 206 lb (93.4 kg)  03/27/23 204 lb 12.8 oz (92.9 kg)     GEN: no distress CARD: RRR, No MRG.  CIED pocket well-healed RESP: No IWOB. CTAB.      ASSESSMENT:    1. Paroxysmal atrial fibrillation (HCC)   2. Chronic systolic heart  failure (HCC)   3. ICD (implantable cardioverter-defibrillator) in place   4. Coronary artery disease of bypass graft of native heart with stable angina pectoris (HCC)    PLAN:    In order of problems listed above:  #Ischemic cardiomyopathy #Chronic systolic heart failure #ICD in situ NYHA class II-III symptoms.  On good GDMT. ICD functioning appropriately.  Continue remote monitoring.  Known RV lead noise with isometrics.  #Shortness of breath Concerning for ischemic equivalent as below.  I am also going to prescribe him a spacer for his inhaler to make sure that he is actually getting this medication.  #Paroxysmal atrial fibrillation Continue Eliquis    #Coronary artery disease I am concerned that some of his shortness of breath episodes are actually severe ischemia.  This is further supported by recent abnormal stress test with circumflex territory ischemia.  I have recommended a left heart catheterization today.  I discussed the procedure and he wishes to proceed.  He will follow-up with general cardiology after this.  Follow-up 1 year with APP   Signed, [name] HOLTS, MD, [facility], [facility] 06/29/2023 4:31 PM    Electrophysiology [facility]

## 2023-06-29 NOTE — Progress Notes (Signed)
  Electrophysiology Office Follow up Visit Note:    Date:  06/29/2023   ID:  [name], DOB April 25, 1945, MRN [id number]  PCP:  Gretta Comer POUR, NP  [facility] Cardiologist:  Oneil Parchment, MD  [facility] Electrophysiologist:  OLE ONEIDA HOLTS, MD    Interval History:     [name] is a 78 y.o. male who presents for a follow up visit.   He last saw and the March 27, 2023.  He has a history of coronary artery disease with prior CABG and PCI, atrial fibrillation with prior maze and left atrial appendage clipping during CABG procedure in 2016, ischemic cardiomyopathy with ICD in situ.  At the appointment with Jodie he reported some shortness of breath with moderate exertion.  No syncope.  No high-voltage therapies.  The patient is with his wife today in clinic.  He reports intermittent shortness of breath.  There was an episode recently that was quite severe when he was out with friends.  It came on suddenly.        Past medical, surgical, social and family history were reviewed.  ROS:   Please see the history of present illness.    All other systems reviewed and are negative.  EKGs/Labs/Other Studies Reviewed:    The following studies were reviewed today:  June 29, 2023 in clinic device interrogation personally reviewed Battery longevity 8 years A-fib burden 1.7% Lead parameter stable No high-voltage therapies  May 29, 2023 stress test shows circumflex territory ischemia and infarction/ischemia in the RCA territory.       Physical Exam:    VS:  BP 131/75   Pulse 79   Ht 5' 8 (1.727 m)   Wt 200 lb (90.7 kg)   SpO2 98%   BMI 30.41 kg/m     Wt Readings from Last 3 Encounters:  06/29/23 200 lb (90.7 kg)  04/10/23 206 lb (93.4 kg)  03/27/23 204 lb 12.8 oz (92.9 kg)     GEN: no distress CARD: RRR, No MRG.  CIED pocket well-healed RESP: No IWOB. CTAB.      ASSESSMENT:    1. Paroxysmal atrial fibrillation (HCC)   2. Chronic systolic heart  failure (HCC)   3. ICD (implantable cardioverter-defibrillator) in place   4. Coronary artery disease of bypass graft of native heart with stable angina pectoris (HCC)    PLAN:    In order of problems listed above:  #Ischemic cardiomyopathy #Chronic systolic heart failure #ICD in situ NYHA class II-III symptoms.  On good GDMT. ICD functioning appropriately.  Continue remote monitoring.  Known RV lead noise with isometrics.  #Shortness of breath Concerning for ischemic equivalent as below.  I am also going to prescribe him a spacer for his inhaler to make sure that he is actually getting this medication.  #Paroxysmal atrial fibrillation Continue Eliquis    #Coronary artery disease I am concerned that some of his shortness of breath episodes are actually severe ischemia.  This is further supported by recent abnormal stress test with circumflex territory ischemia.  I have recommended a left heart catheterization today.  I discussed the procedure and he wishes to proceed.  He will follow-up with general cardiology after this.  Follow-up 1 year with APP   Signed, OLE HOLTS, MD, [facility], [facility] 06/29/2023 4:31 PM    Electrophysiology [facility]

## 2023-06-29 NOTE — Patient Instructions (Signed)
 Medication Instructions:  Your physician recommends that you continue on your current medications as directed. Please refer to the Current Medication list given to you today.  *If you need a refill on your cardiac medications before your next appointment, please call your pharmacy*  Lab Work: BMET and CBC  Testing/Procedures: Heart Catheterization  Your physician has requested that you have a cardiac catheterization. Cardiac catheterization is used to diagnose and/or treat various heart conditions. Doctors may recommend this procedure for a number of different reasons. The most common reason is to evaluate chest pain. Chest pain can be a symptom of coronary artery disease (CAD), and cardiac catheterization can show whether plaque is narrowing or blocking your heart's arteries. This procedure is also used to evaluate the valves, as well as measure the blood flow and oxygen levels in different parts of your heart. For further information please visit [email or web address] Please follow instruction sheet, as given.   Follow-Up: At [facility], you and your health needs are our priority.  As part of our continuing mission to provide you with exceptional heart care, our providers are all part of one team.  This team includes your primary Cardiologist (physician) and Advanced Practice Providers or APPs (Physician Assistants and Nurse Practitioners) who all work together to provide you with the care you need, when you need it.  Your next appointment:   1 year  Provider:   You may see OLE ONEIDA HOLTS, MD or one of the following Advanced Practice Providers on your designated Care Team:   Charlies Arthur, [place] Ozell Jodie Passey, PA-C Suzann Riddle, NP Daphne Barrack, NP

## 2023-06-30 LAB — BASIC METABOLIC PANEL WITH GFR
BUN/Creatinine Ratio: 16 (ref 10–24)
BUN: 23 mg/dL (ref 8–27)
CO2: 20 mmol/L (ref 20–29)
Calcium: 9 mg/dL (ref 8.6–10.2)
Chloride: 103 mmol/L (ref 96–106)
Creatinine, Ser: 1.44 mg/dL — AB (ref 0.76–1.27)
Glucose: 138 mg/dL — AB (ref 70–99)
Potassium: 3.7 mmol/L (ref 3.5–5.2)
Sodium: 138 mmol/L (ref 134–144)
eGFR: 50 mL/min/{1.73_m2} — AB (ref >59)

## 2023-06-30 LAB — CBC
Hematocrit: 47.9 % (ref 37.5–51.0)
Hemoglobin: 15.7 g/dL (ref 13.0–17.7)
MCH: 30.4 pg (ref 26.6–33.0)
MCHC: 32.8 g/dL (ref 31.5–35.7)
MCV: 93 fL (ref 79–97)
Platelets: 251 10*3/uL (ref 150–450)
RBC: 5.16 x10E6/uL (ref 4.14–5.80)
RDW: 13.7 % (ref 11.6–15.4)
WBC: 12.4 10*3/uL — ABNORMAL HIGH (ref 3.4–10.8)

## 2023-07-02 ENCOUNTER — Telehealth: Payer: Self-pay | Admitting: *Deleted

## 2023-07-02 MED ORDER — SPACER/AERO-HOLD CHAMBER BAGS MISC: 1.0000 | 3 refills | Status: AC | PRN

## 2023-07-02 NOTE — Telephone Encounter (Signed)
 Cardiac Catheterization scheduled at [facility] for: Tuesday July 03, 2023 10:30 AM Arrival time [facility] Main Entrance A at: 8:30 AM  Nothing to eat after midnight prior to procedure, clear liquids until 5 AM day of procedure.  Medication instructions: -Hold:  Eliquis -pt reports none 07/01/23 and knows to hold until post procedure  Insulin /Tresiba -AM of procedure-pt reports does not use Insulin  HS  Farxiga -AM of procedure  Semiglutide weekly-pt reports he has not taken in at least 1 week -Other usual morning medications can be taken with sips of water  including aspirin  81 mg.  Plan to go home the same day, you will only stay overnight if medically necessary.  You must have responsible adult to drive you home.  Someone must be with you the first 24 hours after you arrive home.  Reviewed procedure instructions with patient.

## 2023-07-02 NOTE — Addendum Note (Signed)
 Addended by: CHAUVIGNE, Isidore Margraf on: 07/02/2023 04:24 PM   Modules accepted: Orders

## 2023-07-03 ENCOUNTER — Other Ambulatory Visit: Payer: Self-pay

## 2023-07-03 ENCOUNTER — Ambulatory Visit (HOSPITAL_COMMUNITY)
Admission: RE | Admit: 2023-07-03 | Discharge: 2023-07-03 | Disposition: A | Discharge status: Home / Self Care | Attending: Cardiology | Admitting: Cardiology

## 2023-07-03 ENCOUNTER — Encounter (HOSPITAL_COMMUNITY)
Admission: RE | Disposition: A | Discharge status: Home / Self Care | Payer: Self-pay | Source: Home / Self Care | Attending: Cardiology

## 2023-07-03 ENCOUNTER — Other Ambulatory Visit (HOSPITAL_COMMUNITY): Payer: Self-pay

## 2023-07-03 ENCOUNTER — Encounter (HOSPITAL_COMMUNITY): Payer: Self-pay | Admitting: Cardiology

## 2023-07-03 DIAGNOSIS — I25718 Atherosclerosis of autologous vein coronary artery bypass graft(s) with other forms of angina pectoris: Secondary | ICD-10-CM

## 2023-07-03 DIAGNOSIS — I110 Hypertensive heart disease with heart failure: Secondary | ICD-10-CM | POA: Diagnosis not present

## 2023-07-03 DIAGNOSIS — Z95810 Presence of automatic (implantable) cardiac defibrillator: Secondary | ICD-10-CM | POA: Diagnosis not present

## 2023-07-03 DIAGNOSIS — I5022 Chronic systolic (congestive) heart failure: Secondary | ICD-10-CM | POA: Insufficient documentation

## 2023-07-03 DIAGNOSIS — I2510 Atherosclerotic heart disease of native coronary artery without angina pectoris: Secondary | ICD-10-CM | POA: Diagnosis present

## 2023-07-03 DIAGNOSIS — I255 Ischemic cardiomyopathy: Secondary | ICD-10-CM | POA: Diagnosis not present

## 2023-07-03 DIAGNOSIS — I25118 Atherosclerotic heart disease of native coronary artery with other forms of angina pectoris: Secondary | ICD-10-CM

## 2023-07-03 DIAGNOSIS — I2582 Chronic total occlusion of coronary artery: Secondary | ICD-10-CM | POA: Insufficient documentation

## 2023-07-03 DIAGNOSIS — I25810 Atherosclerosis of coronary artery bypass graft(s) without angina pectoris: Secondary | ICD-10-CM | POA: Diagnosis present

## 2023-07-03 DIAGNOSIS — Z955 Presence of coronary angioplasty implant and graft: Secondary | ICD-10-CM

## 2023-07-03 DIAGNOSIS — I480 Paroxysmal atrial fibrillation: Secondary | ICD-10-CM | POA: Diagnosis not present

## 2023-07-03 DIAGNOSIS — Z7901 Long term (current) use of anticoagulants: Secondary | ICD-10-CM | POA: Insufficient documentation

## 2023-07-03 HISTORY — PX: CORONARY STENT INTERVENTION: CATH118234

## 2023-07-03 HISTORY — PX: LEFT HEART CATH AND CORS/GRAFTS ANGIOGRAPHY: CATH118250

## 2023-07-03 LAB — GLUCOSE, CAPILLARY
Glucose-Capillary: 193 mg/dL — ABNORMAL HIGH (ref 70–99)
Glucose-Capillary: 117 mg/dL — ABNORMAL HIGH (ref 70–99)

## 2023-07-03 LAB — POCT ACTIVATED CLOTTING TIME
Activated Clotting Time: 216 s
Activated Clotting Time: 302 s
Activated Clotting Time: 268 s
Activated Clotting Time: 308 s

## 2023-07-03 SURGERY — LEFT HEART CATH AND CORS/GRAFTS ANGIOGRAPHY
Anesthesia: LOCAL

## 2023-07-03 MED ORDER — SODIUM CHLORIDE 0.9 % WEIGHT BASED INFUSION: 1.0000 mL/kg/h | INTRAVENOUS | Status: DC

## 2023-07-03 MED ORDER — VERAPAMIL HCL 2.5 MG/ML IV SOLN
INTRAVENOUS | Status: AC
  Filled 2023-07-03: qty 2

## 2023-07-03 MED ORDER — SODIUM CHLORIDE 0.9 % IV SOLN
INTRAVENOUS | Status: DC | PRN
  Administered 2023-07-03: 250 mL via INTRAVENOUS

## 2023-07-03 MED ORDER — IOHEXOL 350 MG/ML SOLN
INTRAVENOUS | Status: DC | PRN
  Administered 2023-07-03: 160 mL

## 2023-07-03 MED ORDER — SODIUM CHLORIDE 0.9 % IV SOLN
250.0000 mL | INTRAVENOUS | Status: DC | PRN
Start: 2023-07-03 — End: 2023-07-03

## 2023-07-03 MED ORDER — LIDOCAINE HCL (PF) 1 % IJ SOLN
INTRAMUSCULAR | Status: DC | PRN
  Administered 2023-07-03: 5 mL

## 2023-07-03 MED ORDER — CLOPIDOGREL BISULFATE 75 MG PO TABS
75.0000 mg | ORAL_TABLET | Freq: Every day | ORAL | 3 refills | Status: AC
  Filled 2023-07-03 – 2023-10-31 (×2): qty 90, 90d supply, fill #0

## 2023-07-03 MED ORDER — NITROGLYCERIN 1 MG/10 ML FOR IR/CATH LAB
INTRA_ARTERIAL | Status: AC
  Filled 2023-07-03: qty 10

## 2023-07-03 MED ORDER — VERAPAMIL HCL 2.5 MG/ML IV SOLN
INTRAVENOUS | Status: DC | PRN
  Administered 2023-07-03: 10 mL via INTRA_ARTERIAL

## 2023-07-03 MED ORDER — HEPARIN SODIUM (PORCINE) 1000 UNIT/ML IJ SOLN
INTRAMUSCULAR | Status: AC
  Filled 2023-07-03: qty 10

## 2023-07-03 MED ORDER — CLOPIDOGREL BISULFATE 300 MG PO TABS
ORAL_TABLET | ORAL | Status: AC
  Filled 2023-07-03: qty 2

## 2023-07-03 MED ORDER — NITROGLYCERIN 1 MG/10 ML FOR IR/CATH LAB
INTRA_ARTERIAL | Status: DC | PRN
  Administered 2023-07-03 (×2): 200 ug via INTRACORONARY

## 2023-07-03 MED ORDER — CLOPIDOGREL BISULFATE 75 MG PO TABS: 75.0000 mg | ORAL_TABLET | Freq: Every day | ORAL | Status: DC

## 2023-07-03 MED ORDER — ASPIRIN 81 MG PO CHEW: 81.0000 mg | CHEWABLE_TABLET | ORAL | Status: DC

## 2023-07-03 MED ORDER — LIDOCAINE HCL (PF) 1 % IJ SOLN
INTRAMUSCULAR | Status: AC
Start: 2023-07-03 — End: 2023-07-03
  Filled 2023-07-03: qty 30

## 2023-07-03 MED ORDER — SODIUM CHLORIDE 0.9% FLUSH: 3.0000 mL | Freq: Two times a day (BID) | INTRAVENOUS | Status: DC

## 2023-07-03 MED ORDER — SODIUM CHLORIDE 0.9% FLUSH: 3.0000 mL | INTRAVENOUS | Status: DC | PRN

## 2023-07-03 MED ORDER — SODIUM CHLORIDE 0.9 % WEIGHT BASED INFUSION: 3.0000 mL/kg/h | INTRAVENOUS | Status: AC

## 2023-07-03 MED ORDER — HEPARIN (PORCINE) IN NACL 1000-0.9 UT/500ML-% IV SOLN
INTRAVENOUS | Status: DC | PRN
  Administered 2023-07-03 (×2): 500 mL

## 2023-07-03 MED ORDER — ACETAMINOPHEN 325 MG PO TABS: 650.0000 mg | ORAL_TABLET | ORAL | Status: DC | PRN

## 2023-07-03 MED ORDER — HEPARIN SODIUM (PORCINE) 1000 UNIT/ML IJ SOLN
INTRAMUSCULAR | Status: DC | PRN
  Administered 2023-07-03: 5000 [IU] via INTRAVENOUS
  Administered 2023-07-03: 4000 [IU] via INTRAVENOUS
  Administered 2023-07-03: 5000 [IU] via INTRAVENOUS
  Administered 2023-07-03: 3000 [IU] via INTRAVENOUS

## 2023-07-03 MED ORDER — HYDRALAZINE HCL 20 MG/ML IJ SOLN: 10.0000 mg | INTRAMUSCULAR | Status: DC | PRN

## 2023-07-03 MED ORDER — ONDANSETRON HCL 4 MG/2ML IJ SOLN: 4.0000 mg | Freq: Four times a day (QID) | INTRAMUSCULAR | Status: DC | PRN

## 2023-07-03 MED ORDER — SODIUM CHLORIDE 0.9 % IV SOLN: INTRAVENOUS | Status: DC

## 2023-07-03 MED ORDER — CLOPIDOGREL BISULFATE 300 MG PO TABS
ORAL_TABLET | ORAL | Status: DC | PRN
  Administered 2023-07-03: 600 mg via ORAL

## 2023-07-03 MED ORDER — LABETALOL HCL 5 MG/ML IV SOLN: 10.0000 mg | INTRAVENOUS | Status: DC | PRN

## 2023-07-03 SURGICAL SUPPLY — 21 items
BALLOON EMERGE MR 2.0X15 (BALLOONS) IMPLANT
BALLOON EMERGE MR PUSH 1.5X15 (BALLOONS) IMPLANT
BALLOON ~~LOC~~ EMERGE MR 2.25X15 (BALLOONS) IMPLANT
BALLOON ~~LOC~~ EMERGE MR 2.75X12 (BALLOONS) IMPLANT
CATH INFINITI 5 FR IM (CATHETERS) IMPLANT
CATH INFINITI 5FR MULTPACK ANG (CATHETERS) IMPLANT
CATH LAUNCHER 6FR EBU3.5 (CATHETERS) IMPLANT
CATH VISTA GUIDE RCB (CATHETERS) IMPLANT
DEVICE RAD COMP TR BAND LRG (VASCULAR PRODUCTS) IMPLANT
ELECT DEFIB PAD ADLT CADENCE (PAD) IMPLANT
GLIDESHEATH SLEND A-KIT 6F 22G (SHEATH) IMPLANT
GUIDEWIRE INQWIRE 1.5J.035X260 (WIRE) IMPLANT
KIT ENCORE 26 ADVANTAGE (KITS) IMPLANT
KIT HEMO VALVE WATCHDOG (MISCELLANEOUS) IMPLANT
PACK CARDIAC CATHETERIZATION (CUSTOM PROCEDURE TRAY) ×1 IMPLANT
SET ATX-X65L (MISCELLANEOUS) IMPLANT
SHEATH PROBE COVER 6X72 (BAG) IMPLANT
STENT ONYX FRONTIER 2.0X22 (Permanent Stent) IMPLANT
STENT ONYX FRONTIER 2.5X26 (Permanent Stent) IMPLANT
WIRE ASAHI PROWATER 180CM (WIRE) IMPLANT
WIRE RUNTHROUGH .014X180CM (WIRE) IMPLANT

## 2023-07-03 NOTE — Progress Notes (Signed)
 Pt was educated on stent card, stent location, Antiplatelet and ASA use, wt restrictions, no baths/daily wash-ups, s/s of infection, ex guidelines, s/s to stop exercising, NTG use and calling 911, heart healthy diet, risk factors and CRPII. Pt received materials on exercise, diet, and CRPII. Will refer to [facility].   Pt is not interested in CR, pt completed CR after CABG  Garen FORBES Candy MS, ACSM-CEP 07/03/2023 1:48 PM

## 2023-07-03 NOTE — Interval H&P Note (Signed)
 History and Physical Interval Note:  07/03/2023 10:12 AM  [name]  has presented today for surgery, with the diagnosis of shortness of breath.  The various methods of treatment have been discussed with the patient and family. After consideration of risks, benefits and other options for treatment, the patient has consented to  Procedure(s): LEFT HEART CATH AND CORS/GRAFTS ANGIOGRAPHY (N/A) as a surgical intervention.  The patient's history has been reviewed, patient examined, no change in status, stable for surgery.  I have reviewed the patient's chart and labs.  Questions were answered to the patient's satisfaction.     [name] [name]

## 2023-07-03 NOTE — Discharge Instructions (Signed)
 Radial Site Care The following information offers guidance on how to care for yourself after your procedure. Your health care provider may also give you more specific instructions. If you have problems or questions, contact your health care provider. What can I expect after the procedure? After the procedure, it is common to have bruising and tenderness in the incision area. Follow these instructions at home: Incision site care  Follow instructions from your health care provider about how to take care of your incision site. Make sure you: Wash your hands with soap and water for at least 20 seconds before and after you change your bandage (dressing). If soap and water are not available, use hand sanitizer. Remove your dressing in 24 hours. Leave stitches (sutures), skin glue, or adhesive strips in place. These skin closures may need to stay in place for 2 weeks or longer. If adhesive strip edges start to loosen and curl up, you may trim the loose edges. Do not remove adhesive strips completely unless your health care provider tells you to do that. Do not take baths, swim, or use a hot tub for at least 1 week. You may shower 24 hours after the procedure or as told by your health care provider. Remove the dressing and gently wash the incision area with plain soap and water. Pat the area dry with a clean towel. Do not rub the site. That could cause bleeding. Do not apply powder or lotion to the site. Check your incision site every day for signs of infection. Check for: Redness, swelling, or pain. Fluid or blood. Warmth. Pus or a bad smell. Activity For 24 hours after the procedure, or as directed by your health care provider: Do not flex or bend the affected arm. Do not push or pull heavy objects with the affected arm. Do not operate machinery or power tools. Do not drive. You should not drive yourself home from the hospital or clinic if you go home during that time period. You may drive 24  hours after the procedure unless your health care provider tells you not to. Do not lift anything that is heavier than 10 lb (4.5 kg), or the limit that you are told, until your health care provider says that it is safe. Return to your normal activities as told by your health care provider. Ask your health care provider what activities are safe for you and when you can return to work. If you were given a sedative during the procedure, it can affect you for several hours. Do not drive or operate machinery until your health care provider says that it is safe. General instructions Take over-the-counter and prescription medicines only as told by your health care provider. If you will be going home right after the procedure, plan to have a responsible adult care for you for the time you are told. This is important. Keep all follow-up visits. This is important. Contact a health care provider if: You have a fever or chills. You have any of these signs of infection at your incision site: Redness, swelling, or pain. Fluid or blood. Warmth. Pus or a bad smell. Get help right away if: The incision area swells very fast. The incision area is bleeding, and the bleeding does not stop when you hold steady pressure on the area. Your arm or hand becomes pale, cool, tingly, or numb. These symptoms may represent a serious problem that is an emergency. Do not wait to see if the symptoms will go away. Get medical  help right away. Call your local emergency services (911 in the [place] Do not drive yourself to the hospital. Summary After the procedure, it is common to have bruising and tenderness at the incision site. Follow instructions from your health care provider about how to take care of your radial site incision. Check the incision every day for signs of infection. Do not lift anything that is heavier than 10 lb (4.5 kg), or the limit that you are told, until your health care provider says that it is  safe. Get help right away if the incision area swells very fast, you have bleeding at the incision site that will not stop, or your arm or hand becomes pale, cool, or numb. This information is not intended to replace advice given to you by your health care provider. Make sure you discuss any questions you have with your health care provider. Document Revised: 02/08/2020 Document Reviewed: 02/08/2020 Elsevier Patient Education  2024 [employer].

## 2023-07-03 NOTE — Discharge Summary (Signed)
 Discharge Summary for Same Day PCI   Patient ID: [name] MRN: [id number]; DOB: [birth year]  Admit date: 07/03/2023 Discharge date: 07/03/2023  Primary Care Provider: Gretta Comer POUR, NP  Primary Cardiologist: Oneil Parchment, MD  Primary Electrophysiologist:  OLE ONEIDA HOLTS, MD   Discharge Diagnoses    Active Problems:   Paroxysmal atrial fibrillation [facility])   Coronary artery disease of native heart with stable angina pectoris (HCC)   CAD (coronary artery disease) of artery bypass graft    Diagnostic Studies/Procedures    Cardiac Catheterization 07/03/2023:  Coronary and bypass graft angiography 07/03/2023: LM: No significant disease LAD: Prox-mid diffuse 70% disease, followed by mid vessel occlusion,  Ramus: Mid vessel occlusion, unprotected Lcx: Mid diffuse 75% disease RCA: Prox vessel occlusion LIMA-LAD: Patent, no significant disease SVG-PDA: Distal 90% stenosis SVG-ramus/diag: Known occluded, not engaged today   LVEDP 14 mmHg   Successful percutaneous coronary intervention Mid Lcx and SVG-RPDA       PTCA and stent placement 2.0 X 22 mm Onyx drug-eluting stent mid LCx, post dilated by 2.25 mm [place] balloon up to 16 atm        PTCA and stent placement 2.75 X 26 mm Onyx drug-eluting stent, SVG-RPDA post dilated by 2.75 mm [place] balloon up to 16 atm    _____________   History of Present Illness     [name] is a 78 y.o. male with a past medical history of CAD s/p CABG in 2016 (LIMA-LAD, SVG-PDA, and SVG -Ramus/Diag) s/p NSTEMI 2017 occul RCA graft medically managed, Cath 2021 stable with no intervention, AF s/p maze and LAA clipping during CABG, and ischemic cardiomyopathy (EF 35-40%) with ICD in situ who had been reporting worsening shortness of breath over several months. He underwent a NM PET CT on 05/29/2023 which showed evidence of ischemia in the RCA and Lcx territories. He was seen outpatient by EP on 06/29/2023 and Dr. HOLTS felt the patient's shortness of  breath was related to cardiac ischemia.    Cardiac catheterization was arranged for further evaluation.  Hospital Course     The patient underwent cardiac cath as noted above with PCTA and stent placement to the mid LCX and SVG-RPDA. Plan for antiplatelet monotherapy with Plavix  and anticoagulation with eliquis  given AF. The patient was seen by cardiac rehab while in short stay. There were no observed complications post cath. L radial cath site was re-evaluated prior to discharge and found to be stable without any complications. Instructions/precautions regarding cath site care were given prior to discharge.  [name] was seen by Dr. Elmira and determined stable for discharge home. Follow up with our office has been arranged. Medications are listed below. Pertinent changes include Start Plavix .    _____________  Cath/PCI Registry Performance & Quality Measures: Aspirin  prescribed? - No - Patient is on eliquis  ADP Receptor Inhibitor (Plavix /Clopidogrel , Brilinta /Ticagrelor  or Effient/Prasugrel) prescribed (includes medically managed patients)? - Yes High Intensity Statin (Lipitor 40-80mg  or Crestor  20-40mg ) prescribed? - No - Patient is on Repatha  For EF <40%, was ACEI/ARB prescribed? - Yes Patient is on entresto  For EF <40%, Aldosterone Antagonist (Spironolactone  or Eplerenone) prescribed? - No - Reason:  Cr mildly elevated today and received iodinated contrast. Would recommend re-evaluation at outpatient follow up Cardiac Rehab Phase II ordered (Included Medically managed Patients)? - Yes  _____________   Discharge Vitals Blood pressure (!) 126/55, pulse 77, temperature 98 F (36.7 C), temperature source Oral, resp. rate 20, height 5' 8 (1.727 m), weight  90.7 kg, SpO2 95%.  Filed Weights   07/03/23 0830  Weight: 90.7 kg    Last Labs & Radiologic Studies    CBC No results for input(s): WBC, NEUTROABS, HGB, HCT, MCV, PLT in the last 72 hours. Basic Metabolic  Panel No results for input(s): NA, K, CL, CO2, GLUCOSE, BUN, CREATININE, CALCIUM , MG, PHOS in the last 72 hours. Liver Function Tests No results for input(s): AST, ALT, ALKPHOS, BILITOT, PROT, ALBUMIN  in the last 72 hours. No results for input(s): LIPASE, AMYLASE in the last 72 hours. High Sensitivity Troponin:   Recent Labs  Lab 06/24/23 1137 06/24/23 1510  TROPONINIHS 18* 16    BNP Invalid input(s): POCBNP D-Dimer No results for input(s): DDIMER in the last 72 hours. Hemoglobin A1C No results for input(s): HGBA1C in the last 72 hours. Fasting Lipid Panel No results for input(s): CHOL, HDL, LDLCALC, TRIG, CHOLHDL, LDLDIRECT in the last 72 hours. Thyroid  Function Tests No results for input(s): TSH, T4TOTAL, T3FREE, THYROIDAB in the last 72 hours.  Invalid input(s): FREET3 _____________  CARDIAC CATHETERIZATION Result Date: 07/03/2023 Images from the original result were not included. Coronary and bypass graft angiography 07/03/2023: LM: No significant disease LAD: Prox-mid diffuse 70% disease, followed by mid vessel occlusion, Ramus: Mid vessel occlusion, unprotected Lcx: Mid diffuse 75% disease RCA: Prox vessel occlusion LIMA-LAD: Patent, no significant disease SVG-PDA: Distal 90% stenosis SVG-ramus/diag: Known occluded, not engaged today LVEDP 14 mmHg Successful percutaneous coronary intervention Mid Lcx and SVG-RPDA        PTCA and stent placement 2.0 X 22 mm Onyx drug-eluting stent mid LCx, post dilated by 2.25 mm [place] balloon up to 16 atm        PTCA and stent placement 2.75 X 26 mm Onyx drug-eluting stent, SVG-RPDA post dilated by 2.75 mm  balloon up to 16 atm [name] JINNY Lawrence, MD  CT Angio Chest PE W and/or Wo Contrast Result Date: 06/24/2023 CLINICAL DATA:  Acute onset shortness of breath and cough. Recently treated for pneumonia. EXAM: CT ANGIOGRAPHY CHEST WITH CONTRAST TECHNIQUE: Multidetector CT imaging of the  chest was performed using the standard protocol during bolus administration of intravenous contrast. Multiplanar CT image reconstructions and MIPs were obtained to evaluate the vascular anatomy. RADIATION DOSE REDUCTION: This exam was performed according to the departmental dose-optimization program which includes automated exposure control, adjustment of the mA and/or kV according to patient size and/or use of iterative reconstruction technique. CONTRAST:  75mL OMNIPAQUE  IOHEXOL  350 MG/ML SOLN COMPARISON:  Same day chest radiograph FINDINGS: Cardiovascular: Left chest wall ICD lead terminates in the right ventricle. Left atrial appendage clip. The study is high quality for the evaluation of pulmonary embolism. There are no filling defects in the central, lobar, segmental or subsegmental pulmonary artery branches to suggest acute pulmonary embolism. Dilated main pulmonary artery measures 4.2 cm. Normal heart size. No significant pericardial fluid/thickening. Coronary artery calcifications and aortic atherosclerosis. Mediastinum/Nodes: Imaged thyroid  gland without nodules meeting criteria for imaging follow-up by size. Normal esophagus. No pathologically enlarged axillary, supraclavicular, mediastinal, or hilar lymph nodes. Lungs/Pleura: The central airways are patent. Mild diffuse bronchial thickening. No focal consolidation. No pneumothorax. No pleural effusion. Upper abdomen: Cholecystectomy. Musculoskeletal: No acute or abnormal lytic or blastic osseous lesions. Median sternotomy wires are nondisplaced. Multilevel degenerative changes of the thoracic spine. Review of the MIP images confirms the above findings. IMPRESSION: 1. No evidence of pulmonary embolism. 2. Mild diffuse bronchial thickening, which can be seen in the setting of bronchitis. 3. Dilated main  pulmonary artery, which can be seen in the setting of pulmonary hypertension. 4. Aortic Atherosclerosis (ICD10-I70.0). Coronary artery calcifications.  Assessment for potential risk factor modification, dietary therapy or pharmacologic therapy may be warranted, if clinically indicated. Electronically Signed   By: Limin  Xu M.D.   On: 06/24/2023 14:02   DG Chest 2 View Result Date: 06/24/2023 CLINICAL DATA:  sob EXAM: CHEST - 2 VIEW COMPARISON:  01/07/2019. FINDINGS: Cardiac silhouette enlarged. No evidence of pneumothorax or pleural effusion. No evidence of pulmonary edema. Calcified aorta. No osseous abnormalities identified. There are median sternotomy wires and prior CABG. There is a left atrial clip. Left-sided pacer. IMPRESSION: Enlarged cardiac silhouette.  Lungs are clear. Electronically Signed   By: Fonda Field M.D.   On: 06/24/2023 11:29    Disposition   Pt is being discharged home today in good condition.  Follow-up Plans & Appointments     Discharge Instructions     Amb Referral to Cardiac Rehabilitation   Complete by: As directed    Diagnosis: Coronary Stents   After initial evaluation and assessments completed: Virtual Based Care may be provided alone or in conjunction with Phase 2 Cardiac Rehab based on patient barriers.: Yes   Intensive Cardiac Rehabilitation (ICR) [facility] location only OR Traditional Cardiac Rehabilitation (TCR) *If criteria for ICR are not met will enroll in TCR [facility] only): Yes        Discharge Medications   Allergies as of 07/03/2023       Reactions   Lipitor [atorvastatin ] Other (See Comments)   Myopathy Spring 2006   Pravastatin  Other (See Comments)   LEG CRAMPS   Simvastatin  Other (See Comments)   LEG CRAMPS        Medication List     TAKE these medications    albuterol  108 (90 Base) MCG/ACT inhaler Commonly known as: VENTOLIN  HFA Inhale 2 puffs into the lungs every 6 (six) hours as needed for wheezing or shortness of breath.   carvedilol  6.25 MG tablet Commonly known as: COREG  Take 1 tablet (6.25 mg total) by mouth 2 (two) times daily with a meal.   clopidogrel  75 MG  tablet Commonly known as: Plavix  Take 1 tablet (75 mg total) by mouth daily.   dapagliflozin  propanediol 10 MG Tabs tablet Commonly known as: FARXIGA  Take 1 tablet (10 mg total) by mouth daily before breakfast. For diabetes.   Eliquis  5 MG Tabs tablet Generic drug: apixaban  TAKE 1 TABLET BY MOUTH TWICE A DAY   Entresto  24-26 MG Generic drug: sacubitril-valsartan TAKE 1 TABLET BY MOUTH TWICE A DAY   FreeStyle Libre 3 Sensor Misc Place 1 sensor on the skin every 14 days. Use to check glucose continuously   Insulin  Pen Needle 31G X 8 MM Misc Use as directed with Lantus .   isosorbide  mononitrate 30 MG 24 hr tablet Commonly known as: IMDUR  TAKE 1 TABLET BY MOUTH EVERY DAY   nitroGLYCERIN  0.4 MG SL tablet Commonly known as: NITROSTAT  Place 1 tablet (0.4 mg total) under the tongue every 5 (five) minutes as needed for chest pain.   NovoLOG  FlexPen 100 UNIT/ML FlexPen Generic drug: insulin  aspart Inject 20 Units into the skin 3 (three) times daily with meals.   ondansetron  4 MG disintegrating tablet Commonly known as: ZOFRAN -ODT Take 1 tablet (4 mg total) by mouth every 8 (eight) hours as needed for nausea or vomiting.   OVER THE COUNTER MEDICATION Place 1 drop into both eyes 2 (two) times daily. Happy body  liquid msm eye  drops/ for floaters in the eyes   Repatha  SureClick 140 MG/ML Soaj Generic drug: Evolocumab  Inject 140 mg into the skin every 14 (fourteen) days.   rosuvastatin  10 MG tablet Commonly known as: CRESTOR  Take 1 tablet (10 mg total) by mouth daily.   Semaglutide  (1 MG/DOSE) 4 MG/3ML Sopn Inject 1 mg as directed once a week. for diabetes.   Spacer/Aero-Hold Chamber Bags Misc 1 Bag by Does not apply route as needed.   tamsulosin  0.4 MG Caps capsule Commonly known as: FLOMAX  TAKE 1 CAPSULE (0.4 MG TOTAL) BY MOUTH DAILY. FOR URINE FLOW.   Tresiba  FlexTouch 200 UNIT/ML FlexTouch Pen Generic drug: insulin  degludec Inject 100 Units into the skin daily. Via  Novo Cares PAP           Allergies Allergies  Allergen Reactions   Lipitor [Atorvastatin ] Other (See Comments)    Myopathy Spring 2006   Pravastatin  Other (See Comments)    LEG CRAMPS   Simvastatin  Other (See Comments)    LEG CRAMPS    Outstanding Labs/Studies   None  Duration of Discharge Encounter   Greater than 30 minutes including physician time.  Signed, Leontine LOISE Salen, PA-C 07/03/2023, 3:03 PM

## 2023-07-09 ENCOUNTER — Ambulatory Visit (INDEPENDENT_AMBULATORY_CARE_PROVIDER_SITE_OTHER): Payer: Medicare Other

## 2023-07-09 ENCOUNTER — Encounter: Payer: Self-pay | Admitting: *Deleted

## 2023-07-09 DIAGNOSIS — I255 Ischemic cardiomyopathy: Secondary | ICD-10-CM | POA: Diagnosis not present

## 2023-07-09 NOTE — Progress Notes (Unsigned)
 OFFICE NOTE:    Date:  07/10/2023  ID:  [name], DOB Jun 23, 1945, MRN [id number] PCP: Gretta Comer POUR, NP  [facility] Providers Cardiologist:  Oneil Parchment, MD Cardiology APP:  Lelon Glendia DASEN, PA-C  Electrophysiologist:  OLE DASEN HOLTS, MD        Coronary artery disease  S/p CABG + Maze procedure + LAA clipping 06/2014 NSTEMI 08/2015 - LHC w S-RCA 100 >> med Rx LHC 01/06/19: L-LAD patent, S-RI/D1 100 CTO, S-RPDA/RPL patent and diffusely dz'd w 90 before S-RPDA anastomosis >> Med Rx PET MPI 05/29/23: inf scar w peri-infarct ischemia, lat ischemia; global MBFR abnormal (1.52), high risk  S/p 2 x 22 mm DES to mLCx and 2.5 x 26 mm DES to S-RPDA  LHC 07/03/23: pLAD 70, mLAD 100; RI 100; mLCx 75; pRCA 100; L-LAD patent, S-PDA dist 90, S-RI/Dx old 100 (HFrEF) heart failure with reduced ejection fraction Ischemic CM TEE 06/22/14: EF 40-45 TTE 01/10/16: EF 50-55, inf AK TTE 02/13/22: EF 25-30, global HK, mod pulm HTN, RVSP 48.2, mild LAE, mild MR, mild to mod TR, asc aorta 40 mm, RAP 8 TTE 03/15/2023: EF 35-40, global HK, moderately reduced RVSF, moderate BAE, trivial MR, ascending aorta 38 mm, RAP 3  S/p AICD 01/2019  Paroxysmal atrial fibrillation  S/p DCCV in 02/2022 Hypertension  Hyperlipidemia   Intol of Zetia  (myalgias); hesitant to take PCSK9i Diabetes mellitus  Carotid stenosis US  06/19/14: Bilat ICA 1-39        Discussed the use of AI scribe software for clinical note transcription with the patient, who gave verbal consent to proceed. History of Present Illness [name] is a 78 y.o. male returns for posthospitalization follow-up.  He was seen in February 2025 with increased shortness of breath.  Echocardiogram demonstrated slightly improved LV function with EF 35-40.  PET MPI was arranged and demonstrated inferior scar with peri-infarct ischemia as well as lateral ischemia.  This was reviewed with Dr. Parchment.  It was felt that his stress test findings could  represent his known anatomy.  As long as he was stable, we thought he could be managed medically.  I contacted him and he was stable without significant shortness of breath.  He then had a trip to the emergency room with increased shortness of breath.  He was placed on antibiotics for bronchitis.  He saw Dr. HOLTS 06/29/2023 and noted worsening shortness of breath.  He was set up for cardiac catheterization which demonstrated significant disease in the mid LCx as well as high-grade disease in the vein graft to the RPDA.  He underwent PCI with DES to mid LCx and DES to the vein graft to RPDA.  He is here with his wife. Since his PCI, he has not had chest pain, significant shortness of breath. He has a history of bronchitis and is currently experiencing congestion with yellow mucus, making it difficult to breathe through his nose at times. He was prescribed prednisone  and several rounds of antibiotics, and he uses an albuterol  inhaler. He has not had leg edema, orthopnea.     Review of Systems  HENT:  Positive for congestion.   Respiratory:  Positive for cough.   -See HPI    EKG Interpretation Date/Time:  Tuesday July 10 2023 14:45:16 EDT Ventricular Rate:  103 PR Interval:    QRS Duration:  94 QT Interval:  368 QTC Calculation: 482 R Axis:   104  Text Interpretation: Atrial fibrillation with rapid ventricular response  with premature ventricular or aberrantly conducted complexes Rightward axis ST & T wave abnormality, consider inferior ischemia Confirmed by Lelon Hamilton [phone]) on 07/10/2023 3:29:58 PM   Risk Assessment/Calculations:  CHA2DS2-VASc Score = 6   This indicates a 9.7% annual risk of stroke. The patient's score is based upon: CHF History: 1 HTN History: 1 Diabetes History: 1 Stroke History: 0 Vascular Disease History: 1 Age Score: 2 Gender Score: 0           Physical Exam:  VS:  BP (!) 117/50   Pulse (!) 103   Ht 5' 8 (1.727 m)   Wt 205 lb (93 kg)   SpO2 98%   BMI  31.17 kg/m        Wt Readings from Last 3 Encounters:  07/10/23 205 lb (93 kg)  07/03/23 200 lb (90.7 kg)  06/29/23 200 lb (90.7 kg)    Constitutional:      Appearance: Healthy appearance. Not in distress.  Neck:     Vascular: No JVR. JVD normal.  Pulmonary:     Breath sounds: Bilateral Wheezing (expiratory) present. No rales.  Cardiovascular:     Tachycardia present. Irregularly irregular rhythm.     Murmurs: There is no murmur.     Comments: L wrist w/o hematoma Edema:    Peripheral edema absent.  Abdominal:     Palpations: Abdomen is soft.  Skin:    General: Skin is warm and dry.       Assessment and Plan:    Assessment & Plan Coronary artery disease involving native coronary artery of native heart without angina pectoris S/p CABG in 2016.  He had a non-STEMI in August 2017 with an occluded vein graft to the RCA treated medically.  Cardiac catheterization in January 2021 demonstrated an occluded vein graft to the ramus intermedius and diagonal, high-grade disease in the distal vein graft to RPDA and patent LIMA to the LAD.  Medical therapy was continued at that time.  He was recently seen for shortness of breath and PET MPI was abnormal with inferior scar with peri-infarct ischemia and lateral ischemia.  His symptoms were stable at that time and we thought his stress test abnormalities were likely explained by his known anatomy and medical therapy was continued with planned follow-up.  In the interim, he developed worsening shortness of breath and was seen by Dr. Cindie and set up for cardiac catheterization.  He subsequently underwent DES to the mid LCx and DES to the vein graft to the RPDA.  He is doing well without chest discomfort to suggest angina.  His breathing has improved.  He still has some congestion and wheezing related to recent history of bronchitis which has been slow to resolve. - Continue Plavix  75 mg daily, Eliquis  5 mg twice daily, Repatha  140 mg every 2 weeks,  nitroglycerin  as needed, rosuvastatin  10 mg daily HFrEF (heart failure with reduced ejection fraction) (HCC) EF 35-40 by echocardiogram March 2025.  Ischemic cardiomyopathy.  NYHA II-IIb.  Volume status overall stable.   - Continue Farxiga  10 mg daily, Entresto  24/26 mg twice daily.   - Stop carvedilol  as noted below and start Toprol -XL 50 mg twice daily.   - Obtain follow-up BMET.  - Consider addition of MRA. Paroxysmal atrial fibrillation (HCC) He is in atrial fibrillation with rapid rate. It is not clear if he has been in continuous atrial fibrillation based on device interrogation today. He has a hx of Maze procedure at the time of his CABG. He  is currently asymptomatic in atrial fibrillation. Carvedilol  may be contributing to wheezing. I am hesitant to increase it for rate control as this could contribute to worsening wheezing.   - Discontinue carvedilol  - Start metoprolol  succinate 50 mg twice daily for rate control  - Arrange 14 day Zio XT to assess atrial fibrillation burden  - Plan follow up in 3-4 weeks - If monitor shows AFib burden 100%, will arrange cardioversion   - If he is not in AFib 100% of the time but burden is high, will need to review with EP regarding AAD Rx vs PVI ablation. - Continue Eliquis  5 mg twice daily ICD (implantable cardioverter-defibrillator) in place Follow up with EP as planned.  Pure hypercholesterolemia Goal LDL to less than 55. He has intolerance to most statins and Zetia . - Continue Repatha  140 mg every 14 days, Crestor  10 mg once daily  - Will need to arrange follow up Lipids at next visit  Primary hypertension Blood pressure controlled with current medication regimen. Concern for potential hypotension with change in beta blocker. - Stop isosorbide  to avoid hypotension - Change carvedilol  to metoprolol  succinate 50 mg twice daily - Continue Entresto  24/26 mg twice daily Bronchitis He has been seen a few times for bronchitis. He notes he has  completed 2 rounds of antibiotics and has completed a round of prednisone . He is still having some congestion. He is wheezing on exam as well. Referral to pulmonology pending. I advised him to use his Albuterol  inhaler and get some Mucinex  OTC. I have asked him to follow up with follow up with primary care while he is waiting to get into pulmonology.      Dispo:  Return in about 4 weeks (around 08/07/2023) for Routine Follow Up, w/ Dr. Jeffrie, or Glendia Ferrier, PA-C.  Signed, Glendia Ferrier, PA-C

## 2023-07-10 ENCOUNTER — Ambulatory Visit: Attending: Physician Assistant | Admitting: Physician Assistant

## 2023-07-10 ENCOUNTER — Encounter: Payer: Self-pay | Admitting: Physician Assistant

## 2023-07-10 ENCOUNTER — Ambulatory Visit

## 2023-07-10 VITALS — BP 117/50 | HR 103 | Ht 68.0 in | Wt 205.0 lb

## 2023-07-10 DIAGNOSIS — I10 Essential (primary) hypertension: Secondary | ICD-10-CM

## 2023-07-10 DIAGNOSIS — I480 Paroxysmal atrial fibrillation: Secondary | ICD-10-CM

## 2023-07-10 DIAGNOSIS — E7800 Pure hypercholesterolemia, unspecified: Secondary | ICD-10-CM

## 2023-07-10 DIAGNOSIS — I5020 Unspecified systolic (congestive) heart failure: Secondary | ICD-10-CM

## 2023-07-10 DIAGNOSIS — I2510 Atherosclerotic heart disease of native coronary artery without angina pectoris: Secondary | ICD-10-CM | POA: Diagnosis not present

## 2023-07-10 DIAGNOSIS — Z95810 Presence of automatic (implantable) cardiac defibrillator: Secondary | ICD-10-CM

## 2023-07-10 DIAGNOSIS — J40 Bronchitis, not specified as acute or chronic: Secondary | ICD-10-CM

## 2023-07-10 LAB — CUP PACEART REMOTE DEVICE CHECK
Battery Remaining Longevity: 96 mo
Battery Voltage: 2.99 V
Brady Statistic RV Percent Paced: 0 %
Date Time Interrogation Session: 20250707022824
HighPow Impedance: 74 Ohm
Implantable Lead Connection Status: 753985
Implantable Lead Implant Date: 20210105
Implantable Lead Location: 753860
Implantable Pulse Generator Implant Date: 20210105
Lead Channel Impedance Value: 399 Ohm
Lead Channel Impedance Value: 456 Ohm
Lead Channel Pacing Threshold Amplitude: 0.5 V
Lead Channel Pacing Threshold Pulse Width: 0.4 ms
Lead Channel Sensing Intrinsic Amplitude: 10.25 mV
Lead Channel Sensing Intrinsic Amplitude: 10.25 mV
Lead Channel Setting Pacing Amplitude: 2.5 V
Lead Channel Setting Pacing Pulse Width: 0.4 ms
Lead Channel Setting Sensing Sensitivity: 0.3 mV
Zone Setting Status: 755011
Zone Setting Status: 755011

## 2023-07-10 MED ORDER — METOPROLOL SUCCINATE ER 50 MG PO TB24: 50.0000 mg | ORAL_TABLET | Freq: Two times a day (BID) | ORAL | 3 refills | Status: AC

## 2023-07-10 MED ORDER — METOPROLOL SUCCINATE ER 50 MG PO TB24: 50.0000 mg | ORAL_TABLET | Freq: Two times a day (BID) | ORAL | 11 refills | Status: DC

## 2023-07-10 NOTE — Assessment & Plan Note (Signed)
 S/p CABG in 2016.  He had a non-STEMI in August 2017 with an occluded vein graft to the RCA treated medically.  Cardiac catheterization in January 2021 demonstrated an occluded vein graft to the ramus intermedius and diagonal, high-grade disease in the distal vein graft to RPDA and patent LIMA to the LAD.  Medical therapy was continued at that time.  He was recently seen for shortness of breath and PET MPI was abnormal with inferior scar with peri-infarct ischemia and lateral ischemia.  His symptoms were stable at that time and we thought his stress test abnormalities were likely explained by his known anatomy and medical therapy was continued with planned follow-up.  In the interim, he developed worsening shortness of breath and was seen by Dr. Cindie and set up for cardiac catheterization.  He subsequently underwent DES to the mid LCx and DES to the vein graft to the RPDA.  He is doing well without chest discomfort to suggest angina.  His breathing has improved.  He still has some congestion and wheezing related to recent history of bronchitis which has been slow to resolve. - Continue Plavix  75 mg daily, Eliquis  5 mg twice daily, Repatha  140 mg every 2 weeks, nitroglycerin  as needed, rosuvastatin  10 mg daily

## 2023-07-10 NOTE — Assessment & Plan Note (Signed)
 He is in atrial fibrillation with rapid rate. It is not clear if he has been in continuous atrial fibrillation based on device interrogation today. He has a hx of Maze procedure at the time of his CABG. He is currently asymptomatic in atrial fibrillation. Carvedilol  may be contributing to wheezing. I am hesitant to increase it for rate control as this could contribute to worsening wheezing.   - Discontinue carvedilol  - Start metoprolol  succinate 50 mg twice daily for rate control  - Arrange 14 day Zio XT to assess atrial fibrillation burden  - Plan follow up in 3-4 weeks - If monitor shows AFib burden 100%, will arrange cardioversion   - If he is not in AFib 100% of the time but burden is high, will need to review with EP regarding AAD Rx vs PVI ablation. - Continue Eliquis  5 mg twice daily

## 2023-07-10 NOTE — Assessment & Plan Note (Signed)
 Blood pressure controlled with current medication regimen. Concern for potential hypotension with change in beta blocker. - Stop isosorbide  to avoid hypotension - Change carvedilol  to metoprolol  succinate 50 mg twice daily - Continue Entresto  24/26 mg twice daily

## 2023-07-10 NOTE — Assessment & Plan Note (Signed)
 Goal LDL to less than 55. He has intolerance to most statins and Zetia . - Continue Repatha  140 mg every 14 days, Crestor  10 mg once daily  - Will need to arrange follow up Lipids at next visit

## 2023-07-10 NOTE — Patient Instructions (Addendum)
 Medication Instructions:  Your physician has recommended you make the following change in your medication:  1.  START Metoprolol  50 mg taking 1 twice a day  2.  STOP Carvedilol  3.  STOP Imdur  (Isosorbide )   *If you need a refill on your cardiac medications before your next appointment, please call your pharmacy*  Lab Work: TODAY:  BMET & CBC  If you have labs (blood work) drawn today and your tests are completely normal, you will receive your results only by: MyChart Message (if you have MyChart) OR A paper copy in the mail If you have any lab test that is abnormal or we need to change your treatment, we will call you to review the results.  Testing/Procedures: GEOFFRY HEWS- Long Term Monitor Instructions  Your physician has requested you wear a ZIO patch monitor for 14 days.  This is a single patch monitor. [employer] supplies one patch monitor per enrollment. Additional stickers are not available. Please do not apply patch if you will be having a Nuclear Stress Test,  Echocardiogram, Cardiac CT, MRI, or Chest Xray during the period you would be wearing the  monitor. The patch cannot be worn during these tests. You cannot remove and re-apply the  ZIO XT patch monitor.  Your ZIO patch monitor will be mailed 3 day USPS to your address on file. It may take 3-5 days  to receive your monitor after you have been enrolled.  Once you have received your monitor, please review the enclosed instructions. Your monitor  has already been registered assigning a specific monitor serial # to you.  Billing and Patient Assistance Program Information  We have supplied [employer] with any of your insurance information on file for billing purposes. [employer] offers a sliding scale Patient Assistance Program for patients that do not have  insurance, or whose insurance does not completely cover the cost of the ZIO monitor.  You must apply for the Patient Assistance Program to qualify for this discounted rate.  To  apply, please call [employer] at [phone], select option 4, select option 2, ask to apply for  Patient Assistance Program. Meredeth will ask your household income, and how many people  are in your household. They will quote your out-of-pocket cost based on that information.  [employer] will also be able to set up a 9-month, interest-free payment plan if needed.  Applying the monitor   Shave hair from upper left chest.  Hold abrader disc by orange tab. Rub abrader in 40 strokes over the upper left chest as  indicated in your monitor instructions.  Clean area with 4 enclosed alcohol pads. Let dry.  Apply patch as indicated in monitor instructions. Patch will be placed under collarbone on left  side of chest with arrow pointing upward.  Rub patch adhesive wings for 2 minutes. Remove white label marked 1. Remove the white  label marked 2. Rub patch adhesive wings for 2 additional minutes.  While looking in a mirror, press and release button in center of patch. A small green light will  flash 3-4 times. This will be your only indicator that the monitor has been turned on.  Do not shower for the first 24 hours. You may shower after the first 24 hours.  Press the button if you feel a symptom. You will hear a small click. Record Date, Time and  Symptom in the Patient Logbook.  When you are ready to remove the patch, follow instructions on the last 2 pages of Patient  Logbook.  Stick patch monitor onto the last page of Patient Logbook.  Place Patient Logbook in the blue and white box. Use locking tab on box and tape box closed  securely. The blue and white box has prepaid postage on it. Please place it in the mailbox as  soon as possible. Your physician should have your test results approximately 7 days after the  monitor has been mailed back to [facility].  Call [facility] Customer Care at [phone] if you have questions regarding  your ZIO XT patch monitor. Call them immediately if  you see an orange light blinking on your  monitor.  If your monitor falls off in less than 4 days, contact our Monitor department at [phone].  If your monitor becomes loose or falls off after 4 days call [employer] at [phone] for  suggestions on securing your monitor   Follow-Up: At [facility], you and your health needs are our priority.  As part of our continuing mission to provide you with exceptional heart care, our providers are all part of one team.  This team includes your primary Cardiologist (physician) and Advanced Practice Providers or APPs (Physician Assistants and Nurse Practitioners) who all work together to provide you with the care you need, when you need it.  Your next appointment:   3 week(s)  Provider:   Oneil Parchment, MD or Glendia Ferrier, PA-C          We recommend signing up for the patient portal called MyChart.  Sign up information is provided on this After Visit Summary.  MyChart is used to connect with patients for Virtual Visits (Telemedicine).  Patients are able to view lab/test results, encounter notes, upcoming appointments, etc.  Non-urgent messages can be sent to your provider as well.   To learn more about what you can do with MyChart, go to [email or web address].   Other Instructions

## 2023-07-10 NOTE — Assessment & Plan Note (Signed)
 EF 35-40 by echocardiogram March 2025.  Ischemic cardiomyopathy.  NYHA II-IIb.  Volume status overall stable.   - Continue Farxiga  10 mg daily, Entresto  24/26 mg twice daily.   - Stop carvedilol  as noted below and start Toprol -XL 50 mg twice daily.   - Obtain follow-up BMET.  - Consider addition of MRA.

## 2023-07-10 NOTE — Assessment & Plan Note (Signed)
Follow up with EP as planned. 

## 2023-07-10 NOTE — Progress Notes (Unsigned)
 Enrolled patient for a 14 day Zio XT monitor to be mailed to patients home  [place]ains to read

## 2023-07-11 ENCOUNTER — Ambulatory Visit: Payer: Self-pay | Admitting: Physician Assistant

## 2023-07-11 LAB — CBC
Hematocrit: 48.4 % (ref 37.5–51.0)
Hemoglobin: 15.8 g/dL (ref 13.0–17.7)
MCH: 30.5 pg (ref 26.6–33.0)
MCHC: 32.6 g/dL (ref 31.5–35.7)
MCV: 93 fL (ref 79–97)
Platelets: 234 x10E3/uL (ref 150–450)
RBC: 5.18 x10E6/uL (ref 4.14–5.80)
RDW: 14.1 % (ref 11.6–15.4)
WBC: 10.4 x10E3/uL (ref 3.4–10.8)

## 2023-07-11 LAB — BASIC METABOLIC PANEL WITH GFR
BUN/Creatinine Ratio: 19 (ref 10–24)
BUN: 19 mg/dL (ref 8–27)
CO2: 18 mmol/L — AB (ref 20–29)
Calcium: 9 mg/dL (ref 8.6–10.2)
Chloride: 104 mmol/L (ref 96–106)
Creatinine, Ser: 1.01 mg/dL (ref 0.76–1.27)
Glucose: 157 mg/dL — AB (ref 70–99)
Potassium: 4.1 mmol/L (ref 3.5–5.2)
Sodium: 144 mmol/L (ref 134–144)
eGFR: 76 mL/min/1.73 (ref >59)

## 2023-07-14 ENCOUNTER — Ambulatory Visit: Payer: Self-pay | Admitting: Cardiology

## 2023-08-03 NOTE — Progress Notes (Signed)
 "     OFFICE NOTE:    Date:  08/07/2023  ID:  [name], DOB October 27, 1945, MRN [id number] PCP: Gretta Comer POUR, NP  [facility] Providers Cardiologist:  Oneil Parchment, MD Cardiology APP:  Lelon Glendia DASEN, PA-C  Electrophysiologist:  OLE DASEN HOLTS, MD        Coronary artery disease  S/p CABG + Maze procedure + LAA clipping 06/2014 NSTEMI 08/2015 - LHC w S-RCA 100 >> med Rx LHC 01/06/19: L-LAD patent, S-RI/D1 100 CTO, S-RPDA/RPL patent and diffusely dz'd w 90 before S-RPDA anastomosis >> Med Rx PET MPI 05/29/23: inf scar w peri-infarct ischemia, lat ischemia; global MBFR abnormal (1.52), high risk  S/p 2 x 22 mm DES to mLCx and 2.5 x 26 mm DES to S-RPDA  LHC 07/03/23: pLAD 70, mLAD 100; RI 100; mLCx 75; pRCA 100; L-LAD patent, S-PDA dist 90, S-RI/Dx old 100 (HFrEF) heart failure with reduced ejection fraction Ischemic CM TEE 06/22/14: EF 40-45 TTE 01/10/16: EF 50-55, inf AK TTE 02/13/22: EF 25-30, global HK, mod pulm HTN, RVSP 48.2, mild LAE, mild MR, mild to mod TR, asc aorta 40 mm, RAP 8 TTE 03/15/2023: EF 35-40, global HK, moderately reduced RVSF, moderate BAE, trivial MR, ascending aorta 38 mm, RAP 3  S/p AICD 01/2019  Paroxysmal atrial fibrillation  S/p DCCV in 02/2022 Hypertension  Hyperlipidemia   Intol of Zetia  (myalgias); hesitant to take PCSK9i Diabetes mellitus  Carotid stenosis US  06/19/14: Bilat ICA 1-39         Discussed the use of AI scribe software for clinical note transcription with the patient, who gave verbal consent to proceed. History of Present Illness [name] is a 78 y.o. male  He was last seen 07/10/23 for post hospital follow up after he underwent PCI with DES to mLCx and DES to S-RPDA. At that time, he had already been on several rounds of antibiotics for bronchitis. He was in atrial fibrillation at that time. We could not tell from his device interrogation how long he had been in atrial fibrillation.   He experiences bilateral leg pain extending  from his hips to his feet, making walking difficult. The pain affects both sides.  His breathing has improved significantly.  No chest discomfort, syncope, bleeding, melena, or hematuria. He recently played eighteen holes of golf without difficulty in breathing.    ROS-See HPI    Studies Reviewed:  EKG Interpretation Date/Time:  Tuesday August 07 2023 08:47:59 EDT Ventricular Rate:  70 PR Interval:  186 QRS Duration:  94 QT Interval:  428 QTC Calculation: 462 R Axis:   105  Text Interpretation: Normal sinus rhythm Rightward axis Nonspecific T wave abnormality Prolonged QT Confirmed by Lelon Glendia [phone]) on 08/07/2023 9:13:46 AM    Risk Assessment/Calculations:  CHA2DS2-VASc Score = 6   This indicates a 9.7% annual risk of stroke. The patient's score is based upon: CHF History: 1 HTN History: 1 Diabetes History: 1 Stroke History: 0 Vascular Disease History: 1 Age Score: 2 Gender Score: 0           Physical Exam:  VS:  BP 114/70   Pulse 70   Ht 5' 8 (1.727 m)   Wt 210 lb (95.3 kg)   SpO2 98%   BMI 31.93 kg/m        Wt Readings from Last 3 Encounters:  08/07/23 210 lb (95.3 kg)  07/10/23 205 lb (93 kg)  07/03/23 200 lb (90.7 kg)    Constitutional:  Appearance: Healthy appearance. Not in distress.  Neck:     Vascular: JVD normal.  Pulmonary:     Breath sounds: Normal breath sounds. No wheezing. No rales.  Cardiovascular:     Normal rate. Regular rhythm.     Murmurs: There is no murmur.     Comments: DP/PT 1+ bilat; No FA or abd bruits Edema:    Peripheral edema absent.  Abdominal:     Palpations: Abdomen is soft.       Assessment and Plan:    Assessment & Plan Paroxysmal atrial fibrillation [facility]) He has a hx of Maze procedure at the time of his CABG. At last visit I stopped carvedilol  and started metoprolol  succinate 50 mg twice daily for rate control. Follow up monitor is pending. He is back in NSR today. Will see what his AFib burden is on the  monitor report. - Continue Eliquis  5 mg twice daily, Metoprolol  succinate 50 mg twice daily  Coronary artery disease involving native coronary artery of native heart without angina pectoris S/p CABG in 2016.  He had a non-STEMI in August 2017 with an occluded vein graft to the RCA treated medically.  Cardiac catheterization in January 2021 demonstrated an occluded vein graft to the ramus intermedius and diagonal, high-grade disease in the distal vein graft to RPDA and patent LIMA to the LAD.  Medical therapy was continued at that time.  He recently had worsening anginal symptoms and a PET MPI was abnormal with inferior scar with peri-infarct ischemia and lateral ischemia.  He subsequently underwent DES to the mid LCx and DES to the vein graft to the RPDA.  He is doing well w/o chest pain to suggest angina. His breathing has also improved significantly.  - Continue Clopidogrel  75 mg once daily, Repatha  140 mg every 2 weeks, Crestor  10 mg once daily - Follow up 6 mos HFrEF (heart failure with reduced ejection fraction) (HCC) EF 35-40 by echocardiogram March 2025.  Ischemic cardiomyopathy.  NYHA II. Volume status stable.  - Continue Entresto  24/26 mg twice daily, Metoprolol  succinate 50 mg twice daily, Farxiga  10 mg once daily  Pure hypercholesterolemia Goal LDL to less than 55. He has intolerance to most statins and Zetia . We need to arrange follow up Lipids. - Continue Repatha  140 mg every 2 weeks, Crestor  10 mg once daily  - Arrange Lipids, ALT Claudication (HCC) He has exertional bilateral buttock and leg pain.  - Will get ABIs/arterial dopplers to r/o peripheral arterial disease.       Dispo:  Return in about 6 months (around 02/07/2024) for Routine Follow Up, w/ Dr. Jeffrie.  Signed, Glendia Ferrier, PA-C   "

## 2023-08-06 NOTE — Assessment & Plan Note (Signed)
 He has a hx of Maze procedure at the time of his CABG. At last visit I stopped carvedilol  and started metoprolol  succinate 50 mg twice daily for rate control. Follow up monitor showed ***

## 2023-08-06 NOTE — Assessment & Plan Note (Signed)
 Goal LDL to less than 55. He has intolerance to most statins and Zetia . We need to arrange follow up Lipids.***

## 2023-08-06 NOTE — Assessment & Plan Note (Signed)
 S/p CABG in 2016.  He had a non-STEMI in August 2017 with an occluded vein graft to the RCA treated medically.  Cardiac catheterization in January 2021 demonstrated an occluded vein graft to the ramus intermedius and diagonal, high-grade disease in the distal vein graft to RPDA and patent LIMA to the LAD.  Medical therapy was continued at that time.  He recently had worsening anginal symptoms and a PET MPI was abnormal with inferior scar with peri-infarct ischemia and lateral ischemia.  He subsequently underwent DES to the mid LCx and DES to the vein graft to the RPDA.  He is doing well w/o chest pain to suggest angina. His breathing has also improved significantly.  - Continue Clopidogrel  75 mg once daily, Repatha  140 mg every 2 weeks, Crestor  10 mg once daily - Follow up 6 mos

## 2023-08-06 NOTE — Assessment & Plan Note (Signed)
 EF 35-40 by echocardiogram March 2025.  Ischemic cardiomyopathy.  NYHA II. Volume status stable.  - Continue Entresto  24/26 mg twice daily, Metoprolol  succinate 50 mg twice daily, Farxiga  10 mg once daily

## 2023-08-07 ENCOUNTER — Ambulatory Visit: Attending: Cardiovascular Disease | Admitting: Physician Assistant

## 2023-08-07 ENCOUNTER — Encounter: Payer: Self-pay | Admitting: Physician Assistant

## 2023-08-07 VITALS — BP 114/70 | HR 70 | Ht 68.0 in | Wt 210.0 lb

## 2023-08-07 DIAGNOSIS — I5020 Unspecified systolic (congestive) heart failure: Secondary | ICD-10-CM

## 2023-08-07 DIAGNOSIS — E7800 Pure hypercholesterolemia, unspecified: Secondary | ICD-10-CM | POA: Diagnosis not present

## 2023-08-07 DIAGNOSIS — I2510 Atherosclerotic heart disease of native coronary artery without angina pectoris: Secondary | ICD-10-CM

## 2023-08-07 DIAGNOSIS — I480 Paroxysmal atrial fibrillation: Secondary | ICD-10-CM | POA: Diagnosis not present

## 2023-08-07 DIAGNOSIS — I739 Peripheral vascular disease, unspecified: Secondary | ICD-10-CM | POA: Diagnosis not present

## 2023-08-07 NOTE — Patient Instructions (Signed)
 Medication Instructions:  Your physician recommends that you continue on your current medications as directed. Please refer to the Current Medication list given to you today.  *If you need a refill on your cardiac medications before your next appointment, please call your pharmacy*  Lab Work: Come to your next appointment fasting to get lab work Fasting Lipidis, ALT  If you have labs (blood work) drawn today and your tests are completely normal, you will receive your results only by: MyChart Message (if you have MyChart) OR A paper copy in the mail If you have any lab test that is abnormal or we need to change your treatment, we will call you to review the results.  Testing/Procedures: Your physician has requested that you have a lower extremity arterial exercise duplex and ABI's. During this test, exercise and ultrasound are used to evaluate arterial blood flow in the legs. Allow one hour for this exam. There are no restrictions or special instructions.   Your next appointment:   6 month(s)  Provider:   Oneil Parchment, MD

## 2023-08-08 ENCOUNTER — Ambulatory Visit: Admitting: Primary Care

## 2023-08-08 ENCOUNTER — Ambulatory Visit: Payer: Self-pay | Admitting: Primary Care

## 2023-08-08 ENCOUNTER — Encounter: Payer: Self-pay | Admitting: Primary Care

## 2023-08-08 VITALS — BP 122/74 | HR 69 | Temp 97.9°F | Ht 68.0 in | Wt 210.0 lb

## 2023-08-08 DIAGNOSIS — M5441 Lumbago with sciatica, right side: Secondary | ICD-10-CM | POA: Diagnosis not present

## 2023-08-08 DIAGNOSIS — E1159 Type 2 diabetes mellitus with other circulatory complications: Secondary | ICD-10-CM | POA: Diagnosis not present

## 2023-08-08 DIAGNOSIS — Z794 Long term (current) use of insulin: Secondary | ICD-10-CM

## 2023-08-08 DIAGNOSIS — E1122 Type 2 diabetes mellitus with diabetic chronic kidney disease: Secondary | ICD-10-CM

## 2023-08-08 DIAGNOSIS — N182 Chronic kidney disease, stage 2 (mild): Secondary | ICD-10-CM | POA: Diagnosis not present

## 2023-08-08 DIAGNOSIS — G8929 Other chronic pain: Secondary | ICD-10-CM | POA: Diagnosis not present

## 2023-08-08 DIAGNOSIS — M5442 Lumbago with sciatica, left side: Secondary | ICD-10-CM | POA: Diagnosis not present

## 2023-08-08 LAB — POCT GLYCOSYLATED HEMOGLOBIN (HGB A1C): Hemoglobin A1C: 8.9 % — AB (ref 4.0–5.6)

## 2023-08-08 LAB — MICROALBUMIN / CREATININE URINE RATIO
Creatinine,U: 81.6 mg/dL
Microalb Creat Ratio: 15.3 mg/g (ref 0.0–30.0)
Microalb, Ur: 1.3 mg/dL (ref 0.0–1.9)

## 2023-08-08 MED ORDER — FREESTYLE LIBRE 3 PLUS SENSOR MISC: 1 refills | Status: DC

## 2023-08-08 MED ORDER — DAPAGLIFLOZIN PROPANEDIOL 10 MG PO TABS: 10.0000 mg | ORAL_TABLET | Freq: Every day | ORAL | 1 refills | Status: AC

## 2023-08-08 NOTE — Assessment & Plan Note (Signed)
 Improved but still above goal with A1c of 8.9 today.  He has since resumed Ozempic  x 2 weeks so this will help improve A1c levels. Will refill Farxiga  for him to resume the 10 mg daily.  Start Farxiga  10 mg daily, continue Ozempic  1 mg weekly, continue Tresiba  200 units/mL 100 units daily, NovoLog  20 units 3 times daily with meals.  Foot exam today. Urine microalbumin pending.  We discussed to continue to monitor glucose levels and notify me if readings are not improving.  Follow-up in 3 months.

## 2023-08-08 NOTE — Patient Instructions (Addendum)
 Stop by the lab prior to leaving today. I will notify you of your results once received.   Resume Farxiga  10 mg once daily for diabetes.  Continue all other diabetes medications as discussed.  You will either be contacted via phone regarding your referral to neurosurgery, or you may receive a letter on your MyChart portal from our referral team with instructions for scheduling an appointment. Please let us  know if you have not been contacted by anyone within two weeks.  You will receive a phone call regarding the MRI.  Please schedule a follow up visit for 3 months.  It was a pleasure to see you today!

## 2023-08-08 NOTE — Assessment & Plan Note (Signed)
 Likely secondary to degenerative changes seen on x-ray.  No alarm signs on exam today. Reviewed x-ray from 2023. Proceed with ABIs and arterial duplex studies for which are pending.  MRI lumbar spine ordered and pending. Referral placed to neurosurgery

## 2023-08-08 NOTE — Progress Notes (Signed)
 Subjective:    Patient ID: [name], male    DOB: 26-May-1945, 78 y.o.   MRN: [id number]  HPI  [name] is a very pleasant 78 y.o. male with a history of CAD, cardiomyopathy with ICD, type 2 diabetes, paroxysmal atrial fibrillation, hyperlipidemia who presents today for follow-up of diabetes.  1) Type 2 Diabetes: Current medications include: Tresiba  200 units/mL, 100 units daily, NovoLog  20 units 3 times daily with meals, Ozempic  1 mg weekly, Farxiga  10 mg daily.  He stopped his Ozempic  for about 1 month due to his bronchitis. He resumed his Ozempic  about 2 weeks ago.   He stopped his Farxiga  about 3 months ago.   He is checking his blood glucose continuously with CGM. He has been treated with multiple steroid regimens over the summer due to ongoing bronchitis.   Time in range over last 7 days: 31% Time in range over last 14 days: 21% Time in range over the last 20 days: 24%  He noticed drops in glucose readings in the 50s several times weekly.    Last A1C: 9.5 in April 2025, 8.9 today Last Eye Exam: Up-to-date Last Foot Exam: Due Pneumonia Vaccination: 2025 Urine Microalbumin: Due Statin: Rosuvastatin   Dietary changes since last visit: Mostly home cooked meals or microwave food. He does eat out once weekly on average.    Exercise: None  2) Chronic Back Pain: Chronic for over the last 2+ years, located to the bilateral lower back with radiation to bilateral lower extremities down through his feet.  His pain is constant and very aggravating.   He denies numbness/tingling, loss of bowel/bladder control. Recently evaluated by his cardiologist who is ordering ABI and lower extremity arterial duplex studies due to his symptoms.   He underwent xray of the lumbar spine in 2023 which showed diffuse multilevel degenerative changes.  He has not undergone MRI and he has not undergone formal physical therapy program.  He has done back exercises at home that were provided by  sports medicine.   Review of Systems  Respiratory:  Negative for shortness of breath.   Cardiovascular:  Negative for chest pain.  Musculoskeletal:  Positive for arthralgias and back pain.  Neurological:  Negative for dizziness and numbness.         Past Medical History:  Diagnosis Date   Arthritis    fingers, shoulders (08/03/2015)   Atrial fibrillation (HCC)    Paroxysmal, rare episodes, sinus rhythm on flecainide , patient prefers not to take Coumadin   Bradycardia    April, 2013   Chest pain    Nuclear, 2006, no ischemia   Chronic lower back pain    all my life   Coronary artery disease    s/p staged cath 05/12/2014 and 5/11, DES x 2 to heavily calcified RCA, residual with 60% prox LAD, 70% D1   Ejection fraction    EF 55-60%,  echo, January, 2011   Elevated CPK    CPK elevated with normal MB and normal troponin the past   Heart murmur    History of echocardiogram    Echo 8/16: EF 55%, normal wall motion, grade 2 diastolic dysfunction, trivial MR, normal RV function, PASP 27 mmHg   Hypertension    IBS (irritable bowel syndrome)    Myocardial infarction (HCC) 06/2014   NSTEMI (non-ST elevated myocardial infarction) (HCC) 08/02/2015   Sinus drainage    Type II diabetes mellitus (HCC)    Unstable angina (HCC) 01/05/2019    Social  History   Socioeconomic History   Marital status: Married    Spouse name: Not on file   Number of children: Not on file   Years of education: Not on file   Highest education level: Not on file  Occupational History   Not on file  Tobacco Use   Smoking status: Never   Smokeless tobacco: Never  Vaping Use   Vaping status: Never Used  Substance and Sexual Activity   Alcohol use: No   Drug use: No   Sexual activity: Not on file  Other Topics Concern   Not on file  Social History Narrative   Not on file   Social Drivers of Health   Financial Resource Strain: Low Risk  (01/10/2023)   Overall Financial Resource Strain (CARDIA)     Difficulty of Paying Living Expenses: Not very hard  Food Insecurity: No Food Insecurity (01/10/2023)   Hunger Vital Sign    Worried About Running Out of Food in the Last Year: Never true    Ran Out of Food in the Last Year: Never true  Transportation Needs: No Transportation Needs (01/10/2023)   PRAPARE - Administrator, Civil Service (Medical): No    Lack of Transportation (Non-Medical): No  Physical Activity: Sufficiently Active (01/10/2023)   Exercise Vital Sign    Days of Exercise per Week: 3 days    Minutes of Exercise per Session: 150+ min  Stress: No Stress Concern Present (01/10/2023)   [employer] of Occupational Health - Occupational Stress Questionnaire    Feeling of Stress : Not at all  Social Connections: Socially Integrated (01/10/2023)   Social Connection and Isolation Panel    Frequency of Communication with Friends and Family: More than three times a week    Frequency of Social Gatherings with Friends and Family: More than three times a week    Attends Religious Services: More than 4 times per year    Active Member of Clubs or Organizations: Yes    Attends Banker Meetings: More than 4 times per year    Marital Status: Married  Catering manager Violence: Not At Risk (01/10/2023)   Humiliation, Afraid, Rape, and Kick questionnaire    Fear of Current or Ex-Partner: No    Emotionally Abused: No    Physically Abused: No    Sexually Abused: No    Past Surgical History:  Procedure Laterality Date   APPENDECTOMY     CARDIAC CATHETERIZATION N/A 05/12/2014   Procedure: Left Heart Cath and Coronary Angiography;  Surgeon: Debby DELENA Sor, MD;  Location: [facility];  Service: Cardiovascular;  Laterality: N/A;   CARDIAC CATHETERIZATION N/A 05/13/2014   Procedure: Coronary/Graft Atherectomy;  Surgeon: Debby DELENA Sor, MD;  Location: [facility];  Service: Cardiovascular;  Laterality: N/A;   CARDIAC CATHETERIZATION Right 05/13/2014   Procedure:  Temporary Pacemaker;  Surgeon: Debby DELENA Sor, MD;  Location: [facility];  Service: Cardiovascular;  Laterality: Right;   CARDIAC CATHETERIZATION N/A 05/13/2014   Procedure: Coronary Stent Intervention;  Surgeon: Debby DELENA Sor, MD;  Location: [facility];  Service: Cardiovascular;  Laterality: N/A;   CARDIAC CATHETERIZATION N/A 06/18/2014   Procedure: Left Heart Cath and Coronary Angiography;  Surgeon: Peter M Swaziland, MD;  Location: [facility]VASIVE CV LAB;  Service: Cardiovascular;  Laterality: N/A;   CARDIAC CATHETERIZATION N/A 08/03/2015   Procedure: Left Heart Cath and Cors/Grafts Angiography;  Surgeon: Lonni Hanson, MD;  Location: [facility] INVASIVE CV LAB;  Service: Cardiovascular;  Laterality: N/A;   CARDIOVASCULAR STRESS TEST  05/06/2014   CARDIOVERSION N/A 02/21/2022   Procedure: CARDIOVERSION;  Surgeon: Hobart Powell BRAVO, MD;  Location: [facility] END[facility];  Service: Cardiovascular;  Laterality: N/A;   CATARACT EXTRACTION W/ INTRAOCULAR LENS IMPLANT Left    COLONOSCOPY WITH PROPOFOL  N/A 02/13/2020   Procedure: COLONOSCOPY WITH PROPOFOL ;  Surgeon: Rollin Dover, MD;  Location: [facility]OSCOPY;  Service: Endoscopy;  Laterality: N/A;   CORONARY ARTERY BYPASS GRAFT N/A 06/22/2014   Procedure: CORONARY ARTERY BYPASS GRAFTING (CABG) x five, using left internal mammary artery, and right leg greater saphenous vein harvested endoscopically;  Surgeon: Elspeth JAYSON Millers, MD;  Location: [facility];  Service: Open Heart Surgery;  Laterality: N/A;   CORONARY STENT INTERVENTION N/A 07/03/2023   Procedure: CORONARY STENT INTERVENTION;  Surgeon: Elmira Newman PARAS, MD;  Location: [facility];  Service: Cardiovascular;  Laterality: N/A;   ICD IMPLANT N/A 01/07/2019   Procedure: ICD IMPLANT;  Surgeon: Kelsie Agent, MD;  Location: [facility];  Service: Cardiovascular;  Laterality: N/A;   LAPAROSCOPIC CHOLECYSTECTOMY     LEFT HEART CATH AND CORS/GRAFTS ANGIOGRAPHY N/A 01/06/2019   Procedure: LEFT HEART  CATH AND CORS/GRAFTS ANGIOGRAPHY;  Surgeon: Mady Bruckner, MD;  Location: [facility];  Service: Cardiovascular;  Laterality: N/A;   LEFT HEART CATH AND CORS/GRAFTS ANGIOGRAPHY N/A 07/03/2023   Procedure: LEFT HEART CATH AND CORS/GRAFTS ANGIOGRAPHY;  Surgeon: Elmira Newman PARAS, MD;  Location: [facility];  Service: Cardiovascular;  Laterality: N/A;   MAZE N/A 06/22/2014   Procedure: MAZE;  Surgeon: Elspeth JAYSON Millers, MD;  Location: [facility] OR;  Service: Open Heart Surgery;  Laterality: N/A;   POLYPECTOMY  02/13/2020   Procedure: POLYPECTOMY;  Surgeon: Rollin Dover, MD;  Location: [facility] ENDOSCOPY;  Service: Endoscopy;;   TEE WITHOUT CARDIOVERSION N/A 06/22/2014   Procedure: TRANSESOPHAGEAL ECHOCARDIOGRAM (TEE);  Surgeon: Elspeth JAYSON Millers, MD;  Location: [facility] OR;  Service: Open Heart Surgery;  Laterality: N/A;    Family History  Problem Relation Age of Onset   Alzheimer's disease Mother    Emphysema Father    Cancer Brother    Arrhythmia Sister    Heart attack Sister    Heart disease Sister    Hyperlipidemia Sister    Hypertension Sister     Allergies  Allergen Reactions   Lipitor [Atorvastatin ] Other (See Comments)    Myopathy Spring 2006   Pravastatin  Other (See Comments)    LEG CRAMPS   Simvastatin  Other (See Comments)    LEG CRAMPS    Current Outpatient Medications on File Prior to Visit  Medication Sig Dispense Refill   albuterol  (VENTOLIN  HFA) 108 (90 Base) MCG/ACT inhaler Inhale 2 puffs into the lungs every 6 (six) hours as needed for wheezing or shortness of breath. 8 g 3   clopidogrel  (PLAVIX ) 75 MG tablet Take 1 tablet (75 mg total) by mouth daily. 90 tablet 3   ELIQUIS  5 MG TABS tablet TAKE 1 TABLET BY MOUTH TWICE A DAY 60 tablet 5   Evolocumab  (REPATHA  SURECLICK) 140 MG/ML SOAJ Inject 140 mg into the skin every 14 (fourteen) days. 6 mL 3   insulin  aspart (NOVOLOG  FLEXPEN) 100 UNIT/ML FlexPen Inject 20 Units into the skin 3 (three) times daily with meals.      insulin  degludec (TRESIBA  FLEXTOUCH) 200 UNIT/ML FlexTouch Pen Inject 100 Units into the skin daily. Via Novo Cares PAP 36 mL 0   Insulin  Pen Needle 31G X 8 MM  MISC Use as directed with Lantus . 100 each 5   metoprolol  succinate (TOPROL -XL) 50 MG 24 hr tablet Take 1 tablet (50 mg total) by mouth in the morning and at bedtime. Take with or immediately following a meal. 180 tablet 3   nitroGLYCERIN  (NITROSTAT ) 0.4 MG SL tablet Place 1 tablet (0.4 mg total) under the tongue every 5 (five) minutes as needed for chest pain. 25 tablet 6   ondansetron  (ZOFRAN -ODT) 4 MG disintegrating tablet Take 1 tablet (4 mg total) by mouth every 8 (eight) hours as needed for nausea or vomiting. 15 tablet 0   OVER THE COUNTER MEDICATION Place 1 drop into both eyes 2 (two) times daily. Happy body  liquid msm eye drops/ for floaters in the eyes     rosuvastatin  (CRESTOR ) 10 MG tablet Take 1 tablet (10 mg total) by mouth daily. 90 tablet 3   sacubitril-valsartan (ENTRESTO ) 24-26 MG TAKE 1 TABLET BY MOUTH TWICE A DAY 180 tablet 3   Semaglutide , 1 MG/DOSE, 4 MG/3ML SOPN Inject 1 mg as directed once a week. for diabetes.     Spacer/Aero-Hold Chamber Bags MISC 1 Bag by Does not apply route as needed. 1 Bag 3   tamsulosin  (FLOMAX ) 0.4 MG CAPS capsule TAKE 1 CAPSULE (0.4 MG TOTAL) BY MOUTH DAILY. FOR URINE FLOW. (Patient not taking: Reported on 08/08/2023) 90 capsule 3   No current facility-administered medications on file prior to visit.    BP 122/74   Pulse 69   Temp 97.9 F (36.6 C) (Temporal)   Ht 5' 8 (1.727 m)   Wt 210 lb (95.3 kg)   SpO2 100%   BMI 31.93 kg/m  Objective:   Physical Exam Cardiovascular:     Rate and Rhythm: Normal rate and regular rhythm.  Pulmonary:     Effort: Pulmonary effort is normal.     Breath sounds: Normal breath sounds.  Musculoskeletal:     Cervical back: Neck supple.     Lumbar back: No tenderness or bony tenderness. Decreased range of motion. Negative right straight leg raise  test and negative left straight leg raise test.       Back:  Skin:    General: Skin is warm and dry.  Neurological:     Mental Status: He is alert and oriented to person, place, and time.  Psychiatric:        Mood and Affect: Mood normal.           Assessment & Plan:  Type 2 diabetes mellitus with stage 2 chronic kidney disease, with long-term current use of insulin  (HCC) Assessment & Plan: Improved but still above goal with A1c of 8.9 today.  He has since resumed Ozempic  x 2 weeks so this will help improve A1c levels. Will refill Farxiga  for him to resume the 10 mg daily.  Start Farxiga  10 mg daily, continue Ozempic  1 mg weekly, continue Tresiba  200 units/mL 100 units daily, NovoLog  20 units 3 times daily with meals.  Foot exam today. Urine microalbumin pending.  We discussed to continue to monitor glucose levels and notify me if readings are not improving.  Follow-up in 3 months.  Orders: -     POCT glycosylated hemoglobin (Hb A1C) -     FreeStyle Libre 3 Plus Sensor; Use to check blood sugar continuously. Change sensor every 15 days.  Dispense: 6 each; Refill: 1 -     Dapagliflozin  Propanediol; Take 1 tablet (10 mg total) by mouth daily before breakfast. For diabetes.  Dispense:  90 tablet; Refill: 1 -     Microalbumin / creatinine urine ratio  Type 2 diabetes mellitus with other circulatory complication, with long-term current use of insulin  (HCC) Assessment & Plan: Improved but still above goal with A1c of 8.9 today.  He has since resumed Ozempic  x 2 weeks so this will help improve A1c levels. Will refill Farxiga  for him to resume the 10 mg daily.  Start Farxiga  10 mg daily, continue Ozempic  1 mg weekly, continue Tresiba  200 units/mL 100 units daily, NovoLog  20 units 3 times daily with meals.  Foot exam today. Urine microalbumin pending.  We discussed to continue to monitor glucose levels and notify me if readings are not improving.  Follow-up in 3  months.  Orders: -     FreeStyle Libre 3 Plus Sensor; Use to check blood sugar continuously. Change sensor every 15 days.  Dispense: 6 each; Refill: 1 -     Dapagliflozin  Propanediol; Take 1 tablet (10 mg total) by mouth daily before breakfast. For diabetes.  Dispense: 90 tablet; Refill: 1 -     Microalbumin / creatinine urine ratio  Chronic bilateral low back pain with bilateral sciatica Assessment & Plan: Likely secondary to degenerative changes seen on x-ray.  No alarm signs on exam today. Reviewed x-ray from 2023. Proceed with ABIs and arterial duplex studies for which are pending.  MRI lumbar spine ordered and pending. Referral placed to neurosurgery  Orders: -     MR LUMBAR SPINE WO CONTRAST; Future -     Ambulatory referral to Neurosurgery        Comer MARLA Gaskins, NP

## 2023-08-14 ENCOUNTER — Ambulatory Visit (HOSPITAL_BASED_OUTPATIENT_CLINIC_OR_DEPARTMENT_OTHER)
Admission: RE | Admit: 2023-08-14 | Discharge: 2023-08-14 | Disposition: A | Discharge status: Home / Self Care | Source: Ambulatory Visit | Attending: Physician Assistant | Admitting: Physician Assistant

## 2023-08-14 ENCOUNTER — Ambulatory Visit (HOSPITAL_COMMUNITY)
Admission: RE | Admit: 2023-08-14 | Discharge: 2023-08-14 | Disposition: A | Discharge status: Home / Self Care | Source: Ambulatory Visit | Attending: Physician Assistant | Admitting: Physician Assistant

## 2023-08-14 ENCOUNTER — Ambulatory Visit: Payer: Self-pay | Admitting: Physician Assistant

## 2023-08-14 DIAGNOSIS — I739 Peripheral vascular disease, unspecified: Secondary | ICD-10-CM | POA: Diagnosis not present

## 2023-08-14 LAB — VAS US ABI WITH/WO TBI

## 2023-08-17 DIAGNOSIS — I480 Paroxysmal atrial fibrillation: Secondary | ICD-10-CM

## 2023-08-22 ENCOUNTER — Encounter (HOSPITAL_BASED_OUTPATIENT_CLINIC_OR_DEPARTMENT_OTHER): Payer: Self-pay

## 2023-08-22 NOTE — Progress Notes (Signed)
 Pt has been made aware of normal result and verbalized understanding.  jw

## 2023-08-23 ENCOUNTER — Ambulatory Visit (HOSPITAL_BASED_OUTPATIENT_CLINIC_OR_DEPARTMENT_OTHER)

## 2023-08-30 ENCOUNTER — Telehealth: Payer: Self-pay | Admitting: Pharmacist

## 2023-08-30 DIAGNOSIS — E7849 Other hyperlipidemia: Secondary | ICD-10-CM

## 2023-08-30 NOTE — Telephone Encounter (Signed)
 Due for follow up lipid lab - Rep[facility]  started 3 months back. Call to remind, Pt will go for lab on Aug 29.

## 2023-08-31 ENCOUNTER — Other Ambulatory Visit: Payer: Self-pay

## 2023-08-31 ENCOUNTER — Other Ambulatory Visit: Payer: Self-pay | Admitting: Pharmacist

## 2023-08-31 DIAGNOSIS — E1122 Type 2 diabetes mellitus with diabetic chronic kidney disease: Secondary | ICD-10-CM

## 2023-08-31 NOTE — Progress Notes (Unsigned)
 Referring Physician:  Gretta Comer POUR, NP [address] [place],  [place] [address]  Primary Physician:  Gretta Comer POUR, NP  History of Present Illness: 09/05/2023 [name] has a history of afib, CAD, heart failure with reduced EF, HTN, junctional bradycardia, ischemic cardiomyopathy, IBS, NASH, DM, BPH, hyperlipidemia, h/o CABG.   He has a cardiac defibrillator.   He has chronic constant LBP with  bilateral posterior leg pain to his feet x 3 years and is getting worse. LBP > leg pain. No numbness. Has tingling in his toes. Feels like legs are weak when pain is severe. He has some right sided hip pain as well. Pain is worse with walking and prolonged sitting. No alleviating factors.   He is on PLAVIX  and ELIQUIS .   Tobacco use:  Does not smoke.   Bowel/Bladder Dysfunction: none  Conservative measures:  Physical therapy:  has not participated in recently, was given HEP by sports medicine Multimodal medical therapy including regular antiinflammatories:  Advil Injections:  no epidural steroid injections  Past Surgery: no spinal surgeries  [name] has no symptoms of cervical myelopathy.  The symptoms are causing a significant impact on the patient's life.   Review of Systems:  A 10 point review of systems is negative, except for the pertinent positives and negatives detailed in the HPI.  Past Medical History: Past Medical History:  Diagnosis Date   Arthritis    fingers, shoulders (08/03/2015)   Atrial fibrillation (HCC)    Paroxysmal, rare episodes, sinus rhythm on flecainide , patient prefers not to take Coumadin   Bradycardia    April, 2013   Chest pain    Nuclear, 2006, no ischemia   Chronic lower back pain    all my life   Coronary artery disease    s/p staged cath 05/12/2014 and 5/11, DES x 2 to heavily calcified RCA, residual with 60% prox LAD, 70% D1   Ejection fraction    EF 55-60%,  echo, January, 2011   Elevated CPK    CPK elevated  with normal MB and normal troponin the past   Heart murmur    History of echocardiogram    Echo 8/16: EF 55%, normal wall motion, grade 2 diastolic dysfunction, trivial MR, normal RV function, PASP 27 mmHg   Hypertension    IBS (irritable bowel syndrome)    Myocardial infarction (HCC) 06/2014   NSTEMI (non-ST elevated myocardial infarction) (HCC) 08/02/2015   Sinus drainage    Type II diabetes mellitus (HCC)    Unstable angina (HCC) 01/05/2019    Past Surgical History: Past Surgical History:  Procedure Laterality Date   APPENDECTOMY     CARDIAC CATHETERIZATION N/A 05/12/2014   Procedure: Left Heart Cath and Coronary Angiography;  Surgeon: Debby DELENA Sor, MD;  Location: [facility];  Service: Cardiovascular;  Laterality: N/A;   CARDIAC CATHETERIZATION N/A 05/13/2014   Procedure: Coronary/Graft Atherectomy;  Surgeon: Debby DELENA Sor, MD;  Location: [facility];  Service: Cardiovascular;  Laterality: N/A;   CARDIAC CATHETERIZATION Right 05/13/2014   Procedure: Temporary Pacemaker;  Surgeon: Debby DELENA Sor, MD;  Location: [facility];  Service: Cardiovascular;  Laterality: Right;   CARDIAC CATHETERIZATION N/A 05/13/2014   Procedure: Coronary Stent Intervention;  Surgeon: Debby DELENA Sor, MD;  Location: [facility];  Service: Cardiovascular;  Laterality: N/A;   CARDIAC CATHETERIZATION N/A 06/18/2014   Procedure: Left Heart Cath and Coronary Angiography;  Surgeon: Peter M Swaziland, MD;  Location: [facility];  Service: Cardiovascular;  Laterality: N/A;   CARDIAC CATHETERIZATION N/A 08/03/2015   Procedure: Left Heart Cath and Cors/Grafts Angiography;  Surgeon: Lonni Hanson, MD;  Location: [facility] INVASIVE CV LAB;  Service: Cardiovascular;  Laterality: N/A;   CARDIOVASCULAR STRESS TEST  05/06/2014   CARDIOVERSION N/A 02/21/2022   Procedure: CARDIOVERSION;  Surgeon: Hobart Powell BRAVO, MD;  Location: [facility] ENDOSCOPY;  Service: Cardiovascular;  Laterality: N/A;    CATARACT EXTRACTION W/ INTRAOCULAR LENS IMPLANT Left    COLONOSCOPY WITH PROPOFOL  N/A 02/13/2020   Procedure: COLONOSCOPY WITH PROPOFOL ;  Surgeon: Rollin Dover, MD;  Location: [facility] ENDOSCOPY;  Service: Endoscopy;  Laterality: N/A;   CORONARY ARTERY BYPASS GRAFT N/A 06/22/2014   Procedure: CORONARY ARTERY BYPASS GRAFTING (CABG) x five, using left internal mammary artery, and right leg greater saphenous vein harvested endoscopically;  Surgeon: Elspeth JAYSON Millers, MD;  Location: [facility] OR;  Service: Open Heart Surgery;  Laterality: N/A;   CORONARY STENT INTERVENTION N/A 07/03/2023   Procedure: CORONARY STENT INTERVENTION;  Surgeon: Elmira Newman PARAS, MD;  Location: [facility];  Service: Cardiovascular;  Laterality: N/A;   ICD IMPLANT N/A 01/07/2019   Procedure: ICD IMPLANT;  Surgeon: Kelsie Agent, MD;  Location: [facility];  Service: Cardiovascular;  Laterality: N/A;   LAPAROSCOPIC CHOLECYSTECTOMY     LEFT HEART CATH AND CORS/GRAFTS ANGIOGRAPHY N/A 01/06/2019   Procedure: LEFT HEART CATH AND CORS/GRAFTS ANGIOGRAPHY;  Surgeon: Hanson Lonni, MD;  [facility]: [facility];  Service: Cardiovascular;  Laterality: N/A;   LEFT HEART CATH AND CORS/GRAFTS ANGIOGRAPHY N/A 07/03/2023   Procedure: LEFT HEART CATH AND CORS/GRAFTS ANGIOGRAPHY;  Surgeon: Elmira Newman PARAS, MD;  Location: [facility];  Service: Cardiovascular;  Laterality: N/A;   MAZE N/A 06/22/2014   Procedure: MAZE;  Surgeon: Elspeth JAYSON Millers, MD;  Location: [facility] OR;  Service: Open Heart Surgery;  Laterality: N/A;   POLYPECTOMY  02/13/2020   Procedure: POLYPECTOMY;  Surgeon: Rollin Dover, MD;  Location: [facility] ENDOSCOPY;  Service: Endoscopy;;   TEE WITHOUT CARDIOVERSION N/A 06/22/2014   Procedure: TRANSESOPHAGEAL ECHOCARDIOGRAM (TEE);  Surgeon: Elspeth JAYSON Millers, MD;  Location: [facility] OR;  Service: Open Heart Surgery;  Laterality: N/A;    Allergies: Allergies as of 09/05/2023 - Review Complete 08/08/2023   Allergen Reaction Noted   Lipitor [atorvastatin ] Other (See Comments) 09/06/2011   Pravastatin  Other (See Comments) 06/18/2014   Simvastatin  Other (See Comments) 06/18/2014    Medications: Outpatient Encounter Medications as of 09/05/2023  Medication Sig   albuterol  (VENTOLIN  HFA) 108 (90 Base) MCG/ACT inhaler Inhale 2 puffs into the lungs every 6 (six) hours as needed for wheezing or shortness of breath.   clopidogrel  (PLAVIX ) 75 MG tablet Take 1 tablet (75 mg total) by mouth daily.   Continuous Glucose Sensor (FREESTYLE LIBRE 3 PLUS SENSOR) MISC Use to check blood sugar continuously. Change sensor every 15 days.   dapagliflozin  propanediol (FARXIGA ) 10 MG TABS tablet Take 1 tablet (10 mg total) by mouth daily before breakfast. For diabetes.   ELIQUIS  5 MG TABS tablet TAKE 1 TABLET BY MOUTH TWICE A DAY   Evolocumab  (REPATHA  SURECLICK) 140 MG/ML SOAJ Inject 140 mg into the skin every 14 (fourteen) days.   insulin  aspart (NOVOLOG  FLEXPEN) 100 UNIT/ML FlexPen Inject 20 Units into the skin 3 (three) times daily with meals.   insulin  degludec (TRESIBA  FLEXTOUCH) 200 UNIT/ML FlexTouch Pen Inject 100 Units into the skin daily. Via [employer] PAP   Insulin  Pen Needle  31G X 8 MM MISC Use as directed with Lantus .   metoprolol  succinate (TOPROL -XL) 50 MG 24 hr tablet Take 1 tablet (50 mg total) by mouth in the morning and at bedtime. Take with or immediately following a meal.   nitroGLYCERIN  (NITROSTAT ) 0.4 MG SL tablet Place 1 tablet (0.4 mg total) under the tongue every 5 (five) minutes as needed for chest pain.   ondansetron  (ZOFRAN -ODT) 4 MG disintegrating tablet Take 1 tablet (4 mg total) by mouth every 8 (eight) hours as needed for nausea or vomiting.   OVER THE COUNTER MEDICATION Place 1 drop into both eyes 2 (two) times daily. Happy body  liquid msm eye drops/ for floaters in the eyes   rosuvastatin  (CRESTOR ) 10 MG tablet Take 1 tablet (10 mg total) by mouth daily.   sacubitril-valsartan  (ENTRESTO ) 24-26 MG TAKE 1 TABLET BY MOUTH TWICE A DAY   Semaglutide , 1 MG/DOSE, 4 MG/3ML SOPN Inject 1 mg as directed once a week. for diabetes.   Spacer/Aero-Hold Chamber Bags MISC 1 Bag by Does not apply route as needed.   tamsulosin  (FLOMAX ) 0.4 MG CAPS capsule TAKE 1 CAPSULE (0.4 MG TOTAL) BY MOUTH DAILY. FOR URINE FLOW.   No facility-administered encounter medications on file as of 09/05/2023.    Social History: Social History   Tobacco Use   Smoking status: Never   Smokeless tobacco: Never  Vaping Use   Vaping status: Never Used  Substance Use Topics   Alcohol use: No   Drug use: No    Family Medical History: Family History  Problem Relation Age of Onset   Alzheimer's disease Mother    Emphysema Father    Cancer Brother    Arrhythmia Sister    Heart attack Sister    Heart disease Sister    Hyperlipidemia Sister    Hypertension Sister     Physical Examination: Vitals:   09/05/23 1053  BP: 128/62    General: Patient is well developed, well nourished, calm, collected, and in no apparent distress. Attention to examination is appropriate.  Respiratory: Patient is breathing without any difficulty.   NEUROLOGICAL:     Awake, alert, oriented to person, place, and time.  Speech is clear and fluent. Fund of knowledge is appropriate.   Cranial Nerves: Pupils equal round and reactive to light.  Facial tone is symmetric.    Diffuse lower posterior lumbar tenderness.   No abnormal lesions on exposed skin.   Strength: Side Biceps Triceps Deltoid Interossei Grip Wrist Ext. Wrist Flex.  R 5 5 5 5 5 5 5   L 5 5 5 5 5 5 5    Side Iliopsoas Quads Hamstring PF DF EHL  R 5 5 5 5 5 5   L 5 5 5 5 5 5    Reflexes are 2+ and symmetric at the biceps, brachioradialis, patella and achilles.   Hoffman's is absent.  Clonus is not present.   Bilateral upper and lower extremity sensation is intact to light touch.     No pain with IR/ER of bilateral hips.   He can heel stand on  both feet. He can toe stand on both right/left independently.   He has point tenderness over left GTB. No tenderness over right GTB.   Gait is slow.   Medical Decision Making  Imaging: Lumbar MRI dated 09/04/23:  FINDINGS:   BONES AND ALIGNMENT: Slight degenerative anterolisthesis at L3-4. Focal fatty foci within the vertebral bodies throughout the visualized thoracolumbar spine.   SPINAL CORD: The conus  medullaris terminates at the mid body of L1.   SOFT TISSUES: No paraspinal mass.   L1-L2: No significant disc herniation. No spinal canal stenosis or neural foraminal narrowing.   L2-L3: Mild diffuse disc bulging causing mild central spinal canal stenosis and mild bilateral lateral recess stenosis.   L3-L4: Degenerative anterolisthesis and mild facet hypertrophy, causing mild-to-moderate central spinal canal stenosis and mild-to-moderate bilateral lateral recess stenosis. There is no apparent nerve root impingement.   L4-L5: Small bilateral lateral recess stenosis.   L5-S1: Mild central disc bulge causing mild central spinal canal stenosis. The neural foramina are widely patent.   IMPRESSION: 1. Mild-to-moderate central spinal canal stenosis and mild-to-moderate bilateral lateral recess stenosis at L3-4 due to degenerative anterolisthesis and mild facet hypertrophy. No apparent nerve root impingement. 2. Mild central spinal canal stenosis and mild bilateral lateral recess stenosis at L2-3 due to mild diffuse disc bulging.   Electronically signed by: Evalene Coho MD 09/04/2023 12:50 PM EDT RP Workstation: [id number]        I have personally reviewed the images and agree with the above interpretation.  MRI reviewed with Dr. Claudene- likely has hemangioma at L4.   Assessment and Plan: Mr. Brostrom chronic constant LBP with  bilateral posterior leg pain to his feet x 3 years and is getting worse. LBP > leg pain. Pain is worse with walking and prolonged sitting.    He has known mild central stenosis L2-L3 and L5-S1 he has moderate central stenosis L3-L4 along with multilevel lateral recess stenosis.   He has point tenderness over left GTB and this pain is likely due to greater trochanteric bursitis.   Treatment options discussed with patient and following plan made:   - PT recommended and he declines.  - Recommend referral to ortho for left GTB. He declines.  - Referral to pain management at [facility]) to consider lumbar injections.  - Of note, he is on PLAVIX  and ELIQUIS .  - Follow up with me in 8-12 weeks for recheck.   I spent a total of 35 minutes in face-to-face and non-face-to-face activities related to this patient's care today including review of outside records, review of imaging, review of symptoms, physical exam, discussion of differential diagnosis, discussion of treatment options, and documentation.   Thank you for involving me in the care of this patient.   Glade Boys PA-C Dept. of Neurosurgery

## 2023-08-31 NOTE — Progress Notes (Signed)
 Brief Telephone Documentation Reason for Call: CGM review  Summary of Call: Called patient to follow up on blood sugars and insulin  dosing after PCP visit ~3 weeks ago.  Unable to leave message as the voicemail is full.  Mychart message sent.   [name], PharmD Clinical Pharmacist [facility] [phone]

## 2023-08-31 NOTE — CV Procedure (Signed)
  Device system confirmed to be MRI conditional, with implant date > 6 weeks ago, and no evidence of abandoned or epicardial leads in review of most recent CXR  Device last cleared by EP Provider: Daphne Barrack 08/30/2023  Clearance is good through for 1 year as long as parameters remain stable at time of check. If pt undergoes a cardiac device procedure during that time, they should be re-cleared.   Tachy-therapies to be programmed off if applicable with device back to pre-MRI settings after completion of exam.  [employer] - Programming recommendation received through [employer] App/Tablet  Izetta CHRISTELLA Linen, RT  08/31/2023 10:36 AM

## 2023-09-04 ENCOUNTER — Ambulatory Visit (HOSPITAL_BASED_OUTPATIENT_CLINIC_OR_DEPARTMENT_OTHER)
Admission: RE | Admit: 2023-09-04 | Discharge: 2023-09-04 | Disposition: A | Discharge status: Home / Self Care | Source: Ambulatory Visit | Attending: Primary Care | Admitting: Primary Care

## 2023-09-04 DIAGNOSIS — M5116 Intervertebral disc disorders with radiculopathy, lumbar region: Secondary | ICD-10-CM | POA: Diagnosis not present

## 2023-09-04 DIAGNOSIS — M5441 Lumbago with sciatica, right side: Secondary | ICD-10-CM | POA: Diagnosis not present

## 2023-09-04 DIAGNOSIS — M4316 Spondylolisthesis, lumbar region: Secondary | ICD-10-CM | POA: Diagnosis not present

## 2023-09-04 DIAGNOSIS — M5442 Lumbago with sciatica, left side: Secondary | ICD-10-CM | POA: Diagnosis not present

## 2023-09-04 DIAGNOSIS — M5117 Intervertebral disc disorders with radiculopathy, lumbosacral region: Secondary | ICD-10-CM | POA: Diagnosis not present

## 2023-09-04 DIAGNOSIS — G8929 Other chronic pain: Secondary | ICD-10-CM | POA: Diagnosis not present

## 2023-09-04 DIAGNOSIS — M48061 Spinal stenosis, lumbar region without neurogenic claudication: Secondary | ICD-10-CM | POA: Diagnosis not present

## 2023-09-04 NOTE — Progress Notes (Signed)
 Patient was monitored by this RN during MRI scan due to presence of an ICD. Cardiac rhythm was continuously monitored throughout the procedure. Prior to the start of the scan, the ICD was placed in MRI-safe mode by the MRI technician. Following the completion of the scan, the device was returned to its pre-MRI settings. Neurological status and orientation post-procedure were unchanged from baseline.   Pre-procedure Heart Rate (Prior to being placed in MRI safe mode): 71 Post-procedure Heart Rate (Once pacemaker is returned to baseline mode): 73

## 2023-09-05 ENCOUNTER — Ambulatory Visit (INDEPENDENT_AMBULATORY_CARE_PROVIDER_SITE_OTHER): Admitting: Orthopedic Surgery

## 2023-09-05 ENCOUNTER — Encounter: Payer: Self-pay | Admitting: Orthopedic Surgery

## 2023-09-05 ENCOUNTER — Ambulatory Visit: Admitting: Orthopedic Surgery

## 2023-09-05 VITALS — BP 128/62 | Ht 68.0 in | Wt 211.5 lb

## 2023-09-05 DIAGNOSIS — M7062 Trochanteric bursitis, left hip: Secondary | ICD-10-CM

## 2023-09-05 DIAGNOSIS — M48061 Spinal stenosis, lumbar region without neurogenic claudication: Secondary | ICD-10-CM

## 2023-09-05 DIAGNOSIS — R29898 Other symptoms and signs involving the musculoskeletal system: Secondary | ICD-10-CM

## 2023-09-05 DIAGNOSIS — M5416 Radiculopathy, lumbar region: Secondary | ICD-10-CM

## 2023-09-05 DIAGNOSIS — M47816 Spondylosis without myelopathy or radiculopathy, lumbar region: Secondary | ICD-10-CM

## 2023-09-05 DIAGNOSIS — M48062 Spinal stenosis, lumbar region with neurogenic claudication: Secondary | ICD-10-CM

## 2023-09-05 NOTE — Patient Instructions (Signed)
 It was so nice to see you today. Thank you so much for coming in.    You have some wear and tear in your back along with spinal stenosis. This is likely causing your back and leg pain.   I think your left hip pain is from bursitis. Let me know if you want to see ortho for this.   I want you to see pain management here in [place] (Dr. Marcelino) to discuss possible lumbar injections. They should call you to schedule an appointment or you can call them at [phone].   I will see you back in 6-8. Please do not hesitate to call if you have any questions or concerns. You can also message me in MyChart.   Glade Boys PA-C [phone]     The physicians and staff at [facility] at [facility] are committed to providing excellent care. You may receive a survey asking for feedback about your experience at our office. We value you your feedback and appreciate you taking the time to to fill it out. The [facility] leadership team is also available to discuss your experience in person, feel free to contact us  [phone].

## 2023-09-20 ENCOUNTER — Ambulatory Visit
Attending: Student in an Organized Health Care Education/Training Program | Admitting: Student in an Organized Health Care Education/Training Program

## 2023-09-20 VITALS — BP 118/88 | HR 75 | Temp 98.1°F | Resp 16 | Ht 68.0 in | Wt 210.0 lb

## 2023-09-20 DIAGNOSIS — M5416 Radiculopathy, lumbar region: Secondary | ICD-10-CM | POA: Diagnosis not present

## 2023-09-20 DIAGNOSIS — Z7901 Long term (current) use of anticoagulants: Secondary | ICD-10-CM

## 2023-09-20 DIAGNOSIS — G894 Chronic pain syndrome: Secondary | ICD-10-CM | POA: Insufficient documentation

## 2023-09-20 DIAGNOSIS — M48062 Spinal stenosis, lumbar region with neurogenic claudication: Secondary | ICD-10-CM | POA: Diagnosis not present

## 2023-09-20 DIAGNOSIS — G8929 Other chronic pain: Secondary | ICD-10-CM | POA: Diagnosis not present

## 2023-09-20 DIAGNOSIS — I2510 Atherosclerotic heart disease of native coronary artery without angina pectoris: Secondary | ICD-10-CM | POA: Insufficient documentation

## 2023-09-20 DIAGNOSIS — Z951 Presence of aortocoronary bypass graft: Secondary | ICD-10-CM | POA: Insufficient documentation

## 2023-09-20 DIAGNOSIS — Z7902 Long term (current) use of antithrombotics/antiplatelets: Secondary | ICD-10-CM

## 2023-09-20 DIAGNOSIS — M48061 Spinal stenosis, lumbar region without neurogenic claudication: Secondary | ICD-10-CM | POA: Diagnosis not present

## 2023-09-20 MED ORDER — GABAPENTIN 100 MG PO CAPS: ORAL_CAPSULE | ORAL | 0 refills | Status: DC

## 2023-09-20 NOTE — Progress Notes (Signed)
 Safety precautions to be maintained throughout the outpatient stay will include: orient to surroundings, keep bed in low position, maintain call bell within reach at all times, provide assistance with transfer out of bed and ambulation.

## 2023-09-20 NOTE — Patient Instructions (Addendum)
 Pain Management Discharge Instructions  General Discharge Instructions :  If you need to reach your doctor call: Monday-Friday 8:00 am - 4:00 pm at [phone] or toll free [phone].  After clinic hours [phone] to have operator reach doctor.  Bring all of your medication bottles to all your appointments in the pain clinic.  To cancel or reschedule your appointment with Pain Management please remember to call 24 hours in advance to avoid a fee.  Refer to the educational materials which you have been given on: General Risks, I had my Procedure. Discharge Instructions, Post Sedation.  Post Procedure Instructions:  The drugs you were given will stay in your system until tomorrow, so for the next 24 hours you should not drive, make any legal decisions or drink any alcoholic beverages.  You may eat anything you prefer, but it is better to start with liquids then soups and crackers, and gradually work up to solid foods.  Please notify your doctor immediately if you have any unusual bleeding, trouble breathing or pain that is not related to your normal pain.  Depending on the type of procedure that was done, some parts of your body may feel week and/or numb.  This usually clears up by tonight or the next day.  Walk with the use of an assistive device or accompanied by an adult for the 24 hours.  You may use ice on the affected area for the first 24 hours.  Put ice in a Ziploc bag and cover with a towel and place against area 15 minutes on 15 minutes off.  You may switch to heat after 24 hours.You will need to stop Plavix  for 7 days prior and Eliqius 3 days prior, OK TO TAKE ASA 81 mg in order to do your spinal injection. Given that you had recent stents in July, your cardiologist may want you to remain on dual antiplatelet therapy for 3-6 months. Please see when it would be ok to stop Plavix  and Eliquis  in the context of your CAD and PCI hx.

## 2023-09-20 NOTE — Progress Notes (Signed)
 PROVIDER NOTE: Interpretation of information contained herein should be left to medically-trained personnel. Specific patient instructions are provided elsewhere under Patient Instructions section of medical record. This document was created in part using AI and STT-dictation technology, any transcriptional errors that may result from this process are unintentional.  Patient: [name]  Service: E/M Encounter  Provider: Wallie Sherry, MD  DOB: [birth year]  Delivery: Face-to-face  Specialty: Interventional Pain Management  MRN: [id number]  Setting: Ambulatory outpatient facility  Specialty designation: 09  Type: New Patient  Location: Outpatient office facility  PCP: Gretta Comer POUR, NP  DOS: 09/20/2023    Referring Prov.: Hilma Hastings, PA-C   Primary Reason(s) for Visit: Encounter for initial evaluation of one or more chronic problems (new to examiner) potentially causing chronic pain, and posing a threat to normal musculoskeletal function. (Level of risk: High) CC: Back Pain  HPI  [name] is a 78 y.o. year old, male patient, who comes for the first time to our practice referred by Hilma Hastings, PA-C for our initial evaluation of his chronic pain. He has DM type 2 (diabetes mellitus, type 2) (HCC); Paroxysmal atrial fibrillation (HCC); BPH (benign prostatic hyperplasia); IBS (irritable bowel syndrome); Bursitis of hip; Hypertension; Rotator cuff tendinitis; NASH (nonalcoholic steatohepatitis); Hyperlipidemia; CAD (coronary artery disease); Junctional bradycardia; Cardiomyopathy, ischemic; Nonsustained ventricular tachycardia (HCC); Hx of CABG; Insomnia; Allergic rhinitis; Syncope; ICD (implantable cardioverter-defibrillator) in place; Preventative health care; HFrEF (heart failure with reduced ejection fraction) (HCC); Chronic bilateral low back pain with bilateral sciatica; Spinal stenosis, lumbar region, with neurogenic claudication; Chronic radicular lumbar pain; Neural foraminal stenosis of lumbar  spine; and Chronic pain syndrome on their problem list. Today he comes in for evaluation of his Back Pain  Pain Assessment: Location: Lower Back Radiating: down back of legs to bottom of feet bilateral effects all toes, right and left the same Onset: More than a month ago Duration: Chronic pain Quality: Aching, Burning, Constant, Shooting, Sore, Sharp, Discomfort Severity: 8 /10 (subjective, self-reported pain score)  Effect on ADL: everything that I do, walking standing, yard work, lifting, ADL's Timing: Constant Modifying factors: rest, motrin BP: 118/88  HR: 75  Onset and Duration: Gradual and Present longer than 3 months Cause of pain: Unknown Severity: Getting worse, NAS-11 at its best: 10/10, and NAS-11 on the average: 9/10 Timing: Not influenced by the time of the day and After activity or exercise Aggravating Factors: Bending, Kneeling, Motion, Walking, Walking uphill, and Walking downhill Alleviating Factors: Resting, Sleeping, and Warm showers or baths Associated Problems: Night-time cramps and Erectile dysfunction Quality of Pain: Agonizing, Constant, Getting longer, Sharp, Shooting, Stabbing, and Toothache-like Previous Examinations or Tests: CT scan, MRI scan, and X-rays Previous Treatments: The patient denies na  Mr. Swamy is being evaluated for possible interventional pain management therapies for the treatment of his chronic pain.   Discussed the use of AI scribe software for clinical note transcription with the patient, who gave verbal consent to proceed.  History of Present Illness   [name] is a 78 year old male with spinal stenosis who presents with bilateral leg pain. He was referred by Hastings Hilma and the neurosurgery team for evaluation of bilateral leg pain.  He experiences pain originating in his lower back, radiating down both legs to his feet, persisting for several months without a specific triggering event. He remains active by frequently  playing golf.  He has a history of moderate spinal stenosis and foraminal stenosis at the L3, L4 level.  He is not currently on medications like gabapentin  or Lyrica for this condition.  His cardiovascular history includes atrial fibrillation, heart failure, cardiomyopathy with a defibrillator, and a history of coronary artery bypass grafting. He recently had stents placed in his heart on July 1st, 2025, and is on dual antiplatelet therapy with Eliquis  and Plavix . He has been on Eliquis  since 2016 and has not experienced atrial fibrillation recently.  He sometimes has trouble sleeping at night but reports that his kidneys are functioning well.       Historic Controlled Substance Pharmacotherapy Review   Department of public safety, offender search: Engineer, mining Information) Non-contributory Risk Assessment Profile: Aberrant behavior: None observed or detected today Risk factors for fatal opioid overdose: None identified today  Fatal overdose hazard ratio (HR): Calculation deferred Non-fatal overdose hazard ratio (HR): Calculation deferred Risk of opioid abuse or dependence: 0.7-3.0% with doses <= 36 MME/day and 6.1-26% with doses >= 120 MME/day. Substance use disorder (SUD) risk level: See below Personal History of Substance Abuse (SUD-Substance use disorder):  Alcohol: Negative  Illegal Drugs: Negative  Rx Drugs: Negative  ORT Risk Level calculation: Low Risk  Opioid Risk Tool - 09/20/23 0936       Personal History of Substance Abuse   Alcohol Negative    Illegal Drugs Negative    Rx Drugs Negative      Age   Age between 16-45 years  No      Psychological Disease   Psychological Disease Negative    Depression Negative      Total Score   Opioid Risk Tool Scoring 0    Opioid Risk Interpretation Low Risk         ORT Scoring interpretation table:  Score <3 = Low Risk for SUD  Score between 4-7 = Moderate Risk for SUD  Score >8 = High Risk for Opioid Abuse   PHQ-2  Depression Scale:  Total score:    PHQ-2 Scoring interpretation table: (Score and probability of major depressive disorder)  Score 0 = No depression  Score 1 = 15.4% Probability  Score 2 = 21.1% Probability  Score 3 = 38.4% Probability  Score 4 = 45.5% Probability  Score 5 = 56.4% Probability  Score 6 = 78.6% Probability   PHQ-9 Depression Scale:  Total score:    PHQ-9 Scoring interpretation table:  Score 0-4 = No depression  Score 5-9 = Mild depression  Score 10-14 = Moderate depression  Score 15-19 = Moderately severe depression  Score 20-27 = Severe depression (2.4 times higher risk of SUD and 2.89 times higher risk of overuse)   Pharmacologic Plan: As per protocol, I have not taken over any controlled substance management, pending the results of ordered tests and/or consults.            Initial impression: Pending review of available data and ordered tests.  Meds   Current Outpatient Medications:    albuterol  (VENTOLIN  HFA) 108 (90 Base) MCG/ACT inhaler, Inhale 2 puffs into the lungs every 6 (six) hours as needed for wheezing or shortness of breath., Disp: 8 g, Rfl: 3   clopidogrel  (PLAVIX ) 75 MG tablet, Take 1 tablet (75 mg total) by mouth daily., Disp: 90 tablet, Rfl: 3   Continuous Glucose Sensor (FREESTYLE LIBRE 3 PLUS SENSOR) MISC, Use to check blood sugar continuously. Change sensor every 15 days., Disp: 6 each, Rfl: 1   dapagliflozin  propanediol (FARXIGA ) 10 MG TABS tablet, Take 1 tablet (10 mg total) by mouth daily before breakfast. For diabetes.,  Disp: 90 tablet, Rfl: 1   ELIQUIS  5 MG TABS tablet, TAKE 1 TABLET BY MOUTH TWICE A DAY, Disp: 60 tablet, Rfl: 5   Evolocumab  (REPATHA  SURECLICK) 140 MG/ML SOAJ, Inject 140 mg into the skin every 14 (fourteen) days., Disp: 6 mL, Rfl: 3   gabapentin  (NEURONTIN ) 100 MG capsule, Take 1 capsule (100 mg total) by mouth 3 (three) times daily for 30 days, THEN 2 capsules (200 mg total) 3 (three) times daily., Disp: 270 capsule, Rfl: 0    ibuprofen (ADVIL) 200 MG tablet, Take 200 mg by mouth every 8 (eight) hours as needed., Disp: , Rfl:    insulin  aspart (NOVOLOG  FLEXPEN) 100 UNIT/ML FlexPen, Inject 20 Units into the skin 3 (three) times daily with meals., Disp: , Rfl:    insulin  degludec (TRESIBA  FLEXTOUCH) 200 UNIT/ML FlexTouch Pen, Inject 100 Units into the skin daily. Via [employer] PAP, Disp: 36 mL, Rfl: 0   Insulin  Pen Needle 31G X 8 MM MISC, Use as directed with Lantus ., Disp: 100 each, Rfl: 5   metoprolol  succinate (TOPROL -XL) 50 MG 24 hr tablet, Take 1 tablet (50 mg total) by mouth in the morning and at bedtime. Take with or immediately following a meal., Disp: 180 tablet, Rfl: 3   nitroGLYCERIN  (NITROSTAT ) 0.4 MG SL tablet, Place 1 tablet (0.4 mg total) under the tongue every 5 (five) minutes as needed for chest pain., Disp: 25 tablet, Rfl: 6   ondansetron  (ZOFRAN -ODT) 4 MG disintegrating tablet, Take 1 tablet (4 mg total) by mouth every 8 (eight) hours as needed for nausea or vomiting., Disp: 15 tablet, Rfl: 0   OVER THE COUNTER MEDICATION, Place 1 drop into both eyes 2 (two) times daily. Happy body  liquid msm eye drops/ for floaters in the eyes, Disp: , Rfl:    rosuvastatin  (CRESTOR ) 10 MG tablet, Take 1 tablet (10 mg total) by mouth daily., Disp: 90 tablet, Rfl: 3   sacubitril-valsartan (ENTRESTO ) 24-26 MG, TAKE 1 TABLET BY MOUTH TWICE A DAY, Disp: 180 tablet, Rfl: 3   Semaglutide , 1 MG/DOSE, 4 MG/3ML SOPN, Inject 1 mg as directed once a week. for diabetes., Disp: , Rfl:    Spacer/Aero-Hold Chamber Bags MISC, 1 Bag by Does not apply route as needed., Disp: 1 Bag, Rfl: 3   tamsulosin  (FLOMAX ) 0.4 MG CAPS capsule, TAKE 1 CAPSULE (0.4 MG TOTAL) BY MOUTH DAILY. FOR URINE FLOW., Disp: 90 capsule, Rfl: 3  Imaging Review   MR LUMBAR SPINE WO CONTRAST  Narrative EXAM: MRI LUMBAR SPINE 09/04/2023 11:17:28 AM  TECHNIQUE: Multiplanar multisequence MRI of the lumbar spine was performed without the administration of  intravenous contrast.  COMPARISON: Lumbar spine series dated 11/14/2021.  CLINICAL HISTORY: Lumbar radiculopathy, symptoms persist with > 6 wks treatment. Is a 78 year old male with bilateral chronic lower back pain with bilateral lower extremity sciatica. X-ray completed in 2023 with multiple level degenerative changes.  FINDINGS:  BONES AND ALIGNMENT: Slight degenerative anterolisthesis at L3-4. Focal fatty foci within the vertebral bodies throughout the visualized thoracolumbar spine.  SPINAL CORD: The conus medullaris terminates at the mid body of L1.  SOFT TISSUES: No paraspinal mass.  L1-L2: No significant disc herniation. No spinal canal stenosis or neural foraminal narrowing.  L2-L3: Mild diffuse disc bulging causing mild central spinal canal stenosis and mild bilateral lateral recess stenosis.  L3-L4: Degenerative anterolisthesis and mild facet hypertrophy, causing mild-to-moderate central spinal canal stenosis and mild-to-moderate bilateral lateral recess stenosis. There is no apparent nerve root impingement.  L4-L5: Small  bilateral lateral recess stenosis.  L5-S1: Mild central disc bulge causing mild central spinal canal stenosis. The neural foramina are widely patent.  IMPRESSION: 1. Mild-to-moderate central spinal canal stenosis and mild-to-moderate bilateral lateral recess stenosis at L3-4 due to degenerative anterolisthesis and mild facet hypertrophy. No apparent nerve root impingement. 2. Mild central spinal canal stenosis and mild bilateral lateral recess stenosis at L2-3 due to mild diffuse disc bulging.  Electronically signed by: Evalene Coho MD 09/04/2023 12:50 PM EDT RP Workstation: [id number]  DG Lumbar Spine Complete  Narrative CLINICAL DATA:  Chronic back pain.  EXAM: LUMBAR SPINE - COMPLETE 4+ VIEW  COMPARISON:  No recent prior.  FINDINGS: Surgical clips right upper quadrant. Lumbar spine numbered the lowest segmented  appearing lumbar shaped vertebrae on lateral view as L5. Diffuse multilevel disc degeneration and endplate osteophyte formation. Lucency noted along the posterior aspect of spinous process of L2, most likely related to overlying soft tissues. A fracture, possibly old, of the spinous process of L2 cannot be completely excluded. No acute bony abnormality otherwise identified. Aortoiliac atherosclerotic vascular calcification.  IMPRESSION: 1. Diffuse multilevel degenerative change. A lucency is noted over the posterior aspect of the L2 spinous process, most likely related to overlying soft tissues. A fracture, possibly old, of the spinous process of L2 cannot be completely excluded. No acute bony abnormality otherwise identified. 2. Aortoiliac atherosclerotic vascular disease.   Electronically Signed By: Debby  Register M.D. On: 11/15/2021 10:55   Complexity Note: Imaging results reviewed.                         ROS  Cardiovascular: Heart trouble, Abnormal heart rhythm, High blood pressure, Chest pain, Heart attack ( Date: 08/2015), Heart surgery, Pacemaker or defibrillator, Heart failure, Weak heart (CHF), Heart murmur, Heart catheterization, and Blood thinners:  Antiplatelet Pulmonary or Respiratory: Shortness of breath and Coughing up mucus (Bronchitis) Neurological: No reported neurological signs or symptoms such as seizures, abnormal skin sensations, urinary and/or fecal incontinence, being born with an abnormal open spine and/or a tethered spinal cord Psychological-Psychiatric: No reported psychological or psychiatric signs or symptoms such as difficulty sleeping, anxiety, depression, delusions or hallucinations (schizophrenial), mood swings (bipolar disorders) or suicidal ideations or attempts Gastrointestinal: Heartburn due to stomach pushing into lungs (Hiatal hernia) and Alternating episodes iof diarrhea and constipation (IBS-Irritable bowe syndrome) Genitourinary: No reported  renal or genitourinary signs or symptoms such as difficulty voiding or producing urine, peeing blood, non-functioning kidney, kidney stones, difficulty emptying the bladder, difficulty controlling the flow of urine, or chronic kidney disease Hematological: No reported hematological signs or symptoms such as prolonged bleeding, low or poor functioning platelets, bruising or bleeding easily, hereditary bleeding problems, low energy levels due to low hemoglobin or being anemic Endocrine: High blood sugar requiring insulin  (IDDM) Rheumatologic: No reported rheumatological signs and symptoms such as fatigue, joint pain, tenderness, swelling, redness, heat, stiffness, decreased range of motion, with or without associated rash Musculoskeletal: Negative for myasthenia gravis, muscular dystrophy, multiple sclerosis or malignant hyperthermia Work History: Retired  Allergies  Mr. Peppard is allergic to lipitor [atorvastatin ], pravastatin , and simvastatin .  Laboratory Chemistry Profile   Renal Lab Results  Component Value Date   BUN 19 07/10/2023   CREATININE 1.01 07/10/2023   BCR 19 07/10/2023   GFR 53.87 (L) 11/30/2021   GFRAA >60 01/07/2019   GFRNONAA >60 06/24/2023   PROTEINUR 30 (A) 01/05/2019     Electrolytes Lab Results  Component Value Date   NA  144 07/10/2023   K 4.1 07/10/2023   CL 104 07/10/2023   CALCIUM  9.0 07/10/2023   MG 2.3 06/23/2014     Hepatic Lab Results  Component Value Date   AST 37 06/24/2023   ALT 70 (H) 06/24/2023   ALBUMIN  3.7 06/24/2023   ALKPHOS 73 06/24/2023   LIPASE 30.0 11/30/2021     ID Lab Results  Component Value Date   SARSCOV2NAA NEGATIVE 02/10/2020   STAPHAUREUS NEGATIVE 01/06/2019   MRSAPCR NEGATIVE 01/06/2019   HCVAB NEGATIVE 03/10/2015     Bone No results found for: VD25OH, [id number], CI6874NY7, CI7874NY7, 25OHVITD1, 25OHVITD2, 25OHVITD3, TESTOFREE, TESTOSTERONE   Endocrine Lab Results  Component Value Date    GLUCOSE 157 (H) 07/10/2023   GLUCOSEU 150 (A) 01/05/2019   HGBA1C 8.9 (A) 08/08/2023   TSH 1.75 03/30/2022     Neuropathy Lab Results  Component Value Date   HGBA1C 8.9 (A) 08/08/2023     CNS No results found for: COLORCSF, APPEARCSF, RBCCOUNTCSF, WBCCSF, POLYSCSF, LYMPHSCSF, EOSCSF, PROTEINCSF, GLUCCSF, JCVIRUS, CSFOLI, IGGCSF, LABACHR, ACETBL   Inflammation (CRP: Acute  ESR: Chronic) Lab Results  Component Value Date   ESRSEDRATE 4 07/28/2011     Rheumatology No results found for: RF, ANA, LABURIC, URICUR, LYMEIGGIGMAB, LYMEABIGMQN, HLAB27   Coagulation Lab Results  Component Value Date   INR 1.35 06/22/2014   LABPROT 16.8 (H) 06/22/2014   APTT 33 01/06/2019   PLT 234 07/10/2023     Cardiovascular Lab Results  Component Value Date   BNP 407.8 (H) 06/24/2023   CKTOTAL 211 06/09/2013   TROPONINI 0.57 (HH) 08/03/2015   HGB 15.8 07/10/2023   HCT 48.4 07/10/2023     Screening Lab Results  Component Value Date   SARSCOV2NAA NEGATIVE 02/10/2020   STAPHAUREUS NEGATIVE 01/06/2019   MRSAPCR NEGATIVE 01/06/2019   HCVAB NEGATIVE 03/10/2015     Cancer No results found for: CEA, CA125, LABCA2   Allergens No results found for: ALMOND, APPLE, ASPARAGUS, AVOCADO, BANANA, BARLEY, BASIL, BAYLEAF, GREENBEAN, LIMABEAN, WHITEBEAN, BEEFIGE, REDBEET, BLUEBERRY, BROCCOLI, CABBAGE, MELON, CARROT, CASEIN, CASHEWNUT, CAULIFLOWER, CELERY     Note: Lab results reviewed.  PFSH  Drug: Mr. Corales  reports no history of drug use. Alcohol:  reports no history of alcohol use. Tobacco:  reports that he has never smoked. He has never used smokeless tobacco. Medical:  has a past medical history of Arthritis, Atrial fibrillation (HCC), Bradycardia, Chest pain, Chronic lower back pain, Coronary artery disease, Ejection fraction, Elevated CPK, Heart murmur, History of echocardiogram, Hypertension, IBS  (irritable bowel syndrome), Myocardial infarction (HCC) (06/2014), NSTEMI (non-ST elevated myocardial infarction) (HCC) (08/02/2015), Sinus drainage, Type II diabetes mellitus (HCC), and Unstable angina (HCC) (01/05/2019). Family: family history includes Alzheimer's disease in his mother; Arrhythmia in his sister; Cancer in his brother; Emphysema in his father; Heart attack in his sister; Heart disease in his sister; Hyperlipidemia in his sister; Hypertension in his sister.  Past Surgical History:  Procedure Laterality Date   APPENDECTOMY     CARDIAC CATHETERIZATION N/A 05/12/2014   Procedure: Left Heart Cath and Coronary Angiography;  Surgeon: Debby DELENA Sor, MD;  Location: [facility];  Service: Cardiovascular;  Laterality: N/A;   CARDIAC CATHETERIZATION N/A 05/13/2014   Procedure: Coronary/Graft Atherectomy;  Surgeon: Debby DELENA Sor, MD;  Location: [facility];  Service: Cardiovascular;  Laterality: N/A;   CARDIAC CATHETERIZATION Right 05/13/2014   Procedure: Temporary Pacemaker;  Surgeon: Debby DELENA Sor, MD;  Location: [facility];  Service: Cardiovascular;  Laterality: Right;   CARDIAC CATHETERIZATION N/A 05/13/2014   Procedure: Coronary Stent Intervention;  Surgeon: Debby DELENA Sor, MD;  Location: [facility];  Service: Cardiovascular;  Laterality: N/A;   CARDIAC CATHETERIZATION N/A 06/18/2014   Procedure: Left Heart Cath and Coronary Angiography;  Surgeon: Peter M Swaziland, MD;  Location: [facility] INVASIVE CV LAB;  Service: Cardiovascular;  Laterality: N/A;   CARDIAC CATHETERIZATION N/A 08/03/2015   Procedure: Left Heart Cath and Cors/Grafts Angiography;  Surgeon: Lonni Hanson, MD;  Location: [facility] INVASIVE CV LAB;  Service: Cardiovascular;  Laterality: N/A;   CARDIOVASCULAR STRESS TEST  05/06/2014   CARDIOVERSION N/A 02/21/2022   Procedure: CARDIOVERSION;  Surgeon: Hobart Powell BRAVO, MD;  Location[facility] ENDOSCOPY;  Service: Cardiovascular;  Laterality: N/A;   CATARACT  EXTRACTION W/ INTRAOCULAR LENS IMPLANT Left    COLONOSCOPY WITH PROPOFOL  N/A 02/13/2020   Procedure: COLONOSCOPY WITH PROPOFOL ;  Surgeon: Rollin Dover, MD;  Location: [facility] ENDOSCOPY;  Service: Endoscopy;  Laterality: N/A;   CORONARY ARTERY BYPASS GRAFT N/A 06/22/2014   Procedure: CORONARY ARTERY BYPASS GRAFTING (CABG) x five, using left internal mammary artery, and right leg greater saphenous vein harvested endoscopically;  Surgeon: Elspeth JAYSON Millers, MD;  Location: [facility] OR;  Service: Open Heart Surgery;  Laterality: N/A;   CORONARY STENT INTERVENTION N/A 07/03/2023   Procedure: CORONARY STENT INTERVENTION;  Surgeon: Elmira Newman PARAS, MD;  Location: [facility];  Service: Cardiovascular;  Laterality: N/A;   ICD IMPLANT N/A 01/07/2019   Procedure: ICD IMPLANT;  Surgeon: Kelsie Agent, MD;  Location: [facility];  Service: Cardiovascular;  Laterality: N/A;   LAPAROSCOPIC CHOLECYSTECTOMY     LEFT HEART CATH AND CORS/GRAFTS ANGIOGRAPHY N/A 01/06/2019   Procedure: LEFT HEART CATH AND CORS/GRAFTS ANGIOGRAPHY;  Surgeon: Hanson Lonni, MD;  Location: [facility];  Service: Cardiovascular;  Laterality: N/A;   LEFT HEART CATH AND CORS/GRAFTS ANGIOGRAPHY N/A 07/03/2023   Procedure: LEFT HEART CATH AND CORS/GRAFTS ANGIOGRAPHY;  Surgeon: Elmira Newman PARAS, MD;  Location: [facility];  Service: Cardiovascular;  Laterality: N/A;   MAZE N/A 06/22/2014   Procedure: MAZE;  Surgeon: Elspeth JAYSON Millers, MD;  Location: [facility] OR;  Service: Open Heart Surgery;  Laterality: N/A;   POLYPECTOMY  02/13/2020   Procedure: POLYPECTOMY;  Surgeon: Rollin Dover, MD;  Location: [facility] ENDOSCOPY;  Service: Endoscopy;;   TEE WITHOUT CARDIOVERSION N/A 06/22/2014   Procedure: TRANSESOPHAGEAL ECHOCARDIOGRAM (TEE);  Surgeon: Elspeth JAYSON Millers, MD;  Location: [facility] OR;  Service: Open Heart Surgery;  Laterality: N/A;   Active Ambulatory Problems    Diagnosis Date Noted   DM type 2 (diabetes mellitus,  type 2) (HCC) 01/21/2009   Paroxysmal atrial fibrillation (HCC) 01/21/2009   BPH (benign prostatic hyperplasia) 03/11/2011   IBS (irritable bowel syndrome) 03/11/2011   Bursitis of hip 03/11/2011   Hypertension    Rotator cuff tendinitis 07/28/2011   NASH (nonalcoholic steatohepatitis) 09/06/2011   Hyperlipidemia 04/21/2013   CAD (coronary artery disease) 05/12/2014   Junctional bradycardia    Cardiomyopathy, ischemic    Nonsustained ventricular tachycardia (HCC) 06/23/2014   Hx of CABG 07/19/2014   Insomnia 07/21/2014   Allergic rhinitis 06/21/2018   Syncope 04/11/2019   ICD (implantable cardioverter-defibrillator) in place 04/11/2019   Preventative health care 03/29/2022   HFrEF (heart failure with reduced ejection fraction) (HCC) 02/27/2023   Chronic bilateral low back pain with bilateral sciatica 08/08/2023   Spinal stenosis, lumbar region, with neurogenic claudication 09/20/2023   Chronic radicular lumbar pain 09/20/2023  Neural foraminal stenosis of lumbar spine 09/20/2023   Chronic pain syndrome 09/20/2023   Resolved Ambulatory Problems    Diagnosis Date Noted   Overweight 03/11/2011   Elevated CPK    Ejection fraction    Sinus bradycardia 05/13/2014   HX: anticoagulation 07/21/2014   Toe pain 02/25/2015   NSTEMI (non-ST elevated myocardial infarction) (HCC) 08/02/2015   Acute renal insufficiency 08/04/2015   URI (upper respiratory infection) 01/09/2018   Diarrhea 12/26/2018   Unstable angina (HCC) 01/05/2019   Nausea and vomiting 11/30/2021   Epigastric abdominal pain 11/30/2021   Past Medical History:  Diagnosis Date   Arthritis    Atrial fibrillation (HCC)    Bradycardia    Chest pain    Chronic lower back pain    Coronary artery disease    Heart murmur    History of echocardiogram    Myocardial infarction (HCC) 06/2014   Sinus drainage    Type II diabetes mellitus (HCC)    Constitutional Exam  General appearance: Well nourished, well developed, and  well hydrated. In no apparent acute distress Vitals:   09/20/23 0925  BP: 118/88  Pulse: 75  Resp: 16  Temp: 98.1 F (36.7 C)  SpO2: 100%  Weight: 210 lb (95.3 kg)  Height: 5' 8 (1.727 m)   BMI Assessment: Estimated body mass index is 31.93 kg/m as calculated from the following:   Height as of this encounter: 5' 8 (1.727 m).   Weight as of this encounter: 210 lb (95.3 kg).  BMI interpretation table: BMI level Category Range association with higher incidence of chronic pain  <18 kg/m2 Underweight   18.5-24.9 kg/m2 Ideal body weight   25-29.9 kg/m2 Overweight Increased incidence by 20%  30-34.9 kg/m2 Obese (Class I) Increased incidence by 68%  35-39.9 kg/m2 Severe obesity (Class II) Increased incidence by 136%  >40 kg/m2 Extreme obesity (Class III) Increased incidence by 254%   Patient's current BMI Ideal Body weight  Body mass index is 31.93 kg/m. Ideal body weight: 68.4 kg (150 lb 12.7 oz) Adjusted ideal body weight: 79.1 kg (174 lb 7.6 oz)   BMI Readings from Last 4 Encounters:  09/20/23 31.93 kg/m  09/05/23 32.16 kg/m  08/08/23 31.93 kg/m  08/07/23 31.93 kg/m   Wt Readings from Last 4 Encounters:  09/20/23 210 lb (95.3 kg)  09/05/23 211 lb 8 oz (95.9 kg)  08/08/23 210 lb (95.3 kg)  08/07/23 210 lb (95.3 kg)    Psych/Mental status: Alert, oriented x 3 (person, place, & time)       Eyes: PERLA Respiratory: No evidence of acute respiratory distress  Lumbar Spine Area Exam  Skin & Axial Inspection: No masses, redness, or swelling Alignment: Symmetrical Functional ROM: Pain restricted ROM       Stability: No instability detected Muscle Tone/Strength: Functionally intact. No obvious neuro-muscular anomalies detected. Sensory (Neurological): Dermatomal pain pattern Palpation: No palpable anomalies       Provocative Tests: Hyperextension/rotation test: deferred today       Lumbar quadrant test (Kemp's test): deferred today       Lateral bending test:  deferred today       Patrick's Maneuver: deferred today                   FABER* test: deferred today                   S-I anterior distraction/compression test: deferred today         S-I lateral  compression test: deferred today         S-I Thigh-thrust test: deferred today         S-I Gaenslen's test: deferred today         *(Flexion, ABduction and External Rotation) Gait & Posture Assessment  Ambulation: Unassisted Gait: Relatively normal for age and body habitus Posture: WNL  Lower Extremity Exam    Side: Right lower extremity  Side: Left lower extremity  Stability: No instability observed          Stability: No instability observed          Skin & Extremity Inspection: Skin color, temperature, and hair growth are WNL. No peripheral edema or cyanosis. No masses, redness, swelling, asymmetry, or associated skin lesions. No contractures.  Skin & Extremity Inspection: Skin color, temperature, and hair growth are WNL. No peripheral edema or cyanosis. No masses, redness, swelling, asymmetry, or associated skin lesions. No contractures.  Functional ROM: Pain restricted ROM                  Functional ROM: Pain restricted ROM                  Muscle Tone/Strength: Functionally intact. No obvious neuro-muscular anomalies detected.  Muscle Tone/Strength: Functionally intact. No obvious neuro-muscular anomalies detected.  Sensory (Neurological): Neurogenic pain pattern        Sensory (Neurological): Neurogenic pain pattern        DTR: Patellar: deferred today Achilles: deferred today Plantar: deferred today  DTR: Patellar: deferred today Achilles: deferred today Plantar: deferred today  Palpation: No palpable anomalies  Palpation: No palpable anomalies    Assessment  Primary Diagnosis & Pertinent Problem List: The primary encounter diagnosis was Spinal stenosis, lumbar region, with neurogenic claudication. Diagnoses of Chronic radicular lumbar pain, Hx of CABG, Coronary artery disease  involving native coronary artery of native heart without angina pectoris (stent 07/03/23), Neural foraminal stenosis of lumbar spine, and Chronic pain syndrome were also pertinent to this visit.  Visit Diagnosis (New problems to examiner): 1. Spinal stenosis, lumbar region, with neurogenic claudication   2. Chronic radicular lumbar pain   3. Hx of CABG   4. Coronary artery disease involving native coronary artery of native heart without angina pectoris (stent 07/03/23)   5. Neural foraminal stenosis of lumbar spine   6. Chronic pain syndrome    Plan of Care (Initial workup plan)  Note: Mr. [name] was reminded that as per protocol, today's visit has been an evaluation only. We have not taken over the patient's controlled substance management.  Problem-specific plan: Assessment and Plan    Lumbar spinal stenosis with neurogenic claudication and foraminal stenosis at L3-L4 with chronic low back pain and bilateral lower extremity radiculopathy   Chronic low back pain radiates to bilateral lower extremities, consistent with lumbar spinal stenosis and foraminal stenosis at L3-L4. Pain worsens with standing and improves with bending forward. Pain management is limited due to dual antiplatelet therapy after recent coronary stent placement. Injections are contraindicated due to bleeding risk from anticoagulation therapy. Gabapentin  is initiated to manage pain until spinal injections can be safely performed. Start gabapentin  100 mg at night for 1-2 days, then increase to 100 mg twice daily for 1-2 days, and finally to 100 mg three times daily. If tolerated, increase to 200 mg three times daily in the second month. Monitor for side effects such as sedation, mood changes, and leg swelling. Obtain urine specimen for new patient screening. Consider stronger pain  medication if cardiologist advises continuation of dual antiplatelet therapy beyond six months.  Coronary artery disease status post stent placement (July  2023) on dual antiplatelet therapy   Coronary artery disease with recent stent placement on July 02, 2021, requires dual antiplatelet therapy with Eliquis  and Plavix  to prevent stent thrombosis. Discontinuation of these medications is contraindicated within the first six months post-stent placement due to high risk of stent closure. Cardiologist consultation is required to determine if therapy can be safely interrupted for spinal procedures. Discuss with cardiologist the possibility of stopping Plavix  for 7 days and Eliquis  for 3 days to allow for spinal injections, with aspirin  81 mg permissible during this period. Ensure cardiologist provides a clear timeline for when dual antiplatelet therapy can be safely interrupted for spinal procedures.       Lab Orders         Compliance Drug Analysis, Ur     Pharmacotherapy (current): Medications ordered:  Meds ordered this encounter  Medications   gabapentin  (NEURONTIN ) 100 MG capsule    Sig: Take 1 capsule (100 mg total) by mouth 3 (three) times daily for 30 days, THEN 2 capsules (200 mg total) 3 (three) times daily.    Dispense:  270 capsule    Refill:  0   Medications administered during this visit: Hildred A. Duwaine Kava had no medications administered during this visit.    Provider-requested follow-up: Return in about 4 weeks (around 10/18/2023) for 2nd visit - discuss Gabapentin  and DAPT.  Future Appointments  Date Time Provider Department Center  10/08/2023  7:45 AM [facility] DEVICE REMOTES [facility]  10/18/2023 10:20 AM Marcelino Nurse, MD [facility] None  12/05/2023 11:00 AM Hilma Hastings, PA-C [facility] None  01/07/2024  7:45 AM [facility] DEVICE REMOTES [facility]  01/14/2024  3:40 PM [facility] ANNUAL WELLNESS VISIT 1 [facility] [address]  04/07/2024  7:45 AM [facility] DEVICE REMOTES [facility]   I discussed the assessment and treatment plan with the patient. The patient was provided an opportunity to ask questions and all were answered. The  patient agreed with the plan and demonstrated an understanding of the instructions.  Patient advised to call back or seek an in-person evaluation if the symptoms or condition worsens.  I personally spent a total of in the care of the patient today including preparing to see the patient, getting/reviewing separately obtained history, performing a medically appropriate exam/evaluation, counseling and educating, placing orders, and documenting clinical information in the EHR.   Note by: Nurse Marcelino, MD (TTS and AI technology used. I apologize for any typographical errors that were not detected and corrected.) Date: 09/20/2023; Time: 10:58 AM

## 2023-09-21 NOTE — Telephone Encounter (Signed)
 [name] See his question about holding [name]  and [name] . He just had PCI to LCx and S-PDA in July. He was not put on ASA b/c he is on [name] . Any thoughts? [address] [place], [place]    09/21/2023 1:27 PM

## 2023-09-24 LAB — COMPLIANCE DRUG ANALYSIS, UR

## 2023-09-26 NOTE — Telephone Encounter (Signed)
 Small sized stents were generally carries a high risk of stent thrombosis with early discontinuation.  That said, stents were placed in stable angina setting.  For quality of life reasons, I am okay with 3 months of antiplatelet therapy before having to interrupt it for procedure.  If possible, could the patient delay the procedure until late October/early November?  In that case, I am okay to hold Plavix  for 3-5 days before the procedure and resume it as soon as possible.  Then later he does it, the lower will be the risk of stent thrombosis.  Hope that helps.  Thanks MJP

## 2023-09-28 NOTE — Telephone Encounter (Signed)
 Jeffrie Oneil BROCKS, MD to Me  Elmira Newman PARAS, MD    09/26/23  4:12 PM Per guidelines, he should wait 6 months prior to holding his Eliquis  and Plavix .  Unfortunately he would be at increased risk for stent thrombosis. I will forward as well to Dr. Elmira, who placed the stents, to weigh in.

## 2023-10-01 NOTE — Addendum Note (Signed)
 Encounter addended by: Thayne Consuelo DEL on: 10/01/2023 9:32 AM  Actions taken: Imaging Exam ended, Charge Capture section accepted

## 2023-10-02 NOTE — Telephone Encounter (Signed)
 PharmD Please address how long pt should hold Eliquis  for epidural steroid injection and route back to me. Glendia Ferrier, PA-C    10/02/2023 5:04 PM

## 2023-10-04 ENCOUNTER — Telehealth: Payer: Self-pay | Admitting: Student in an Organized Health Care Education/Training Program

## 2023-10-04 NOTE — Telephone Encounter (Signed)
 PT called stated that he need a form to be send to Dr. Glendia Ferrier. Please give patient a call.TY

## 2023-10-04 NOTE — Telephone Encounter (Signed)
 Clearance request sent to Cardiology- [facility] [place].

## 2023-10-05 ENCOUNTER — Telehealth (HOSPITAL_BASED_OUTPATIENT_CLINIC_OR_DEPARTMENT_OTHER): Payer: Self-pay | Admitting: *Deleted

## 2023-10-05 NOTE — Telephone Encounter (Signed)
   Pre-operative Risk Assessment    Patient Name: [name]  DOB: 1945-03-01 MRN: [id number]   Date of last office visit: 08/07/23 GLENDIA FERRIER, [facility]  Date of next office visit: NONE   Request for Surgical Clearance    Procedure:  LUMBAR EPIDURAL STEROID INJECTION  Date of Surgery:  Clearance 09/20/23                                Surgeon:  DR. MARCELINO Surgeon's Group or Practice Name:  [facility] Phone number:  [phone] Fax number:  [phone]   Type of Clearance Requested:   - Medical  - Pharmacy:  Hold Clopidogrel  (Plavix ) and Apixaban  (Eliquis ) PER FORM PLAVIX  HOLD x 7 DAYS PRIOR AND ELIQUIS  HOLD x 3 DAYS; PT MAY STAY ON ASA    Type of Anesthesia:  Not Indicated   Additional requests/questions:    Bonney Niels Jest   10/05/2023, 12:42 PM

## 2023-10-08 ENCOUNTER — Ambulatory Visit (INDEPENDENT_AMBULATORY_CARE_PROVIDER_SITE_OTHER): Payer: Medicare Other

## 2023-10-08 DIAGNOSIS — I480 Paroxysmal atrial fibrillation: Secondary | ICD-10-CM | POA: Diagnosis not present

## 2023-10-09 NOTE — Telephone Encounter (Signed)
   [name] is a 78 y.o. male with a hx of CABG in 2016 and recent PCI with DES to the LCx and S-RPDA 07/03/23, (HFrEF) heart failure with reduced ejection fraction, paroxysmal atrial fibrillation. He was last seen in follow up 08/07/23. He currently takes Clopidogrel  and Eliquis .  Pt needs ESI. Case reviewed with Dr. Jeffrie his cardiologist and Dr. Elmira (interventional cardiologist who did his recent PCI). Ideally the patient should remain on antiplatelet therapy w/o interruption for 6 mos after PCI done for stable ischemic heart disease. Dr. Elmira noted that, small sized stents generally carry a high risk of stent thrombosis with early discontinuation (of antiplatelet therapy). That said, stents were placed in stable angina setting. For quality of life reasons, I am okay with 3 months of antiplatelet therapy before having to interrupt it for procedure. If possible, could the patient delay the procedure until late October/early November? In that case, I am okay to hold Plavix  for 3-5 days before the procedure and resume it as soon as possible. The later he does it, the lower will be the risk of stent thrombosis.  This was discussed with the patient and he is comfortable with proceeding in early November.  Clopidogrel  should not be held more than 5 days.  Case reviewed with our PharmD. Per protocol, Eliquis  can be held for 3 days.  PLAN:  -Ok to proceed with ESI anytime after the first week of November. -Patient can hold Clopidogrel  (Plavix ) for 5 days and resume after the procedure when felt to be safe.  -Patient can hold Eliquis  for 3 days prior to procedure and resume as soon as possible after when felt to be safe.  -If there is no concern for bleeding with ASA, would recommend taking ASA 81 mg once daily while off of Clopidogrel . Then stop ASA when Clopidogrel  is resumed.  Glendia Ferrier, PA-C    10/09/2023 4:26 PM

## 2023-10-10 ENCOUNTER — Other Ambulatory Visit: Payer: Self-pay | Admitting: Student in an Organized Health Care Education/Training Program

## 2023-10-10 DIAGNOSIS — R0981 Nasal congestion: Secondary | ICD-10-CM | POA: Diagnosis not present

## 2023-10-10 DIAGNOSIS — M48062 Spinal stenosis, lumbar region with neurogenic claudication: Secondary | ICD-10-CM

## 2023-10-10 DIAGNOSIS — R059 Cough, unspecified: Secondary | ICD-10-CM | POA: Diagnosis not present

## 2023-10-10 DIAGNOSIS — G8929 Other chronic pain: Secondary | ICD-10-CM

## 2023-10-10 LAB — CUP PACEART REMOTE DEVICE CHECK
Battery Remaining Longevity: 93 mo
Battery Voltage: 2.99 V
Brady Statistic RV Percent Paced: 0.04 %
Date Time Interrogation Session: 20251006033324
HighPow Impedance: 70 Ohm
Implantable Lead Connection Status: 753985
Implantable Lead Implant Date: 20210105
Implantable Lead Location: 753860
Implantable Pulse Generator Implant Date: 20210105
Lead Channel Impedance Value: 380 Ohm
Lead Channel Impedance Value: 437 Ohm
Lead Channel Pacing Threshold Amplitude: 0.5 V
Lead Channel Pacing Threshold Pulse Width: 0.4 ms
Lead Channel Sensing Intrinsic Amplitude: 9.875 mV
Lead Channel Sensing Intrinsic Amplitude: 9.875 mV
Lead Channel Setting Pacing Amplitude: 2.5 V
Lead Channel Setting Pacing Pulse Width: 0.4 ms
Lead Channel Setting Sensing Sensitivity: 0.3 mV
Zone Setting Status: 755011
Zone Setting Status: 755011

## 2023-10-10 NOTE — Progress Notes (Signed)
 Have received anticoagulation plan via cardiology. See nursing notes. Plan for L3/4 ESI in November

## 2023-10-10 NOTE — Telephone Encounter (Signed)
 SABRA

## 2023-10-11 NOTE — Progress Notes (Signed)
 Remote ICD Transmission

## 2023-10-15 ENCOUNTER — Ambulatory Visit: Payer: Self-pay | Admitting: Cardiology

## 2023-10-15 NOTE — Progress Notes (Signed)
 Remote ICD Transmission

## 2023-10-18 ENCOUNTER — Encounter: Payer: Self-pay | Admitting: Student in an Organized Health Care Education/Training Program

## 2023-10-18 ENCOUNTER — Ambulatory Visit
Attending: Student in an Organized Health Care Education/Training Program | Admitting: Student in an Organized Health Care Education/Training Program

## 2023-10-18 VITALS — BP 145/72 | HR 67 | Temp 97.9°F | Resp 16 | Ht 68.0 in | Wt 210.0 lb

## 2023-10-18 DIAGNOSIS — G894 Chronic pain syndrome: Secondary | ICD-10-CM | POA: Diagnosis not present

## 2023-10-18 DIAGNOSIS — G8929 Other chronic pain: Secondary | ICD-10-CM | POA: Diagnosis not present

## 2023-10-18 DIAGNOSIS — M5416 Radiculopathy, lumbar region: Secondary | ICD-10-CM | POA: Diagnosis not present

## 2023-10-18 DIAGNOSIS — M48062 Spinal stenosis, lumbar region with neurogenic claudication: Secondary | ICD-10-CM | POA: Diagnosis not present

## 2023-10-18 MED ORDER — GABAPENTIN 100 MG PO CAPS: 200.0000 mg | ORAL_CAPSULE | Freq: Three times a day (TID) | ORAL | 0 refills | Status: AC

## 2023-10-18 NOTE — Progress Notes (Signed)
 PROVIDER NOTE: Interpretation of information contained herein should be left to medically-trained personnel. Specific patient instructions are provided elsewhere under Patient Instructions section of medical record. This document was created in part using AI and STT-dictation technology, any transcriptional errors that may result from this process are unintentional.  Patient: [name]  Service: E/M   PCP: Gretta Comer POUR, NP  DOB: 1945/04/13  DOS: 10/18/2023  Provider: Wallie Sherry, MD  MRN: [id number]  Delivery: Face-to-face  Specialty: Interventional Pain Management  Type: Established Patient  Setting: Ambulatory outpatient facility  Specialty designation: 09  Referring Prov.: Gretta Comer POUR, NP  Location: Outpatient office facility       History of present illness (HPI) [name], a 78 y.o. year old male, is here today because of his Chronic radicular lumbar pain [M54.16, G89.29]. Mr. Noboa primary complain today is Back Pain  Pertinent problems: Mr. Ramcharan does not have any pertinent problems on file.  Pain Assessment: Severity of Chronic pain is reported as a 6 /10. Location: Back Lower/down back of legs to feet/toes bilat. Onset: More than a month ago. Quality: Aching, Burning, Constant, Shooting, Sharp, Sore, Discomfort. Timing: Constant. Modifying factor(s): rest, motrin, gabapentin  helps some. Vitals:  height is 5' 8 (1.727 m) and weight is 210 lb (95.3 kg). His temperature is 97.9 F (36.6 C). His blood pressure is 145/72 (abnormal) and his pulse is 67. His respiration is 16 and oxygen saturation is 100%.  BMI: Estimated body mass index is 31.93 kg/m as calculated from the following:   Height as of this encounter: 5' 8 (1.727 m).   Weight as of this encounter: 210 lb (95.3 kg).  Last encounter: 09/20/2023. Last procedure: Visit date not found.  Reason for encounter:   Discussed the use of AI scribe software for clinical note transcription with the patient,  who gave verbal consent to proceed.  Patient presents today for medication management evaluation he is finding benefit with gabapentin  at 100 mg 3 times a day and is not noticing any side effects.  We discussed increasing his dose to 200 mg 3 times daily.  He is also going cardiac clearance from his allergist to discontinue Eliquis  5 days prior, Eliquis  3 days prior and to take aspirin  5 days prior to his ESI.  We will plan for an L3-L4 lumbar epidural steroid injection for his lumbar spinal stenosis and associated radicular pain.    HPI from initial clinic visit 09/20/23 [name] is a 78 year old male with spinal stenosis who presents with bilateral leg pain. He was referred by Glade Boys and the neurosurgery team for evaluation of bilateral leg pain.   He experiences pain originating in his lower back, radiating down both legs to his feet, persisting for several months without a specific triggering event. He remains active by frequently playing golf.   He has a history of moderate spinal stenosis and foraminal stenosis at the L3, L4 level. He is not currently on medications like gabapentin  or Lyrica for this condition.   His cardiovascular history includes atrial fibrillation, heart failure, cardiomyopathy with a defibrillator, and a history of coronary artery bypass grafting. He recently had stents placed in his heart on July 1st, 2025, and is on dual antiplatelet therapy with Eliquis  and Plavix . He has been on Eliquis  since 2016 and has not experienced atrial fibrillation recently.   He sometimes has trouble sleeping at night but reports that his kidneys are functioning well.  Effectiveness: Clinically acceptable.  Dayna Pulling, RN  10/18/2023 10:57 AM  Sign when Signing Visit Safety precautions to be maintained throughout the outpatient stay will include: orient to surroundings, keep bed in low position, maintain call bell within reach at all times, provide assistance with  transfer out of bed and ambulation.   Pt given application for disability placard signed by Dr. Marcelino.    UDS:  Summary  Date Value Ref Range Status  09/20/2023 FINAL  Final    Comment:    ==================================================================== Compliance Drug Analysis, Ur ==================================================================== Test                             Result       Flag       Units  Drug Present and Declared for Prescription Verification   Metoprolol                      PRESENT      EXPECTED  Drug Absent but Declared for Prescription Verification   Gabapentin                      Not Detected UNEXPECTED   Ibuprofen                      Not Detected UNEXPECTED    Ibuprofen, as indicated in the declared medication list, is not    always detected even when used as directed.  ==================================================================== Test                      Result    Flag   Units      Ref Range   Creatinine              171              mg/dL      >=79 ==================================================================== Declared Medications:  The flagging and interpretation on this report are based on the  following declared medications.  Unexpected results may arise from  inaccuracies in the declared medications.   **Note: The testing scope of this panel includes these medications:   Gabapentin  (Neurontin )  Metoprolol  (Toprol )   **Note: The testing scope of this panel does not include small to  moderate amounts of these reported medications:   Ibuprofen (Advil)   **Note: The testing scope of this panel does not include the  following reported medications:   Albuterol  (Ventolin  HFA)  Apixaban  (Eliquis )  Clopidogrel  (Plavix )  Dapagliflozin  (Farxiga )  Evolocumab  (Repatha )  Eye Drops  Insulin  (NovoLog )  Nitroglycerin  (Nitrostat )  Ondansetron  (Zofran )  Rosuvastatin  (Crestor )  Sacubitril (Entresto )  Semaglutide   Tamsulosin   (Flomax )  Valsartan (Entresto ) ==================================================================== For clinical consultation, please call [phone]. ====================================================================     No results found for: CBDTHCR No results found for: D8THCCBX No results found for: D9THCCBX  ROS  Constitutional: Denies any fever or chills Gastrointestinal: No reported hemesis, hematochezia, vomiting, or acute GI distress Musculoskeletal: Denies any acute onset joint swelling, redness, loss of ROM, or weakness Neurological: No reported episodes of acute onset apraxia, aphasia, dysarthria, agnosia, amnesia, paralysis, loss of coordination, or loss of consciousness  Medication Review  Evolocumab , FreeStyle Libre 3 Plus Sensor, Insulin  Pen Needle, OVER THE COUNTER MEDICATION, Semaglutide  (1 MG/DOSE), Spacer/Aero-Hold Chamber Bags, albuterol , apixaban , clopidogrel , dapagliflozin  propanediol, gabapentin , ibuprofen, insulin  aspart, insulin  degludec, metoprolol  succinate, nitroGLYCERIN , ondansetron , rosuvastatin , sacubitril-valsartan, and tamsulosin   History Review  Allergy:  Mr. Loman is allergic to lipitor [atorvastatin ], pravastatin , and simvastatin . Drug: Mr. Melander  reports no history of drug use. Alcohol:  reports no history of alcohol use. Tobacco:  reports that he has never smoked. He has never used smokeless tobacco. Social: Mr. Rubenstein  reports that he has never smoked. He has never used smokeless tobacco. He reports that he does not drink alcohol and does not use drugs. Medical:  has a past medical history of Arthritis, Atrial fibrillation (HCC), Bradycardia, Chest pain, Chronic lower back pain, Coronary artery disease, Ejection fraction, Elevated CPK, Heart murmur, History of echocardiogram, Hypertension, IBS (irritable bowel syndrome), Myocardial infarction (HCC) (06/2014), NSTEMI (non-ST elevated myocardial infarction) (HCC) (08/02/2015), Sinus drainage,  Type II diabetes mellitus (HCC), and Unstable angina (HCC) (01/05/2019). Surgical: Mr. Stecher  has a past surgical history that includes Cardiovascular stress test (05/06/2014); Appendectomy; TEE without cardioversion (N/A, 06/22/2014); MAZE (N/A, 06/22/2014); Laparoscopic cholecystectomy; Cataract extraction w/ intraocular lens implant (Left); Coronary artery bypass graft (N/A, 06/22/2014); Cardiac catheterization (N/A, 05/12/2014); Cardiac catheterization (N/A, 05/13/2014); Cardiac catheterization (Right, 05/13/2014); Cardiac catheterization (N/A, 05/13/2014); Cardiac catheterization (N/A, 06/18/2014); Cardiac catheterization (N/A, 08/03/2015); LEFT HEART CATH AND CORS/GRAFTS ANGIOGRAPHY (N/A, 01/06/2019); ICD IMPLANT (N/A, 01/07/2019); Colonoscopy with propofol  (N/A, 02/13/2020); polypectomy (02/13/2020); Cardioversion (N/A, 02/21/2022); LEFT HEART CATH AND CORS/GRAFTS ANGIOGRAPHY (N/A, 07/03/2023); and CORONARY STENT INTERVENTION (N/A, 07/03/2023). Family: family history includes Alzheimer's disease in his mother; Arrhythmia in his sister; Cancer in his brother; Emphysema in his father; Heart attack in his sister; Heart disease in his sister; Hyperlipidemia in his sister; Hypertension in his sister.  Laboratory Chemistry Profile   Renal Lab Results  Component Value Date   BUN 19 07/10/2023   CREATININE 1.01 07/10/2023   BCR 19 07/10/2023   GFR 53.87 (L) 11/30/2021   GFRAA >60 01/07/2019   GFRNONAA >60 06/24/2023    Hepatic Lab Results  Component Value Date   AST 37 06/24/2023   ALT 70 (H) 06/24/2023   ALBUMIN  3.7 06/24/2023   ALKPHOS 73 06/24/2023   HCVAB NEGATIVE 03/10/2015   LIPASE 30.0 11/30/2021    Electrolytes Lab Results  Component Value Date   NA 144 07/10/2023   K 4.1 07/10/2023   CL 104 07/10/2023   CALCIUM  9.0 07/10/2023   MG 2.3 06/23/2014    Bone No results found for: VD25OH, VD125OH2TOT, CI6874NY7, CI7874NY7, 25OHVITD1, 25OHVITD2, 25OHVITD3,  TESTOFREE, TESTOSTERONE  Inflammation (CRP: Acute Phase) (ESR: Chronic Phase) Lab Results  Component Value Date   ESRSEDRATE 4 07/28/2011         Note: Above Lab results reviewed.  Recent Imaging Review  CUP PACEART REMOTE DEVICE CHECK ICD Scheduled remote reviewed. Normal device function.  Presenting rhythm: VS, irregular R-R.  16 AF detections, total burden 0.3%, longest lasted 2 hr 6 min. Known history of AF, on Eliquis . Increase in AF in June and July, decreased this review period.   HF diagnostics have been abnormal in this monitoring period.  Next remote transmission per protocol. - CS, CVRS   MR LUMBAR SPINE WO CONTRAST   Narrative EXAM: MRI LUMBAR SPINE 09/04/2023 11:17:28 AM   TECHNIQUE: Multiplanar multisequence MRI of the lumbar spine was performed without the administration of intravenous contrast.   COMPARISON: Lumbar spine series dated 11/14/2021.   CLINICAL HISTORY: Lumbar radiculopathy, symptoms persist with > 6 wks treatment. Is a 78 year old male with bilateral chronic lower back pain with bilateral lower extremity sciatica. X-ray completed in 2023 with multiple level degenerative changes.   FINDINGS:   BONES AND ALIGNMENT:  Slight degenerative anterolisthesis at L3-4. Focal fatty foci within the vertebral bodies throughout the visualized thoracolumbar spine.   SPINAL CORD: The conus medullaris terminates at the mid body of L1.   SOFT TISSUES: No paraspinal mass.   L1-L2: No significant disc herniation. No spinal canal stenosis or neural foraminal narrowing.   L2-L3: Mild diffuse disc bulging causing mild central spinal canal stenosis and mild bilateral lateral recess stenosis.   L3-L4: Degenerative anterolisthesis and mild facet hypertrophy, causing mild-to-moderate central spinal canal stenosis and mild-to-moderate bilateral lateral recess stenosis. There is no apparent nerve root impingement.   L4-L5: Small bilateral lateral  recess stenosis.   L5-S1: Mild central disc bulge causing mild central spinal canal stenosis. The neural foramina are widely patent.   IMPRESSION: 1. Mild-to-moderate central spinal canal stenosis and mild-to-moderate bilateral lateral recess stenosis at L3-4 due to degenerative anterolisthesis and mild facet hypertrophy. No apparent nerve root impingement. 2. Mild central spinal canal stenosis and mild bilateral lateral recess stenosis at L2-3 due to mild diffuse disc bulging.   Electronically signed by: Evalene Coho MD 09/04/2023 12:50 PM EDT RP Workstation: [id number]   DG Lumbar Spine Complete   Narrative CLINICAL DATA:  Chronic back pain.   EXAM: LUMBAR SPINE - COMPLETE 4+ VIEW   COMPARISON:  No recent prior.   FINDINGS: Surgical clips right upper quadrant. Lumbar spine numbered the lowest segmented appearing lumbar shaped vertebrae on lateral view as L5. Diffuse multilevel disc degeneration and endplate osteophyte formation. Lucency noted along the posterior aspect of spinous process of L2, most likely related to overlying soft tissues. A fracture, possibly old, of the spinous process of L2 cannot be completely excluded. No acute bony abnormality otherwise identified. Aortoiliac atherosclerotic vascular calcification.   IMPRESSION: 1. Diffuse multilevel degenerative change. A lucency is noted over the posterior aspect of the L2 spinous process, most likely related to overlying soft tissues. A fracture, possibly old, of the spinous process of L2 cannot be completely excluded. No acute bony abnormality otherwise identified. 2. Aortoiliac atherosclerotic vascular disease.     Electronically Signed By: Debby  Register M.D. On: 11/15/2021 10:55  Note: Reviewed        Physical Exam  Vitals: BP (!) 145/72   Pulse 67   Temp 97.9 F (36.6 C)   Resp 16   Ht 5' 8 (1.727 m)   Wt 210 lb (95.3 kg)   SpO2 100%   BMI 31.93 kg/m  BMI: Estimated body mass index  is 31.93 kg/m as calculated from the following:   Height as of this encounter: 5' 8 (1.727 m).   Weight as of this encounter: 210 lb (95.3 kg). Ideal: Ideal body weight: 68.4 kg (150 lb 12.7 oz) Adjusted ideal body weight: 79.1 kg (174 lb 7.6 oz) General appearance: Well nourished, well developed, and well hydrated. In no apparent acute distress Mental status: Alert, oriented x 3 (person, place, & time)       Respiratory: No evidence of acute respiratory distress Eyes: PERLA  Lumbar Spine Area Exam  Skin & Axial Inspection: No masses, redness, or swelling Alignment: Symmetrical Functional ROM: Pain restricted ROM       Stability: No instability detected Muscle Tone/Strength: Functionally intact. No obvious neuro-muscular anomalies detected. Sensory (Neurological): Dermatomal pain pattern Palpation: No palpable anomalies       Provocative Tests: Hyperextension/rotation test: deferred today       Lumbar quadrant test (Kemp's test): deferred today       Lateral bending test: deferred  today       Patrick's Maneuver: deferred today                   FABER* test: deferred today                   S-I anterior distraction/compression test: deferred today         S-I lateral compression test: deferred today         S-I Thigh-thrust test: deferred today         S-I Gaenslen's test: deferred today         *(Flexion, ABduction and External Rotation) Gait & Posture Assessment  Ambulation: Unassisted Gait: Relatively normal for age and body habitus Posture: WNL  Lower Extremity Exam      Side: Right lower extremity   Side: Left lower extremity  Stability: No instability observed           Stability: No instability observed          Skin & Extremity Inspection: Skin color, temperature, and hair growth are WNL. No peripheral edema or cyanosis. No masses, redness, swelling, asymmetry, or associated skin lesions. No contractures.   Skin & Extremity Inspection: Skin color, temperature, and hair  growth are WNL. No peripheral edema or cyanosis. No masses, redness, swelling, asymmetry, or associated skin lesions. No contractures.  Functional ROM: Pain restricted ROM                   Functional ROM: Pain restricted ROM                  Muscle Tone/Strength: Functionally intact. No obvious neuro-muscular anomalies detected.   Muscle Tone/Strength: Functionally intact. No obvious neuro-muscular anomalies detected.  Sensory (Neurological): Neurogenic pain pattern         Sensory (Neurological): Neurogenic pain pattern        DTR: Patellar: deferred today Achilles: deferred today Plantar: deferred today   DTR: Patellar: deferred today Achilles: deferred today Plantar: deferred today  Palpation: No palpable anomalies   Palpation: No palpable anomalies    Assessment   Diagnosis  1. Chronic radicular lumbar pain   2. Spinal stenosis, lumbar region, with neurogenic claudication   3. Chronic pain syndrome      Assessment and Plan    Chronic lumbar radicular pain   He is currently managed with gabapentin , experiencing mild improvement without significant side effects. Cold symptoms may contribute to fatigue. Increase gabapentin  to 200 mg three times a day as needed based on pain levels. Send a new gabapentin  prescription to [facility] [address].  Planned lumbar epidural steroid injection at L3-L4   The injection is scheduled for November 10th to address lumbar radicular pain. Perform the injection at L3-L4 on November 10th. Administer Valium  for relaxation during the procedure. Ensure he has a driver and instruct him to fast from the night before the procedure.  Periprocedural anticoagulation and antiplatelet management for planned lumbar epidural steroid injection   He is currently on Plavix  and Eliquis . Temporarily discontinue these medications to reduce bleeding risk during the procedure. Discontinue Plavix  five days prior and Eliquis  three days prior to the procedure. Initiate baby  aspirin  after stopping Plavix  and continue until the day of the injection. Resume Plavix  and Eliquis  the day after the procedure.        Mr. GRAIG HESSLING has a current medication list which includes the following long-term medication(s): albuterol , eliquis , metoprolol  succinate, nitroglycerin , rosuvastatin , and gabapentin .  Pharmacotherapy (Medications Ordered): Meds ordered this encounter  Medications   gabapentin  (NEURONTIN ) 100 MG capsule    Sig: Take 2 capsules (200 mg total) by mouth 3 (three) times daily.    Dispense:  180 capsule    Refill:  0   Orders:  Orders Placed This Encounter  Procedures   Lumbar Epidural Injection    Standing Status:   Future    Expiration Date:   01/18/2024    Scheduling Instructions:     Procedure: Interlaminar Lumbar Epidural Steroid injection (LESI)            Laterality: L3/4     Sedation: Valium  5 mg PO    Where will this procedure be performed?:   [facility]    Return in about 25 days (around 11/12/2023) for L3/4 ESI (Stop Plavix  5 days prior, Eliqius 3 days prior, ok to take ASA).    Recent Visits Date Type Provider Dept  09/20/23 Office Visit Marcelino Nurse, MD [facility]  Showing recent visits within past 90 days and meeting all other requirements Today's Visits Date Type Provider Dept  10/18/23 Office Visit Marcelino Nurse, MD [facility]  Showing today's visits and meeting all other requirements Future Appointments No visits were found meeting these conditions. Showing future appointments within next 90 days and meeting all other requirements  I discussed the assessment and treatment plan with the patient. The patient was provided an opportunity to ask questions and all were answered. The patient agreed with the plan and demonstrated an understanding of the instructions.  I personally spent a total of 30 minutes in the care of the patient today including preparing to see the patient, getting/reviewing  separately obtained history, performing a medically appropriate exam/evaluation, counseling and educating, placing orders, and documenting clinical information in the EHR.   Note by: Nurse Marcelino, MD (TTS and AI technology used. I apologize for any typographical errors that were not detected and corrected.) Date: 10/18/2023; Time: 11:05 AM

## 2023-10-18 NOTE — Patient Instructions (Addendum)
 Stop Plavix  5 days prior, Eliqius 3 days prior, ok to take 81 mg aspirin .   Moderate Conscious Sedation, Adult Sedation is the use of medicines to help you relax and not feel pain. Moderate conscious sedation is a type of sedation that makes you less alert than normal. You are still able to respond to instructions, touch, or both. This type of sedation is used during short medical and dental procedures. It is milder than deep sedation, which is a type of sedation you cannot be easily woken up from. It is also milder than general anesthesia, which is the use of medicines to make you fall asleep. Moderate conscious sedation lets you return to your normal activities sooner. Tell a health care provider about: Any allergies you have. All medicines you are taking, including vitamins, herbs, steroids, eye drops, creams, and over-the-counter medicines. Any problems you or family members have had with anesthesia. Any bleeding problems you have. Any surgeries you have had. Any medical conditions you have. Whether you are pregnant or may be pregnant. Any recent alcohol, tobacco, or drug use. What are the risks? Your health care provider will talk with you about risks. These may include: Oversedation. This is when you get too much medicine. Nausea or vomiting. Allergic reaction to medicines. Trouble breathing. If this happens, a breathing tube may be used. It will be removed when you can breathe better on your own. Heart trouble. Lung trouble. Emergence delirium. This is when you feel confused while the sedation wears off. This gets better with time. What happens before the procedure? When to stop eating and drinking Follow instructions from your health care provider about what you may eat and drink. These may include: 8 hours before your procedure Stop eating most foods. Do not eat meat, fried foods, or fatty foods. Eat only light foods, such as toast or crackers. All liquids are okay except energy  drinks and alcohol. 6 hours before your procedure Stop eating. Drink only clear liquids, such as water , clear fruit juice, black coffee, plain tea, and sports drinks. Do not drink energy drinks or alcohol. 2 hours before your procedure Stop drinking all liquids. You may be allowed to take medicines with small sips of water . If you do not follow your health care provider's instructions, your procedure may be delayed or canceled. Medicines Ask your health care provider about: Changing or stopping your regular medicines. These include any diabetes medicines or blood thinners you take. Taking medicines such as aspirin  and ibuprofen. These medicines can thin your blood. Do not take them unless your health care provider tells you to. Taking over-the-counter medicines, vitamins, herbs, and supplements. Tests and exams You may have an exam or testing. You may have a blood or urine sample taken. General instructions Do not use any products that contain nicotine or tobacco for at least 4 weeks before the procedure. These products include cigarettes, chewing tobacco, and vaping devices, such as e-cigarettes. If you need help quitting, ask your health care provider. If you will be going home right after the procedure, plan to have a responsible adult: Take you home from the hospital or clinic. You will not be allowed to drive. Care for you for the time you are told. What happens during the procedure?  You will be given the sedative. It may be given: As a pill you can take by mouth. It can also be put into the rectum. As a spray through the nose. As an injection into muscle. As an injection  into a vein through an IV. You may be given oxygen as needed. Your blood pressure, heart rate, breathing rate, and blood oxygen level will be monitored during the procedure. The medical or dental procedure will be done. The procedure may vary among health care providers and hospitals. What happens after the  procedure? Your blood pressure, heart rate, breathing rate, and blood oxygen level will be monitored until you leave the hospital or clinic. You will get fluids through an IV as needed. Do not drive or operate machinery until your health care provider says that it is safe. This information is not intended to replace advice given to you by your health care provider. Make sure you discuss any questions you have with your health care provider. Document Revised: 07/04/2021 Document Reviewed: 07/04/2021 Elsevier Patient Education  2024 [employer]GENERAL RISKS AND COMPLICATIONS  What are the risk, side effects and possible complications? Generally speaking, most procedures are safe.  However, with any procedure there are risks, side effects, and the possibility of complications.  The risks and complications are dependent upon the sites that are lesioned, or the type of nerve block to be performed.  The closer the procedure is to the spine, the more serious the risks are.  Great care is taken when placing the radio frequency needles, block needles or lesioning probes, but sometimes complications can occur. Infection: Any time there is an injection through the skin, there is a risk of infection.  This is why sterile conditions are used for these blocks.  There are four possible types of infection. Localized skin infection. Central Nervous System Infection-This can be in the form of Meningitis, which can be deadly. Epidural Infections-This can be in the form of an epidural abscess, which can cause pressure inside of the spine, causing compression of the spinal cord with subsequent paralysis. This would require an emergency surgery to decompress, and there are no guarantees that the patient would recover from the paralysis. Discitis-This is an infection of the intervertebral discs.  It occurs in about 1% of discography procedures.  It is difficult to treat and it may lead to surgery.        2. Pain: the  needles have to go through skin and soft tissues, will cause soreness.       3. Damage to internal structures:  The nerves to be lesioned may be near blood vessels or    other nerves which can be potentially damaged.       4. Bleeding: Bleeding is more common if the patient is taking blood thinners such as  aspirin , Coumadin, Ticiid, Plavix , etc., or if he/she have some genetic predisposition  such as hemophilia. Bleeding into the spinal canal can cause compression of the spinal  cord with subsequent paralysis.  This would require an emergency surgery to  decompress and there are no guarantees that the patient would recover from the  paralysis.       5. Pneumothorax:  Puncturing of a lung is a possibility, every time a needle is introduced in  the area of the chest or upper back.  Pneumothorax refers to free air around the  collapsed lung(s), inside of the thoracic cavity (chest cavity).  Another two possible  complications related to a similar event would include: Hemothorax and Chylothorax.   These are variations of the Pneumothorax, where instead of air around the collapsed  lung(s), you may have blood or chyle, respectively.       6. Spinal headaches: They may  occur with any procedures in the area of the spine.       7. Persistent CSF (Cerebro-Spinal Fluid) leakage: This is a rare problem, but may occur  with prolonged intrathecal or epidural catheters either due to the formation of a fistulous  track or a dural tear.       8. Nerve damage: By working so close to the spinal cord, there is always a possibility of  nerve damage, which could be as serious as a permanent spinal cord injury with  paralysis.       9. Death:  Although rare, severe deadly allergic reactions known as Anaphylactic  reaction can occur to any of the medications used.      10. Worsening of the symptoms:  We can always make thing worse.  What are the chances of something like this happening? Chances of any of this occuring are  extremely low.  By statistics, you have more of a chance of getting killed in a motor vehicle accident: while driving to the hospital than any of the above occurring .  Nevertheless, you should be aware that they are possibilities.  In general, it is similar to taking a shower.  Everybody knows that you can slip, hit your head and get killed.  Does that mean that you should not shower again?  Nevertheless always keep in mind that statistics do not mean anything if you happen to be on the wrong side of them.  Even if a procedure has a 1 (one) in a 1,000,000 (million) chance of going wrong, it you happen to be that one..Also, keep in mind that by statistics, you have more of a chance of having something go wrong when taking medications.  Who should not have this procedure? If you are on a blood thinning medication (e.g. Coumadin, Plavix , see list of Blood Thinners), or if you have an active infection going on, you should not have the procedure.  If you are taking any blood thinners, please inform your physician.  How should I prepare for this procedure? Do not eat or drink anything at least six hours prior to the procedure. Bring a driver with you .  It cannot be a taxi. Come accompanied by an adult that can drive you back, and that is strong enough to help you if your legs get weak or numb from the local anesthetic. Take all of your medicines the morning of the procedure with just enough water  to swallow them. If you have diabetes, make sure that you are scheduled to have your procedure done first thing in the morning, whenever possible. If you have diabetes, take only half of your insulin  dose and notify our nurse that you have done so as soon as you arrive at the clinic. If you are diabetic, but only take blood sugar pills (oral hypoglycemic), then do not take them on the morning of your procedure.  You may take them after you have had the procedure. Do not take aspirin  or any aspirin -containing  medications, at least eleven (11) days prior to the procedure.  They may prolong bleeding. Wear loose fitting clothing that may be easy to take off and that you would not mind if it got stained with Betadine or blood. Do not wear any jewelry or perfume Remove any nail coloring.  It will interfere with some of our monitoring equipment.  NOTE: Remember that this is not meant to be interpreted as a complete list of all possible complications.  Unforeseen problems  may occur.  BLOOD THINNERS The following drugs contain aspirin  or other products, which can cause increased bleeding during surgery and should not be taken for 2 weeks prior to and 1 week after surgery.  If you should need take something for relief of minor pain, you may take acetaminophen  which is found in Tylenol ,m Datril, Anacin-3 and Panadol. It is not blood thinner. The products listed below are.  Do not take any of the products listed below in addition to any listed on your instruction sheet.  A.P.C or A.P.C with Codeine Codeine Phosphate Capsules #3 Ibuprofen Ridaura  ABC compound Congesprin Imuran rimadil  Advil Cope Indocin Robaxisal  Alka-Seltzer Effervescent Pain Reliever and Antacid Coricidin or Coricidin-D  Indomethacin Rufen  Alka-Seltzer plus Cold Medicine Cosprin Ketoprofen S-A-C Tablets  Anacin Analgesic Tablets or Capsules Coumadin Korlgesic Salflex  Anacin Extra Strength Analgesic tablets or capsules CP-2 Tablets Lanoril Salicylate  Anaprox Cuprimine Capsules Levenox Salocol  Anexsia-D Dalteparin Magan Salsalate  Anodynos Darvon compound Magnesium  Salicylate Sine-off  Ansaid Dasin Capsules Magsal Sodium Salicylate  Anturane Depen Capsules Marnal Soma  APF Arthritis pain formula Dewitt's Pills Measurin  Stanback  Argesic Dia-Gesic Meclofenamic Sulfinpyrazone  Arthritis Bayer Timed Release Aspirin  Diclofenac Meclomen Sulindac  Arthritis pain formula Anacin Dicumarol Medipren Supac  Analgesic (Safety coated) Arthralgen  Diffunasal Mefanamic Suprofen  Arthritis Strength Bufferin Dihydrocodeine Mepro Compound Suprol  Arthropan liquid Dopirydamole Methcarbomol with Aspirin  Synalgos  ASA tablets/Enseals Disalcid Micrainin Tagament  Ascriptin Doan's Midol Talwin  Ascriptin A/D Dolene Mobidin Tanderil  Ascriptin Extra Strength Dolobid Moblgesic Ticlid  Ascriptin with Codeine Doloprin or Doloprin with Codeine Momentum Tolectin  Asperbuf Duoprin Mono-gesic Trendar  Aspergum Duradyne Motrin or Motrin IB Triminicin  Aspirin  plain, buffered or enteric coated Durasal Myochrisine Trigesic  Aspirin  Suppositories Easprin Nalfon Trillsate  Aspirin  with Codeine Ecotrin Regular or Extra Strength Naprosyn Uracel  Atromid-S Efficin Naproxen Ursinus  Auranofin Capsules Elmiron Neocylate Vanquish  Axotal Emagrin Norgesic Verin  Azathioprine Empirin or Empirin with Codeine Normiflo Vitamin E  Azolid Emprazil Nuprin Voltaren  Bayer Aspirin  plain, buffered or children's or timed BC Tablets or powders Encaprin Orgaran Warfarin Sodium  Buff-a-Comp Enoxaparin  Orudis Zorpin  Buff-a-Comp with Codeine Equegesic Os-Cal-Gesic   Buffaprin Excedrin plain, buffered or Extra Strength Oxalid   Bufferin Arthritis Strength Feldene Oxphenbutazone   Bufferin plain or Extra Strength Feldene Capsules Oxycodone  with Aspirin    Bufferin with Codeine Fenoprofen Fenoprofen Pabalate or Pabalate-SF   Buffets II Flogesic Panagesic   Buffinol plain or Extra Strength Florinal or Florinal with Codeine Panwarfarin   Buf-Tabs Flurbiprofen Penicillamine   Butalbital Compound Four-way cold tablets Penicillin   Butazolidin Fragmin Pepto-Bismol   Carbenicillin Geminisyn Percodan   Carna Arthritis Reliever Geopen Persantine   Carprofen Gold's salt Persistin   Chloramphenicol Goody's Phenylbutazone   Chloromycetin Haltrain Piroxlcam   Clmetidine heparin  Plaquenil   Cllnoril Hyco-pap Ponstel   Clofibrate Hydroxy chloroquine Propoxyphen         Before  stopping any of these medications, be sure to consult the physician who ordered them.  Some, such as Coumadin (Warfarin) are ordered to prevent or treat serious conditions such as deep thrombosis, pumonary embolisms, and other heart problems.  The amount of time that you may need off of the medication may also vary with the medication and the reason for which you were taking it.  If you are taking any of these medications, please make sure you notify your pain physician before you undergo any procedures.  Epidural Steroid Injection Patient Information  Description: The epidural space surrounds the nerves as they exit the spinal cord.  In some patients, the nerves can be compressed and inflamed by a bulging disc or a tight spinal canal (spinal stenosis).  By injecting steroids into the epidural space, we can bring irritated nerves into direct contact with a potentially helpful medication.  These steroids act directly on the irritated nerves and can reduce swelling and inflammation which often leads to decreased pain.  Epidural steroids may be injected anywhere along the spine and from the neck to the low back depending upon the location of your pain.   After numbing the skin with local anesthetic (like Novocaine), a small needle is passed into the epidural space slowly.  You may experience a sensation of pressure while this is being done.  The entire block usually last less than 10 minutes.  Conditions which may be treated by epidural steroids:  Low back and leg pain Neck and arm pain Spinal stenosis Post-laminectomy syndrome Herpes zoster (shingles) pain Pain from compression fractures  Preparation for the injection:  Do not eat any solid food or dairy products within 8 hours of your appointment.  You may drink clear liquids up to 3 hours before appointment.  Clear liquids include water , black coffee, juice or soda.  No milk or cream please. You may take your regular  medication, including pain medications, with a sip of water  before your appointment  Diabetics should hold regular insulin  (if taken separately) and take 1/2 normal NPH dos the morning of the procedure.  Carry some sugar containing items with you to your appointment. A driver must accompany you and be prepared to drive you home after your procedure.  Bring all your current medications with your. An IV may be inserted and sedation may be given at the discretion of the physician.   A blood pressure cuff, EKG and other monitors will often be applied during the procedure.  Some patients may need to have extra oxygen administered for a short period. You will be asked to provide medical information, including your allergies, prior to the procedure.  We must know immediately if you are taking blood thinners (like Coumadin/Warfarin)  Or if you are allergic to IV iodine contrast (dye). We must know if you could possible be pregnant.  Possible side-effects: Bleeding from needle site Infection (rare, may require surgery) Nerve injury (rare) Numbness & tingling (temporary) Difficulty urinating (rare, temporary) Spinal headache ( a headache worse with upright posture) Light -headedness (temporary) Pain at injection site (several days) Decreased blood pressure (temporary) Weakness in arm/leg (temporary) Pressure sensation in back/neck (temporary)  Call if you experience: Fever/chills associated with headache or increased back/neck pain. Headache worsened by an upright position. New onset weakness or numbness of an extremity below the injection site Hives or difficulty breathing (go to the emergency room) Inflammation or drainage at the infection site Severe back/neck pain Any new symptoms which are concerning to you  Please note:  Although the local anesthetic injected can often make your back or neck feel good for several hours after the injection, the pain will likely return.  It takes 3-7 days  for steroids to work in the epidural space.  You may not notice any pain relief for at least that one week.  If effective, we will often do a series of three injections spaced 3-6 weeks apart to maximally decrease your pain.  After the initial series, we generally will wait several months  before considering a repeat injection of the same type.  If you have any questions, please call [phone] [facility]

## 2023-10-18 NOTE — Progress Notes (Signed)
 Safety precautions to be maintained throughout the outpatient stay will include: orient to surroundings, keep bed in low position, maintain call bell within reach at all times, provide assistance with transfer out of bed and ambulation.   Pt given application for disability placard signed by Dr. Marcelino.

## 2023-10-31 ENCOUNTER — Other Ambulatory Visit: Payer: Self-pay | Admitting: Student in an Organized Health Care Education/Training Program

## 2023-10-31 ENCOUNTER — Other Ambulatory Visit (HOSPITAL_COMMUNITY): Payer: Self-pay

## 2023-10-31 DIAGNOSIS — G8929 Other chronic pain: Secondary | ICD-10-CM

## 2023-10-31 DIAGNOSIS — M48062 Spinal stenosis, lumbar region with neurogenic claudication: Secondary | ICD-10-CM

## 2023-10-31 DIAGNOSIS — G894 Chronic pain syndrome: Secondary | ICD-10-CM

## 2023-11-01 ENCOUNTER — Other Ambulatory Visit (HOSPITAL_COMMUNITY): Payer: Self-pay

## 2023-11-05 ENCOUNTER — Telehealth: Payer: Self-pay

## 2023-11-05 ENCOUNTER — Other Ambulatory Visit (HOSPITAL_COMMUNITY): Payer: Self-pay

## 2023-11-05 DIAGNOSIS — E1159 Type 2 diabetes mellitus with other circulatory complications: Secondary | ICD-10-CM

## 2023-11-05 DIAGNOSIS — E1122 Type 2 diabetes mellitus with diabetic chronic kidney disease: Secondary | ICD-10-CM

## 2023-11-05 NOTE — Telephone Encounter (Signed)
 Pharmacy Patient Advocate Encounter  Received notification from [employer] that Prior Authorization for [facility] 3 plus sensors  has been APPROVED from 11/05/23 to 01/01/25. Ran test claim, Copay is $0.00 for 90 day supply. This test claim was processed through [facility]- copay amounts may vary at other pharmacies due to pharmacy/plan contracts, or as the patient moves through the different stages of their insurance plan.   PA #/Case ID/Reference #: # [id number]

## 2023-11-05 NOTE — Telephone Encounter (Signed)
 Pharmacy Patient Advocate Encounter   Received notification from [employer]base that prior authorization for [facility] 3 plus sensors is required/requested.   Insurance verification completed.   The patient is insured through [facility]   Per test claim: PA required; PA submitted to above mentioned insurance via Latent Key/confirmation #/EOC [id number] Status is pending

## 2023-11-06 MED ORDER — FREESTYLE LIBRE 3 PLUS SENSOR MISC: 1 refills | Status: DC

## 2023-11-06 NOTE — Telephone Encounter (Signed)
 Copied from CRM [id number]. Topic: Clinical - Prescription Issue >> Nov 06, 2023  9:58 AM Vena HERO wrote: Reason for CRM: Pt called in after calling pharmacy to state that they have not received auth for his Continuous Glucose Sensor (FREESTYLE LIBRE 3 PLUS SENSOR) MISC. CRM state that PA has been approved so pt would like [facility] to [facility] please.

## 2023-11-06 NOTE — Telephone Encounter (Signed)
 Noted, Rx sent to pharmacy.

## 2023-11-12 ENCOUNTER — Encounter: Payer: Self-pay | Admitting: Student in an Organized Health Care Education/Training Program

## 2023-11-12 ENCOUNTER — Ambulatory Visit
Admission: RE | Admit: 2023-11-12 | Discharge: 2023-11-12 | Disposition: A | Discharge status: Home / Self Care | Source: Ambulatory Visit | Attending: Student in an Organized Health Care Education/Training Program | Admitting: Student in an Organized Health Care Education/Training Program

## 2023-11-12 ENCOUNTER — Ambulatory Visit: Admitting: Student in an Organized Health Care Education/Training Program

## 2023-11-12 VITALS — BP 122/71 | HR 75 | Temp 97.7°F | Resp 16 | Ht 68.0 in | Wt 212.0 lb

## 2023-11-12 DIAGNOSIS — G8929 Other chronic pain: Secondary | ICD-10-CM | POA: Diagnosis present

## 2023-11-12 DIAGNOSIS — G894 Chronic pain syndrome: Secondary | ICD-10-CM | POA: Diagnosis present

## 2023-11-12 DIAGNOSIS — M48062 Spinal stenosis, lumbar region with neurogenic claudication: Secondary | ICD-10-CM | POA: Insufficient documentation

## 2023-11-12 DIAGNOSIS — M5416 Radiculopathy, lumbar region: Secondary | ICD-10-CM

## 2023-11-12 MED ORDER — IOHEXOL 180 MG/ML  SOLN
10.0000 mL | Freq: Once | INTRAMUSCULAR | Status: AC
  Administered 2023-11-12: 10 mL via EPIDURAL
  Filled 2023-11-12: qty 20

## 2023-11-12 MED ORDER — DEXAMETHASONE SOD PHOSPHATE PF 10 MG/ML IJ SOLN
10.0000 mg | Freq: Once | INTRAMUSCULAR | Status: AC
  Administered 2023-11-12: 10 mg

## 2023-11-12 MED ORDER — ROPIVACAINE HCL 2 MG/ML IJ SOLN
2.0000 mL | Freq: Once | INTRAMUSCULAR | Status: AC
  Administered 2023-11-12: 2 mL via EPIDURAL
  Filled 2023-11-12: qty 20

## 2023-11-12 MED ORDER — LIDOCAINE HCL 2 % IJ SOLN
20.0000 mL | Freq: Once | INTRAMUSCULAR | Status: AC
  Administered 2023-11-12: 400 mg
  Filled 2023-11-12: qty 20

## 2023-11-12 MED ORDER — SODIUM CHLORIDE 0.9% FLUSH
2.0000 mL | Freq: Once | INTRAVENOUS | Status: AC
  Administered 2023-11-12: 2 mL

## 2023-11-12 NOTE — Progress Notes (Signed)
 PROVIDER NOTE: Interpretation of information contained herein should be left to medically-trained personnel. Specific patient instructions are provided elsewhere under Patient Instructions section of medical record. This document was created in part using STT-dictation technology, any transcriptional errors that may result from this process are unintentional.  Patient: [name] Type: Established DOB: Jun 28, 1945 MRN: [id number] PCP: Gretta Comer POUR, NP  Service: Procedure DOS: 11/12/2023 Setting: Ambulatory Location: Ambulatory outpatient facility Delivery: Face-to-face Provider: Wallie Sherry, MD Specialty: Interventional Pain Management Specialty designation: 09 Location: Outpatient facility Ref. Prov.: Gretta Comer POUR, NP       Interventional Therapy   Type: Lumbar epidural steroid injection (LESI) (interlaminar) #1    Laterality: Midline   Level:  L3-4 Level.  Imaging: Fluoroscopic guidance         Anesthesia: Local anesthesia (1-2% Lidocaine ) Sedation: No Sedation                       DOS: 11/12/2023  Performed by: Wallie Sherry, MD  Purpose: Diagnostic/Therapeutic Indications: Lumbar radicular pain of intraspinal etiology of more than 4 weeks that has failed to respond to conservative therapy and is severe enough to impact quality of life or function. 1. Chronic radicular lumbar pain   2. Spinal stenosis, lumbar region, with neurogenic claudication   3. Chronic pain syndrome    NAS-11 Pain score:   Pre-procedure: 6 /10   Post-procedure: 6 /10   Patient stopped Plavix  5 days prior, Eliquis  3 days prior.    Position / Prep / Materials:  Position: Prone w/ head of the table raised (slight reverse trendelenburg) to facilitate breathing.  Prep solution: ChloraPrep (2% chlorhexidine  gluconate and 70% isopropyl alcohol) Prep Area: Entire Posterior Lumbar Region from lower scapular tip down to mid buttocks area and from flank to flank. Materials:  Tray: Epidural  tray Needle(s):  Type: Epidural needle (Tuohy) Gauge (G):  22 Length: Regular (3.5-in) Qty: 1  H&P (Pre-op Assessment):  [name] is a 78 y.o. (year old), male patient, seen today for interventional treatment. He  has a past surgical history that includes Cardiovascular stress test (05/06/2014); Appendectomy; TEE without cardioversion (N/A, 06/22/2014); MAZE (N/A, 06/22/2014); Laparoscopic cholecystectomy; Cataract extraction w/ intraocular lens implant (Left); Coronary artery bypass graft (N/A, 06/22/2014); Cardiac catheterization (N/A, 05/12/2014); Cardiac catheterization (N/A, 05/13/2014); Cardiac catheterization (Right, 05/13/2014); Cardiac catheterization (N/A, 05/13/2014); Cardiac catheterization (N/A, 06/18/2014); Cardiac catheterization (N/A, 08/03/2015); LEFT HEART CATH AND CORS/GRAFTS ANGIOGRAPHY (N/A, 01/06/2019); ICD IMPLANT (N/A, 01/07/2019); Colonoscopy with propofol  (N/A, 02/13/2020); polypectomy (02/13/2020); Cardioversion (N/A, 02/21/2022); LEFT HEART CATH AND CORS/GRAFTS ANGIOGRAPHY (N/A, 07/03/2023); and CORONARY STENT INTERVENTION (N/A, 07/03/2023). Mr. Allum has a current medication list which includes the following prescription(s): albuterol , clopidogrel , freestyle libre 3 plus sensor, dapagliflozin  propanediol, eliquis , repatha  sureclick, gabapentin , ibuprofen, novolog  flexpen, tresiba  flextouch, insulin  pen needle, metoprolol  succinate, nitroglycerin , ondansetron , OVER THE COUNTER MEDICATION, rosuvastatin , entresto , semaglutide  (1 mg/dose), spacer/aero-hold chamber bags, and tamsulosin . His primarily concern today is the Back Pain (lower)  Initial Vital Signs:  Pulse/HCG Rate: 75ECG Heart Rate: 83 Temp: 97.7 F (36.5 C) Resp: 18 BP: 134/63 SpO2: 95 %  BMI: Estimated body mass index is 32.23 kg/m as calculated from the following:   Height as of this encounter: 5' 8 (1.727 m).   Weight as of this encounter: 212 lb (96.2 kg).  Risk Assessment: Allergies: Reviewed. He is  allergic to lipitor [atorvastatin ], pravastatin , and simvastatin .  Allergy Precautions: None required Coagulopathies: Reviewed. None identified.  Blood-thinner therapy: None at this time Active  Infection(s): Reviewed. None identified. [name] is afebrile  Site Confirmation: [name] was asked to confirm the procedure and laterality before marking the site Procedure checklist: Completed Consent: Before the procedure and under the influence of no sedative(s), amnesic(s), or anxiolytics, the patient was informed of the treatment options, risks and possible complications. To fulfill our ethical and legal obligations, as recommended by the American Medical Association's Code of Ethics, I have informed the patient of my clinical impression; the nature and purpose of the treatment or procedure; the risks, benefits, and possible complications of the intervention; the alternatives, including doing nothing; the risk(s) and benefit(s) of the alternative treatment(s) or procedure(s); and the risk(s) and benefit(s) of doing nothing. The patient was provided information about the general risks and possible complications associated with the procedure. These may include, but are not limited to: failure to achieve desired goals, infection, bleeding, organ or nerve damage, allergic reactions, paralysis, and death. In addition, the patient was informed of those risks and complications associated to Spine-related procedures, such as failure to decrease pain; infection (i.e.: Meningitis, epidural or intraspinal abscess); bleeding (i.e.: epidural hematoma, subarachnoid hemorrhage, or any other type of intraspinal or peri-dural bleeding); organ or nerve damage (i.e.: Any type of peripheral nerve, nerve root, or spinal cord injury) with subsequent damage to sensory, motor, and/or autonomic systems, resulting in permanent pain, numbness, and/or weakness of one or several areas of the body; allergic reactions; (i.e.:  anaphylactic reaction); and/or death. Furthermore, the patient was informed of those risks and complications associated with the medications. These include, but are not limited to: allergic reactions (i.e.: anaphylactic or anaphylactoid reaction(s)); adrenal axis suppression; blood sugar elevation that in diabetics may result in ketoacidosis or comma; water  retention that in patients with history of congestive heart failure may result in shortness of breath, pulmonary edema, and decompensation with resultant heart failure; weight gain; swelling or edema; medication-induced neural toxicity; particulate matter embolism and blood vessel occlusion with resultant organ, and/or nervous system infarction; and/or aseptic necrosis of one or more joints. Finally, the patient was informed that Medicine is not an exact science; therefore, there is also the possibility of unforeseen or unpredictable risks and/or possible complications that may result in a catastrophic outcome. The patient indicated having understood very clearly. We have given the patient no guarantees and we have made no promises. Enough time was given to the patient to ask questions, all of which were answered to the patient's satisfaction. Mr. Mastro has indicated that he wanted to continue with the procedure. Attestation: I, the ordering provider, attest that I have discussed with the patient the benefits, risks, side-effects, alternatives, likelihood of achieving goals, and potential problems during recovery for the procedure that I have provided informed consent. Date  Time: 11/12/2023  8:55 AM  Pre-Procedure Preparation:  Monitoring: As per clinic protocol. Respiration, ETCO2, SpO2, BP, heart rate and rhythm monitor placed and checked for adequate function Safety Precautions: Patient was assessed for positional comfort and pressure points before starting the procedure. Time-out: I initiated and conducted the Time-out before starting the  procedure, as per protocol. The patient was asked to participate by confirming the accuracy of the Time Out information. Verification of the correct person, site, and procedure were performed and confirmed by me, the nursing staff, and the patient. Time-out conducted as per Joint Commission's Universal Protocol (UP.01.01.01). Time: 0951 Start Time: 0951 hrs.  Description/Narrative of Procedure:          Target: Epidural space via interlaminar opening,  initially targeting the lower laminar border of the superior vertebral body. Region: Lumbar Approach: Percutaneous paravertebral  Rationale (medical necessity): procedure needed and proper for the diagnosis and/or treatment of the patient's medical symptoms and needs. Procedural Technique Safety Precautions: Aspiration looking for blood return was conducted prior to all injections. At no point did we inject any substances, as a needle was being advanced. No attempts were made at seeking any paresthesias. Safe injection practices and needle disposal techniques used. Medications properly checked for expiration dates. SDV (single dose vial) medications used. Description of the Procedure: Protocol guidelines were followed. The procedure needle was introduced through the skin, ipsilateral to the reported pain, and advanced to the target area. Bone was contacted and the needle walked caudad, until the lamina was cleared. The epidural space was identified using "loss-of-resistance technique" with 2-3 ml of PF-NaCl (0.9% NSS), in a 5cc LOR glass syringe.  Vitals:   11/12/23 0902 11/12/23 0950  BP: 134/63 122/71  Pulse: 75   Resp: 18 16  Temp: 97.7 F (36.5 C)   TempSrc: Temporal   SpO2: 95% 97%  Weight: 212 lb (96.2 kg)   Height: 5' 8 (1.727 m)     Start Time: 0951 hrs. End Time: 0955 hrs.  Imaging Guidance (Spinal):          Type of Imaging Technique: Fluoroscopy Guidance (Spinal) Indication(s): Fluoroscopy guidance for needle placement to  enhance accuracy in procedures requiring precise needle localization for targeted delivery of medication in or near specific anatomical locations not easily accessible without such real-time imaging assistance. Exposure Time: Please see nurses notes. Contrast: Before injecting any contrast, we confirmed that the patient did not have an allergy to iodine, shellfish, or radiological contrast. Once satisfactory needle placement was completed at the desired level, radiological contrast was injected. Contrast injected under live fluoroscopy. No contrast complications. See chart for type and volume of contrast used. Fluoroscopic Guidance: I was personally present during the use of fluoroscopy. Tunnel Vision Technique used to obtain the best possible view of the target area. Parallax error corrected before commencing the procedure. Direction-depth-direction technique used to introduce the needle under continuous pulsed fluoroscopy. Once target was reached, antero-posterior, oblique, and lateral fluoroscopic projection used confirm needle placement in all planes. Images permanently stored in EMR. Interpretation: I personally interpreted the imaging intraoperatively. Adequate needle placement confirmed in multiple planes. Appropriate spread of contrast into desired area was observed. No evidence of afferent or efferent intravascular uptake. No intrathecal or subarachnoid spread observed. Permanent images saved into the patient's record.  Antibiotic Prophylaxis:   Anti-infectives (From admission, onward)    None      Indication(s): None identified  Post-operative Assessment:  Post-procedure Vital Signs:  Pulse/HCG Rate: 7583 Temp: 97.7 F (36.5 C) Resp: 16 BP: 122/71 SpO2: 97 %  EBL: None  Complications: No immediate post-treatment complications observed by team, or reported by patient.  Note: The patient tolerated the entire procedure well. A repeat set of vitals were taken after the  procedure and the patient was kept under observation following institutional policy, for this type of procedure. Post-procedural neurological assessment was performed, showing return to baseline, prior to discharge. The patient was provided with post-procedure discharge instructions, including a section on how to identify potential problems. Should any problems arise concerning this procedure, the patient was given instructions to immediately contact us , at any time, without hesitation. In any case, we plan to contact the patient by telephone for a follow-up status report regarding this interventional  procedure.  Comments:  No additional relevant information.  Plan of Care (POC)  Orders:  Orders Placed This Encounter  Procedures   DG PAIN CLINIC C-ARM 1-60 MIN NO REPORT    Intraoperative interpretation by procedural physician at [facility].    Standing Status:   Standing    Number of Occurrences:   1    Reason for exam::   Assistance in needle guidance and placement for procedures requiring needle placement in or near specific anatomical locations not easily accessible without such assistance.   Okay to restart blood thinners tomorrow  Medications ordered for procedure: Meds ordered this encounter  Medications   iohexol  (OMNIPAQUE ) 180 MG/ML injection 10 mL    Must be Myelogram-compatible. If not available, you may substitute with a water -soluble, non-ionic, hypoallergenic, myelogram-compatible radiological contrast medium.   lidocaine  (XYLOCAINE ) 2 % (with pres) injection 400 mg   ropivacaine (PF) 2 mg/mL (0.2%) (NAROPIN) injection 2 mL   sodium chloride  flush (NS) 0.9 % injection 2 mL   dexamethasone (DECADRON) injection 10 mg   Medications administered: We administered iohexol , lidocaine , ropivacaine (PF) 2 mg/mL (0.2%), sodium chloride  flush, and dexamethasone.  See the medical record for exact dosing, route, and time of administration.    L3/4 ESI    Follow-up plan:    Return in about 4 weeks (around 12/10/2023) for PPE, F2F, Seema.     Recent Visits Date Type Provider Dept  10/18/23 Office Visit Marcelino Nurse, MD [facility]  09/20/23 Office Visit Marcelino Nurse, MD [facility]  Showing recent visits within past 90 days and meeting all other requirements Today's Visits Date Type Provider Dept  11/12/23 Procedure visit Marcelino Nurse, MD [facility]  Showing today's visits and meeting all other requirements Future Appointments Date Type Provider Dept  12/10/23 Appointment Patel, Seema K, NP [facility]  Showing future appointments within next 90 days and meeting all other requirements   Disposition: Discharge home  Discharge (Date  Time): 11/12/2023; 0959 hrs.   Primary Care Physician: Gretta Comer POUR, NP Location: [facility] Note by: Nurse Marcelino, MD (TTS technology used. I apologize for any typographical errors that were not detected and corrected.) Date: 11/12/2023; Time: 10:10 AM  Disclaimer:  Medicine is not an visual merchandiser. The only guarantee in medicine is that nothing is guaranteed. It is important to note that the decision to proceed with this intervention was based on the information collected from the patient. The Data and conclusions were drawn from the patient's questionnaire, the interview, and the physical examination. Because the information was provided in large part by the patient, it cannot be guaranteed that it has not been purposely or unconsciously manipulated. Every effort has been made to obtain as much relevant data as possible for this evaluation. It is important to note that the conclusions that lead to this procedure are derived in large part from the available data. Always take into account that the treatment will also be dependent on availability of resources and existing treatment guidelines, considered by other Pain Management Practitioners as being  common knowledge and practice, at the time of the intervention. For Medico-Legal purposes, it is also important to point out that variation in procedural techniques and pharmacological choices are the acceptable norm. The indications, contraindications, technique, and results of the above procedure should only be interpreted and judged by a Board-Certified Interventional Pain Specialist with extensive familiarity and expertise in the same exact procedure and technique.

## 2023-11-12 NOTE — Patient Instructions (Signed)

## 2023-11-13 ENCOUNTER — Telehealth: Payer: Self-pay

## 2023-11-13 NOTE — Telephone Encounter (Signed)
 Post procedure follow up.  LM

## 2023-11-16 ENCOUNTER — Encounter: Payer: Self-pay | Admitting: Primary Care

## 2023-11-16 ENCOUNTER — Ambulatory Visit: Payer: Self-pay | Admitting: Primary Care

## 2023-11-16 ENCOUNTER — Ambulatory Visit: Admitting: Primary Care

## 2023-11-16 VITALS — BP 132/68 | HR 67 | Temp 98.0°F | Ht 68.0 in | Wt 210.5 lb

## 2023-11-16 DIAGNOSIS — N182 Chronic kidney disease, stage 2 (mild): Secondary | ICD-10-CM

## 2023-11-16 DIAGNOSIS — E1122 Type 2 diabetes mellitus with diabetic chronic kidney disease: Secondary | ICD-10-CM

## 2023-11-16 DIAGNOSIS — Z794 Long term (current) use of insulin: Secondary | ICD-10-CM | POA: Diagnosis not present

## 2023-11-16 LAB — POCT GLYCOSYLATED HEMOGLOBIN (HGB A1C): Hemoglobin A1C: 9.2 % — AB (ref 4.0–5.6)

## 2023-11-16 MED ORDER — OZEMPIC (2 MG/DOSE) 8 MG/3ML ~~LOC~~ SOPN: 2.0000 mg | PEN_INJECTOR | SUBCUTANEOUS | 1 refills | Status: AC

## 2023-11-16 NOTE — Assessment & Plan Note (Signed)
 Deteriorated and remains uncontrolled with A1c of 9.2 today.  I do suspect this is largely secondary to his poor diet and regular consumption of candy/cookies. We discussed to eliminate candy bars and cookies, increase protein in all meals to prevent glucose drops.  Continue Tresiba  100 units daily, NovoLog  20 units 3 times daily with meals Increase Ozempic  to 2 mg weekly  Will consult with pharmacy team.   Follow-up in 3 months

## 2023-11-16 NOTE — Patient Instructions (Signed)
 We increased the dose of your Ozempic  to 2 mg weekly for diabetes.  Continue Tresiba  insulin  100 units daily.  Continue NovoLog  mealtime insulin  20 units 3 times daily.  Avoid eating candy bars and cookies.  Increase protein throughout the day to prevent glucose drops.  I will have my pharmacist contact you so please answer the phone.  Please schedule a follow up visit for 3 months.  It was a pleasure to see you today!

## 2023-11-16 NOTE — Progress Notes (Signed)
 Subjective:    Patient ID: [name], male    DOB: 02-18-1945, 78 y.o.   MRN: [id number]  [name] is a very pleasant 78 y.o. male with a history of CAD, type 2 diabetes, CHF, BPH, hypertension, hyperlipidemia who presents today for follow-up of diabetes  1) Type 2 Diabetes:  Current medications include: Ozempic  1 mg weekly, Tresiba  100 units daily, NovoLog  20 units 3 times daily.  He received a steroid injection on Monday this week, prednisone  1 week ago and in October 2025.  He is tolerating Ozempic  well overall.  He is checking his blood glucose continuously.  Time in range over the last 7 days: 38% Time in range over the last 14 days: 40% Time in range over the last 30 days: 29% Hypoglycemic episodes: 1%   He continues to eat candy or cookies daily in an attempt to prevent his glucose for dropping. The lowest reading has been 54.  Last A1C: 8.9 in August 2025, 9.2 today Last Eye Exam: Due Last Foot Exam: Up-to-date Pneumonia Vaccination: 2025 Urine Microalbumin: Up-to-date Statin: Rosuvastatin   Dietary changes since last visit: Sausage/egg/biscuits, sandwich soup, potatoes/rice/pasta, candy bars during the day, cookies.    Exercise: Golfing   BP Readings from Last 3 Encounters:  11/16/23 132/68  11/12/23 122/71  10/18/23 (!) 145/72      Review of Systems  Respiratory:  Negative for shortness of breath.   Cardiovascular:  Negative for chest pain.  Neurological:  Negative for dizziness and numbness.         Past Medical History:  Diagnosis Date   Arthritis    fingers, shoulders (08/03/2015)   Atrial fibrillation (HCC)    Paroxysmal, rare episodes, sinus rhythm on flecainide , patient prefers not to take Coumadin   Bradycardia    April, 2013   Chest pain    Nuclear, 2006, no ischemia   Chronic lower back pain    all my life   Coronary artery disease    s/p staged cath 05/12/2014 and 5/11, DES x 2 to heavily calcified RCA, residual with 60%  prox LAD, 70% D1   Ejection fraction    EF 55-60%,  echo, January, 2011   Elevated CPK    CPK elevated with normal MB and normal troponin the past   Heart murmur    History of echocardiogram    Echo 8/16: EF 55%, normal wall motion, grade 2 diastolic dysfunction, trivial MR, normal RV function, PASP 27 mmHg   Hypertension    IBS (irritable bowel syndrome)    Myocardial infarction (HCC) 06/2014   NSTEMI (non-ST elevated myocardial infarction) (HCC) 08/02/2015   Sinus drainage    Type II diabetes mellitus (HCC)    Unstable angina (HCC) 01/05/2019    Social History   Socioeconomic History   Marital status: Married    Spouse name: Not on file   Number of children: Not on file   Years of education: Not on file   Highest education level: Not on file  Occupational History   Not on file  Tobacco Use   Smoking status: Never   Smokeless tobacco: Never  Vaping Use   Vaping status: Never Used  Substance and Sexual Activity   Alcohol use: No   Drug use: No   Sexual activity: Not on file  Other Topics Concern   Not on file  Social History Narrative   Not on file   Social Drivers of Health   Financial Resource Strain: Low Risk  (  01/10/2023)   Overall Financial Resource Strain (CARDIA)    Difficulty of Paying Living Expenses: Not very hard  Food Insecurity: No Food Insecurity (01/10/2023)   Hunger Vital Sign    Worried About Running Out of Food in the Last Year: Never true    Ran Out of Food in the Last Year: Never true  Transportation Needs: No Transportation Needs (01/10/2023)   PRAPARE - Administrator, Civil Service (Medical): No    Lack of Transportation (Non-Medical): No  Physical Activity: Sufficiently Active (01/10/2023)   Exercise Vital Sign    Days of Exercise per Week: 3 days    Minutes of Exercise per Session: 150+ min  Stress: No Stress Concern Present (01/10/2023)   [employer] of Occupational Health - Occupational Stress Questionnaire    Feeling of  Stress : Not at all  Social Connections: Socially Integrated (01/10/2023)   Social Connection and Isolation Panel    Frequency of Communication with Friends and Family: More than three times a week    Frequency of Social Gatherings with Friends and Family: More than three times a week    Attends Religious Services: More than 4 times per year    Active Member of Clubs or Organizations: Yes    Attends Banker Meetings: More than 4 times per year    Marital Status: Married  Catering Manager Violence: Not At Risk (01/10/2023)   Humiliation, Afraid, Rape, and Kick questionnaire    Fear of Current or Ex-Partner: No    Emotionally Abused: No    Physically Abused: No    Sexually Abused: No    Past Surgical History:  Procedure Laterality Date   APPENDECTOMY     CARDIAC CATHETERIZATION N/A 05/12/2014   Procedure: Left Heart Cath and Coronary Angiography;  Surgeon: Debby DELENA Sor, MD;  Location: [facility];  Service: Cardiovascular;  Laterality: N/A;   CARDIAC CATHETERIZATION N/A 05/13/2014   Procedure: Coronary/Graft Atherectomy;  Surgeon: Debby DELENA Sor, MD;  Location: [facility];  Service: Cardiovascular;  Laterality: N/A;   CARDIAC CATHETERIZATION Right 05/13/2014   Procedure: Temporary Pacemaker;  Surgeon: Debby DELENA Sor, MD;  Location: [facility];  Service: Cardiovascular;  Laterality: Right;   CARDIAC CATHETERIZATION N/A 05/13/2014   Procedure: Coronary Stent Intervention;  Surgeon: Debby DELENA Sor, MD;  [facility]: [facility];  Service: Cardiovascular;  Laterality: N/A;   CARDIAC CATHETERIZATION N/A 06/18/2014   Procedure: Left Heart Cath and Coronary Angiography;  Surgeon: Peter M Jordan, MD;  Location: [facility] INVASIVE CV LAB;  Service: Cardiovascular;  Laterality: N/A;   CARDIAC CATHETERIZATION N/A 08/03/2015   Procedure: Left Heart Cath and Cors/Grafts Angiography;  Surgeon: Lonni Hanson, MD;  Location: [facility] INVASIVE CV LAB;  Service: Cardiovascular;   Laterality: N/A;   CARDIOVASCULAR STRESS TEST  05/06/2014   CARDIOVERSION N/A 02/21/2022   Procedure: CARDIOVERSION;  Surgeon: Hobart Powell BRAVO, MD;  Location: [facility] ENDOSCOPY;  Service: Cardiovascular;  Laterality: N/A;   CATARACT EXTRACTION W/ INTRAOCULAR LENS IMPLANT Left    COLONOSCOPY WITH PROPOFOL  N/A 02/13/2020   Procedure: COLONOSCOPY WITH PROPOFOL ;  Surgeon: Rollin Dover, MD;  Location: [facility] ENDOSCOPY;  Service: Endoscopy;  Laterality: N/A;   CORONARY ARTERY BYPASS GRAFT N/A 06/22/2014   Procedure: CORONARY ARTERY BYPASS GRAFTING (CABG) x five, using left internal mammary artery, and right leg greater saphenous vein harvested endoscopically;  Surgeon: Elspeth JAYSON Millers, MD;  Location: [facility] OR;  Service: Open Heart Surgery;  Laterality: N/A;  CORONARY STENT INTERVENTION N/A 07/03/2023   Procedure: CORONARY STENT INTERVENTION;  Surgeon: Elmira Newman PARAS, MD;  [facility];  Service: Cardiovascular;  Laterality: N/A;   ICD IMPLANT N/A 01/07/2019   Procedure: ICD IMPLANT;  Surgeon: Kelsie Agent, MD;  [facility]: [facility];  Service: Cardiovascular;  Laterality: N/A;   LAPAROSCOPIC CHOLECYSTECTOMY     LEFT HEART CATH AND CORS/GRAFTS ANGIOGRAPHY N/A 01/06/2019   Procedure: LEFT HEART CATH AND CORS/GRAFTS ANGIOGRAPHY;  Surgeon: Mady Bruckner, MD;  [facility];  Service: Cardiovascular;  Laterality: N/A;   LEFT HEART CATH AND CORS/GRAFTS ANGIOGRAPHY N/A 07/03/2023   Procedure: LEFT HEART CATH AND CORS/GRAFTS ANGIOGRAPHY;  Surgeon: Elmira Newman PARAS, MD;  [facility];  Service: Cardiovascular;  Laterality: N/A;   MAZE N/A 06/22/2014   Procedure: MAZE;  Surgeon: Elspeth JAYSON Millers, MD;  Location: [facility] OR;  Service: Open Heart Surgery;  Laterality: N/A;   POLYPECTOMY  02/13/2020   Procedure: POLYPECTOMY;  Surgeon: Rollin Dover, MD;  Location: [facility] ENDOSCOPY;  Service: Endoscopy;;   TEE WITHOUT CARDIOVERSION N/A 06/22/2014    Procedure: TRANSESOPHAGEAL ECHOCARDIOGRAM (TEE);  Surgeon: Elspeth JAYSON Millers, MD;  Location: [facility] OR;  Service: Open Heart Surgery;  Laterality: N/A;    Family History  Problem Relation Age of Onset   Alzheimer's disease Mother    Emphysema Father    Cancer Brother    Arrhythmia Sister    Heart attack Sister    Heart disease Sister    Hyperlipidemia Sister    Hypertension Sister     Allergies  Allergen Reactions   Lipitor [Atorvastatin ] Other (See Comments)    Myopathy Spring 2006   Pravastatin  Other (See Comments)    LEG CRAMPS   Simvastatin  Other (See Comments)    LEG CRAMPS    Current Outpatient Medications on File Prior to Visit  Medication Sig Dispense Refill   albuterol  (VENTOLIN  HFA) 108 (90 Base) MCG/ACT inhaler Inhale 2 puffs into the lungs every 6 (six) hours as needed for wheezing or shortness of breath. 8 g 3   clopidogrel  (PLAVIX ) 75 MG tablet Take 1 tablet (75 mg total) by mouth daily. 90 tablet 3   Continuous Glucose Sensor (FREESTYLE LIBRE 3 PLUS SENSOR) MISC Use to check blood sugar continuously. Change sensor every 15 days. 6 each 1   dapagliflozin  propanediol (FARXIGA ) 10 MG TABS tablet Take 1 tablet (10 mg total) by mouth daily before breakfast. For diabetes. 90 tablet 1   ELIQUIS  5 MG TABS tablet TAKE 1 TABLET BY MOUTH TWICE A DAY 60 tablet 5   Evolocumab  (REPATHA  SURECLICK) 140 MG/ML SOAJ Inject 140 mg into the skin every 14 (fourteen) days. 6 mL 3   gabapentin  (NEURONTIN ) 100 MG capsule Take 2 capsules (200 mg total) by mouth 3 (three) times daily. 180 capsule 0   ibuprofen (ADVIL) 200 MG tablet Take 200 mg by mouth every 8 (eight) hours as needed.     insulin  aspart (NOVOLOG  FLEXPEN) 100 UNIT/ML FlexPen Inject 20 Units into the skin 3 (three) times daily with meals.     insulin  degludec (TRESIBA  FLEXTOUCH) 200 UNIT/ML FlexTouch Pen Inject 100 Units into the skin daily. Via Novo Cares PAP 36 mL 0   Insulin  Pen Needle 31G X 8 MM MISC Use as directed with  Lantus . 100 each 5   metoprolol  succinate (TOPROL -XL) 50 MG 24 hr tablet Take 1 tablet (50 mg total) by mouth in the morning and at bedtime. Take  with or immediately following a meal. 180 tablet 3   nitroGLYCERIN  (NITROSTAT ) 0.4 MG SL tablet Place 1 tablet (0.4 mg total) under the tongue every 5 (five) minutes as needed for chest pain. 25 tablet 6   ondansetron  (ZOFRAN -ODT) 4 MG disintegrating tablet Take 1 tablet (4 mg total) by mouth every 8 (eight) hours as needed for nausea or vomiting. 15 tablet 0   OVER THE COUNTER MEDICATION Place 1 drop into both eyes 2 (two) times daily. Happy body  liquid msm eye drops/ for floaters in the eyes     rosuvastatin  (CRESTOR ) 10 MG tablet Take 1 tablet (10 mg total) by mouth daily. 90 tablet 3   sacubitril-valsartan (ENTRESTO ) 24-26 MG TAKE 1 TABLET BY MOUTH TWICE A DAY 180 tablet 3   Spacer/Aero-Hold Chamber Bags MISC 1 Bag by Does not apply route as needed. 1 Bag 3   tamsulosin  (FLOMAX ) 0.4 MG CAPS capsule TAKE 1 CAPSULE (0.4 MG TOTAL) BY MOUTH DAILY. FOR URINE FLOW. 90 capsule 3   No current facility-administered medications on file prior to visit.    BP 132/68   Pulse 67   Temp 98 F (36.7 C) (Oral)   Ht 5' 8 (1.727 m)   Wt 210 lb 8 oz (95.5 kg)   SpO2 98%   BMI 32.01 kg/m  Objective:   Physical Exam Cardiovascular:     Rate and Rhythm: Normal rate and regular rhythm.  Pulmonary:     Effort: Pulmonary effort is normal.     Breath sounds: Normal breath sounds.  Musculoskeletal:     Cervical back: Neck supple.  Skin:    General: Skin is warm and dry.  Neurological:     Mental Status: He is alert and oriented to person, place, and time.  Psychiatric:        Mood and Affect: Mood normal.     Physical Exam        Assessment & Plan:  Type 2 diabetes mellitus with stage 2 chronic kidney disease, with long-term current use of insulin  (HCC) Assessment & Plan: Deteriorated and remains uncontrolled with A1c of 9.2 today.  I do  suspect this is largely secondary to his poor diet and regular consumption of candy/cookies. We discussed to eliminate candy bars and cookies, increase protein in all meals to prevent glucose drops.  Continue Tresiba  100 units daily, NovoLog  20 units 3 times daily with meals Increase Ozempic  to 2 mg weekly  Will consult with pharmacy team.   Follow-up in 3 months  Orders: -     POCT glycosylated hemoglobin (Hb A1C) -     Ozempic  (2 MG/DOSE); Inject 2 mg into the skin once a week. for diabetes.  Dispense: 9 mL; Refill: 1    Assessment and Plan Assessment & Plan         Comer MARLA Gaskins, NP     History of Present Illness

## 2023-11-26 NOTE — Progress Notes (Deleted)
 Referring Physician:  Gretta Comer POUR, NP [address] [place],  [place] [address]  Primary Physician:  Gretta Comer POUR, NP  History of Present Illness: 11/26/2023 [name] has a history of afib, CAD, heart failure with reduced EF, HTN, junctional bradycardia, ischemic cardiomyopathy, IBS, NASH, DM, BPH, hyperlipidemia, h/o CABG.   He has a cardiac defibrillator.   Last seen by me on 09/05/23 for back and bilateral leg pain.  He has known mild central stenosis L2-L3 and L5-S1 he has moderate central stenosis L3-L4 along with multilevel lateral recess stenosis.   I thought he had left GTB as well.   He declined PT and referral to ortho. He was referred to pain management.   He was started on neurontin  by pain management. He had L3-L4 IL ESI with Dr. Marcelino on 11/12/23.   He is here for follow up.       He has chronic constant LBP with  bilateral posterior leg pain to his feet x 3 years and is getting worse. LBP > leg pain. No numbness. Has tingling in his toes. Feels like legs are weak when pain is severe. He has some right sided hip pain as well. Pain is worse with walking and prolonged sitting. No alleviating factors.   He is on PLAVIX  and ELIQUIS .   Tobacco use:  Does not smoke.   Bowel/Bladder Dysfunction: none  Conservative measures:  Physical therapy:  has not participated in recently, was given HEP by sports medicine Multimodal medical therapy including regular antiinflammatories:  Advil Injections:   11/12/23:  L3-L4 IL ESI with Dr. Marcelino   Past Surgery: no spinal surgeries  [name] has no symptoms of cervical myelopathy.  The symptoms are causing a significant impact on the patient's life.   Review of Systems:  A 10 point review of systems is negative, except for the pertinent positives and negatives detailed in the HPI.  Past Medical History: Past Medical History:  Diagnosis Date   Arthritis    fingers, shoulders (08/03/2015)    Atrial fibrillation (HCC)    Paroxysmal, rare episodes, sinus rhythm on flecainide , patient prefers not to take Coumadin   Bradycardia    April, 2013   Chest pain    Nuclear, 2006, no ischemia   Chronic lower back pain    all my life   Coronary artery disease    s/p staged cath 05/12/2014 and 5/11, DES x 2 to heavily calcified RCA, residual with 60% prox LAD, 70% D1   Ejection fraction    EF 55-60%,  echo, January, 2011   Elevated CPK    CPK elevated with normal MB and normal troponin the past   Heart murmur    History of echocardiogram    Echo 8/16: EF 55%, normal wall motion, grade 2 diastolic dysfunction, trivial MR, normal RV function, PASP 27 mmHg   Hypertension    IBS (irritable bowel syndrome)    Myocardial infarction (HCC) 06/2014   NSTEMI (non-ST elevated myocardial infarction) (HCC) 08/02/2015   Sinus drainage    Type II diabetes mellitus (HCC)    Unstable angina (HCC) 01/05/2019    Past Surgical History: Past Surgical History:  Procedure Laterality Date   APPENDECTOMY     CARDIAC CATHETERIZATION N/A 05/12/2014   Procedure: Left Heart Cath and Coronary Angiography;  Surgeon: Debby DELENA Sor, MD;  Location: [facility];  Service: Cardiovascular;  Laterality: N/A;   CARDIAC CATHETERIZATION N/A 05/13/2014   Procedure: Coronary/Graft  Atherectomy;  Surgeon: Debby DELENA Sor, MD;  Location: [facility] INVASIVE CV LAB;  Service: Cardiovascular;  Laterality: N/A;   CARDIAC CATHETERIZATION Right 05/13/2014   Procedure: Temporary Pacemaker;  Surgeon: Debby DELENA Sor, MD;  Location: [facility];  Service: Cardiovascular;  Laterality: Right;   CARDIAC CATHETERIZATION N/A 05/13/2014   Procedure: Coronary Stent Intervention;  Surgeon: Debby DELENA Sor, MD;  Location: [facility];  Service: Cardiovascular;  Laterality: N/A;   CARDIAC CATHETERIZATION N/A 06/18/2014   Procedure: Left Heart Cath and Coronary Angiography;  Surgeon: Peter M Jordan, MD;  Location: [facility] INVASIVE CV  LAB;  Service: Cardiovascular;  Laterality: N/A;   CARDIAC CATHETERIZATION N/A 08/03/2015   Procedure: Left Heart Cath and Cors/Grafts Angiography;  Surgeon: Lonni Hanson, MD;  Location: [facility] INVASIVE CV LAB;  Service: Cardiovascular;  Laterality: N/A;   CARDIOVASCULAR STRESS TEST  05/06/2014   CARDIOVERSION N/A 02/21/2022   Procedure: CARDIOVERSION;  Surgeon: Hobart Powell BRAVO, MD;  Location: [facility] ENDOSCOPY;  Service: Cardiovascular;  Laterality: N/A;   CATARACT EXTRACTION W/ INTRAOCULAR LENS IMPLANT Left    COLONOSCOPY WITH PROPOFOL  N/A 02/13/2020   Procedure: COLONOSCOPY WITH PROPOFOL ;  Surgeon: Rollin Dover, MD;  Location: [facility] ENDOSCOPY;  Service: Endoscopy;  Laterality: N/A;   CORONARY ARTERY BYPASS GRAFT N/A 06/22/2014   Procedure: CORONARY ARTERY BYPASS GRAFTING (CABG) x five, using left internal mammary artery, and right leg greater saphenous vein harvested endoscopically;  Surgeon: Elspeth JAYSON Millers, MD;  Location: [facility] OR;  Service: Open Heart Surgery;  Laterality: N/A;   CORONARY STENT INTERVENTION N/A 07/03/2023   Procedure: CORONARY STENT INTERVENTION;  Surgeon: Elmira Newman PARAS, MD;  [facility]: [facility];  Service: Cardiovascular;  Laterality: N/A;   ICD IMPLANT N/A 01/07/2019   Procedure: ICD IMPLANT;  Surgeon: Kelsie Agent, MD;  Location: [facility];  Service: Cardiovascular;  Laterality: N/A;   LAPAROSCOPIC CHOLECYSTECTOMY     LEFT HEART CATH AND CORS/GRAFTS ANGIOGRAPHY N/A 01/06/2019   Procedure: LEFT HEART CATH AND CORS/GRAFTS ANGIOGRAPHY;  Surgeon: Hanson Lonni, MD;  Location: [facility];  Service: Cardiovascular;  Laterality: N/A;   LEFT HEART CATH AND CORS/GRAFTS ANGIOGRAPHY N/A 07/03/2023   Procedure: LEFT HEART CATH AND CORS/GRAFTS ANGIOGRAPHY;  Surgeon: Elmira Newman PARAS, MD;  Location: [facility];  Service: Cardiovascular;  Laterality: N/A;   MAZE N/A 06/22/2014   Procedure: MAZE;  Surgeon: Elspeth JAYSON Millers, MD;   Location: [facility] OR;  Service: Open Heart Surgery;  Laterality: N/A;   POLYPECTOMY  02/13/2020   Procedure: POLYPECTOMY;  Surgeon: Rollin Dover, MD;  Location: [facility] ENDOSCOPY;  Service: Endoscopy;;   TEE WITHOUT CARDIOVERSION N/A 06/22/2014   Procedure: TRANSESOPHAGEAL ECHOCARDIOGRAM (TEE);  Surgeon: Elspeth JAYSON Millers, MD;  Location: [facility] OR;  Service: Open Heart Surgery;  Laterality: N/A;    Allergies: Allergies as of 12/05/2023 - Review Complete 11/16/2023  Allergen Reaction Noted   Lipitor [atorvastatin ] Other (See Comments) 09/06/2011   Pravastatin  Other (See Comments) 06/18/2014   Simvastatin  Other (See Comments) 06/18/2014    Medications: Outpatient Encounter Medications as of 12/05/2023  Medication Sig   albuterol  (VENTOLIN  HFA) 108 (90 Base) MCG/ACT inhaler Inhale 2 puffs into the lungs every 6 (six) hours as needed for wheezing or shortness of breath.   clopidogrel  (PLAVIX ) 75 MG tablet Take 1 tablet (75 mg total) by mouth daily.   Continuous Glucose Sensor (FREESTYLE LIBRE 3 PLUS SENSOR) MISC Use to check blood sugar continuously. Change sensor every 15 days.  dapagliflozin  propanediol (FARXIGA ) 10 MG TABS tablet Take 1 tablet (10 mg total) by mouth daily before breakfast. For diabetes.   ELIQUIS  5 MG TABS tablet TAKE 1 TABLET BY MOUTH TWICE A DAY   Evolocumab  (REPATHA  SURECLICK) 140 MG/ML SOAJ Inject 140 mg into the skin every 14 (fourteen) days.   gabapentin  (NEURONTIN ) 100 MG capsule Take 2 capsules (200 mg total) by mouth 3 (three) times daily.   ibuprofen (ADVIL) 200 MG tablet Take 200 mg by mouth every 8 (eight) hours as needed.   insulin  aspart (NOVOLOG  FLEXPEN) 100 UNIT/ML FlexPen Inject 20 Units into the skin 3 (three) times daily with meals.   insulin  degludec (TRESIBA  FLEXTOUCH) 200 UNIT/ML FlexTouch Pen Inject 100 Units into the skin daily. Via Novo Cares PAP   Insulin  Pen Needle 31G X 8 MM MISC Use as directed with Lantus .   metoprolol  succinate (TOPROL -XL) 50 MG 24 hr  tablet Take 1 tablet (50 mg total) by mouth in the morning and at bedtime. Take with or immediately following a meal.   nitroGLYCERIN  (NITROSTAT ) 0.4 MG SL tablet Place 1 tablet (0.4 mg total) under the tongue every 5 (five) minutes as needed for chest pain.   ondansetron  (ZOFRAN -ODT) 4 MG disintegrating tablet Take 1 tablet (4 mg total) by mouth every 8 (eight) hours as needed for nausea or vomiting.   OVER THE COUNTER MEDICATION Place 1 drop into both eyes 2 (two) times daily. Happy body  liquid msm eye drops/ for floaters in the eyes   rosuvastatin  (CRESTOR ) 10 MG tablet Take 1 tablet (10 mg total) by mouth daily.   sacubitril-valsartan (ENTRESTO ) 24-26 MG TAKE 1 TABLET BY MOUTH TWICE A DAY   Semaglutide , 2 MG/DOSE, (OZEMPIC , 2 MG/DOSE,) 8 MG/3ML SOPN Inject 2 mg into the skin once a week. for diabetes.   Spacer/Aero-Hold Chamber Bags MISC 1 Bag by Does not apply route as needed.   tamsulosin  (FLOMAX ) 0.4 MG CAPS capsule TAKE 1 CAPSULE (0.4 MG TOTAL) BY MOUTH DAILY. FOR URINE FLOW.   No facility-administered encounter medications on file as of 12/05/2023.    Social History: Social History   Tobacco Use   Smoking status: Never   Smokeless tobacco: Never  Vaping Use   Vaping status: Never Used  Substance Use Topics   Alcohol use: No   Drug use: No    Family Medical History: Family History  Problem Relation Age of Onset   Alzheimer's disease Mother    Emphysema Father    Cancer Brother    Arrhythmia Sister    Heart attack Sister    Heart disease Sister    Hyperlipidemia Sister    Hypertension Sister     Physical Examination: There were no vitals filed for this visit.    Awake, alert, oriented to person, place, and time.  Speech is clear and fluent. Fund of knowledge is appropriate.   Cranial Nerves: Pupils equal round and reactive to light.  Facial tone is symmetric.    Diffuse lower posterior lumbar tenderness.   No abnormal lesions on exposed skin.    Strength: Side Biceps Triceps Deltoid Interossei Grip Wrist Ext. Wrist Flex.  R 5 5 5 5 5 5 5   L 5 5 5 5 5 5 5    Side Iliopsoas Quads Hamstring PF DF EHL  R 5 5 5 5 5 5   L 5 5 5 5 5 5    Reflexes are 2+ and symmetric at the biceps, brachioradialis, patella and achilles.   Hoffman's is  absent.  Clonus is not present.   Bilateral upper and lower extremity sensation is intact to light touch.     No pain with IR/ER of bilateral hips.   He can heel stand on both feet. He can toe stand on both right/left independently.   He has point tenderness over left GTB. No tenderness over right GTB.   Gait is slow.   Medical Decision Making  Imaging: none  Assessment and Plan: [name] chronic constant LBP with  bilateral posterior leg pain to his feet x 3 years and is getting worse. LBP > leg pain. Pain is worse with walking and prolonged sitting.   He has known mild central stenosis L2-L3 and L5-S1 he has moderate central stenosis L3-L4 along with multilevel lateral recess stenosis.   He has point tenderness over left GTB and this pain is likely due to greater trochanteric bursitis.   Treatment options discussed with patient and following plan made:   - PT recommended and he declines.  - Recommend referral to ortho for left GTB. He declines.  - Referral to pain management at [facility]) to consider lumbar injections.  - Of note, he is on PLAVIX  and ELIQUIS .  - Follow up with me in 8-12 weeks for recheck.   I spent a total of 35 minutes in face-to-face and non-face-to-face activities related to this patient's care today including review of outside records, review of imaging, review of symptoms, physical exam, discussion of differential diagnosis, discussion of treatment options, and documentation.   Thank you for involving me in the care of this patient.   Glade Boys PA-C Dept. of Neurosurgery

## 2023-12-05 ENCOUNTER — Ambulatory Visit: Admitting: Orthopedic Surgery

## 2023-12-10 ENCOUNTER — Ambulatory Visit: Admitting: Nurse Practitioner

## 2023-12-17 ENCOUNTER — Other Ambulatory Visit: Payer: Self-pay

## 2023-12-17 MED ORDER — ELIQUIS 5 MG PO TABS: 5.0000 mg | ORAL_TABLET | Freq: Two times a day (BID) | ORAL | 5 refills | Status: AC

## 2023-12-17 NOTE — Telephone Encounter (Signed)
 Prescription refill request for Eliquis  received. Indication:afib Last office visit:8/25 Scr: 1.01  7/25 Age:78 Weight:95.5  kg  Prescription refilled

## 2023-12-19 ENCOUNTER — Other Ambulatory Visit: Payer: Self-pay

## 2023-12-20 MED ORDER — ROSUVASTATIN CALCIUM 10 MG PO TABS: 10.0000 mg | ORAL_TABLET | Freq: Every day | ORAL | 3 refills | Status: AC

## 2024-01-01 ENCOUNTER — Encounter: Payer: Self-pay | Admitting: Pharmacist

## 2024-01-01 NOTE — Progress Notes (Signed)
 Brief Telephone Documentation Reason for Call: Patient/Medication Assistance   Summary of Call: Called patient 12/30. Discussed Novo is no longer offering the Ozempic  in 2026.   Patient confirms taking Tresiba  and Novolog . Confirms taking Ozempic  though this is less clear. No recent shipments to clinic. Notes his dose is 100 mg which is the Tresiba  daily dosing.   He reports he is on his last box of Ozempic  though the box was Red (red = 0.25/0.5 mg dose) which is not a recently-prescribed dose. Cannot recall which dose he is injecting each week. Notes he threw away the box so cannot confirm strength.   Assessment/Plan for 2026:  Previously tolerated Trulicity  well though PAP was not available at the time so he was switched to Ozempic . Discussed Trulicity  PAP as an option for 2026 to which he is agreeable.   Will plan to restart titration given unclear recent history of Ozempic /dosing (online phone system states he was not enrolled in 2025 for the Ozempic . No recent shipments to clinic, last documented was Jan 2024). No refill hx at pharmacy.   Close follow up via phone with PharmD for Trulicity  start/titrations given concurrent insulin  use.   Follow Up: Will plan for regular follow up once approved for Trulicity  via PAP.  Patient given direct line for further questions/concerns.  Manuelita FABIENE Kobs, PharmD, BCACP, CPP Clinical Pharmacist Practitioner [facility] Ph: [phone]

## 2024-01-01 NOTE — Progress Notes (Signed)
 Printed both PAPs and placed in Kate's in-box in her office to sign.

## 2024-01-01 NOTE — Progress Notes (Signed)
 Patient Assistance Program (PAP) Application   Manufacturer: Secretary/administrator    (New enrollment) Medication(s): Trulicity  (replaces Ozempic  2026)   Patient Portion of Application:  12/30: Completed with patient via online enrollment tool. Submitted.  Income Documentation: N/A - Electronic verification elected.  Provider Portion of Application:  12/30: Provider portion completed by PharmD and uploaded PCP eFax folder for signature.  Prescription(s): Included in MAP application. Refills can be sent electronically to [facility]   ____________________________________________________________  Patient Assistance Program (PAP) Application   Manufacturer: Novo Nordisk    (Re-enrollment) Medication(s): Tresiba , Novolog   Patient Portion of Application:  01/01/24: Completed with patient via online enrollment tool. Submitted.  Income Documentation: N/A - Electronic verification elected.  Provider Portion of Application:  01/01/24: Provider portion completed by PharmD and uploaded PCP eFax folder for signature.  Prescription(s): Included in MAP application. Tresiba  U200. 100 units daily Novolog . 20 units TIDAC Novofine pen needle    Next Steps: [x]    Provider Application filled out and uploaded to [employer] for review/signature []    Upon PCP signature Application to be faxed to: [facility] Advocate Team: [phone]  AND scanned to chart    Note routed to PCP Clinic Pool to ensure PCP signature is obtained and application is faxed.  [facility] PAP spreadsheet updated

## 2024-01-02 ENCOUNTER — Other Ambulatory Visit (HOSPITAL_COMMUNITY): Payer: Self-pay

## 2024-01-02 NOTE — Progress Notes (Signed)
 "  Referring Physician:  Gretta Comer POUR, NP [address] [place],  [place] [address]  Primary Physician:  Gretta Comer POUR, NP  History of Present Illness: Mr. Fletcher Ostermiller has a history of afib, CAD, heart failure with reduced EF, HTN, junctional bradycardia, ischemic cardiomyopathy, IBS, NASH, DM, BPH, hyperlipidemia, h/o CABG.   He has a cardiac defibrillator.   Last seen by me on 09/05/23 for back and bilateral leg pain.  He has known mild central stenosis L2-L3 and L5-S1 he has moderate central stenosis L3-L4 along with multilevel lateral recess stenosis.   I thought he had left GTB as well.   He declined PT and referral to ortho. He was referred to pain management.   He was started on neurontin  by pain management. He had L3-L4 IL ESI with Dr. Marcelino on 11/12/23.   He is here for follow up.   He had improvement in his back pain by 80% with his lumbar ESI!  He continues with LBP/buttock pain (it's much better but still present) with bilateral posterior leg pain to his feet. Feels like legs are weak when pain is severe. He has some bilateral hip pain as well. Pain is worse with walking and standing.  He is on PLAVIX  and ELIQUIS .   Tobacco use:  Does not smoke.   Bowel/Bladder Dysfunction: none  Conservative measures:  Physical therapy:  has not participated in recently, was given HEP by sports medicine Multimodal medical therapy including regular antiinflammatories:  Advil Injections:   11/12/23:  L3-L4 IL ESI with Dr. Marcelino   Past Surgery: no spinal surgeries  [name] has no symptoms of cervical myelopathy.  The symptoms are causing a significant impact on the patient's life.   Review of Systems:  A 10 point review of systems is negative, except for the pertinent positives and negatives detailed in the HPI.  Past Medical History: Past Medical History:  Diagnosis Date   Arthritis    fingers, shoulders (08/03/2015)   Atrial fibrillation (HCC)     Paroxysmal, rare episodes, sinus rhythm on flecainide , patient prefers not to take Coumadin   Bradycardia    April, 2013   Chest pain    Nuclear, 2006, no ischemia   Chronic lower back pain    all my life   Coronary artery disease    s/p staged cath 05/12/2014 and 5/11, DES x 2 to heavily calcified RCA, residual with 60% prox LAD, 70% D1   Ejection fraction    EF 55-60%,  echo, January, 2011   Elevated CPK    CPK elevated with normal MB and normal troponin the past   Heart murmur    History of echocardiogram    Echo 8/16: EF 55%, normal wall motion, grade 2 diastolic dysfunction, trivial MR, normal RV function, PASP 27 mmHg   Hypertension    IBS (irritable bowel syndrome)    Myocardial infarction (HCC) 06/2014   NSTEMI (non-ST elevated myocardial infarction) (HCC) 08/02/2015   Sinus drainage    Type II diabetes mellitus (HCC)    Unstable angina (HCC) 01/05/2019    Past Surgical History: Past Surgical History:  Procedure Laterality Date   APPENDECTOMY     CARDIAC CATHETERIZATION N/A 05/12/2014   Procedure: Left Heart Cath and Coronary Angiography;  Surgeon: Debby DELENA Sor, MD;  Location: [facility];  Service: Cardiovascular;  Laterality: N/A;   CARDIAC CATHETERIZATION N/A 05/13/2014   Procedure: Coronary/Graft Atherectomy;  Surgeon: Debby DELENA Sor, MD;  Location:  [facility];  Service: Cardiovascular;  Laterality: N/A;   CARDIAC CATHETERIZATION Right 05/13/2014   Procedure: Temporary Pacemaker;  Surgeon: Debby DELENA Sor, MD;  Location: [facility];  Service: Cardiovascular;  Laterality: Right;   CARDIAC CATHETERIZATION N/A 05/13/2014   Procedure: Coronary Stent Intervention;  Surgeon: Debby DELENA Sor, MD;  Location: [facility];  Service: Cardiovascular;  Laterality: N/A;   CARDIAC CATHETERIZATION N/A 06/18/2014   Procedure: Left Heart Cath and Coronary Angiography;  Surgeon: Peter M Jordan, MD;  Location: [facility] CV [facility];  Service: Cardiovascular;   Laterality: N/A;   CARDIAC CATHETERIZATION N/A 08/03/2015   Procedure: Left Heart Cath and Cors/Grafts Angiography;  Surgeon: Lonni Hanson, MD;  Location: [facility] INVASIVE CV LAB;  Service: Cardiovascular;  Laterality: N/A;   CARDIOVASCULAR STRESS TEST  05/06/2014   CARDIOVERSION N/A 02/21/2022   Procedure: CARDIOVERSION;  Surgeon: Hobart Powell BRAVO, MD;  Location: [facility] ENDOSCOPY;  Service: Cardiovascular;  Laterality: N/A;   CATARACT EXTRACTION W/ INTRAOCULAR LENS IMPLANT Left    COLONOSCOPY WITH PROPOFOL  N/A 02/13/2020   Procedure: COLONOSCOPY WITH PROPOFOL ;  Surgeon: Rollin Dover, MD;  Location: [facility]OS[facility];  Service: Endoscopy;  Laterality: N/A;   CORONARY ARTERY BYPASS GRAFT N/A 06/22/2014   Procedure: CORONARY ARTERY BYPASS GRAFTING (CABG) x five, using left internal mammary artery, and right leg greater saphenous vein harvested endoscopically;  Surgeon: Elspeth JAYSON Millers, MD;  Location: [facility] OR;  Service: Open Heart Surgery;  Laterality: N/A;   CORONARY STENT INTERVENTION N/A 07/03/2023   Procedure: CORONARY STENT INTERVENTION;  Surgeon: Elmira Newman PARAS, MD;  Location: [facility];  Service: Cardiovascular;  Laterality: N/A;   ICD IMPLANT N/A 01/07/2019   Procedure: ICD IMPLANT;  Surgeon: Kelsie Agent, MD;  Location: [facility];  Service: Cardiovascular;  Laterality: N/A;   LAPAROSCOPIC CHOLECYSTECTOMY     LEFT HEART CATH AND CORS/GRAFTS ANGIOGRAPHY N/A 01/06/2019   Procedure: LEFT HEART CATH AND CORS/GRAFTS ANGIOGRAPHY;  Surgeon: Hanson Lonni, MD;  Location: [facility];  Service: Cardiovascular;  Laterality: N/A;   LEFT HEART CATH AND CORS/GRAFTS ANGIOGRAPHY N/A 07/03/2023   Procedure: LEFT HEART CATH AND CORS/GRAFTS ANGIOGRAPHY;  Surgeon: Elmira Newman PARAS, MD;  Location: [facility];  Service: Cardiovascular;  Laterality: N/A;   MAZE N/A 06/22/2014   Procedure: MAZE;  Surgeon: Elspeth JAYSON Millers, MD;  Location: [facility] OR;  Service: Open Heart  Surgery;  Laterality: N/A;   POLYPECTOMY  02/13/2020   Procedure: POLYPECTOMY;  Surgeon: Rollin Dover, MD;  Location: [facility] ENDOSCOPY;  Service: Endoscopy;;   TEE WITHOUT CARDIOVERSION N/A 06/22/2014   Procedure: TRANSESOPHAGEAL ECHOCARDIOGRAM (TEE);  Surgeon: Elspeth JAYSON Millers, MD;  Location: [facility] OR;  Service: Open Heart Surgery;  Laterality: N/A;    Allergies: Allergies as of 01/08/2024 - Review Complete 01/08/2024  Allergen Reaction Noted   Lipitor [atorvastatin ] Other (See Comments) 09/06/2011   Pravastatin  Other (See Comments) 06/18/2014   Simvastatin  Other (See Comments) 06/18/2014    Medications: Outpatient Encounter Medications as of 01/08/2024  Medication Sig   albuterol  (VENTOLIN  HFA) 108 (90 Base) MCG/ACT inhaler Inhale 2 puffs into the lungs every 6 (six) hours as needed for wheezing or shortness of breath.   Azelastine HCl 137 MCG/SPRAY SOLN Place 2 sprays into both nostrils 2 (two) times daily.   clopidogrel  (PLAVIX ) 75 MG tablet Take 1 tablet (75 mg total) by mouth daily.   Continuous Glucose Sensor (FREESTYLE LIBRE 3 PLUS SENSOR) MISC Use to check blood sugar  continuously. Change sensor every 15 days.   dapagliflozin  propanediol (FARXIGA ) 10 MG TABS tablet Take 1 tablet (10 mg total) by mouth daily before breakfast. For diabetes.   ELIQUIS  5 MG TABS tablet Take 1 tablet (5 mg total) by mouth 2 (two) times daily.   gabapentin  (NEURONTIN ) 100 MG capsule Take 2 capsules (200 mg total) by mouth 3 (three) times daily.   ibuprofen (ADVIL) 200 MG tablet Take 200 mg by mouth every 8 (eight) hours as needed.   insulin  aspart (NOVOLOG  FLEXPEN) 100 UNIT/ML FlexPen Inject 20 Units into the skin 3 (three) times daily with meals.   insulin  degludec (TRESIBA  FLEXTOUCH) 200 UNIT/ML FlexTouch Pen Inject 100 Units into the skin daily. Via Novo Cares PAP   Insulin  Pen Needle 31G X 8 MM MISC Use as directed with Lantus .   metoprolol  succinate (TOPROL -XL) 50 MG 24 hr tablet Take 1 tablet (50 mg  total) by mouth in the morning and at bedtime. Take with or immediately following a meal.   nitroGLYCERIN  (NITROSTAT ) 0.4 MG SL tablet Place 1 tablet (0.4 mg total) under the tongue every 5 (five) minutes as needed for chest pain.   ondansetron  (ZOFRAN -ODT) 4 MG disintegrating tablet Take 1 tablet (4 mg total) by mouth every 8 (eight) hours as needed for nausea or vomiting.   OVER THE COUNTER MEDICATION Place 1 drop into both eyes 2 (two) times daily. Happy body  liquid msm eye drops/ for floaters in the eyes   rosuvastatin  (CRESTOR ) 10 MG tablet Take 1 tablet (10 mg total) by mouth daily.   sacubitril-valsartan (ENTRESTO ) 24-26 MG TAKE 1 TABLET BY MOUTH TWICE A DAY   Semaglutide , 2 MG/DOSE, (OZEMPIC , 2 MG/DOSE,) 8 MG/3ML SOPN Inject 2 mg into the skin once a week. for diabetes.   Spacer/Aero-Hold Chamber Bags MISC 1 Bag by Does not apply route as needed.   tamsulosin  (FLOMAX ) 0.4 MG CAPS capsule TAKE 1 CAPSULE (0.4 MG TOTAL) BY MOUTH DAILY. FOR URINE FLOW.   [DISCONTINUED] Evolocumab  (REPATHA  SURECLICK) 140 MG/ML SOAJ Inject 140 mg into the skin every 14 (fourteen) days.   No facility-administered encounter medications on file as of 01/08/2024.    Social History: Social History   Tobacco Use   Smoking status: Never   Smokeless tobacco: Never  Vaping Use   Vaping status: Never Used  Substance Use Topics   Alcohol use: No   Drug use: No    Family Medical History: Family History  Problem Relation Age of Onset   Alzheimer's disease Mother    Emphysema Father    Cancer Brother    Arrhythmia Sister    Heart attack Sister    Heart disease Sister    Hyperlipidemia Sister    Hypertension Sister     Physical Examination: Vitals:   01/08/24 1338  BP: 130/74      Awake, alert, oriented to person, place, and time.  Speech is clear and fluent. Fund of knowledge is appropriate.   Cranial Nerves: Pupils equal round and reactive to light.  Facial tone is symmetric.    No abnormal  lesions on exposed skin.   Strength:   Side Iliopsoas Quads Hamstring PF DF EHL  R 5 5 5 5 5 5   L 5 5 5 5 5 5    Clonus is not present.   Bilateral lower extremity sensation is intact to light touch.     No pain with IR/ER of bilateral hips.   He has point tenderness over left GTB. No  tenderness over right GTB.   Gait is normal but he is slow to start after sitting.   Medical Decision Making  Imaging: none  Assessment and Plan: Mr. Degollado chronic constant LBP with  bilateral posterior leg pain to his feet x 3 years.  He had 80% improvement in LBP with his ESI! He still has LBP/buttock pain (it's much better but still present) with bilateral posterior leg pain to his feet. He has some bilateral hip pain as well, left > right. Pain is worse with walking and standing.  He has known mild central stenosis L2-L3 and L5-S1 he has moderate central stenosis L3-L4 along with multilevel lateral recess stenosis.   He has point tenderness over left GTB and this pain is likely due to greater trochanteric bursitis. No tenderness over right GTB.   Treatment options discussed with patient and following plan made:   - He declines PT and referral to ortho for left GTB.  - Will discuss left hip pain with pain management- they may do GTB injection.  - Of note, he is on PLAVIX  and ELIQUIS .  - Follow up with pain management as scheduled to discuss further injections. He will follow up with me prn.   I spent a total of 20 minutes in face-to-face and non-face-to-face activities related to this patient's care today including review of outside records, review of imaging, review of symptoms, physical exam, discussion of differential diagnosis, discussion of treatment options, and documentation.    Glade Boys PA-C Dept. of Neurosurgery  "

## 2024-01-02 NOTE — Progress Notes (Signed)
 Next Steps: [x]    Provider Application filled out and uploaded to [facility] eFax folder for review/signature [x]    Upon PCP signature Application to be faxed to: [facility] Med[facility] Advocate Team: [phone]  AND scanned to chart      2026 Novo PAP for Insulins (Novolog , Tresiba )    Next Steps: [x]    Provider Application filled out and uploaded to [facility] eFax folder for review/signature [x]    Upon PCP signature Application to be faxed to: [facility] Med[facility] Advocate Team: [phone]  AND scanned to chart

## 2024-01-02 NOTE — Progress Notes (Signed)
 Completed and placed in [name]'s a box.

## 2024-01-06 NOTE — Progress Notes (Unsigned)
 PROVIDER NOTE: Interpretation of information contained herein should be left to medically-trained personnel. Specific patient instructions are provided elsewhere under Patient Instructions section of medical record. This document was created in part using AI and STT-dictation technology, any transcriptional errors that may result from this process are unintentional.  Patient: [name]  Service: E/M   PCP: Gretta Comer POUR, NP  DOB: [birth year]  DOS: 01/09/2024  Provider: Emmy POUR Blanch, NP  MRN: [id number]  Delivery: Face-to-face  Specialty: Interventional Pain Management  Type: Established Patient  Setting: Ambulatory outpatient facility  Specialty designation: 09  Referring Prov.: Gretta Comer POUR, NP  Location: Outpatient office facility       History of present illness (HPI) [name], a 79 y.o. year old male, is here today because of his No primary diagnosis found.. [name] primary complain today is No chief complaint on file.  Pertinent problems: [name] has DM type 2 (diabetes mellitus, type 2) (HCC); Paroxysmal atrial fibrillation (HCC); BPH (benign prostatic hyperplasia); Bursitis of hip; Hypertension; Rotator cuff tendinitis; CAD (coronary artery disease); Chronic bilateral low back pain with bilateral sciatica; Spinal stenosis, lumbar region, with neurogenic claudication; Chronic radicular lumbar pain; Neural foraminal stenosis of lumbar spine; and Chronic pain syndrome on their pertinent problem list.  Pain Assessment: Severity of   is reported as a  /10. Location:    / . Onset:  . Quality:  . Timing:  . Modifying factor(s):  SABRA Vitals:  vitals were not taken for this visit.  BMI: Estimated body mass index is 32.01 kg/m as calculated from the following:   Height as of 11/16/23: 5' 8 (1.727 m).   Weight as of 11/16/23: 210 lb 8 oz (95.5 kg).  Last encounter: Visit date not found. Last procedure: Visit date not found.  Reason for encounter: post-procedure  evaluation and assessment.   Discussed the use of AI scribe software for clinical note transcription with the patient, who gave verbal consent to proceed.  History of Present Illness         Procedure Type: Lumbar epidural steroid injection (LESI) (interlaminar) #1    Laterality: Midline   Level:  L3-4 Level.  Imaging: Fluoroscopic guidance         Anesthesia: Local anesthesia (1-2% Lidocaine ) Sedation: No Sedation                       DOS: 11/12/2023  Performed by: Wallie Sherry, MD   Purpose: Diagnostic/Therapeutic Indications: Lumbar radicular pain of intraspinal etiology of more than 4 weeks that has failed to respond to conservative therapy and is severe enough to impact quality of life or function. 1. Chronic radicular lumbar pain   2. Spinal stenosis, lumbar region, with neurogenic claudication   3. Chronic pain syndrome     NAS-11 Pain score:        Pre-procedure: 6 /10        Post-procedure: 6 /10   Post-Procedure Evaluation  {There is no content from the last Procedure section.}   Effectiveness:  Initial hour after procedure:   ***. Subsequent 4-6 hours post-procedure:   ***. Analgesia past initial 6 hours:   ***. Ongoing improvement:  Analgesic:  *** Function: {Blank single:19197::No benefit,No improvement,Back to baseline,Transient improvement,[name] reports improvement in function,Somewhat improved,Minimal improvement,   ***   } ROM: {Blank single:19197::No benefit,No improvement,Back to baseline,Transient improvement,[name] reports improvement in ROM,Somewhat improved,Minimal improvement,   ***   } Interpretation: ***   Pharmacotherapy  Assessment   {There is no content from the last Subjective section.}  Monitoring: [place] PMP: PDMP not reviewed this encounter.       Pharmacotherapy: No side-effects or adverse reactions reported. Compliance: No problems identified. Effectiveness: Clinically acceptable.  No notes on  file  UDS:  Summary  Date Value Ref Range Status  09/20/2023 FINAL  Final    Comment:    ==================================================================== Compliance Drug Analysis, Ur ==================================================================== Test                             Result       Flag       Units  Drug Present and Declared for Prescription Verification   Metoprolol                      PRESENT      EXPECTED  Drug Absent but Declared for Prescription Verification   Gabapentin                      Not Detected UNEXPECTED   Ibuprofen                      Not Detected UNEXPECTED    Ibuprofen, as indicated in the declared medication list, is not    always detected even when used as directed.  ==================================================================== Test                      Result    Flag   Units      Ref Range   Creatinine              171              mg/dL      >=79 ==================================================================== Declared Medications:  The flagging and interpretation on this report are based on the  following declared medications.  Unexpected results may arise from  inaccuracies in the declared medications.   **Note: The testing scope of this panel includes these medications:   Gabapentin  (Neurontin )  Metoprolol  (Toprol )   **Note: The testing scope of this panel does not include small to  moderate amounts of these reported medications:   Ibuprofen (Advil)   **Note: The testing scope of this panel does not include the  following reported medications:   Albuterol  (Ventolin  HFA)  Apixaban  (Eliquis )  Clopidogrel  (Plavix )  Dapagliflozin  (Farxiga )  Evolocumab  (Repatha )  Eye Drops  Insulin  (NovoLog )  Nitroglycerin  (Nitrostat )  Ondansetron  (Zofran )  Rosuvastatin  (Crestor )  Sacubitril (Entresto )  Semaglutide   Tamsulosin  (Flomax )  Valsartan  (Entresto ) ==================================================================== For clinical consultation, please call [phone]. ====================================================================     No results found for: CBDTHCR No results found for: D8THCCBX No results found for: D9THCCBX  ROS  Constitutional: Denies any fever or chills Gastrointestinal: No reported hemesis, hematochezia, vomiting, or acute GI distress Musculoskeletal: Denies any acute onset joint swelling, redness, loss of ROM, or weakness Neurological: No reported episodes of acute onset apraxia, aphasia, dysarthria, agnosia, amnesia, paralysis, loss of coordination, or loss of consciousness  Medication Review  Evolocumab , FreeStyle Libre 3 Plus Sensor, Insulin  Pen Needle, OVER THE COUNTER MEDICATION, Semaglutide  (2 MG/DOSE), Spacer/Aero-Hold Chamber Bags, albuterol , apixaban , clopidogrel , dapagliflozin  propanediol, gabapentin , ibuprofen, insulin  aspart, insulin  degludec, metoprolol  succinate, nitroGLYCERIN , ondansetron , rosuvastatin , sacubitril-valsartan, and tamsulosin   History Review  Allergy: [name] is allergic to lipitor [atorvastatin ], pravastatin , and simvastatin . Drug: [name]  reports no history of drug use.  Alcohol:  reports no history of alcohol use. Tobacco:  reports that he has never smoked. He has never used smokeless tobacco. Social: [name]  reports that he has never smoked. He has never used smokeless tobacco. He reports that he does not drink alcohol and does not use drugs. Medical:  has a past medical history of Arthritis, Atrial fibrillation (HCC), Bradycardia, Chest pain, Chronic lower back pain, Coronary artery disease, Ejection fraction, Elevated CPK, Heart murmur, History of echocardiogram, Hypertension, IBS (irritable bowel syndrome), Myocardial infarction (HCC) (06/2014), NSTEMI (non-ST elevated myocardial infarction) (HCC) (08/02/2015), Sinus drainage, Type II diabetes  mellitus (HCC), and Unstable angina (HCC) (01/05/2019). Surgical: [name]  has a past surgical history that includes Cardiovascular stress test (05/06/2014); Appendectomy; TEE without cardioversion (N/A, 06/22/2014); MAZE (N/A, 06/22/2014); Laparoscopic cholecystectomy; Cataract extraction w/ intraocular lens implant (Left); Coronary artery bypass graft (N/A, 06/22/2014); Cardiac catheterization (N/A, 05/12/2014); Cardiac catheterization (N/A, 05/13/2014); Cardiac catheterization (Right, 05/13/2014); Cardiac catheterization (N/A, 05/13/2014); Cardiac catheterization (N/A, 06/18/2014); Cardiac catheterization (N/A, 08/03/2015); LEFT HEART CATH AND CORS/GRAFTS ANGIOGRAPHY (N/A, 01/06/2019); ICD IMPLANT (N/A, 01/07/2019); Colonoscopy with propofol  (N/A, 02/13/2020); polypectomy (02/13/2020); Cardioversion (N/A, 02/21/2022); LEFT HEART CATH AND CORS/GRAFTS ANGIOGRAPHY (N/A, 07/03/2023); and CORONARY STENT INTERVENTION (N/A, 07/03/2023). Family: family history includes Alzheimer's disease in his mother; Arrhythmia in his sister; Cancer in his brother; Emphysema in his father; Heart attack in his sister; Heart disease in his sister; Hyperlipidemia in his sister; Hypertension in his sister.  Laboratory Chemistry Profile   Renal Lab Results  Component Value Date   BUN 19 07/10/2023   CREATININE 1.01 07/10/2023   BCR 19 07/10/2023   GFR 53.87 (L) 11/30/2021   GFRAA >60 01/07/2019   GFRNONAA >60 06/24/2023    Hepatic Lab Results  Component Value Date   AST 37 06/24/2023   ALT 70 (H) 06/24/2023   ALBUMIN  3.7 06/24/2023   ALKPHOS 73 06/24/2023   HCVAB NEGATIVE 03/10/2015   LIPASE 30.0 11/30/2021    Electrolytes Lab Results  Component Value Date   NA 144 07/10/2023   K 4.1 07/10/2023   CL 104 07/10/2023   CALCIUM  9.0 07/10/2023   MG 2.3 06/23/2014    Bone No results found for: VD25OH, VD125OH2TOT, CI6874NY7, CI7874NY7, 25OHVITD1, 25OHVITD2, 25OHVITD3, TESTOFREE, TESTOSTERONE   Inflammation (CRP: Acute Phase) (ESR: Chronic Phase) Lab Results  Component Value Date   ESRSEDRATE 4 07/28/2011         Note: Above Lab results reviewed.  Recent Imaging Review  DG PAIN CLINIC C-ARM 1-60 MIN NO REPORT Fluoro was used, but no Radiologist interpretation will be provided.  Please refer to NOTES tab for provider progress note. Note: Reviewed        Physical Exam  Vitals: There were no vitals taken for this visit. BMI: Estimated body mass index is 32.01 kg/m as calculated from the following:   Height as of 11/16/23: 5' 8 (1.727 m).   Weight as of 11/16/23: 210 lb 8 oz (95.5 kg). Ideal: Patient weight not recorded General appearance: Well nourished, well developed, and well hydrated. In no apparent acute distress Mental status: Alert, oriented x 3 (person, place, & time)       Respiratory: No evidence of acute respiratory distress Eyes: PERLA   Assessment   Diagnosis Status  No diagnosis found. Controlled Controlled Controlled   Updated Problems: No problems updated.  Plan of Care  Problem-specific:  Assessment and Plan            [name] has a  current medication list which includes the following long-term medication(s): albuterol , eliquis , gabapentin , metoprolol  succinate, nitroglycerin , and rosuvastatin .  Pharmacotherapy (Medications Ordered): No orders of the defined types were placed in this encounter.  Orders:  No orders of the defined types were placed in this encounter.    {There is no content from the last Plan section.}   No follow-ups on file.    Recent Visits Date Type Provider Dept  11/12/23 Procedure visit Marcelino Nurse, MD [facility]  10/18/23 Office Visit Marcelino Nurse, MD [facility]  Showing recent visits within past 90 days and meeting all other requirements Future Appointments Date Type Provider Dept  01/09/24 Appointment Hakeen Shipes K, NP [facility]  Showing future  appointments within next 90 days and meeting all other requirements  I discussed the assessment and treatment plan with the patient. The patient was provided an opportunity to ask questions and all were answered. The patient agreed with the plan and demonstrated an understanding of the instructions.  Patient advised to call back or seek an in-person evaluation if the symptoms or condition worsens.  Duration of encounter: *** minutes.  Total time on encounter, as per AMA guidelines included both the face-to-face and non-face-to-face time personally spent by the physician and/or other qualified health care professional(s) on the day of the encounter (includes time in activities that require the physician or other qualified health care professional and does not include time in activities normally performed by clinical staff). Physician's time may include the following activities when performed: Preparing to see the patient (e.g., pre-charting review of records, searching for previously ordered imaging, lab work, and nerve conduction tests) Review of prior analgesic pharmacotherapies. Reviewing PMP Interpreting ordered tests (e.g., lab work, imaging, nerve conduction tests) Performing post-procedure evaluations, including interpretation of diagnostic procedures Obtaining and/or reviewing separately obtained history Performing a medically appropriate examination and/or evaluation Counseling and educating the patient/family/caregiver Ordering medications, tests, or procedures Referring and communicating with other health care professionals (when not separately reported) Documenting clinical information in the electronic or other health record Independently interpreting results (not separately reported) and communicating results to the patient/ family/caregiver Care coordination (not separately reported)  Note by: Carollynn Pennywell K Chiquitta Matty, NP (TTS and AI technology used. I apologize for any typographical errors  that were not detected and corrected.) Date: 01/09/2024; Time: 2:22 PM

## 2024-01-07 ENCOUNTER — Ambulatory Visit: Payer: Medicare Other

## 2024-01-07 DIAGNOSIS — I480 Paroxysmal atrial fibrillation: Secondary | ICD-10-CM

## 2024-01-08 ENCOUNTER — Ambulatory Visit: Admitting: Orthopedic Surgery

## 2024-01-08 ENCOUNTER — Encounter: Payer: Self-pay | Admitting: Orthopedic Surgery

## 2024-01-08 VITALS — BP 130/74 | Ht 68.0 in | Wt 211.4 lb

## 2024-01-08 DIAGNOSIS — M7062 Trochanteric bursitis, left hip: Secondary | ICD-10-CM | POA: Diagnosis not present

## 2024-01-08 DIAGNOSIS — M47816 Spondylosis without myelopathy or radiculopathy, lumbar region: Secondary | ICD-10-CM

## 2024-01-08 DIAGNOSIS — M7061 Trochanteric bursitis, right hip: Secondary | ICD-10-CM | POA: Diagnosis not present

## 2024-01-08 DIAGNOSIS — M48062 Spinal stenosis, lumbar region with neurogenic claudication: Secondary | ICD-10-CM | POA: Diagnosis not present

## 2024-01-08 DIAGNOSIS — M4726 Other spondylosis with radiculopathy, lumbar region: Secondary | ICD-10-CM

## 2024-01-08 DIAGNOSIS — M5416 Radiculopathy, lumbar region: Secondary | ICD-10-CM

## 2024-01-08 NOTE — Patient Instructions (Signed)
 It was so nice to see you today. Thank you so much for coming in.    Follow up with Dr. Marcelino as scheduled tomorrow. Let him know about your left hip pain, he may be able to do injection for your bursitis.   You can continue to see Dr. Marcelino and follow up with me as needed.   Please do not hesitate to call if you have any questions or concerns. You can also message me in MyChart.   Glade Boys PA-C [phone]     The physicians and staff at [facility] at [facility] are committed to providing excellent care. You may receive a survey asking for feedback about your experience at our office. We value you your feedback and appreciate you taking the time to to fill it out. The [facility] leadership team is also available to discuss your experience in person, feel free to contact us  [phone].

## 2024-01-09 ENCOUNTER — Ambulatory Visit: Attending: Nurse Practitioner | Admitting: Nurse Practitioner

## 2024-01-09 ENCOUNTER — Other Ambulatory Visit: Payer: Self-pay | Admitting: Pharmacist

## 2024-01-09 ENCOUNTER — Other Ambulatory Visit: Payer: Self-pay | Admitting: Primary Care

## 2024-01-09 ENCOUNTER — Encounter: Payer: Self-pay | Admitting: Nurse Practitioner

## 2024-01-09 ENCOUNTER — Ambulatory Visit: Payer: Self-pay | Admitting: Student in an Organized Health Care Education/Training Program

## 2024-01-09 VITALS — BP 127/74 | HR 80 | Temp 97.8°F | Resp 18 | Ht 68.0 in | Wt 210.0 lb

## 2024-01-09 DIAGNOSIS — I2510 Atherosclerotic heart disease of native coronary artery without angina pectoris: Secondary | ICD-10-CM | POA: Insufficient documentation

## 2024-01-09 DIAGNOSIS — E1122 Type 2 diabetes mellitus with diabetic chronic kidney disease: Secondary | ICD-10-CM

## 2024-01-09 DIAGNOSIS — M48062 Spinal stenosis, lumbar region with neurogenic claudication: Secondary | ICD-10-CM | POA: Diagnosis present

## 2024-01-09 DIAGNOSIS — G8929 Other chronic pain: Secondary | ICD-10-CM | POA: Insufficient documentation

## 2024-01-09 DIAGNOSIS — G894 Chronic pain syndrome: Secondary | ICD-10-CM | POA: Insufficient documentation

## 2024-01-09 DIAGNOSIS — Z951 Presence of aortocoronary bypass graft: Secondary | ICD-10-CM | POA: Insufficient documentation

## 2024-01-09 DIAGNOSIS — M48061 Spinal stenosis, lumbar region without neurogenic claudication: Secondary | ICD-10-CM | POA: Insufficient documentation

## 2024-01-09 DIAGNOSIS — M5416 Radiculopathy, lumbar region: Secondary | ICD-10-CM | POA: Diagnosis present

## 2024-01-09 LAB — CUP PACEART REMOTE DEVICE CHECK
Battery Remaining Longevity: 89 mo
Battery Voltage: 2.99 V
Brady Statistic RV Percent Paced: 0.13 %
Date Time Interrogation Session: 20260105033523
HighPow Impedance: 65 Ohm
Implantable Lead Connection Status: 753985
Implantable Lead Implant Date: 20210105
Implantable Lead Location: 753860
Implantable Pulse Generator Implant Date: 20210105
Lead Channel Impedance Value: 342 Ohm
Lead Channel Impedance Value: 437 Ohm
Lead Channel Pacing Threshold Amplitude: 0.5 V
Lead Channel Pacing Threshold Pulse Width: 0.4 ms
Lead Channel Sensing Intrinsic Amplitude: 11.125 mV
Lead Channel Sensing Intrinsic Amplitude: 11.125 mV
Lead Channel Setting Pacing Amplitude: 2.5 V
Lead Channel Setting Pacing Pulse Width: 0.4 ms
Lead Channel Setting Sensing Sensitivity: 0.3 mV
Zone Setting Status: 755011
Zone Setting Status: 755011

## 2024-01-09 MED ORDER — METHOCARBAMOL 1000 MG/10ML IJ SOLN
200.0000 mg | Freq: Once | INTRAMUSCULAR | Status: AC
  Administered 2024-01-09: 200 mg via INTRAMUSCULAR
  Filled 2024-01-09: qty 10

## 2024-01-09 MED ORDER — TRESIBA FLEXTOUCH 200 UNIT/ML ~~LOC~~ SOPN: PEN_INJECTOR | SUBCUTANEOUS | 0 refills | Status: DC

## 2024-01-09 MED ORDER — KETOROLAC TROMETHAMINE 60 MG/2ML IM SOLN
60.0000 mg | Freq: Once | INTRAMUSCULAR | Status: AC
  Administered 2024-01-09: 60 mg via INTRAMUSCULAR
  Filled 2024-01-09: qty 2

## 2024-01-09 NOTE — Progress Notes (Signed)
 Noted. Completed and placed in Lisa's inbox.

## 2024-01-09 NOTE — Progress Notes (Signed)
 Chart Review Reason: Drug Question  Summary: Patient presented to clinic with letter from [employer] that was mailed to him (provider application). Informed him that we have submitted the provider portion already so the program should have everything they need.   Reports he has large supply of Novolog  remaining at home, but is running lower on his Tresiba  and is in need of a refill.   Considerations: [employer] Immediate Supply If you are at risk of rationing your insulin  and have an immediate need, you may be eligible for a one-time offer for a free, short-term supply of Novo Nordisk insulin  (30 day supply).  If you are enrolled in a Medicare Part D prescription drug plan Please call [phone] to complete your enrollment.   Provided patient with [employer] Immediate Supply phone number to call.  Reviewed with him that this will work at his local pharmacy to receive one 30-day supply of Novo insulin  per year. Confirms pharmacy preference = [facility].   Insulin  refill pended to [facility]   Manuelita FABIENE Kobs, PharmD, BCACP, CPP Clinical Pharmacist Practitioner [facility] Ph: [phone]

## 2024-01-10 ENCOUNTER — Telehealth: Payer: Self-pay

## 2024-01-10 NOTE — Telephone Encounter (Signed)
 Received via faxed pt portion PAP  [employer] pap was index and [employer] (Trulicity ) provider portion faxed to [employer] today.

## 2024-01-10 NOTE — Progress Notes (Signed)
 Remote ICD Transmission

## 2024-01-10 NOTE — Progress Notes (Signed)
 Faxed form to [facility] PAP team and placed to be scanned.

## 2024-01-11 NOTE — Telephone Encounter (Signed)
 Received approval letter from [facility] on Trulicity  thru 01/01/2025 approval letter index.

## 2024-01-14 ENCOUNTER — Ambulatory Visit: Payer: Medicare Other

## 2024-01-14 VITALS — Ht 68.0 in | Wt 210.0 lb

## 2024-01-14 DIAGNOSIS — Z0000 Encounter for general adult medical examination without abnormal findings: Secondary | ICD-10-CM

## 2024-01-14 NOTE — Progress Notes (Signed)
 "  Chief Complaint  Patient presents with   Medicare Wellness     Subjective:  Please attest and cosign this visit due to patients primary care provider not being in the office at the time the visit was completed.  (Pt of Comer Gaskins, NP)   [name] is a 79 y.o. male who presents for a Medicare Annual Wellness Visit.  Visit info / Clinical Intake: Medicare Wellness Visit Type:: Subsequent Annual Wellness Visit Medicare Wellness Visit Mode:: Telephone If telephone:: video declined Since this visit was completed virtually, some vitals may be partially provided or unavailable. Missing vitals are due to the limitations of the virtual format.: Documented vitals are patient reported If Telephone or Video please confirm:: I connected with patient using audio/video enable telemedicine. I verified patient identity with two identifiers, discussed telehealth limitations, and patient agreed to proceed. Patient Location:: Home Provider Location:: Office Interpreter Needed?: No Pre-visit prep was completed: yes AWV questionnaire completed by patient prior to visit?: no Living arrangements:: lives with spouse/significant other Patient's Overall Health Status Rating: (!) fair Typical amount of pain: some (rating - 4) Does pain affect daily life?: (!) yes Are you currently prescribed opioids?: no  Dietary Habits and Nutritional Risks How many meals a day?: 3 Eats fruit and vegetables daily?: yes Most meals are obtained by: preparing own meals; having others provide food (Spouse cooks) In the last 2 weeks, have you had any of the following?: none Diabetic:: (!) yes Any non-healing wounds?: no How often do you check your BS?: continuous glucose monitor (fasting - 114) Would you like to be referred to a Nutritionist or for Diabetic Management? : no  Functional Status Activities of Daily Living (to include ambulation/medication): Independent Ambulation: Independent with device- listed  below Home Assistive Devices/Equipment: Dentures (specify type); Eyeglasses Medication Administration: Independent Home Management (perform basic housework or laundry): Independent Manage your own finances?: yes Primary transportation is: driving Concerns about vision?: no *vision screening is required for WTM* Concerns about hearing?: (!) yes Uses hearing aids?: (!) yes Hear whispered voice?: yes  Fall Screening Falls in the past year?: 0 Number of falls in past year: 0 Was there an injury with Fall?: 0 Fall Risk Category Calculator: 0 Patient Fall Risk Level: Low Fall Risk  Fall Risk Patient at Risk for Falls Due to: No Fall Risks Fall risk Follow up: Falls evaluation completed; Falls prevention discussed  Home and Transportation Safety: All rugs have non-skid backing?: N/A, no rugs All stairs or steps have railings?: N/A, no stairs Grab bars in the bathtub or shower?: yes Have non-skid surface in bathtub or shower?: yes Good home lighting?: yes Regular seat belt use?: yes Hospital stays in the last year:: no  Cognitive Assessment Difficulty concentrating, remembering, or making decisions? : no Will 6CIT or Mini Cog be Completed: yes What year is it?: 0 points What month is it?: 0 points Give patient an address phrase to remember (5 components): [address] [place], [place] About what time is it?: 0 points Count backwards from 20 to 1: 0 points Say the months of the year in reverse: 0 points Repeat the address phrase from earlier: 0 points 6 CIT Score: 0 points  Advance Directives (For Healthcare) Does Patient Have a Medical Advance Directive?: No Does patient want to make changes to medical advance directive?: No - Guardian declined Type of Advance Directive: Healthcare Power of Attorney Would patient like information on creating a medical advance directive?: No - Patient declined  Reviewed/Updated  Reviewed/Updated: Reviewed All (Medical, Surgical, Family,  Medications, Allergies, Care Teams, Patient Goals)    Allergies (verified) Lipitor [atorvastatin ], Pravastatin , and Simvastatin    Current Medications (verified) Outpatient Encounter Medications as of 01/14/2024  Medication Sig   albuterol  (VENTOLIN  HFA) 108 (90 Base) MCG/ACT inhaler Inhale 2 puffs into the lungs every 6 (six) hours as needed for wheezing or shortness of breath.   Azelastine HCl 137 MCG/SPRAY SOLN Place 2 sprays into both nostrils 2 (two) times daily.   clopidogrel  (PLAVIX ) 75 MG tablet Take 1 tablet (75 mg total) by mouth daily.   Continuous Glucose Sensor (FREESTYLE LIBRE 3 PLUS SENSOR) MISC Use to check blood sugar continuously. Change sensor every 15 days.   dapagliflozin  propanediol (FARXIGA ) 10 MG TABS tablet Take 1 tablet (10 mg total) by mouth daily before breakfast. For diabetes.   ELIQUIS  5 MG TABS tablet Take 1 tablet (5 mg total) by mouth 2 (two) times daily.   gabapentin  (NEURONTIN ) 100 MG capsule Take 2 capsules (200 mg total) by mouth 3 (three) times daily.   ibuprofen (ADVIL) 200 MG tablet Take 200 mg by mouth every 8 (eight) hours as needed.   insulin  aspart (NOVOLOG  FLEXPEN) 100 UNIT/ML FlexPen Inject 20 Units into the skin 3 (three) times daily with meals.   Insulin  Degludec FlexTouch 200 UNIT/ML SOPN INJECT 100 UNITS SUBCUTANEOUSLY ONCE DAILY. PLEASE DISPENSE 30 DAY SUPPLY, PATIENT BRINGING IN 30-DAY VOUCHER/COUPON.   Insulin  Pen Needle 31G X 8 MM MISC Use as directed with Lantus .   metoprolol  succinate (TOPROL -XL) 50 MG 24 hr tablet Take 1 tablet (50 mg total) by mouth in the morning and at bedtime. Take with or immediately following a meal.   nitroGLYCERIN  (NITROSTAT ) 0.4 MG SL tablet Place 1 tablet (0.4 mg total) under the tongue every 5 (five) minutes as needed for chest pain.   ondansetron  (ZOFRAN -ODT) 4 MG disintegrating tablet Take 1 tablet (4 mg total) by mouth every 8 (eight) hours as needed for nausea or vomiting.   OVER THE COUNTER MEDICATION  Place 1 drop into both eyes 2 (two) times daily. Happy body  liquid msm eye drops/ for floaters in the eyes   rosuvastatin  (CRESTOR ) 10 MG tablet Take 1 tablet (10 mg total) by mouth daily.   sacubitril-valsartan (ENTRESTO ) 24-26 MG TAKE 1 TABLET BY MOUTH TWICE A DAY   Semaglutide , 2 MG/DOSE, (OZEMPIC , 2 MG/DOSE,) 8 MG/3ML SOPN Inject 2 mg into the skin once a week. for diabetes.   Spacer/Aero-Hold Chamber Bags MISC 1 Bag by Does not apply route as needed.   tamsulosin  (FLOMAX ) 0.4 MG CAPS capsule TAKE 1 CAPSULE (0.4 MG TOTAL) BY MOUTH DAILY. FOR URINE FLOW.   No facility-administered encounter medications on file as of 01/14/2024.    History: Past Medical History:  Diagnosis Date   Arthritis    fingers, shoulders (08/03/2015)   Atrial fibrillation (HCC)    Paroxysmal, rare episodes, sinus rhythm on flecainide , patient prefers not to take Coumadin   Bradycardia    April, 2013   Chest pain    Nuclear, 2006, no ischemia   Chronic lower back pain    all my life   Coronary artery disease    s/p staged cath 05/12/2014 and 5/11, DES x 2 to heavily calcified RCA, residual with 60% prox LAD, 70% D1   Ejection fraction    EF 55-60%,  echo, January, 2011   Elevated CPK    CPK elevated with normal MB and normal troponin the past   Heart  murmur    History of echocardiogram    Echo 8/16: EF 55%, normal wall motion, grade 2 diastolic dysfunction, trivial MR, normal RV function, PASP 27 mmHg   Hypertension    IBS (irritable bowel syndrome)    Myocardial infarction (HCC) 06/2014   NSTEMI (non-ST elevated myocardial infarction) (HCC) 08/02/2015   Sinus drainage    Type II diabetes mellitus (HCC)    Unstable angina (HCC) 01/05/2019   Past Surgical History:  Procedure Laterality Date   APPENDECTOMY     CARDIAC CATHETERIZATION N/A 05/12/2014   Procedure: Left Heart Cath and Coronary Angiography;  Surgeon: Debby DELENA Sor, MD;  Location: [facility] IN[facility]IVE [facility];  Service: Cardiovascular;   Laterality: N/A;   CARDIAC CATHETERIZATION N/A 05/13/2014   Procedure: Coronary/Graft Atherectomy;  Surgeon: Debby DELENA Sor, MD;  Location: [facility];  Service: Cardiovascular;  Laterality: N/A;   CARDIAC CATHETERIZATION Right 05/13/2014   Procedure: Temporary Pacemaker;  Surgeon: Debby DELENA Sor, MD;  Location: [facility];  Service: Cardiovascular;  Laterality: Right;   CARDIAC CATHETERIZATION N/A 05/13/2014   Procedure: Coronary Stent Intervention;  Surgeon: Debby DELENA Sor, MD;  Location: [facility];  Service: Cardiovascular;  Laterality: N/A;   CARDIAC CATHETERIZATION N/A 06/18/2014   Procedure: Left Heart Cath and Coronary Angiography;  Surgeon: Peter M Jordan, MD;  [facility] CV LAB;  Service: Cardiovascular;  Laterality: N/A;   CARDIAC CATHETERIZATION N/A 08/03/2015   Procedure: Left Heart Cath and Cors/Grafts Angiography;  Surgeon: Lonni Hanson, MD;  Location: [facility] INVASIVE CV LAB;  Service: Cardiovascular;  Laterality: N/A;   CARDIOVASCULAR STRESS TEST  05/06/2014   CARDIOVERSION N/A 02/21/2022   Procedure: CARDIOVERSION;  Surgeon: Hobart Powell BRAVO, MD;  Location: [facility] ENDOSCOPY;  Service: Cardiovascular;  Laterality: N/A;   CATARACT EXTRACTION W/ INTRAOCULAR LENS IMPLANT Left    COLONOSCOPY WITH PROPOFOL  N/A 02/13/2020   Procedure: COLONOSCOPY WITH PROPOFOL ;  Surgeon: Rollin Dover, MD;  Location: [facility] ENDOSCOPY;  Service: Endoscopy;  Laterality: N/A;   CORONARY ARTERY BYPASS GRAFT N/A 06/22/2014   Procedure: CORONARY ARTERY BYPASS GRAFTING (CABG) x five, using left internal mammary artery, and right leg greater saphenous vein harvested endoscopically;  Surgeon: Elspeth JAYSON Millers, MD;  Location: [facility] OR;  Service: Open Heart Surgery;  Laterality: N/A;   CORONARY STENT INTERVENTION N/A 07/03/2023   Procedure: CORONARY STENT INTERVENTION;  Surgeon: Elmira Newman PARAS, MD;  Location: [facility] CV [facility];  Service: Cardiovascular;  Laterality: N/A;    ICD IMPLANT N/A 01/07/2019   Procedure: ICD IMPLANT;  Surgeon: Kelsie Agent, MD;  Location: [facility];  Service: Cardiovascular;  Laterality: N/A;   LAPAROSCOPIC CHOLECYSTECTOMY     LEFT HEART CATH AND CORS/GRAFTS ANGIOGRAPHY N/A 01/06/2019   Procedure: LEFT HEART CATH AND CORS/GRAFTS ANGIOGRAPHY;  Surgeon: Hanson Lonni, MD;  Location: [facility];  Service: Cardiovascular;  Laterality: N/A;   LEFT HEART CATH AND CORS/GRAFTS ANGIOGRAPHY N/A 07/03/2023   Procedure: LEFT HEART CATH AND CORS/GRAFTS ANGIOGRAPHY;  Surgeon: Elmira Newman PARAS, MD;  Location: [facility];  Service: Cardiovascular;  Laterality: N/A;   MAZE N/A 06/22/2014   Procedure: MAZE;  Surgeon: Elspeth JAYSON Millers, MD;  Location[facility] OR;  Service: Open Heart Surgery;  Laterality: N/A;   POLYPECTOMY  02/13/2020   Procedure: POLYPECTOMY;  Surgeon: Rollin Dover, MD;  Location: [facility] ENDOSCOPY;  Service: Endoscopy;;   TEE WITHOUT CARDIOVERSION N/A 06/22/2014   Procedure: TRANSESOPHAGEAL ECHOCARDIOGRAM (TEE);  Surgeon: Elspeth JAYSON Millers, MD;  Location: [facility] OR;  Service: Open Heart Surgery;  Laterality: N/A;   Family History  Problem Relation Age of Onset   Alzheimer's disease Mother    Emphysema Father    Cancer Brother    Arrhythmia Sister    Heart attack Sister    Heart disease Sister    Hyperlipidemia Sister    Hypertension Sister    Social History   Occupational History   Not on file  Tobacco Use   Smoking status: Never   Smokeless tobacco: Never  Vaping Use   Vaping status: Never Used  Substance and Sexual Activity   Alcohol use: No   Drug use: No   Sexual activity: Not Currently   Tobacco Counseling Counseling given: No  SDOH Screenings   Food Insecurity: No Food Insecurity (01/14/2024)  Housing: Unknown (01/14/2024)  Transportation Needs: No Transportation Needs (01/14/2024)  Utilities: Not At Risk (01/14/2024)  Alcohol Screen: Low Risk (01/10/2023)  Depression (PHQ2-9): Low  Risk (01/14/2024)  Financial Resource Strain: Low Risk (01/10/2023)  Physical Activity: Inactive (01/14/2024)  Social Connections: Socially Integrated (01/14/2024)  Stress: No Stress Concern Present (01/14/2024)  Tobacco Use: Low Risk (01/14/2024)  Recent Concern: Tobacco Use - High Risk (10/18/2023)  Health Literacy: Adequate Health Literacy (01/14/2024)   See flowsheets for full screening details  Depression Screen PHQ 2 & 9 Depression Scale- Over the past 2 weeks, how often have you been bothered by any of the following problems? Little interest or pleasure in doing things: 0 Feeling down, depressed, or hopeless (PHQ Adolescent also includes...irritable): 0 PHQ-2 Total Score: 0 Trouble falling or staying asleep, or sleeping too much: 0 Feeling tired or having little energy: 0 Poor appetite or overeating (PHQ Adolescent also includes...weight loss): 0 Feeling bad about yourself - or that you are a failure or have let yourself or your family down: 0 Trouble concentrating on things, such as reading the newspaper or watching television (PHQ Adolescent also includes...like school work): 0 Moving or speaking so slowly that other people could have noticed. Or the opposite - being so fidgety or restless that you have been moving around a lot more than usual: 3 (bilateral hip pain) Thoughts that you would be better off dead, or of hurting yourself in some way: 0 PHQ-9 Total Score: 3 If you checked off any problems, how difficult have these problems made it for you to do your work, take care of things at home, or get along with other people?: Not difficult at all  Depression Treatment Depression Interventions/Treatment : EYV7-0 Score <4 Follow-up Not Indicated; Medication; Currently on Treatment     Goals Addressed               This Visit's Progress     Patient Stated (pt-stated)        Patient stated he plans to manage his bilateral hip pain and get healthier             Objective:     Today's Vitals   01/14/24 1547  Weight: 210 lb (95.3 kg)  Height: 5' 8 (1.727 m)   Body mass index is 31.93 kg/m.  Hearing/Vision screen Hearing Screening - Comments:: Wears hearing aids Vision Screening - Comments:: Wears rx glasses - up to date with routine eye exams with Ginnie Pinal Immunizations and Health Maintenance Health Maintenance  Topic Date Due   OPHTHALMOLOGY EXAM  11/25/2020   COVID-19 Vaccine (4 - 2025-26 season) 09/03/2023   HEMOGLOBIN A1C  05/15/2024   Diabetic kidney evaluation -  eGFR measurement  07/09/2024   Diabetic kidney evaluation - Urine ACR  08/07/2024   FOOT EXAM  08/07/2024   Medicare Annual Wellness (AWV)  01/13/2025   Pneumococcal Vaccine: 50+ Years  Completed   Influenza Vaccine  Completed   Hepatitis C Screening  Completed   Zoster Vaccines- Shingrix  Completed   Meningococcal B Vaccine  Aged Out   DTaP/Tdap/Td  Discontinued   Colonoscopy  Discontinued        Assessment/Plan:  This is a routine wellness examination for [name].  Patient Care Team: Gretta Comer POUR, NP as PCP - General (Internal Medicine) Cindie Ole DASEN, MD (Inactive) as PCP - Electrophysiology (Cardiology) Jeffrie Oneil BROCKS, MD as PCP - Cardiology (Cardiology) Cleotilde Sewer, OD (Optometry) Geronimo Manuelita SAUNDERS, RPH-CPP (Pharmacist) Lelon Glendia DASEN DEVONNA as Physician Assistant (Cardiology) Geary Been, Student-PA as Student Physician's Assistant (Student)  I have personally reviewed and noted the following in the patients chart:   Medical and social history Use of alcohol, tobacco or illicit drugs  Current medications and supplements including opioid prescriptions. Functional ability and status Nutritional status Physical activity Advanced directives List of other physicians Hospitalizations, surgeries, and ER visits in previous 12 months Vitals Screenings to include cognitive, depression, and falls Referrals and appointments  No orders of the defined  types were placed in this encounter.  In addition, I have reviewed and discussed with patient certain preventive protocols, quality metrics, and best practice recommendations. A written personalized care plan for preventive services as well as general preventive health recommendations were provided to patient.   Verdie CHRISTELLA Saba, CMA   01/14/2024   Return in 1 year (on 01/13/2025).  After Visit Summary: (MyChart) Due to this being a telephonic visit, the after visit summary with patients personalized plan was offered to patient via MyChart   Nurse Notes: scheduled 2027 appt "

## 2024-01-14 NOTE — Patient Instructions (Addendum)
 [name],  Thank you for taking the time for your Medicare Wellness Visit. I appreciate your continued commitment to your health goals. Please review the care plan we discussed, and feel free to reach out if I can assist you further.  Please note that Annual Wellness Visits do not include a physical exam. Some assessments may be limited, especially if the visit was conducted virtually. If needed, we may recommend an in-person follow-up with your provider.  Ongoing Care Seeing your primary care provider every 3 to 6 months helps us  monitor your health and provide consistent, personalized care.   Referrals If a referral was made during today's visit and you haven't received any updates within two weeks, please contact the referred provider directly to check on the status.  Recommended Screenings:  Health Maintenance  Topic Date Due   Eye exam for diabetics  11/25/2020   COVID-19 Vaccine (4 - 2025-26 season) 09/03/2023   Hemoglobin A1C  05/15/2024   Yearly kidney function blood test for diabetes  07/09/2024   Yearly kidney health urinalysis for diabetes  08/07/2024   Complete foot exam   08/07/2024   Medicare Annual Wellness Visit  01/13/2025   Pneumococcal Vaccine for age over 87  Completed   Flu Shot  Completed   Hepatitis C Screening  Completed   Zoster (Shingles) Vaccine  Completed   Meningitis B Vaccine  Aged Out   DTaP/Tdap/Td vaccine  Discontinued   Colon Cancer Screening  Discontinued       01/14/2024    3:49 PM  Advanced Directives  Does Patient Have a Medical Advance Directive? Yes;No  Does patient want to make changes to medical advance directive? No - Guardian declined  Would patient like information on creating a medical advance directive? No - Patient declined    Vision: Annual vision screenings are recommended for early detection of glaucoma, cataracts, and diabetic retinopathy. These exams can also reveal signs of chronic conditions such as diabetes and high  blood pressure.  Dental: Annual dental screenings help detect early signs of oral cancer, gum disease, and other conditions linked to overall health, including heart disease and diabetes.

## 2024-01-18 ENCOUNTER — Telehealth: Payer: Self-pay

## 2024-01-18 NOTE — Telephone Encounter (Signed)
 Patient in need of assistance or alternative for this order. Please advise.    Copied from CRM [id number]. Topic: Clinical - Prescription Issue >> Jan 18, 2024  2:56 PM Jayma L wrote: Reason for CRM: patient called in stated he was having a hard time affording Continuous Glucose Sensor (FREESTYLE LIBRE 3 PLUS SENSOR) MISC asking if there is any other options said insurance wont pay for it anymore . Please reach him back at [phone]

## 2024-01-19 ENCOUNTER — Other Ambulatory Visit: Payer: Self-pay | Admitting: Primary Care

## 2024-01-19 DIAGNOSIS — E1122 Type 2 diabetes mellitus with diabetic chronic kidney disease: Secondary | ICD-10-CM

## 2024-01-19 MED ORDER — DEXCOM G7 SENSOR MISC: 3 refills | Status: AC

## 2024-01-19 MED ORDER — DEXCOM G7 RECEIVER DEVI: 3 refills | Status: AC

## 2024-01-19 NOTE — Telephone Encounter (Signed)
 We can try Dexcom which is similar.   I will send a prescription for the Dexcom sensors and receiver device to see if insurance will cover.   Have him update us  if they will not cover.

## 2024-01-21 NOTE — Telephone Encounter (Signed)
Tried to call patient but no answer and VM is full.  

## 2024-01-23 ENCOUNTER — Other Ambulatory Visit

## 2024-01-23 DIAGNOSIS — E1122 Type 2 diabetes mellitus with diabetic chronic kidney disease: Secondary | ICD-10-CM

## 2024-01-23 NOTE — Progress Notes (Incomplete)
 Pre-Chart Notes:  Diabetes: Labs/BG Assessment: *** Trulicity  ***PAP update *** Started Trulicity ? *** Trulicity  toleration? *** Novolog  *** PAP? From 1/7 pt reports large supply *** check to see if he has enough novolog  Takes 20 units before meals. Takes it 15 minutes before meals  May miss one dose a week *** Tresiba  *** enough Tresiba ? Taking 100 units Not muhc left. Another box left  Adjusting how much he takes. Will sometimes take Two days a week taking the tresiba  (will do 100 at lunch and 40 at dinner if at supper if BG still high) Yesterday took it twice per day >> may explain why he expeirienced lows *** Dexcom G7 (theoretically) *** confirm if using that w/ reader If has a smart phone then can try to hook up to clinic >> Motorolla Edge 22 - not compatible with his current phone  *** Libre 3+: look into if his insurance will cover On his last sensor 10 Days left  Insurance [employer]  Farxiga  - almost out. 10 tablets left BG: Overnight lows >> corrects it with Oranje juice or candy bars Might get dizzy with  Diet: Drinking a protein drink Plan Discussed taking Tresiba  twice dialy. Recommended taking taking it once per day. Pt reported get low BG alarms, which correlates to days he takes Tresiba  twice per day. Discussed once a day dosing and will increase tresiba  to 110 units per day. Keep Novolog  the same  Does not want to use decom as it is not compatibel with his phone motorol edge 22 Will look into Libre3+ (previous c/f cost concerns_ but pt reports dexcom did not cost a thing atpharmacy. Black pen = tresiba  (heads up that he knows it as this for following conversations) Red pen = novolog  (heads up that he knows it as this for following conversations) Follow Up Notes: Probs hasn't got Trulicity  yet Has a hard time differentiating between novolog  and tresiba     01/23/2024 Name: [name] MRN: [id number] DOB: 1945-09-18  Subjective  No  chief complaint on file.   Reason for visit: ?  [name] is a 79 y.o. male with a history of diabetes (type 2), who presents today for an initial diabetes/CKD pharmacotherapy visit.? Pertinent PMH also includes PAF w/ICD, HTN, CAD s/p CABG, ischemic cardiomyopathy, HFrEF, NASH, BPH, HLD.   Known DM Complications: retinopathy, NASH  Care Team: Primary Care Provider: Gretta Comer POUR, NP   Date of Last Diabetes Related Visit: with Pharmacist on 01/09/24  Recent Summary of Change: ***  Medication Access/Adherence: Prescription drug coverage: Payor: [employer] MEDICARE / Plan: [facility] MEDICARE / Product Type: *No Product type* / .  - Reports that all medications are *** affordable.  - Current Patient Assistance: None*** - Medication Adherence: Patient denies*** missing doses of their medication.   - *** goal is start Trulicity   Since Last visit / History of Present Illness: ?  Patient reports implementing*** plan from last visit. Denies adverse effects with titration of ***.   Reported DM Regimen: ?  Farxiga  10 mg daily Novolog  20 units TIDAC Does adjust dose periodically - ex. 26 units if BG is 250+ prior to meal Tresiba  U200 100 units daily (does not self-adjust) Ozempic  1.0 mg weekly (*** may no longer be taking d/t Novo not offering support in 2026    DM medications tried in the past:?  *** ({ReasonforDiscontinuation:34588}) ***  ({ReasonforDiscontinuation:34588}) ***  ({ReasonforDiscontinuation:34588})  SMBG: *** ?  ***   Hypo/Hyperglycemia: ?  Symptoms of hypoglycemia  since last visit:? {Blank multiple:19197::yes,no,n/a,***}  Symptoms of hyperglycemia since last visit:? {Blank multiple:19197::yes,no,n/a,***} - {symptoms; hyperglycemia:17903}  Reported Diet: Patient typically eats 3 meals per day.  Breakfast: sausage egg and chees biscuit bacon. Makes it at home Lunch: Some days may be higher some may be lower based on how big dinner will be >>  burger, sandwich  Dinner: [employer] chicken and rice bowl Twice a week for out to eat Snacks: chips, m&ms Beverages: diet pepsi with every supper  Lost 3-4 lbs  Exercise: Keep grandkids during the week so watches and plays with them   DM Prevention:  Statin: {Blank single:19197::***,Taking,Not taking,Intolerant to,Declines}; {Blank single:19197::low intensity,moderate intensity,high intensity,n/a}.?  History of chronic kidney disease? {Blank single:19197::yes,no} History of albuminuria? {Blank single:19197::yes,no}, last UACR on *** = *** mg/g Lab Results  Component Value Date   MICRALBCREAT 15.3 08/08/2023   ACE/ARB - {Blank single:19197::***,Taking,Not taking,Intolerant to,Declines} ***; Urine MA/CR Ratio - {Blank single:19197::<30 mg/g,elevated,n/a,***}.  Last eye exam:  Lab Results  Component Value Date   HMDIABEYEEXA Retinopathy (A) 11/26/2019   Last foot exam: 08/08/2023 Tobacco Use:  Tobacco Use: Low Risk (01/14/2024)   Patient History    Smoking Tobacco Use: Never    Smokeless Tobacco Use: Never    Passive Exposure: Not on file  Recent Concern: Tobacco Use - High Risk (10/18/2023)   Patient History    Smoking Tobacco Use: Some Days    Smokeless Tobacco Use: Never    Passive Exposure: Not on file   Immunizations:? Flu: {LZDUEUPTODATE:31033} (Last: 09/21/2023); Pneumococcal: {LZVAXpneumococcal:31032:::0} - {LZVAXpneumo RISKAge19-64:31093}; Shingrix: {LZDUEUPTODATE:31033} (Last: 12/18/2018, 09/21/2018); Covid: {LZDUEUPTODATE:31033} (Last:10/28/2019); TDap: {LZDUEUPTODATE:31033} (Last: 01/16/2011)  Cardiovascular Risk Reduction History of clinical ASCVD? {Blank single:19197::yes,no} The ASCVD Risk score (Arnett DK, et al., 2019) failed to calculate for the following reasons:   Risk score cannot be calculated because patient has a medical history suggesting prior/existing ASCVD   * - Cholesterol units were assumed History  of heart failure? {Blank single:19197::yes,no} History of hyperlipidemia? {Blank single:19197::yes,no} Current BMI: *** kg/m2 (Ht *** in, Wt *** kg); Estimated body mass index is 31.93 kg/m as calculated from the following:   Height as of 01/14/24: 5' 8 (1.727 m).   Weight as of 01/14/24: 95.3 kg (210 lb).  Taking statin? {Blank single:19197::yes,no,intolerant,declines}; {Blank single:19197::low intensity,moderate intensity,high intensity,n/a} (***) Taking aspirin ? {Blank single:19197::indicated (primary prevention),indicated (secondary prevention),not indicated,unclear if indicated}; {Blank single:19197::***,Taking,Not taking,Intolerant to,Declines}   Taking SGLT-2i? {Blank single:19197::yes,no} Taking GLP- 1 RA? {Blank single:19197::yes,no}      _______________________________________________  Objective    Review of Systems:? Limited in the setting of virtual visit *** Constitutional:? No fever, chills or unintentional weight loss  Cardiovascular:? No chest pain or pressure, shortness of breath, dyspnea on exertion, orthopnea or LE edema  Pulmonary:? No cough or shortness of breath  GI:? No nausea, vomiting, constipation, diarrhea, abdominal pain, dyspepsia, change in bowel habits  Endocrine:? No polyuria, polyphagia or blurred vision   {CGM reports:33570} 7 day average blood glucose: ***  Libre3 CGM Download today *** % Time CGM is active: ***% Average Glucose: *** mg/dL Glucose Management Indicator: ***  Glucose Variability: ***% (goal <36%) Time in Goal:  - Time in range 70-180: ***% - Time above range: ***% - Time below range: ***% Observed patterns:   Physical Examination:  Vitals:  Wt Readings from Last 3 Encounters:  01/14/24 95.3 kg (210 lb)  01/09/24 95.3 kg (210 lb)  01/08/24 95.9 kg (211 lb 6 oz)   BP Readings from Last 3 Encounters:  01/09/24 127/74  01/08/24 130/74  11/16/23 132/68   Pulse Readings from  Last 3 Encounters:  01/09/24 80  11/16/23 67  11/12/23 75     Labs:?  Lab Results  Component Value Date   HGBA1C 9.2 (A) 11/16/2023   HGBA1C 8.9 (A) 08/08/2023   HGBA1C 9.5 (H) 04/10/2023   GLUCOSE 157 (H) 07/10/2023   MICRALBCREAT 15.3 08/08/2023   CREATININE 1.01 07/10/2023   CREATININE 1.44 (H) 06/29/2023   CREATININE 1.01 06/24/2023   GFR 53.87 (L) 11/30/2021   GFR 75.44 12/07/2020   GFR 59.59 (L) 11/26/2019    Lab Results  Component Value Date   CHOL 185 04/10/2023   LDLCALC 127 (H) 04/10/2023   LDLCALC 125 (H) 05/04/2021   LDLCALC 75 12/07/2020   HDL 42.40 04/10/2023   TRIG 79.0 04/10/2023   TRIG 35 05/04/2021   TRIG 60.0 12/07/2020   ALT 70 (H) 06/24/2023   ALT 24 02/28/2023   AST 37 06/24/2023   AST 22 02/28/2023      Chemistry      Component Value Date/Time   NA 144 07/10/2023 1639   K 4.1 07/10/2023 1639   CL 104 07/10/2023 1639   CO2 18 (L) 07/10/2023 1639   BUN 19 07/10/2023 1639   CREATININE 1.01 07/10/2023 1639   CREATININE 1.00 08/12/2015 0934      Component Value Date/Time   CALCIUM  9.0 07/10/2023 1639   ALKPHOS 73 06/24/2023 1137   AST 37 06/24/2023 1137   ALT 70 (H) 06/24/2023 1137   BILITOT 0.9 06/24/2023 1137   BILITOT 0.4 02/28/2023 0942       The ASCVD Risk score (Arnett DK, et al., 2019) failed to calculate for the following reasons:   Risk score cannot be calculated because patient has a medical history suggesting prior/existing ASCVD   * - Cholesterol units were assumed  Assessment and Plan:   1. Diabetes, {Blank single:19197::type 1,type 2,LADA,MODY,post-transplant}: {CHL Controlled/Uncontrolled:[phone]} per last A1c of ***% (***), {Increased/decreased/na:15831} from previous, ***%. Goal <7% without hypoglycemia.  {lzcgmsmbg:31042} data shows ***?. Current Regimen: *** Diet: *** Exercise: ***  HCM: {LZ HCM DM2:31604} Continue medications today without changes.  ***  Reviewed s/sx/tx hypoglycemia Future  Consideration: GLP1-RA: ***. Hx negative for pancreatitis, personal/fam hx medullary thyroid  carcinoma or MEN2.  DPP4i: *** SGLT2i: *** Metformin : {LZ AP metformin :31605}; eGFR cannot be calculated (Patient's most recent lab result is older than the maximum 21 days allowed.).  SU: Not unreasonable, though defer given alternative options with lower risk of hypoglycemia.  TZD: Avoiding due to possible weight gain/increase in fracture risk. ***CHF Insulin : Not unreasonable, though defer as last resort or as needed for severe hyperglycemia given risk hypoglycemia. Other safer options at this time.  Insulin : ***    2. HTN: {CHL Controlled/Uncontrolled:[phone]} based on last clinic BP of *** mmHg (***), goal <130/80 mmHg. Does not *** monitor BP at home. Denies lightheadedness, dizziness, SOB, CP, vision changes.  Current Regimen: *** Continue medications without changes.  Future Consideration: ACEi/ARB: *** CCB: *** Thiazide diuretic: *** MRA: *** Beta-Blocker: *** Hydralazine : *** BP Readings from Last 3 Encounters:  01/09/24 127/74  01/08/24 130/74  11/16/23 132/68     3. ASCVD (*** prevention): {CHL Controlled/Uncontrolled:[phone]} on last lipid panel with LDL *** mg/dL, TG *** mg/dL (***). LDL goal {ldl goal:31310}.  Key risk factors: {cvs risk:31001} The ASCVD Risk score (Arnett DK, et al., 2019) failed to calculate for the following reasons:   Risk score cannot be calculated because patient has a medical history  suggesting prior/existing ASCVD   * - Cholesterol units were assumed indicates patient is at *** risk.  PREVENT: 10-Yr CAD ***%, 30-Yr CAD ***% indicates patient is at *** risk.  Current Regimen: *** mg daily Continue medications today without changes.  Future Consideration: Statin: *** Ezetimibe : *** PCSK9i: ***  Lab Results  Component Value Date   LDLCALC 127 (H) 04/10/2023   LDLCALC 125 (H) 05/04/2021   TRIG 79.0 04/10/2023   TRIG 35 05/04/2021      4. Healthcare Maintenance:  Pneumococcal - Current status: ***  Shingles - Current status: *** Influenza - Current status: ***  Due to receive the following vaccines: {LZAPIMMUNIZATION:31028}   Follow Up Follow up with clinical pharmacist via *** in *** weeks Patient given direct line for questions regarding medication therapy  Future Appointments  Date Time Provider Department Center  01/23/2024  1:00 PM [facility] PHARMACIST [facility] [address]  02/15/2024  7:40 AM Gretta Comer POUR, NP [facility] [address]  04/07/2024  7:45 AM [facility] DEVICE REMOTES [facility]  01/14/2025  2:20 PM [facility] ANNUAL WELLNESS VISIT 1 [facility] [address]    Prentice DOROTHA Favors, PharmD PGY1 [facility]-System Pharmacy Administration and Leadership Resident [facility]

## 2024-01-23 NOTE — Telephone Encounter (Signed)
 Attempted to contact pt. No answer, vm box full. Pls relay Kate's message concerning Dexcom G7 sensors.

## 2024-01-24 NOTE — Telephone Encounter (Signed)
 Attempted to contact pt. No answer, vm box full. Pls relay Kate's message concerning Dexcom G7 sensors.   Mailing a letter.

## 2024-01-25 NOTE — Telephone Encounter (Signed)
 Received approval letter from [employer] (Novolog ) thru 01/01/2025,approval letter index.

## 2024-02-15 ENCOUNTER — Ambulatory Visit: Admitting: Primary Care

## 2025-01-14 ENCOUNTER — Ambulatory Visit
